# Patient Record
Sex: Female | Born: 1935 | Race: White | Hispanic: No | Marital: Married | Smoking: Former smoker
Health system: Southern US, Community
[De-identification: ages and names within clinical notes are randomized; demographics above are authoritative.]

## PROBLEM LIST (undated history)

## (undated) DIAGNOSIS — C50919 Malignant neoplasm of unspecified site of unspecified female breast: Secondary | ICD-10-CM

## (undated) DIAGNOSIS — R498 Other voice and resonance disorders: Secondary | ICD-10-CM

## (undated) DIAGNOSIS — R739 Hyperglycemia, unspecified: Secondary | ICD-10-CM

## (undated) DIAGNOSIS — R251 Tremor, unspecified: Secondary | ICD-10-CM

## (undated) DIAGNOSIS — I10 Essential (primary) hypertension: Secondary | ICD-10-CM

## (undated) DIAGNOSIS — D649 Anemia, unspecified: Secondary | ICD-10-CM

## (undated) DIAGNOSIS — R42 Dizziness and giddiness: Secondary | ICD-10-CM

## (undated) DIAGNOSIS — I951 Orthostatic hypotension: Secondary | ICD-10-CM

## (undated) DIAGNOSIS — E039 Hypothyroidism, unspecified: Secondary | ICD-10-CM

## (undated) DIAGNOSIS — E785 Hyperlipidemia, unspecified: Secondary | ICD-10-CM

## (undated) DIAGNOSIS — R7303 Prediabetes: Secondary | ICD-10-CM

## (undated) DIAGNOSIS — I519 Heart disease, unspecified: Secondary | ICD-10-CM

## (undated) DIAGNOSIS — G459 Transient cerebral ischemic attack, unspecified: Secondary | ICD-10-CM

## (undated) DIAGNOSIS — N19 Unspecified kidney failure: Secondary | ICD-10-CM

## (undated) HISTORY — PX: CATARACT EXTRACTION: SUR2

## (undated) HISTORY — PX: TONSILLECTOMY: SUR1361

## (undated) HISTORY — DX: Other voice and resonance disorders: R49.8

## (undated) HISTORY — DX: Hyperlipidemia, unspecified: E78.5

## (undated) HISTORY — DX: Malignant neoplasm of unspecified site of unspecified female breast: C50.919

## (undated) HISTORY — DX: Transient cerebral ischemic attack, unspecified: G45.9

## (undated) HISTORY — DX: Orthostatic hypotension: I95.1

## (undated) HISTORY — DX: Hyperglycemia, unspecified: R73.9

## (undated) HISTORY — DX: Dizziness and giddiness: R42

## (undated) HISTORY — DX: Anemia, unspecified: D64.9

## (undated) HISTORY — DX: Unspecified kidney failure: N19

## (undated) HISTORY — DX: Hypothyroidism, unspecified: E03.9

## (undated) HISTORY — DX: Heart disease, unspecified: I51.9

## (undated) HISTORY — DX: Tremor, unspecified: R25.1

## (undated) HISTORY — PX: NASAL SINUS SURGERY: SHX719

## (undated) HISTORY — DX: Essential (primary) hypertension: I10

---

## 1992-03-08 HISTORY — PX: CHOLECYSTECTOMY: SHX55

## 1997-09-19 ENCOUNTER — Other Ambulatory Visit: Admission: RE | Admit: 1997-09-19 | Discharge: 1997-09-19 | Payer: Self-pay | Admitting: Obstetrics and Gynecology

## 1997-11-21 ENCOUNTER — Emergency Department (HOSPITAL_COMMUNITY): Admission: EM | Admit: 1997-11-21 | Discharge: 1997-11-21 | Payer: Self-pay | Admitting: Emergency Medicine

## 1997-11-24 ENCOUNTER — Emergency Department (HOSPITAL_COMMUNITY): Admission: EM | Admit: 1997-11-24 | Discharge: 1997-11-24 | Payer: Self-pay | Admitting: Emergency Medicine

## 1997-11-27 ENCOUNTER — Encounter: Payer: Self-pay | Admitting: Cardiology

## 1997-11-27 ENCOUNTER — Inpatient Hospital Stay (HOSPITAL_COMMUNITY): Admission: AD | Admit: 1997-11-27 | Discharge: 1997-11-29 | Payer: Self-pay | Admitting: Cardiology

## 1997-12-03 ENCOUNTER — Ambulatory Visit (HOSPITAL_COMMUNITY): Admission: RE | Admit: 1997-12-03 | Discharge: 1997-12-03 | Payer: Self-pay | Admitting: Unknown Physician Specialty

## 1997-12-05 ENCOUNTER — Encounter: Payer: Self-pay | Admitting: Cardiology

## 1997-12-05 ENCOUNTER — Ambulatory Visit (HOSPITAL_COMMUNITY): Admission: RE | Admit: 1997-12-05 | Discharge: 1997-12-05 | Payer: Self-pay | Admitting: Cardiology

## 1998-11-25 ENCOUNTER — Other Ambulatory Visit: Admission: RE | Admit: 1998-11-25 | Discharge: 1998-11-25 | Payer: Self-pay | Admitting: Obstetrics and Gynecology

## 1999-01-01 ENCOUNTER — Ambulatory Visit (HOSPITAL_COMMUNITY): Admission: RE | Admit: 1999-01-01 | Discharge: 1999-01-01 | Payer: Self-pay | Admitting: *Deleted

## 1999-01-01 ENCOUNTER — Encounter (INDEPENDENT_AMBULATORY_CARE_PROVIDER_SITE_OTHER): Payer: Self-pay

## 1999-12-15 ENCOUNTER — Other Ambulatory Visit: Admission: RE | Admit: 1999-12-15 | Discharge: 1999-12-15 | Payer: Self-pay | Admitting: Obstetrics and Gynecology

## 2001-01-19 ENCOUNTER — Other Ambulatory Visit: Admission: RE | Admit: 2001-01-19 | Discharge: 2001-01-19 | Payer: Self-pay | Admitting: Obstetrics and Gynecology

## 2002-09-11 ENCOUNTER — Other Ambulatory Visit: Admission: RE | Admit: 2002-09-11 | Discharge: 2002-09-11 | Payer: Self-pay | Admitting: Obstetrics and Gynecology

## 2002-09-19 ENCOUNTER — Ambulatory Visit (HOSPITAL_COMMUNITY): Admission: RE | Admit: 2002-09-19 | Discharge: 2002-09-19 | Payer: Self-pay | Admitting: *Deleted

## 2004-07-15 ENCOUNTER — Other Ambulatory Visit: Admission: RE | Admit: 2004-07-15 | Discharge: 2004-07-15 | Payer: Self-pay | Admitting: Obstetrics and Gynecology

## 2007-06-07 HISTORY — PX: CARDIOVASCULAR STRESS TEST: SHX262

## 2008-03-08 HISTORY — PX: BREAST LUMPECTOMY: SHX2

## 2008-04-16 ENCOUNTER — Ambulatory Visit: Payer: Self-pay | Admitting: Hematology & Oncology

## 2008-04-22 ENCOUNTER — Encounter: Admission: RE | Admit: 2008-04-22 | Discharge: 2008-04-22 | Payer: Self-pay | Admitting: General Surgery

## 2008-04-23 ENCOUNTER — Ambulatory Visit: Admission: RE | Admit: 2008-04-23 | Discharge: 2008-06-10 | Payer: Self-pay | Admitting: Radiation Oncology

## 2008-05-16 ENCOUNTER — Encounter: Admission: RE | Admit: 2008-05-16 | Discharge: 2008-05-16 | Payer: Self-pay | Admitting: General Surgery

## 2008-05-20 ENCOUNTER — Ambulatory Visit (HOSPITAL_BASED_OUTPATIENT_CLINIC_OR_DEPARTMENT_OTHER): Admission: RE | Admit: 2008-05-20 | Discharge: 2008-05-20 | Payer: Self-pay | Admitting: General Surgery

## 2008-05-20 ENCOUNTER — Encounter (INDEPENDENT_AMBULATORY_CARE_PROVIDER_SITE_OTHER): Payer: Self-pay | Admitting: General Surgery

## 2008-06-26 ENCOUNTER — Ambulatory Visit: Payer: Self-pay | Admitting: Oncology

## 2008-06-26 LAB — CBC WITH DIFFERENTIAL/PLATELET
Basophils Absolute: 0 10*3/uL (ref 0.0–0.1)
Eosinophils Absolute: 0.1 10*3/uL (ref 0.0–0.5)
HGB: 12.2 g/dL (ref 11.6–15.9)
LYMPH%: 35.6 % (ref 14.0–49.7)
MCV: 98.7 fL (ref 79.5–101.0)
MONO%: 13.8 % (ref 0.0–14.0)
NEUT#: 2.8 10*3/uL (ref 1.5–6.5)
Platelets: 146 10*3/uL (ref 145–400)

## 2008-06-26 LAB — COMPREHENSIVE METABOLIC PANEL
Albumin: 4.6 g/dL (ref 3.5–5.2)
Alkaline Phosphatase: 52 U/L (ref 39–117)
BUN: 30 mg/dL — ABNORMAL HIGH (ref 6–23)
Glucose, Bld: 110 mg/dL — ABNORMAL HIGH (ref 70–99)
Total Bilirubin: 0.2 mg/dL — ABNORMAL LOW (ref 0.3–1.2)

## 2008-06-26 LAB — CANCER ANTIGEN 27.29: CA 27.29: 38 U/mL (ref 0–39)

## 2008-06-28 ENCOUNTER — Ambulatory Visit: Admission: RE | Admit: 2008-06-28 | Discharge: 2008-08-13 | Payer: Self-pay | Admitting: Radiation Oncology

## 2009-01-06 ENCOUNTER — Encounter: Admission: RE | Admit: 2009-01-06 | Discharge: 2009-03-05 | Payer: Self-pay | Admitting: Cardiology

## 2009-05-15 ENCOUNTER — Ambulatory Visit: Payer: Self-pay | Admitting: Oncology

## 2009-11-25 ENCOUNTER — Ambulatory Visit: Admission: RE | Admit: 2009-11-25 | Discharge: 2009-12-01 | Payer: Self-pay | Admitting: Radiation Oncology

## 2009-12-09 ENCOUNTER — Ambulatory Visit: Payer: Self-pay | Admitting: Cardiology

## 2010-04-23 ENCOUNTER — Ambulatory Visit (INDEPENDENT_AMBULATORY_CARE_PROVIDER_SITE_OTHER): Payer: 59 | Admitting: Cardiology

## 2010-04-23 DIAGNOSIS — I119 Hypertensive heart disease without heart failure: Secondary | ICD-10-CM

## 2010-04-23 DIAGNOSIS — G219 Secondary parkinsonism, unspecified: Secondary | ICD-10-CM

## 2010-04-23 DIAGNOSIS — E039 Hypothyroidism, unspecified: Secondary | ICD-10-CM

## 2010-04-23 DIAGNOSIS — E7800 Pure hypercholesterolemia, unspecified: Secondary | ICD-10-CM

## 2010-06-18 LAB — DIFFERENTIAL
Basophils Relative: 0 % (ref 0–1)
Lymphocytes Relative: 28 % (ref 12–46)
Lymphs Abs: 1.8 10*3/uL (ref 0.7–4.0)
Monocytes Relative: 14 % — ABNORMAL HIGH (ref 3–12)
Neutro Abs: 3.7 10*3/uL (ref 1.7–7.7)
Neutrophils Relative %: 56 % (ref 43–77)

## 2010-06-18 LAB — COMPREHENSIVE METABOLIC PANEL
BUN: 25 mg/dL — ABNORMAL HIGH (ref 6–23)
Calcium: 9.6 mg/dL (ref 8.4–10.5)
Creatinine, Ser: 1.1 mg/dL (ref 0.4–1.2)
Glucose, Bld: 81 mg/dL (ref 70–99)
Total Protein: 7.2 g/dL (ref 6.0–8.3)

## 2010-06-18 LAB — URINALYSIS, ROUTINE W REFLEX MICROSCOPIC
Bilirubin Urine: NEGATIVE
Glucose, UA: NEGATIVE mg/dL
Hgb urine dipstick: NEGATIVE
Ketones, ur: NEGATIVE mg/dL
Nitrite: NEGATIVE
Protein, ur: NEGATIVE mg/dL
Specific Gravity, Urine: 1.015 (ref 1.005–1.030)
Urobilinogen, UA: 0.2 mg/dL (ref 0.0–1.0)
pH: 7 (ref 5.0–8.0)

## 2010-06-18 LAB — CBC
HCT: 35.5 % — ABNORMAL LOW (ref 36.0–46.0)
Hemoglobin: 12.4 g/dL (ref 12.0–15.0)
MCHC: 35 g/dL (ref 30.0–36.0)
Platelets: 161 10*3/uL (ref 150–400)
RDW: 14.1 % (ref 11.5–15.5)

## 2010-06-18 LAB — URINE MICROSCOPIC-ADD ON

## 2010-07-21 NOTE — Op Note (Signed)
Victoria Lindsey, Victoria Lindsey                ACCOUNT NO.:  [id number]   MEDICAL RECORD NO.:  [id number]          PATIENT TYPE:  AMB   LOCATION:  DSC                          FACILITY:  [facility]   PHYSICIAN:  Angelia Mould. Derrell Lolling, M.D.DATE OF BIRTH:  1926-01-29   DATE OF PROCEDURE:  05/20/2008  DATE OF DISCHARGE:                               OPERATIVE REPORT   PREOPERATIVE DIAGNOSIS:  Invasive ductal carcinoma, left breast, upper  outer quadrant, clinical stage T1c N0.   POSTOPERATIVE DIAGNOSIS:  Invasive ductal carcinoma, left breast, upper  outer quadrant, clinical stage T1c N0.   OPERATION PERFORMED:  1. Injection of blue dye into left breast.  2. Left partial mastectomy with needle localization.  3. Left axillary sentinel lymph node biopsy.   SURGEON:  Angelia Mould. Derrell Lolling, MD   OPERATIVE INDICATIONS:  This is an 77 year old Caucasian woman who has  no prior history of breast problems.  She recently had routine screening  mammograms and ultrasound and found a 1-cm irregular area of distortion  in the upper outer quadrant of the left breast.  BSGI and MRI showed  this to be an isolated finding.  Image-guided biopsy showed invasive  ductal carcinoma with tubular features, receptor positive.  HER2  negative.  I have counseled her as an outpatient.  Options have been  discussed.  We have elected to go ahead with left partial mastectomy and  left sentinel lymph node biopsy and probable postop radiation therapy.   OPERATIVE TECHNIQUE:  The patient underwent wire localization by Dr.  Jeralyn Ruths today and the wire went through the marker clip in the  upper outer quadrant of the left breast.  The patient was then brought  to [facility] and underwent injection of radionuclide into  the left breast by the nuclear medicine technician.  The patient was  taken to the operating room.  General anesthesia was induced.  The  patient was identified as correct patient, correct procedure,  and  correct site.  Following an alcohol prep, I injected 5 mL of blue dye in  the left subareolar area, 2 mL of methylene blue mixed with 3 mL of  saline.  We massaged the breast for 5 minutes.  The left breast,  shoulder, axilla and chest wall were then prepped and draped in a  sterile fashion.  Intravenous antibiotics were given.  Marcaine 0.5%  with epinephrine was used as a local infiltration anesthetic.   I observed the wire localization coming into the lateral aspect of the  breast and directed superomedially.  I made a curved incision in the  upper outer quadrant of the left breast over where I thought the tumor  was.  Dissection was carried down into the breast tissue and around the  localizing wire and around the area where I felt the tumor was.  This  was felt to be somewhat posterior and so I went all the way down to the  pectoralis fascia in some areas.  The specimen was removed.  I marked  the specimen with the 6-color margin marker kit.  The breast specimen  was sent across the street to Dr. Yolanda Bonine and she said that the marker  was in the center of the specimen and that it looked very good.  This  was then sent for routine histology.  Hemostasis was excellent and  achieved with electrocautery.  Wound was irrigated with saline.   I then packed the breast wound and directed my attention to the left  axilla.  I used a NeoProbe.  I found one very hot spot in the axilla.  The incision was made in the left axilla in the skin crease.  Dissection  was carried down through subcutaneous tissue.  Using the NeoProbe, I  continued my dissection deeper through the clavipectoral fascia until I  entered the left axillary space.  I found one very hot, very blue, very  small lymph node.  This was removed and then after this was removed,  there was absolutely no radioactivity in the axilla.  This was sent for  routine analysis.  The axillary wound was irrigated with saline.  Hemostasis  was excellent and achieved with electrocautery.   Both the breast and axillary wound were closed in layers with 3-0 Vicryl  sutures and then 4-0 Monocryl running subcuticular sutures of the skin  and Steri-Strips.  Clean bandages were placed and the patient was taken  to the recovery room in stable condition.  Estimated blood loss was  about 15-20 mL.  Complications none.  Sponge, needle, and instrument  counts were correct.       Angelia Mould. Derrell Lolling, M.D.  Electronically Signed     HMI/MEDQ  D:  05/20/2008  T:  05/21/2008  Job:  [id number]   cc:   Jeralyn Ruths, MD  Synetta Fail, MD

## 2010-08-25 ENCOUNTER — Telehealth: Payer: Self-pay | Admitting: Cardiology

## 2010-08-25 NOTE — Telephone Encounter (Signed)
Returned call and patient would like to get generic plavix.  Called patient to discuss secondary to fax received from [facility]

## 2010-08-25 NOTE — Telephone Encounter (Signed)
Returned your phone call that was made last Friday. Just got into town please call back. I could not find the chart.

## 2010-08-26 ENCOUNTER — Encounter: Payer: Self-pay | Admitting: Cardiology

## 2010-08-31 ENCOUNTER — Telehealth: Payer: Self-pay | Admitting: *Deleted

## 2010-08-31 ENCOUNTER — Other Ambulatory Visit: Payer: Self-pay | Admitting: *Deleted

## 2010-08-31 DIAGNOSIS — Z79899 Other long term (current) drug therapy: Secondary | ICD-10-CM

## 2010-08-31 DIAGNOSIS — E785 Hyperlipidemia, unspecified: Secondary | ICD-10-CM

## 2010-08-31 NOTE — Telephone Encounter (Signed)
Advised patient ok for generic plavix and faxed ok to [facility]

## 2010-09-01 ENCOUNTER — Other Ambulatory Visit: Payer: Self-pay | Admitting: Cardiology

## 2010-09-01 ENCOUNTER — Other Ambulatory Visit (INDEPENDENT_AMBULATORY_CARE_PROVIDER_SITE_OTHER): Payer: Medicare Other | Admitting: *Deleted

## 2010-09-01 ENCOUNTER — Encounter: Payer: Self-pay | Admitting: Cardiology

## 2010-09-01 ENCOUNTER — Ambulatory Visit (INDEPENDENT_AMBULATORY_CARE_PROVIDER_SITE_OTHER): Payer: Medicare Other | Admitting: Cardiology

## 2010-09-01 DIAGNOSIS — G20 Parkinson's disease: Secondary | ICD-10-CM | POA: Insufficient documentation

## 2010-09-01 DIAGNOSIS — I119 Hypertensive heart disease without heart failure: Secondary | ICD-10-CM

## 2010-09-01 DIAGNOSIS — E039 Hypothyroidism, unspecified: Secondary | ICD-10-CM

## 2010-09-01 DIAGNOSIS — Z79899 Other long term (current) drug therapy: Secondary | ICD-10-CM

## 2010-09-01 DIAGNOSIS — D649 Anemia, unspecified: Secondary | ICD-10-CM

## 2010-09-01 DIAGNOSIS — E785 Hyperlipidemia, unspecified: Secondary | ICD-10-CM

## 2010-09-01 DIAGNOSIS — E7800 Pure hypercholesterolemia, unspecified: Secondary | ICD-10-CM

## 2010-09-01 LAB — CBC WITH DIFFERENTIAL/PLATELET
Basophils Relative: 0.3 % (ref 0.0–3.0)
Hemoglobin: 11.3 g/dL — ABNORMAL LOW (ref 12.0–15.0)
Lymphocytes Relative: 25.7 % (ref 12.0–46.0)
MCHC: 33.7 g/dL (ref 30.0–36.0)
Monocytes Relative: 15.2 % — ABNORMAL HIGH (ref 3.0–12.0)
Neutro Abs: 3.4 10*3/uL (ref 1.4–7.7)
RBC: 3.35 Mil/uL — ABNORMAL LOW (ref 3.87–5.11)

## 2010-09-01 LAB — BASIC METABOLIC PANEL
CO2: 27 mEq/L (ref 19–32)
Chloride: 104 mEq/L (ref 96–112)
GFR: 35.28 mL/min — ABNORMAL LOW (ref >60.00)
Glucose, Bld: 101 mg/dL — ABNORMAL HIGH (ref 70–99)
Potassium: 5 mEq/L (ref 3.5–5.1)
Sodium: 137 mEq/L (ref 135–145)

## 2010-09-01 LAB — HEPATIC FUNCTION PANEL
ALT: 35 U/L (ref 0–35)
AST: 39 U/L — ABNORMAL HIGH (ref 0–37)
Albumin: 4.5 g/dL (ref 3.5–5.2)
Alkaline Phosphatase: 40 U/L (ref 39–117)

## 2010-09-01 LAB — LIPID PANEL: VLDL: 41.2 mg/dL — ABNORMAL HIGH (ref 0.0–40.0)

## 2010-09-01 NOTE — Assessment & Plan Note (Signed)
Patient has a history of hypercholesterolemia.  She is on low dose Crestor.  She's not having any myalgias from the Crestor.

## 2010-09-01 NOTE — Assessment & Plan Note (Signed)
The patient has a history of Parkinson's disease.  She is followed by her neurologist Dr. Craige Cotta in [facility].  Presently she is on Azilect  and possibly Mirapex.  She's not sure.She has noted some improvement in her locomotion since going on the Parkinson's medication.  She still feels somewhat unsteady.  She's also been told that she had some vestibular problems.

## 2010-09-01 NOTE — Assessment & Plan Note (Signed)
The patient has a long history of hypertensive cardiovascular disease.  Since last visit she's been feeling well.  She has not been experiencing any headaches.  She notes occasional dizziness.  She has not had syncope.

## 2010-09-01 NOTE — Progress Notes (Signed)
Victoria Lindsey Date of Birth:  Apr 04, 1925 [facility] / [facility] [address]   [address] [place], [place]  [address] [phone]           Fax   [phone]  History of Present Illness: This pleasant 77 year old woman is seen for a scheduled 4 month followup office visit.  She has a long history of essential hypertension.  She does not have any history of known coronary disease.  She had a normal nuclear stress test in 2009.  She has not been expressing any exertional chest pain or angina.  She does have a history of Parkinson's disease which has slowed her up.  She did not tolerate carbidopa levodopa and is now on Azilect And/or  Mirapex, she is not sure.She has been experiencing some lightheaded spells.  He has not had syncope.  She has a past history of a TIA.  She had a echocardiogram in 2007 which showed midcavity left ventricular outflow tract obstruction and normal pulmonary artery pressure and normal LV systolic function with impaired relaxation.  She also had a negative bubble study for TIA at that time.  Current Outpatient Prescriptions  Medication Sig Dispense Refill  . aspirin 81 MG tablet Take 81 mg by mouth daily.        Marland Kitchen BIOTIN PO Take by mouth daily.        . Calcium-Vitamin D-Vitamin K (CALCIUM + D + K PO) Take by mouth daily.        . clopidogrel (PLAVIX) 75 MG tablet Take 75 mg by mouth daily.        . Cranberry 500 MG CAPS Take by mouth.        . fenofibrate 54 MG tablet Take 54 mg by mouth daily.        . fish oil-omega-3 fatty acids 1000 MG capsule Take 1 g by mouth daily.        Marland Kitchen levothyroxine (SYNTHROID, LEVOTHROID) 88 MCG tablet Take 88 mcg by mouth daily.        . Multiple Vitamin (MULTIVITAMIN) tablet Take 1 tablet by mouth daily.        . nadolol (CORGARD) 40 MG tablet Take 40 mg by mouth daily.        . nitrofurantoin (MACRODANTIN) 50 MG capsule Take 50 mg by mouth at bedtime.        . Nutritional Supplements (ENSURE PO) Take by mouth daily.         . pramipexole (MIRAPEX) 1 MG tablet Take 1 mg by mouth daily.        . rasagiline (AZILECT) 0.5 MG TABS Take 1 mg by mouth daily.        . rosuvastatin (CRESTOR) 10 MG tablet Take 5 mg by mouth daily.        Marland Kitchen spironolactone (ALDACTONE) 25 MG tablet Take 25 mg by mouth daily.        . vitamin C (ASCORBIC ACID) 500 MG tablet Take 500 mg by mouth daily.          Allergies  Allergen Reactions  . Cozaar   . Diltiazem   . Norvasc (Amlodipine Besylate)   . Penicillins   . Pepcid   . Protonix   . Sulfa Drugs Cross Reactors     Patient Active Problem List  Diagnoses  . Benign hypertensive heart disease without heart failure  . Parkinson's disease  . Hypothyroidism  . Hypercholesterolemia  . Chronic anemia    History  Smoking  status  . Former Smoker  . Quit date: 08/26/1970  Smokeless tobacco  . Not on file    History  Alcohol Use No    Family History  Problem Relation Age of Onset  . Heart disease Mother   . Heart failure Father     Review of Systems: Constitutional: no fever chills diaphoresis or fatigue or change in weight.  Head and neck: no hearing loss, no epistaxis, no photophobia or visual disturbance. Respiratory: No cough, shortness of breath or wheezing. Cardiovascular: No chest pain peripheral edema, palpitations. Gastrointestinal: No abdominal distention, no abdominal pain, no change in bowel habits hematochezia or melena. Genitourinary: No dysuria, no frequency, no urgency, no nocturia. Musculoskeletal:No arthralgias, no back pain, no gait disturbance or myalgias. Neurological: No  headaches, no numbness, no seizures, Occasional dizziness.  No significant resting tremor. Hematologic: No lymphadenopathy, no easy bruising. Psychiatric: No confusion, no hallucinations, no sleep disturbance.    Physical Exam: Filed Vitals:   09/01/10 1004  BP: 130/70  Pulse: 80   The general appearance reveals a well-developed well-nourished woman in no  distress.  We checked her blood pressure supine and standing and There was no orthostatic hypotension.The head and neck exam reveals pupils equal and reactive.  Extraocular movements are full.  There is no scleral icterus.  The mouth and pharynx are normal.  The neck is supple.  The carotids reveal no bruits.  The jugular venous pressure is normal.  The  thyroid is not enlarged.  There is no lymphadenopathy.  The chest is clear to percussion and auscultation.  There are no rales or rhonchi.  Expansion of the chest is symmetrical.  The precordium is quiet.  The first heart sound is normal.  The second heart sound is physiologically split.  There is no murmur gallop rub or click.  There is no abnormal lift or heave.  The abdomen is soft and nontender.  The bowel sounds are normal.  The liver and spleen are not enlarged.  There are no abdominal masses.  There are no abdominal bruits.  Extremities reveal good pedal pulses.  There is no phlebitis or edema.  There is no cyanosis or clubbing.  Strength is normal and symmetrical in all extremities.  There is no lateralizing weakness.  There are no sensory deficits.  The skin is warm and dry.  There is no rash.  Assessment / Plan: Continue present medication.  Continue careful diet but watch carbohydrates.  Recheck in 4 months for followup office visit fasting lipid panel hepatic function panel and basal metabolic panel

## 2010-09-03 ENCOUNTER — Telehealth: Payer: Self-pay | Admitting: *Deleted

## 2010-09-03 NOTE — Telephone Encounter (Signed)
Advised spouse of lab work and to decrease spironolactone to 1/2 daily and drink more water

## 2010-12-10 ENCOUNTER — Other Ambulatory Visit (INDEPENDENT_AMBULATORY_CARE_PROVIDER_SITE_OTHER): Payer: Medicare Other | Admitting: *Deleted

## 2010-12-10 DIAGNOSIS — E785 Hyperlipidemia, unspecified: Secondary | ICD-10-CM

## 2010-12-10 DIAGNOSIS — Z79899 Other long term (current) drug therapy: Secondary | ICD-10-CM

## 2010-12-10 LAB — BASIC METABOLIC PANEL
Calcium: 9.8 mg/dL (ref 8.4–10.5)
Creatinine, Ser: 1.4 mg/dL — ABNORMAL HIGH (ref 0.4–1.2)

## 2010-12-10 LAB — HEPATIC FUNCTION PANEL
AST: 32 U/L (ref 0–37)
Albumin: 4.4 g/dL (ref 3.5–5.2)
Alkaline Phosphatase: 40 U/L (ref 39–117)
Total Protein: 7.5 g/dL (ref 6.0–8.3)

## 2010-12-10 LAB — LIPID PANEL
Cholesterol: 128 mg/dL (ref 0–200)
Triglycerides: 195 mg/dL — ABNORMAL HIGH (ref 0.0–149.0)

## 2010-12-17 ENCOUNTER — Encounter: Payer: Self-pay | Admitting: Cardiology

## 2010-12-17 ENCOUNTER — Ambulatory Visit (INDEPENDENT_AMBULATORY_CARE_PROVIDER_SITE_OTHER): Payer: Medicare Other | Admitting: Cardiology

## 2010-12-17 VITALS — BP 122/67 | HR 78 | Ht 61.0 in | Wt 126.0 lb

## 2010-12-17 DIAGNOSIS — I119 Hypertensive heart disease without heart failure: Secondary | ICD-10-CM

## 2010-12-17 DIAGNOSIS — M3500 Sicca syndrome, unspecified: Secondary | ICD-10-CM | POA: Insufficient documentation

## 2010-12-17 DIAGNOSIS — E7800 Pure hypercholesterolemia, unspecified: Secondary | ICD-10-CM

## 2010-12-17 DIAGNOSIS — D649 Anemia, unspecified: Secondary | ICD-10-CM

## 2010-12-17 DIAGNOSIS — G459 Transient cerebral ischemic attack, unspecified: Secondary | ICD-10-CM

## 2010-12-17 NOTE — Assessment & Plan Note (Signed)
The patient is on Crestor 10 mg daily for her hypercholesterolemia.  She has not had any adverse effects from the statin therapy.  Her blood work is improved.

## 2010-12-17 NOTE — Assessment & Plan Note (Signed)
Patient has a remote history of suspected TIA.  She has been on long-term Plavix.  She's had no recent symptoms to suggest TIA

## 2010-12-17 NOTE — Progress Notes (Signed)
Victoria Lindsey Date of Birth:  Nov 06, 1925 [facility] / [facility] [address]   [address] [place], [place]  [address] [phone]           Fax   [phone]  History of Present Illness: This pleasant 77 year old, Caucasian female is seen for a scheduled four-month followup office visit.  She has a long history of essential hypertension.  She does not have any history of known coronary artery disease.  She had a normal nuclear stress test in 2009.  She has exertional dyspnea, but no chest pain.  The patient has a history of Parkinson's disease.  She sees Dr. Craige Cotta in [facility] for her Parkinson's.  The patient also has developed Sjogren's syndrome and sees Dr. Corliss Skains.  The patient has had a good response to the addition of pilocarpine 5 mg tablets, taking one half tablet daily.  The patient also has a history of stage III chronic kidney disease and has an appointment to see Dr. Kathrene Bongo at [facility] soon.  The patient does have a history of anemia, and she had recent extensive blood work at her rheumatologist office, which included an elevated sedimentation rate, and positive antinuclear antibody.  Current Outpatient Prescriptions  Medication Sig Dispense Refill  . aspirin 81 MG tablet Take 81 mg by mouth daily.        Marland Kitchen BIOTIN PO Take by mouth daily.        . Calcium-Vitamin D-Vitamin K (CALCIUM + D + K PO) Take by mouth daily.        . clopidogrel (PLAVIX) 75 MG tablet Take 75 mg by mouth daily.        . Cranberry 500 MG CAPS Take by mouth.        . fenofibrate 54 MG tablet Take 54 mg by mouth daily.        . fish oil-omega-3 fatty acids 1000 MG capsule Take 1 g by mouth daily.        Marland Kitchen levothyroxine (SYNTHROID, LEVOTHROID) 88 MCG tablet Take 88 mcg by mouth daily.        . Multiple Vitamin (MULTIVITAMIN) tablet Take 1 tablet by mouth daily.        . nadolol (CORGARD) 40 MG tablet Take 40 mg by mouth daily.        . Nutritional Supplements (ENSURE PO)  Take by mouth daily.        . pilocarpine (SALAGEN) 5 MG tablet 5 mg as directed. 1/2 tablet daily       . rasagiline (AZILECT) 0.5 MG TABS Take 1 mg by mouth daily.        . rosuvastatin (CRESTOR) 10 MG tablet Take 5 mg by mouth daily.        Marland Kitchen spironolactone (ALDACTONE) 25 MG tablet Take 25 mg by mouth daily.        . vitamin C (ASCORBIC ACID) 500 MG tablet Take 500 mg by mouth daily.          Allergies  Allergen Reactions  . Aciphex (Rabeprazole Sodium)   . Amantadines   . Avapro (Irbesartan)   . Carbidopa-Levodopa   . Cardura (Doxazosin Mesylate)   . Ciprofloxacin   . Claritin   . Clonidine Derivatives   . Cozaar   . Diltiazem   . Hctz (Hydrochlorothiazide)   . Indapamide   . Maxidex (Dexamethasone)   . Mirapex (Pramipexole Dihydrochloride)   . Norvasc (Amlodipine Besylate)   . Penicillins   . Pepcid   .  Prevacid   . Prilosec (Omeprazole)   . Procardia (Nifedipine)   . Proton Pump Inhibitors   . Protonix   . Sulfa Drugs Cross Reactors   . Trimethoprim   . Zyrtec (Cetirizine Hcl)     Patient Active Problem List  Diagnoses  . Benign hypertensive heart disease without heart failure  . Parkinson's disease  . Hypothyroidism  . Hypercholesterolemia  . Chronic anemia    History  Smoking status  . Former Smoker  . Quit date: 08/26/1970  Smokeless tobacco  . Not on file    History  Alcohol Use No    Family History  Problem Relation Age of Onset  . Heart disease Mother   . Heart failure Father     Review of Systems: Constitutional: no fever chills diaphoresis or fatigue or change in weight.  Head and neck: no hearing loss, no epistaxis, no photophobia or visual disturbance. Respiratory: No cough, shortness of breath or wheezing. Cardiovascular: No chest pain peripheral edema, palpitations. Gastrointestinal: No abdominal distention, no abdominal pain, no change in bowel habits hematochezia or melena. Genitourinary: No dysuria, no frequency, no urgency,  no nocturia. Musculoskeletal:No arthralgias, no back pain, no gait disturbance or myalgias. Neurological: No dizziness, no headaches, no numbness, no seizures, no syncope, no weakness, no tremors. Hematologic: No lymphadenopathy, no easy bruising. Psychiatric: No confusion, no hallucinations, no sleep disturbance.    Physical Exam: Filed Vitals:   12/17/10 1126  BP: 122/67  Pulse: 78   General appearance reveals an elderly, pleasant, alert woman in no acute distress.Pupils equal and reactive.   Extraocular Movements are full.  There is no scleral icterus.  The mouth and pharynx are normal.  The neck is supple.  The carotids reveal no bruits.  The jugular venous pressure is normal.  The thyroid is not enlarged.  There is no lymphadenopathy. The chest is clear to percussion and auscultation. There are no rales or rhonchi. Expansion of the chest is symmetrical.  The precordium is quiet.  The first heart sound is normal.  The second heart sound is physiologically split.  There is no murmur gallop rub or click.  There is no abnormal lift or heave.  The abdomen is soft and nontender. Bowel sounds are normal. The liver and spleen are not enlarged. There Are no abdominal masses. There are no bruits.  The pedal pulses are good.  There is no phlebitis or edema.  There is no cyanosis or clubbing. Neurologic exam reveals evidence of Parkinson's disease.The skin is warm and dry.  There is no rash.   Assessment / Plan Continue same medications.  Recheck in 4 months for followup office visit and lab work

## 2010-12-17 NOTE — Assessment & Plan Note (Signed)
Patient has a long history of essential hypertension.  She has not been experiencing any dizzy spells or syncope.  She does have exertional dyspnea, which is chronic and may be exacerbated by her chronic anemia.

## 2010-12-17 NOTE — Patient Instructions (Addendum)
continue same dose of medications  Your physician wants you to follow-up in: 4 months You will receive a reminder letter in the mail two months in advance. If you don't receive a letter, please call our office to schedule the follow-up appointment.  

## 2010-12-17 NOTE — Assessment & Plan Note (Signed)
The patient has had a very good response to low-dose pilocarpine.  Her symptoms of dry mouth, and dry.  Eyes have improved.

## 2010-12-28 ENCOUNTER — Other Ambulatory Visit: Payer: Self-pay | Admitting: Cardiology

## 2010-12-29 ENCOUNTER — Other Ambulatory Visit: Payer: Self-pay | Admitting: Cardiology

## 2010-12-29 MED ORDER — SPIRONOLACTONE 25 MG PO TABS: 25.0000 mg | ORAL_TABLET | ORAL | Status: DC

## 2011-01-14 ENCOUNTER — Other Ambulatory Visit: Payer: Self-pay | Admitting: *Deleted

## 2011-01-14 ENCOUNTER — Other Ambulatory Visit: Payer: Self-pay | Admitting: Cardiology

## 2011-01-14 MED ORDER — NADOLOL 40 MG PO TABS: 40.0000 mg | ORAL_TABLET | Freq: Two times a day (BID) | ORAL | Status: DC

## 2011-01-14 MED ORDER — CLOPIDOGREL BISULFATE 75 MG PO TABS: 75.0000 mg | ORAL_TABLET | Freq: Every day | ORAL | Status: DC

## 2011-03-09 LAB — HM MAMMOGRAPHY: HM MAMMO: NORMAL

## 2011-03-09 LAB — HM DEXA SCAN

## 2011-03-27 ENCOUNTER — Other Ambulatory Visit: Payer: Self-pay | Admitting: Cardiology

## 2011-03-29 NOTE — Telephone Encounter (Signed)
Refilled crestor 

## 2011-04-20 ENCOUNTER — Encounter: Payer: Self-pay | Admitting: Cardiology

## 2011-04-20 ENCOUNTER — Other Ambulatory Visit (INDEPENDENT_AMBULATORY_CARE_PROVIDER_SITE_OTHER): Payer: Medicare Other | Admitting: *Deleted

## 2011-04-20 ENCOUNTER — Ambulatory Visit (INDEPENDENT_AMBULATORY_CARE_PROVIDER_SITE_OTHER): Payer: Medicare Other | Admitting: Cardiology

## 2011-04-20 VITALS — BP 138/65 | HR 73 | Ht 61.0 in | Wt 126.0 lb

## 2011-04-20 DIAGNOSIS — E7800 Pure hypercholesterolemia, unspecified: Secondary | ICD-10-CM

## 2011-04-20 DIAGNOSIS — D649 Anemia, unspecified: Secondary | ICD-10-CM

## 2011-04-20 DIAGNOSIS — E785 Hyperlipidemia, unspecified: Secondary | ICD-10-CM

## 2011-04-20 DIAGNOSIS — I119 Hypertensive heart disease without heart failure: Secondary | ICD-10-CM

## 2011-04-20 DIAGNOSIS — M3500 Sicca syndrome, unspecified: Secondary | ICD-10-CM

## 2011-04-20 LAB — CBC WITH DIFFERENTIAL/PLATELET
Basophils Absolute: 0 10*3/uL (ref 0.0–0.1)
HCT: 33.8 % — ABNORMAL LOW (ref 36.0–46.0)
Hemoglobin: 11.2 g/dL — ABNORMAL LOW (ref 12.0–15.0)
Lymphs Abs: 1.8 10*3/uL (ref 0.7–4.0)
MCHC: 33.2 g/dL (ref 30.0–36.0)
MCV: 100.3 fl — ABNORMAL HIGH (ref 78.0–100.0)
Monocytes Absolute: 0.9 10*3/uL (ref 0.1–1.0)
Monocytes Relative: 14.5 % — ABNORMAL HIGH (ref 3.0–12.0)
Neutro Abs: 3.1 10*3/uL (ref 1.4–7.7)
Platelets: 138 10*3/uL — ABNORMAL LOW (ref 150.0–400.0)
RDW: 15 % — ABNORMAL HIGH (ref 11.5–14.6)

## 2011-04-20 MED ORDER — METOPROLOL TARTRATE 25 MG PO TABS: 25.0000 mg | ORAL_TABLET | Freq: Two times a day (BID) | ORAL | Status: DC

## 2011-04-20 NOTE — Assessment & Plan Note (Signed)
The patient has a history of chronic anemia.  This is most likely secondary to her chronic renal insufficiency.  Does cause her to have lack of energy.  We are checking a CBC today

## 2011-04-20 NOTE — Patient Instructions (Addendum)
Stop Nadolol and start Metoprolol 25 mg twice daily, Rx sent to [facility].  Will obtain labs today and call you with the results  Your physician recommends that you schedule a follow-up appointment in: 4 months with fasting labs (lp/bmet/hfp) and ekg

## 2011-04-20 NOTE — Assessment & Plan Note (Signed)
The patient reports that she is entering any Sjogren's disease study involved in treating the symptomatic dry eyes syndrome.

## 2011-04-20 NOTE — Assessment & Plan Note (Signed)
The blood pressure has been running borderline high since last visit she recently was placed on lisinopril 2.5 mg daily with improvement.  She is tolerating the lisinopril with no cough or adverse side effects so far.  She had recent blood work at her primary care physician's office.

## 2011-04-20 NOTE — Progress Notes (Signed)
[place] Date of Birth:  03-Jun-1925 [facility] [address] [place], [place]  [address] [phone]         Fax   [phone]  History of Present Illness: This pleasant 78 year old woman is seen for a four-month followup office visit.  She has a long history of essential hypertension.  She does not have any history of ischemic heart disease.  She had a normal nuclear stress test in 2009.  She has a history of exertional dyspnea.  She has a history of Parkinson's disease and also a history of Sjogren's syndrome.  She's had stage III chronic kidney disease.  Current Outpatient Prescriptions  Medication Sig Dispense Refill  . lisinopril (PRINIVIL,ZESTRIL) 2.5 MG tablet Take 2.5 mg by mouth daily.      Marland Kitchen aspirin 81 MG tablet Take 81 mg by mouth daily.        Marland Kitchen BIOTIN PO Take by mouth daily.        . Calcium-Vitamin D-Vitamin K (CALCIUM + D + K PO) Take by mouth daily.        . clopidogrel (PLAVIX) 75 MG tablet Take 1 tablet (75 mg total) by mouth daily.  30 tablet  6  . Cranberry 500 MG CAPS Take by mouth.        . CRESTOR 10 MG tablet TAKE 1 TABLET BY MOUTH EVERY DAY  30 tablet  0  . fenofibrate 54 MG tablet Take 54 mg by mouth 2 (two) times daily.       . fish oil-omega-3 fatty acids 1000 MG capsule Take 1 g by mouth daily.        Marland Kitchen levothyroxine (SYNTHROID, LEVOTHROID) 88 MCG tablet Take 88 mcg by mouth daily.        . metoprolol tartrate (LOPRESSOR) 25 MG tablet Take 1 tablet (25 mg total) by mouth 2 (two) times daily.  60 tablet  11  . Multiple Vitamin (MULTIVITAMIN) tablet Take 1 tablet by mouth daily.        . Nutritional Supplements (ENSURE PO) Take by mouth daily.        . pilocarpine (SALAGEN) 5 MG tablet 5 mg as directed. 1/2 tablet daily       . rasagiline (AZILECT) 0.5 MG TABS Take 1 mg by mouth daily.        Marland Kitchen spironolactone (ALDACTONE) 25 MG tablet Take 1 tablet (25 mg total) by mouth as directed.  60 tablet  4  . vitamin C (ASCORBIC ACID) 500  MG tablet Take 500 mg by mouth daily.          Allergies  Allergen Reactions  . Aciphex (Rabeprazole Sodium)   . Amantadines   . Avapro (Irbesartan)   . Carbidopa-Levodopa   . Cardura (Doxazosin Mesylate)   . Ciprofloxacin   . Claritin   . Clonidine Derivatives   . Cozaar   . Diltiazem   . Hctz (Hydrochlorothiazide)   . Indapamide   . Maxidex (Dexamethasone)   . Mirapex (Pramipexole Dihydrochloride)   . Norvasc (Amlodipine Besylate)   . Penicillins   . Pepcid   . Prevacid   . Prilosec (Omeprazole)   . Procardia (Nifedipine)   . Proton Pump Inhibitors   . Protonix   . Sulfa Drugs Cross Reactors   . Trimethoprim   . Zyrtec (Cetirizine Hcl)     Patient Active Problem List  Diagnoses  . Benign hypertensive heart disease without heart failure  . Parkinson's disease  . Hypothyroidism  .  Hypercholesterolemia  . Chronic anemia  . Sjogren's syndrome  . TIA (transient ischemic attack)    History  Smoking status  . Former Smoker  . Quit date: 08/26/1970  Smokeless tobacco  . Not on file    History  Alcohol Use No    Family History  Problem Relation Age of Onset  . Heart disease Mother   . Heart failure Father     Review of Systems: Constitutional: no fever chills diaphoresis or fatigue or change in weight.  Head and neck: no hearing loss, no epistaxis, no photophobia or visual disturbance. Respiratory: No cough, shortness of breath or wheezing. Cardiovascular: No chest pain peripheral edema, palpitations. Gastrointestinal: No abdominal distention, no abdominal pain, no change in bowel habits hematochezia or melena. Genitourinary: No dysuria, no frequency, no urgency, no nocturia. Musculoskeletal:No arthralgias, no back pain, no gait disturbance or myalgias. Neurological: No dizziness, no headaches, no numbness, no seizures, no syncope, no weakness, no tremors. Hematologic: No lymphadenopathy, no easy bruising. Psychiatric: No confusion, no hallucinations,  no sleep disturbance.    Physical Exam: Filed Vitals:   04/20/11 0923  BP: 138/65  Pulse: 73   The general appearance reveals a well-developed well-nourished elderly woman in no distress.Pupils equal and reactive.   Extraocular Movements are full.  There is no scleral icterus.  The mouth and pharynx are normal.  The neck is supple.  The carotids reveal no bruits.  The jugular venous pressure is normal.  The thyroid is not enlarged.  There is no lymphadenopathy.  The chest is clear to percussion and auscultation. There are no rales or rhonchi. Expansion of the chest is symmetrical.  Heart reveals a soft basilar systolic murmur.  No diastolic murmur.  No gallop or rub.The abdomen is soft and nontender. Bowel sounds are normal. The liver and spleen are not enlarged. There Are no abdominal masses. There are no bruits.  The pedal pulses are good.  There is no phlebitis or edema.  There is no cyanosis or clubbing. Strength is normal and symmetrical in all extremities.  There is no lateralizing weakness.  There are no sensory deficits.    Assessment / Plan: The patient is to continue present medication.  The cost of her native LAD is increasing in her insurance plan and for this reason we are going to switch her to metoprolol 25 mg twice a day and stop nadolol when she runs out.  Recheck in 4 months for followup office visit and EKG

## 2011-04-20 NOTE — Assessment & Plan Note (Signed)
The patient has a history of dyslipidemia.  Recently her fenofibrate was increased to 2 tablets a day.  She is not having any myalgias from the fenofibrate at this point.

## 2011-04-21 ENCOUNTER — Telehealth: Payer: Self-pay | Admitting: *Deleted

## 2011-04-21 NOTE — Telephone Encounter (Signed)
Message copied by Burnell Blanks on Wed Apr 21, 2011  5:19 PM ------      Message from: Cassell Clement      Created: Tue Apr 20, 2011  2:16 PM       Please report.  Anemia is stable since last summer.  Hgb 11.2      CSD

## 2011-04-21 NOTE — Telephone Encounter (Signed)
Advised of labs 

## 2011-05-24 ENCOUNTER — Other Ambulatory Visit: Payer: Self-pay | Admitting: Cardiology

## 2011-05-24 NOTE — Telephone Encounter (Signed)
Refilled crestor 

## 2011-08-17 ENCOUNTER — Other Ambulatory Visit: Payer: Self-pay | Admitting: Cardiology

## 2011-08-17 NOTE — Telephone Encounter (Signed)
Fax Received. Refill Completed. Kalieb Freeland Chowoe (R.M.A)   

## 2011-08-18 ENCOUNTER — Ambulatory Visit (INDEPENDENT_AMBULATORY_CARE_PROVIDER_SITE_OTHER): Payer: Medicare Other | Admitting: Cardiology

## 2011-08-18 ENCOUNTER — Encounter: Payer: Self-pay | Admitting: Cardiology

## 2011-08-18 VITALS — BP 118/78 | HR 80 | Ht 61.0 in | Wt 125.0 lb

## 2011-08-18 DIAGNOSIS — I119 Hypertensive heart disease without heart failure: Secondary | ICD-10-CM

## 2011-08-18 DIAGNOSIS — I519 Heart disease, unspecified: Secondary | ICD-10-CM

## 2011-08-18 DIAGNOSIS — R5381 Other malaise: Secondary | ICD-10-CM

## 2011-08-18 DIAGNOSIS — R5383 Other fatigue: Secondary | ICD-10-CM

## 2011-08-18 DIAGNOSIS — E7800 Pure hypercholesterolemia, unspecified: Secondary | ICD-10-CM

## 2011-08-18 DIAGNOSIS — E039 Hypothyroidism, unspecified: Secondary | ICD-10-CM

## 2011-08-18 MED ORDER — METOPROLOL TARTRATE 25 MG PO TABS: 25.0000 mg | ORAL_TABLET | Freq: Two times a day (BID) | ORAL | Status: DC

## 2011-08-18 NOTE — Progress Notes (Signed)
[place] Date of Birth:  08-Nov-1925 [facility] [address] [place], [place]  [address] [phone]         Fax   [phone]  History of Present Illness: This pleasant 78 year old woman is seen for a scheduled four-month followup office visit.  He has a history of essential hypertension.  She has a past history of a TIA and is on aspirin and Plavix.  She has a history of Parkinson's disease and a history of Sjogren's syndrome.  She does not have any known ischemic heart disease and she had a normal nuclear stress test in 2009.  She has stage III chronic kidney disease.  She also had problems with her urinary bladder.  Current Outpatient Prescriptions  Medication Sig Dispense Refill  . aspirin 81 MG tablet Take 81 mg by mouth daily.        Marland Kitchen BIOTIN PO Take by mouth daily.        . Cholecalciferol (VITAMIN D3) 2000 UNITS TABS Take by mouth.      . clopidogrel (PLAVIX) 75 MG tablet TAKE ONE TABLET BY MOUTH DAILY  30 tablet  5  . Cranberry 500 MG CAPS Take by mouth.        . CRESTOR 10 MG tablet TAKE 1/2 TABLET BY MOUTH DAILY  30 tablet  11  . fenofibrate 54 MG tablet Take 54 mg by mouth 2 (two) times daily. Taking 160mg  daily      . fish oil-omega-3 fatty acids 1000 MG capsule Take 1 g by mouth daily.        . Lactobacillus (ACIDOPHILUS PO) Take by mouth daily.      Marland Kitchen levothyroxine (SYNTHROID, LEVOTHROID) 88 MCG tablet Take 88 mcg by mouth daily.        . metoprolol tartrate (LOPRESSOR) 25 MG tablet Take 1 tablet (25 mg total) by mouth 2 (two) times daily.  60 tablet  11  . Multiple Vitamin (MULTIVITAMIN) tablet Take 1 tablet by mouth daily.        . Nutritional Supplements (ENSURE PO) Take by mouth daily.        . pilocarpine (SALAGEN) 5 MG tablet 5 mg as directed. 1/2 tablet daily       . rasagiline (AZILECT) 0.5 MG TABS Take 1 mg by mouth daily.        Marland Kitchen spironolactone (ALDACTONE) 25 MG tablet Take 25 mg by mouth as directed. Taking 1/2 daily      .  vitamin C (ASCORBIC ACID) 500 MG tablet Take 500 mg by mouth daily.        Marland Kitchen DISCONTD: metoprolol tartrate (LOPRESSOR) 25 MG tablet Take 1 tablet (25 mg total) by mouth 2 (two) times daily.  60 tablet  11  . DISCONTD: metoprolol tartrate (LOPRESSOR) 25 MG tablet Take 25 mg by mouth 2 (two) times daily.       Marland Kitchen DISCONTD: spironolactone (ALDACTONE) 25 MG tablet Take 1 tablet (25 mg total) by mouth as directed.  60 tablet  4    Allergies  Allergen Reactions  . Aciphex (Rabeprazole Sodium)   . Amantadines   . Avapro (Irbesartan)   . Carbidopa-Levodopa   . Cardura (Doxazosin Mesylate)   . Ciprofloxacin   . Clonidine Derivatives   . Cozaar   . Diltiazem   . Famotidine   . Hctz (Hydrochlorothiazide)   . Indapamide   . Lansoprazole   . Lisinopril     Mouth problems  . Loratadine   .  Maxidex (Dexamethasone)   . Mirapex (Pramipexole Dihydrochloride)   . Norvasc (Amlodipine Besylate)   . Pantoprazole Sodium   . Penicillins   . Prilosec (Omeprazole)   . Procardia (Nifedipine)   . Proton Pump Inhibitors   . Sulfa Drugs Cross Reactors   . Trimethoprim   . Zyrtec (Cetirizine Hcl)     Patient Active Problem List  Diagnosis  . Benign hypertensive heart disease without heart failure  . Parkinson's disease  . Hypothyroidism  . Hypercholesterolemia  . Chronic anemia  . Sjogren's syndrome  . TIA (transient ischemic attack)    History  Smoking status  . Former Smoker  . Quit date: 08/26/1970  Smokeless tobacco  . Not on file    History  Alcohol Use No    Family History  Problem Relation Age of Onset  . Heart disease Mother   . Heart failure Father     Review of Systems: Constitutional: no fever chills diaphoresis or fatigue or change in weight.  Head and neck: no hearing loss, no epistaxis, no photophobia or visual disturbance. Respiratory: No cough, shortness of breath or wheezing. Cardiovascular: No chest pain peripheral edema, palpitations. Gastrointestinal: No  abdominal distention, no abdominal pain, no change in bowel habits hematochezia or melena. Genitourinary: No dysuria, no frequency, no urgency, no nocturia. Musculoskeletal:No arthralgias, no back pain, no gait disturbance or myalgias. Neurological: No dizziness, no headaches, no numbness, no seizures, no syncope, no weakness, no tremors. Hematologic: No lymphadenopathy, no easy bruising. Psychiatric: No confusion, no hallucinations, no sleep disturbance.    Physical Exam: Filed Vitals:   08/18/11 0945  BP: 118/78  Pulse: 80   the general appearance reveals a well-developed well-nourished woman in no distress.Pupils equal and reactive.   Extraocular Movements are full.  There is no scleral icterus.  The mouth and pharynx are normal.  The neck is supple.  The carotids reveal no bruits.  The jugular venous pressure is normal.  The thyroid is not enlarged.  There is no lymphadenopathy.  The chest is clear to percussion and auscultation. There are no rales or rhonchi. Expansion of the chest is symmetrical.  The precordium is quiet.  The first heart sound is normal.  The second heart sound is physiologically split.  There is no murmur gallop rub or click.  There is no abnormal lift or heave.  The abdomen is soft and nontender. Bowel sounds are normal. The liver and spleen are not enlarged. There Are no abdominal masses. There are no bruits.  The pedal pulses are good.  There is no phlebitis or edema.  There is no cyanosis or clubbing. Strength is normal and symmetrical in all extremities.  There is no lateralizing weakness.  There are no sensory deficits.  The skin is warm and dry.  There is no rash.  EKG today shows normal sinus rhythm at 81 per minute and is within normal limits.   Assessment / Plan: Continue same medication except increase metoprolol to twice a day and observe response on heart rate.  Recheck in 4 months for followup office visit CBC TSH free T4 lipid panel hepatic  function panel and basal metabolic panel

## 2011-08-18 NOTE — Patient Instructions (Signed)
Increase your Lopressor to 25 mg twice a day  Your physician wants you to follow-up in: 4 months with fasting labs You will receive a reminder letter in the mail two months in advance. If you don't receive a letter, please call our office to schedule the follow-up appointment.

## 2011-08-18 NOTE — Assessment & Plan Note (Signed)
The patient has a history of hypercholesterolemia and is on Crestor and low-dose fenofibrate.  She has not been experiencing any myalgias from the statin therapy.

## 2011-08-18 NOTE — Assessment & Plan Note (Signed)
She is clinically euthyroid on current therapy of Synthroid.  We will plan to recheck her TSH and free T4 at her next visit in 4 months.

## 2011-08-18 NOTE — Assessment & Plan Note (Signed)
The patient checks her pulse and her blood pressure each day and keeps a written record which she brought in today.  Her blood pressure has been satisfactory but she feels that her pulse has been faster than usual.  Her record indicates that her pulse has usually been between 80 and 100 but occasionally has been above 100.  She has noted some tightness in her chest when her pulse is fast and the tightness resolves when her pulse slows down.  We reviewed her medication today and she has been taking her metoprolol tartrate only once a day rather than twice a day.  We will increase it back to twice a day and observe response on her resting pulse and her exertional symptoms.  The patient also has chronic anemia and sees hematology/oncology and her anemia may also be contributing to her exertional tachycardia

## 2011-12-27 ENCOUNTER — Ambulatory Visit (INDEPENDENT_AMBULATORY_CARE_PROVIDER_SITE_OTHER): Payer: Medicare Other | Admitting: Cardiology

## 2011-12-27 ENCOUNTER — Encounter: Payer: Self-pay | Admitting: Cardiology

## 2011-12-27 ENCOUNTER — Other Ambulatory Visit (INDEPENDENT_AMBULATORY_CARE_PROVIDER_SITE_OTHER): Payer: Medicare Other

## 2011-12-27 VITALS — BP 148/68 | HR 83 | Ht 61.5 in | Wt 121.8 lb

## 2011-12-27 DIAGNOSIS — R7309 Other abnormal glucose: Secondary | ICD-10-CM

## 2011-12-27 DIAGNOSIS — R739 Hyperglycemia, unspecified: Secondary | ICD-10-CM | POA: Insufficient documentation

## 2011-12-27 DIAGNOSIS — E039 Hypothyroidism, unspecified: Secondary | ICD-10-CM

## 2011-12-27 DIAGNOSIS — E7800 Pure hypercholesterolemia, unspecified: Secondary | ICD-10-CM

## 2011-12-27 DIAGNOSIS — I119 Hypertensive heart disease without heart failure: Secondary | ICD-10-CM

## 2011-12-27 DIAGNOSIS — R5381 Other malaise: Secondary | ICD-10-CM

## 2011-12-27 LAB — LIPID PANEL
Cholesterol: 128 mg/dL (ref 0–200)
Total CHOL/HDL Ratio: 6
Triglycerides: 211 mg/dL — ABNORMAL HIGH (ref 0.0–149.0)
VLDL: 42.2 mg/dL — ABNORMAL HIGH (ref 0.0–40.0)

## 2011-12-27 LAB — CBC WITH DIFFERENTIAL/PLATELET
Eosinophils Relative: 3.5 % (ref 0.0–5.0)
HCT: 32.9 % — ABNORMAL LOW (ref 36.0–46.0)
Lymphocytes Relative: 38.6 % (ref 12.0–46.0)
Monocytes Relative: 12.7 % — ABNORMAL HIGH (ref 3.0–12.0)
Neutrophils Relative %: 44.9 % (ref 43.0–77.0)
Platelets: 150 10*3/uL (ref 150.0–400.0)
WBC: 5.7 10*3/uL (ref 4.5–10.5)

## 2011-12-27 LAB — TSH: TSH: 5.1 u[IU]/mL (ref 0.35–5.50)

## 2011-12-27 LAB — LDL CHOLESTEROL, DIRECT: Direct LDL: 65.1 mg/dL

## 2011-12-27 LAB — HEMOGLOBIN A1C: Hgb A1c MFr Bld: 6.1 % (ref 4.6–6.5)

## 2011-12-27 LAB — BASIC METABOLIC PANEL
CO2: 25 mEq/L (ref 19–32)
Calcium: 9.2 mg/dL (ref 8.4–10.5)
GFR: 36.58 mL/min — ABNORMAL LOW (ref >60.00)
Sodium: 136 mEq/L (ref 135–145)

## 2011-12-27 LAB — HEPATIC FUNCTION PANEL
AST: 40 U/L — ABNORMAL HIGH (ref 0–37)
Albumin: 3.9 g/dL (ref 3.5–5.2)

## 2011-12-27 NOTE — Assessment & Plan Note (Signed)
The patient's blood pressure has been running slightly high although I rechecked today it is down to 136/66 when she is relaxed.  She has not been having any TIA symptoms.  No chest pain or increased exertional dyspnea.

## 2011-12-27 NOTE — Patient Instructions (Signed)
Will obtain labs today and call you with the results (lp/bmet/hfp/a1c/tsh/cbc)  Your physician recommends that you continue on your current medications as directed. Please refer to the Current Medication list given to you today.  Your physician recommends that you schedule a follow-up appointment in: 4 months with fasting labs (lp/bmet/hfp)

## 2011-12-27 NOTE — Assessment & Plan Note (Signed)
The patient has had some early numbness and tingling in her feet.  She is not known to be diabetic but her last blood sugars have been slightly high and we are checking a glycohemoglobin today

## 2011-12-27 NOTE — Progress Notes (Signed)
Lu Duffel Date of Birth:  [birth year] [facility] [address] [place], [place]  [address] [phone]         Fax   [phone]  History of Present Illness: This pleasant 78 year old woman is seen for a scheduled four-month followup office visit. He has a history of essential hypertension. She has a past history of a TIA and is on aspirin and Plavix. She has a history of Parkinson's disease and a history of Sjogren's syndrome. She does not have any known ischemic heart disease and she had a normal nuclear stress test in 2009. She has stage III chronic kidney disease. She also had problems with her urinary bladder.  Since last visit her neurologist Dr. Craige Cotta in [facility] has switched her to a new patch for Parkinson's disease.   Current Outpatient Prescriptions  Medication Sig Dispense Refill  . aspirin 81 MG tablet Take 81 mg by mouth daily.        Marland Kitchen BIOTIN PO Take by mouth daily.        . Cholecalciferol (VITAMIN D3) 2000 UNITS TABS Take by mouth.      . clopidogrel (PLAVIX) 75 MG tablet TAKE ONE TABLET BY MOUTH DAILY  30 tablet  5  . Cranberry 500 MG CAPS Take by mouth.        . CRESTOR 10 MG tablet TAKE 1/2 TABLET BY MOUTH DAILY  30 tablet  11  . fenofibrate 54 MG tablet Take 54 mg by mouth 2 (two) times daily. Taking 160mg  daily      . fish oil-omega-3 fatty acids 1000 MG capsule Take 1 g by mouth daily.        . Lactobacillus (ACIDOPHILUS PO) Take by mouth daily.      Marland Kitchen levothyroxine (SYNTHROID, LEVOTHROID) 88 MCG tablet Take 88 mcg by mouth daily.        . metoprolol tartrate (LOPRESSOR) 25 MG tablet Take 1 tablet (25 mg total) by mouth 2 (two) times daily.  60 tablet  11  . Multiple Vitamin (MULTIVITAMIN) tablet Take 1 tablet by mouth daily.        . Nutritional Supplements (ENSURE PO) Take by mouth daily.        . pilocarpine (SALAGEN) 5 MG tablet 5 mg as directed. 1/2 tablet daily       . rasagiline (AZILECT) 0.5 MG TABS Take 1 mg by mouth daily.         Marland Kitchen spironolactone (ALDACTONE) 25 MG tablet Take 25 mg by mouth as directed. Taking 1/2 daily      . vitamin C (ASCORBIC ACID) 500 MG tablet Take 500 mg by mouth daily.          Allergies  Allergen Reactions  . Aciphex (Rabeprazole Sodium)   . Amantadines   . Avapro (Irbesartan)   . Carbidopa-Levodopa   . Cardura (Doxazosin Mesylate)   . Ciprofloxacin   . Clonidine Derivatives   . Cozaar   . Diltiazem   . Famotidine   . Hctz (Hydrochlorothiazide)   . Indapamide   . Lansoprazole   . Lisinopril     Mouth problems  . Loratadine   . Maxidex (Dexamethasone)   . Mirapex (Pramipexole Dihydrochloride)   . Norvasc (Amlodipine Besylate)   . Pantoprazole Sodium   . Penicillins   . Prilosec (Omeprazole)   . Procardia (Nifedipine)   . Proton Pump Inhibitors   . Sulfa Drugs Cross Reactors   . Trimethoprim   . Zyrtec (Cetirizine Hcl)  Patient Active Problem List  Diagnosis  . Benign hypertensive heart disease without heart failure  . Parkinson's disease  . Hypothyroidism  . Hypercholesterolemia  . Chronic anemia  . Sjogren's syndrome  . TIA (transient ischemic attack)  . Hyperglycemia    History  Smoking status  . Former Smoker  . Quit date: 08/26/1970  Smokeless tobacco  . Not on file    History  Alcohol Use No    Family History  Problem Relation Age of Onset  . Heart disease Mother   . Heart failure Father     Review of Systems: Constitutional: no fever chills diaphoresis or fatigue or change in weight.  Head and neck: no hearing loss, no epistaxis, no photophobia or visual disturbance. Respiratory: No cough, shortness of breath or wheezing. Cardiovascular: No chest pain peripheral edema, palpitations. Gastrointestinal: No abdominal distention, no abdominal pain, no change in bowel habits hematochezia or melena. Genitourinary: No dysuria, no frequency, no urgency, no nocturia. Musculoskeletal:No arthralgias, no back pain, no gait disturbance or  myalgias. Neurological: No dizziness, no headaches, no numbness, no seizures, no syncope, no weakness, no tremors. Hematologic: No lymphadenopathy, no easy bruising. Psychiatric: No confusion, no hallucinations, no sleep disturbance.    Physical Exam: Filed Vitals:   12/27/11 0926  BP: 148/68  Pulse: 83   the general appearance reveals a well-developed well-nourished elderly woman in no distress.The head and neck exam reveals pupils equal and reactive.  Extraocular movements are full.  There is no scleral icterus.  The mouth and pharynx are normal.  The neck is supple.  The carotids reveal no bruits.  The jugular venous pressure is normal.  The  thyroid is not enlarged.  There is no lymphadenopathy.  The chest is clear to percussion and auscultation.  There are no rales or rhonchi.  Expansion of the chest is symmetrical.  The precordium is quiet.  The first heart sound is normal.  The second heart sound is physiologically split.  There is no murmur gallop rub or click.  There is no abnormal lift or heave.  The abdomen is soft and nontender.  The bowel sounds are normal.  The liver and spleen are not enlarged.  There are no abdominal masses.  There are no abdominal bruits.  Extremities reveal good pedal pulses.  There is no phlebitis or edema.  There is no cyanosis or clubbing.  Strength is normal and symmetrical in all extremities.  There is no lateralizing weakness.  There are no sensory deficits.  The skin is warm and dry.  There is no rash.     Assessment / Plan:  Continue same medication.  Recheck in 4 months for followup office visit lipid panel hepatic function panel and basal metabolic panel

## 2011-12-27 NOTE — Assessment & Plan Note (Signed)
The patient has hypercholesterolemia but is intolerant of statins.  She is on fenofibrate 54 mg daily.  Blood work today is pending

## 2011-12-30 LAB — T4, FREE: Free T4: 1.4 ng/dL (ref 0.60–1.60)

## 2012-01-02 NOTE — Progress Notes (Signed)
Quick Note:  Please report to patient. The recent labs are stable. Continue same medication and careful diet. A1C is normal. BS is normal. Thyroid is normal ______

## 2012-01-05 ENCOUNTER — Telehealth: Payer: Self-pay | Admitting: *Deleted

## 2012-01-05 NOTE — Telephone Encounter (Signed)
Message copied by Burnell Blanks on Wed Jan 05, 2012  9:12 AM ------      Message from: Cassell Clement      Created: Sun Jan 02, 2012  9:00 PM       Please report to patient.  The recent labs are stable. Continue same medication and careful diet.  A1C is normal. BS is normal. Thyroid is normal

## 2012-01-05 NOTE — Telephone Encounter (Signed)
Advised patient of lab results  

## 2012-01-20 ENCOUNTER — Other Ambulatory Visit: Payer: Self-pay

## 2012-01-20 MED ORDER — CLOPIDOGREL BISULFATE 75 MG PO TABS: 75.0000 mg | ORAL_TABLET | Freq: Every day | ORAL | Status: DC

## 2012-04-10 ENCOUNTER — Telehealth: Payer: Self-pay

## 2012-04-10 MED ORDER — SPIRONOLACTONE 25 MG PO TABS: 25.0000 mg | ORAL_TABLET | ORAL | Status: DC

## 2012-04-11 NOTE — Telephone Encounter (Signed)
New Problem:    Called in needing clarification on the instructions for the patient's spironolactone (ALDACTONE) 25 MG tablet.  Please call back.

## 2012-04-12 ENCOUNTER — Other Ambulatory Visit: Payer: Self-pay | Admitting: *Deleted

## 2012-04-12 MED ORDER — SPIRONOLACTONE 25 MG PO TABS: 12.5000 mg | ORAL_TABLET | Freq: Every day | ORAL | Status: DC

## 2012-04-12 NOTE — Addendum Note (Signed)
Addended by: Kem Parkinson on: 04/12/2012 10:27 AM   Modules accepted: Orders

## 2012-04-12 NOTE — Telephone Encounter (Signed)
Spoke with pharmacy...told them 1/2 tab po qd

## 2012-04-12 NOTE — Telephone Encounter (Signed)
Also changed how its written on med list

## 2012-04-13 ENCOUNTER — Ambulatory Visit (INDEPENDENT_AMBULATORY_CARE_PROVIDER_SITE_OTHER): Payer: Medicare Other

## 2012-04-13 DIAGNOSIS — I119 Hypertensive heart disease without heart failure: Secondary | ICD-10-CM

## 2012-04-13 NOTE — Progress Notes (Signed)
Placed a 48 hrst monitor on patient and went over instructions on how to use it and when to return it.

## 2012-04-13 NOTE — Addendum Note (Signed)
Addended by: Sharin Grave on: 04/13/2012 04:24 PM   Modules accepted: Orders

## 2012-05-03 ENCOUNTER — Telehealth: Payer: Self-pay | Admitting: Cardiology

## 2012-05-03 NOTE — Telephone Encounter (Signed)
Dr Craige Cotta ordered. Will discuss with Tara/Rose

## 2012-05-03 NOTE — Telephone Encounter (Signed)
Follow Up    Following up of results of monitor testing.

## 2012-05-15 ENCOUNTER — Telehealth: Payer: Self-pay | Admitting: Cardiology

## 2012-05-15 NOTE — Telephone Encounter (Signed)
Left message for daughter to call back.  

## 2012-05-15 NOTE — Telephone Encounter (Signed)
New Problem:    Patient called in returning your call. Please call back. 

## 2012-05-15 NOTE — Telephone Encounter (Signed)
Follow up call   Monitor was sent on 2/10 . Now 1 months waiting for test results.     Was told by Okey Dupre only have 2 patients in the afternoon on Thursday. Was told she will stay late to get her mother's test results.   Aware that the office was close on Friday. Was advise from [facility] that she was behind on paperwork. The daughter ask the question how long it take for the test results to be given.   Daughter stated she left a message with Print production planner. On Thursday morning.

## 2012-05-15 NOTE — Telephone Encounter (Signed)
Advised daughter of report and will fax to Dr Kirby in [place]  Impression by  Dr. Brackbill   Unremarkable holter monitor   Rare PAC's and PVC's  No brady arrhythmia or pauses  NSR 

## 2012-05-15 NOTE — Telephone Encounter (Signed)
Advised daughter of report and will fax to Dr Craige Cotta in [facility]  Impression by  Dr. Precious Reel holter monitor   Rare PAC's and PVC's  No brady arrhythmia or pauses  NSR

## 2012-05-24 ENCOUNTER — Ambulatory Visit (INDEPENDENT_AMBULATORY_CARE_PROVIDER_SITE_OTHER): Payer: Medicare Other | Admitting: Cardiology

## 2012-05-24 ENCOUNTER — Encounter: Payer: Self-pay | Admitting: Cardiology

## 2012-05-24 VITALS — BP 124/72 | HR 62 | Ht 61.0 in | Wt 122.4 lb

## 2012-05-24 DIAGNOSIS — R0609 Other forms of dyspnea: Secondary | ICD-10-CM

## 2012-05-24 NOTE — Assessment & Plan Note (Signed)
The patient's daughter discussed with me the fact that the patient may seek another opinion about her Parkinson's from one of the neurologists at [facility], Drs. Boris Sharper.  The patient complains of lack of energy.  This could be from the Parkinson's disease

## 2012-05-24 NOTE — Patient Instructions (Addendum)
Your physician has requested that you have an echocardiogram. Echocardiography is a painless test that uses sound waves to create images of your heart. It provides your doctor with information about the size and shape of your heart and how well your heart's chambers and valves are working. This procedure takes approximately one hour. There are no restrictions for this procedure.   Your physician recommends that you continue on your current medications as directed. Please refer to the Current Medication list given to you today.  Your physician wants you to follow-up in: 4 month  You will receive a reminder letter in the mail two months in advance. If you don't receive a letter, please call our office to schedule the follow-up appointment.   WILL HAVE YOU GO FOR A CHEST XRAY TO THE [facility] FROM  SOON

## 2012-05-24 NOTE — Progress Notes (Signed)
Victoria Lindsey Date of Birth:  [birth year] [facility] [address] [place], [place]  [address] [phone]         Fax   [phone]  History of Present Illness: This pleasant 79 year old woman is seen for a scheduled four-month followup office visit. He has a history of essential hypertension. She has a past history of a TIAs and is on aspirin and Plavix. She has a history of Parkinson's disease and a history of Sjogren's syndrome. She does not have any known ischemic heart disease and she had a normal nuclear stress test in 2009. She has stage III chronic kidney disease. She also had problems with her urinary bladder. Since last visit her neurologist Dr. Craige Cotta in [facility] has switched her to a new patch for Parkinson's disease.   Current Outpatient Prescriptions  Medication Sig Dispense Refill  . aspirin 81 MG tablet Take 81 mg by mouth daily.        Marland Kitchen BIOTIN PO Take by mouth daily.        . Calcium Carbonate-Vitamin D (CALCIUM 600 + D PO) Take by mouth daily.      . Cholecalciferol (VITAMIN D3) 2000 UNITS TABS Take by mouth.      . clopidogrel (PLAVIX) 75 MG tablet Take 1 tablet (75 mg total) by mouth daily.  30 tablet  5  . fenofibrate 160 MG tablet Take 160 mg by mouth daily.      . fish oil-omega-3 fatty acids 1000 MG capsule Take 1 g by mouth daily.        Marland Kitchen levothyroxine (SYNTHROID, LEVOTHROID) 100 MCG tablet Take 100 mcg by mouth daily.      . metoprolol tartrate (LOPRESSOR) 25 MG tablet Take 1 tablet (25 mg total) by mouth 2 (two) times daily.  60 tablet  11  . Multiple Vitamin (MULTIVITAMIN) tablet Take 1 tablet by mouth daily.        . Nutritional Supplements (ENSURE PO) Take by mouth daily.        . rasagiline (AZILECT) 1 MG TABS Take 1 mg by mouth daily.      . rosuvastatin (CRESTOR) 10 MG tablet       . rotigotine (NEUPRO) 6 MG/24HR Place 1 patch onto the skin daily.      Marland Kitchen spironolactone (ALDACTONE) 25 MG tablet Take 0.5 tablets (12.5 mg total)  by mouth daily.  15 tablet  5  . vitamin C (ASCORBIC ACID) 500 MG tablet Take 500 mg by mouth daily.         No current facility-administered medications for this visit.    Allergies  Allergen Reactions  . Aciphex (Rabeprazole Sodium)   . Amantadines   . Avapro (Irbesartan)   . Carbidopa-Levodopa   . Cardura (Doxazosin Mesylate)   . Ciprofloxacin   . Clonidine Derivatives   . Cozaar   . Diltiazem   . Famotidine   . Hctz (Hydrochlorothiazide)   . Indapamide   . Lansoprazole   . Lisinopril     Mouth problems  . Loratadine   . Maxidex (Dexamethasone)   . Mirapex (Pramipexole Dihydrochloride)   . Norvasc (Amlodipine Besylate)   . Pantoprazole Sodium   . Penicillins   . Prilosec (Omeprazole)   . Procardia (Nifedipine)   . Proton Pump Inhibitors   . Sulfa Drugs Cross Reactors   . Trimethoprim   . Zyrtec (Cetirizine Hcl)     Patient Active Problem List  Diagnosis  . Benign hypertensive heart disease  without heart failure  . Parkinson's disease  . Hypothyroidism  . Hypercholesterolemia  . Chronic anemia  . Sjogren's syndrome  . TIA (transient ischemic attack)  . Hyperglycemia    History  Smoking status  . Former Smoker  . Quit date: 08/26/1970  Smokeless tobacco  . Not on file    History  Alcohol Use No    Family History  Problem Relation Age of Onset  . Heart disease Mother   . Heart failure Father     Review of Systems: Constitutional: no fever chills diaphoresis or fatigue or change in weight.  Head and neck: no hearing loss, no epistaxis, no photophobia or visual disturbance. Respiratory: No cough, shortness of breath or wheezing. Cardiovascular: No chest pain peripheral edema, palpitations. Gastrointestinal: No abdominal distention, no abdominal pain, no change in bowel habits hematochezia or melena. Genitourinary: No dysuria, no frequency, no urgency, no nocturia. Musculoskeletal:No arthralgias, no back pain, no gait disturbance or  myalgias. Neurological: No dizziness, no headaches, no numbness, no seizures, no syncope, no weakness, no tremors. Hematologic: No lymphadenopathy, no easy bruising. Psychiatric: No confusion, no hallucinations, no sleep disturbance.    Physical Exam: Filed Vitals:   05/24/12 1700  BP: 124/72  Pulse: 62   the general appearance reveals an elderly alert woman in no distress.The head and neck exam reveals pupils equal and reactive.  Extraocular movements are full.  There is no scleral icterus.  The mouth and pharynx are normal.  The neck is supple.  The carotids reveal no bruits.  The jugular venous pressure is normal.  The  thyroid is not enlarged.  There is no lymphadenopathy.  The chest is clear to percussion and auscultation.  There are no rales or rhonchi.  Expansion of the chest is symmetrical.  The precordium is quiet.  The first heart sound is normal.  The second heart sound is physiologically split.  There is no murmur gallop rub or click.  There is no abnormal lift or heave.  The abdomen is soft and nontender.  The bowel sounds are normal.  The liver and spleen are not enlarged.  There are no abdominal masses.  There are no abdominal bruits.  Extremities reveal good pedal pulses.  There is no phlebitis and there is trace edema.  There is no cyanosis or clubbing.  Strength is normal and symmetrical in all extremities.  There is no lateralizing weakness.  There are no sensory deficits.  The skin is warm and dry.  There is no rash.     Assessment / Plan: Continue same medication and recheck in 4 months for followup office visit.  Get chest x-ray and 2-D echo.  She will be following up at [facility] regarding her Parkinson's.

## 2012-05-24 NOTE — Assessment & Plan Note (Addendum)
The patient had a recent episode of diplopia.  Her neurologist ordered carotid Dopplers which were stated to be normal and her brain MRI showed only changes of aging.  We did a 48 hour Holter monitor here which was unremarkable.  The patient has not had a recent echocardiogram and we will update her echo.  She has not had a chest x-ray since 2010 and we will update her chest x-ray.  The patient remains on aspirin and Plavix because of previous TIAs

## 2012-05-24 NOTE — Assessment & Plan Note (Signed)
Blood pressure has been stable on current medication.

## 2012-05-25 ENCOUNTER — Ambulatory Visit (INDEPENDENT_AMBULATORY_CARE_PROVIDER_SITE_OTHER)
Admission: RE | Admit: 2012-05-25 | Discharge: 2012-05-25 | Disposition: A | Discharge status: Home / Self Care | Payer: Medicare Other | Source: Ambulatory Visit | Attending: Cardiology | Admitting: Cardiology

## 2012-05-25 DIAGNOSIS — R0600 Dyspnea, unspecified: Secondary | ICD-10-CM

## 2012-05-31 ENCOUNTER — Ambulatory Visit: Payer: Medicare Other | Admitting: Cardiology

## 2012-05-31 ENCOUNTER — Telehealth: Payer: Self-pay | Admitting: *Deleted

## 2012-05-31 NOTE — Telephone Encounter (Signed)
Advised patient

## 2012-05-31 NOTE — Telephone Encounter (Signed)
Message copied by Burnell Blanks on Wed May 31, 2012  4:57 PM ------      Message from: Cassell Clement      Created: Mon May 29, 2012  8:20 PM       Please report.  The xray does not show any CHF.There may be some atelectasis at lingula area of left lung so she needs to work on taking good deep breaths.  CSD. ------

## 2012-06-05 ENCOUNTER — Telehealth: Payer: Self-pay | Admitting: Cardiology

## 2012-06-05 ENCOUNTER — Ambulatory Visit (HOSPITAL_COMMUNITY): Payer: Medicare Other | Attending: Cardiology | Admitting: Radiology

## 2012-06-05 DIAGNOSIS — R0600 Dyspnea, unspecified: Secondary | ICD-10-CM

## 2012-06-05 DIAGNOSIS — E7800 Pure hypercholesterolemia, unspecified: Secondary | ICD-10-CM | POA: Insufficient documentation

## 2012-06-05 DIAGNOSIS — Z8673 Personal history of transient ischemic attack (TIA), and cerebral infarction without residual deficits: Secondary | ICD-10-CM | POA: Insufficient documentation

## 2012-06-05 DIAGNOSIS — R0602 Shortness of breath: Secondary | ICD-10-CM

## 2012-06-05 DIAGNOSIS — G20A1 Parkinson's disease without dyskinesia, without mention of fluctuations: Secondary | ICD-10-CM | POA: Insufficient documentation

## 2012-06-05 DIAGNOSIS — R0989 Other specified symptoms and signs involving the circulatory and respiratory systems: Secondary | ICD-10-CM | POA: Insufficient documentation

## 2012-06-05 DIAGNOSIS — E039 Hypothyroidism, unspecified: Secondary | ICD-10-CM | POA: Insufficient documentation

## 2012-06-05 DIAGNOSIS — Z87891 Personal history of nicotine dependence: Secondary | ICD-10-CM | POA: Insufficient documentation

## 2012-06-05 DIAGNOSIS — R0609 Other forms of dyspnea: Secondary | ICD-10-CM | POA: Insufficient documentation

## 2012-06-05 DIAGNOSIS — G20 Parkinson's disease: Secondary | ICD-10-CM | POA: Insufficient documentation

## 2012-06-05 DIAGNOSIS — D649 Anemia, unspecified: Secondary | ICD-10-CM | POA: Insufficient documentation

## 2012-06-05 NOTE — Telephone Encounter (Signed)
Discussed with daughter 

## 2012-06-05 NOTE — Progress Notes (Signed)
Echocardiogram performed.  

## 2012-06-05 NOTE — Telephone Encounter (Signed)
New problem    Victoria Lindsey/pts daughter wants a call to explain her mother's chest x-ray

## 2012-06-07 ENCOUNTER — Telehealth: Payer: Self-pay | Admitting: *Deleted

## 2012-06-07 NOTE — Telephone Encounter (Signed)
Message copied by Burnell Blanks on Wed Jun 07, 2012  3:13 PM ------      Message from: Cassell Clement      Created: Mon Jun 05, 2012  7:16 PM       Echo shows good LV function. Valve function is good. CSD ------

## 2012-06-07 NOTE — Telephone Encounter (Signed)
She had a normal stress test in 2009 so I do not think she needs another one at her age. Her dyspnea may be related to her Parkinsons or to some COPD. I would like her to see Dr. Fannie Knee or associates re her DOE.

## 2012-06-07 NOTE — Telephone Encounter (Signed)
Advised daughter. Daughter wants to know what is next in terms of evaluating her breathing. Will forward to  Dr. Patty Sermons for recommendations.

## 2012-06-08 NOTE — Telephone Encounter (Signed)
Dr Shelle Iron April 10 at 9:45, advised daughter

## 2012-06-15 ENCOUNTER — Ambulatory Visit (INDEPENDENT_AMBULATORY_CARE_PROVIDER_SITE_OTHER): Payer: Medicare Other | Admitting: Pulmonary Disease

## 2012-06-15 ENCOUNTER — Encounter: Payer: Self-pay | Admitting: Pulmonary Disease

## 2012-06-15 VITALS — BP 150/68 | HR 82 | Temp 98.1°F | Ht 61.0 in | Wt 125.4 lb

## 2012-06-15 DIAGNOSIS — I5033 Acute on chronic diastolic (congestive) heart failure: Secondary | ICD-10-CM | POA: Insufficient documentation

## 2012-06-15 DIAGNOSIS — R0600 Dyspnea, unspecified: Secondary | ICD-10-CM

## 2012-06-15 DIAGNOSIS — R0609 Other forms of dyspnea: Secondary | ICD-10-CM

## 2012-06-15 LAB — PULMONARY FUNCTION TEST

## 2012-06-15 NOTE — Progress Notes (Signed)
PFT done today. 

## 2012-06-15 NOTE — Patient Instructions (Addendum)
Will schedule for breathing studies as soon as we can, and will call to discuss results once available.

## 2012-06-15 NOTE — Assessment & Plan Note (Signed)
The patient has significant dyspnea that she feels is progressive, but has been present since an early age.  It is unclear whether this could represent undiagnosed asthma, or whether her chronically elevated left hemidiaphragm has been present for quite some time and is paralyzed.  Currently she has many possibilities for her worsening dyspnea.  She is quite frail with obvious deconditioning, has chronic anemia with borderline thyroid testing, has Parkinson's disease and is describing increased symptoms that suggest bulbar dysfunction.  She tells me she runs out of breath while talking just a few sentences, and has noticed a change in her voice.  She also is starting to have more episodes of choking with solids and liquid.  All of this is concerning for bulbar dysfunction.  Could she be having silent aspiration which is resulting in progressive dyspnea?  At this point, I would want to start with full pulmonary function studies and proceed from there.

## 2012-06-15 NOTE — Progress Notes (Signed)
  Subjective:    Patient ID: Victoria Lindsey, female    DOB: 1926/03/07, 79 y.o.   MRN: [id number]  HPI The patient is an 79 year old female who I have been asked to see for dyspnea.  The patient states she essentially was her breath issue since her 30s, and has gotten worse over time.  Most recently it has gotten to the point where she will get winded just walking through her house, and she feels that she does not have enough breath to talk even in short sentences.  She denies any significant cough or mucus production, and has no pleuritic chest pain.  She has no history of thromboembolic disease.  She has had a stress test in the past without ischemia, and a recent echo only shows mild diastolic dysfunction.  She does have a history of Parkinson's disease, and gives a history that is suggestive of bulbar dysfunction.  She has been getting choked lately with solids much worse than liquids.  She has had a recent chest x-ray that shows a chronic elevation of her left hemidiaphragm dating back to at least 2010, and also what appears to be atelectasis in the lingula.  She denies any history of chest trauma or surgery.  She does have a history of chronic anemia, and her thyroid test at the end of last year showed a borderline TSH.   Review of Systems  Constitutional: Negative for fever and unexpected weight change.  HENT: Positive for rhinorrhea and postnasal drip. Negative for ear pain, nosebleeds, congestion, sore throat, sneezing, trouble swallowing, dental problem and sinus pressure.        Dry mouth, dry eyes  Eyes: Negative for redness and itching.  Respiratory: Positive for cough, shortness of breath and wheezing. Negative for chest tightness.   Cardiovascular: Positive for leg swelling ( ankle). Negative for palpitations.  Gastrointestinal: Negative for nausea and vomiting.  Genitourinary: Negative for dysuria.  Musculoskeletal: Negative for joint swelling.  Skin: Negative for rash.   Neurological: Positive for headaches.  Hematological: Does not bruise/bleed easily.  Psychiatric/Behavioral: Negative for dysphoric mood. The patient is not nervous/anxious.        Objective:   Physical Exam Constitutional:  Frail appearing female, no acute distress  HENT:  Nares patent without discharge  Oropharynx without exudate, palate and uvula are normal  Eyes:  Perrla, eomi, no scleral icterus  Neck:  No JVD, no TMG  Cardiovascular:  Normal rate, regular rhythm, no rubs or gallops.  No murmurs        Intact distal pulses  Pulmonary :  Decreased depth inspiration, no stridor or respiratory distress   No rhonchi, or wheezing.  Faint bibasilar crackles.   Abdominal:  Soft, nondistended, bowel sounds present.  No tenderness noted.   Musculoskeletal:  1+ lower extremity edema noted.  Lymph Nodes:  No cervical lymphadenopathy noted  Skin:  No cyanosis noted  Neurologic:  Alert, appropriate, moves all 4 extremities without obvious deficit.         Assessment & Plan:

## 2012-06-19 ENCOUNTER — Encounter: Payer: Self-pay | Admitting: Pulmonary Disease

## 2012-06-19 ENCOUNTER — Telehealth: Payer: Self-pay | Admitting: Pulmonary Disease

## 2012-06-19 NOTE — Telephone Encounter (Signed)
Please let pt know that her pfts show no significant obstruction (like copd or asthma), no restriction of her lungs. Would like to evaluate her left diaphragm to see if it functions normally or not. If ok with her , would order xray of her diaphragm where she sniffs and breaths, and they let us know if it is moving properly .  Let me know and I will order.

## 2012-06-20 ENCOUNTER — Other Ambulatory Visit: Payer: Self-pay | Admitting: Pulmonary Disease

## 2012-06-20 DIAGNOSIS — R0600 Dyspnea, unspecified: Secondary | ICD-10-CM

## 2012-06-20 NOTE — Telephone Encounter (Signed)
Spoke with Daughter (Donna)--aware of results and recs per [facility]. Daughter requests that she be called with the sppt since she is the main transportation for the patient. Patient also aware of results and recs per [facility]--pt is okay with going forward with the xray of the diaphragm.

## 2012-06-20 NOTE — Telephone Encounter (Signed)
Will schedule sniff test.

## 2012-06-26 ENCOUNTER — Ambulatory Visit (HOSPITAL_COMMUNITY)
Admission: RE | Admit: 2012-06-26 | Discharge: 2012-06-26 | Disposition: A | Discharge status: Home / Self Care | Payer: Medicare Other | Source: Ambulatory Visit | Attending: Pulmonary Disease | Admitting: Pulmonary Disease

## 2012-06-26 DIAGNOSIS — R059 Cough, unspecified: Secondary | ICD-10-CM | POA: Insufficient documentation

## 2012-06-26 DIAGNOSIS — J3489 Other specified disorders of nose and nasal sinuses: Secondary | ICD-10-CM | POA: Insufficient documentation

## 2012-06-26 DIAGNOSIS — R0989 Other specified symptoms and signs involving the circulatory and respiratory systems: Secondary | ICD-10-CM | POA: Insufficient documentation

## 2012-06-26 DIAGNOSIS — R0600 Dyspnea, unspecified: Secondary | ICD-10-CM

## 2012-06-26 DIAGNOSIS — R0602 Shortness of breath: Secondary | ICD-10-CM | POA: Insufficient documentation

## 2012-06-26 DIAGNOSIS — R05 Cough: Secondary | ICD-10-CM | POA: Insufficient documentation

## 2012-07-17 ENCOUNTER — Encounter: Payer: Self-pay | Admitting: Pulmonary Disease

## 2012-07-19 ENCOUNTER — Other Ambulatory Visit: Payer: Self-pay | Admitting: *Deleted

## 2012-07-19 MED ORDER — CLOPIDOGREL BISULFATE 75 MG PO TABS: 75.0000 mg | ORAL_TABLET | Freq: Every day | ORAL | Status: DC

## 2012-08-02 ENCOUNTER — Other Ambulatory Visit: Payer: Self-pay | Admitting: Emergency Medicine

## 2012-08-02 MED ORDER — METOPROLOL TARTRATE 25 MG PO TABS: 25.0000 mg | ORAL_TABLET | Freq: Two times a day (BID) | ORAL | Status: DC

## 2012-09-27 ENCOUNTER — Telehealth: Payer: Self-pay | Admitting: Cardiology

## 2012-09-27 NOTE — Telephone Encounter (Signed)
Left message to call back  

## 2012-09-27 NOTE — Telephone Encounter (Signed)
New Prob  Victoria Lindsey would like to speak with you regarding this pt diagnosis to see if she qualifies for their heart failure program as well as their diabetes program. She also wants to know the ejection fraction from her EKG that was done.

## 2012-11-03 ENCOUNTER — Telehealth: Payer: Self-pay | Admitting: Cardiology

## 2012-11-03 NOTE — Telephone Encounter (Signed)
New Problem  Pt called and made a follow up appt// no recall// does she needs a fasting lab for this appt.

## 2012-11-03 NOTE — Telephone Encounter (Signed)
Called patient back. She has not had a lipid panel at another office. Last labs here almost 1 year ago. Advised her to fast when she comes in for her September appointment with Dr.Brackbill so that her blood can be drawn after she sees the doctor. Patient agreed with this plan.

## 2012-11-24 ENCOUNTER — Ambulatory Visit (INDEPENDENT_AMBULATORY_CARE_PROVIDER_SITE_OTHER): Payer: Medicare Other | Admitting: Cardiology

## 2012-11-24 ENCOUNTER — Encounter: Payer: Self-pay | Admitting: Cardiology

## 2012-11-24 VITALS — BP 142/68 | HR 86 | Ht 61.0 in | Wt 119.2 lb

## 2012-11-24 DIAGNOSIS — E7800 Pure hypercholesterolemia, unspecified: Secondary | ICD-10-CM

## 2012-11-24 DIAGNOSIS — E785 Hyperlipidemia, unspecified: Secondary | ICD-10-CM

## 2012-11-24 DIAGNOSIS — G459 Transient cerebral ischemic attack, unspecified: Secondary | ICD-10-CM

## 2012-11-24 DIAGNOSIS — D649 Anemia, unspecified: Secondary | ICD-10-CM

## 2012-11-24 DIAGNOSIS — I119 Hypertensive heart disease without heart failure: Secondary | ICD-10-CM

## 2012-11-24 NOTE — Assessment & Plan Note (Signed)
The patient has not had any TIA symptoms. 

## 2012-11-24 NOTE — Assessment & Plan Note (Signed)
The patient has a history of hypercholesterolemia and is on Crestor and fenofibrate.  She is not having any myalgias.  Blood work is followed by her primary care physician

## 2012-11-24 NOTE — Progress Notes (Signed)
Victoria Lindsey Date of Birth:  1925-05-13 [facility] [address] [place], [place]  [address] [phone]         Fax   [phone]  History of Present Illness: This pleasant 79 year old woman is seen for a scheduled four-month followup office visit.  She has a history of essential hypertension. She has a past history of a TIAs and is on aspirin and Plavix. She has a history of Parkinson's disease and a history of Sjogren's syndrome. She does not have any known ischemic heart disease and she had a normal nuclear stress test in 2009. She has stage III chronic kidney disease. She also had problems with her urinary bladder. Since last visit her neurologist Dr. Craige Cotta in [facility] has switched her to a new patch for Parkinson's disease.  Since last visit she has had no new cardiovascular symptoms.   Current Outpatient Prescriptions  Medication Sig Dispense Refill  . aspirin 81 MG tablet Take 81 mg by mouth daily.        Marland Kitchen BIOTIN PO Take by mouth daily.        . Blood Glucose Monitoring Suppl (ONE TOUCH ULTRA SYSTEM KIT) W/DEVICE KIT 1 kit by Does not apply route once.      . Calcium Carbonate-Vitamin D (CALCIUM 600 + D PO) Take by mouth daily.      . Cholecalciferol (VITAMIN D3) 2000 UNITS TABS Take by mouth.      . clopidogrel (PLAVIX) 75 MG tablet Take 1 tablet (75 mg total) by mouth daily.  30 tablet  5  . ENSURE PLUS (ENSURE PLUS) LIQD Take 237 mLs by mouth.      . fenofibrate 160 MG tablet Take 160 mg by mouth daily.      . fish oil-omega-3 fatty acids 1000 MG capsule Take 1 g by mouth daily.        Marland Kitchen glucose blood test strip 1 each by Other route as needed for other. Use as instructed      . levothyroxine (SYNTHROID, LEVOTHROID) 100 MCG tablet Take 100 mcg by mouth daily.      . metoprolol tartrate (LOPRESSOR) 25 MG tablet Take 1 tablet (25 mg total) by mouth 2 (two) times daily.  60 tablet  3  . rasagiline (AZILECT) 1 MG TABS Take 1 mg by mouth daily.      .  rosuvastatin (CRESTOR) 10 MG tablet       . rotigotine (NEUPRO) 6 MG/24HR Place 1 patch onto the skin daily.      Marland Kitchen spironolactone (ALDACTONE) 25 MG tablet Take 0.5 tablets (12.5 mg total) by mouth daily.  15 tablet  5   No current facility-administered medications for this visit.    Allergies  Allergen Reactions  . Aciphex [Rabeprazole Sodium]   . Amantadines   . Avapro [Irbesartan]   . Carbidopa-Levodopa   . Cardura [Doxazosin Mesylate]   . Ciprofloxacin   . Clonidine Derivatives   . Cozaar   . Diltiazem   . Famotidine   . Hctz [Hydrochlorothiazide]   . Indapamide   . Lansoprazole   . Lisinopril     Mouth problems  . Loratadine   . Maxidex [Dexamethasone]   . Mirapex [Pramipexole Dihydrochloride]   . Norvasc [Amlodipine Besylate]   . Pantoprazole Sodium   . Penicillins   . Prilosec [Omeprazole]   . Procardia [Nifedipine]   . Proton Pump Inhibitors   . Sulfa Drugs Cross Reactors   . Trimethoprim   .  Zyrtec [Cetirizine Hcl]     Patient Active Problem List   Diagnosis Date Noted  . Dyspnea 06/15/2012  . Hyperglycemia 12/27/2011  . Sjogren's syndrome 12/17/2010  . TIA (transient ischemic attack) 12/17/2010  . Benign hypertensive heart disease without heart failure 09/01/2010  . Parkinson's disease 09/01/2010  . Hypothyroidism 09/01/2010  . Hypercholesterolemia 09/01/2010  . Chronic anemia 09/01/2010    History  Smoking status  . Former Smoker -- 0.50 packs/day for 12 years  . Types: Cigarettes  . Quit date: 03/08/1964  Smokeless tobacco  . Not on file    History  Alcohol Use No    Family History  Problem Relation Age of Onset  . Heart disease Mother   . Heart failure Father   . Parkinsonism Father     Review of Systems: Constitutional: no fever chills diaphoresis or fatigue or change in weight.  Head and neck: no hearing loss, no epistaxis, no photophobia or visual disturbance. Respiratory: No cough, shortness of breath or  wheezing. Cardiovascular: No chest pain peripheral edema, palpitations. Gastrointestinal: No abdominal distention, no abdominal pain, no change in bowel habits hematochezia or melena. Genitourinary: No dysuria, no frequency, no urgency, no nocturia. Musculoskeletal:No arthralgias, no back pain, no gait disturbance or myalgias. Neurological: No dizziness, no headaches, no numbness, no seizures, no syncope, no weakness, no tremors. Hematologic: No lymphadenopathy, no easy bruising. Psychiatric: No confusion, no hallucinations, no sleep disturbance.    Physical Exam: Filed Vitals:   11/24/12 0855  BP: 142/68  Pulse: 86   the general appearance reveals an elderly alert woman in no distress.The head and neck exam reveals pupils equal and reactive.  Extraocular movements are full.  There is no scleral icterus.  The mouth and pharynx are normal.  The neck is supple.  The carotids reveal no bruits.  The jugular venous pressure is normal.  The  thyroid is not enlarged.  There is no lymphadenopathy.  The chest is clear to percussion and auscultation.  There are no rales or rhonchi.  Expansion of the chest is symmetrical.  The precordium is quiet.  The first heart sound is normal.  The second heart sound is physiologically split.  There is no murmur gallop rub or click.  There is no abnormal lift or heave.  The abdomen is soft and nontender.  The bowel sounds are normal.  The liver and spleen are not enlarged.  There are no abdominal masses.  There are no abdominal bruits.  Extremities reveal good pedal pulses.  There is no phlebitis and there is trace edema.  There is no cyanosis or clubbing.  Strength is normal and symmetrical in all extremities.  There is no lateralizing weakness.  There are no sensory deficits.  The skin is warm and dry.  There is no rash.  EKG today shows normal sinus rhythm and is within normal limits.   Assessment / Plan: The patient will continue same medication.  Recheck in 6  months for followup office visit.

## 2012-11-24 NOTE — Patient Instructions (Addendum)
Your physician recommends that you continue on your current medications as directed. Please refer to the Current Medication list given to you today.  Your physician wants you to follow-up in: 6 month ov You will receive a reminder letter in the mail two months in advance. If you don't receive a letter, please call our office to schedule the follow-up appointment.  

## 2012-11-24 NOTE — Assessment & Plan Note (Signed)
Blood pressure was remaining stable on current medication.  No headaches dizziness or syncope.  She does have symptoms from her Parkinson's including difficulty getting up from a chair and difficulty starting to walk

## 2012-12-07 ENCOUNTER — Telehealth: Payer: Self-pay | Admitting: Cardiology

## 2012-12-07 NOTE — Telephone Encounter (Signed)
Follow up  ° ° °Patient daughter calling back to speak with nurse.   °

## 2012-12-07 NOTE — Telephone Encounter (Signed)
New Problem  PCP-Dr. Synetta Fail did an EKG for Pt and found that it was not normal. PCP will fax the EKG to our office. Pt's DTR just wants [place] to be aware. DTR request a call back once the EKG has been reviewed.

## 2012-12-07 NOTE — Telephone Encounter (Signed)
Received EKG and  Dr. Patty Sermons reviewed, no changes. Advised patient and daughter. Did offer appointment to daughter and patient, declined.  Advised to call back if anything changes.

## 2013-02-05 ENCOUNTER — Other Ambulatory Visit: Payer: Self-pay | Admitting: Cardiology

## 2013-05-28 ENCOUNTER — Encounter: Payer: Self-pay | Admitting: Cardiology

## 2013-05-28 ENCOUNTER — Ambulatory Visit (INDEPENDENT_AMBULATORY_CARE_PROVIDER_SITE_OTHER): Payer: Medicare Other | Admitting: Cardiology

## 2013-05-28 VITALS — BP 134/69 | HR 86 | Ht 61.0 in | Wt 122.0 lb

## 2013-05-28 DIAGNOSIS — I119 Hypertensive heart disease without heart failure: Secondary | ICD-10-CM

## 2013-05-28 DIAGNOSIS — G459 Transient cerebral ischemic attack, unspecified: Secondary | ICD-10-CM

## 2013-05-28 MED ORDER — SPIRONOLACTONE 25 MG PO TABS
12.5000 mg | ORAL_TABLET | Freq: Every day | ORAL | Status: DC
Start: 2013-05-28 — End: 2013-12-27

## 2013-05-28 NOTE — Progress Notes (Signed)
Victoria Lindsey Date of Birth:  February 23, 1926 [address] [place], [place]  [address] [phone]         Fax   [phone]  History of Present Illness: This pleasant 80 year old woman is seen for a scheduled four-month followup office visit.  She has a history of essential hypertension. She has a past history of a TIAs and is on aspirin and Plavix. She has a history of Parkinson's disease and a history of Sjogren's syndrome. She does not have any known ischemic heart disease and she had a normal nuclear stress test in 2009. She has stage III chronic kidney disease. She also had problems with her urinary bladder. Since last visit her neurologist Dr. Baltazar Najjar in [facility] has switched her to a new patch for Parkinson's disease.  Since last visit she has had no new cardiovascular symptoms.  She brought in some recent labs from her PCP in [place].  Labs were reviewed and are satisfactory except for elevated triglycerides and mild azotemia.  Serum creatinine is 1.2 which is stable and improved.  Potassium is 4.6 and she is on spironolactone.  Current Outpatient Prescriptions  Medication Sig Dispense Refill  . aspirin 81 MG tablet Take 81 mg by mouth daily.        . Blood Glucose Monitoring Suppl (ONE TOUCH ULTRA SYSTEM KIT) W/DEVICE KIT 1 kit by Does not apply route once.      . Calcium Carbonate-Vitamin D (CALCIUM 600 + D PO) Take by mouth daily.      . Cholecalciferol (VITAMIN D3) 2000 UNITS TABS Take by mouth.      . clopidogrel (PLAVIX) 75 MG tablet TAKE 1 TABLET BY MOUTH EVERY DAY  30 tablet  6  . ENSURE PLUS (ENSURE PLUS) LIQD Take 237 mLs by mouth.      . fenofibrate 160 MG tablet Take 160 mg by mouth daily.      Marland Kitchen glucose blood test strip 1 each by Other route as needed for other. Use as instructed      . levothyroxine (SYNTHROID, LEVOTHROID) 100 MCG tablet Take 100 mcg by mouth daily.      . metoprolol tartrate (LOPRESSOR) 25 MG tablet Take 1 tablet (25 mg total) by  mouth 2 (two) times daily.  60 tablet  3  . rasagiline (AZILECT) 1 MG TABS Take 1 mg by mouth daily.      . rosuvastatin (CRESTOR) 10 MG tablet       . rotigotine (NEUPRO) 6 MG/24HR Place 1 patch onto the skin daily.      Marland Kitchen spironolactone (ALDACTONE) 25 MG tablet Take 0.5 tablets (12.5 mg total) by mouth daily.  45 tablet  3   No current facility-administered medications for this visit.    Allergies  Allergen Reactions  . Aciphex [Rabeprazole Sodium]   . Amantadines   . Avapro [Irbesartan]   . Carbidopa-Levodopa   . Cardura [Doxazosin Mesylate]   . Ciprofloxacin   . Clonidine Derivatives   . Cozaar   . Diltiazem   . Famotidine   . Hctz [Hydrochlorothiazide]   . Indapamide   . Lansoprazole   . Lisinopril     Mouth problems  . Loratadine   . Maxidex [Dexamethasone]   . Mirapex [Pramipexole Dihydrochloride]   . Norvasc [Amlodipine Besylate]   . Pantoprazole Sodium   . Penicillins   . Prilosec [Omeprazole]   . Procardia [Nifedipine]   . Proton Pump Inhibitors   . Sulfa Drugs Cross Reactors   .  Trimethoprim   . Zyrtec [Cetirizine Hcl]     Patient Active Problem List   Diagnosis Date Noted  . Dyspnea 06/15/2012  . Hyperglycemia 12/27/2011  . Sjogren's syndrome 12/17/2010  . TIA (transient ischemic attack) 12/17/2010  . Benign hypertensive heart disease without heart failure 09/01/2010  . Parkinson's disease 09/01/2010  . Hypothyroidism 09/01/2010  . Hypercholesterolemia 09/01/2010  . Chronic anemia 09/01/2010    History  Smoking status  . Former Smoker -- 0.50 packs/day for 12 years  . Types: Cigarettes  . Quit date: 03/08/1964  Smokeless tobacco  . Not on file    History  Alcohol Use No    Family History  Problem Relation Age of Onset  . Heart disease Mother   . Heart failure Father   . Parkinsonism Father     Review of Systems: Constitutional: no fever chills diaphoresis or fatigue or change in weight.  Head and neck: no hearing loss, no  epistaxis, no photophobia or visual disturbance. Respiratory: No cough, shortness of breath or wheezing. Cardiovascular: No chest pain peripheral edema, palpitations. Gastrointestinal: No abdominal distention, no abdominal pain, no change in bowel habits hematochezia or melena. Genitourinary: No dysuria, no frequency, no urgency, no nocturia. Musculoskeletal:No arthralgias, no back pain, no gait disturbance or myalgias. Neurological: No dizziness, no headaches, no numbness, no seizures, no syncope, no weakness, no tremors. Hematologic: No lymphadenopathy, no easy bruising. Psychiatric: No confusion, no hallucinations, no sleep disturbance.    Physical Exam: Filed Vitals:   05/28/13 1429  BP: 134/69  Pulse: 86   the general appearance reveals an elderly alert woman in no distress.The head and neck exam reveals pupils equal and reactive.  Extraocular movements are full.  There is no scleral icterus.  The mouth and pharynx are normal.  The neck is supple.  The carotids reveal no bruits.  The jugular venous pressure is normal.  The  thyroid is not enlarged.  There is no lymphadenopathy.  The chest is clear to percussion and auscultation.  There are no rales or rhonchi.  Expansion of the chest is symmetrical.  The precordium is quiet.  The first heart sound is normal.  The second heart sound is physiologically split.  There is no murmur gallop rub or click.  There is no abnormal lift or heave.  The abdomen is soft and nontender.  The bowel sounds are normal.  The liver and spleen are not enlarged.  There are no abdominal masses.  There are no abdominal bruits.  Extremities reveal good pedal pulses.  There is no phlebitis and there is trace edema.  There is no cyanosis or clubbing.  Strength is normal and symmetrical in all extremities.  There is no lateralizing weakness.  There are no sensory deficits.  The skin is warm and dry.  There is no rash.     Assessment / Plan: The patient will continue  same medication.  Recheck in 6 months for followup office visit and EKG.  We refilled her spironolactone today

## 2013-05-28 NOTE — Assessment & Plan Note (Signed)
The patient is getting physical therapy for her Parkinson's disease.

## 2013-05-28 NOTE — Assessment & Plan Note (Signed)
Patient is not having any symptoms of congestive heart failure.  No dizziness or syncope or palpitations.

## 2013-05-28 NOTE — Assessment & Plan Note (Signed)
The patient is on aspirin and Plavix.  She's had no recurrent TIAs.  She does have some mild easy bruising of her extremities.

## 2013-05-28 NOTE — Patient Instructions (Signed)
Your physician recommends that you continue on your current medications as directed. Please refer to the Current Medication list given to you today.  Your physician wants you to follow-up in: 6 month ov/ekg You will receive a reminder letter in the mail two months in advance. If you don't receive a letter, please call our office to schedule the follow-up appointment.  

## 2013-06-30 ENCOUNTER — Other Ambulatory Visit: Payer: Self-pay | Admitting: Cardiology

## 2013-08-31 ENCOUNTER — Telehealth: Payer: Self-pay | Admitting: Cardiology

## 2013-08-31 MED ORDER — METOPROLOL TARTRATE 50 MG PO TABS: 50.0000 mg | ORAL_TABLET | Freq: Two times a day (BID) | ORAL | Status: DC

## 2013-08-31 NOTE — Telephone Encounter (Signed)
Dr Martinique DOD aware of pt's BP and recommends for pt to increased the Metoprolol to 50 mg twice a day. Pt aware a prescription sent to [facility] pharmacy. Pt aware she verbalized understanding.

## 2013-08-31 NOTE — Telephone Encounter (Signed)
New message          Pt c/o of bp being 160. Pt would like for a nurse to call her back.

## 2013-08-31 NOTE — Telephone Encounter (Signed)
Pt states that for the last few days her BP has been high. It has been running between 157/66 to 161/60. Pt's BP now is 152/69. Pt took her BP medication this AM. Lopressor 25 mg which she takes twice a day and Aldactone 12.5 mg once a day.

## 2013-09-03 ENCOUNTER — Other Ambulatory Visit: Payer: Self-pay | Admitting: Cardiology

## 2013-09-05 DIAGNOSIS — N3289 Other specified disorders of bladder: Secondary | ICD-10-CM | POA: Insufficient documentation

## 2013-09-05 DIAGNOSIS — R3915 Urgency of urination: Secondary | ICD-10-CM | POA: Insufficient documentation

## 2013-09-06 DIAGNOSIS — G20 Parkinson's disease: Secondary | ICD-10-CM | POA: Insufficient documentation

## 2013-10-12 DIAGNOSIS — N390 Urinary tract infection, site not specified: Secondary | ICD-10-CM | POA: Insufficient documentation

## 2013-10-17 ENCOUNTER — Telehealth: Payer: Self-pay | Admitting: Cardiology

## 2013-10-17 NOTE — Telephone Encounter (Signed)
New problem:     [name] called to ask for an appt because pt's BP is up sytolic has been    697/   160/      170/  [place].    Daughter would like a call back.

## 2013-10-17 NOTE — Telephone Encounter (Signed)
Left message to call back  

## 2013-10-17 NOTE — Telephone Encounter (Signed)
Spoke with daughter and patient blood pressure has been running high. Daughter wanted patient to be seen instead of adjusting over the phone. Scheduled ov for tomorrow.

## 2013-10-18 ENCOUNTER — Encounter: Payer: Self-pay | Admitting: Cardiology

## 2013-10-18 ENCOUNTER — Ambulatory Visit
Admission: RE | Admit: 2013-10-18 | Discharge: 2013-10-18 | Disposition: A | Discharge status: Home / Self Care | Payer: Medicare Other | Source: Ambulatory Visit | Attending: Cardiology | Admitting: Cardiology

## 2013-10-18 ENCOUNTER — Ambulatory Visit (INDEPENDENT_AMBULATORY_CARE_PROVIDER_SITE_OTHER): Payer: Medicare Other | Admitting: Cardiology

## 2013-10-18 VITALS — BP 175/89 | HR 83 | Ht 61.0 in | Wt 120.0 lb

## 2013-10-18 DIAGNOSIS — R0600 Dyspnea, unspecified: Secondary | ICD-10-CM

## 2013-10-18 DIAGNOSIS — I119 Hypertensive heart disease without heart failure: Secondary | ICD-10-CM

## 2013-10-18 DIAGNOSIS — R0609 Other forms of dyspnea: Principal | ICD-10-CM

## 2013-10-18 DIAGNOSIS — R0989 Other specified symptoms and signs involving the circulatory and respiratory systems: Secondary | ICD-10-CM

## 2013-10-18 LAB — CBC WITH DIFFERENTIAL/PLATELET
BASOS ABS: 0 10*3/uL (ref 0.0–0.1)
Basophils Relative: 0.3 % (ref 0.0–3.0)
EOS ABS: 0.2 10*3/uL (ref 0.0–0.7)
EOS PCT: 3.2 % (ref 0.0–5.0)
HEMATOCRIT: 33.3 % — AB (ref 36.0–46.0)
Hemoglobin: 11.2 g/dL — ABNORMAL LOW (ref 12.0–15.0)
LYMPHS ABS: 1.5 10*3/uL (ref 0.7–4.0)
Lymphocytes Relative: 27.2 % (ref 12.0–46.0)
MCHC: 33.7 g/dL (ref 30.0–36.0)
MCV: 97.5 fl (ref 78.0–100.0)
MONO ABS: 0.9 10*3/uL (ref 0.1–1.0)
Monocytes Relative: 15.2 % — ABNORMAL HIGH (ref 3.0–12.0)
Neutro Abs: 3 10*3/uL (ref 1.4–7.7)
Neutrophils Relative %: 54.1 % (ref 43.0–77.0)
Platelets: 142 10*3/uL — ABNORMAL LOW (ref 150.0–400.0)
RBC: 3.41 Mil/uL — ABNORMAL LOW (ref 3.87–5.11)
RDW: 14.9 % (ref 11.5–15.5)
WBC: 5.6 10*3/uL (ref 4.0–10.5)

## 2013-10-18 NOTE — Assessment & Plan Note (Signed)
The patient has been experiencing some dyspnea.  We will recheck her echocardiogram and her chest x-ray.  Her EKG today shows new T-wave inversions in the anterolateral leads.  She has not been experiencing any chest pain or angina.

## 2013-10-18 NOTE — Progress Notes (Signed)
Victoria Lindsey Date of Birth:  [birth year] [name] [name], [place]  [address] [phone]        Fax   [phone]   History of Present Illness: This pleasant 80 year old woman is seen for a work in four-month followup office visit.  She comes in because of recent elevation of her blood pressure. She has a history of essential hypertension. She has a past history of a TIAs and is on aspirin and Plavix. She has a history of Parkinson's disease and a history of Sjogren's syndrome. She does not have any known ischemic heart disease and she had a normal nuclear stress test in 2009.  Her last echocardiogram on 06/05/12 showed mild LVH with grade 1 diastolic dysfunction and an ejection fraction of 60-65%.  There was mild aortic insufficiency and mild mitral regurgitation.  Her last chest x-ray in March 2014 showed borderline cardiomegaly She has stage III chronic kidney disease. She also had problems with her urinary bladder. Since last visit her neurologist Dr. Baltazar Najjar in [facility] has switched her to a new patch for Parkinson's disease. Since last visit she has had no new cardiovascular symptoms.  She fell in July and suffered a compression fracture of her spine.  Over the past month she has had problems with recurrent urinary infections.  [name] has been coming out to the house and checking her blood pressures.   Current Outpatient Prescriptions  Medication Sig Dispense Refill  . aspirin 81 MG tablet Take 81 mg by mouth daily.        Marland Kitchen BIOTIN PO Take 1,000 mcg by mouth.      . Blood Glucose Monitoring Suppl (ONE TOUCH ULTRA SYSTEM KIT) W/DEVICE KIT 1 kit by Does not apply route once.      . Cholecalciferol (VITAMIN D3) 2000 UNITS TABS Take by mouth.      . clopidogrel (PLAVIX) 75 MG tablet TAKE 1 TABLET BY MOUTH DAILY  30 tablet  2  . Cranberry 500 MG CAPS Take by mouth.      . ENSURE PLUS (ENSURE PLUS) LIQD Take 237 mLs by mouth.      . FOLIC ACID PO  Take 0.6 mg by mouth.      Marland Kitchen glucose blood test strip 1 each by Other route as needed for other. Use as instructed      . levothyroxine (SYNTHROID, LEVOTHROID) 100 MCG tablet Take 100 mcg by mouth daily.      . metoprolol (LOPRESSOR) 50 MG tablet Take 1 tablet (50 mg total) by mouth 2 (two) times daily.  60 tablet  6  . rasagiline (AZILECT) 1 MG TABS Take 1 mg by mouth daily.      . rosuvastatin (CRESTOR) 10 MG tablet       . Rotigotine 8 MG/24HR PT24 Place onto the skin.      Marland Kitchen spironolactone (ALDACTONE) 25 MG tablet Take 0.5 tablets (12.5 mg total) by mouth daily.  45 tablet  3   No current facility-administered medications for this visit.    Allergies  Allergen Reactions  . Aciphex [Rabeprazole Sodium]   . Amantadines   . Avapro [Irbesartan]   . Carbidopa-Levodopa   . Cardura [Doxazosin Mesylate]   . Ciprofloxacin   . Clonidine Derivatives   . Cozaar   . Diltiazem   . Famotidine   . Hctz [Hydrochlorothiazide]   . Indapamide   . Lansoprazole   . Lisinopril     Mouth  problems  . Loratadine   . Maxidex [Dexamethasone]   . Mirapex [Pramipexole Dihydrochloride]   . Norvasc [Amlodipine Besylate]   . Pantoprazole Sodium   . Penicillins   . Prilosec [Omeprazole]   . Procardia [Nifedipine]   . Proton Pump Inhibitors   . Sulfa Drugs Cross Reactors   . Trimethoprim   . Zyrtec [Cetirizine Hcl]     Patient Active Problem List   Diagnosis Date Noted  . Dyspnea 06/15/2012  . Hyperglycemia 12/27/2011  . Sjogren's syndrome 12/17/2010  . TIA (transient ischemic attack) 12/17/2010  . Benign hypertensive heart disease without heart failure 09/01/2010  . Parkinson's disease 09/01/2010  . Hypothyroidism 09/01/2010  . Hypercholesterolemia 09/01/2010  . Chronic anemia 09/01/2010    History  Smoking status  . Former Smoker -- 0.50 packs/day for 12 years  . Types: Cigarettes  . Quit date: 03/08/1964  Smokeless tobacco  . Not on file    History  Alcohol Use No    Family  History  Problem Relation Age of Onset  . Heart disease Mother   . Heart failure Father   . Parkinsonism Father     Review of Systems: Constitutional: no fever chills diaphoresis or fatigue or change in weight.  Head and neck: no hearing loss, no epistaxis, no photophobia or visual disturbance. Respiratory: No cough, shortness of breath or wheezing. Cardiovascular: No chest pain peripheral edema, palpitations. Gastrointestinal: No abdominal distention, no abdominal pain, no change in bowel habits hematochezia or melena. Genitourinary: No dysuria, no frequency, no urgency, no nocturia. Musculoskeletal:No arthralgias, no back pain, no gait disturbance or myalgias. Neurological: No dizziness, no headaches, no numbness, no seizures, no syncope, no weakness, no tremors. Hematologic: No lymphadenopathy, no easy bruising. Psychiatric: No confusion, no hallucinations, no sleep disturbance.    Physical Exam: Filed Vitals:   10/18/13 1346  BP: 175/89  Pulse: 83   general appearance reveals a well-developed well-nourished elderly woman in no distress.  Repeat blood pressure by me was 152/72 right arm.The head and neck exam reveals pupils equal and reactive.  Extraocular movements are full.  There is no scleral icterus.  The mouth and pharynx are normal.  The neck is supple.  The carotids reveal no bruits.  The jugular venous pressure is normal.  The  thyroid is not enlarged.  There is no lymphadenopathy.  The chest reveals inspiratory rales. Expansion of the chest is symmetrical.  The precordium is quiet.  The first heart sound is normal.  The second heart sound is physiologically split.  There is no murmur gallop rub or click.  There is no abnormal lift or heave.  The abdomen is soft and nontender.  The bowel sounds are normal.  The liver and spleen are not enlarged.  There are no abdominal masses.  There are no abdominal bruits.  Extremities reveal good pedal pulses.  There is no phlebitis or edema.   There is no cyanosis or clubbing.  Strength is normal and symmetrical in all extremities.  There is no lateralizing weakness.  There are no sensory deficits.  The skin is warm and dry.  There is no rash.  EKG shows normal sinus rhythm and LVH with strain, new since prior tracing   Assessment / Plan: 1.  Essential hypertension 2. Dyspnea 3. past history of TIAs 4. Parkinsonism 5. recent compression fracture from a fall in July 20 15th 6. chronic kidney disease stage III  Plan: No change in meds today.  Arrange for chest x-ray.  Arrange   for 2-D echo.  Obtain CBC and basal metabolic panel today Keep September appointment as scheduled.

## 2013-10-18 NOTE — Assessment & Plan Note (Signed)
We checked her blood pressure today and it was mildly elevated at 152/72.  We checked her machine and it registered very similar.  This means that her machine is accurate.  We are checking lab work today.  We will consider increasing her spironolactone but need to know about her renal function and potassium first

## 2013-10-18 NOTE — Patient Instructions (Signed)
Will obtain labs today and call you with the results (bmet/cbc)  Your physician recommends that you continue on your current medications as directed. Please refer to the Current Medication list given to you today.  Your physician has requested that you have an echocardiogram. Echocardiography is a painless test that uses sound waves to create images of your heart. It provides your doctor with information about the size and shape of your heart and how well your heart's chambers and valves are working. This procedure takes approximately one hour. There are no restrictions for this procedure.  A chest x-ray takes a picture of the organs and structures inside the chest, including the heart, lungs, and blood vessels. This test can show several things, including, whether the heart is enlarges; whether fluid is building up in the lungs; and whether pacemaker / defibrillator leads are still in place. [place]  Keep your September appointment

## 2013-10-19 ENCOUNTER — Telehealth: Payer: Self-pay | Admitting: *Deleted

## 2013-10-19 DIAGNOSIS — R0600 Dyspnea, unspecified: Secondary | ICD-10-CM

## 2013-10-19 NOTE — Telephone Encounter (Signed)
Message copied by Earvin Hansen on Fri Oct 19, 2013  7:30 PM ------      Message from: [name]      Created: Fri Oct 19, 2013  7:52 AM       Please report.  The CBC is stable.  I believe she was also supposed to have a BMET? ------

## 2013-10-19 NOTE — Telephone Encounter (Signed)
Advised patient and scheduled BMET for next week, left message on daughters machine

## 2013-10-23 ENCOUNTER — Other Ambulatory Visit (INDEPENDENT_AMBULATORY_CARE_PROVIDER_SITE_OTHER): Payer: Medicare Other

## 2013-10-23 DIAGNOSIS — R0600 Dyspnea, unspecified: Secondary | ICD-10-CM

## 2013-10-23 DIAGNOSIS — R0609 Other forms of dyspnea: Secondary | ICD-10-CM

## 2013-10-23 DIAGNOSIS — R0989 Other specified symptoms and signs involving the circulatory and respiratory systems: Secondary | ICD-10-CM

## 2013-10-23 LAB — BASIC METABOLIC PANEL
BUN: 24 mg/dL — ABNORMAL HIGH (ref 6–23)
CALCIUM: 9.3 mg/dL (ref 8.4–10.5)
CO2: 27 mEq/L (ref 19–32)
Chloride: 100 mEq/L (ref 96–112)
Creatinine, Ser: 1 mg/dL (ref 0.4–1.2)
GFR: 56.56 mL/min — ABNORMAL LOW (ref >60.00)
GLUCOSE: 131 mg/dL — AB (ref 70–99)
POTASSIUM: 4.3 meq/L (ref 3.5–5.1)
Sodium: 135 mEq/L (ref 135–145)

## 2013-10-23 NOTE — Progress Notes (Signed)
Quick Note:  Please report to patient. The recent labs are stable. Continue same medication and careful diet. Kidney function is better. ______ 

## 2013-10-25 ENCOUNTER — Telehealth: Payer: Self-pay | Admitting: *Deleted

## 2013-10-25 ENCOUNTER — Ambulatory Visit (HOSPITAL_COMMUNITY): Payer: Medicare Other | Attending: Internal Medicine

## 2013-10-25 DIAGNOSIS — R0602 Shortness of breath: Secondary | ICD-10-CM | POA: Diagnosis present

## 2013-10-25 DIAGNOSIS — I519 Heart disease, unspecified: Secondary | ICD-10-CM | POA: Insufficient documentation

## 2013-10-25 DIAGNOSIS — R0609 Other forms of dyspnea: Secondary | ICD-10-CM

## 2013-10-25 NOTE — Progress Notes (Signed)
2D Echo completed. 10/25/2013 

## 2013-10-25 NOTE — Telephone Encounter (Signed)
pt's daughter [name] has been made aware of lab results from [place] 8/13 and bmet 8/18, with verbal understanding.

## 2013-10-26 ENCOUNTER — Telehealth: Payer: Self-pay | Admitting: *Deleted

## 2013-10-26 MED ORDER — METOPROLOL TARTRATE 50 MG PO TABS: 75.0000 mg | ORAL_TABLET | Freq: Two times a day (BID) | ORAL | Status: DC

## 2013-10-26 NOTE — Telephone Encounter (Signed)
pt's daughter [name] cb and has been made aware of echo results and to increase metoprolol to 75 mg bid, new Rx sent in [place]. [name] verbalized Plan of Care.

## 2013-11-27 ENCOUNTER — Encounter: Payer: Self-pay | Admitting: Cardiology

## 2013-11-27 ENCOUNTER — Ambulatory Visit (INDEPENDENT_AMBULATORY_CARE_PROVIDER_SITE_OTHER): Payer: Medicare Other | Admitting: Cardiology

## 2013-11-27 VITALS — BP 138/62 | HR 74 | Ht 59.0 in | Wt 119.0 lb

## 2013-11-27 DIAGNOSIS — R0600 Dyspnea, unspecified: Secondary | ICD-10-CM

## 2013-11-27 DIAGNOSIS — R0609 Other forms of dyspnea: Secondary | ICD-10-CM

## 2013-11-27 DIAGNOSIS — G459 Transient cerebral ischemic attack, unspecified: Secondary | ICD-10-CM

## 2013-11-27 DIAGNOSIS — I119 Hypertensive heart disease without heart failure: Secondary | ICD-10-CM

## 2013-11-27 DIAGNOSIS — R0989 Other specified symptoms and signs involving the circulatory and respiratory systems: Secondary | ICD-10-CM

## 2013-11-27 NOTE — Assessment & Plan Note (Signed)
Patient has a chronic dyspnea.  She reports that she has had some spirometry tests one of which suggested restrictive lung disease and another suggested obstructive lung disease.  She states that she will be getting referred to a pulmonary specialist by her PCP.  Since we last saw her she did have a chest x-ray which did not show any evidence of congestive heart failure.

## 2013-11-27 NOTE — Patient Instructions (Signed)
Your physician recommends that you continue on your current medications as directed. Please refer to the Current Medication list given to you today.  Your physician recommends that you schedule a follow-up appointment in: 3 month ov/ekg 

## 2013-11-27 NOTE — Assessment & Plan Note (Signed)
Her daughter who is a Software engineer brought in a list of her home blood pressure readings.  They have been satisfactory since she went out to 75 mg twice a day on her metoprolol tartrate.

## 2013-11-27 NOTE — Progress Notes (Signed)
Victoria Lindsey Date of Birth:  [birth year] Victoria Lindsey [address] [place], [place]  [address] [phone]        Fax   [phone]   History of Present Illness: This pleasant 80 year old woman is seen for a work in  followup office visit.  She was seen in mid August 2015 because of recent elevation of her blood pressure.  She has a history of essential hypertension. She has a past history of a TIAs and is on aspirin and Plavix. She has a history of Parkinson's disease and a history of Sjogren's syndrome. She does not have any known ischemic heart disease and she had a normal nuclear stress test in 2009.  Marland Kitchen  There was mild aortic insufficiency and mild mitral regurgitation.  Her last chest x-ray in March 2014 showed borderline cardiomegaly She has stage III chronic kidney disease. She also had problems with her urinary bladder. Since last visit her neurologist Dr. Baltazar Najjar in [facility] has switched her to a new patch for Parkinson's disease. Since last visit she has had no new cardiovascular symptoms.  She fell in July and suffered a compression fracture of her spine.  Over the past month she has had problems with recurrent urinary infections.  She is now taking Keflex 250 mg each night for the next 90 days.  Victoria Lindsey has been coming out to the house and checking her blood pressures.  Her last echocardiogram was 10/25/13 and showed a hyperdynamic left ventricle with ejection fraction 75-80%.  Since that at no she has been on a higher dose of beta blocker and has felt better.   Current Outpatient Prescriptions  Medication Sig Dispense Refill  . aspirin 81 MG tablet Take 81 mg by mouth daily.        Marland Kitchen BIOTIN PO Take 1,000 mcg by mouth.      . Blood Glucose Monitoring Suppl (ONE TOUCH ULTRA SYSTEM KIT) W/DEVICE KIT 1 kit by Does not apply route once.      . Calcium-Magnesium-Vitamin D 546-27-035 MG-MG-UNIT CHEW Chew by mouth.      . cephALEXin (KEFLEX) 250 MG capsule Take 250  mg by mouth. Take 1 at bedtime.      . Cholecalciferol (VITAMIN D3) 2000 UNITS TABS Take by mouth.      . clopidogrel (PLAVIX) 75 MG tablet TAKE 1 TABLET BY MOUTH DAILY  30 tablet  2  . Cranberry 500 MG CAPS Take by mouth.      . Cranberry-Vitamin C-Vitamin E (CRANBERRY PLUS VITAMIN C) 4200-20-3 MG-MG-UNIT CAPS Take by mouth.      . docusate calcium (SURFAK) 240 MG capsule Take 240 mg by mouth daily.      Marland Kitchen ENSURE PLUS (ENSURE PLUS) LIQD Take 237 mLs by mouth.      . FOLIC ACID PO Take 0.6 mg by mouth.      Marland Kitchen glucose blood test strip 1 each by Other route as needed for other. Use as instructed      . levothyroxine (SYNTHROID, LEVOTHROID) 100 MCG tablet Take 100 mcg by mouth daily.      . metoprolol (LOPRESSOR) 50 MG tablet Take 1.5 tablets (75 mg total) by mouth 2 (two) times daily.  90 tablet  11  . Probiotic Product (ALIGN PO) Take by mouth.      . rasagiline (AZILECT) 1 MG TABS Take 1 mg by mouth daily.      . rosuvastatin (CRESTOR) 10 MG tablet       .  Rotigotine 8 MG/24HR PT24 Place onto the skin.      Marland Kitchen spironolactone (ALDACTONE) 25 MG tablet Take 0.5 tablets (12.5 mg total) by mouth daily.  45 tablet  3   No current facility-administered medications for this visit.    Allergies  Allergen Reactions  . Aciphex [Rabeprazole Sodium]   . Amantadines   . Avapro [Irbesartan]   . Carbidopa-Levodopa   . Cardura [Doxazosin Mesylate]   . Ciprofloxacin   . Clonidine Derivatives   . Cozaar   . Diltiazem   . Famotidine   . Hctz [Hydrochlorothiazide]   . Indapamide   . Lansoprazole   . Lisinopril     Mouth problems  . Loratadine   . Maxidex [Dexamethasone]   . Mirapex [Pramipexole Dihydrochloride]   . Norvasc [Amlodipine Besylate]   . Pantoprazole Sodium   . Penicillins   . Prilosec [Omeprazole]   . Procardia [Nifedipine]   . Proton Pump Inhibitors   . Sulfa Drugs Cross Reactors   . Trimethoprim   . Zyrtec [Cetirizine Hcl]     Patient Active Problem List   Diagnosis Date  Noted  . Dyspnea 06/15/2012  . Hyperglycemia 12/27/2011  . Sjogren's syndrome 12/17/2010  . TIA (transient ischemic attack) 12/17/2010  . Benign hypertensive heart disease without heart failure 09/01/2010  . Parkinson's disease 09/01/2010  . Hypothyroidism 09/01/2010  . Hypercholesterolemia 09/01/2010  . Chronic anemia 09/01/2010    History  Smoking status  . Former Smoker -- 0.50 packs/day for 12 years  . Types: Cigarettes  . Quit date: 03/08/1964  Smokeless tobacco  . Not on file    History  Alcohol Use No    Family History  Problem Relation Age of Onset  . Heart disease Mother   . Heart failure Father   . Parkinsonism Father     Review of Systems: Constitutional: no fever chills diaphoresis or fatigue or change in weight.  Head and neck: no hearing loss, no epistaxis, no photophobia or visual disturbance. Respiratory: No cough, shortness of breath or wheezing. Cardiovascular: No chest pain peripheral edema, palpitations. Gastrointestinal: No abdominal distention, no abdominal pain, no change in bowel habits hematochezia or melena. Genitourinary: No dysuria, no frequency, no urgency, no nocturia. Musculoskeletal:No arthralgias, no back pain, no gait disturbance or myalgias. Neurological: No dizziness, no headaches, no numbness, no seizures, no syncope, no weakness, no tremors. Hematologic: No lymphadenopathy, no easy bruising. Psychiatric: No confusion, no hallucinations, no sleep disturbance.    Physical Exam: Filed Vitals:   11/27/13 1110  BP: 138/62  Pulse: 74   general appearance reveals a well-developed well-nourished elderly woman in no distress.  Repeat blood pressure by me was 152/72 right arm.The head and neck exam reveals pupils equal and reactive.  Extraocular movements are full.  There is no scleral icterus.  The mouth and pharynx are normal.  The neck is supple.  The carotids reveal no bruits.  The jugular venous pressure is normal.  The  thyroid is  not enlarged.  There is no lymphadenopathy.  The chest reveals mild expiratory rales anteriorly but not posteriorly. Expansion of the chest is symmetrical.  The precordium is quiet.  The first heart sound is normal.  The second heart sound is physiologically split.  There is no murmur gallop rub or click.  There is no abnormal lift or heave.  The abdomen is soft and nontender.  The bowel sounds are normal.  The liver and spleen are not enlarged.  There are no abdominal masses.  There are no abdominal bruits.  Extremities reveal good pedal pulses.  There is no phlebitis or edema.  There is no cyanosis or clubbing.  Strength is normal and symmetrical in all extremities.  There is no lateralizing weakness.  There are no sensory deficits.  The skin is warm and dry.  There is no rash.     Assessment / Plan: 1.  Essential hypertension with hyperdynamic left ventricular ejection fraction 75-80% prior to increasing beta blocker dose. 2. Dyspnea 3. past history of TIAs 4. Parkinsonism 5. recent compression fracture from a fall in July 20 15th 6. chronic kidney disease stage III  Plan: No change in meds.  Recheck in 3 months for office visit and EKG.

## 2013-11-27 NOTE — Assessment & Plan Note (Signed)
The patient is followed by neurology for her Parkinson's disease.  It is possible that some of her perceived dyspnea is related to the Parkinson's disease also

## 2013-11-27 NOTE — Assessment & Plan Note (Signed)
The patient has not had any TIA spells since last visit.  She is having some difficulty with swallowing and she is now on a nectar thick diet following a modified barium swallow

## 2013-12-03 ENCOUNTER — Other Ambulatory Visit: Payer: Self-pay | Admitting: Cardiology

## 2013-12-04 ENCOUNTER — Encounter: Payer: Self-pay | Admitting: Pulmonary Disease

## 2013-12-04 ENCOUNTER — Ambulatory Visit (INDEPENDENT_AMBULATORY_CARE_PROVIDER_SITE_OTHER): Payer: Medicare Other | Admitting: Pulmonary Disease

## 2013-12-04 ENCOUNTER — Ambulatory Visit (INDEPENDENT_AMBULATORY_CARE_PROVIDER_SITE_OTHER)
Admission: RE | Admit: 2013-12-04 | Discharge: 2013-12-04 | Disposition: A | Discharge status: Home / Self Care | Payer: Medicare Other | Source: Ambulatory Visit | Attending: Pulmonary Disease | Admitting: Pulmonary Disease

## 2013-12-04 VITALS — BP 120/80 | HR 80 | Temp 97.1°F | Ht 61.0 in | Wt 118.0 lb

## 2013-12-04 DIAGNOSIS — R0989 Other specified symptoms and signs involving the circulatory and respiratory systems: Secondary | ICD-10-CM

## 2013-12-04 DIAGNOSIS — R0609 Other forms of dyspnea: Secondary | ICD-10-CM

## 2013-12-04 DIAGNOSIS — R0600 Dyspnea, unspecified: Secondary | ICD-10-CM

## 2013-12-04 MED ORDER — IOHEXOL 350 MG/ML SOLN
80.0000 mL | Freq: Once | INTRAVENOUS | Status: AC | PRN
  Administered 2013-12-04: 80 mL via INTRAVENOUS

## 2013-12-04 NOTE — Assessment & Plan Note (Signed)
The patient's dyspnea on exertion is clearly multifactorial, and today I have not found anything specific from a pulmonary standpoint to explain her symptoms. She does not have airflow obstruction by PFTs, but she does have an elevated left hemidiaphragm that is not paralyzed by sniff testing, but does not move normally. This results in chronic left basilar atelectasis. We have not evaluated her for thromboembolic disease, and because of her worsening symptoms, I think we need to do this. This will also allow [place] to evaluate for possible ongoing aspiration with changes in the lung bases. Again, I think her symptoms are multifactorial, with contributions from her debility, age, and chronic anemia.

## 2013-12-04 NOTE — Patient Instructions (Signed)
Will schedule for scan of your chest to evaluate for possible blood clots, and also to see if you may have issues with aspiration.  Continue to stay as active as possible.

## 2013-12-04 NOTE — Progress Notes (Signed)
   Subjective:    Patient ID: Victoria Lindsey, female    DOB: 10/14/1925, 80 y.o.   MRN: [id number]  HPI Patient comes in today for reevaluation of her dyspnea on exertion.  She was seen approximately 18 months ago and had a thorough pulmonary evaluation, with the only thing being found was a dysfunctional left hemidiaphragm. There was also concerns about the possibility of bulbar dysfunction related to her Parkinson's disease with microaspiration. She comes in today where she feels that her dyspnea on exertion has gotten worse, and she is trying to follow some type of swallowing recommendations from speech. She denies any cough, congestion, or mucus. She has not had any worsening lower extremity edema. She has had a recent chest x-ray that is totally unchanged from last year, and continues to show her elevated left hemidiaphragm.   Review of Systems  Constitutional: Negative for fever and unexpected weight change.  HENT: Positive for congestion, postnasal drip and rhinorrhea. Negative for dental problem, ear pain, nosebleeds, sinus pressure, sneezing, sore throat and trouble swallowing.   Eyes: Negative for redness and itching.  Respiratory: Positive for cough, shortness of breath and wheezing. Negative for chest tightness.   Cardiovascular: Positive for leg swelling. Negative for palpitations.  Gastrointestinal: Negative for nausea and vomiting.  Genitourinary: Negative for dysuria.  Musculoskeletal: Negative for joint swelling.  Skin: Negative for rash.  Neurological: Positive for headaches.  Hematological: Does not bruise/bleed easily.  Psychiatric/Behavioral: Negative for dysphoric mood. The patient is not nervous/anxious.        Objective:   Physical Exam Thin and frail appearing female in nad Nose without purulence or d/c noted Neck without LN or TMG Chest with mild basilar crackles, decreased bs in left base Cor with rrr LE with mild edema, no cyanosis Alert and oriented, moves  all 4.        Assessment & Plan:

## 2013-12-14 ENCOUNTER — Ambulatory Visit: Payer: Medicare Other | Admitting: Pulmonary Disease

## 2013-12-15 DIAGNOSIS — R9431 Abnormal electrocardiogram [ECG] [EKG]: Secondary | ICD-10-CM | POA: Insufficient documentation

## 2013-12-15 DIAGNOSIS — R0689 Other abnormalities of breathing: Secondary | ICD-10-CM | POA: Insufficient documentation

## 2013-12-15 DIAGNOSIS — M3500 Sicca syndrome, unspecified: Secondary | ICD-10-CM | POA: Insufficient documentation

## 2013-12-15 DIAGNOSIS — G2581 Restless legs syndrome: Secondary | ICD-10-CM | POA: Insufficient documentation

## 2013-12-15 DIAGNOSIS — K219 Gastro-esophageal reflux disease without esophagitis: Secondary | ICD-10-CM | POA: Insufficient documentation

## 2013-12-15 DIAGNOSIS — I2510 Atherosclerotic heart disease of native coronary artery without angina pectoris: Secondary | ICD-10-CM | POA: Insufficient documentation

## 2013-12-15 DIAGNOSIS — L84 Corns and callosities: Secondary | ICD-10-CM | POA: Insufficient documentation

## 2013-12-15 DIAGNOSIS — N183 Chronic kidney disease, stage 3 unspecified: Secondary | ICD-10-CM | POA: Insufficient documentation

## 2013-12-15 DIAGNOSIS — I7300 Raynaud's syndrome without gangrene: Secondary | ICD-10-CM | POA: Insufficient documentation

## 2013-12-15 DIAGNOSIS — R259 Unspecified abnormal involuntary movements: Secondary | ICD-10-CM | POA: Insufficient documentation

## 2013-12-15 DIAGNOSIS — B351 Tinea unguium: Secondary | ICD-10-CM | POA: Insufficient documentation

## 2013-12-15 DIAGNOSIS — C50919 Malignant neoplasm of unspecified site of unspecified female breast: Secondary | ICD-10-CM | POA: Insufficient documentation

## 2013-12-15 DIAGNOSIS — M8580 Other specified disorders of bone density and structure, unspecified site: Secondary | ICD-10-CM | POA: Insufficient documentation

## 2013-12-15 DIAGNOSIS — E785 Hyperlipidemia, unspecified: Secondary | ICD-10-CM | POA: Insufficient documentation

## 2013-12-15 DIAGNOSIS — R350 Frequency of micturition: Secondary | ICD-10-CM | POA: Insufficient documentation

## 2013-12-15 DIAGNOSIS — IMO0002 Reserved for concepts with insufficient information to code with codable children: Secondary | ICD-10-CM | POA: Insufficient documentation

## 2013-12-15 DIAGNOSIS — K635 Polyp of colon: Secondary | ICD-10-CM | POA: Insufficient documentation

## 2013-12-15 DIAGNOSIS — E119 Type 2 diabetes mellitus without complications: Secondary | ICD-10-CM | POA: Insufficient documentation

## 2013-12-15 DIAGNOSIS — R079 Chest pain, unspecified: Secondary | ICD-10-CM | POA: Insufficient documentation

## 2013-12-15 DIAGNOSIS — I10 Essential (primary) hypertension: Secondary | ICD-10-CM | POA: Insufficient documentation

## 2013-12-15 DIAGNOSIS — N390 Urinary tract infection, site not specified: Secondary | ICD-10-CM | POA: Insufficient documentation

## 2013-12-21 ENCOUNTER — Observation Stay (HOSPITAL_COMMUNITY): Payer: Medicare Other

## 2013-12-21 ENCOUNTER — Inpatient Hospital Stay (HOSPITAL_COMMUNITY): Payer: Medicare Other

## 2013-12-21 ENCOUNTER — Encounter (HOSPITAL_COMMUNITY): Payer: Self-pay | Admitting: Emergency Medicine

## 2013-12-21 ENCOUNTER — Emergency Department (HOSPITAL_COMMUNITY): Payer: Medicare Other

## 2013-12-21 ENCOUNTER — Inpatient Hospital Stay (HOSPITAL_COMMUNITY)
Admission: EM | Admit: 2013-12-21 | Discharge: 2013-12-27 | DRG: 208 | Disposition: A | Discharge status: Skilled Nursing Facility | Payer: Medicare Other | Attending: Internal Medicine | Admitting: Internal Medicine

## 2013-12-21 ENCOUNTER — Telehealth: Payer: Self-pay | Admitting: Cardiology

## 2013-12-21 DIAGNOSIS — R4701 Aphasia: Secondary | ICD-10-CM

## 2013-12-21 DIAGNOSIS — R740 Nonspecific elevation of levels of transaminase and lactic acid dehydrogenase [LDH]: Secondary | ICD-10-CM | POA: Diagnosis present

## 2013-12-21 DIAGNOSIS — N183 Chronic kidney disease, stage 3 (moderate): Secondary | ICD-10-CM | POA: Diagnosis present

## 2013-12-21 DIAGNOSIS — J9601 Acute respiratory failure with hypoxia: Secondary | ICD-10-CM | POA: Diagnosis present

## 2013-12-21 DIAGNOSIS — R1310 Dysphagia, unspecified: Secondary | ICD-10-CM | POA: Diagnosis not present

## 2013-12-21 DIAGNOSIS — D649 Anemia, unspecified: Secondary | ICD-10-CM | POA: Diagnosis present

## 2013-12-21 DIAGNOSIS — G459 Transient cerebral ischemic attack, unspecified: Secondary | ICD-10-CM | POA: Diagnosis present

## 2013-12-21 DIAGNOSIS — E7800 Pure hypercholesterolemia, unspecified: Secondary | ICD-10-CM

## 2013-12-21 DIAGNOSIS — Z87440 Personal history of urinary (tract) infections: Secondary | ICD-10-CM

## 2013-12-21 DIAGNOSIS — J9600 Acute respiratory failure, unspecified whether with hypoxia or hypercapnia: Secondary | ICD-10-CM

## 2013-12-21 DIAGNOSIS — E039 Hypothyroidism, unspecified: Secondary | ICD-10-CM | POA: Diagnosis present

## 2013-12-21 DIAGNOSIS — R748 Abnormal levels of other serum enzymes: Secondary | ICD-10-CM

## 2013-12-21 DIAGNOSIS — J9602 Acute respiratory failure with hypercapnia: Principal | ICD-10-CM | POA: Diagnosis present

## 2013-12-21 DIAGNOSIS — J690 Pneumonitis due to inhalation of food and vomit: Secondary | ICD-10-CM | POA: Diagnosis present

## 2013-12-21 DIAGNOSIS — M3500 Sicca syndrome, unspecified: Secondary | ICD-10-CM | POA: Diagnosis present

## 2013-12-21 DIAGNOSIS — E871 Hypo-osmolality and hyponatremia: Secondary | ICD-10-CM | POA: Diagnosis present

## 2013-12-21 DIAGNOSIS — I159 Secondary hypertension, unspecified: Secondary | ICD-10-CM

## 2013-12-21 DIAGNOSIS — I119 Hypertensive heart disease without heart failure: Secondary | ICD-10-CM | POA: Diagnosis present

## 2013-12-21 DIAGNOSIS — J810 Acute pulmonary edema: Secondary | ICD-10-CM | POA: Diagnosis present

## 2013-12-21 DIAGNOSIS — I1310 Hypertensive heart and chronic kidney disease without heart failure, with stage 1 through stage 4 chronic kidney disease, or unspecified chronic kidney disease: Secondary | ICD-10-CM | POA: Diagnosis present

## 2013-12-21 DIAGNOSIS — R7401 Elevation of levels of liver transaminase levels: Secondary | ICD-10-CM | POA: Diagnosis present

## 2013-12-21 DIAGNOSIS — Q871 Congenital malformation syndromes predominantly associated with short stature: Secondary | ICD-10-CM

## 2013-12-21 DIAGNOSIS — Z7902 Long term (current) use of antithrombotics/antiplatelets: Secondary | ICD-10-CM

## 2013-12-21 DIAGNOSIS — Z79899 Other long term (current) drug therapy: Secondary | ICD-10-CM

## 2013-12-21 DIAGNOSIS — R0902 Hypoxemia: Secondary | ICD-10-CM

## 2013-12-21 DIAGNOSIS — J811 Chronic pulmonary edema: Secondary | ICD-10-CM | POA: Diagnosis present

## 2013-12-21 DIAGNOSIS — Z87891 Personal history of nicotine dependence: Secondary | ICD-10-CM

## 2013-12-21 DIAGNOSIS — Z978 Presence of other specified devices: Secondary | ICD-10-CM

## 2013-12-21 DIAGNOSIS — I10 Essential (primary) hypertension: Secondary | ICD-10-CM

## 2013-12-21 DIAGNOSIS — G20 Parkinson's disease: Secondary | ICD-10-CM | POA: Diagnosis present

## 2013-12-21 DIAGNOSIS — D696 Thrombocytopenia, unspecified: Secondary | ICD-10-CM | POA: Diagnosis present

## 2013-12-21 DIAGNOSIS — I959 Hypotension, unspecified: Secondary | ICD-10-CM | POA: Diagnosis present

## 2013-12-21 DIAGNOSIS — R0689 Other abnormalities of breathing: Secondary | ICD-10-CM

## 2013-12-21 DIAGNOSIS — K760 Fatty (change of) liver, not elsewhere classified: Secondary | ICD-10-CM | POA: Diagnosis present

## 2013-12-21 DIAGNOSIS — E872 Acidosis: Secondary | ICD-10-CM | POA: Diagnosis present

## 2013-12-21 DIAGNOSIS — Z7982 Long term (current) use of aspirin: Secondary | ICD-10-CM

## 2013-12-21 DIAGNOSIS — I5033 Acute on chronic diastolic (congestive) heart failure: Secondary | ICD-10-CM | POA: Diagnosis present

## 2013-12-21 HISTORY — DX: Prediabetes: R73.03

## 2013-12-21 LAB — COMPREHENSIVE METABOLIC PANEL
ALBUMIN: 3.6 g/dL (ref 3.5–5.2)
ALK PHOS: 146 U/L — AB (ref 39–117)
ALK PHOS: 135 U/L — AB (ref 39–117)
ALT: 61 U/L — ABNORMAL HIGH (ref 0–35)
ALT: 55 U/L — ABNORMAL HIGH (ref 0–35)
AST: 60 U/L — ABNORMAL HIGH (ref 0–37)
AST: 56 U/L — AB (ref 0–37)
Albumin: 2.9 g/dL — ABNORMAL LOW (ref 3.5–5.2)
Anion gap: 11 (ref 5–15)
Anion gap: 10 (ref 5–15)
BILIRUBIN TOTAL: 0.4 mg/dL (ref 0.3–1.2)
BILIRUBIN TOTAL: 0.3 mg/dL (ref 0.3–1.2)
BUN: 22 mg/dL (ref 6–23)
BUN: 21 mg/dL (ref 6–23)
CHLORIDE: 96 meq/L (ref 96–112)
CHLORIDE: 100 meq/L (ref 96–112)
CO2: 26 mEq/L (ref 19–32)
CO2: 25 mEq/L (ref 19–32)
CREATININE: 0.68 mg/dL (ref 0.50–1.10)
Calcium: 9.5 mg/dL (ref 8.4–10.5)
Calcium: 8.4 mg/dL (ref 8.4–10.5)
Creatinine, Ser: 0.76 mg/dL (ref 0.50–1.10)
GFR calc Af Amer: 90 mL/min — ABNORMAL LOW (ref >90)
GFR calc non Af Amer: 75 mL/min — ABNORMAL LOW (ref >90)
GFR calc non Af Amer: 78 mL/min — ABNORMAL LOW (ref >90)
GFR, EST AFRICAN AMERICAN: 87 mL/min — AB (ref >90)
GLUCOSE: 102 mg/dL — AB (ref 70–99)
Glucose, Bld: 130 mg/dL — ABNORMAL HIGH (ref 70–99)
POTASSIUM: 4.6 meq/L (ref 3.7–5.3)
POTASSIUM: 4.2 meq/L (ref 3.7–5.3)
Sodium: 133 mEq/L — ABNORMAL LOW (ref 137–147)
Sodium: 135 mEq/L — ABNORMAL LOW (ref 137–147)
Total Protein: 8 g/dL (ref 6.0–8.3)
Total Protein: 6.7 g/dL (ref 6.0–8.3)

## 2013-12-21 LAB — TSH: TSH: 7.04 u[IU]/mL — AB (ref 0.350–4.500)

## 2013-12-21 LAB — CBC WITH DIFFERENTIAL/PLATELET
Basophils Absolute: 0 10*3/uL (ref 0.0–0.1)
Basophils Relative: 0 % (ref 0–1)
Eosinophils Absolute: 0.1 10*3/uL (ref 0.0–0.7)
Eosinophils Relative: 3 % (ref 0–5)
HCT: 33.9 % — ABNORMAL LOW (ref 36.0–46.0)
Hemoglobin: 11.1 g/dL — ABNORMAL LOW (ref 12.0–15.0)
LYMPHS ABS: 1.3 10*3/uL (ref 0.7–4.0)
LYMPHS PCT: 26 % (ref 12–46)
MCH: 31.3 pg (ref 26.0–34.0)
MCHC: 32.7 g/dL (ref 30.0–36.0)
MCV: 95.5 fL (ref 78.0–100.0)
MONOS PCT: 16 % — AB (ref 3–12)
Monocytes Absolute: 0.8 10*3/uL (ref 0.1–1.0)
NEUTROS ABS: 2.6 10*3/uL (ref 1.7–7.7)
NEUTROS PCT: 55 % (ref 43–77)
PLATELETS: 85 10*3/uL — AB (ref 150–400)
RBC: 3.55 MIL/uL — AB (ref 3.87–5.11)
RDW: 14.6 % (ref 11.5–15.5)
WBC: 4.8 10*3/uL (ref 4.0–10.5)

## 2013-12-21 LAB — URINALYSIS, ROUTINE W REFLEX MICROSCOPIC
Bilirubin Urine: NEGATIVE
GLUCOSE, UA: NEGATIVE mg/dL
Ketones, ur: NEGATIVE mg/dL
Leukocytes, UA: NEGATIVE
Nitrite: NEGATIVE
PH: 6.5 (ref 5.0–8.0)
Protein, ur: 100 mg/dL — AB
Specific Gravity, Urine: 1.016 (ref 1.005–1.030)
Urobilinogen, UA: 1 mg/dL (ref 0.0–1.0)

## 2013-12-21 LAB — IRON AND TIBC
IRON: 68 ug/dL (ref 42–135)
SATURATION RATIOS: 31 % (ref 20–55)
TIBC: 219 ug/dL — AB (ref 250–470)
UIBC: 151 ug/dL (ref 125–400)

## 2013-12-21 LAB — PROTIME-INR
INR: 1.14 (ref 0.00–1.49)
INR: 1.19 (ref 0.00–1.49)
PROTHROMBIN TIME: 14.7 s (ref 11.6–15.2)
PROTHROMBIN TIME: 15.3 s — AB (ref 11.6–15.2)

## 2013-12-21 LAB — CBC
HCT: 32.4 % — ABNORMAL LOW (ref 36.0–46.0)
HEMATOCRIT: 37.5 % (ref 36.0–46.0)
Hemoglobin: 12 g/dL (ref 12.0–15.0)
Hemoglobin: 10.5 g/dL — ABNORMAL LOW (ref 12.0–15.0)
MCH: 30.7 pg (ref 26.0–34.0)
MCH: 31.7 pg (ref 26.0–34.0)
MCHC: 32 g/dL (ref 30.0–36.0)
MCHC: 32.4 g/dL (ref 30.0–36.0)
MCV: 95.9 fL (ref 78.0–100.0)
MCV: 97.9 fL (ref 78.0–100.0)
PLATELETS: 94 10*3/uL — AB (ref 150–400)
Platelets: 144 10*3/uL — ABNORMAL LOW (ref 150–400)
RBC: 3.91 MIL/uL (ref 3.87–5.11)
RBC: 3.31 MIL/uL — ABNORMAL LOW (ref 3.87–5.11)
RDW: 14.7 % (ref 11.5–15.5)
RDW: 14.7 % (ref 11.5–15.5)
WBC: 10.4 10*3/uL (ref 4.0–10.5)
WBC: 7.7 10*3/uL (ref 4.0–10.5)

## 2013-12-21 LAB — URINE MICROSCOPIC-ADD ON

## 2013-12-21 LAB — RETICULOCYTES
RBC.: 3.91 MIL/uL (ref 3.87–5.11)
Retic Count, Absolute: 82.1 10*3/uL (ref 19.0–186.0)
Retic Ct Pct: 2.1 % (ref 0.4–3.1)

## 2013-12-21 LAB — TECHNOLOGIST SMEAR REVIEW

## 2013-12-21 LAB — I-STAT TROPONIN, ED: Troponin i, poc: 0.01 ng/mL (ref 0.00–0.08)

## 2013-12-21 LAB — LACTATE DEHYDROGENASE: LDH: 310 U/L — AB (ref 94–250)

## 2013-12-21 MED ORDER — ENSURE COMPLETE PO LIQD: 237.0000 mL | Freq: Two times a day (BID) | ORAL | Status: DC

## 2013-12-21 MED ORDER — ROSUVASTATIN CALCIUM 10 MG PO TABS
10.0000 mg | ORAL_TABLET | Freq: Every day | ORAL | Status: DC
  Administered 2013-12-23 – 2013-12-26 (×4): 10 mg via ORAL
  Filled 2013-12-21 (×7): qty 1

## 2013-12-21 MED ORDER — ROCURONIUM BROMIDE 50 MG/5ML IV SOLN: 30.0000 mg | Freq: Once | INTRAVENOUS | Status: DC

## 2013-12-21 MED ORDER — MIDAZOLAM HCL 2 MG/2ML IJ SOLN
INTRAMUSCULAR | Status: AC
  Administered 2013-12-21: 4 mg via INTRAVENOUS
  Filled 2013-12-21: qty 2

## 2013-12-21 MED ORDER — PROPOFOL 10 MG/ML IV BOLUS
50.0000 mg | Freq: Once | INTRAVENOUS | Status: AC
  Administered 2013-12-21: 50 mg via INTRAVENOUS

## 2013-12-21 MED ORDER — FENTANYL CITRATE 0.05 MG/ML IJ SOLN
INTRAMUSCULAR | Status: AC
  Filled 2013-12-21: qty 2

## 2013-12-21 MED ORDER — FOLIC ACID 1 MG PO TABS
1.0000 mg | ORAL_TABLET | Freq: Every day | ORAL | Status: DC
  Administered 2013-12-22 – 2013-12-27 (×6): 1 mg via ORAL
  Filled 2013-12-21 (×6): qty 1

## 2013-12-21 MED ORDER — DOCUSATE SODIUM 50 MG/5ML PO LIQD: 100.0000 mg | Freq: Two times a day (BID) | ORAL | Status: DC | PRN

## 2013-12-21 MED ORDER — SODIUM CHLORIDE 0.9 % IV BOLUS (SEPSIS)
500.0000 mL | Freq: Once | INTRAVENOUS | Status: AC
  Administered 2013-12-21 (×2): 500 mL via INTRAVENOUS

## 2013-12-21 MED ORDER — MIDAZOLAM HCL 2 MG/2ML IJ SOLN
4.0000 mg | Freq: Once | INTRAMUSCULAR | Status: AC
Start: 2013-12-21 — End: 2013-12-21
  Administered 2013-12-21: 4 mg via INTRAVENOUS

## 2013-12-21 MED ORDER — FENTANYL CITRATE 0.05 MG/ML IJ SOLN
200.0000 ug | Freq: Once | INTRAMUSCULAR | Status: AC
Start: 2013-12-21 — End: 2013-12-21
  Administered 2013-12-21: 200 ug via INTRAVENOUS

## 2013-12-21 MED ORDER — PROPOFOL 10 MG/ML IV EMUL
INTRAVENOUS | Status: AC
  Administered 2013-12-21: 50 mg via INTRAVENOUS
  Filled 2013-12-21: qty 50

## 2013-12-21 MED ORDER — ACETAMINOPHEN 325 MG PO TABS: 650.0000 mg | ORAL_TABLET | Freq: Four times a day (QID) | ORAL | Status: DC | PRN

## 2013-12-21 MED ORDER — FENTANYL CITRATE 0.05 MG/ML IJ SOLN
50.0000 ug | INTRAMUSCULAR | Status: DC | PRN
  Administered 2013-12-21: 50 ug via INTRAVENOUS
  Filled 2013-12-21 (×3): qty 2

## 2013-12-21 MED ORDER — ENSURE PLUS PO LIQD: 237.0000 mL | Freq: Two times a day (BID) | ORAL | Status: DC

## 2013-12-21 MED ORDER — ROTIGOTINE 8 MG/24HR TD PT24
8.0000 mg/d | MEDICATED_PATCH | Freq: Every day | TRANSDERMAL | Status: DC
  Administered 2013-12-22 – 2013-12-24 (×3): 8 mg/d via TRANSDERMAL
  Filled 2013-12-21 (×6): qty 1

## 2013-12-21 MED ORDER — SODIUM CHLORIDE 0.9 % IV BOLUS (SEPSIS)
1000.0000 mL | Freq: Once | INTRAVENOUS | Status: AC
  Administered 2013-12-21: 1000 mL via INTRAVENOUS

## 2013-12-21 MED ORDER — FUROSEMIDE 10 MG/ML IJ SOLN
40.0000 mg | Freq: Once | INTRAMUSCULAR | Status: AC
  Administered 2013-12-21: 40 mg via INTRAVENOUS
  Filled 2013-12-21: qty 4

## 2013-12-21 MED ORDER — CHLORHEXIDINE GLUCONATE 0.12 % MT SOLN
OROMUCOSAL | Status: AC
  Administered 2013-12-21: 15 mL
  Filled 2013-12-21: qty 15

## 2013-12-21 MED ORDER — SODIUM CHLORIDE 0.9 % IV SOLN
1.0000 g | Freq: Two times a day (BID) | INTRAVENOUS | Status: DC
  Administered 2013-12-22 – 2013-12-23 (×4): 1 g via INTRAVENOUS
  Filled 2013-12-21 (×5): qty 1

## 2013-12-21 MED ORDER — ROTIGOTINE 8 MG/24HR TD PT24: MEDICATED_PATCH | TRANSDERMAL | Status: DC

## 2013-12-21 MED ORDER — SODIUM CHLORIDE 0.9 % IJ SOLN
3.0000 mL | Freq: Two times a day (BID) | INTRAMUSCULAR | Status: DC
  Administered 2013-12-22 – 2013-12-23 (×3): 3 mL via INTRAVENOUS
  Administered 2013-12-23: 10 mL via INTRAVENOUS
  Administered 2013-12-23 – 2013-12-26 (×6): 3 mL via INTRAVENOUS

## 2013-12-21 MED ORDER — NOREPINEPHRINE BITARTRATE 1 MG/ML IV SOLN
0.0000 ug/min | INTRAVENOUS | Status: DC
  Filled 2013-12-21 (×2): qty 4

## 2013-12-21 MED ORDER — ONDANSETRON HCL 4 MG/2ML IJ SOLN: 4.0000 mg | Freq: Three times a day (TID) | INTRAMUSCULAR | Status: DC | PRN

## 2013-12-21 MED ORDER — INSULIN ASPART 100 UNIT/ML ~~LOC~~ SOLN: 1.0000 [IU] | SUBCUTANEOUS | Status: DC

## 2013-12-21 MED ORDER — ASPIRIN 81 MG PO TABS: 81.0000 mg | ORAL_TABLET | Freq: Every day | ORAL | Status: DC

## 2013-12-21 MED ORDER — LEVOTHYROXINE SODIUM 100 MCG PO TABS
100.0000 ug | ORAL_TABLET | Freq: Every day | ORAL | Status: DC
  Administered 2013-12-22 – 2013-12-27 (×5): 100 ug via ORAL
  Filled 2013-12-21 (×8): qty 1

## 2013-12-21 MED ORDER — CLOPIDOGREL BISULFATE 75 MG PO TABS
75.0000 mg | ORAL_TABLET | Freq: Every day | ORAL | Status: DC
  Administered 2013-12-22 – 2013-12-27 (×6): 75 mg via ORAL
  Filled 2013-12-21 (×6): qty 1

## 2013-12-21 MED ORDER — DOCUSATE SODIUM 100 MG PO CAPS
100.0000 mg | ORAL_CAPSULE | Freq: Every day | ORAL | Status: DC
  Administered 2013-12-23 – 2013-12-27 (×4): 100 mg via ORAL
  Filled 2013-12-21 (×4): qty 1

## 2013-12-21 MED ORDER — FENTANYL CITRATE 0.05 MG/ML IJ SOLN
INTRAMUSCULAR | Status: AC
  Administered 2013-12-21: 200 ug via INTRAVENOUS
  Filled 2013-12-21: qty 2

## 2013-12-21 MED ORDER — ACETAMINOPHEN 650 MG RE SUPP
650.0000 mg | Freq: Four times a day (QID) | RECTAL | Status: DC | PRN
Start: 2013-12-21 — End: 2013-12-21

## 2013-12-21 MED ORDER — RASAGILINE MESYLATE 1 MG PO TABS
1.0000 mg | ORAL_TABLET | Freq: Every day | ORAL | Status: DC
  Administered 2013-12-22 – 2013-12-27 (×6): 1 mg via ORAL
  Filled 2013-12-21 (×7): qty 1

## 2013-12-21 MED ORDER — MIDAZOLAM HCL 2 MG/2ML IJ SOLN
INTRAMUSCULAR | Status: AC
  Filled 2013-12-21: qty 2

## 2013-12-21 MED ORDER — ALBUTEROL SULFATE (2.5 MG/3ML) 0.083% IN NEBU: 2.5000 mg | INHALATION_SOLUTION | RESPIRATORY_TRACT | Status: DC | PRN

## 2013-12-21 MED ORDER — ASPIRIN EC 81 MG PO TBEC
81.0000 mg | DELAYED_RELEASE_TABLET | Freq: Every day | ORAL | Status: DC
  Administered 2013-12-22 – 2013-12-27 (×6): 81 mg via ORAL
  Filled 2013-12-21 (×6): qty 1

## 2013-12-21 MED ORDER — FENTANYL CITRATE 0.05 MG/ML IJ SOLN
50.0000 ug | INTRAMUSCULAR | Status: DC | PRN
  Administered 2013-12-21 – 2013-12-22 (×5): 50 ug via INTRAVENOUS
  Filled 2013-12-21 (×3): qty 2

## 2013-12-21 NOTE — Progress Notes (Signed)
ANTIBIOTIC CONSULT NOTE - INITIAL  Pharmacy Consult for Meropenem  Indication: pneumonia  Allergies  Allergen Reactions  . Aciphex [Rabeprazole Sodium]   . Amantadines   . Avapro [Irbesartan]   . Carbidopa-Levodopa   . Cardura [Doxazosin Mesylate]   . Ciprofloxacin   . Clonidine Derivatives   . Cozaar   . Diltiazem   . Famotidine   . Hctz [Hydrochlorothiazide]   . Indapamide   . Lansoprazole   . Lisinopril     Mouth problems  . Loratadine   . Maxidex [Dexamethasone]   . Mirapex [Pramipexole Dihydrochloride]   . Norvasc [Amlodipine Besylate]   . Pantoprazole Sodium   . Penicillins   . Prilosec [Omeprazole]   . Procardia [Nifedipine]   . Proton Pump Inhibitors   . Sulfa Drugs Cross Reactors   . Trimethoprim   . Zyrtec [Cetirizine Hcl]     Patient Measurements:   Total Body Weight: 53kg   Vital Signs: Temp: 98.7 F (37.1 C) (10/16 1347) Temp Source: Oral (10/16 1347) BP: 88/37 mmHg (10/16 2115) Pulse Rate: 91 (10/16 2115) Intake/Output from previous day:   Intake/Output from this shift:    Labs:  Recent Labs  12/21/13 1351 12/21/13 1514 12/21/13 1940  WBC  --  4.8 10.4  HGB  --  11.1* 12.0  PLT  --  85* 144*  CREATININE 0.76  --   --    The CrCl is unknown because both a height and weight (above a minimum accepted value) are required for this calculation. No results found for this basename: VANCOTROUGH, VANCOPEAK, VANCORANDOM, GENTTROUGH, GENTPEAK, GENTRANDOM, TOBRATROUGH, TOBRAPEAK, TOBRARND, AMIKACINPEAK, AMIKACINTROU, AMIKACIN,  in the last 72 hours   Microbiology: No results found for this or any previous visit (from the past 720 hour(s)).  Medical History: Past Medical History  Diagnosis Date  . Hypertension   . Parkinson's disease   . Hyperlipidemia   . Dizziness   . Orthostatic hypotension   . Anemia   . Tremor   . Breast cancer     LEFT BREAST  . Kidney failure     stage 3  . Hypothyroidism      Assessment: 87yof admitted  with TIA symptoms but developed acute pulmonary edema, possible aspiration and is now intubated.  Afebrile, WBC wnl, Cr 1, CrCl 52ml.min,  start broad spectrum ABX - pt has a lot of medication intolerances/ allergies.      Plan:  Meropenem 1gm IV q12  Bonnita Nasuti Pharm.D. CPP, BCPS Clinical Pharmacist [phone] 12/21/2013 9:27 PM

## 2013-12-21 NOTE — Consult Note (Signed)
Referring Physician: Eppie Gibson    Chief Complaint: Transient slurred speech and right arm tingling  HPI:                                                                                                                                         Victoria Lindsey is an 80 y.o. female who is currently on ASA and Plavix.  Patient woke up yesterday around 0700 hours and noted she was unable to express herself and had right felt funny for about one hour.  Her family took her BP and noted it was elevated (systolically in the 622'W) all day (but also notes she has been struggling with her BP for about one year). This AM she awoke and family noted she was having SOB.  Due to multiple medical issues she was brought to ED.  Daughter notes she did give her a extra 1/2 Spironolactone due to elevated BP. Currently her BP remains elevated in 979'G systolic and patient is asymptomatic.    Date last known well: Date: 12/20/2013 Time last known well: Time: 07:00 tPA Given: No: resolved symptoms  Past Medical History  Diagnosis Date  . Hypertension   . Parkinson's disease   . Hyperlipidemia   . Dizziness   . Orthostatic hypotension   . Anemia   . Tremor   . Breast cancer     LEFT BREAST  . Kidney failure     stage 3  . Hypothyroidism     Past Surgical History  Procedure Laterality Date  . Cardiovascular stress test  06/2007    NORMAL  . Breast lumpectomy    . Cholecystectomy    . Tonsillectomy    . Nasal sinus surgery      submucous resection late 1950s    Family History  Problem Relation Age of Onset  . Heart disease Mother   . Heart failure Father   . Parkinsonism Father    Social History:  reports that she quit smoking about 49 years ago. Her smoking use included Cigarettes. She has a 6 pack-year smoking history. She does not have any smokeless tobacco history on file. She reports that she does not drink alcohol or use illicit drugs.  Allergies:  Allergies  Allergen Reactions  . Aciphex  [Rabeprazole Sodium]   . Amantadines   . Avapro [Irbesartan]   . Carbidopa-Levodopa   . Cardura [Doxazosin Mesylate]   . Ciprofloxacin   . Clonidine Derivatives   . Cozaar   . Diltiazem   . Famotidine   . Hctz [Hydrochlorothiazide]   . Indapamide   . Lansoprazole   . Lisinopril     Mouth problems  . Loratadine   . Maxidex [Dexamethasone]   . Mirapex [Pramipexole Dihydrochloride]   . Norvasc [Amlodipine Besylate]   . Pantoprazole Sodium   . Penicillins   . Prilosec [Omeprazole]   . Procardia [Nifedipine]   . Proton  Pump Inhibitors   . Sulfa Drugs Cross Reactors   . Trimethoprim   . Zyrtec [Cetirizine Hcl]     Medications:                                                                                                                           No current facility-administered medications for this encounter.   Current Outpatient Prescriptions  Medication Sig Dispense Refill  . aspirin 81 MG tablet Take 81 mg by mouth daily.        Marland Kitchen BIOTIN PO Take 1,000 mcg by mouth.      . Blood Glucose Monitoring Suppl (ONE TOUCH ULTRA SYSTEM KIT) W/DEVICE KIT 1 kit by Does not apply route once.      . Calcium-Magnesium-Vitamin D 591-63-846 MG-MG-UNIT CHEW Chew by mouth.      . cephALEXin (KEFLEX) 250 MG capsule Take 250 mg by mouth. Take 1 at bedtime.      . Cholecalciferol (VITAMIN D3) 2000 UNITS TABS Take by mouth.      . clopidogrel (PLAVIX) 75 MG tablet TAKE 1 TABLET BY MOUTH EVERY DAY  30 tablet  2  . Cranberry 500 MG CAPS Take by mouth.      . docusate sodium (COLACE) 100 MG capsule Take 100 mg by mouth daily.      Marland Kitchen ENSURE PLUS (ENSURE PLUS) LIQD Take 237 mLs by mouth.      . FOLIC ACID PO Take 0.6 mg by mouth.      Marland Kitchen glucose blood test strip 1 each by Other route as needed for other. Use as instructed      . levothyroxine (SYNTHROID, LEVOTHROID) 100 MCG tablet Take 100 mcg by mouth daily.      . metoprolol (LOPRESSOR) 50 MG tablet Take 1.5 tablets (75 mg total) by mouth 2 (two)  times daily.  90 tablet  11  . Probiotic Product (ALIGN PO) Take by mouth.      . rasagiline (AZILECT) 1 MG TABS Take 1 mg by mouth daily.      . rosuvastatin (CRESTOR) 10 MG tablet       . Rotigotine 8 MG/24HR PT24 Place onto the skin.      Marland Kitchen spironolactone (ALDACTONE) 25 MG tablet Take 0.5 tablets (12.5 mg total) by mouth daily.  45 tablet  3     ROS:  History obtained from the patient  General ROS: negative for - chills, fatigue, fever, night sweats, weight gain or weight loss Psychological ROS: negative for - behavioral disorder, hallucinations, memory difficulties, mood swings or suicidal ideation Ophthalmic ROS: negative for - blurry vision, double vision, eye pain or loss of vision ENT ROS: negative for - epistaxis, nasal discharge, oral lesions, sore throat, tinnitus or vertigo Allergy and Immunology ROS: negative for - hives or itchy/watery eyes Hematological and Lymphatic ROS: negative for - bleeding problems, bruising or swollen lymph nodes Endocrine ROS: negative for - galactorrhea, hair pattern changes, polydipsia/polyuria or temperature intolerance Respiratory ROS: negative for - cough, hemoptysis, shortness of breath or wheezing Cardiovascular ROS: negative for - chest pain, dyspnea on exertion, edema or irregular heartbeat Gastrointestinal ROS: negative for - abdominal pain, diarrhea, hematemesis, nausea/vomiting or stool incontinence Genito-Urinary ROS: negative for - dysuria, hematuria, incontinence or urinary frequency/urgency Musculoskeletal ROS: negative for - joint swelling or muscular weakness Neurological ROS: as noted in HPI Dermatological ROS: negative for rash and skin lesion changes  Neurologic Examination:                                                                                                      Blood pressure 189/91,  pulse 80, temperature 98.7 F (37.1 C), temperature source Oral, resp. rate 16, SpO2 97.00%.  General: NAD Mental Status: Alert, oriented, thought content appropriate.  Speech fluent without evidence of aphasia.  Able to follow 3 step commands without difficulty. Cranial Nerves: II: Discs flat bilaterally; Visual fields grossly normal, pupils equal, round, reactive to light and accommodation III,IV, VI: ptosis not present, extra-ocular motions intact bilaterally V,VII: smile symmetric, facial light touch sensation normal bilaterally VIII: hearing normal bilaterally IX,X: gag reflex present XI: bilateral shoulder shrug XII: midline tongue extension without atrophy or fasciculations  Motor: Right : Upper extremity   5/5    Left:     Upper extremity   5/5  Lower extremity   5/5     Lower extremity   5/5 --bradykinesia but no rigity or cogwheeling Tone and bulk:normal tone throughout; no atrophy noted Sensory: Pinprick and light touch intact throughout, bilaterally Deep Tendon Reflexes:  Right: Upper Extremity   Left: Upper extremity   biceps (C-5 to C-6) 2/4   biceps (C-5 to C-6) 2/4 tricep (C7) 2/4    triceps (C7) 2/4 Brachioradialis (C6) 2/4  Brachioradialis (C6) 2/4  Lower Extremity Lower Extremity  quadriceps (L-2 to L-4) 2/4   quadriceps (L-2 to L-4) 2/4 Achilles (S1) 2/4   Achilles (S1) 2/4  Plantars: Right: downgoing   Left: downgoing Cerebellar: normal finger-to-nose,  normal heel-to-shin test Gait: not tested due to multiple leads CV: pulses palpable throughout    Lab Results: Basic Metabolic Panel:  Recent Labs Lab 12/21/13 1351  NA 133*  K 4.6  CL 96  CO2 26  GLUCOSE 102*  BUN 22  CREATININE 0.76  CALCIUM 9.5    Liver Function Tests:  Recent Labs Lab 12/21/13 1351  AST 60*  ALT 61*  ALKPHOS 146*  BILITOT 0.4  PROT 8.0  ALBUMIN 3.6   No results found for this basename: LIPASE, AMYLASE,  in the last 168 hours No results found for this  basename: AMMONIA,  in the last 168 hours  CBC:  Recent Labs Lab 12/21/13 1514  WBC 4.8  NEUTROABS 2.6  HGB 11.1*  HCT 33.9*  MCV 95.5  PLT 85*    Cardiac Enzymes: No results found for this basename: CKTOTAL, CKMB, CKMBINDEX, TROPONINI,  in the last 168 hours  Lipid Panel: No results found for this basename: CHOL, TRIG, HDL, CHOLHDL, VLDL, LDLCALC,  in the last 168 hours  CBG: No results found for this basename: GLUCAP,  in the last 168 hours  Microbiology: No results found for this or any previous visit.  Coagulation Studies:  Recent Labs  12/21/13 1351  LABPROT 14.7  INR 1.14    Imaging: Dg Chest 2 View  12/21/2013   CLINICAL DATA:  Hypertension.  Headache and dizziness  EXAM: CHEST  2 VIEW  COMPARISON:  11/21/2013  FINDINGS: Cardiac enlargement without heart failure. Elevated left hemidiaphragm and left lower lobe atelectasis/scarring unchanged. Negative for pneumonia. Negative for pleural effusion  Moderate to severe chronic compression fracture approximately L1 unchanged from the prior study.  IMPRESSION: Chronic lung disease is stable.  No superimposed acute abnormality.   Electronically Signed   By: Franchot Gallo M.D.   On: 12/21/2013 14:26   Ct Head Wo Contrast  12/21/2013   CLINICAL DATA:  Parkinson's.  Recent fall.  Fell and hit head today  EXAM: CT HEAD WITHOUT CONTRAST  TECHNIQUE: Contiguous axial images were obtained from the base of the skull through the vertex without intravenous contrast.  COMPARISON:  MRI 04/12/2012  FINDINGS: Generalized atrophy. Mild chronic microvascular ischemic change in the white matter.  Negative for acute infarct.  Negative for hemorrhage or mass.  Negative for skull fracture. Atherosclerotic disease in the cavernous carotid bilaterally. Mucosal thickening left sphenoid sinus.  IMPRESSION: Atrophy and chronic microvascular ischemia.  No acute abnormality   Electronically Signed   By: Franchot Gallo M.D.   On: 12/21/2013 15:48     Echo 10-25-2013 Study Conclusions  - Left ventricle: The cavity size was normal. Wall thickness was increased in a pattern of mild LVH. Systolic function was hyperdynamic. The estimated ejection fraction was in the range of 75% to 80%. Wall motion was normal; there were no regional wall motion abnormalities. There was an increased relative contribution of atrial contraction to ventricular filling. Doppler parameters are consistent with abnormal left ventricular relaxation (grade 1 diastolic dysfunction). - Aortic valve: There was mild regurgitation. - Left atrium: The atrium was mildly dilated.    Assessment and plan discussed with with attending physician and they are in agreement.    Etta Quill PA-C [facility] [phone]  12/21/2013, 4:40 PM   Assessment: 80 y.o. female with TIA symptoms consisting of both expressive aphasia and right arm decreased sensation.  Currently she is asymptomatic. Patient currently is on both ASA and Plavix. Current ABCD2 score is 6 and would benefit from further TIA risk factor evaluation.   Stroke Risk Factors - hyperlipidemia and hypertension  1. HgbA1c, fasting lipid panel 2. MRI, MRA  of the brain without contrast 3. PT consult, OT consult, Speech consult 4. Carotid dopplers 5. Prophylactic therapy-Antiplatelet med: Aspirin - dose 81 mg daily and Antiplatelet med: Plavix - dose 75 mg daily 6. Risk factor modification 7. Telemetry monitoring 8. Frequent neuro checks   Roland Rack, MD [facility]  [phone]  If 7pm- 7am, please page neurology on call as listed in [place].

## 2013-12-21 NOTE — Progress Notes (Signed)
PULMONARY / CRITICAL CARE MEDICINE HISTORY AND PHYSICAL EXAMINATION   Name: Victoria Lindsey MRN: [id number] DOB: 07/27/1925    ADMISSION DATE:  12/21/2013  PRIMARY SERVICE: PCCM  CHIEF COMPLAINT:  SOB  BRIEF PATIENT DESCRIPTION: 80 F with Parkinson's Disease, CKD, HTN, pre-DM who presented to [facility] on 10/16 with likely TIA subsequently developed severe hypoxic respiratory failure almost certainly 2/2 aspiration.   SIGNIFICANT EVENTS / STUDIES:  CT Head 10/16 No acute intracranial process MRI Brain 10/16 No acute intracranial process ABG pre intubation 7.128/77.3/24.5/98.9 Intubation 12/21/2013  LINES / TUBES: PIV X 2 ETT 10/16  CULTURES: Tracheal Aspirate 10/16  ANTIBIOTICS: Meropenem 10/16-  HISTORY OF PRESENT ILLNESS:  Victoria Lindsey is an 80 yo F with Parkinson's Disease, CKD, HTN, pre-DM who presented to [facility] on 10/16 with TIA-like symptoms. She was admitted to the medicine teaching service and evaluation for TIA was initiated. During an MRI she noted a burning sensation in her throat and subsequently developed SOB. On her return to the floor she was noted to be significantly hypoxemic and PCCM was called for evaluation. On my initial arrival she was conversant but quickly became altered.   PAST MEDICAL HISTORY :  Past Medical History  Diagnosis Date  . Hypertension   . Parkinson's disease   . Hyperlipidemia   . Dizziness   . Orthostatic hypotension   . Anemia   . Tremor   . Breast cancer     LEFT BREAST  . Kidney failure     stage 3  . Hypothyroidism    Past Surgical History  Procedure Laterality Date  . Cardiovascular stress test  06/2007    NORMAL  . Breast lumpectomy    . Cholecystectomy    . Tonsillectomy    . Nasal sinus surgery      submucous resection late 1950s   Prior to Admission medications   Medication Sig Start Date End Date Taking? Authorizing Provider  acetaminophen (TYLENOL) 500 MG tablet Take 500 mg by mouth every 6 (six) hours as needed  (pain).   Yes Historical Provider, MD  antiseptic oral rinse (BIOTENE) LIQD 15 mLs by Mouth Rinse route as needed for dry mouth.   Yes Historical Provider, MD  aspirin 81 MG tablet Take 81 mg by mouth every evening.    Yes Historical Provider, MD  BIOTIN PO Take 1,000 mcg by mouth daily.    Yes Historical Provider, MD  cephALEXin (KEFLEX) 250 MG capsule Take 250 mg by mouth at bedtime. Take 1 at bedtime. 11/05/13  Yes Historical Provider, MD  Cholecalciferol (VITAMIN D3) 2000 UNITS TABS Take 1 tablet by mouth daily.    Yes Historical Provider, MD  clopidogrel (PLAVIX) 75 MG tablet Take 75 mg by mouth every evening.   Yes Historical Provider, MD  Cranberry 500 MG CAPS Take 500 mg by mouth daily.    Yes Historical Provider, MD  docusate sodium (COLACE) 100 MG capsule Take 100 mg by mouth daily.   Yes Historical Provider, MD  FOLIC ACID PO Take 0.6 mg by mouth daily.    Yes Historical Provider, MD  hydroxypropyl methylcellulose / hypromellose (ISOPTO TEARS / GONIOVISC) 2.5 % ophthalmic solution Place 1 drop into both eyes 2 (two) times daily.   Yes Historical Provider, MD  levothyroxine (SYNTHROID, LEVOTHROID) 100 MCG tablet Take 100 mcg by mouth daily.   Yes Historical Provider, MD  metoprolol (LOPRESSOR) 50 MG tablet Take 1.5 tablets (75 mg total) by mouth 2 (two) times daily. 10/26/13  Yes  [name], MD  Multiple Vitamin (MULTIVITAMIN WITH MINERALS) TABS tablet Take 1 tablet by mouth daily.   Yes Historical Provider, MD  Probiotic Product (ALIGN PO) Take 1 capsule by mouth every evening.    Yes Historical Provider, MD  rasagiline (AZILECT) 1 MG TABS Take 1 mg by mouth daily.   Yes Historical Provider, MD  rosuvastatin (CRESTOR) 10 MG tablet Take 10 mg by mouth every evening.  05/24/11  Yes [name], MD  Rotigotine 8 MG/24HR PT24 Place 1 patch onto the skin daily.    Yes Historical Provider, MD  spironolactone (ALDACTONE) 25 MG tablet Take 0.5 tablets (12.5 mg total) by mouth daily.  05/28/13  Yes [name], MD   Allergies  Allergen Reactions  . Aciphex [Rabeprazole Sodium]   . Amantadines   . Avapro [Irbesartan]   . Carbidopa-Levodopa   . Cardura [Doxazosin Mesylate]   . Ciprofloxacin   . Clonidine Derivatives   . Cozaar   . Diltiazem   . Famotidine   . Hctz [Hydrochlorothiazide]   . Indapamide   . Lansoprazole   . Lisinopril     Mouth problems  . Loratadine   . Maxidex [Dexamethasone]   . Mirapex [Pramipexole Dihydrochloride]   . Norvasc [Amlodipine Besylate]   . Pantoprazole Sodium   . Penicillins   . Prilosec [Omeprazole]   . Procardia [Nifedipine]   . Proton Pump Inhibitors   . Sulfa Drugs Cross Reactors   . Trimethoprim   . Zyrtec [Cetirizine Hcl]     FAMILY HISTORY:  Family History  Problem Relation Age of Onset  . Heart disease Mother   . Heart failure Father   . Parkinsonism Father    SOCIAL HISTORY:  reports that she quit smoking about 49 years ago. Her smoking use included Cigarettes. She has a 6 pack-year smoking history. She does not have any smokeless tobacco history on file. She reports that she does not drink alcohol or use illicit drugs.  REVIEW OF SYSTEMS:  Unable to obtain secondary to patient condition.  SUBJECTIVE:   VITAL SIGNS: Temp:  [98.7 F (37.1 C)] 98.7 F (37.1 C) (10/16 1347) Pulse Rate:  [71-110] 91 (10/16 2115) Resp:  [15-26] 20 (10/16 2115) BP: (82-243)/(36-125) 88/37 mmHg (10/16 2115) SpO2:  [92 %-100 %] 100 % (10/16 2115) HEMODYNAMICS:   VENTILATOR SETTINGS:   INTAKE / OUTPUT: Intake/Output   None     PHYSICAL EXAMINATION: General:  Elderly F in Moderate Resp Distress on Arrival, Current Intubated  Neuro:  Awake and Alert, Unable to Answer Questions, Per RT no Gag Reflex When Suctioned on return from MRI HEENT:  Sclera anicteric, conjunctiva pink, MMM, ETT present Neck: Trachea supple and midline, (-) LAN or JVD Cardiovascular:  RRR, nS1/S2, (-) MRG Lungs:  Coarse BS  bilaterally Abdomen:  S/NT/ND/(+)BS Musculoskeletal:  (-) C/C/E Skin:  Intact  LABS:  CBC  Recent Labs Lab 12/21/13 1514 12/21/13 1940  WBC 4.8 10.4  HGB 11.1* 12.0  HCT 33.9* 37.5  PLT 85* 144*   Coag's  Recent Labs Lab 12/21/13 1351  INR 1.14   BMET  Recent Labs Lab 12/21/13 1351  NA 133*  K 4.6  CL 96  CO2 26  BUN 22  CREATININE 0.76  GLUCOSE 102*   Electrolytes  Recent Labs Lab 12/21/13 1351  CALCIUM 9.5   Sepsis Markers No results found for this basename: LATICACIDVEN, PROCALCITON, O2SATVEN,  in the last 168 hours ABG  Recent Labs Lab 12/21/13 1923  PHART 7.128*  PCO2ART 77.3*   Liver Enzymes  Recent Labs Lab 12/21/13 1351  AST 60*  ALT 61*  ALKPHOS 146*  BILITOT 0.4  ALBUMIN 3.6   Cardiac Enzymes No results found for this basename: TROPONINI, PROBNP,  in the last 168 hours Glucose No results found for this basename: GLUCAP,  in the last 168 hours  Imaging Dg Chest 2 View  12/21/2013   CLINICAL DATA:  Hypertension.  Headache and dizziness  EXAM: CHEST  2 VIEW  COMPARISON:  11/21/2013  FINDINGS: Cardiac enlargement without heart failure. Elevated left hemidiaphragm and left lower lobe atelectasis/scarring unchanged. Negative for pneumonia. Negative for pleural effusion  Moderate to severe chronic compression fracture approximately L1 unchanged from the prior study.  IMPRESSION: Chronic lung disease is stable.  No superimposed acute abnormality.   Electronically Signed   By: Franchot Gallo M.D.   On: 12/21/2013 14:26   Ct Head Wo Contrast  12/21/2013   CLINICAL DATA:  Parkinson's.  Recent fall.  Fell and hit head today  EXAM: CT HEAD WITHOUT CONTRAST  TECHNIQUE: Contiguous axial images were obtained from the base of the skull through the vertex without intravenous contrast.  COMPARISON:  MRI 04/12/2012  FINDINGS: Generalized atrophy. Mild chronic microvascular ischemic change in the white matter.  Negative for acute infarct.  Negative  for hemorrhage or mass.  Negative for skull fracture. Atherosclerotic disease in the cavernous carotid bilaterally. Mucosal thickening left sphenoid sinus.  IMPRESSION: Atrophy and chronic microvascular ischemia.  No acute abnormality   Electronically Signed   By: Franchot Gallo M.D.   On: 12/21/2013 15:48   Mr Brain Wo Contrast  12/21/2013   CLINICAL DATA:  Episode of speech disturbance and right-sided weakness.  EXAM: MRI HEAD WITHOUT CONTRAST  TECHNIQUE: Multiplanar, multiecho pulse sequences of the brain and surrounding structures were obtained without intravenous contrast.  COMPARISON:  Head CT same day.  MRI 04/12/2012.  FINDINGS: Diffusion imaging does not show any acute or subacute infarction. The brainstem is normal. There are a few old small vessel cerebellar infarctions. There are a few old small vessel infarctions affecting the thalami basal ganglia. There are moderate chronic small-vessel ischemic changes throughout the deep and subcortical white matter. No cortical or large vessel territory infarction. No mass lesion, hemorrhage, hydrocephalus or extra-axial collection. There is arachnoid herniation into the sella but no pituitary mass. No inflammatory sinus disease. No skull or skullbase lesion.  IMPRESSION: No acute or subacute infarction. Moderate chronic small-vessel ischemic changes throughout the brain as outlined above.   Electronically Signed   By: Nelson Chimes M.D.   On: 12/21/2013 18:44   Dg Chest Port 1 View  12/21/2013   CLINICAL DATA:  Acute hypoxia.  EXAM: PORTABLE CHEST - 1 VIEW  COMPARISON:  PA and lateral chest earlier this same day.  FINDINGS: Since the earlier study, extensive bilateral airspace disease has developed. Heart size is enlarged. There is small left pleural effusion. No pneumothorax.  IMPRESSION: New, extensive bilateral airspace disease is likely due to pulmonary edema.   Electronically Signed   By: Inge Rise M.D.   On: 12/21/2013 19:59   CXR:  Pre-Intubation films personally reviewed. Interval development of severe bilateral opacities.   ASSESSMENT / PLAN:  Principal Problem:   TIA (transient ischemic attack) Active Problems:   Benign hypertensive heart disease without heart failure   Parkinson's disease   Hypothyroidism   Hypercholesterolemia   Sjogren's syndrome   Dyspnea   Normocytic anemia   Thrombocytopenia  PULMONARY A: Acute Hypoxic and Hypercarbic Respiratory Failure: Given history and rapidly worsened X-ray this was almost certainly 2/2 aspiration. Other differential considerations include HTNion induced acute heart failure but this is considered much less likely.  P:   Lung Protective Ventilation Serial ABGs VAP prevention SBT/Daily Awakening  CARDIOVASCULAR A: Hypotension: Transient and likely post-intubation. Responded to minimal post-intubation fluids. HTN:  P:   Hold anti-hypertensives  RENAL A: Hyponatremia: Minimal CKD: Cr better than usual baseline P:   Monitor  GASTROINTESTINAL A: Minimally elevated LFTs: Barely above threshold  P:   Monitor  HEMATOLOGIC A: Thrombocytopenia: Better than baseline P:   [place]  INFECTIOUS A: Likely Aspiration:  P:   Mero per Pharmacy (given multiple drug allergies) Obtain tracheal aspirate  ENDOCRINE A: Hypothyroidism:  Impaired Fasting Glucose P:   Continue home regimen SSI  NEUROLOGIC A: TIA:  PD P:   F/u neuro recs  BEST PRACTICE / DISPOSITION Level of Care:  ICU Primary Service:  PCCM Consultants:  Neuro Code Status:  Full, confirmed with family prior to intubation Diet:  NPO, start TFs if not extubated in AM DVT Px:  SQH GI Px:  PPI Skin Integrity:  Intact Social / Family:  Updated at bedside  TODAY'S SUMMARY:   I have personally obtained a history, examined the patient, evaluated laboratory and imaging results, formulated the assessment and plan and placed orders.  CRITICAL CARE: The patient is critically ill  with multiple organ systems failure and requires high complexity decision making for assessment and support, frequent evaluation and titration of therapies, application of advanced monitoring technologies and extensive interpretation of multiple databases. Critical Care Time devoted to patient care services described in this note is 45 minutes.   Margarette Asal, MD Pulmonary and [place] Pager: [phone]   12/21/2013, 9:26 PM

## 2013-12-21 NOTE — Procedures (Signed)
Intubation Procedure Note KINETA FUDALA [id number] 02/17/1926  Procedure: Intubation Indications: Respiratory insufficiency  Procedure Details Consent: Risks of procedure as well as the alternatives and risks of each were explained to the (patient/caregiver).  Consent for procedure obtained. Time Out: Verified patient identification, verified procedure, site/side was marked, verified correct patient position, special equipment/implants available, medications/allergies/relevent history reviewed, required imaging and test results available.  Performed  Maximum sterile technique was used including hand hygiene and mask.   Induced with 50 mg Propofol, 200 mcg fentanyl Paralyzed with 30 mg Rocuronium Glidescope Used Grade 1 view 7.5 ETT placed with 1 attempt, secured at 21 cm  Good breath sounds bilaterally, (+) color change   Evaluation Hemodynamic Status: Persistent hypotension treated with pressors; O2 sats: stable throughout Patient's Current Condition: stable Complications: No apparent complications Patient did tolerate procedure well. Chest X-ray ordered to verify placement.  CXR: pending.   Shimika Ames R. 12/21/2013

## 2013-12-21 NOTE — Telephone Encounter (Signed)
New Message  BP really high today.. Home care says that her BP is 202/90.. Its going up and down within 30 minutes.. Requests a call backl to discuss//sr

## 2013-12-21 NOTE — Progress Notes (Signed)
Report given to [facility] in [facility]. Rapid response team, MD, resident and family at bedside with patient, prepare to transfer patient.   Ave Filter, RN

## 2013-12-21 NOTE — Telephone Encounter (Signed)
Received a call from patients daughter regarding blood pressure. Stated systolic blood pressure 003'K x 2 and the up to 202. Patient having some increased swelling (slightly per daughter) and shortness of breath. Patient has been having shortness of breath and is seeing Pulmonologist. Discussed with Tera Helper NP and ok for patient to take extra 1/2 Spironolactone now. When called daughter back to advise, she stated she may take to the ED secondary to patient just not feeling good. Daughter was unable to explain how patient just did not feel well. Advised daughter if she felt she needed to go to ED to go.

## 2013-12-21 NOTE — ED Provider Notes (Signed)
CSN: [id number]     Arrival date & time 12/21/13  1329 History   First MD Initiated Contact with Patient 12/21/13 1428     Chief Complaint  Patient presents with  . Hypertension  . Headache  . Dizziness     (Consider location/radiation/quality/duration/timing/severity/associated sxs/prior Treatment) HPI Victoria Lindsey is a 80 y.o. female with history of hypertension, TIA, Parkinson's disease, who presents to emergency department complaining of elevated blood pressure, inability to speak or grip with her hand yesterday. Patient states yesterday she had an episode where she was unable to speak or grip with the right hand. She states she can move her fingers but could not pick anything up. Episode lasted approximately an hour. She states "I felt fine otherwise so did not go see a Dr." Today her speech therapist who comes to the house measured her blood pressure and it was over 200 systolic. Patient was sent here for evaluation. Patient receives speech therapy for her Parkinson's disease. At this time patient states she feels weak, denies any headache, no chest pain, no weakness or numbness in extremities, no visual changes, no difficulty speaking.  Past Medical History  Diagnosis Date  . Hypertension   . Parkinson's disease   . Hypothyroidism   . Hyperlipidemia   . Dizziness   . Orthostatic hypotension   . Anemia   . Tremor   . Breast cancer     LEFT BREAST  . Kidney failure     stage 3   Past Surgical History  Procedure Laterality Date  . Cardiovascular stress test  06/2007    NORMAL  . Breast lumpectomy    . Cholecystectomy    . Tonsillectomy    . Nasal sinus surgery      submucous resection late 1950s   Family History  Problem Relation Age of Onset  . Heart disease Mother   . Heart failure Father   . Parkinsonism Father    History  Substance Use Topics  . Smoking status: Former Smoker -- 0.50 packs/day for 12 years    Types: Cigarettes    Quit date: 03/08/1964  .  Smokeless tobacco: Not on file  . Alcohol Use: No   OB History   Grav Para Term Preterm Abortions TAB SAB Ect Mult Living                 Review of Systems  Constitutional: Positive for fatigue. Negative for fever and chills.  Respiratory: Negative for cough, chest tightness and shortness of breath.   Cardiovascular: Negative for chest pain, palpitations and leg swelling.  Gastrointestinal: Negative for nausea, vomiting, abdominal pain and diarrhea.  Genitourinary: Negative for dysuria, flank pain and pelvic pain.  Musculoskeletal: Negative for arthralgias, myalgias, neck pain and neck stiffness.  Skin: Negative for rash.  Neurological: Positive for weakness. Negative for dizziness and headaches.       Positive for difficulty speaking  All other systems reviewed and are negative.     Allergies  Aciphex; Amantadines; Avapro; Carbidopa-levodopa; Cardura; Ciprofloxacin; Clonidine derivatives; Cozaar; Diltiazem; Famotidine; Hctz; Indapamide; Lansoprazole; Lisinopril; Loratadine; Maxidex; Mirapex; Norvasc; Pantoprazole sodium; Penicillins; Prilosec; Procardia; Proton pump inhibitors; Sulfa drugs cross reactors; Trimethoprim; and Zyrtec  Home Medications   Prior to Admission medications   Medication Sig Start Date End Date Taking? Authorizing Provider  aspirin 81 MG tablet Take 81 mg by mouth daily.      Historical Provider, MD  BIOTIN PO Take 1,000 mcg by mouth.    Historical Provider, MD  Blood Glucose Monitoring Suppl (ONE TOUCH ULTRA SYSTEM KIT) W/DEVICE KIT 1 kit by Does not apply route once.    Historical Provider, MD  Calcium-Magnesium-Vitamin D [id number] MG-MG-UNIT CHEW Chew by mouth.    Historical Provider, MD  cephALEXin (KEFLEX) 250 MG capsule Take 250 mg by mouth. Take 1 at bedtime. 11/05/13   Historical Provider, MD  Cholecalciferol (VITAMIN D3) 2000 UNITS TABS Take by mouth.    Historical Provider, MD  clopidogrel (PLAVIX) 75 MG tablet TAKE 1 TABLET BY MOUTH EVERY DAY  12/04/13   [name], MD  Cranberry 500 MG CAPS Take by mouth.    Historical Provider, MD  docusate sodium (COLACE) 100 MG capsule Take 100 mg by mouth daily.    Historical Provider, MD  ENSURE PLUS (ENSURE PLUS) LIQD Take 237 mLs by mouth.    Historical Provider, MD  FOLIC ACID PO Take 0.6 mg by mouth.    Historical Provider, MD  glucose blood test strip 1 each by Other route as needed for other. Use as instructed    Historical Provider, MD  levothyroxine (SYNTHROID, LEVOTHROID) 100 MCG tablet Take 100 mcg by mouth daily.    Historical Provider, MD  metoprolol (LOPRESSOR) 50 MG tablet Take 1.5 tablets (75 mg total) by mouth 2 (two) times daily. 10/26/13   [name], MD  Probiotic Product (ALIGN PO) Take by mouth.    Historical Provider, MD  rasagiline (AZILECT) 1 MG TABS Take 1 mg by mouth daily.    Historical Provider, MD  rosuvastatin (CRESTOR) 10 MG tablet  05/24/11   [name], MD  Rotigotine 8 MG/24HR PT24 Place onto the skin.    Historical Provider, MD  spironolactone (ALDACTONE) 25 MG tablet Take 0.5 tablets (12.5 mg total) by mouth daily. 05/28/13   [name], MD   BP 189/91  Pulse 80  Temp(Src) 98.7 F (37.1 C) (Oral)  Resp 16  SpO2 97% Physical Exam  Nursing note and vitals reviewed. Constitutional: She is oriented to person, place, and time. She appears well-developed and well-nourished. No distress.  HENT:  Head: Normocephalic.  Right Ear: External ear normal.  Left Ear: External ear normal.  Nose: Nose normal.  Mouth/Throat: Oropharynx is clear and moist.  Eyes: Conjunctivae are normal.  Neck: Neck supple.  Cardiovascular: Normal rate, regular rhythm and normal heart sounds.   Pulmonary/Chest: Effort normal and breath sounds normal. No respiratory distress. She has no wheezes. She has no rales.  Abdominal: Soft. Bowel sounds are normal. She exhibits no distension. There is no tenderness. There is no rebound.  Musculoskeletal: She exhibits no  edema.  Neurological: She is alert and oriented to person, place, and time. No cranial nerve deficit. Coordination normal.  5/5 and equal upper and lower extremity strength bilaterally. Equal grip strength bilaterally. Normal finger to nose and heel to shin. Mild right arm drift  Skin: Skin is warm and dry.  Psychiatric: She has a normal mood and affect. Her behavior is normal.    ED Course  Procedures (including critical care time) Labs Review Labs Reviewed  COMPREHENSIVE METABOLIC PANEL - Abnormal; Notable for the following:    Sodium 133 (*)    Glucose, Bld 102 (*)    AST 60 (*)    ALT 61 (*)    Alkaline Phosphatase 146 (*)    GFR calc non Af Amer 75 (*)    GFR calc Af Amer 87 (*)    All other components within normal limits  CBC WITH DIFFERENTIAL -  Abnormal; Notable for the following:    RBC 3.55 (*)    Hemoglobin 11.1 (*)    HCT 33.9 (*)    Platelets 85 (*)    Monocytes Relative 16 (*)    All other components within normal limits  PROTIME-INR  URINALYSIS, ROUTINE W REFLEX MICROSCOPIC  I-STAT TROPOININ, ED    Imaging Review Dg Chest 2 View  12/21/2013   CLINICAL DATA:  Hypertension.  Headache and dizziness  EXAM: CHEST  2 VIEW  COMPARISON:  11/21/2013  FINDINGS: Cardiac enlargement without heart failure. Elevated left hemidiaphragm and left lower lobe atelectasis/scarring unchanged. Negative for pneumonia. Negative for pleural effusion  Moderate to severe chronic compression fracture approximately L1 unchanged from the prior study.  IMPRESSION: Chronic lung disease is stable.  No superimposed acute abnormality.   Electronically Signed   By: Franchot Gallo M.D.   On: 12/21/2013 14:26     EKG Interpretation None      MDM   Final diagnoses:  Transient cerebral ischemia, unspecified transient cerebral ischemia type   Pt is here with elevated BP. She states she has been compliant with her medicaitons, last took this morning. Followed by PCP Dr. Lillette Boxer, and by Dr.  Mare Ferrari with cardiology. PT had an episode yesterday where she had difficulty speaking and loss of strength in right hand which lasted for an hour. Her symptoms have resolved, however there is mild drift in right hand on examination. Concerning for TIA/CVA. Will get labs, CT head.   4:20 PM CT head negative. Labs with no acute findings. Do not want to lower BP acutely in case of CVA. Will need aadmission for TIA work up vs CVA, no recent work up. Spoke with teaching service, will admit. MR ordered.  Also discussed with Dr. Leonel Ramsay, neurology, will consult.   Filed Vitals:   12/21/13 1347 12/21/13 1437  BP: 198/60 189/91  Pulse: 71 80  Temp: 98.7 F (37.1 C)   TempSrc: Oral   Resp: 16   SpO2: 96% 97%     Renold Genta, PA-C 12/21/13 1736

## 2013-12-21 NOTE — Progress Notes (Signed)
12/21/13 1900  Respiratory  Respiratory (WDL) X  Respiratory Pattern Dyspnea at rest;Accessory muscle use  R Upper  Breath Sounds Coarse crackles  L Upper Breath Sounds Coarse crackles  R Lower Breath Sounds Coarse crackles    RT at bedside. Teaching sx,paged and on their way. ,  Ave Filter, RN

## 2013-12-21 NOTE — H&P (Signed)
Internal Medicine Attending Admission Note Date: 12/21/2013  Patient name: Victoria Lindsey Medical record number: [id number] Date of birth: [birth year] Age: 80 y.o. Gender: female  I saw and evaluated the patient. I reviewed the resident's note and I agree with the resident's findings and plan as documented in the resident's note.  Chief Complaint(s): Elevated blood pressure, transient slurred speech and weird feeling in the right hand for one hour yesterday that resolved spontaneously.  History - key components related to admission:  Victoria Lindsey is an 80 year old woman with a history of Parkinson's disease, Sjogren's syndrome, and hypertension who developed the acute onset of slurred speech and a funny feeling in the right hand yesterday. After one hour the symptoms resolved spontaneously and medical attention was not sought. She subsequently was noted to have systolic hypertension in the 200s. The family called her physician who recommended an additional dose of spironolactone and going to the emergency department if they continue to have concerns. She presented to the [facility] emergency department and gave the above story. This raised concerns for a possible TIA. Neurology was consulted and recommended further evaluation. While in the MRI she developed a sensation of acid reflux and subsequently became acutely short of breath. When she arrived back to the floor she was noted to be hypoxemic and using accessory muscles. Repeat chest x-ray revealed an interval development of bilateral pulmonary infiltrates and continued dilation of the stomach. This raised concerns for an aspiration pneumonitis. An arterial blood gas revealed a pH of 7.12 and a PaCO2 of 77. This is consistent with an acute respiratory acidemia. Given the concomitant hypoxemia she was diagnosed with an acute hypoxemic ventilatory failure felt secondary to an acute aspiration related to a lack of a gag reflex. Pulmonary-Critical Care was  consulted and the patient was transferred to the medical intensive care unit for close observation and possible intubation. Discussion with the daughter and husband revealed that a trial of mechanical ventilation to buy her time to get over this acute event was desired.  Physical Exam - key components related to admission:  Filed Vitals:   12/21/13 2115 12/21/13 2130 12/21/13 2145 12/21/13 2200  BP: 88/37 132/59 152/67 148/68  Pulse: 91 94 88 82  Temp:      TempSrc:      Resp: 20 20 20 20   SpO2: 100% 100% 100% 100%    General: Well-developed, well-nourished, elderly woman in mild respiratory distress using accessory muscles of respiration. She was able to answer questions using single words. Lungs: Bibasilar inspiratory rales and end inspiratory squeaks with expiratory wheezes. Heart: Regular rate and rhythm.  Lab results:  Basic Metabolic Panel:  Recent Labs  12/21/13 1351  NA 133*  K 4.6  CL 96  CO2 26  GLUCOSE 102*  BUN 22  CREATININE 0.76  CALCIUM 9.5   Liver Function Tests:  Recent Labs  12/21/13 1351  AST 60*  ALT 61*  ALKPHOS 146*  BILITOT 0.4  PROT 8.0  ALBUMIN 3.6   CBC:  Recent Labs  12/21/13 1514 12/21/13 1940  WBC 4.8 10.4  NEUTROABS 2.6  --   HGB 11.1* 12.0  HCT 33.9* 37.5  MCV 95.5 95.9  PLT 85* 144*   Thyroid Function Tests:  Recent Labs  12/21/13 1940  TSH 7.040*   Anemia Panel:  Recent Labs  12/21/13 1940  RETICCTPCT 2.1   Coagulation:  Recent Labs  12/21/13 1351  INR 1.14   Urinalysis:  Specific gravity 1.016, pH 6.5, protein  100, moderate hemoglobin, white blood cell 3-6 per high-power field, red blood cells 7-10 per high-power field  Imaging results:  Dg Chest 2 View  12/21/2013   CLINICAL DATA:  Hypertension.  Headache and dizziness  EXAM: CHEST  2 VIEW  COMPARISON:  11/21/2013  FINDINGS: Cardiac enlargement without heart failure. Elevated left hemidiaphragm and left lower lobe atelectasis/scarring unchanged.  Negative for pneumonia. Negative for pleural effusion  Moderate to severe chronic compression fracture approximately L1 unchanged from the prior study.  IMPRESSION: Chronic lung disease is stable.  No superimposed acute abnormality.   Electronically Signed   By: Franchot Gallo M.D.   On: 12/21/2013 14:26   Ct Head Wo Contrast  12/21/2013   CLINICAL DATA:  Parkinson's.  Recent fall.  Fell and hit head today  EXAM: CT HEAD WITHOUT CONTRAST  TECHNIQUE: Contiguous axial images were obtained from the base of the skull through the vertex without intravenous contrast.  COMPARISON:  MRI 04/12/2012  FINDINGS: Generalized atrophy. Mild chronic microvascular ischemic change in the white matter.  Negative for acute infarct.  Negative for hemorrhage or mass.  Negative for skull fracture. Atherosclerotic disease in the cavernous carotid bilaterally. Mucosal thickening left sphenoid sinus.  IMPRESSION: Atrophy and chronic microvascular ischemia.  No acute abnormality   Electronically Signed   By: Franchot Gallo M.D.   On: 12/21/2013 15:48   Mr Brain Wo Contrast  12/21/2013   CLINICAL DATA:  Episode of speech disturbance and right-sided weakness.  EXAM: MRI HEAD WITHOUT CONTRAST  TECHNIQUE: Multiplanar, multiecho pulse sequences of the brain and surrounding structures were obtained without intravenous contrast.  COMPARISON:  Head CT same day.  MRI 04/12/2012.  FINDINGS: Diffusion imaging does not show any acute or subacute infarction. The brainstem is normal. There are a few old small vessel cerebellar infarctions. There are a few old small vessel infarctions affecting the thalami basal ganglia. There are moderate chronic small-vessel ischemic changes throughout the deep and subcortical white matter. No cortical or large vessel territory infarction. No mass lesion, hemorrhage, hydrocephalus or extra-axial collection. There is arachnoid herniation into the sella but no pituitary mass. No inflammatory sinus disease. No skull  or skullbase lesion.  IMPRESSION: No acute or subacute infarction. Moderate chronic small-vessel ischemic changes throughout the brain as outlined above.   Electronically Signed   By: Nelson Chimes M.D.   On: 12/21/2013 18:44   Dg Chest Port 1 View  12/21/2013   CLINICAL DATA:  Status post intubation.  EXAM: PORTABLE CHEST - 1 VIEW  COMPARISON:  Single view of the chest 12/21/2013 at 1922 hr.  FINDINGS: The patient has a new endotracheal tube in place with the tip in good position at the level of the clavicular heads. NG tube is also in good position with the tip in the stomach. The chest is better expanded with decreased atelectasis. Patchy bilateral airspace disease likely due to edema again seen. Cardiomegaly.  IMPRESSION: ET tube and NG tube project in good position.  Decreased atelectasis with pulmonary edema again seen.   Electronically Signed   By: Inge Rise M.D.   On: 12/21/2013 21:49   Dg Chest Port 1 View  12/21/2013   CLINICAL DATA:  Acute hypoxia.  EXAM: PORTABLE CHEST - 1 VIEW  COMPARISON:  PA and lateral chest earlier this same day.  FINDINGS: Since the earlier study, extensive bilateral airspace disease has developed. Heart size is enlarged. There is small left pleural effusion. No pneumothorax.  IMPRESSION: New, extensive bilateral airspace  disease is likely due to pulmonary edema.   Electronically Signed   By: Inge Rise M.D.   On: 12/21/2013 19:59   Dg Chest Port 1v Same Day  12/21/2013   CLINICAL DATA:  Status post repositioning of endotracheal tube.  EXAM: PORTABLE CHEST - 1 VIEW SAME DAY  COMPARISON:  Single view of the chest 12/21/2013 at 2133 hr  FINDINGS: ET tube is in place with the tip in good position at the inferior aspect of the clavicular heads. NG tube tip is in the stomach. Patchy bilateral airspace disease and cardiomegaly appear unchanged.  IMPRESSION: ET tube projects in good position.  No change in pulmonary edema.   Electronically Signed   By: Inge Rise M.D.   On: 12/21/2013 22:13   Other results:  EKG: Normal sinus rhythm at 72 beats per minute, normal axis, prolonged QTC, no LVH by voltage, significant Q wave in lead III only, good R wave progression, no ST or T-wave changes except for an isolated T-wave inversion in aVL.  Assessment & Plan by Problem:  Ms. Rickles is an 80 year old woman with a history of Parkinson's disease Sjogren's syndrome, and hypertension who presents 24 hours after a one-hour transient episode of slurred speech and a funny feeling in the right hand. This was concerning for a TIA. MRI was unremarkable although during the study she developed a sensation of reflux and subsequently developed acute dyspnea likely related to an aspiration pneumonitis. This resulted in acute hypoxic ventilatory failure requiring transfer to the medical intensive care unit for close observation and possible intubation since she was a poor candidate for BiPAP therapy given her lack of gag reflex. Acute flash pulmonary edema his much less likely given the recent echocardiogram which was unremarkable for any systolic dysfunction and only notable for grade 1 diastolic dysfunction. Aggressive lowering of the blood pressure was not acutely desirable given the possible TIA with the desire of permissive hypertension and the unlikely chance this represented flash pulmonary edema. We appreciate Pulmonary-Critical Care Medicine's help with the management of this acutely ill woman.

## 2013-12-21 NOTE — Progress Notes (Signed)
Called to Rapid Response on 4N at [facility] [place] . Patient diaphoretic, BBS crackles throughout SPO2 on 100% NRB now 100%, Used oral suction to help clear airway and realized patient has no gag, Rapid response RN at bedside and aware. Patient alert increased WOB ABG obtained and MD at bedside.  ABG    Component Value Date/Time   PHART 7.128* 12/21/2013 1923   PCO2ART 77.3* 12/21/2013 1923   HCO3 24.5* 12/21/2013 1923   TCO2 26.9 12/21/2013 1923   ACIDBASEDEF 3.6* 12/21/2013 1923   O2SAT 98.9 12/21/2013 1923     Transferred pt to 2S on 100% NRB, report given.

## 2013-12-21 NOTE — ED Notes (Signed)
Today her therapist took her bp and it was 202/90 and yesterday she had 1 hour episode of not being able to talk right and her rt arm was feeling funny. Pt was not seen anddid n ot know family called dr and was told to give her extra spiraltone today

## 2013-12-21 NOTE — Progress Notes (Addendum)
Patient arrived  from ED via stretcher, patient /o unable to breath with O2 sat in the high 30s. ,Nonrebreather applied. ,Elevated B/P systolic ,in the 568S. ,RAPID paged. TELE applied and confirmed with CCMD. RAPID Team at bedside.   Ave Filter, ,RN

## 2013-12-21 NOTE — H&P (Signed)
Date: 12/21/2013               Patient Name:  Victoria Lindsey MRN: [id number]  DOB: Mar 03, 1926 Age / Sex: 80 y.o., female   PCP: Elba Barman, MD         Medical Service: Internal Medicine Teaching Service         Attending Physician: Dr. Karren Cobble, MD    First Contact: MS4 Jake Bathe Pager: [phone]  Second Contact: Dr. Joni Reining Pager: [phone]       After Hours (After 5p/  First Contact Pager: [phone]  weekends / holidays): Second Contact Pager: [phone]   Chief Complaint: elevated blood pressure  History of Present Illness: Victoria Lindsey is a 80 yo F with PMH of parkinson's disease, sjogren's syndrome, HTN, hypothyroidism, chronic anemia who presents to [facility] accompanied by her daughter and husband.  Her daughter reports that she has been receiving home health therapy after a fall a few weeks ago and the therapist noted her SBP was >200, and it is usually not that high, this made her concerned because her mother and father had said that [name] had an episode of not being able to find the right words and some slurred speech yesterday that resolved after 1 hour.  She also did note some "weird" feeling during this time of her right hand (that was not weakness or numbness).  Initially they were not concerned as her symptoms resolved quickly but they became concerned after learning about the high blood pressure.She does report one episode 8-10 years ago that was similar likely was after this she was started on plavix.  The daughter is also concerned about her breathing as she has had some increased dyspnea over the past several weeks although this has been an ongoing problem for the past 2 years.  They have seen Dr. Gwenette Greet (pulmonology) but has only heard that she has an elevated left hemidiaphragm.  Of note patient fell in July> compression fracture in L1, broken 2 ribs, fell going to bathroom they attribute this to balance issues from parkinsons Meds: No current  facility-administered medications for this encounter.   Current Outpatient Prescriptions  Medication Sig Dispense Refill  . aspirin 81 MG tablet Take 81 mg by mouth daily.        Marland Kitchen BIOTIN PO Take 1,000 mcg by mouth.      . Blood Glucose Monitoring Suppl (ONE TOUCH ULTRA SYSTEM KIT) W/DEVICE KIT 1 kit by Does not apply route once.      . Calcium-Magnesium-Vitamin D [id number] MG-MG-UNIT CHEW Chew by mouth.      . cephALEXin (KEFLEX) 250 MG capsule Take 250 mg by mouth. Take 1 at bedtime.      . Cholecalciferol (VITAMIN D3) 2000 UNITS TABS Take by mouth.      . clopidogrel (PLAVIX) 75 MG tablet TAKE 1 TABLET BY MOUTH EVERY DAY  30 tablet  2  . Cranberry 500 MG CAPS Take by mouth.      . docusate sodium (COLACE) 100 MG capsule Take 100 mg by mouth daily.      Marland Kitchen ENSURE PLUS (ENSURE PLUS) LIQD Take 237 mLs by mouth.      . FOLIC ACID PO Take 0.6 mg by mouth.      Marland Kitchen glucose blood test strip 1 each by Other route as needed for other. Use as instructed      . levothyroxine (SYNTHROID, LEVOTHROID) 100 MCG tablet Take 100 mcg by mouth  daily.      . metoprolol (LOPRESSOR) 50 MG tablet Take 1.5 tablets (75 mg total) by mouth 2 (two) times daily.  90 tablet  11  . Probiotic Product (ALIGN PO) Take by mouth.      . rasagiline (AZILECT) 1 MG TABS Take 1 mg by mouth daily.      . rosuvastatin (CRESTOR) 10 MG tablet       . Rotigotine 8 MG/24HR PT24 Place onto the skin.      Marland Kitchen spironolactone (ALDACTONE) 25 MG tablet Take 0.5 tablets (12.5 mg total) by mouth daily.  45 tablet  3    Allergies: Allergies as of 12/21/2013 - Review Complete 12/21/2013  Allergen Reaction Noted  . Aciphex [rabeprazole sodium]  12/17/2010  . Amantadines  12/17/2010  . Avapro [irbesartan]  12/17/2010  . Carbidopa-levodopa  12/17/2010  . Cardura [doxazosin mesylate]  12/17/2010  . Ciprofloxacin  12/17/2010  . Clonidine derivatives  12/17/2010  . Cozaar  08/26/2010  . Diltiazem  08/26/2010  . Famotidine  08/26/2010  . Hctz  [hydrochlorothiazide]  12/17/2010  . Indapamide  12/17/2010  . Lansoprazole  12/17/2010  . Lisinopril  08/18/2011  . Loratadine  12/17/2010  . Maxidex [dexamethasone]  12/17/2010  . Mirapex [pramipexole dihydrochloride]  12/17/2010  . Norvasc [amlodipine besylate]  08/26/2010  . Pantoprazole sodium  08/26/2010  . Penicillins  08/26/2010  . Prilosec [omeprazole]  12/17/2010  . Procardia [nifedipine]  12/17/2010  . Proton pump inhibitors  12/17/2010  . Sulfa drugs cross reactors  08/26/2010  . Trimethoprim  12/17/2010  . Zyrtec [cetirizine hcl]  12/17/2010   Past Medical History  Diagnosis Date  . Hypertension   . Parkinson's disease   . Hyperlipidemia   . Dizziness   . Orthostatic hypotension   . Anemia   . Tremor   . Breast cancer     LEFT BREAST  . Kidney failure     stage 3  . Hypothyroidism    Past Surgical History  Procedure Laterality Date  . Cardiovascular stress test  06/2007    NORMAL  . Breast lumpectomy    . Cholecystectomy    . Tonsillectomy    . Nasal sinus surgery      submucous resection late 1950s   Family History  Problem Relation Age of Onset  . Heart disease Mother   . Heart failure Father   . Parkinsonism Father    History   Social History  . Marital Status: Married    Spouse Name: N/A    Number of Children: N/A  . Years of Education: N/A   Occupational History  . retired    Social History Main Topics  . Smoking status: Former Smoker -- 0.50 packs/day for 12 years    Types: Cigarettes    Quit date: 03/08/1964  . Smokeless tobacco: Not on file  . Alcohol Use: No  . Drug Use: No  . Sexual Activity: Not on file   Other Topics Concern  . Not on file   Social History Narrative  . No narrative on file    Review of Systems: Review of Systems  Constitutional: Positive for malaise/fatigue. Negative for fever, chills and weight loss.  Eyes: Negative for blurred vision and double vision.  Respiratory: Positive for shortness of  breath. Negative for cough and sputum production.   Cardiovascular: Negative for chest pain and leg swelling.  Gastrointestinal: Negative for abdominal pain.  Genitourinary: Negative for dysuria.  Skin: Negative for rash.  Neurological: Positive  for sensory change and speech change. Negative for dizziness, focal weakness, seizures, loss of consciousness, weakness and headaches.     Physical Exam: Blood pressure 189/91, pulse 80, temperature 98.7 F (37.1 C), temperature source Oral, resp. rate 16, SpO2 97.00%. Physical Exam  Nursing note and vitals reviewed. Constitutional: She is oriented to person, place, and time and well-developed, well-nourished, and in no distress.  HENT:  Head: Normocephalic and atraumatic.  Eyes: EOM are normal. Pupils are equal, round, and reactive to light.  Cardiovascular: Normal rate, regular rhythm and normal heart sounds.   No murmur heard. Pulmonary/Chest: She has wheezes (minimal end expiratory wheeze over RML, otherwise CTA b/l).  Some mild abdominal breathing  Abdominal: Soft. Bowel sounds are normal. She exhibits no distension. There is no tenderness. There is no rebound.  Musculoskeletal: She exhibits edema (trace bilateral pedal).  Neurological: She is alert and oriented to person, place, and time. She has normal sensation and normal strength. She is not disoriented. She displays tremor. She displays facial symmetry and normal reflexes. No cranial nerve deficit.     Lab results: Basic Metabolic Panel:  Recent Labs  12/21/13 1351  NA 133*  K 4.6  CL 96  CO2 26  GLUCOSE 102*  BUN 22  CREATININE 0.76  CALCIUM 9.5   Liver Function Tests:  Recent Labs  12/21/13 1351  AST 60*  ALT 61*  ALKPHOS 146*  BILITOT 0.4  PROT 8.0  ALBUMIN 3.6   CBC:  Recent Labs  12/21/13 1514  WBC 4.8  NEUTROABS 2.6  HGB 11.1*  HCT 33.9*  MCV 95.5  PLT 85*   Coagulation:  Recent Labs  12/21/13 1351  LABPROT 14.7  INR 1.14   Imaging  results:  Dg Chest 2 View  12/21/2013   CLINICAL DATA:  Hypertension.  Headache and dizziness  EXAM: CHEST  2 VIEW  COMPARISON:  11/21/2013  FINDINGS: Cardiac enlargement without heart failure. Elevated left hemidiaphragm and left lower lobe atelectasis/scarring unchanged. Negative for pneumonia. Negative for pleural effusion  Moderate to severe chronic compression fracture approximately L1 unchanged from the prior study.  IMPRESSION: Chronic lung disease is stable.  No superimposed acute abnormality.   Electronically Signed   By: Franchot Gallo M.D.   On: 12/21/2013 14:26   Ct Head Wo Contrast  12/21/2013   CLINICAL DATA:  Parkinson's.  Recent fall.  Fell and hit head today  EXAM: CT HEAD WITHOUT CONTRAST  TECHNIQUE: Contiguous axial images were obtained from the base of the skull through the vertex without intravenous contrast.  COMPARISON:  MRI 04/12/2012  FINDINGS: Generalized atrophy. Mild chronic microvascular ischemic change in the white matter.  Negative for acute infarct.  Negative for hemorrhage or mass.  Negative for skull fracture. Atherosclerotic disease in the cavernous carotid bilaterally. Mucosal thickening left sphenoid sinus.  IMPRESSION: Atrophy and chronic microvascular ischemia.  No acute abnormality   Electronically Signed   By: Franchot Gallo M.D.   On: 12/21/2013 15:48    Other results: EKG: NSR, no acute ST or t wave changes, prolonged QT  Assessment & Plan by Problem: Acute respiratory failure - After seeing patient in the ED she went for MRI and upon arrival to the floor she was note to be acutely dyspneic with increased WOB.  O2 sat at that time was 40%, rapid response was called and we were notified.  I returned to patient's bedside where she was noted to have accessory muscle use and new bilateral rales.  Patient  was alert and conscience she could report that she felt like she may have had some reflux in the MRI.  She was placed on a NRB and ABG obtained showed an acute  respiratory acidosis.  A STAT CXR showed new bilateral pulmonary edema.  Per respiratory therapy patient had no gag reflex on suctioning. - My initial suspicion is Aspiratoration versus flash pulmonary edema from acute heart failure.   - Ordered 110m of IV lasix. - Consulted PCCM for transfer to ICU and possible intubation.      TIA (transient ischemic attack) - Patient symptoms of expressive aphasia/slurred speech lasting 1 hour with abnormal sensation of right hand is most consistent with a TIA. - Neurology consulted in ED and recommend TIA/ CVA work-up (complete if patient willing to intervene- ie surgery for carotid stenosis) -  Will obtain MRI to rule out CVA - Carotid dopplers (although family unsure if they would proceeded with surgery if abnormal, they still would like to have this done) - A1c, Lipid Panel, TSH, B12 - Neuro checks q4 -Will not repeat echo as just obtained 2 months ago. - Continue ASA and Plavix for antiplatelet therapy. -Failed RN swallow eval (on dysphasia diet at home with nectar thick liquids) - SLP eval>> Diet NPO - PT and OT eval   HTN - Hold home antihypertensive in setting of TIA/ CVA ruleout - Can given IV labetalol for BP >220/110    Parkinson's disease - Continue rasagiline and rotigotine    Hypothyroidism -Continue home synthroid    Hypercholesterolemia - Continue crestor    Sjogren's syndrome -Stable    Normocytic anemia with new Thrombocytopenia - Per EHR her Hgb is at is baseline however she had a macrocytic anemia back in 2013, she is now on folic acid as a home medication and her Hgb remains at ~11g/dL however she is now normocytic and a new (since last recorded in EHR 10/18/13) thrombocytopenia.  However her daughter reports she recently was found to have a platelet count of 86k and has been referred to a hematologist although does not know about current workup of it. - Given neurologic finds of possible TIA, and new onset of  thrombocytopenia I am concerned for possible TTP, will obtain an LDH and Haptoglobin to assess for MAHA. -Repeat CBC -Smear review - Avoid Heparin/ Lovenox (SCDs) - Check B12, folate, iron stuides, retic count.  Elevated Alk Phos/ AST - Alk phos previously not elevated and AST very mildly elevated in 2013.  Will check a hepatitis panel especially given new thrombocytopenia.  Dispo: Disposition is deferred at this time, awaiting improvement of current medical problems.   The patient does have a current PCP (Elba Barman MD) and [facility].  The patient does not have transportation limitations that hinder transportation to clinic appointments.  Signed: ELucious Groves DO 12/21/2013, 4:46 PM

## 2013-12-21 NOTE — H&P (Signed)
Date: 12/21/2013               Patient Name:  Victoria Lindsey MRN: [id number]  DOB: 1925/09/29 Age / Sex: 80 y.o., female   PCP: Elba Barman, MD              Medical Service: Internal Medicine Teaching Service              Attending Physician: Dr. Karren Cobble, MD    First Contact: Jake Bathe, MS 4 Pager: [phone]  Second Contact: Dr. Joni Reining Pager: [phone]       After Hours (After 5p/  First Contact Pager: [phone]  weekends / holidays): Second Contact Pager: [phone]   Chief Complaint: Trouble speaking and R hand feeling funny  History of Present Illness: Ms. Schmutz is an 80 yo F with history of TIA, Parkinson's, HTN, hyperlipidemia, hypothyroidism, chronic dyspnea, Sjogren's syndrome, mild aortic stenosis and mitral regurgitation, CKD III, chronic anemia who presents with trouble speaking and R hand feeling "funny." The symptoms occurred yesterday for 1 hr and resolved spontaneously. She states that her hand did not feel numb or weak, but was not able to describe the abnormal sensation. Her husband describes her speech as "giberish." She denies residual symptoms. She reports her BP has been running high for the past several weeks, despite her PCP increasing her BP medications a couple weeks ago. She also endorses a dull HA located in her occipital region She also complains of chronic SOB, which has been evaluated in the past with spirometry and lung CT, which showed only an elevated L hemidiaphragm. She has never required supplemental oxygen or intubation. She reports compliance with her medications. She quit smoking about 50 years ago and has a 6 year pack history.   Patient lives at home with her husband, who does most of the household chores. Patient does not drive anymore due to her Parkinson's disease.   Meds: No current facility-administered medications for this encounter.   Current Outpatient Prescriptions  Medication Sig Dispense Refill  . aspirin 81 MG  tablet Take 81 mg by mouth daily.        Marland Kitchen BIOTIN PO Take 1,000 mcg by mouth.      . Blood Glucose Monitoring Suppl (ONE TOUCH ULTRA SYSTEM KIT) W/DEVICE KIT 1 kit by Does not apply route once.      . Calcium-Magnesium-Vitamin D [id number] MG-MG-UNIT CHEW Chew by mouth.      . cephALEXin (KEFLEX) 250 MG capsule Take 250 mg by mouth. Take 1 at bedtime.      . Cholecalciferol (VITAMIN D3) 2000 UNITS TABS Take by mouth.      . clopidogrel (PLAVIX) 75 MG tablet TAKE 1 TABLET BY MOUTH EVERY DAY  30 tablet  2  . Cranberry 500 MG CAPS Take by mouth.      . docusate sodium (COLACE) 100 MG capsule Take 100 mg by mouth daily.      Marland Kitchen ENSURE PLUS (ENSURE PLUS) LIQD Take 237 mLs by mouth.      . FOLIC ACID PO Take 0.6 mg by mouth.      Marland Kitchen glucose blood test strip 1 each by Other route as needed for other. Use as instructed      . levothyroxine (SYNTHROID, LEVOTHROID) 100 MCG tablet Take 100 mcg by mouth daily.      . metoprolol (LOPRESSOR) 50 MG tablet Take 1.5 tablets (75 mg total) by mouth 2 (two) times daily.  90 tablet  11  . Probiotic Product (ALIGN PO) Take by mouth.      . rasagiline (AZILECT) 1 MG TABS Take 1 mg by mouth daily.      . rosuvastatin (CRESTOR) 10 MG tablet       . Rotigotine 8 MG/24HR PT24 Place onto the skin.      Marland Kitchen spironolactone (ALDACTONE) 25 MG tablet Take 0.5 tablets (12.5 mg total) by mouth daily.  45 tablet  3    Allergies: Allergies as of 12/21/2013 - Review Complete 12/21/2013  Allergen Reaction Noted  . Aciphex [rabeprazole sodium]  12/17/2010  . Amantadines  12/17/2010  . Avapro [irbesartan]  12/17/2010  . Carbidopa-levodopa  12/17/2010  . Cardura [doxazosin mesylate]  12/17/2010  . Ciprofloxacin  12/17/2010  . Clonidine derivatives  12/17/2010  . Cozaar  08/26/2010  . Diltiazem  08/26/2010  . Famotidine  08/26/2010  . Hctz [hydrochlorothiazide]  12/17/2010  . Indapamide  12/17/2010  . Lansoprazole  12/17/2010  . Lisinopril  08/18/2011  . Loratadine   12/17/2010  . Maxidex [dexamethasone]  12/17/2010  . Mirapex [pramipexole dihydrochloride]  12/17/2010  . Norvasc [amlodipine besylate]  08/26/2010  . Pantoprazole sodium  08/26/2010  . Penicillins  08/26/2010  . Prilosec [omeprazole]  12/17/2010  . Procardia [nifedipine]  12/17/2010  . Proton pump inhibitors  12/17/2010  . Sulfa drugs cross reactors  08/26/2010  . Trimethoprim  12/17/2010  . Zyrtec [cetirizine hcl]  12/17/2010   Past Medical History  Diagnosis Date  . Hypertension   . Parkinson's disease   . Hyperlipidemia   . Dizziness   . Orthostatic hypotension   . Anemia   . Tremor   . Breast cancer     LEFT BREAST  . Kidney failure     stage 3  . Hypothyroidism    Past Surgical History  Procedure Laterality Date  . Cardiovascular stress test  06/2007    NORMAL  . Breast lumpectomy    . Cholecystectomy    . Tonsillectomy    . Nasal sinus surgery      submucous resection late 1950s   Family History  Problem Relation Age of Onset  . Heart disease Mother   . Heart failure Father   . Parkinsonism Father    History   Social History  . Marital Status: Married    Spouse Name: N/A    Number of Children: N/A  . Years of Education: N/A   Occupational History  . retired    Social History Main Topics  . Smoking status: Former Smoker -- 0.50 packs/day for 12 years    Types: Cigarettes    Quit date: 03/08/1964  . Smokeless tobacco: Not on file  . Alcohol Use: No  . Drug Use: No  . Sexual Activity: Not on file   Other Topics Concern  . Not on file   Social History Narrative  . No narrative on file   Review of Systems: Respiratory: positive for SOB, negative for cough Cardiovascular: negative for chest pain Gastrointestinal: negative for abdominal pain Neurological: negative for weakness or numbness, negative for confusion  Physical Exam: Blood pressure 189/91, pulse 80, temperature 98.7 F (37.1 C), temperature source Oral, resp. rate 16, SpO2  97.00%. General: Patient is thin-appearing, in no acute distress and cooperative with exam.  Cardiovascular: RRR, S1 normal, S2 normal, faint systolic murmur Pulmonary/Chest: Minimal scattered wheezing diffusely in all lung fields, loudest in upper airways Abdominal: Soft, nontender, nondistended, abdominal musculature tense with respiration Neuro: Alert and oriented  x3, EOMI, Strength and sensation intact throughout, CN 2-12 intact Extremities: 1+ edema to mid-calf bilaterally  Skin: Warm, dry and intact. No rashes or erythema. Psychiatric: Normal mood and affect. speech and behavior is normal. Cognition and memory are normal.   Lab results: Results for orders placed during the hospital encounter of 12/21/13 (from the past 24 hour(s))  COMPREHENSIVE METABOLIC PANEL     Status: Abnormal   Collection Time    12/21/13  1:51 PM      Result Value Ref Range   Sodium 133 (*) 137 - 147 mEq/L   Potassium 4.6  3.7 - 5.3 mEq/L   Chloride 96  96 - 112 mEq/L   CO2 26  19 - 32 mEq/L   Glucose, Bld 102 (*) 70 - 99 mg/dL   BUN 22  6 - 23 mg/dL   Creatinine, Ser 0.76  0.50 - 1.10 mg/dL   Calcium 9.5  8.4 - 10.5 mg/dL   Total Protein 8.0  6.0 - 8.3 g/dL   Albumin 3.6  3.5 - 5.2 g/dL   AST 60 (*) 0 - 37 U/L   ALT 61 (*) 0 - 35 U/L   Alkaline Phosphatase 146 (*) 39 - 117 U/L   Total Bilirubin 0.4  0.3 - 1.2 mg/dL   GFR calc non Af Amer 75 (*) >90 mL/min   GFR calc Af Amer 87 (*) >90 mL/min   Anion gap 11  5 - 15  PROTIME-INR     Status: None   Collection Time    12/21/13  1:51 PM      Result Value Ref Range   Prothrombin Time 14.7  11.6 - 15.2 seconds   INR 1.14  0.00 - 1.49  I-STAT TROPOININ, ED     Status: None   Collection Time    12/21/13  2:18 PM      Result Value Ref Range   Troponin i, poc 0.01  0.00 - 0.08 ng/mL   Comment 3           CBC WITH DIFFERENTIAL     Status: Abnormal   Collection Time    12/21/13  3:14 PM      Result Value Ref Range   WBC 4.8  4.0 - 10.5 K/uL   RBC  3.55 (*) 3.87 - 5.11 MIL/uL   Hemoglobin 11.1 (*) 12.0 - 15.0 g/dL   HCT 33.9 (*) 36.0 - 46.0 %   MCV 95.5  78.0 - 100.0 fL   MCH 31.3  26.0 - 34.0 pg   MCHC 32.7  30.0 - 36.0 g/dL   RDW 14.6  11.5 - 15.5 %   Platelets 85 (*) 150 - 400 K/uL   Neutrophils Relative % 55  43 - 77 %   Neutro Abs 2.6  1.7 - 7.7 K/uL   Lymphocytes Relative 26  12 - 46 %   Lymphs Abs 1.3  0.7 - 4.0 K/uL   Monocytes Relative 16 (*) 3 - 12 %   Monocytes Absolute 0.8  0.1 - 1.0 K/uL   Eosinophils Relative 3  0 - 5 %   Eosinophils Absolute 0.1  0.0 - 0.7 K/uL   Basophils Relative 0  0 - 1 %   Basophils Absolute 0.0  0.0 - 0.1 K/uL  URINALYSIS, ROUTINE W REFLEX MICROSCOPIC     Status: Abnormal   Collection Time    12/21/13  4:50 PM      Result Value Ref Range   Color, Urine  YELLOW  YELLOW   APPearance CLEAR  CLEAR   Specific Gravity, Urine 1.016  1.005 - 1.030   pH 6.5  5.0 - 8.0   Glucose, UA NEGATIVE  NEGATIVE mg/dL   Hgb urine dipstick MODERATE (*) NEGATIVE   Bilirubin Urine NEGATIVE  NEGATIVE   Ketones, ur NEGATIVE  NEGATIVE mg/dL   Protein, ur 100 (*) NEGATIVE mg/dL   Urobilinogen, UA 1.0  0.0 - 1.0 mg/dL   Nitrite NEGATIVE  NEGATIVE   Leukocytes, UA NEGATIVE  NEGATIVE  URINE MICROSCOPIC-ADD ON     Status: Abnormal   Collection Time    12/21/13  4:50 PM      Result Value Ref Range   Squamous Epithelial / LPF FEW (*) RARE   WBC, UA 3-6  <3 WBC/hpf   RBC / HPF 7-10  <3 RBC/hpf   Bacteria, UA FEW (*) RARE   Imaging results:  Dg Chest 2 View  12/21/2013   CLINICAL DATA:  Hypertension.  Headache and dizziness  EXAM: CHEST  2 VIEW  COMPARISON:  11/21/2013  FINDINGS: Cardiac enlargement without heart failure. Elevated left hemidiaphragm and left lower lobe atelectasis/scarring unchanged. Negative for pneumonia. Negative for pleural effusion  Moderate to severe chronic compression fracture approximately L1 unchanged from the prior study.  IMPRESSION: Chronic lung disease is stable.  No superimposed  acute abnormality.   Electronically Signed   By: Franchot Gallo M.D.   On: 12/21/2013 14:26   Ct Head Wo Contrast  12/21/2013   CLINICAL DATA:  Parkinson's.  Recent fall.  Fell and hit head today  EXAM: CT HEAD WITHOUT CONTRAST  TECHNIQUE: Contiguous axial images were obtained from the base of the skull through the vertex without intravenous contrast.  COMPARISON:  MRI 04/12/2012  FINDINGS: Generalized atrophy. Mild chronic microvascular ischemic change in the white matter.  Negative for acute infarct.  Negative for hemorrhage or mass.  Negative for skull fracture. Atherosclerotic disease in the cavernous carotid bilaterally. Mucosal thickening left sphenoid sinus.  IMPRESSION: Atrophy and chronic microvascular ischemia.  No acute abnormality   Electronically Signed   By: Franchot Gallo M.D.   On: 12/21/2013 15:48   Other results: EKG: Regular rate and rhythm, QT prolongation, old anterior infarct, unchanged from previous  Assessment & Plan by Problem: Active Problems:   TIA (transient ischemic attack)  TIA: 1hr history of trouble speaking and abnormal sensation in R hand, most consistent with a TIA. Patient has a prior history of TIAs and was taking Aspirin and Plavix at home. Head CT negative for hemorrhagic stroke, however will need to obtain MRI to rule out ischemic stroke. Per patient's wishes, will pursue full stroke workup for risk factor modification. Failed swallow screen secondary to chronic dysphagia. Had recent ECHO 8/15 which showed LV EF 75-80% and no other abnormalities. ABCD2 score = 5 (moderate stroke risk) - 4.1% in 2 days and 9.8% in 90 days. -HgbA1c, fasting lipid panel  -MRI, MRA of the brain without contrast  -PT consult, OT consult, Speech consult  -Carotid dopplers  -Continue Aspirin $RemoveBeforeDEI'81mg'RCwnhgbQVSQqVkSf$  daily and Plavix $RemoveBe'75mg'TolBFlfhq$  daily -Neurology following -Monitor on telemetry -Frequent neuro checks  HTN: BP elevated on admission with systolic in 914'N. On metoprolol $RemoveBefor'50mg'YNVSMgsBKmRe$  daily and  spironolactone $RemoveBeforeD'25mg'GhqKoAfkyWOijF$  daily. -hold home meds to allow permissive HTN in the setting of suspected TIA  Dyspnea: Patient reports subjective SOB with audible wheezing (althogh only few wheezes heard on lung exam). O2 saturation 100% on 2L [place]. Patient does not appear to be  in any respiratory distress. This is a chronic issue for her and she follows with pulmonology. Chest CT showed no abnormalities other than elevated L hemidiaphragm without paralysis. Spirometry unremarkable.  -Albuterol PRN  Anemia and Thrombocytopenia: Hgb 11.1 and platelet count 85 on admission. Patient is followed by a hematologist for her thrombocytopenia and platelets were reportedly 88 last week. According to chart review, patient had normal platelet count 2 months ago. Anemia appears to be a chronic issue, MCV is 95 but has had macrocytic anemia in the past. Differential includes TTP, Multiple Myeloma, Myelodysplasia, HIV, hepatitis panel. SPEP wnl in 2011. -will follow-up and obtain records from outpatient hematologist  -HIV antibody -Hepatitis panel -Anemia panel -LDH, Haptoglobin -repeat CBC -supplemental Folate  Elevated LFTs: Alkaline phosphatse 146; AST 60; ALT 61. Tbili wnl. -check hepatitis panel  Hypothryoidism: -Continue home synthroid 151mcg -check TSH  CKD III: Creatinine 0.76 (baseline ~1) -continue to monitor  Parkinson's: well controlled on home medications. No visible resting tremor -continue home meds  This is a Careers information officer Note.  The care of the patient was discussed with Dr. Heber [place] and the assessment and plan was formulated with their assistance.  Please see their note for official documentation of the patient encounter.   Signed: Ciro Backer, Med Student 12/21/2013, 5:29 PM

## 2013-12-21 NOTE — ED Notes (Signed)
Per Daughter, patient has a hx of dysphagia. Recently had a barium swallow test and has liquid thickener with her. Daughter wanting patient to drink water for a UTI. PA informed. RN swallow screen still failed due to hx and speech language path swallow screen ordered. IV fluid ordered for patient

## 2013-12-22 ENCOUNTER — Inpatient Hospital Stay (HOSPITAL_COMMUNITY): Payer: Medicare Other

## 2013-12-22 DIAGNOSIS — G459 Transient cerebral ischemic attack, unspecified: Secondary | ICD-10-CM | POA: Diagnosis present

## 2013-12-22 DIAGNOSIS — J9601 Acute respiratory failure with hypoxia: Secondary | ICD-10-CM | POA: Diagnosis present

## 2013-12-22 DIAGNOSIS — G20 Parkinson's disease: Secondary | ICD-10-CM | POA: Diagnosis present

## 2013-12-22 DIAGNOSIS — R740 Nonspecific elevation of levels of transaminase and lactic acid dehydrogenase [LDH]: Secondary | ICD-10-CM | POA: Diagnosis present

## 2013-12-22 DIAGNOSIS — J9600 Acute respiratory failure, unspecified whether with hypoxia or hypercapnia: Secondary | ICD-10-CM

## 2013-12-22 DIAGNOSIS — Z79899 Other long term (current) drug therapy: Secondary | ICD-10-CM | POA: Diagnosis not present

## 2013-12-22 DIAGNOSIS — Z7982 Long term (current) use of aspirin: Secondary | ICD-10-CM | POA: Diagnosis not present

## 2013-12-22 DIAGNOSIS — D649 Anemia, unspecified: Secondary | ICD-10-CM | POA: Diagnosis present

## 2013-12-22 DIAGNOSIS — K760 Fatty (change of) liver, not elsewhere classified: Secondary | ICD-10-CM | POA: Diagnosis present

## 2013-12-22 DIAGNOSIS — E780 Pure hypercholesterolemia: Secondary | ICD-10-CM

## 2013-12-22 DIAGNOSIS — R0689 Other abnormalities of breathing: Secondary | ICD-10-CM | POA: Diagnosis present

## 2013-12-22 DIAGNOSIS — E871 Hypo-osmolality and hyponatremia: Secondary | ICD-10-CM | POA: Diagnosis present

## 2013-12-22 DIAGNOSIS — E872 Acidosis: Secondary | ICD-10-CM | POA: Diagnosis present

## 2013-12-22 DIAGNOSIS — J811 Chronic pulmonary edema: Secondary | ICD-10-CM | POA: Diagnosis present

## 2013-12-22 DIAGNOSIS — I959 Hypotension, unspecified: Secondary | ICD-10-CM | POA: Diagnosis present

## 2013-12-22 DIAGNOSIS — J9602 Acute respiratory failure with hypercapnia: Secondary | ICD-10-CM | POA: Diagnosis present

## 2013-12-22 DIAGNOSIS — M3500 Sicca syndrome, unspecified: Secondary | ICD-10-CM | POA: Diagnosis present

## 2013-12-22 DIAGNOSIS — D696 Thrombocytopenia, unspecified: Secondary | ICD-10-CM | POA: Diagnosis present

## 2013-12-22 DIAGNOSIS — J690 Pneumonitis due to inhalation of food and vomit: Secondary | ICD-10-CM | POA: Diagnosis present

## 2013-12-22 DIAGNOSIS — J810 Acute pulmonary edema: Secondary | ICD-10-CM | POA: Diagnosis present

## 2013-12-22 DIAGNOSIS — Z87891 Personal history of nicotine dependence: Secondary | ICD-10-CM | POA: Diagnosis not present

## 2013-12-22 DIAGNOSIS — Z7902 Long term (current) use of antithrombotics/antiplatelets: Secondary | ICD-10-CM | POA: Diagnosis not present

## 2013-12-22 DIAGNOSIS — E039 Hypothyroidism, unspecified: Secondary | ICD-10-CM | POA: Diagnosis present

## 2013-12-22 DIAGNOSIS — R1310 Dysphagia, unspecified: Secondary | ICD-10-CM | POA: Diagnosis present

## 2013-12-22 DIAGNOSIS — I1310 Hypertensive heart and chronic kidney disease without heart failure, with stage 1 through stage 4 chronic kidney disease, or unspecified chronic kidney disease: Secondary | ICD-10-CM | POA: Diagnosis present

## 2013-12-22 DIAGNOSIS — G458 Other transient cerebral ischemic attacks and related syndromes: Secondary | ICD-10-CM

## 2013-12-22 DIAGNOSIS — Z87440 Personal history of urinary (tract) infections: Secondary | ICD-10-CM | POA: Diagnosis not present

## 2013-12-22 DIAGNOSIS — I10 Essential (primary) hypertension: Secondary | ICD-10-CM

## 2013-12-22 DIAGNOSIS — N183 Chronic kidney disease, stage 3 (moderate): Secondary | ICD-10-CM | POA: Diagnosis present

## 2013-12-22 LAB — BLOOD GAS, ARTERIAL
ACID-BASE EXCESS: 0.5 mmol/L (ref 0.0–2.0)
Bicarbonate: 25.4 mEq/L — ABNORMAL HIGH (ref 20.0–24.0)
DRAWN BY: 41977
FIO2: 50 %
LHR: 20 {breaths}/min
O2 SAT: 98.8 %
PATIENT TEMPERATURE: 98.6
PEEP: 5 cmH2O
TCO2: 26.8 mmol/L (ref 0–100)
VT: 370 mL
pCO2 arterial: 46.4 mmHg — ABNORMAL HIGH (ref 35.0–45.0)
pH, Arterial: 7.357 (ref 7.350–7.450)
pO2, Arterial: 179 mmHg — ABNORMAL HIGH (ref 80.0–100.0)

## 2013-12-22 LAB — GLUCOSE, CAPILLARY
GLUCOSE-CAPILLARY: 101 mg/dL — AB (ref 70–99)
GLUCOSE-CAPILLARY: 120 mg/dL — AB (ref 70–99)
GLUCOSE-CAPILLARY: 91 mg/dL (ref 70–99)
Glucose-Capillary: 91 mg/dL (ref 70–99)
Glucose-Capillary: 85 mg/dL (ref 70–99)

## 2013-12-22 LAB — LIPID PANEL
CHOL/HDL RATIO: 2.4 ratio
Cholesterol: 99 mg/dL (ref 0–200)
HDL: 41 mg/dL (ref >39)
LDL Cholesterol: 31 mg/dL (ref 0–99)
TRIGLYCERIDES: 133 mg/dL (ref <150)
VLDL: 27 mg/dL (ref 0–40)

## 2013-12-22 LAB — POCT I-STAT 3, ART BLOOD GAS (G3+)
Acid-Base Excess: 2 mmol/L (ref 0.0–2.0)
BICARBONATE: 28.1 meq/L — AB (ref 20.0–24.0)
O2 Saturation: 100 %
PCO2 ART: 51 mmHg — AB (ref 35.0–45.0)
PO2 ART: 424 mmHg — AB (ref 80.0–100.0)
Patient temperature: 98.6
TCO2: 30 mmol/L (ref 0–100)
pH, Arterial: 7.349 — ABNORMAL LOW (ref 7.350–7.450)

## 2013-12-22 LAB — HEPATIC FUNCTION PANEL
ALBUMIN: 3 g/dL — AB (ref 3.5–5.2)
ALT: 57 U/L — AB (ref 0–35)
AST: 58 U/L — ABNORMAL HIGH (ref 0–37)
Alkaline Phosphatase: 136 U/L — ABNORMAL HIGH (ref 39–117)
Bilirubin, Direct: <0.2 mg/dL (ref 0.0–0.3)
Total Bilirubin: 0.4 mg/dL (ref 0.3–1.2)
Total Protein: 6.9 g/dL (ref 6.0–8.3)

## 2013-12-22 LAB — HEMOGLOBIN A1C
Hgb A1c MFr Bld: 6.4 % — ABNORMAL HIGH (ref <5.7)
Mean Plasma Glucose: 137 mg/dL — ABNORMAL HIGH (ref <117)

## 2013-12-22 LAB — HEPATITIS PANEL, ACUTE
HCV AB: NEGATIVE
Hep A IgM: NONREACTIVE
Hep B C IgM: NONREACTIVE
Hepatitis B Surface Ag: NEGATIVE

## 2013-12-22 LAB — BASIC METABOLIC PANEL
ANION GAP: 14 (ref 5–15)
BUN: 22 mg/dL (ref 6–23)
CO2: 23 mEq/L (ref 19–32)
Calcium: 8.6 mg/dL (ref 8.4–10.5)
Chloride: 98 mEq/L (ref 96–112)
Creatinine, Ser: 0.71 mg/dL (ref 0.50–1.10)
GFR calc non Af Amer: 76 mL/min — ABNORMAL LOW (ref >90)
GFR, EST AFRICAN AMERICAN: 89 mL/min — AB (ref >90)
Glucose, Bld: 111 mg/dL — ABNORMAL HIGH (ref 70–99)
POTASSIUM: 4.7 meq/L (ref 3.7–5.3)
SODIUM: 135 meq/L — AB (ref 137–147)

## 2013-12-22 LAB — CBC
HEMATOCRIT: 33.7 % — AB (ref 36.0–46.0)
HEMOGLOBIN: 10.9 g/dL — AB (ref 12.0–15.0)
MCH: 31.6 pg (ref 26.0–34.0)
MCHC: 32.3 g/dL (ref 30.0–36.0)
MCV: 97.7 fL (ref 78.0–100.0)
Platelets: 115 10*3/uL — ABNORMAL LOW (ref 150–400)
RBC: 3.45 MIL/uL — AB (ref 3.87–5.11)
RDW: 14.7 % (ref 11.5–15.5)
WBC: 7.9 10*3/uL (ref 4.0–10.5)

## 2013-12-22 LAB — FOLATE: Folate: >20 ng/mL

## 2013-12-22 LAB — FERRITIN: FERRITIN: 145 ng/mL (ref 10–291)

## 2013-12-22 LAB — VITAMIN B12: Vitamin B-12: 753 pg/mL (ref 211–911)

## 2013-12-22 LAB — HIV ANTIBODY (ROUTINE TESTING W REFLEX): HIV 1&2 Ab, 4th Generation: NONREACTIVE

## 2013-12-22 LAB — HAPTOGLOBIN: HAPTOGLOBIN: 148 mg/dL (ref 45–215)

## 2013-12-22 LAB — CALCIUM, IONIZED: Calcium, Ion: 1.17 mmol/L (ref 1.13–1.30)

## 2013-12-22 MED ORDER — CHLORHEXIDINE GLUCONATE 0.12 % MT SOLN
15.0000 mL | Freq: Two times a day (BID) | OROMUCOSAL | Status: DC
  Administered 2013-12-22 – 2013-12-23 (×3): 15 mL via OROMUCOSAL
  Filled 2013-12-22 (×3): qty 15

## 2013-12-22 MED ORDER — RESOURCE THICKENUP CLEAR PO POWD
ORAL | Status: DC | PRN
  Filled 2013-12-22: qty 125

## 2013-12-22 MED ORDER — CETYLPYRIDINIUM CHLORIDE 0.05 % MT LIQD
7.0000 mL | Freq: Four times a day (QID) | OROMUCOSAL | Status: DC
  Administered 2013-12-22 – 2013-12-23 (×6): 7 mL via OROMUCOSAL

## 2013-12-22 MED ORDER — SUCRALFATE 1 GM/10ML PO SUSP
1.0000 g | Freq: Four times a day (QID) | ORAL | Status: DC
  Administered 2013-12-22 (×2): 1 g via ORAL
  Filled 2013-12-22 (×7): qty 10

## 2013-12-22 MED ORDER — FENTANYL CITRATE 0.05 MG/ML IJ SOLN: 12.5000 ug | INTRAMUSCULAR | Status: DC | PRN

## 2013-12-22 NOTE — Progress Notes (Addendum)
*  PRELIMINARY RESULTS* Vascular Ultrasound Carotid Duplex (Doppler) has been completed.  Preliminary findings: Bilateral:  1-39% ICA stenosis.  Vertebral artery flow is antegrade.    Landry Mellow, RDMS, RVT   12/22/2013, 10:57 AM

## 2013-12-22 NOTE — Progress Notes (Signed)
STROKE TEAM PROGRESS NOTE   HISTORY  Victoria Lindsey is an 80 y.o. female who is currently on ASA and Plavix. Patient woke up yesterday around 0700 hours and noted she was unable to express herself and had right felt funny for about one hour. Her family took her BP and noted it was elevated (systolically in the 597'C) all day (but also notes she has been struggling with her BP for about one year). This AM she awoke and family noted she was having SOB. Due to multiple medical issues she was brought to ED. Daughter notes she did give her a extra 1/2 Spironolactone due to elevated BP. Currently her BP remains elevated in 163'A systolic and patient is asymptomatic.   Date last known well: Date: 12/20/2013  Time last known well: Time: 07:00  tPA Given: No: resolved symptoms  SUBJECTIVE (INTERVAL HISTORY) Her daughter and husband are at the bedside.  Overall she feels her condition is rapidly improving. She still intubated but no distress and following all commands well. She had one episode at 2 days ago about right hand felt funny and difficulty get words out. Had similar episode many years ago and was told to have TIA. She came in yesterday due to difficult BP control for the last 2-3 weeks, as well as CP and SOB since yesterday. She had MRI yesterday no stroke but may have aspiration during MRI and developed SOB and CXR concerning for pulmonary edema, so she was intubated. Her condition improved over night and she had no distress now.   For the last several weeks, she has difficultly to control BP. During the episode, she did not check her BP. On arrival to ER, her BP was in 200s.    OBJECTIVE Temp:  [97.8 F (36.6 C)-98.7 F (37.1 C)] 97.8 F (36.6 C) (10/17 0800) Pulse Rate:  [71-110] 85 (10/17 0800) Cardiac Rhythm:  [-] Normal sinus rhythm (10/17 0800) Resp:  [15-26] 20 (10/17 0800) BP: (82-243)/(36-125) 166/125 mmHg (10/17 0800) SpO2:  [92 %-100 %] 100 % (10/17 0800) FiO2 (%):  [40 %-100 %]  40 % (10/17 0800) Weight:  [121 lb 14.6 oz (55.3 kg)] 121 lb 14.6 oz (55.3 kg) (10/17 0500)   Recent Labs Lab 12/22/13 0011 12/22/13 0407  GLUCAP 120* 101*    Recent Labs Lab 12/21/13 1351 12/21/13 2300 12/22/13 0239  NA 133* 135* 135*  K 4.6 4.2 4.7  CL 96 100 98  CO2 26 25 23   GLUCOSE 102* 130* 111*  BUN 22 21 22   CREATININE 0.76 0.68 0.71  CALCIUM 9.5 8.4 8.6    Recent Labs Lab 12/21/13 1351 12/21/13 2300 12/22/13 0239  AST 60* 56* 58*  ALT 61* 55* 57*  ALKPHOS 146* 135* 136*  BILITOT 0.4 0.3 0.4  PROT 8.0 6.7 6.9  ALBUMIN 3.6 2.9* 3.0*    Recent Labs Lab 12/21/13 1514 12/21/13 1940 12/21/13 2300 12/22/13 0239  WBC 4.8 10.4 7.7 7.9  NEUTROABS 2.6  --   --   --   HGB 11.1* 12.0 10.5* 10.9*  HCT 33.9* 37.5 32.4* 33.7*  MCV 95.5 95.9 97.9 97.7  PLT 85* 144* 94* 115*   No results found for this basename: CKTOTAL, CKMB, CKMBINDEX, TROPONINI,  in the last 168 hours  Recent Labs  12/21/13 1351 12/21/13 2300  LABPROT 14.7 15.3*  INR 1.14 1.19    Recent Labs  12/21/13 1650  COLORURINE YELLOW  LABSPEC 1.016  PHURINE 6.5  GLUCOSEU NEGATIVE  HGBUR MODERATE*  BILIRUBINUR NEGATIVE  KETONESUR NEGATIVE  PROTEINUR 100*  UROBILINOGEN 1.0  NITRITE NEGATIVE  LEUKOCYTESUR NEGATIVE       Component Value Date/Time   CHOL 99 12/22/2013 0239   TRIG 133 12/22/2013 0239   HDL 41 12/22/2013 0239   CHOLHDL 2.4 12/22/2013 0239   VLDL 27 12/22/2013 0239   LDLCALC 31 12/22/2013 0239   Lab Results  Component Value Date   HGBA1C 6.4* 12/21/2013   No results found for this basename: labopia, cocainscrnur, labbenz, amphetmu, thcu, labbarb    No results found for this basename: ETH,  in the last 168 hours  2-D echo 10/25/2013 Left ventricle: The cavity size was normal. Wall thickness was increased in a pattern of mild LVH. Systolic function was hyperdynamic. The estimated ejection fraction was in the range of 75% to 80%. Wall motion was normal; there  were no regional wall motion abnormalities. There was an increased relative contribution of atrial contraction to ventricular filling. Doppler parameters are consistent with abnormal left ventricular relaxation (grade 1 diastolic dysfunction). Aortic valve: There was mild regurgitation. Left atrium: The atrium was mildly dilated.  Dg Chest 2 View 12/21/2013    Chronic lung disease is stable.  No superimposed acute abnormality.     Ct Head Wo Contrast 12/21/2013    Atrophy and chronic microvascular ischemia.  No acute abnormality     Mr Brain Wo Contrast 12/21/2013  No acute or subacute infarction. Moderate chronic small-vessel ischemic changes throughout the brain as outlined above.       CUS - Bilateral: 1-39% ICA stenosis. Vertebral artery flow is antegrade.   PHYSICAL EXAM Physical exam  Temp:  [97.8 F (36.6 C)-98.9 F (37.2 C)] 98.9 F (37.2 C) (10/17 1100) Pulse Rate:  [72-110] 97 (10/17 1400) Resp:  [12-34] 19 (10/17 1400) BP: (82-243)/(36-125) 159/45 mmHg (10/17 1400) SpO2:  [92 %-100 %] 100 % (10/17 1400) FiO2 (%):  [40 %-100 %] 40 % (10/17 1049) Weight:  [121 lb 14.6 oz (55.3 kg)] 121 lb 14.6 oz (55.3 kg) (10/17 0500)  General - Well nourished, well developed, in no apparent distress, still intubated, but following commands fully.  Ophthalmologic - not able to see through.  Cardiovascular - Regular rate and rhythm with no murmur.  Mental Status -  Awake alert, following all commands, intubated. Language comprehension was assessed and found intact.  Cranial Nerves II - XII - II - blink to visual threat bilaterally. III, IV, VI - Extraocular movements intact. V - Facial sensation intact bilaterally. VII - nasolabial fold difficult to assess due to the tape for ET tube. VIII - Hearing & vestibular intact bilaterally. X - not able to test due to ET tube. XI - Chin turning & shoulder shrug intact bilaterally. XII - Tongue protrusion attempt made.  Motor  Strength - The patient's strength was 5-/5 UEs and 4/5 LEs and pronator drift was absent.  Bulk was normal and fasciculations were absent. Initially right foot weak on dorsi and plantar flexion but later showed 4/5 strength also.   Motor Tone - Muscle tone was assessed at the neck and appendages and was increased muscle tone LEs>UEs.  Reflexes - The patient's reflexes were 1+ in all extremities and she had no pathological reflexes.  Sensory - Light touch, temperature/pinprick testing were assessed and were normal.    Coordination - The patient had normal movements in the hands with no ataxia or dysmetria.  Tremor was present at feet bilaterally.  Gait and Station - not tested  due to intubation.   ASSESSMENT/PLAN Ms. Victoria Lindsey is an 80 y.o. female with history of TIA, Parkinson's disease, multiple drug allergies, hypertension, hyperlipidemia, hypothyroidism, Sjogren's syndrome, valvular abnormalities, chronic kidney disease (stage III), and chronic anemia  Admitted for CP and SOB. But she stated that 2 days ago she had transient difficulty in getting words out and right arm felt funny, lasting about one hour and resolved. Neurology consulted. For the last several weeks, her BP was high. In ER, SBP is at 200s. She did not check BP during the episode  TIA vs. Hypertensive encephalopathy - episodic language difficulty and vague right hand funny feeling can not ddx TIA or hypertensive encephalopathy.    MRI  No acute or subacute infarction.  MRA  pending  Carotid Doppler unremarkable  2D Echo  (10/25/2013) see above - no cardiac source of emboli  LDL 31 at the goal < 70  HgbA1c 6.4  SCDs for VTE prophylaxis  aspirin 81 mg orally every day and clopidogrel 75 mg orally every day prior to admission, now on aspirin 81 mg orally every day and clopidogrel 75 mg orally every day  Ongoing aggressive risk factor management  Resultant resolution of deficits  Therapy recommendations:   Pending  Disposition:  Pending  Due to the concerning of difficulty getting words out being potentially TIA with cardioembolic source, we recommend 30 day outpt cardiac event monitoring to evaluate for afib.  Hypertension  Home meds:   Metoprolol 75 mg twice a day; Aldactone 12.5 mg daily;   Stable  Avoid hypotension  BP goal normotensive  Hyperlipidemia  Home meds:  Crestor - resumed in hospital  LDL 31, at goal   Continue statin at discharge   Other Stroke Risk Factors Advanced age Hx TIA  Other Active Problems  Intubated - pulmonary edema / question aspiration  Hyponatremia  Elevated liver function tests  Hospital day # 1  [name], MD PhD Stroke Neurology 12/22/2013 4:27 PM     To contact Stroke Continuity provider, please refer to [email or web address] After hours, contact General Neurology

## 2013-12-22 NOTE — Progress Notes (Signed)
eLink Physician-Brief Progress Note Patient Name: Victoria Lindsey DOB: [birth year] MRN: [id number]   Date of Service  12/22/2013  HPI/Events of Note  Reported allergies to PPI and H2 blockers - currently vented - need for stress ulcer propy  eICU Interventions  Plan: Start carafate q6 hours     Intervention Category Intermediate Interventions: Best-practice therapies (e.g. DVT, beta blocker, etc.)  DETERDING,ELIZABETH 12/22/2013, 12:27 AM

## 2013-12-22 NOTE — Progress Notes (Signed)
BRIEF PATIENT DESCRIPTION: 44 F with Parkinson's Disease, CKD, HTN, pre-DM who presented to [facility] on 10/16 with likely TIA subsequently developed severe hypoxic respiratory failure almost certainly 2/2 aspiration.   SIGNIFICANT EVENTS / STUDIES:  CT Head 10/16 No acute intracranial process  MRI Brain 10/16 No acute intracranial process  ABG pre intubation 7.128/77.3/24.5/98.9  Intubation 12/21/2013  Extubation 10/17  LINES / TUBES:  PIV X 2  ETT 10/16 >> 10/178  SUBJ: RASS 0. + F/C. Passed SBT. Extubated and looks good post extubation   Elderly in NAD No JVD No adventitious sounds RRR s M Abd soft, +BS Ext warm without edema No focal neuro deficits   I have reviewed all of today's lab results. Relevant abnormalities are discussed in the A/P section  IMPRESSION: Acute Hypoxic and Hypercarbic Respiratory Failure, resolved Concern for aspiration event Hypotension: Transient and likely post-intubation. Responded to minimal post-intubation fluids.  H/O HTN Chronic Thrombocytopenia: Better than baseline Concern for TIAs Hypothyroidism:  Impaired Fasting Glucose  PLAN: Extubated 10/17 Monitor in ICU post extubation Swallow eval and advance diet as tolerated Cont meropenem for now MRI ordered by Neuro   Merton Border, MD ; [facility] service Mobile [phone].  After 5:30 PM or weekends, call [phone]

## 2013-12-22 NOTE — Progress Notes (Signed)
SLP Cancellation Note  Patient Details Name: Victoria Lindsey MRN: [id number] DOB: [birth year]   Cancelled treatment:       Reason Eval/Treat Not Completed: Medical issues which prohibited therapy; pt recently intubated prohibiting BSE   Edithe Dobbin,PAT, M.S., CCC-SLP 12/22/2013, 10:50 AM

## 2013-12-22 NOTE — Progress Notes (Signed)
Dr. Alva Garnet wrote for patient to NPO and SLP bedside swallow was ordered. Asked Dr. Alva Garnet how we would give PO medications if SLP would not see patient right away. Suggested that patient uses nectar thickened liquids at home and he said to do a nurse bedside swallow with that and to give medications with that if she passes. If she were to fail, to continue being NPO and await SLP evaluaion. Ordered Nectar-thick powder from pharmacy and awaiting arrival. Passed on to [place]. Richardean Sale, RN

## 2013-12-22 NOTE — Progress Notes (Signed)
Patient has been on CPAP for with a peep of 5 for a little over an hour. She has tolerated it well and with Dr. Alva Garnet order, patient was extubated at 1200 noon. Currently on 3L [place] sat 100%. Patient is appropriate and following commands. Will continue to monitor. Richardean Sale, RN

## 2013-12-22 NOTE — Procedures (Signed)
Extubation Procedure Note  Patient Details:   Name: Victoria Lindsey DOB: [birth year] MRN: [id number]   Airway Documentation:     Evaluation  O2 sats: stable throughout Complications: No apparent complications Patient did tolerate procedure well. Bilateral Breath Sounds: Clear;Diminished Suctioning: Oral Yes  Pt tolerated wean, positive for cuff leak, extubated to 3L [place]. No stridor or dyspnea noted after extubation. All vital signs are currently within normal limits. RT will continue to monitor.   Mariam Dollar 12/22/2013, 12:06 PM

## 2013-12-22 NOTE — Significant Event (Signed)
Rapid Response Event Note Pt admitted today for TIA/Stroke workup. MRI done in ED just prior to transport to floor. Pt developed severe SOB, accessory muscle use, and elevated RR at shift change after arriving form ED. RRT called at 1850, day shift RRt RN at scene with Respiratory therapist, bedside RN, and Resident Dr. Heber [place]. NRB applied .  Overview: Time Called: 1910 Arrival Time: 1910 Event Type: Respiratory;Cardiac  Initial Focused Assessment: Pt found in bed, using accessory muscles  rr 30-40s, 02 sats 98-100% on NRB. BP elevated. Lungs course and congested with crackles. Pt initially able to follow commands and answer yes and no but very weak.   Interventions: Per Resident, to place BiPAPt for respiratory support initially and tranfer to SD. While awaiting BiPAP machine, pt WOB continued to worsen and it was decided to consult PCCM MD, whom was delayed getting to scene for assessment. ABG (7.128/77.3/24.5/98.9) and PCXR obtained. CXR resulting with pulmonary edema. Attending MD Dr Eppie Gibson called to bedside and plan of care discussed with pt and family at bedside. Per Dr. Eppie Gibson pt most likely aspirated along with pulmonary edema.  Oral airway suctioned with Yankauer, yielding small amount of tan thick sputum, pt with no gag reflex.  40 mg Lasix IV given. Upon arrival of Dr.Belanger PCCM at 2030, it was decided to move pt to ICU for urgent intubation. Family at bedside discussed code status with Dr. Lowry Ram and consent to intubation tonight, full code. Pt transferred to [facility] ICU and intubated.  Event Summary: Name of Physician Notified: Resident Dr.Moding at [place] (at bedside upon my arrival)  Name of Consulting Physician Notified: PCCM Dr. Bing Neighbors at 2000  Outcome: Transferred (Comment)  Event End time :2130  Ranell Patrick Biran [place]

## 2013-12-23 ENCOUNTER — Other Ambulatory Visit: Payer: Self-pay | Admitting: Neurology

## 2013-12-23 DIAGNOSIS — G459 Transient cerebral ischemic attack, unspecified: Secondary | ICD-10-CM

## 2013-12-23 DIAGNOSIS — R002 Palpitations: Secondary | ICD-10-CM

## 2013-12-23 LAB — GLUCOSE, CAPILLARY
GLUCOSE-CAPILLARY: 80 mg/dL (ref 70–99)
GLUCOSE-CAPILLARY: 94 mg/dL (ref 70–99)
Glucose-Capillary: 91 mg/dL (ref 70–99)
Glucose-Capillary: 82 mg/dL (ref 70–99)

## 2013-12-23 MED ORDER — NON FORMULARY
8.0000 mg | Freq: Every day | Status: DC
Start: 2013-12-23 — End: 2013-12-23

## 2013-12-23 MED ORDER — METOPROLOL TARTRATE 50 MG PO TABS
75.0000 mg | ORAL_TABLET | Freq: Two times a day (BID) | ORAL | Status: DC
  Administered 2013-12-23 – 2013-12-27 (×8): 75 mg via ORAL
  Filled 2013-12-23 (×16): qty 1
  Filled 2013-12-23: qty 2

## 2013-12-23 MED ORDER — METOPROLOL TARTRATE 1 MG/ML IV SOLN
2.5000 mg | INTRAVENOUS | Status: DC | PRN
  Administered 2013-12-23: 2.5 mg via INTRAVENOUS
  Filled 2013-12-23: qty 5

## 2013-12-23 MED ORDER — HYDRALAZINE HCL 20 MG/ML IJ SOLN
10.0000 mg | INTRAMUSCULAR | Status: DC | PRN
  Administered 2013-12-23 – 2013-12-24 (×3): 20 mg via INTRAVENOUS
  Filled 2013-12-23 (×3): qty 1

## 2013-12-23 MED ORDER — NON FORMULARY
4.0000 mg | Freq: Every day | Status: DC
Start: 2013-12-23 — End: 2013-12-23

## 2013-12-23 NOTE — Evaluation (Signed)
Clinical/Bedside Swallow Evaluation Patient Details  Name: Victoria Lindsey MRN: [id number] Date of Birth: [birth year]  Today's Date: 12/23/2013 Time: 5284-1324 SLP Time Calculation (min): 12 min  Past Medical History:  Past Medical History  Diagnosis Date  . Hypertension   . Parkinson's disease   . Hyperlipidemia   . Dizziness   . Orthostatic hypotension   . Anemia   . Tremor   . Breast cancer     LEFT BREAST  . Kidney failure     stage 3  . Hypothyroidism   . Prediabetes    Past Surgical History:  Past Surgical History  Procedure Laterality Date  . Cardiovascular stress test  06/2007    NORMAL  . Breast lumpectomy    . Cholecystectomy    . Tonsillectomy    . Nasal sinus surgery      submucous resection late 1950s   HPI:  89 F with Parkinson's Disease, CKD, HTN, pre-DM who presented to [facility] on 10/16 with likely TIA difficulty in getting words out and right arm felt funny, lasting about one hour and resolved. Subsequently developed severe hypoxic respiratory failure almost certainly 2/2 aspiration during MRI. Intubated from 10/16 to 10/17. MRI showed no acute finding. Pt reproted in chart to have a history of dysphagia and drinks nectar thick liquids at home but no notes from SLP are available.    Assessment / Plan / Recommendation Clinical Impression  Pt demonstrates no evidence of aspiration when provided with nectar thick liquids and cues for a chin tuck as previously instructed by [facility] SLP. Pt HR and RR intermittently high during session though swallow function still adequate. She does report occasional indigestion and stomach pressure and need to burp following trials. RN plans to discuss with MD as she is not currently on any medications for this. Will downgrade diet to Mechanical soft and nectar thick liquids to ensure pre thickened liquids arrive on her tray. Will f/u for tolerance this week.     Aspiration Risk  Mild    Diet Recommendation Dysphagia 3 (Mechanical  Soft);Nectar-thick liquid   Liquid Administration via: Straw Medication Administration: Whole meds with liquid Supervision: Full supervision/cueing for compensatory strategies Compensations: Slow rate;Small sips/bites Postural Changes and/or Swallow Maneuvers: Chin tuck;Seated upright 90 degrees;Upright 30-60 min after meal    Other  Recommendations Oral Care Recommendations: Oral care BID Other Recommendations: Order thickener from pharmacy   Follow Up Recommendations  Skilled Nursing facility    Frequency and Duration min 2x/week  2 weeks   Pertinent Vitals/Pain NA    SLP Swallow Goals     Swallow Study Prior Functional Status       General HPI: 77 F with Parkinson's Disease, CKD, HTN, pre-DM who presented to [facility] on 10/16 with likely TIA difficulty in getting words out and right arm felt funny, lasting about one hour and resolved. Subsequently developed severe hypoxic respiratory failure almost certainly 2/2 aspiration during MRI. Intubated from 10/16 to 10/17. MRI showed no acute finding. Pt reproted in chart to have a history of dysphagia and drinks nectar thick liquids at home but no notes from SLP are available.  Type of Study: Bedside swallow evaluation Previous Swallow Assessment: MBS at [facility] in September - regular textures, nectar thick liquids with a chin tuck and a straw Diet Prior to this Study: Regular;Nectar-thick liquids Temperature Spikes Noted: No Respiratory Status: Nasal cannula History of Recent Intubation: Yes Length of Intubations (days): 2 days Date extubated: 12/22/13 Behavior/Cognition: Alert;Cooperative;Pleasant mood;Distractible;Requires cueing (  anxious) Oral Cavity - Dentition: Adequate natural dentition Self-Feeding Abilities: Needs assist Patient Positioning: Upright in bed Baseline Vocal Quality: Clear Volitional Cough: Strong Volitional Swallow: Able to elicit    Oral/Motor/Sensory Function Overall Oral Motor/Sensory Function:  Appears within functional limits for tasks assessed   Ice Chips     Thin Liquid Thin Liquid: Not tested    Nectar Thick Nectar Thick Liquid: Within functional limits Presentation: Straw Other Comments: chin tuck   Honey Thick Honey Thick Liquid: Not tested   Puree Puree: Not tested   Solid   GO    Solid: Not tested Other Comments: pt refused      Herbie Baltimore, MA CCC-SLP [phone]  Dale Ribeiro, Katherene Ponto 12/23/2013,3:33 PM

## 2013-12-23 NOTE — Plan of Care (Signed)
Problem: Phase I Progression Outcomes Goal: Progressing towards optiumm acitivities Outcome: Progressing Working with PT

## 2013-12-23 NOTE — Plan of Care (Signed)
Problem: Phase II Progression Outcomes Goal: Discharge/transfer plan updated Outcome: Completed/Met Date Met:  12/23/13 Transfer to step-down orders, SNF or rehab in plans

## 2013-12-23 NOTE — Progress Notes (Signed)
BRIEF PATIENT DESCRIPTION: 11 F with Parkinson's Disease, CKD, HTN, pre-DM who presented to [facility] on 10/16 with likely TIA subsequently developed severe hypoxic respiratory failure almost certainly 2/2 aspiration.   SIGNIFICANT EVENTS / STUDIES:  CT Head 10/16 No acute intracranial process  MRI Brain 10/16 No acute intracranial process  ABG pre intubation 7.128/77.3/24.5/98.9  Intubation 12/21/2013  Extubation 10/17  LINES / TUBES:  PIV X 2  ETT 10/16 >> 10/178  SUBJ: No problems post extubation   Elderly in NAD No JVD No adventitious sounds RRR s M Abd soft, +BS Ext warm without edema No focal neuro deficits   I have reviewed all of today's lab results. Relevant abnormalities are discussed in the A/P section  IMPRESSION: Acute Hypoxic and Hypercarbic Respiratory Failure, resolved Concern for aspiration event - CXR much improved Hypotension, resolved.  H/O HTN Chronic Thrombocytopenia Concern for TIAs Hypothyroidism Impaired Fasting Glucose Dysphagia - daughter reports recent MBS @ [facility]. Only restriction was nectar thick liquids  PLAN: Extubated 10/17 DC meropenem  Begin CHO mod diet with nectar thick liquids and full supervision Swallow eval and advance diet as tolerated Transfer to SDU IMTS to resume primary duties and PCCM to sign off   Merton Border, MD ; [facility] [phone].  After 5:30 PM or weekends, call [phone]

## 2013-12-23 NOTE — Progress Notes (Signed)
Dr. Alva Garnet called regarding patients increase in heart rate and blood pressure. See mar for new orders. Will continue to monitor.  Sandre Kitty

## 2013-12-23 NOTE — H&P (Signed)
  I have seen and examined the patient myself, and I have reviewed the note by Jake Bathe, MS 4 and was present during the interview and physical exam.  Please see my separate H&P for additional findings, assessment, and plan.   Signed: Lucious Groves, DO 12/23/2013, 7:08 AM

## 2013-12-23 NOTE — Progress Notes (Signed)
E-link md paged due to only 150 cc in bladder after scan from foley being removed at 1100. No new orders.  Will continue to monitor patient and push fluids. Sandre Kitty

## 2013-12-23 NOTE — Progress Notes (Signed)
IMTS to resume care in AM, patient to be transferred out of ICU to stepdown per Dr. Alva Garnet.  -Victoria Groves, DO 12/23/13 11:18 AM

## 2013-12-23 NOTE — Progress Notes (Signed)
STROKE TEAM PROGRESS NOTE   HISTORY  Victoria Lindsey is an 80 y.o. female who is currently on ASA and Plavix. Patient woke up yesterday around 0700 hours and noted she was unable to express herself and had right felt funny for about one hour. Her family took her BP and noted it was elevated (systolically in the 202'R) all day (but also notes she has been struggling with her BP for about one year). This AM she awoke and family noted she was having SOB. Due to multiple medical issues she was brought to ED. Daughter notes she did give her a extra 1/2 Spironolactone due to elevated BP. Currently her BP remains elevated in 427'C systolic and patient is asymptomatic.   Date last known well: Date: 12/20/2013  Time last known well: Time: 07:00  tPA Given: No: resolved symptoms  SUBJECTIVE (INTERVAL HISTORY) Her daughter and husband are at the bedside.  She has been extubated, on room air without problem. Neuro improved and stable. Not tolerated MRI last time, will cancel MRA today as it will not change management. Recommend outpt 30 day event monitor to rule out afib.   OBJECTIVE Temp:  [97.8 F (36.6 C)-98.2 F (36.8 C)] 97.8 F (36.6 C) (10/18 1100) Pulse Rate:  [91-115] 109 (10/18 1300) Cardiac Rhythm:  [-] Sinus tachycardia (10/18 0800) Resp:  [13-23] 19 (10/18 1300) BP: (116-180)/(31-123) 160/59 mmHg (10/18 1300) SpO2:  [92 %-100 %] 93 % (10/18 1300) Weight:  [119 lb 14.9 oz (54.4 kg)] 119 lb 14.9 oz (54.4 kg) (10/18 0448)   Recent Labs Lab 12/22/13 2011 12/22/13 2357 12/23/13 0401 12/23/13 0812 12/23/13 1203  GLUCAP 91 91 82 80 94    Recent Labs Lab 12/21/13 1351 12/21/13 2300 12/22/13 0239  NA 133* 135* 135*  K 4.6 4.2 4.7  CL 96 100 98  CO2 26 25 23   GLUCOSE 102* 130* 111*  BUN 22 21 22   CREATININE 0.76 0.68 0.71  CALCIUM 9.5 8.4 8.6    Recent Labs Lab 12/21/13 1351 12/21/13 2300 12/22/13 0239  AST 60* 56* 58*  ALT 61* 55* 57*  ALKPHOS 146* 135* 136*   BILITOT 0.4 0.3 0.4  PROT 8.0 6.7 6.9  ALBUMIN 3.6 2.9* 3.0*    Recent Labs Lab 12/21/13 1514 12/21/13 1940 12/21/13 2300 12/22/13 0239  WBC 4.8 10.4 7.7 7.9  NEUTROABS 2.6  --   --   --   HGB 11.1* 12.0 10.5* 10.9*  HCT 33.9* 37.5 32.4* 33.7*  MCV 95.5 95.9 97.9 97.7  PLT 85* 144* 94* 115*   No results found for this basename: CKTOTAL, CKMB, CKMBINDEX, TROPONINI,  in the last 168 hours  Recent Labs  12/21/13 1351 12/21/13 2300  LABPROT 14.7 15.3*  INR 1.14 1.19    Recent Labs  12/21/13 1650  COLORURINE YELLOW  LABSPEC 1.016  PHURINE 6.5  GLUCOSEU NEGATIVE  HGBUR MODERATE*  BILIRUBINUR NEGATIVE  KETONESUR NEGATIVE  PROTEINUR 100*  UROBILINOGEN 1.0  NITRITE NEGATIVE  LEUKOCYTESUR NEGATIVE       Component Value Date/Time   CHOL 99 12/22/2013 0239   TRIG 133 12/22/2013 0239   HDL 41 12/22/2013 0239   CHOLHDL 2.4 12/22/2013 0239   VLDL 27 12/22/2013 0239   LDLCALC 31 12/22/2013 0239   Lab Results  Component Value Date   HGBA1C 6.4* 12/21/2013   No results found for this basename: labopia,  cocainscrnur,  labbenz,  amphetmu,  thcu,  labbarb    No results found for this basename:  ETH,  in the last 168 hours  2-D echo 10/25/2013 Left ventricle: The cavity size was normal. Wall thickness was increased in a pattern of mild LVH. Systolic function was hyperdynamic. The estimated ejection fraction was in the range of 75% to 80%. Wall motion was normal; there were no regional wall motion abnormalities. There was an increased relative contribution of atrial contraction to ventricular filling. Doppler parameters are consistent with abnormal left ventricular relaxation (grade 1 diastolic dysfunction). Aortic valve: There was mild regurgitation. Left atrium: The atrium was mildly dilated.  Dg Chest 2 View 12/21/2013    Chronic lung disease is stable.  No superimposed acute abnormality.     Ct Head Wo Contrast 12/21/2013    Atrophy and chronic  microvascular ischemia.  No acute abnormality     Mr Brain Wo Contrast 12/21/2013  No acute or subacute infarction. Moderate chronic small-vessel ischemic changes throughout the brain as outlined above.       CUS - Bilateral: 1-39% ICA stenosis. Vertebral artery flow is antegrade.   PHYSICAL EXAM  Temp:  [97.8 F (36.6 C)-98.2 F (36.8 C)] 97.8 F (36.6 C) (10/18 1100) Pulse Rate:  [91-115] 109 (10/18 1300) Resp:  [13-23] 19 (10/18 1300) BP: (116-180)/(31-123) 160/59 mmHg (10/18 1300) SpO2:  [92 %-100 %] 93 % (10/18 1300) Weight:  [119 lb 14.9 oz (54.4 kg)] 119 lb 14.9 oz (54.4 kg) (10/18 0448)  General - Well nourished, well developed, in no apparent distress.  Ophthalmologic - not able to see through.  Cardiovascular - Regular rate and rhythm with no murmur.  Mental Status -  Awake alert, following all commands, orientated to place and people and year but not month. Language intact at expression and comprehension and naming and repetition.  Cranial Nerves II - XII - II - visual field intact OU. III, IV, VI - Extraocular movements intact. V - Facial sensation intact bilaterally. VII - nasolabial fold symmetrical. VIII - Hearing & vestibular intact bilaterally. X - intact. XI - Chin turning & shoulder shrug intact bilaterally. XII - Tongue protrusion attempt made.  Motor Strength - The patient's strength was 5-/5 UEs and 4/5 LEs and pronator drift was absent.  Bulk was normal and fasciculations were absent. Initially right foot weak on dorsi and plantar flexion but later showed 4/5 strength also.   Motor Tone - Muscle tone was assessed at the neck and appendages and was increased muscle tone LEs>UEs.  Reflexes - The patient's reflexes were 1+ in all extremities and she had no pathological reflexes.  Sensory - Light touch, temperature/pinprick testing were assessed and were normal.    Coordination - The patient had normal movements in the hands with no ataxia or  dysmetria.  Tremor was present at feet bilaterally.  Gait and Station - not tested.   ASSESSMENT/PLAN Ms. Victoria Lindsey is an 80 y.o. female with history of TIA, Parkinson's disease, multiple drug allergies, hypertension, hyperlipidemia, hypothyroidism, Sjogren's syndrome, valvular abnormalities, chronic kidney disease (stage III), and chronic anemia  Admitted for CP and SOB. But she stated that 2 days ago she had transient difficulty in getting words out and right arm felt funny, lasting about one hour and resolved. Similar symptoms years ago. Neurology consulted. For the last several weeks, her BP was high. In ER, SBP is at 200s. She did not check BP during the episode  TIA vs. Hypertensive encephalopathy - episodic language difficulty and vague right hand funny feeling can not ddx TIA or hypertensive encephalopathy.  MRI  No acute or subacute infarction.  MRA  Cancelled due to not tolerating the process  Carotid Doppler unremarkable  2D Echo  (10/25/2013) see above - no cardiac source of emboli  LDL 31 at the goal < 70  HgbA1c 6.4  SCDs for VTE prophylaxis  aspirin 81 mg orally every day and clopidogrel 75 mg orally every day prior to admission, now on aspirin 81 mg orally every day and clopidogrel 75 mg orally every day  Ongoing aggressive risk factor management  Resultant resolution of deficits  Therapy recommendations:  Pending  Disposition:  Pending  Due to the concerning of difficulty getting words out being potentially TIA with cardioembolic source, we recommend 30 day outpt cardiac event monitoring to evaluate for afib.  Hypertension  Home meds:   Metoprolol 75 mg twice a day; Aldactone 12.5 mg daily;   Stable  Avoid hypotension  BP goal normotensive  Hyperlipidemia  Home meds:  Crestor - resumed in hospital  LDL 31, at goal   Continue statin at discharge  Other Stroke Risk Factors Advanced age Hx TIA  Other Active Problems  Intubated -  pulmonary edema / question aspiration  Hyponatremia  Elevated liver function tests  Hospital day # 2  Neurology will sign off. Please call with questions. Pt will follow up with Dr. Erlinda Hong at [facility] in about 3 months. Thanks for the consult.  [name], MD PhD Stroke Neurology 12/23/2013 2:35 PM     To contact Stroke Continuity provider, please refer to [email or web address] After hours, contact General Neurology

## 2013-12-23 NOTE — Evaluation (Signed)
Physical Therapy Evaluation Patient Details Name: Victoria Lindsey MRN: [id number] DOB: May 14, 1925 Today's Date: 12/23/2013   History of Present Illness  80 y.o. female admitted to [facility] on 12/21/13 with trouble speaking and "R hand feeling funny" and increased SOB.  Stroke was suspected, but MRI negative.  Pt was though to have had a TIA.  During her hospital course she also developed severe hypoxic respiratory failure (per MD likely due to aspiration) and pt had to be intubated (12/21/13-12/22/13).  Pt with significant PMHx of HTN, Parkinson's disease, orthostatic hypotension, dizziness, anemia, tremor, Breast CA L breast, stage 3 kidney failure, pre diabetes, and h/o dysphasia (per daughter).    Clinical Impression  Pt is requiring two person physical assist to get from bed to [facility] with significant posterior lean (likely related to her parkinson's and anxiety).  She may be one of those patients who once you get a rollator in front of her and establish forward momentum she may mobilize well, but as of right now (today's assessment) she is very appropriate for inpatient rehab and has a very supportive family.  Her husband (per her daughter) will report he can physically help her at home and he does, but she has fallen and pulled him to the ground with her before when he was trying to help.   PT to follow acutely for deficits listed below.       Follow Up Recommendations CIR    Equipment Recommendations  None recommended by PT    Recommendations for Other Services Rehab consult     Precautions / Restrictions Precautions Precautions: Fall Precaution Comments: due to parkinson's      Mobility  Bed Mobility Overal bed mobility: Needs Assistance Bed Mobility: Supine to Sit;Sit to Supine     Supine to sit: Mod assist Sit to supine: Max assist   General bed mobility comments: Mod assist to support trunk during transition to sit.  Pt able to progress bil legs over EOB on her own. Max assist  to support trunk and legs to get pt back to supine in the bed.   Transfers Overall transfer level: Needs assistance Equipment used: 2 person hand held assist Transfers: Sit to/from [facility] Sit to Stand: +2 physical assistance;Mod assist Stand pivot transfers: +2 physical assistance;Mod assist       General transfer comment: Two person mod assist to stand and pivot to the [facility] safely.  Pt has tendancy towards posterior lean with legs extended in standing and would likely do better with RW (did not have one with me) to [place] forward momentum.    Ambulation/Gait             General Gait Details: Did not assess today. I will need a second person to attempt safely (daughter helping today, but would prefer a therapy tech).       Modified Rankin (Stroke Patients Only) Modified Rankin (Stroke Patients Only) Pre-Morbid Rankin Score: Moderate disability Modified Rankin: Moderately severe disability     Balance Overall balance assessment: Needs assistance Sitting-balance support: Feet supported;No upper extremity supported;Bilateral upper extremity supported Sitting balance-Leahy Scale: Poor Sitting balance - Comments: Min assist needed seated EOB to prevent posterior LOB.  Postural control: Posterior lean Standing balance support: Bilateral upper extremity supported Standing balance-Leahy Scale: Poor Standing balance comment: two person mod assist needed to stand at EOB due to posterior lean.  Pertinent Vitals/Pain Pain Assessment: Faces Faces Pain Scale: Hurts little more Pain Location: abdomen Pain Descriptors / Indicators: Discomfort Pain Intervention(s): Limited activity within patient's tolerance;Monitored during session;Repositioned;Other (comment) (got to [facility] and pt had a small BM)    Home Living Family/patient expects to be discharged to:: Private residence Living Arrangements: Spouse/significant  other Available Help at Discharge: Family;Available 24 hours/day (hsband can provide supervision) Type of Home: House Home Access: Stairs to enter Entrance Stairs-Rails:  (handle on the right side) Entrance Stairs-Number of Steps: 2 Home Layout: One level Home Equipment: Walker - 4 wheels;Bedside commode;Shower seat (BSC over the toilet, walk in shower (small)) Additional Comments: HHPT, HH SLP, RN, and aide are still active with [place]    Prior Function Level of Independence: Needs assistance         Comments: husband drives     Hand Dominance   Dominant Hand: Right (claims to do things left handed as well)    Extremity/Trunk Assessment   Upper Extremity Assessment: Defer to OT evaluation           Lower Extremity Assessment: RLE deficits/detail;LLE deficits/detail RLE Deficits / Details: right leg is generally weak in the 3+ to 4/5 range throughout.   LLE Deficits / Details: left leg is mildly weaker than right leg, but still in the 3+ to 4/5 range with seated MMT.   Cervical / Trunk Assessment: Kyphotic  Communication   Communication: No difficulties;Other (comment) (husband is HOH)  Cognition Arousal/Alertness: Awake/alert Behavior During Therapy: WFL for tasks assessed/performed Overall Cognitive Status: History of cognitive impairments - at baseline                      General Comments General comments (skin integrity, edema, etc.): Pt's RR, HR, and BP all were elevated during mobility.  RN made aware and some may be related to pt's increased anxiety and fear of falling as we were mobilizing as well. HR 132 max, 30 RR, O2 sats remained in the mid 90s on RA, BP is 177/63          Assessment/Plan    PT Assessment Patient needs continued PT services  PT Diagnosis Difficulty walking   PT Problem List Decreased strength;Decreased activity tolerance;Decreased balance;Decreased mobility;Decreased coordination;Decreased knowledge of use of DME;Decreased  knowledge of precautions;Cardiopulmonary status limiting activity;Pain  PT Treatment Interventions DME instruction;Gait training;Stair training;Functional mobility training;Therapeutic activities;Therapeutic exercise;Balance training;Neuromuscular re-education;Patient/family education;Modalities   PT Goals (Current goals can be found in the Care Plan section) Acute Rehab PT Goals Patient Stated Goal: to go home, get stronger PT Goal Formulation: With patient/family Time For Goal Achievement: 01/06/14 Potential to Achieve Goals: Good    Frequency Min 4X/week   Barriers to discharge Decreased caregiver support Pt lives with elderly husband who cannot provide physical assist at discharge, only close supervision.        End of Session Equipment Utilized During Treatment: Gait belt Activity Tolerance: Patient limited by fatigue;Other (comment) (limited by fear of falling and weakness) Patient left: in bed;with call bell/phone within reach;with family/visitor present Nurse Communication: Mobility status (and BP)         Time: 0932-6712 PT Time Calculation (min): 50 min   Charges:   PT Evaluation $Initial PT Evaluation Tier I: 1 Procedure PT Treatments $Therapeutic Activity: 23-37 mins        Camden Mazzaferro B. Safaa Stingley, PT, DPT [phone]   12/23/2013, 5:36 PM

## 2013-12-24 ENCOUNTER — Telehealth: Payer: Self-pay | Admitting: Neurology

## 2013-12-24 ENCOUNTER — Inpatient Hospital Stay (HOSPITAL_COMMUNITY): Payer: Medicare Other

## 2013-12-24 DIAGNOSIS — R740 Nonspecific elevation of levels of transaminase and lactic acid dehydrogenase [LDH]: Secondary | ICD-10-CM

## 2013-12-24 DIAGNOSIS — R7401 Elevation of levels of liver transaminase levels: Secondary | ICD-10-CM | POA: Diagnosis present

## 2013-12-24 LAB — BASIC METABOLIC PANEL
Anion gap: 11 (ref 5–15)
BUN: 31 mg/dL — AB (ref 6–23)
CALCIUM: 9.1 mg/dL (ref 8.4–10.5)
CO2: 26 mEq/L (ref 19–32)
Chloride: 99 mEq/L (ref 96–112)
Creatinine, Ser: 0.68 mg/dL (ref 0.50–1.10)
GFR calc Af Amer: 90 mL/min — ABNORMAL LOW (ref >90)
GFR, EST NON AFRICAN AMERICAN: 78 mL/min — AB (ref >90)
Glucose, Bld: 113 mg/dL — ABNORMAL HIGH (ref 70–99)
Potassium: 4 mEq/L (ref 3.7–5.3)
Sodium: 136 mEq/L — ABNORMAL LOW (ref 137–147)

## 2013-12-24 LAB — CBC
HCT: 32.4 % — ABNORMAL LOW (ref 36.0–46.0)
Hemoglobin: 10.6 g/dL — ABNORMAL LOW (ref 12.0–15.0)
MCH: 31.3 pg (ref 26.0–34.0)
MCHC: 32.7 g/dL (ref 30.0–36.0)
MCV: 95.6 fL (ref 78.0–100.0)
PLATELETS: 107 10*3/uL — AB (ref 150–400)
RBC: 3.39 MIL/uL — ABNORMAL LOW (ref 3.87–5.11)
RDW: 14.9 % (ref 11.5–15.5)
WBC: 5 10*3/uL (ref 4.0–10.5)

## 2013-12-24 LAB — BLOOD GAS, ARTERIAL
ACID-BASE DEFICIT: 3.6 mmol/L — AB (ref 0.0–2.0)
Bicarbonate: 24.5 mEq/L — ABNORMAL HIGH (ref 20.0–24.0)
Drawn by: 369891
FIO2: 1 %
O2 Saturation: 98.9 %
PCO2 ART: 77.3 mmHg — AB (ref 35.0–45.0)
Patient temperature: 98.6
TCO2: 26.9 mmol/L (ref 0–100)
pH, Arterial: 7.128 — CL (ref 7.350–7.450)
pO2, Arterial: 238 mmHg — ABNORMAL HIGH (ref 80.0–100.0)

## 2013-12-24 LAB — GLUCOSE, CAPILLARY: Glucose-Capillary: 211 mg/dL — ABNORMAL HIGH (ref 70–99)

## 2013-12-24 MED ORDER — SPIRONOLACTONE 12.5 MG HALF TABLET
12.5000 mg | ORAL_TABLET | Freq: Every day | ORAL | Status: DC
  Administered 2013-12-24 – 2013-12-25 (×2): 12.5 mg via ORAL
  Filled 2013-12-24 (×2): qty 1

## 2013-12-24 NOTE — Progress Notes (Signed)
I have seen the patient and reviewed the daily progress note by Jake Bathe MS 4 and discussed the care of the patient with them.  See below for documentation of my findings, assessment, and plans.  Subjective: Patient seen at bedside, feeling much better, notes that her legs feel weak after working with PT.  Daughter and husband updated at bedside. Objective: Vital signs in last 24 hours: Filed Vitals:   12/24/13 0955 12/24/13 1000 12/24/13 1143 12/24/13 1237  BP:  148/47  180/57  Pulse: 86 96  80  Temp:   98.1 F (36.7 C) 97.7 F (36.5 C)  TempSrc:   Oral Oral  Resp:  19  16  Height:    5' 1" (1.549 m)  Weight:      SpO2: 96% 97%  97%   Weight change:   Intake/Output Summary (Last 24 hours) at 12/24/13 1259 Last data filed at 12/24/13 0400  Gross per 24 hour  Intake    100 ml  Output    280 ml  Net   -180 ml   General: resting in chair HEENT:EOMI, o scleral icterusn Cardiac: RRR, no murmurs  Pulm: clear to auscultation bilaterally, moving normal volumes of air Abd: soft, nontender, nondistended, BS present Ext: warm and well perfused, no pedal edema Neuro: alert and oriented X3, bilateral patellar reflex 2+, lower extremity strength 5/5. Lab Results: Reviewed and documented in Electronic Record Micro Results: Reviewed and documented in Electronic Record Studies/Results: Reviewed and documented in Electronic Record Medications: I have reviewed the patient's current medications. Scheduled Meds: . aspirin EC  81 mg Oral Daily  . clopidogrel  75 mg Oral Daily  . docusate sodium  100 mg Oral Daily  . folic acid  1 mg Oral Daily  . levothyroxine  100 mcg Oral QAC breakfast  . metoprolol  75 mg Oral BID  . rasagiline  1 mg Oral Daily  . rosuvastatin  10 mg Oral q1800  . Rotigotine  8 mg/day Transdermal Daily  . sodium chloride  3 mL Intravenous Q12H  . spironolactone  12.5 mg Oral Daily   Continuous Infusions:  PRN Meds:.hydrALAZINE, metoprolol, RESOURCE  THICKENUP CLEAR Assessment/Plan:    TIA (transient ischemic attack) - Patient has completed workup by Neurology, did not obtain MRA as unlikely to change management and risks (recurrent aspiration) greater then benefit.  Her TSH is mildly elevated but she is taking synthroid. - Continue Antiplatelet therapy with 104m ASA and 752mplavix -Lipid panel shows LDL of 31 on Crestor 1066mw ill continue crestor 58m29mr now pending workup of elevated LFTs -Will need 30 day cardiac monitor on discharge - Continue monitoring telemetry until discharge. - PT recommends CIR however insurance unlikely to pay will attempt to arrange rehab at SNF[facility] resolved -Likely secondary to aspiration event in MRI but also possible flash pulmonary edema given her hypertension. - She has been weaned off oxygen completely and doing well.  She did have a recent echo which shows vigorous systolic function and grade 1 DD.  HTN - Fairly well controlled this AM. -On home metoprolol 75mg67m - Resume sprinoloactone 12.5mg d86my - PRN hydralazine.    Parkinson's disease - Continue rasagiline and rotigotine     Hypothyroidism -TSH 7 -Synthroid 100mcg 28mypercholesterolemia - Crestor 58mg   23mmocytic anemia and Thrombocytopenia   - Hgb stable, platelets improved. No evidence of microangiopathic hemolysis. - Follow up with outpatient hematologist.  Transaminitis - Alk phos and LFTs elevated, previous wnl (AST mildly elevated) -Acute hepatitis panel negative - Patient is on crestor but this is important given her TIA, and per daughter she has been on crestor for many years. -Will check RUQ U/S to rule out gall bladder etiology   Dispo: Disposition is deferred at this time, awaiting improvement of current medical problems.  Anticipated discharge in approximately 1 day(s).   The patient does have a current PCP Elba Barman, MD) and does not need an [facility] hospital follow-up  appointment after discharge.  The patient does not have transportation limitations that hinder transportation to clinic appointments.  .Services Needed at time of discharge: Y = Yes, Blank = No PT:   OT:   RN:   Equipment:   Other:     LOS: 3 days   Lucious Groves, DO 12/24/2013, 12:59 PM

## 2013-12-24 NOTE — Progress Notes (Signed)
Pt transferred with belongings, medications, and chart to room with new nurse at bedside. Attached to tele, no new problems at this time.

## 2013-12-24 NOTE — Progress Notes (Signed)
Rehab Admissions Coordinator Note:  Patient was screened by Retta Diones for appropriateness for an Inpatient Acute Rehab Consult.  At this time, we are recommending [facility].  Given current diagnosis and negative for CVA, it is unlikely that we could get authorization from [facility] for acute inpatient rehab admission.  Call me for questions.  Jodell Cipro M 12/24/2013, 10:02 AM  I can be reached at [phone].

## 2013-12-24 NOTE — Progress Notes (Signed)
Physical Therapy Treatment Patient Details Name: Victoria Lindsey MRN: [id number] DOB: 01/22/1926 Today's Date: 12/24/2013    History of Present Illness 80 y.o. female admitted to [facility] on 12/21/13 with trouble speaking and "R hand feeling funny" and increased SOB.  Stroke was suspected, but MRI negative.  Pt was though to have had a TIA.  During her hospital course she also developed severe hypoxic respiratory failure (per MD likely due to aspiration) and pt had to be intubated (12/21/13-12/22/13).  Pt with significant PMHx of HTN, Parkinson's disease, orthostatic hypotension, dizziness, anemia, tremor, Breast CA L breast, stage 3 kidney failure, pre diabetes, and h/o dysphasia (per daughter).      PT Comments    Pt progressing with mobility and able to ambulate today with assist for anterior translation. Pt fearful of falling anteriorly and performed sit to stand better after repeated trials of seated anterior translation. Pt educated for HEP, plan and weight shifting in sitting and standing to prevent falls. Dgtr present throughout. Will continue to follow.   Follow Up Recommendations  CIR     Equipment Recommendations  None recommended by PT    Recommendations for Other Services       Precautions / Restrictions Precautions Precautions: Fall Precaution Comments: posterior lean, parkinson's Restrictions Weight Bearing Restrictions: No    Mobility  Bed Mobility               General bed mobility comments: in recliner on arrival  Transfers Overall transfer level: Needs assistance   Transfers: Sit to/from Stand Sit to Stand: Mod assist;Min assist;+2 physical assistance         General transfer comment: initial stand pt with posterior lean, extending knees with max cues for safety, transfer and mobility with 2 person assist for safety. After ambulation had pt practice bending knees to chair with anterior weight shift x 5 then pt able to stand x 2 after with min  assist  Ambulation/Gait Ambulation/Gait assistance: Max assist;+2 safety/equipment Ambulation Distance (Feet): 50 Feet Assistive device: Rolling walker (2 wheeled) Gait Pattern/deviations: Shuffle;Narrow base of support;Leaning posteriorly   Gait velocity interpretation: Below normal speed for age/gender General Gait Details: pt initially max assist with P.T. providing anterior force from pt back and sacrum to initiate gait with max cues, RW and chair following during gt pt able to progress to mod assist with cues and assist at pelvis and trunk for anterior translation. Cues also for increased BOS and transition to increased toe out on LLE   Stairs            Wheelchair Mobility    Modified Rankin (Stroke Patients Only)       Balance     Sitting balance-Leahy Scale: Poor   Postural control: Posterior lean   Standing balance-Leahy Scale: Zero                      Cognition Arousal/Alertness: Awake/alert Behavior During Therapy: WFL for tasks assessed/performed Overall Cognitive Status: History of cognitive impairments - at baseline                      Exercises General Exercises - Lower Extremity Long Arc Quad: AROM;Seated;Both;15 reps Heel Slides: AROM;Seated;Both;15 reps Other Exercises Other Exercises: anterior trunk translation seated x 5 Other Exercises: sit to stand x 2 from chair    General Comments        Pertinent Vitals/Pain Pain Assessment: No/denies pain    Home Living  Prior Function            PT Goals (current goals can now be found in the care plan section) Progress towards PT goals: Progressing toward goals    Frequency       PT Plan Current plan remains appropriate    Co-evaluation             End of Session Equipment Utilized During Treatment: Gait belt Activity Tolerance: Patient tolerated treatment well Patient left: in chair;with call bell/phone within reach;with  family/visitor present;with nursing/sitter in room     Time: 2778-2423 PT Time Calculation (min): 26 min  Charges:  $Gait Training: 8-22 mins $Therapeutic Activity: 8-22 mins                    G Codes:      Melford Aase January 08, 2014, 10:00 AM Elwyn Reach, [place]

## 2013-12-24 NOTE — Progress Notes (Signed)
  Date: 12/24/2013  Patient name: Victoria Lindsey  Medical record number: [id number]  Date of birth: 1926/02/23   This patient has been seen and the plan of care was discussed with the house staff. Please see their note for complete details. I concur with their findings with the following additions/corrections:  Patient improved this AM, transfer to floor.  Continue to work with PT.  Likely discharge to Rehab in 1-2 days.  Agree with work up continuing for LFT elevation.   Sid Falcon, MD 12/24/2013, 2:50 PM

## 2013-12-24 NOTE — ED Provider Notes (Signed)
Medical screening examination/treatment/procedure(s) were conducted as a shared visit with non-physician practitioner(s) and myself.  I personally evaluated the patient during the encounter.   EKG Interpretation   Date/Time:  Friday December 21 2013 15:13:26 EDT Ventricular Rate:  72 PR Interval:  181 QRS Duration: 76 QT Interval:  468 QTC Calculation: 512 R Axis:   20 Text Interpretation:  Age not entered, assumed to be  80 years old for  purpose of ECG interpretation Sinus rhythm Probable left atrial  enlargement Anterior infarct, old Prolonged QT interval since last tracing  no significant change Confirmed by Shayleigh Bouldin  MD, Howard Bunte ([id number]) on  12/21/2013 4:23:19 PM      Pt with hx of TIA, had episode yesterday with inability to speak and weakness of right hand.  Feels back to baseline now, although possible slight weakness to right arm as compared to left on exam.  Will plan admission with stroke workup.  Malvin Johns, MD 12/24/13 [phone]

## 2013-12-24 NOTE — Progress Notes (Signed)
Subjective: Victoria Lindsey reports no issues with her breathing, no other complaints besides her legs feeling weak. Walked with physical therapy this morning.   Objective: Vital signs in last 24 hours: Filed Vitals:   12/24/13 0719 12/24/13 0800 12/24/13 0955 12/24/13 1000  BP:  142/51  148/47  Pulse:  90 86 96  Temp: 97.8 F (36.6 C)     TempSrc: Oral     Resp:  17  19  Height:      Weight:      SpO2:  96% 96% 97%   Weight change:   Intake/Output Summary (Last 24 hours) at 12/24/13 1043 Last data filed at 12/24/13 0400  Gross per 24 hour  Intake    340 ml  Output    325 ml  Net     15 ml   Physical Exam: General: Patient is thin-appearing, in no acute distress and cooperative with exam.  Cardiovascular: RRR, S1 normal, S2 normal, faint systolic murmur  Pulmonary/Chest: CTAB, respirations unlabored Abdominal: Soft, nontender, nondistended Neuro: Alert and oriented x3, EOMI, Strength and sensation intact throughout, CN 2-12 intact  Extremities: no edema Skin: Warm, dry and intact. No rashes or erythema. Psychiatric: Normal mood and affect. speech and behavior is normal. Cognition and memory are normal.   Lab Results: Results for orders placed during the hospital encounter of 12/21/13 (from the past 24 hour(s))  GLUCOSE, CAPILLARY     Status: None   Collection Time    12/23/13 12:03 PM      Result Value Ref Range   Glucose-Capillary 94  70 - 99 mg/dL   Comment 1 Notify RN    GLUCOSE, CAPILLARY     Status: Abnormal   Collection Time    12/23/13  4:38 PM      Result Value Ref Range   Glucose-Capillary 211 (*) 70 - 99 mg/dL   Comment 1 Notify RN    BASIC METABOLIC PANEL     Status: Abnormal   Collection Time    12/24/13  3:09 AM      Result Value Ref Range   Sodium 136 (*) 137 - 147 mEq/L   Potassium 4.0  3.7 - 5.3 mEq/L   Chloride 99  96 - 112 mEq/L   CO2 26  19 - 32 mEq/L   Glucose, Bld 113 (*) 70 - 99 mg/dL   BUN 31 (*) 6 - 23 mg/dL   Creatinine, Ser 0.68  0.50  - 1.10 mg/dL   Calcium 9.1  8.4 - 10.5 mg/dL   GFR calc non Af Amer 78 (*) >90 mL/min   GFR calc Af Amer 90 (*) >90 mL/min   Anion gap 11  5 - 15  CBC     Status: Abnormal   Collection Time    12/24/13  3:09 AM      Result Value Ref Range   WBC 5.0  4.0 - 10.5 K/uL   RBC 3.39 (*) 3.87 - 5.11 MIL/uL   Hemoglobin 10.6 (*) 12.0 - 15.0 g/dL   HCT 32.4 (*) 36.0 - 46.0 %   MCV 95.6  78.0 - 100.0 fL   MCH 31.3  26.0 - 34.0 pg   MCHC 32.7  30.0 - 36.0 g/dL   RDW 14.9  11.5 - 15.5 %   Platelets 107 (*) 150 - 400 K/uL   Micro Results: No results found for this or any previous visit (from the past 240 hour(s)). Studies/Results: No results found. Medications: I have reviewed the  patient's current medications. Scheduled Meds: . aspirin EC  81 mg Oral Daily  . clopidogrel  75 mg Oral Daily  . docusate sodium  100 mg Oral Daily  . folic acid  1 mg Oral Daily  . levothyroxine  100 mcg Oral QAC breakfast  . metoprolol  75 mg Oral BID  . rasagiline  1 mg Oral Daily  . rosuvastatin  10 mg Oral q1800  . Rotigotine  8 mg/day Transdermal Daily  . sodium chloride  3 mL Intravenous Q12H   Continuous Infusions:  PRN Meds:.hydrALAZINE, metoprolol, RESOURCE THICKENUP CLEAR Assessment/Plan: Principal Problem:   TIA (transient ischemic attack) Active Problems:   Benign hypertensive heart disease without heart failure   Parkinson's disease   Hypothyroidism   Hypercholesterolemia   Sjogren's syndrome   Dyspnea   Normocytic anemia   Thrombocytopenia   Respiratory insufficiency  Acute respiratory failure - resolved. Extubated 10/17. Patient sating 100% on room air, lungs CTAB. Likely secondary to aspiration pneumonitis vs. Flash pulmonary edema.  -Dysphagia 1 diet per speech therapy to prevent aspiration  TIA (transient ischemic attack): Patient presented with 1 hr of slurred speech and R hand tingling, now resolved. CT, MRI negative. -MRA cancelled as patient did not tolerate MRI  - Carotid  dopplers 0-40% stenosis - A1c and Lipid panel normal - Continue ASA and Plavix for antiplatelet therapy.  - continue PT, SNF at discharge  HTN: Has been well controlled, 1 reading of 189/54 last night that normalized with hydralazine - Metoprolol 29m BID - resume home spirinolactone 12.567mdaily -Hydralazine PRN for SBP >170 mmHg  Parkinson's disease  - Continue rasagiline and rotigotine   Hypothyroidism: TSH 7  -Continue home synthroid  -f/u as outpatient  Hypercholesterolemia  - Continue crestor   Sjogren's syndrome  -Stable   Normocytic anemia and Thrombocytopenia: Hgb stable at 10.6, platelet count improved to 107 from 84. No abnormalities on anemia panel. -F/u with outpatient hemotologist.   Elevated Alk Phos/ALT/AST: Hepatitis panel wnl - RUQ U/S   This is a [employer] information officerote.  The care of the patient was discussed with Dr. HoHeber Carolinand the assessment and plan formulated with their assistance.  Please see their attached note for official documentation of the daily encounter.   LOS: 3 days   Victoria BackerMed Student 12/24/2013, 10:43 AM

## 2013-12-24 NOTE — Telephone Encounter (Signed)
Ivin Booty with [facility] is calling regarding a referral for a heart moniter for patient.. Patient is in Intensive Care and this procedure cannot be done at this time. The order will be kept for 2 months until patient is out of the hospital.

## 2013-12-25 DIAGNOSIS — M3500 Sicca syndrome, unspecified: Secondary | ICD-10-CM

## 2013-12-25 DIAGNOSIS — G20 Parkinson's disease: Secondary | ICD-10-CM

## 2013-12-25 LAB — COMPREHENSIVE METABOLIC PANEL
ALBUMIN: 3.2 g/dL — AB (ref 3.5–5.2)
ALT: 51 U/L — ABNORMAL HIGH (ref 0–35)
AST: 67 U/L — AB (ref 0–37)
Alkaline Phosphatase: 128 U/L — ABNORMAL HIGH (ref 39–117)
Anion gap: 12 (ref 5–15)
BUN: 31 mg/dL — ABNORMAL HIGH (ref 6–23)
CALCIUM: 9.2 mg/dL (ref 8.4–10.5)
CO2: 26 mEq/L (ref 19–32)
CREATININE: 0.63 mg/dL (ref 0.50–1.10)
Chloride: 101 mEq/L (ref 96–112)
GFR calc Af Amer: >90 mL/min (ref >90)
GFR, EST NON AFRICAN AMERICAN: 80 mL/min — AB (ref >90)
Glucose, Bld: 134 mg/dL — ABNORMAL HIGH (ref 70–99)
Potassium: 4 mEq/L (ref 3.7–5.3)
Sodium: 139 mEq/L (ref 137–147)
TOTAL PROTEIN: 6.8 g/dL (ref 6.0–8.3)
Total Bilirubin: 0.5 mg/dL (ref 0.3–1.2)

## 2013-12-25 MED ORDER — SPIRONOLACTONE 25 MG PO TABS
12.5000 mg | ORAL_TABLET | Freq: Once | ORAL | Status: AC
  Administered 2013-12-25: 12.5 mg via ORAL
  Filled 2013-12-25: qty 1

## 2013-12-25 MED ORDER — ROTIGOTINE 4 MG/24HR TD PT24
2.0000 | MEDICATED_PATCH | Freq: Every day | TRANSDERMAL | Status: DC
  Administered 2013-12-25 – 2013-12-27 (×3): 2 via TRANSDERMAL
  Filled 2013-12-25 (×3): qty 2

## 2013-12-25 MED ORDER — SPIRONOLACTONE 25 MG PO TABS
25.0000 mg | ORAL_TABLET | Freq: Every day | ORAL | Status: DC
  Administered 2013-12-26 – 2013-12-27 (×2): 25 mg via ORAL
  Filled 2013-12-25 (×2): qty 1

## 2013-12-25 NOTE — Progress Notes (Signed)
Occupational Therapy Evaluation Patient Details Name: Victoria Lindsey MRN: [id number] DOB: 1925-08-12 Today's Date: 12/25/2013    History of Present Illness 80 y.o. female admitted to [facility] on 12/21/13 with trouble speaking and "R hand feeling funny" and increased SOB.  Stroke was suspected, but MRI negative.  Pt was though to have had a TIA.  During her hospital course she also developed severe hypoxic respiratory failure (per MD likely due to aspiration) and pt had to be intubated (12/21/13-12/22/13).  Pt with significant PMHx of HTN, Parkinson's disease, orthostatic hypotension, dizziness, anemia, tremor, Breast CA L breast, stage 3 kidney failure, pre diabetes, and h/o dysphasia (per daughter).     Clinical Impression   PTA, pt lived at home with husband, who has assisted with LB ADL as needed since recent fall resulting in L1 compression fx. Until that time, pt was mod I with mobility and ADL. Pt presents with significant functional decline, requiring +2 assistance with mobility and Max a with ADL, and will benefit from CIR to maximize functional level of independence with ADL and mobility to facilitate D/C home with supportive family who can provide 24/7 care. Will follow acutely to address established goals.     Follow Up Recommendations  CIR;Supervision/Assistance - 24 hour    Equipment Recommendations  None recommended by OT    Recommendations for Other Services Rehab consult     Precautions / Restrictions Precautions Precautions: Fall Precaution Comments: posterior lean, parkinson's      Mobility Bed Mobility               General bed mobility comments: in recliner on arrival  Transfers Overall transfer level: Needs assistance Equipment used: 1 person hand held assist Transfers: Sit to/from Stand Sit to Stand: Max assist         General transfer comment: significant posterior lean Pt unaware of midline. "i feellike I'm standing straight"    Balance Overall  balance assessment: Needs assistance Sitting-balance support: Feet supported;Bilateral upper extremity supported Sitting balance-Leahy Scale: Fair Sitting balance - Comments: edge of chair with BUE supported. kyphotic posture     Standing balance-Leahy Scale: Zero Standing balance comment: significant posteriorlean                            ADL Overall ADL's : Needs assistance/impaired Eating/Feeding: Supervision/ safety;Set up   Grooming: Set up;Supervision/safety;Sitting (supported)   Upper Body Bathing: Minimal assitance;Sitting   Lower Body Bathing: Maximal assistance;Sit to/from stand   Upper Body Dressing : Moderate assistance;Sitting   Lower Body Dressing: Maximal assistance;Sit to/from stand   Toilet Transfer: Stand-pivot;Maximal assistance   Toileting- Water quality scientist and Hygiene: Moderate assistance;Sitting/lateral lean       Functional mobility during ADLs: Maximal assistance (transfers only) General ADL Comments: significant decline in function     Vision                     Perception     Praxis      Pertinent Vitals/Pain Pain Assessment: 0-10 Pain Score: 4  Pain Location: back Pain Descriptors / Indicators: Discomfort Pain Intervention(s): Monitored during session;Repositioned     Hand Dominance Right   Extremity/Trunk Assessment Upper Extremity Assessment Upper Extremity Assessment: Generalized weakness   Lower Extremity Assessment Lower Extremity Assessment: Defer to PT evaluation   Cervical / Trunk Assessment Cervical / Trunk Assessment: Kyphotic   Communication Communication Communication: No difficulties   Cognition Arousal/Alertness: Awake/alert Behavior During Therapy:  WFL for tasks assessed/performed Overall Cognitive Status: History of cognitive impairments - at baseline                     General Comments   Pt very motivated to return to [facility]    Exercises   Other Exercises Other  Exercises: facilitating anterior weight shift with sit - stand   Shoulder Instructions      Home Living Family/patient expects to be discharged to:: Inpatient rehab                                        Prior Functioning/Environment Level of Independence: Needs assistance  Gait / Transfers Assistance Needed: mod I ADL's / Homemaking Assistance Needed: husband assisted with LB ADL s/p fall due to compression fx Communication / Swallowing Assistance Needed: dys 3; nectar      OT Diagnosis: Generalized weakness;Acute pain   OT Problem List: Decreased strength;Decreased range of motion;Decreased activity tolerance;Impaired balance (sitting and/or standing);Decreased coordination;Decreased safety awareness;Decreased knowledge of use of DME or AE;Decreased knowledge of precautions;Cardiopulmonary status limiting activity;Pain   OT Treatment/Interventions: Self-care/ADL training;Therapeutic exercise;Neuromuscular education;DME and/or AE instruction;Therapeutic activities;Patient/family education;Balance training    OT Goals(Current goals can be found in the care plan section) Acute Rehab OT Goals Patient Stated Goal: to go home, get stronger OT Goal Formulation: With patient/family Time For Goal Achievement: 01/08/14 Potential to Achieve Goals: Good  OT Frequency: Min 3X/week   Barriers to D/C:            Co-evaluation              End of Session Equipment Utilized During Treatment: Gait belt;Rolling walker Nurse Communication: Mobility status  Activity Tolerance: Patient tolerated treatment well Patient left: in chair;with call bell/phone within reach;with chair alarm set;with family/visitor present   Time: 5374-8270 OT Time Calculation (min): 24 min Charges:  OT General Charges $OT Visit: 1 Procedure OT Evaluation $Initial OT Evaluation Tier I: 1 Procedure OT Treatments $Self Care/Home Management : 8-22 mins G-Codes:    Darren Caldron,HILLARY 30-Dec-2013,  3:39 PM   [facility], OTR/L  [phone] 12-30-13

## 2013-12-25 NOTE — Discharge Summary (Signed)
Name: Victoria Lindsey MRN: [id number] DOB: Jun 17, 1925 80 y.o. PCP: Elba Barman, MD  Date of Admission: 12/21/2013  2:20 PM Date of Discharge: 12/27/2013 Attending Physician: Sid Falcon, MD  Discharge Diagnosis: Principal Problem:   TIA (transient ischemic attack) Active Problems:   Benign hypertensive heart disease without heart failure   Parkinson's disease   Hypothyroidism   Hypercholesterolemia   Sjogren's syndrome   Dyspnea   Normocytic anemia   Thrombocytopenia   Respiratory insufficiency   Transaminitis  Discharge Medications:   Medication List         acetaminophen 500 MG tablet  Commonly known as:  TYLENOL  Take 500 mg by mouth every 6 (six) hours as needed (pain).     ALIGN PO  Take 1 capsule by mouth every evening.     antiseptic oral rinse Liqd  15 mLs by Mouth Rinse route as needed for dry mouth.     aspirin 81 MG tablet  Take 81 mg by mouth every evening.     AZILECT 1 MG Tabs tablet  Generic drug:  rasagiline  Take 1 mg by mouth daily.     BIOTIN PO  Take 1,000 mcg by mouth daily.     clopidogrel 75 MG tablet  Commonly known as:  PLAVIX  Take 75 mg by mouth every evening.     Cranberry 500 MG Caps  Take 500 mg by mouth daily.     CRESTOR 10 MG tablet  Generic drug:  rosuvastatin  Take 10 mg by mouth every evening.     docusate sodium 100 MG capsule  Commonly known as:  COLACE  Take 100 mg by mouth daily.     FOLIC ACID PO  Take 0.6 mg by mouth daily.     hydroxypropyl methylcellulose / hypromellose 2.5 % ophthalmic solution  Commonly known as:  ISOPTO TEARS / GONIOVISC  Place 1 drop into both eyes 2 (two) times daily.     KEFLEX 250 MG capsule  Generic drug:  cephALEXin  Take 250 mg by mouth at bedtime. Take 1 at bedtime.     levothyroxine 100 MCG tablet  Commonly known as:  SYNTHROID, LEVOTHROID  Take 100 mcg by mouth daily.     metoprolol 50 MG tablet  Commonly known as:  LOPRESSOR  Take 1.5 tablets (75 mg  total) by mouth 2 (two) times daily.     multivitamin with minerals Tabs tablet  Take 1 tablet by mouth daily.     Rotigotine 8 MG/24HR Pt24  Place 1 patch onto the skin daily.     spironolactone 25 MG tablet  Commonly known as:  ALDACTONE  Take 1 tablet (25 mg total) by mouth daily.     Vitamin D3 2000 UNITS Tabs  Take 1 tablet by mouth daily.        Disposition and follow-up:   Victoria Lindsey was discharged from [facility] in Stable condition.  At the hospital follow up visit please address:  1.  HTN (spironolactone increased to $RemoveBefo'25mg'PUtotzMebxY$  daily), Normocytic anemia with thrombocytopenia (Hgb 10.1, Platelets 126k on discharge), Hypothyroidism (TSH 7), Strength and progress with physical therapy/rehab  2.  Labs / imaging needed at time of follow-up: BMP, CBC  3.  Pending labs/ test needing follow-up: none  Follow-up Appointments: Follow-up Information   Follow up with Elba Barman, MD. Schedule an appointment as soon as possible for a visit in 1 week. (for hospital follow up)    Specialty:  Family Medicine   Contact information:   [place] [address] [phone]       Follow up with [name], MD. Schedule an appointment as soon as possible for a visit in 2 weeks.   Specialty:  Cardiology   Contact information:   [address] [place] [address] [phone]       Follow up with Xu,Jindong, MD. Schedule an appointment as soon as possible for a visit in 2 months. (Neurology follow up)    Specialty:  Neurology   Contact information:   [address] [place] [address] [phone]       Discharge Instructions: Discharge Instructions   Call MD for:  difficulty breathing, headache or visual disturbances    Complete by:  As directed      Call MD for:  persistant dizziness or light-headedness    Complete by:  As directed      Call MD for:  temperature >100.4    Complete by:  As directed        Increase activity slowly    Complete by:  As directed            Consultations: Neurology, Pulmonary/Critical Care  Procedures Performed:  Dg Chest 2 View  12/21/2013   CLINICAL DATA:  Hypertension.  Headache and dizziness  EXAM: CHEST  2 VIEW  COMPARISON:  11/21/2013  FINDINGS: Cardiac enlargement without heart failure. Elevated left hemidiaphragm and left lower lobe atelectasis/scarring unchanged. Negative for pneumonia. Negative for pleural effusion  Moderate to severe chronic compression fracture approximately L1 unchanged from the prior study.  IMPRESSION: Chronic lung disease is stable.  No superimposed acute abnormality.   Electronically Signed   By: Franchot Gallo M.D.   On: 12/21/2013 14:26   Ct Head Wo Contrast  12/21/2013   CLINICAL DATA:  Parkinson's.  Recent fall.  Fell and hit head today  EXAM: CT HEAD WITHOUT CONTRAST  TECHNIQUE: Contiguous axial images were obtained from the base of the skull through the vertex without intravenous contrast.  COMPARISON:  MRI 04/12/2012  FINDINGS: Generalized atrophy. Mild chronic microvascular ischemic change in the white matter.  Negative for acute infarct.  Negative for hemorrhage or mass.  Negative for skull fracture. Atherosclerotic disease in the cavernous carotid bilaterally. Mucosal thickening left sphenoid sinus.  IMPRESSION: Atrophy and chronic microvascular ischemia.  No acute abnormality   Electronically Signed   By: Franchot Gallo M.D.   On: 12/21/2013 15:48   Ct Angio Chest W/cm &/or Wo Cm  12/04/2013   CLINICAL DATA:  Increased shortness of breath. Fall with back fractures in July. History of breast cancer 5 years ago.  EXAM: CT ANGIOGRAPHY CHEST WITH CONTRAST  TECHNIQUE: Multidetector CT imaging of the chest was performed using the standard protocol during bolus administration of intravenous contrast. Multiplanar CT image reconstructions and MIPs were obtained to evaluate the vascular anatomy.  CONTRAST:  59mL OMNIPAQUE IOHEXOL  350 MG/ML SOLN  COMPARISON:  11/21/2013 chest radiograph  FINDINGS: Breathing motion artifact obscures the lung bases.  Subject to this mild limitation, no filling defect is identified in the pulmonary arterial tree to suggest pulmonary embolus.  Thoracic aortic atherosclerotic calcification noted with some mural thrombus along the descending thoracic aorta. There is likely some degree of coronary atherosclerosis. Mildly enlarged cardiopericardial silhouette. Interventricular septal thickness 1.5 cm, borderline for left ventricular hypertrophy.  Cystic lesion in the left breast 2.8 by 3.5 cm, image 46 series 4. Adjacent  pleural thickening and subpleural interstitial accentuation suggesting radiation therapy in the region.  Subacute left anterior third and fourth rib fractures could be posttraumatic or due to the localized radiation therapy causing weakening in the bone. Mild adjacent pleural thickening noted.  Thoracic spondylosis. Slight superior endplate concavities at T2 and T3 are age indeterminate.  Mild mosaic attenuation in the lungs with equal visibility of vessels in the darker regions. Bilateral airway thickening in the lower lobes.  Review of the MIP images confirms the above findings.  IMPRESSION: 1. No pulmonary embolus is identified. 2. Subacute left anterior third and fourth rib fractures. This is in the vicinity of localized radiation therapy, which may have predispose to fracture. Mild adjacent pleural thickening. 3. Cystic lesion in the left breast 2.8 by 3.5 cm, probably post procedural. 4. Mild superior endplate concavities at T2 and T3, age indeterminate, likely subtle superior endplate compression fractures. 5. Mosaic attenuation in the lungs may be from low-grade alveolar infiltration from edema, or air trapping. There is notable airway thickening in both lower lobes. 6. Atherosclerosis.   Electronically Signed   By: Sherryl Barters M.D.   On: 12/04/2013 15:56   Mr Brain Wo  Contrast  12/21/2013   CLINICAL DATA:  Episode of speech disturbance and right-sided weakness.  EXAM: MRI HEAD WITHOUT CONTRAST  TECHNIQUE: Multiplanar, multiecho pulse sequences of the brain and surrounding structures were obtained without intravenous contrast.  COMPARISON:  Head CT same day.  MRI 04/12/2012.  FINDINGS: Diffusion imaging does not show any acute or subacute infarction. The brainstem is normal. There are a few old small vessel cerebellar infarctions. There are a few old small vessel infarctions affecting the thalami basal ganglia. There are moderate chronic small-vessel ischemic changes throughout the deep and subcortical white matter. No cortical or large vessel territory infarction. No mass lesion, hemorrhage, hydrocephalus or extra-axial collection. There is arachnoid herniation into the sella but no pituitary mass. No inflammatory sinus disease. No skull or skullbase lesion.  IMPRESSION: No acute or subacute infarction. Moderate chronic small-vessel ischemic changes throughout the brain as outlined above.   Electronically Signed   By: Nelson Chimes M.D.   On: 12/21/2013 18:44   Dg Chest Port 1 View  12/21/2013   CLINICAL DATA:  Status post intubation.  EXAM: PORTABLE CHEST - 1 VIEW  COMPARISON:  Single view of the chest 12/21/2013 at 1922 hr.  FINDINGS: The patient has a new endotracheal tube in place with the tip in good position at the level of the clavicular heads. NG tube is also in good position with the tip in the stomach. The chest is better expanded with decreased atelectasis. Patchy bilateral airspace disease likely due to edema again seen. Cardiomegaly.  IMPRESSION: ET tube and NG tube project in good position.  Decreased atelectasis with pulmonary edema again seen.   Electronically Signed   By: Inge Rise M.D.   On: 12/21/2013 21:49   Dg Chest Port 1 View  12/21/2013   CLINICAL DATA:  Acute hypoxia.  EXAM: PORTABLE CHEST - 1 VIEW  COMPARISON:  PA and lateral chest  earlier this same day.  FINDINGS: Since the earlier study, extensive bilateral airspace disease has developed. Heart size is enlarged. There is small left pleural effusion. No pneumothorax.  IMPRESSION: New, extensive bilateral airspace disease is likely due to pulmonary edema.   Electronically Signed   By: Inge Rise M.D.   On: 12/21/2013 19:59   Dg Chest Port 1v Same Day  12/21/2013  CLINICAL DATA:  Status post repositioning of endotracheal tube.  EXAM: PORTABLE CHEST - 1 VIEW SAME DAY  COMPARISON:  Single view of the chest 12/21/2013 at 2133 hr  FINDINGS: ET tube is in place with the tip in good position at the inferior aspect of the clavicular heads. NG tube tip is in the stomach. Patchy bilateral airspace disease and cardiomegaly appear unchanged.  IMPRESSION: ET tube projects in good position.  No change in pulmonary edema.   Electronically Signed   By: Inge Rise M.D.   On: 12/21/2013 22:13   US Abdomen Limited Ruq  12/24/2013   CLINICAL DATA:  80 year old female with history of high blood pressure, breast cancer, pre diabetes, post cholecystectomy now with elevated transaminases. Initial encounter.  EXAM: US ABDOMEN LIMITED - RIGHT UPPER QUADRANT  COMPARISON:  None.  FINDINGS: Gallbladder:  Surgically removed.  Common bile duct:  Diameter: 4.6 mm.  Liver:  Heterogeneous echotexture which may represent fatty infiltration without focal mass or intrahepatic biliary duct dilation.  IMPRESSION: Post cholecystectomy.  No evidence of common bile duct or intrahepatic biliary duct dilation.  Heterogeneous echotexture liver without focal mass. This may represent result of fatty infiltration   Electronically Signed   By: Chauncey Cruel M.D.   On: 12/24/2013 15:55   Admission HPI: Victoria Lindsey is a 80 yo F with PMH of parkinson's disease, sjogren's syndrome, HTN, hypothyroidism, chronic anemia who presents to [facility] accompanied by her daughter and husband. Her daughter reports that she has been  receiving home health therapy after a fall a few weeks ago and the therapist noted her SBP was >200, and it is usually not that high, this made her concerned because her mother and father had said that Victoria Lindsey had an episode of not being able to find the right words and some slurred speech yesterday that resolved after 1 hour. She also did note some "weird" feeling during this time of her right hand (that was not weakness or numbness). Initially they were not concerned as her symptoms resolved quickly but they became concerned after learning about the high blood pressure.She does report one episode 8-10 years ago that was similar likely was after this she was started on plavix. The daughter is also concerned about her breathing as she has had some increased dyspnea over the past several weeks although this has been an ongoing problem for the past 2 years. They have seen Dr. Gwenette Greet (pulmonology) but has only heard that she has an elevated left hemidiaphragm.   Of note patient fell in July> compression fracture in L1, broken 2 ribs, fell going to bathroom they attribute this to balance issues from [place] by problem list: Principal Problem:   TIA (transient ischemic attack) Active Problems:   Benign hypertensive heart disease without heart failure   Parkinson's disease   Hypothyroidism   Hypercholesterolemia   Sjogren's syndrome   Dyspnea   Normocytic anemia   Thrombocytopenia   Respiratory insufficiency   Transaminitis   1. Acute respiratory failure: In the ED, the patient reported chronic dyspnea that had been worked up extensively by her pulmonologist. She had spirometry testing as a outpatient, which was unremarkable. She also had a chest CT which showed no abnormalities other than an elevated L hemidiaphragm without paralysis. At the time of admission, she reported subjective SOB with audible high-pitched wheezing, although only few wheezes were heard on lung exam. Her O2  saturation 100% on 2L [place]. After seeing the patient  in the ED, she went for MRI and upon arrival to the floor was noted to be acutely dyspneic with increased WOB. Her O2 sat at that time was 40%, and a rapid response was called. At that time, she was noted to have accessory muscle use and new bilateral rales. She was alert and conscience reported that she felt like she may have had some reflux in the MRI. She was placed on a NRB and an ABG obtained showed an acute respiratory acidosis. A STAT CXR showed new bilateral pulmonary edema. Per respiratory therapy, patient had no gag reflex on suctioning. Her acute respiratory failure was suspected to be due to aspiration versus flash pulmonary edema. She was given 40mg  of IV lasix. Critical Care was consulted and the patient was transferred to the ICU. She developed altered mental status shortly after, and was thus intubated. She was started on meropenem for possible aspiration pneumonitis. The following day, she was extubated without complication. Her O2 saturation remained at 97-100% on room air for the remainder of her hospital stay and she did not require any supplemental oxygen. Our leading suspicion was that this was caused by aspiration although it is also plausible her hypertension lead to acute pulmonary edema from acute CHF.  2. TIA (transient ischemic attack): Patient presented with a 1hr history of trouble speaking and abnormal sensation in her R hand the day prior to admission, most consistent with a TIA. Patient has a prior history of TIAs and was taking Aspirin and Plavix at home. Head CT and Brain MRI were both negative for acute stroke. She had a recent ECHO 10/2013 which showed LV EF 75-80% and no other abnormalities. Carotid dopplers were performed, which showed 1-39% stenosis bilaterally. HgbA1C was 6.4% and LDL was 31. She was evaluated by speech therapy after a failed bedside swallow screen, and was placed on a dysphagia diet with nectar thick  liquids (which she had also been on at home). She was continued on her home ASA and Plavix for antiplatelet therapy and her home Crestor. She was also seen by Neurology, who recommended a 30 day cardiac event monitor after discharge.  A repeat MBS was requested by SLP and was competed on day of discharge which showed mild pharyngeal phase dysphagia but recommended a regular diet with thin liquids as well as compensation measures.  She will follow up with Dr. Erlinda Hong as an outpatient.  She will also need a 30 day cardiac monitor which has been ordered to be placed at cardiologist office.  Of note an MRA was considered to revaluate her cerebral arteries but was not pursued given her likely aspiration during previous MRI.  3. HTN: Patient has a history of essential HTN and reported that her BP had been running high for a couple weeks prior to presentation. At home, she was taking metoprolol 75mg  BID and Spironolactone 12.5mg  daily. In the ED, her pressure was elevated to 212/95. Her antihypertensives were held at the time of admission for 24 hrs to allow permissive HTN in the setting of presumed TIA. On hospital day 2 she was started on metoprolol PRN and hydralazine PRN while in the ICU to control pressures. After being transferred out of the ICU, she was restarted on her home metoprolol 75mg  BID and spironolactone 12.5mg  daily. Her pressures remained elevated with systolics in the 295A-213Y and she continued to receive hydralazine PRN. Her spironolactone was increase to 25mg  daily with improvement of her BP to 141/47 on discharge.  4. Parkinson's disease:  Patient was continued on her home medications including rasagiline and rotigotine   5. Hypothyroidism: Patient has a history of hypothryoidism and was on Synthroid 131mcg daily prior to admission. Her TSH was found to be 7 and she was continued on her home synthroid.   6. Hypercholesterolemia: Patient was on Crestor prior to admission. A lipid panel was checked,  with LDL 31, HDL 41, and total cholesterol 99. She was continued on her Crestor $RemoveBef'10mg'TsEGHLkWFX$  during her hospitalization and at discharge.   7. Sjogren's syndrome: Patient has a history of Sjogren's syndrome, which has been stable for many years. She did not have any complaints regarding this while hospitalized.    8. Normocytic anemia with new Thrombocytopenia: On admission labs, patient was found to have Hgb 11.1 and platelet count 85. Per chart review, her baseline Hgb is around 11 and she previously had a macrocytic anemia in 2013. She was also taking folic acid as a home medication. According to our records, patient had normal platelet count 2 months ago, however her  her daughter reports she recently was found to have a platelet count of 86k and was referred to a hematologist. A full anemia panel was ordered including iron, %saturation, folate, B12, reticulocyte count which were all normal. TIBC was slightly decreased at 219. LDH (mildly elevated but drawn after acute respiratory distress due to pulmonary edema)  and Haptoglobin and was within normal limits, indicating low likelihood of microangiopathic autoimmune hemolytic anemia. A peripheral smear showed large platelets and polychromasia. She will follow up as outpatient with her hematologist.  8. Transaminitis: On admission, patient was found to have AST 60, ALT 61, and Alk phos 146. On chart review, on AST was mildly elevated in 2013, but her other liver enzymes were normal. Hepatitis A, B, and C antibodies/ serologies were check and were negative/ nonreactive. HIV Ab was also nonreactive. RUQ U/S was performed which showed heterogenous liver echotexture suggestive of likely fatty infiltrate in the liver.   Given that she reports compliance with crestor for many years and admitted for a TIA we felt that crestor was not the likely cause of her Transaminitis and recommend this be continued.  She will follow up with her PCP.  Discharge Vitals:   BP 141/47   Pulse 70  Temp(Src) 98.4 F (36.9 C) (Oral)  Resp 18  Ht $R'5\' 1"'qb$  (1.549 m)  Wt 120 lb 9.5 oz (54.7 kg)  BMI 22.80 kg/m2  SpO2 94%  Discharge Labs:  Results for orders placed during the hospital encounter of 12/21/13 (from the past 24 hour(s))  TROPONIN I     Status: None   Collection Time    12/26/13 10:39 PM      Result Value Ref Range   Troponin I <0.30  <0.30 ng/mL  TROPONIN I     Status: None   Collection Time    12/27/13  3:21 AM      Result Value Ref Range   Troponin I <0.30  <0.30 ng/mL  TROPONIN I     Status: None   Collection Time    12/27/13 12:10 PM      Result Value Ref Range   Troponin I <0.30  <0.30 ng/mL    Signed: Lucious Groves, DO 12/27/2013, 1:06 PM   Services Ordered on Discharge: none Equipment Ordered on Discharge: none

## 2013-12-25 NOTE — Progress Notes (Signed)
I met with pt, daughter and spouse at bedside. We discussed a possible admission to inpt rehab pending insurance approval which I will initiate today. We also discussed privately paying for an inpt rehab stay as well as SNF rehab as other options. They will discuss backup plans for inpt rehab. I will follow up as soon as insurance makes their decision. RN CM is aware. [phone]

## 2013-12-25 NOTE — Consult Note (Signed)
Physical Medicine and Rehabilitation Consult Reason for Consult: TIA/acute respiratory failure Referring Physician: [facility]   HPI: Victoria Lindsey is a 80 y.o. right-handed female with history of TIA, Parkinson's disease, hypertension. Patient lives with her husband using a walker and was receiving home health therapies prior to admission. Admitted 12/21/2013 with slurred speech and right sided numbness as well as elevated systolic pressure in the 785Y. Cranial CT scan as well as MRI of the brain negative for acute changes. Carotid Dopplers with no ICA stenosis. Recent echocardiogram in August of 2015 with ejection fraction of 85% grade 1 diastolic dysfunction. Patient did not receive TPA. Acute hypoxemia chest x-ray showed extensive bilateral airspace disease likely due to pulmonary edema and she was weaned from oxygen. She did require intubation for a short time and extubated 12/22/2013. Neurology services consulted workup suggestive of TIA versus hypertensive encephalopathy and currently maintained on aspirin as well as Plavix for CVA prophylaxis. Dysphagia 3 nectar thick liquid diet monitoring for any signs of aspiration. Physical therapy evaluation completed ongoing with recommendations of physical medicine and rehabilitation consult.  Review of Systems  Gastrointestinal: Positive for constipation.  Musculoskeletal: Positive for falls.  Neurological: Positive for dizziness, tremors, speech change and weakness.  All other systems reviewed and are negative.  Past Medical History  Diagnosis Date  . Hypertension   . Parkinson's disease   . Hyperlipidemia   . Dizziness   . Orthostatic hypotension   . Anemia   . Tremor   . Breast cancer     LEFT BREAST  . Kidney failure     stage 3  . Hypothyroidism   . Prediabetes    Past Surgical History  Procedure Laterality Date  . Cardiovascular stress test  06/2007    NORMAL  . Breast lumpectomy    . Cholecystectomy    . Tonsillectomy     . Nasal sinus surgery      submucous resection late 1950s   Family History  Problem Relation Age of Onset  . Heart disease Mother   . Heart failure Father   . Parkinsonism Father    Social History:  reports that she quit smoking about 49 years ago. Her smoking use included Cigarettes. She has a 6 pack-year smoking history. She does not have any smokeless tobacco history on file. She reports that she does not drink alcohol or use illicit drugs. Allergies:  Allergies  Allergen Reactions  . Aciphex [Rabeprazole Sodium]   . Amantadines   . Avapro [Irbesartan]   . Carbidopa-Levodopa   . Cardura [Doxazosin Mesylate]   . Ciprofloxacin   . Clonidine Derivatives   . Cozaar   . Diltiazem   . Famotidine   . Hctz [Hydrochlorothiazide]   . Indapamide   . Lansoprazole   . Lisinopril     Mouth problems  . Loratadine   . Maxidex [Dexamethasone]   . Mirapex [Pramipexole Dihydrochloride]   . Norvasc [Amlodipine Besylate]   . Pantoprazole Sodium   . Penicillins   . Prilosec [Omeprazole]   . Procardia [Nifedipine]   . Proton Pump Inhibitors   . Sulfa Drugs Cross Reactors   . Trimethoprim   . Zyrtec [Cetirizine Hcl]    Medications Prior to Admission  Medication Sig Dispense Refill  . acetaminophen (TYLENOL) 500 MG tablet Take 500 mg by mouth every 6 (six) hours as needed (pain).      Marland Kitchen antiseptic oral rinse (BIOTENE) LIQD 15 mLs by Mouth Rinse route as needed for  dry mouth.      Marland Kitchen aspirin 81 MG tablet Take 81 mg by mouth every evening.       Marland Kitchen BIOTIN PO Take 1,000 mcg by mouth daily.       . cephALEXin (KEFLEX) 250 MG capsule Take 250 mg by mouth at bedtime. Take 1 at bedtime.      . Cholecalciferol (VITAMIN D3) 2000 UNITS TABS Take 1 tablet by mouth daily.       . clopidogrel (PLAVIX) 75 MG tablet Take 75 mg by mouth every evening.      . Cranberry 500 MG CAPS Take 500 mg by mouth daily.       Marland Kitchen docusate sodium (COLACE) 100 MG capsule Take 100 mg by mouth daily.      Marland Kitchen FOLIC ACID PO  Take 0.6 mg by mouth daily.       . hydroxypropyl methylcellulose / hypromellose (ISOPTO TEARS / GONIOVISC) 2.5 % ophthalmic solution Place 1 drop into both eyes 2 (two) times daily.      Marland Kitchen levothyroxine (SYNTHROID, LEVOTHROID) 100 MCG tablet Take 100 mcg by mouth daily.      . metoprolol (LOPRESSOR) 50 MG tablet Take 1.5 tablets (75 mg total) by mouth 2 (two) times daily.  90 tablet  11  . Multiple Vitamin (MULTIVITAMIN WITH MINERALS) TABS tablet Take 1 tablet by mouth daily.      . Probiotic Product (ALIGN PO) Take 1 capsule by mouth every evening.       . rasagiline (AZILECT) 1 MG TABS Take 1 mg by mouth daily.      . rosuvastatin (CRESTOR) 10 MG tablet Take 10 mg by mouth every evening.       . Rotigotine 8 MG/24HR PT24 Place 1 patch onto the skin daily.       Marland Kitchen spironolactone (ALDACTONE) 25 MG tablet Take 0.5 tablets (12.5 mg total) by mouth daily.  45 tablet  3    Home: Home Living Family/patient expects to be discharged to:: Private residence Living Arrangements: Spouse/significant other Available Help at Discharge: Family;Available 24 hours/day (hsband can provide supervision) Type of Home: House Home Access: Stairs to enter [employer] of Steps: 2 Entrance Stairs-Rails:  (handle on the right side) Home Layout: One level Home Equipment: Walker - 4 wheels;Bedside commode;Shower seat (BSC over the toilet, walk in shower (small)) Additional Comments: HHPT, HH SLP, RN, and aide are still active with [place]  Functional History: Prior Function Level of Independence: Needs assistance Comments: husband drives Functional Status:  Mobility: Bed Mobility Overal bed mobility: Needs Assistance Bed Mobility: Supine to Sit;Sit to Supine Supine to sit: Mod assist Sit to supine: Max assist General bed mobility comments: in recliner on arrival Transfers Overall transfer level: Needs assistance Equipment used: 2 person hand held assist Transfers: Sit to/from Stand Sit to  Stand: Mod assist;Min assist;+2 physical assistance Stand pivot transfers: +2 physical assistance;Mod assist General transfer comment: initial stand pt with posterior lean, extending knees with max cues for safety, transfer and mobility with 2 person assist for safety. After ambulation had pt practice bending knees to chair with anterior weight shift x 5 then pt able to stand x 2 after with min assist Ambulation/Gait Ambulation/Gait assistance: Max assist;+2 safety/equipment Ambulation Distance (Feet): 50 Feet Assistive device: Rolling walker (2 wheeled) Gait Pattern/deviations: Shuffle;Narrow base of support;Leaning posteriorly Gait velocity interpretation: Below normal speed for age/gender General Gait Details: pt initially max assist with P.T. providing anterior force from pt back and sacrum to initiate  gait with max cues, RW and chair following during gt pt able to progress to mod assist with cues and assist at pelvis and trunk for anterior translation. Cues also for increased BOS and transition to increased toe out on LLE    ADL:    Cognition: Cognition Overall Cognitive Status: History of cognitive impairments - at baseline Orientation Level: Oriented X4 Cognition Arousal/Alertness: Awake/alert Behavior During Therapy: WFL for tasks assessed/performed Overall Cognitive Status: History of cognitive impairments - at baseline  Blood pressure 144/57, pulse 86, temperature 97.7 F (36.5 C), temperature source Oral, resp. rate 16, height 5\' 1"  (1.549 m), weight 51.529 kg (113 lb 9.6 oz), SpO2 95.00%. Physical Exam  HENT:  Head: Normocephalic.  Eyes: EOM are normal.  Neck: Normal range of motion. Neck supple. No thyromegaly present.  Cardiovascular: Normal rate and regular rhythm.   Respiratory: Effort normal and breath sounds normal. No respiratory distress. She has no wheezes.  GI: Soft. Bowel sounds are normal. She exhibits no distension. There is no tenderness.  Neurological: She  is alert.  Speech is dysarthric but intelligible. She makes good eye contact with examiner. Patient is oriented x3. Mild resting tremor. Mild cog wheeling. Bilateral hamstrings and heel cords are tight, left more than right. Strength, 4-/5 prox to distal in UE's and 3/5 HF, 4/5 KE and 4/5 ADF/APF. No sensory deficits.   Skin: Skin is warm and dry.  Psychiatric: She has a normal mood and affect. Her behavior is normal.    No results found for this or any previous visit (from the past 24 hour(s)). US Abdomen Limited Ruq  12/24/2013   CLINICAL DATA:  80 year old female with history of high blood pressure, breast cancer, pre diabetes, post cholecystectomy now with elevated transaminases. Initial encounter.  EXAM: US ABDOMEN LIMITED - RIGHT UPPER QUADRANT  COMPARISON:  None.  FINDINGS: Gallbladder:  Surgically removed.  Common bile duct:  Diameter: 4.6 mm.  Liver:  Heterogeneous echotexture which may represent fatty infiltration without focal mass or intrahepatic biliary duct dilation.  IMPRESSION: Post cholecystectomy.  No evidence of common bile duct or intrahepatic biliary duct dilation.  Heterogeneous echotexture liver without focal mass. This may represent result of fatty infiltration   Electronically Signed   By: Chauncey Cruel M.D.   On: 12/24/2013 15:55    Assessment/Plan: Diagnosis: likely TIA complicated by respiratory failure and subsequent debilitation.  1. Does the need for close, 24 hr/day medical supervision in concert with the patient's rehab needs make it unreasonable for this patient to be served in a less intensive setting? Yes 2. Co-Morbidities requiring supervision/potential complications: Parkinson's disease, htn, anemia, sjogren's syndrome 3. Due to bladder management, bowel management, safety, skin/wound care, disease management, medication administration, pain management and patient education, does the patient require 24 hr/day rehab nursing? Yes 4. Does the patient require  coordinated care of a physician, rehab nurse, PT (1-2 hrs/day, 5 days/week) and OT (1-2 hrs/day, 5 days/week) to address physical and functional deficits in the context of the above medical diagnosis(es)? Yes Addressing deficits in the following areas: balance, endurance, locomotion, strength, transferring, bowel/bladder control, bathing, dressing, feeding, grooming, toileting and psychosocial support 5. Can the patient actively participate in an intensive therapy program of at least 3 hrs of therapy per day at least 5 days per week? Yes 6. The potential for patient to make measurable gains while on inpatient rehab is excellent 7. Anticipated functional outcomes upon discharge from inpatient rehab are supervision  with PT, supervision and min assist  with OT, n/a with SLP. 8. Estimated rehab length of stay to reach the above functional goals is: 10-12 days 9. Does the patient have adequate social supports to accommodate these discharge functional goals? Yes 10. Anticipated D/C setting: Home 11. Anticipated post D/C treatments: HH therapy and Outpatient therapy 12. Overall Rehab/Functional Prognosis: excellent  RECOMMENDATIONS: This patient's condition is appropriate for continued rehabilitative care in the following setting: CIR Patient has agreed to participate in recommended program. Yes Note that insurance prior authorization may be required for reimbursement for recommended care.  Comment: The patient and her family are very motivated for her to regain functional mobility and for her to return home. Inpatient rehab would be an ideal environment to promote these functional gains while managing her ongoing medical conditions. Rehab Admissions Coordinator to follow up.  Thanks,  Meredith Staggers, MD, Mellody Drown     12/25/2013

## 2013-12-25 NOTE — Progress Notes (Signed)
Speech Language Pathology Treatment: Dysphagia  Patient Details Name: Victoria Lindsey MRN: [id number] DOB: 01-Aug-1925 Today's Date: 12/25/2013 Time: 1530-1540 SLP Time Calculation (min): 10 min  Assessment / Plan / Recommendation Clinical Impression  F/u after initial swallow eval - pt currently tolerating dysphagia 3 with nectar-thick liquids.  Requires min verbal cues to tuck chin, then able to recall precaution.   Followed for swallowing at home by [facility]; last MBS on 9/1 per daughter. Pt likely ready for repeat study to determine readiness for diet upgrade.  If pt transfers to CIR, recommend MBS after she is admitted there.  If pt remains on acute care and is D/Cd from this venue, recommend MBS prior to D/C.  D/W family, who is in agreement.    HPI HPI: 30 F with Parkinson's Disease, CKD, HTN, pre-DM who presented [facility]16 with likely TIA difficulty in getting words out and right arm felt funny, lasting about one hour and resolved. Subsequently developed severe hypoxic respiratory failure almost certainly 2/2 aspiration during MRI. Intubated from 10/16 to 10/17. MRI showed no acute finding. Pt reproted in chart to have a history of dysphagia and drinks nectar thick liquids at home but no notes from SLP are available.    Pertinent Vitals Pain Assessment: 0-10 Pain Score: 4  Pain Location: back Pain Descriptors / Indicators: Discomfort Pain Intervention(s): Monitored during session;Repositioned  SLP Plan  Continue with current plan of care    Recommendations Diet recommendations: Dysphagia 3 (mechanical soft);Nectar-thick liquid Medication Administration: Whole meds with liquid Supervision: Full supervision/cueing for compensatory strategies Compensations: Slow rate;Small sips/bites Postural Changes and/or Swallow Maneuvers: Chin tuck;Seated upright 90 degrees;Upright 30-60 min after meal              Oral Care Recommendations: Oral care BID Follow up Recommendations: Inpatient  Rehab Plan: Continue with current plan of care   Shi Grose L. Tivis Ringer, [place] CCC/SLP Pager [phone]      Juan Quam Laurice 12/25/2013, 3:47 PM

## 2013-12-25 NOTE — Progress Notes (Signed)
  Date: 12/25/2013  Patient name: Victoria Lindsey  Medical record number: [id number]  Date of birth: 1925/04/04   This patient has been seen and the plan of care was discussed with the house staff. Please see their note for complete details. I concur with their findings with the following additions/corrections:  Believe she is appropriate for CIR given comorbidities and generalized weakness after ICU stay.    Sid Falcon, MD 12/25/2013, 12:28 PM

## 2013-12-25 NOTE — Care Management Note (Signed)
    Page 1 of 1   12/26/2013     2:36:40 PM CARE MANAGEMENT NOTE 12/26/2013  Patient:  Victoria Lindsey, Victoria Lindsey   Account Number:  [id number]  Date Initiated:  12/25/2013  Documentation initiated by:  GRAVES-BIGELOW,Sania Noy  Subjective/Objective Assessment:   Pt admitted for Elevated blood pressure, transient slurred speech. Pt is from home with husband and support of daughter. Pt is currently active wit [name] for RN, PT, SLP and Aide.     Action/Plan:   CIR has been consulted for pt and CSW to speak to family in regards to SNF back up. CM will continue to  monitor for disposition needs.   Anticipated DC Date:  12/26/2013   Anticipated DC Plan:  IP REHAB FACILITY      DC Planning Services  CM consult      Choice offered to / List presented to:             Status of service:  Completed, signed off Medicare Important Message given?  YES (If response is "NO", the following Medicare IM given date fields will be blank) Date Medicare IM given:  12/25/2013 Medicare IM given by:  GRAVES-BIGELOW,Jacole Capley Date Additional Medicare IM given:   Additional Medicare IM given by:    Discharge Disposition:  [place]  Per UR Regulation:  Reviewed for med. necessity/level of care/duration of stay  If discussed at [place] of Stay Meetings, dates discussed:   12/27/2013    Comments:

## 2013-12-25 NOTE — Progress Notes (Signed)
I have seen the patient and reviewed the daily progress note by Jake Bathe MS 4 and discussed the care of the patient with them.  See below for documentation of my findings, assessment, and plans.  Subjective: Patient reports she is doing well, breathing continues to improve. Family pleased at potential of CIR. Objective: Vital signs in last 24 hours: Filed Vitals:   12/24/13 2055 12/24/13 2227 12/24/13 2306 12/25/13 0502  BP: 176/52 183/81 125/44 144/57  Pulse: 84   86  Temp: 97.5 F (36.4 C)   97.7 F (36.5 C)  TempSrc: Oral   Oral  Resp: 16   16  Height:      Weight:    113 lb 9.6 oz (51.529 kg)  SpO2: 96%   95%   Weight change:   Intake/Output Summary (Last 24 hours) at 12/25/13 1124 Last data filed at 12/25/13 0502  Gross per 24 hour  Intake    120 ml  Output    100 ml  Net     20 ml   General: resting in bed HEENT: EOMI, no scleral icterus Cardiac: RRR, no murmurs  Pulm: CTAB no wheezing or rales Abd: soft, nontender, nondistended, BS present Ext: warm and well perfused, no pedal edema Neuro: alert and oriented X3, lower extremity strength 5/5.  Lab Results: Reviewed and documented in Electronic Record Micro Results: Reviewed and documented in Electronic Record Studies/Results: Reviewed and documented in Electronic Record Medications: I have reviewed the patient's current medications. Scheduled Meds: . aspirin EC  81 mg Oral Daily  . clopidogrel  75 mg Oral Daily  . docusate sodium  100 mg Oral Daily  . folic acid  1 mg Oral Daily  . levothyroxine  100 mcg Oral QAC breakfast  . metoprolol  75 mg Oral BID  . rasagiline  1 mg Oral Daily  . rosuvastatin  10 mg Oral q1800  . rotigotine  2 patch Transdermal Daily  . sodium chloride  3 mL Intravenous Q12H  . [START ON 12/26/2013] spironolactone  25 mg Oral Daily   Continuous Infusions:  PRN Meds:.hydrALAZINE, metoprolol, RESOURCE THICKENUP CLEAR Assessment/Plan: TIA (transient ischemic attack)  -  Patient has completed workup by Neurology, did not obtain MRA as unlikely to change management and risks (recurrent aspiration) greater then benefit. Her TSH is mildly elevated but she is taking synthroid.  - Continue Antiplatelet therapy with 81mg  ASA and 75mg  plavix  -Lipid panel shows LDL of 31 on Crestor 10mg . -Will need 30 day cardiac monitor on discharge - Continue monitoring telemetry until discharge. (Remains in NSR) - PMR-MD recommends inpatient rehab, I feel this is very important especially given comorbidities and acute respiratory failure requiring intubation and ICU stay, will see if insurance approves.  Acute Respiratory Failure- resolved  -Likely secondary to aspiration event in MRI but also possible flash pulmonary edema given her hypertension.  - She is off oxygen and doing well, still weak after ICU stay.  HTN  - Still hypertensive -On home metoprolol 75mg  BID  - Increase sprinoloactone to 25mg  daily  - PRN hydralazine.   Parkinson's disease  - Continue rasagiline and rotigotine   Hypothyroidism  -TSH 7  -Synthroid 167mcg   Hypercholesterolemia  - Crestor 10mg    Normocytic anemia and Thrombocytopenia  - Hgb stable, platelets improved. No evidence of microangiopathic hemolysis.  - Follow up with outpatient hematologist.   Transaminitis  - Very mild, U/S of liver c/w fatty infiltration of liver, I feel this is likely a  better explaination of her very mild increase in LFTs. Will have patient continue Crestor 10mg  and follow up with PCP   Dispo: Disposition is deferred at this time, awaiting improvement of current medical problems.  Anticipated discharge in approximately 1 day(s).   The patient does have a current PCP Elba Barman, MD) and does not need an [facility] hospital follow-up appointment after discharge.  The patient does not have transportation limitations that hinder transportation to clinic appointments.  .Services Needed at time of discharge: Y =  Yes, Blank = No PT:   OT:   RN:   Equipment:   Other:     LOS: 4 days   Lucious Groves, DO 12/25/2013, 11:24 AM

## 2013-12-25 NOTE — Progress Notes (Signed)
Subjective: Ms. Kutter reports improvement in her breathing. She complains of some pain and stiffness in her left thigh and calf when she walks, which feels similar to her pain from Parkinson's. No leg swelling or warmth.   Objective: Vital signs in last 24 hours: Filed Vitals:   12/24/13 2055 12/24/13 2227 12/24/13 2306 12/25/13 0502  BP: 176/52 183/81 125/44 144/57  Pulse: 84   86  Temp: 97.5 F (36.4 C)   97.7 F (36.5 C)  TempSrc: Oral   Oral  Resp: 16   16  Height:      Weight:    51.529 kg (113 lb 9.6 oz)  SpO2: 96%   95%   Weight change:   Intake/Output Summary (Last 24 hours) at 12/25/13 1045 Last data filed at 12/25/13 0502  Gross per 24 hour  Intake    120 ml  Output    100 ml  Net     20 ml   Physical Exam: General: Patient is thin-appearing, in no acute distress and cooperative with exam.  Cardiovascular: RRR, S1 normal, S2 normal, faint systolic murmur  Pulmonary/Chest: CTAB, respirations unlabored Abdominal: Soft, nontender, nondistended Neuro: Alert and oriented x3, EOMI, Strength and sensation intact throughout, CN 2-12 intact  Extremities: no edema Skin: Warm, dry and intact. No rashes or erythema. Psychiatric: Normal mood and affect. speech and behavior is normal. Cognition and memory are normal.   Lab Results: Results for orders placed during the hospital encounter of 12/21/13 (from the past 24 hour(s))  COMPREHENSIVE METABOLIC PANEL     Status: Abnormal   Collection Time    12/25/13  8:44 AM      Result Value Ref Range   Sodium 139  137 - 147 mEq/L   Potassium 4.0  3.7 - 5.3 mEq/L   Chloride 101  96 - 112 mEq/L   CO2 26  19 - 32 mEq/L   Glucose, Bld 134 (*) 70 - 99 mg/dL   BUN 31 (*) 6 - 23 mg/dL   Creatinine, Ser 0.63  0.50 - 1.10 mg/dL   Calcium 9.2  8.4 - 10.5 mg/dL   Total Protein 6.8  6.0 - 8.3 g/dL   Albumin 3.2 (*) 3.5 - 5.2 g/dL   AST 67 (*) 0 - 37 U/L   ALT 51 (*) 0 - 35 U/L   Alkaline Phosphatase 128 (*) 39 - 117 U/L   Total  Bilirubin 0.5  0.3 - 1.2 mg/dL   GFR calc non Af Amer 80 (*) >90 mL/min   GFR calc Af Amer >90  >90 mL/min   Anion gap 12  5 - 15   Micro Results: No results found for this or any previous visit (from the past 240 hour(s)). Studies/Results: US Abdomen Limited Ruq  12/24/2013   CLINICAL DATA:  80 year old female with history of high blood pressure, breast cancer, pre diabetes, post cholecystectomy now with elevated transaminases. Initial encounter.  EXAM: US ABDOMEN LIMITED - RIGHT UPPER QUADRANT  COMPARISON:  None.  FINDINGS: Gallbladder:  Surgically removed.  Common bile duct:  Diameter: 4.6 mm.  Liver:  Heterogeneous echotexture which may represent fatty infiltration without focal mass or intrahepatic biliary duct dilation.  IMPRESSION: Post cholecystectomy.  No evidence of common bile duct or intrahepatic biliary duct dilation.  Heterogeneous echotexture liver without focal mass. This may represent result of fatty infiltration   Electronically Signed   By: Chauncey Cruel M.D.   On: 12/24/2013 15:55   Medications: I have reviewed the  patient's current medications. Scheduled Meds: . aspirin EC  81 mg Oral Daily  . clopidogrel  75 mg Oral Daily  . docusate sodium  100 mg Oral Daily  . folic acid  1 mg Oral Daily  . levothyroxine  100 mcg Oral QAC breakfast  . metoprolol  75 mg Oral BID  . rasagiline  1 mg Oral Daily  . rosuvastatin  10 mg Oral q1800  . rotigotine  2 patch Transdermal Daily  . sodium chloride  3 mL Intravenous Q12H  . spironolactone  12.5 mg Oral Once  . [START ON 12/26/2013] spironolactone  25 mg Oral Daily   Continuous Infusions:  PRN Meds:.hydrALAZINE, metoprolol, RESOURCE THICKENUP CLEAR Assessment/Plan: Principal Problem:   TIA (transient ischemic attack) Active Problems:   Benign hypertensive heart disease without heart failure   Parkinson's disease   Hypothyroidism   Hypercholesterolemia   Sjogren's syndrome   Dyspnea   Normocytic anemia    Thrombocytopenia   Respiratory insufficiency   Transaminitis  Acute respiratory failure - resolved. Extubated 10/17. Patient sating 100% on room air, lungs CTAB. Likely secondary to aspiration pneumonitis vs. Flash pulmonary edema.  -Dysphagia 1 diet per speech therapy to prevent aspiration  TIA (transient ischemic attack): Patient presented with 1 hr of slurred speech and R hand tingling, now resolved. CT, MRI negative. -MRA cancelled as patient did not tolerate MRI  - Carotid dopplers 0-40% stenosis - A1c and Lipid panel normal - Continue ASA and Plavix for antiplatelet therapy.  - continue PT, will d/c today or tomorrow to inpatient rehab pending bed availability   HTN: BP elevated with systolics 329V-916O. - Metoprolol 74m BID - Increase spirinolactone to 233mdaily -Hydralazine PRN for SBP >170 mmHg  Parkinson's disease  - Continue rasagiline and rotigotine   Hypothyroidism: TSH 7  -Continue home synthroid  -f/u as outpatient  Hypercholesterolemia  - Continue crestor   Sjogren's syndrome  -Stable   Normocytic anemia and Thrombocytopenia: Hgb stable at 10.6, platelet count improved to 107 from 84. No abnormalities on anemia panel. -F/u with outpatient hemotologist.   Elevated Alk Phos/ALT/AST: Hepatitis panel wnl - RUQ U/S showed likely fatty infiltration of liver  This is a [employer] information officerote.  The care of the patient was discussed with Dr. HoHeber Carolinand the assessment and plan formulated with their assistance.  Please see their attached note for official documentation of the daily encounter.   LOS: 4 days   DiCiro BackerMed Student 12/25/2013, 10:45 AM

## 2013-12-26 LAB — BASIC METABOLIC PANEL
Anion gap: 10 (ref 5–15)
BUN: 32 mg/dL — ABNORMAL HIGH (ref 6–23)
CHLORIDE: 103 meq/L (ref 96–112)
CO2: 26 meq/L (ref 19–32)
CREATININE: 0.68 mg/dL (ref 0.50–1.10)
Calcium: 8.8 mg/dL (ref 8.4–10.5)
GFR calc Af Amer: 90 mL/min — ABNORMAL LOW (ref >90)
GFR calc non Af Amer: 78 mL/min — ABNORMAL LOW (ref >90)
GLUCOSE: 97 mg/dL (ref 70–99)
Potassium: 4.4 mEq/L (ref 3.7–5.3)
Sodium: 139 mEq/L (ref 137–147)

## 2013-12-26 LAB — TROPONIN I: Troponin I: <0.3 ng/mL (ref <0.30)

## 2013-12-26 LAB — CBC
HEMATOCRIT: 31.3 % — AB (ref 36.0–46.0)
HEMOGLOBIN: 10.1 g/dL — AB (ref 12.0–15.0)
MCH: 31.7 pg (ref 26.0–34.0)
MCHC: 32.3 g/dL (ref 30.0–36.0)
MCV: 98.1 fL (ref 78.0–100.0)
Platelets: 126 10*3/uL — ABNORMAL LOW (ref 150–400)
RBC: 3.19 MIL/uL — AB (ref 3.87–5.11)
RDW: 15.2 % (ref 11.5–15.5)
WBC: 5.4 10*3/uL (ref 4.0–10.5)

## 2013-12-26 MED ORDER — RISAQUAD PO CAPS
1.0000 | ORAL_CAPSULE | Freq: Every day | ORAL | Status: DC
  Administered 2013-12-26 – 2013-12-27 (×2): 1 via ORAL
  Filled 2013-12-26 (×3): qty 1

## 2013-12-26 MED ORDER — GI COCKTAIL ~~LOC~~
30.0000 mL | Freq: Once | ORAL | Status: AC
  Administered 2013-12-26: 30 mL via ORAL
  Filled 2013-12-26: qty 30

## 2013-12-26 MED ORDER — IPRATROPIUM-ALBUTEROL 0.5-2.5 (3) MG/3ML IN SOLN
3.0000 mL | Freq: Once | RESPIRATORY_TRACT | Status: AC
  Administered 2013-12-26: 3 mL via RESPIRATORY_TRACT
  Filled 2013-12-26: qty 3

## 2013-12-26 MED ORDER — CEPHALEXIN 250 MG PO CAPS
250.0000 mg | ORAL_CAPSULE | Freq: Every day | ORAL | Status: DC
  Administered 2013-12-26: 250 mg via ORAL
  Filled 2013-12-26 (×2): qty 1

## 2013-12-26 NOTE — Progress Notes (Signed)
Clinical Social Work Department BRIEF PSYCHOSOCIAL ASSESSMENT 12/26/2013  Patient:  Victoria Lindsey, Victoria Lindsey     Account Number:  [id number]     Admit date:  12/21/2013  Clinical Social Worker:  Adair Laundry  Date/Time:  12/26/2013 02:00 PM  Referred by:  Physician  Date Referred:  12/26/2013 Referred for  SNF Placement   Other Referral:   Interview type:  Patient Other interview type:   Spoke with pt, pt daughter, and pt husband at bedside    PSYCHOSOCIAL DATA Living Status:  HUSBAND Admitted from facility:   Level of care:   Primary support name:  Annetta Maw [phone] Primary support relationship to patient:  CHILD, ADULT Degree of support available:   Pt has very strong family support    CURRENT CONCERNS Current Concerns  Post-Acute Placement   Other Concerns:    SOCIAL WORK ASSESSMENT / PLAN CSW made aware that pt insurance denied CIR. CSW visited pt room and spoke with pt and pt family about SNF. They informed CSW that after much thought this is what they are hoping for pt dc plan to be. They are open to referral being sent to all [facility] but expressed a preference for [facility] or [facility]. CSW informed them that CSW would call both facilities and notify.    CSW heard back from both facilities and they are able to offer a bed. CSW notified pt and family of bed offers from all facilities. Pt husband and daughter wanting to visit before making a decision. They are aware that plan is for dc tomorrow and will notify CSW of facility choice in the morning.   Assessment/plan status:  Psychosocial Support/Ongoing Assessment of Needs Other assessment/ plan:   Information/referral to community resources:   SNF list    PATIENT'S/FAMILY'S RESPONSE TO PLAN OF CARE: Pt and pt family agreeable to [facility]       [place], [place]

## 2013-12-26 NOTE — Progress Notes (Signed)
Subjective: No complaints this morning. Will d/c to [facility] pending insurance approval and bed availability.  Objective: Vital signs in last 24 hours: Filed Vitals:   12/26/13 0001 12/26/13 0400 12/26/13 0507 12/26/13 1005  BP: 142/50 154/66 168/62 146/52  Pulse: 76 72 79 75  Temp: 98.4 F (36.9 C) 98.3 F (36.8 C) 97.8 F (36.6 C)   TempSrc:   Oral   Resp: 16  18   Height:      Weight:   52.8 kg (116 lb 6.5 oz)   SpO2: 95% 96% 94%    Weight change: 1.271 kg (2 lb 12.9 oz)  Intake/Output Summary (Last 24 hours) at 12/26/13 1058 Last data filed at 12/25/13 2246  Gross per 24 hour  Intake      0 ml  Output    202 ml  Net   -202 ml   Physical Exam: General: Patient is thin-appearing, in no acute distress and cooperative with exam.  Cardiovascular: RRR, S1 normal, S2 normal, faint systolic murmur  Pulmonary/Chest: CTAB, respirations unlabored Abdominal: Soft, nontender, nondistended Neuro: Alert and oriented x3, EOMI, Strength and sensation intact throughout, CN 2-12 intact  Extremities: no edema Skin: Warm, dry and intact. No rashes or erythema. Psychiatric: Normal mood and affect. speech and behavior is normal. Cognition and memory are normal.   Lab Results: Results for orders placed during the hospital encounter of 12/21/13 (from the past 24 hour(s))  CBC     Status: Abnormal   Collection Time    12/26/13  5:55 AM      Result Value Ref Range   WBC 5.4  4.0 - 10.5 K/uL   RBC 3.19 (*) 3.87 - 5.11 MIL/uL   Hemoglobin 10.1 (*) 12.0 - 15.0 g/dL   HCT 31.3 (*) 36.0 - 46.0 %   MCV 98.1  78.0 - 100.0 fL   MCH 31.7  26.0 - 34.0 pg   MCHC 32.3  30.0 - 36.0 g/dL   RDW 15.2  11.5 - 15.5 %   Platelets 126 (*) 150 - 400 K/uL  BASIC METABOLIC PANEL     Status: Abnormal   Collection Time    12/26/13  5:55 AM      Result Value Ref Range   Sodium 139  137 - 147 mEq/L   Potassium 4.4  3.7 - 5.3 mEq/L   Chloride 103  96 - 112 mEq/L   CO2 26  19 - 32 mEq/L   Glucose, Bld 97  70 -  99 mg/dL   BUN 32 (*) 6 - 23 mg/dL   Creatinine, Ser 0.68  0.50 - 1.10 mg/dL   Calcium 8.8  8.4 - 10.5 mg/dL   GFR calc non Af Amer 78 (*) >90 mL/min   GFR calc Af Amer 90 (*) >90 mL/min   Anion gap 10  5 - 15   Micro Results: No results found for this or any previous visit (from the past 240 hour(s)). Studies/Results: US Abdomen Limited Ruq  12/24/2013   CLINICAL DATA:  80 year old female with history of high blood pressure, breast cancer, pre diabetes, post cholecystectomy now with elevated transaminases. Initial encounter.  EXAM: US ABDOMEN LIMITED - RIGHT UPPER QUADRANT  COMPARISON:  None.  FINDINGS: Gallbladder:  Surgically removed.  Common bile duct:  Diameter: 4.6 mm.  Liver:  Heterogeneous echotexture which may represent fatty infiltration without focal mass or intrahepatic biliary duct dilation.  IMPRESSION: Post cholecystectomy.  No evidence of common bile duct or intrahepatic biliary duct dilation.  Heterogeneous echotexture liver without focal mass. This may represent result of fatty infiltration   Electronically Signed   By: Chauncey Cruel M.D.   On: 12/24/2013 15:55   Medications: I have reviewed the patient's current medications. Scheduled Meds: . aspirin EC  81 mg Oral Daily  . clopidogrel  75 mg Oral Daily  . docusate sodium  100 mg Oral Daily  . folic acid  1 mg Oral Daily  . levothyroxine  100 mcg Oral QAC breakfast  . metoprolol  75 mg Oral BID  . rasagiline  1 mg Oral Daily  . rosuvastatin  10 mg Oral q1800  . rotigotine  2 patch Transdermal Daily  . sodium chloride  3 mL Intravenous Q12H  . spironolactone  25 mg Oral Daily   Continuous Infusions:  PRN Meds:.hydrALAZINE, metoprolol, RESOURCE THICKENUP CLEAR Assessment/Plan: Principal Problem:   TIA (transient ischemic attack) Active Problems:   Benign hypertensive heart disease without heart failure   Parkinson's disease   Hypothyroidism   Hypercholesterolemia   Sjogren's syndrome   Dyspnea   Normocytic  anemia   Thrombocytopenia   Respiratory insufficiency   Transaminitis  Acute respiratory failure - resolved. Extubated 10/17. Patient sating 100% on room air, lungs CTAB. Likely secondary to aspiration pneumonitis vs. Flash pulmonary edema.  -Dysphagia 1 diet per speech therapy to prevent aspiration  TIA (transient ischemic attack): Patient presented with 1 hr of slurred speech and R hand tingling, now resolved. CT, MRI negative. -MRA cancelled as patient did not tolerate MRI  - Carotid dopplers 0-40% stenosis - A1c and Lipid panel normal - Continue ASA and Plavix for antiplatelet therapy.  - continue PT, will d/c today or tomorrow to inpatient rehab pending bed availability   Recurrent UTIs -restart home Keflex  HTN: BP improved with systolics 175Z-025E. - Metoprolol 67m BID -Spirinolactone to 213mdaily -Hydralazine PRN for SBP >170 mmHg  Parkinson's disease  - Continue rasagiline and rotigotine   Hypothyroidism: TSH 7  -Continue home synthroid  -f/u as outpatient  Hypercholesterolemia  - Continue crestor   Sjogren's syndrome  -Stable   Normocytic anemia and Thrombocytopenia: Hgb stable at 10.6, platelet count improved to 107 from 84. No abnormalities on anemia panel. -F/u with outpatient hemotologist.   Elevated Alk Phos/ALT/AST: Hepatitis panel wnl - RUQ U/S showed likely fatty infiltration of liver  This is a [employer] information officerote.  The care of the patient was discussed with Dr. HoHeber Carolinand the assessment and plan formulated with their assistance.  Please see their attached note for official documentation of the daily encounter.   LOS: 5 days   DiCiro BackerMed Student 12/26/2013, 10:58 AM

## 2013-12-26 NOTE — Progress Notes (Signed)
Physical Therapy Treatment Patient Details Name: Victoria Lindsey MRN: [id number] DOB: 1926/01/08 Today's Date: 12/26/2013    History of Present Illness 80 y.o. female admitted to [facility] on 12/21/13 with trouble speaking and "R hand feeling funny" and increased SOB.  Stroke was suspected, but MRI negative.  Pt was though to have had a TIA.  During her hospital course she also developed severe hypoxic respiratory failure (per MD likely due to aspiration) and pt had to be intubated (12/21/13-12/22/13).  Pt with significant PMHx of HTN, Parkinson's disease, orthostatic hypotension, dizziness, anemia, tremor, Breast CA L breast, stage 3 kidney failure, pre diabetes, and h/o dysphasia (per daughter).      PT Comments    Progressing slowly.  The key is time up and grading away assist from the back.  Follow Up Recommendations  SNF (insurance refusal for CIR)     Equipment Recommendations  None recommended by PT    Recommendations for Other Services       Precautions / Restrictions Precautions Precautions: Fall Precaution Comments: posterior lean, parkinson's    Mobility  Bed Mobility               General bed mobility comments: in recliner on arrival  Transfers Overall transfer level: Needs assistance Equipment used: 1 person hand held assist Transfers: Sit to/from Stand Sit to Stand: Mod assist;Max assist (depending on surface--recliner vs toilet) Stand pivot transfers: Mod assist       General transfer comment: significant posterior lean, improved as patient stays on her feet   Ambulation/Gait Ambulation/Gait assistance: Max assist;Mod assist (improving to mod generally as distance and time progressed) Ambulation Distance (Feet): 140 Feet Assistive device: Rolling walker (2 wheeled) Gait Pattern/deviations: Step-through pattern;Narrow base of support Gait velocity: slow   General Gait Details: Initially max assist, heavy lean posteriorly with narrowed BOS.  As she  progressed, with vc and lessening of tatile cues/assist, pt able to come forward and be less retropulsive..  At her best she took 2-3 steps propelling forward with close guard.   Stairs            Wheelchair Mobility    Modified Rankin (Stroke Patients Only)       Balance Overall balance assessment: Needs assistance Sitting-balance support: Feet supported;No upper extremity supported Sitting balance-Leahy Scale: Fair Sitting balance - Comments: edge of chair with BUE supported. kyphotic posture     Standing balance-Leahy Scale: Zero                      Cognition Arousal/Alertness: Awake/alert Behavior During Therapy: WFL for tasks assessed/performed Overall Cognitive Status: History of cognitive impairments - at baseline                      Exercises      General Comments        Pertinent Vitals/Pain Pain Assessment: No/denies pain    Home Living                      Prior Function            PT Goals (current goals can now be found in the care plan section) Acute Rehab PT Goals Patient Stated Goal: to go home, get stronger PT Goal Formulation: With patient Time For Goal Achievement: 01/06/14 Potential to Achieve Goals: Good Progress towards PT goals: Progressing toward goals    Frequency  Min 3X/week    PT Plan Discharge plan  needs to be updated;Frequency needs to be updated    Co-evaluation             End of Session Equipment Utilized During Treatment: Gait belt Activity Tolerance: Patient tolerated treatment well Patient left: in chair;with call bell/phone within reach     Time: 1638-1710 PT Time Calculation (min): 32 min  Charges:  $Gait Training: 8-22 mins $Therapeutic Activity: 8-22 mins                    G Codes:      Jalaysha Skilton, Tessie Fass 12/26/2013, 5:33 PM 12/26/2013  Donnella Sham, PT [phone]  (pager)

## 2013-12-26 NOTE — Progress Notes (Signed)
Patient rates pain now at 2/10, states that she feels better, wheezing has decreased. Will continue to monitor. Oval Linsey, RN

## 2013-12-26 NOTE — Progress Notes (Signed)
  Date: 12/26/2013  Patient name: Victoria Lindsey  Medical record number: [id number]  Date of birth: Aug 03, 1925   This patient has been seen and the plan of care was discussed with the house staff. Please see their note for complete details. I concur with their findings with the following additions/corrections:  Insurance has refused CIR.  Will attempt for SNF with rehab today.   Sid Falcon, MD 12/26/2013, 1:47 PM

## 2013-12-26 NOTE — Progress Notes (Addendum)
Clinical Social Work Department CLINICAL SOCIAL WORK PLACEMENT NOTE 12/26/2013  Patient:  Victoria Lindsey, Victoria Lindsey  Account Number:  [id number] Admit date:  12/21/2013  Clinical Social Worker:  Berton Mount, Latanya Presser  Date/time:  12/26/2013 02:00 PM  Clinical Social Work is seeking post-discharge placement for this patient at the following level of care:   SKILLED NURSING   (*CSW will update this form in Epic as items are completed)   12/26/2013  Patient/family provided with [place] Department of Clinical Social Work's list of facilities offering this level of care within the geographic area requested by the patient (or if unable, by the patient's family).  12/26/2013  Patient/family informed of their freedom to choose among providers that offer the needed level of care, that participate in Medicare, Medicaid or managed care program needed by the patient, have an available bed and are willing to accept the patient.  12/26/2013  Patient/family informed of [facility]' ownership interest in [facility], as well as of the fact that they are under no obligation to receive care at this facility.  PASARR submitted to EDS on 12/26/2013 PASARR number received on 12/26/2013  FL2 transmitted to all facilities in geographic area requested by pt/family on  12/26/2013 FL2 transmitted to all facilities within larger geographic area on   Patient informed that his/her managed care company has contracts with or will negotiate with  certain facilities, including the following:     Patient/family informed of bed offers received:  12/26/2013 Patient chooses bed at [facility] Physician recommends and patient chooses bed at    Patient to be transferred to [facility] on  12/27/2013 Patient to be transferred to facility by [facility] Patient and family notified of transfer on 12/27/2013 Name of family member notified:  Annetta Maw  The following physician request were entered in  Epic:   Additional Comments:  Berton Mount, [place]

## 2013-12-26 NOTE — Progress Notes (Signed)
  I have seen and examined the patient, and reviewed the daily progress note by Jake Bathe, MS 4 and discussed the care of the patient with them. Please see my progress note from 12/26/2013 for further details regarding assessment and plan.   -Insurance denied CIR >> plan for SNF discharge   Signed:  Lucious Groves, DO 12/26/2013, 1:19 PM

## 2013-12-26 NOTE — Progress Notes (Addendum)
Patient has expiratory wheezes in upper lobes, SpO2 on Room Air is 93%, made Dr. Randell Patient aware, received order for patient to get albuterol treatment. Called respiratory and made aware of the order.  Oval Linsey, RN

## 2013-12-26 NOTE — Progress Notes (Signed)
[employer] medicare has denied approval to admit to inpt rehab. I spoke with pt, her husband, and daughter at bedside. They do not want to pay privately for admission at this time. They are requesting SNF rehab at [facility] at this time. I have discussed with RN Everson and SW. [phone]

## 2013-12-26 NOTE — Progress Notes (Signed)
Subjective: Patient still feeling weak but denies any SOB or other complaints Objective: Vital signs in last 24 hours: Filed Vitals:   12/26/13 0001 12/26/13 0400 12/26/13 0507 12/26/13 1005  BP: 142/50 154/66 168/62 146/52  Pulse: 76 72 79 75  Temp: 98.4 F (36.9 C) 98.3 F (36.8 C) 97.8 F (36.6 C)   TempSrc:   Oral   Resp: 16  18   Height:      Weight:   116 lb 6.5 oz (52.8 kg)   SpO2: 95% 96% 94%    Weight change: 2 lb 12.9 oz (1.271 kg)  Intake/Output Summary (Last 24 hours) at 12/26/13 1320 Last data filed at 12/25/13 2246  Gross per 24 hour  Intake      0 ml  Output    101 ml  Net   -101 ml   General: resting in bed  Cardiac: RRR, no murmurs  Pulm: CTAB no wheezing or rales  Abd: soft, nontender, nondistended, BS present  Ext:  no pedal edema    Lab Results: Basic Metabolic Panel:  Recent Labs Lab 12/25/13 0844 12/26/13 0555  NA 139 139  K 4.0 4.4  CL 101 103  CO2 26 26  GLUCOSE 134* 97  BUN 31* 32*  CREATININE 0.63 0.68  CALCIUM 9.2 8.8   Liver Function Tests:  Recent Labs Lab 12/22/13 0239 12/25/13 0844  AST 58* 67*  ALT 57* 51*  ALKPHOS 136* 128*  BILITOT 0.4 0.5  PROT 6.9 6.8  ALBUMIN 3.0* 3.2*   No results found for this basename: LIPASE, AMYLASE,  in the last 168 hours No results found for this basename: AMMONIA,  in the last 168 hours CBC:  Recent Labs Lab 12/21/13 1514  12/24/13 0309 12/26/13 0555  WBC 4.8  < > 5.0 5.4  NEUTROABS 2.6  --   --   --   HGB 11.1*  < > 10.6* 10.1*  HCT 33.9*  < > 32.4* 31.3*  MCV 95.5  < > 95.6 98.1  PLT 85*  < > 107* 126*  < > = values in this interval not displayed. Cardiac Enzymes: No results found for this basename: CKTOTAL, CKMB, CKMBINDEX, TROPONINI,  in the last 168 hours BNP: No results found for this basename: PROBNP,  in the last 168 hours D-Dimer: No results found for this basename: DDIMER,  in the last 168 hours CBG:  Recent Labs Lab 12/22/13 2011 12/22/13 2357  12/23/13 0401 12/23/13 0812 12/23/13 1203 12/23/13 1638  GLUCAP 91 91 82 80 94 211*   Hemoglobin A1C:  Recent Labs Lab 12/21/13 1940  HGBA1C 6.4*   Fasting Lipid Panel:  Recent Labs Lab 12/22/13 0239  CHOL 99  HDL 41  LDLCALC 31  TRIG 133  CHOLHDL 2.4   Thyroid Function Tests:  Recent Labs Lab 12/21/13 1940  TSH 7.040*   Coagulation:  Recent Labs Lab 12/21/13 1351 12/21/13 2300  LABPROT 14.7 15.3*  INR 1.14 1.19   Anemia Panel:  Recent Labs Lab 12/21/13 1940  VITAMINB12 753  FOLATE >20.0  FERRITIN 145  TIBC 219*  IRON 68  RETICCTPCT 2.1   Urine Drug Screen: Drugs of Abuse  No results found for this basename: labopia, cocainscrnur, labbenz, amphetmu, thcu, labbarb    Alcohol Level: No results found for this basename: ETH,  in the last 168 hours Urinalysis:  Recent Labs Lab 12/21/13 1650  COLORURINE YELLOW  LABSPEC 1.016  PHURINE 6.5  [place]  NEGATIVE  KETONESUR NEGATIVE  PROTEINUR 100*  UROBILINOGEN 1.0  NITRITE NEGATIVE  LEUKOCYTESUR NEGATIVE    Micro Results: No results found for this or any previous visit (from the past 240 hour(s)). Studies/Results: US Abdomen Limited Ruq  12/24/2013   CLINICAL DATA:  80 year old female with history of high blood pressure, breast cancer, pre diabetes, post cholecystectomy now with elevated transaminases. Initial encounter.  EXAM: US ABDOMEN LIMITED - RIGHT UPPER QUADRANT  COMPARISON:  None.  FINDINGS: Gallbladder:  Surgically removed.  Common bile duct:  Diameter: 4.6 mm.  Liver:  Heterogeneous echotexture which may represent fatty infiltration without focal mass or intrahepatic biliary duct dilation.  IMPRESSION: Post cholecystectomy.  No evidence of common bile duct or intrahepatic biliary duct dilation.  Heterogeneous echotexture liver without focal mass. This may represent result of fatty infiltration   Electronically Signed   By: Chauncey Cruel M.D.   On:  12/24/2013 15:55   Medications: I have reviewed the patient's current medications. Scheduled Meds: . acidophilus  1 capsule Oral Daily  . aspirin EC  81 mg Oral Daily  . cephALEXin  250 mg Oral QHS  . clopidogrel  75 mg Oral Daily  . docusate sodium  100 mg Oral Daily  . folic acid  1 mg Oral Daily  . levothyroxine  100 mcg Oral QAC breakfast  . metoprolol  75 mg Oral BID  . rasagiline  1 mg Oral Daily  . rosuvastatin  10 mg Oral q1800  . rotigotine  2 patch Transdermal Daily  . sodium chloride  3 mL Intravenous Q12H  . spironolactone  25 mg Oral Daily   Continuous Infusions:  PRN Meds:.hydrALAZINE, metoprolol, RESOURCE THICKENUP CLEAR Assessment/Plan: TIA (transient ischemic attack)  - Patient has completed workup by Neurology. - Continue Antiplatelet therapy with 81mg  ASA and 75mg  plavix  -Lipid panel shows LDL of 31 on Crestor 10mg .  -Will need 30 day cardiac monitor on discharge  - CIR denied by insurance family plans for discharge to SNF, hopefully today.  HTN  - BP 142/52 this AM on Metoprolol 75mg  BID and Spironolactone 25mg  daily, will need to follow up with PCP.  Dysphagia - SLP following - Recommend Dys 3 diet  Parkinson's disease  - Continue rasagiline and rotigotine   Hypothyroidism  -TSH 7  -Synthroid 178mcg   Hypercholesterolemia  - Crestor 10mg    Normocytic anemia and Thrombocytopenia  - Hgb stable, platelets improved. No evidence of microangiopathic hemolysis.  - Follow up with outpatient hematologist.   Transaminitis  - Very mild, U/S of liver c/w fatty infiltration of liver, I feel this is likely a better explaination of her very mild increase in LFTs. Will have patient continue Crestor 10mg  and follow up with PCP  Dispo: Discharge to SNF when bed ready  The patient does have a current PCP Elba Barman, MD) and does not need an [facility] hospital follow-up appointment after discharge.  The patient does not have transportation limitations that  hinder transportation to clinic appointments.  .Services Needed at time of discharge: Y = Yes, Blank = No PT: y  OT: y  RN:   Equipment:   Other:     LOS: 5 days   Lucious Groves, DO 12/26/2013, 1:20 PM

## 2013-12-26 NOTE — Progress Notes (Signed)
Patient has c/o of sharp pain 8/10 sternum, patient states that is stated after her meal. HR 82. Made Dr. Randell Patient aware, received order for EKG. Oval Linsey, RN

## 2013-12-27 ENCOUNTER — Inpatient Hospital Stay (HOSPITAL_COMMUNITY): Payer: Medicare Other

## 2013-12-27 LAB — TROPONIN I: Troponin I: <0.3 ng/mL (ref <0.30)

## 2013-12-27 MED ORDER — SPIRONOLACTONE 25 MG PO TABS: 25.0000 mg | ORAL_TABLET | Freq: Every day | ORAL | Status: DC

## 2013-12-27 NOTE — Procedures (Signed)
Objective Swallowing Evaluation: Modified Barium Swallowing Study  Patient Details  Name: GENEAL HUEBERT MRN: [id number] Date of Birth: May 17, 1925  Today's Date: 12/27/2013 Time: 2458-0998 SLP Time Calculation (min): 22 min  Past Medical History:  Past Medical History  Diagnosis Date  . Hypertension   . Parkinson's disease   . Hyperlipidemia   . Dizziness   . Orthostatic hypotension   . Anemia   . Tremor   . Breast cancer     LEFT BREAST  . Kidney failure     stage 3  . Hypothyroidism   . Prediabetes    Past Surgical History:  Past Surgical History  Procedure Laterality Date  . Cardiovascular stress test  06/2007    NORMAL  . Breast lumpectomy    . Cholecystectomy    . Tonsillectomy    . Nasal sinus surgery      submucous resection late 1950s   HPI:  27 F with Parkinson's Disease, CKD, HTN, pre-DM who presented to [facility] on 10/16 with likely TIA difficulty in getting words out and right arm felt funny, lasting about one hour and resolved. Subsequently developed severe hypoxic respiratory failure almost certainly 2/2 aspiration during MRI. Intubated from 10/16 to 10/17. MRI showed no acute finding. Pt reproted in chart to have a history of dysphagia and drinks nectar thick liquids at home but no notes from SLP are available.      Assessment / Plan / Recommendation Clinical Impression  Dysphagia Diagnosis: Mild pharyngeal phase dysphagia Clinical impression: Pt demonstrates a mild pharyngeal dysphagia with normal age related swallow changes. Mild penetration of thin liquids is consistent and likely due to incomplete laryngeal closure during the swallow related to reduced base of tongue retraction and epiglottic deflection. SLP provided max verbal cues for chin tuck which is effective in preventing aspiration, but requires full supervision and maximal cueing. Penetrate is also cleared with a throat clear and second swallow. Mild residue is retained in the valleculae with solids  and liquids, also cleared with a second swallow. Independent consumption of nectar thick liquids is also effective in preventing aspiration. Aspiration risk may be higher at times given report of fluctuating respiratory status from daughter. Given stable respiratory function during exam, favor consumption of thin liquids into long term. However, family may choose more conservative diet to avoid related risks. Will discuss with pt's daughter.    Treatment Recommendation  Therapy as outlined in treatment plan below    Diet Recommendation Regular;Thin liquid   Liquid Administration via: Straw Medication Administration: Whole meds with liquid Supervision: Full supervision/cueing for compensatory strategies Compensations: Slow rate;Small sips/bites;Clear throat after each swallow Postural Changes and/or Swallow Maneuvers: Seated upright 90 degrees    Other  Recommendations Oral Care Recommendations: Oral care BID   Follow Up Recommendations  Inpatient Rehab    Frequency and Duration min 2x/week  2 weeks   Pertinent Vitals/Pain none    SLP Swallow Goals     General Date of Onset: 12/21/13 HPI: 23 F with Parkinson's Disease, CKD, HTN, pre-DM who presented to [facility] on 10/16 with likely TIA difficulty in getting words out and right arm felt funny, lasting about one hour and resolved. Subsequently developed severe hypoxic respiratory failure almost certainly 2/2 aspiration during MRI. Intubated from 10/16 to 10/17. MRI showed no acute finding. Pt reproted in chart to have a history of dysphagia and drinks nectar thick liquids at home but no notes from SLP are available.  Type of Study: Modified Barium Swallowing Study  Reason for Referral: Objectively evaluate swallowing function Previous Swallow Assessment: MBS at [facility] in September - regular textures, nectar thick liquids with a chin tuck and a straw Diet Prior to this Study: Dysphagia 3 (soft);Nectar-thick liquids Temperature  Spikes Noted: No Respiratory Status: Room air History of Recent Intubation: Yes Length of Intubations (days): 2 days Date extubated: 12/22/13 Behavior/Cognition: Alert;Cooperative;Requires cueing Oral Cavity - Dentition: Adequate natural dentition Oral Motor / Sensory Function: Within functional limits Self-Feeding Abilities: Able to feed self Patient Positioning: Upright in chair Baseline Vocal Quality: Clear Volitional Cough: Strong Volitional Swallow: Able to elicit Anatomy: Within functional limits    Reason for Referral Objectively evaluate swallowing function   Oral Phase Oral Preparation/Oral Phase Oral Phase: WFL   Pharyngeal Phase Pharyngeal Phase Pharyngeal Phase: Impaired Pharyngeal - Nectar Pharyngeal - Nectar Straw: Pharyngeal residue - valleculae;Pharyngeal residue - pyriform sinuses Pharyngeal - Thin Pharyngeal - Thin Straw: Delayed swallow initiation;Premature spillage to valleculae;Premature spillage to pyriform sinuses;Penetration/Aspiration after swallow;Trace aspiration;Compensatory strategies attempted (Comment) (chin tuck) Penetration/Aspiration details (thin straw): Material enters airway, CONTACTS cords and not ejected out Pharyngeal - Solids Pharyngeal - Puree: Pharyngeal residue - valleculae Pharyngeal - Regular: Pharyngeal residue - valleculae  Cervical Esophageal Phase    GO              Eden Emms 12/27/2013, 11:33 AM

## 2013-12-27 NOTE — Progress Notes (Signed)
Supervised and reviewed by Dajiah Kooi MA CCC-SLP  

## 2013-12-27 NOTE — Progress Notes (Signed)
  I have seen and examined the patient, and reviewed the daily progress note by Jake Bathe, MS 4 and discussed the care of the patient with them. Please see my progress note from 12/27/2013 for further details regarding assessment and plan.    Signed:  Lucious Groves, DO 12/27/2013, 11:44 AM

## 2013-12-27 NOTE — Progress Notes (Signed)
Subjective: Overnight patient had episode of chest pain which improved after GI cocktail.  EKG done wnl, troponin's trended (neg x2 so far). Also with some SOB and mild wheezing overnight, treated with duoneb with improvement. This morning patient complained of some difficulty speaking she felt this was due to a dry mouth,  After drinking some water she reports that her symptoms resolved. Objective: Vital signs in last 24 hours: Filed Vitals:   12/26/13 1425 12/26/13 1941 12/26/13 2229 12/27/13 0543  BP: 146/55 176/54  135/45  Pulse: 73 79  72  Temp: 97.8 F (36.6 C) 98.1 F (36.7 C)  98.4 F (36.9 C)  TempSrc: Oral Oral  Oral  Resp: 18     Height:      Weight:    120 lb 9.5 oz (54.7 kg)  SpO2: 98% 99% 96% 94%   Weight change: 4 lb 3 oz (1.9 kg)  Intake/Output Summary (Last 24 hours) at 12/27/13 0737 Last data filed at 12/26/13 2246  Gross per 24 hour  Intake    123 ml  Output      0 ml  Net    123 ml   General: resting in bed  Cardiac: RRR, no murmurs  Pulm: CTAB no wheezing or rales  Abd: soft, nontender, nondistended, BS present  Ext:  no pedal edema  Neuro: AAO3, CN2-12 intact, 5/5 upper extremity and lower extremity strength.  Lab Results: Basic Metabolic Panel:  Recent Labs Lab 12/25/13 0844 12/26/13 0555  NA 139 139  K 4.0 4.4  CL 101 103  CO2 26 26  GLUCOSE 134* 97  BUN 31* 32*  CREATININE 0.63 0.68  CALCIUM 9.2 8.8   Liver Function Tests:  Recent Labs Lab 12/22/13 0239 12/25/13 0844  AST 58* 67*  ALT 57* 51*  ALKPHOS 136* 128*  BILITOT 0.4 0.5  PROT 6.9 6.8  ALBUMIN 3.0* 3.2*   No results found for this basename: LIPASE, AMYLASE,  in the last 168 hours No results found for this basename: AMMONIA,  in the last 168 hours CBC:  Recent Labs Lab 12/21/13 1514  12/24/13 0309 12/26/13 0555  WBC 4.8  < > 5.0 5.4  NEUTROABS 2.6  --   --   --   HGB 11.1*  < > 10.6* 10.1*  HCT 33.9*  < > 32.4* 31.3*  MCV 95.5  < > 95.6 98.1  PLT 85*  <  > 107* 126*  < > = values in this interval not displayed. Cardiac Enzymes:  Recent Labs Lab 12/26/13 2239 12/27/13 0321  TROPONINI <0.30 <0.30   BNP: No results found for this basename: PROBNP,  in the last 168 hours D-Dimer: No results found for this basename: DDIMER,  in the last 168 hours CBG:  Recent Labs Lab 12/22/13 2011 12/22/13 2357 12/23/13 0401 12/23/13 0812 12/23/13 1203 12/23/13 1638  GLUCAP 91 91 82 80 94 211*   Hemoglobin A1C:  Recent Labs Lab 12/21/13 1940  HGBA1C 6.4*   Fasting Lipid Panel:  Recent Labs Lab 12/22/13 0239  CHOL 99  HDL 41  LDLCALC 31  TRIG 133  CHOLHDL 2.4   Thyroid Function Tests:  Recent Labs Lab 12/21/13 1940  TSH 7.040*   Coagulation:  Recent Labs Lab 12/21/13 1351 12/21/13 2300  LABPROT 14.7 15.3*  INR 1.14 1.19   Anemia Panel:  Recent Labs Lab 12/21/13 1940  VITAMINB12 753  FOLATE >20.0  FERRITIN 145  TIBC 219*  IRON 68  RETICCTPCT 2.1  Urine Drug Screen: Drugs of Abuse  No results found for this basename: labopia,  cocainscrnur,  labbenz,  amphetmu,  thcu,  labbarb    Alcohol Level: No results found for this basename: ETH,  in the last 168 hours Urinalysis:  Recent Labs Lab 12/21/13 1650  COLORURINE YELLOW  LABSPEC 1.016  PHURINE 6.5  GLUCOSEU NEGATIVE  HGBUR MODERATE*  BILIRUBINUR NEGATIVE  KETONESUR NEGATIVE  PROTEINUR 100*  UROBILINOGEN 1.0  NITRITE NEGATIVE  LEUKOCYTESUR NEGATIVE    Micro Results: No results found for this or any previous visit (from the past 240 hour(s)). Studies/Results: No results found. Medications: I have reviewed the patient's current medications. Scheduled Meds: . acidophilus  1 capsule Oral Daily  . aspirin EC  81 mg Oral Daily  . cephALEXin  250 mg Oral QHS  . clopidogrel  75 mg Oral Daily  . docusate sodium  100 mg Oral Daily  . folic acid  1 mg Oral Daily  . levothyroxine  100 mcg Oral QAC breakfast  . metoprolol  75 mg Oral BID  .  rasagiline  1 mg Oral Daily  . rosuvastatin  10 mg Oral q1800  . rotigotine  2 patch Transdermal Daily  . sodium chloride  3 mL Intravenous Q12H  . spironolactone  25 mg Oral Daily   Continuous Infusions:  PRN Meds:.hydrALAZINE, metoprolol, RESOURCE THICKENUP CLEAR Assessment/Plan: TIA (transient ischemic attack)  - Patient has completed workup by Neurology. - Continue Antiplatelet therapy with 81mg  ASA and 75mg  plavix  -Lipid panel shows LDL of 31 on Crestor 10mg .  -Will need 30 day cardiac monitor on discharge  - Discharge to SNF today  Chest pain - Likely secondary to gastritis, low suspicion for ACS but will follow up third tropronin prior to discharge.  HTN  - BP 135/45 this AM on Metoprolol 75mg  BID and Spironolactone 25mg  daily, will need to follow up with PCP.  Dysphagia - SLP following, plan for repeat MBS this morning. - Recommend Dys 3 diet - Repeat swallowing study read pending  Parkinson's disease  - Continue rasagiline and rotigotine   Hypothyroidism  -TSH 7  -Synthroid 176mcg   Hypercholesterolemia  - Crestor 10mg    Normocytic anemia and Thrombocytopenia (Thrombocytopenia improved. - Hgb stable, platelets improved. No evidence of microangiopathic hemolysis.  - Follow up with outpatient hematologist.   Transaminitis  - Very mild, U/S of liver c/w fatty infiltration of liver, I feel this is likely a better explaination of her very mild increase in LFTs. Will have patient continue Crestor 10mg  and follow up with PCP  Dispo: Discharge to SNF when bed ready  The patient does have a current PCP Elba Barman, MD) and does not need an [facility] hospital follow-up appointment after discharge.  The patient does not have transportation limitations that hinder transportation to clinic appointments.  .Services Needed at time of discharge: Y = Yes, Blank = No PT: y  OT: y  RN:   Equipment:   Other:     LOS: 6 days   Lucious Groves, DO 12/27/2013, 7:37  AM

## 2013-12-27 NOTE — Procedures (Signed)
Supervised and reviewed by Kino Dunsworth MA CCC-SLP  

## 2013-12-27 NOTE — Progress Notes (Signed)
CSW (Clinical Education officer, museum) received notification from pt daughter that they would like to accept bed at [facility]. CSW confirmed with facility they can accept pt for dc today.  [place], [place]

## 2013-12-27 NOTE — Progress Notes (Signed)
Patient states that she feels better, denies pain at this time.Wheezing remains decreased. Will continue to monitor. Oval Linsey, RN

## 2013-12-27 NOTE — Progress Notes (Signed)
CSW (Clinical Social Worker) prepared pt dc packet and placed with shadow chart. CSW arranged non-emergent ambulance transport. Pt, pt family, pt nurse, and facility informed. CSW signing off.  Kenda Kloehn, LCSWA [phone]  

## 2013-12-27 NOTE — Progress Notes (Signed)
  Date: 12/27/2013  Patient name: Victoria Lindsey  Medical record number: [id number]  Date of birth: 12/24/1925   This patient has been seen and the plan of care was discussed with the house staff. Please see their note for complete details. I concur with their findings with the following additions/corrections:  To SNF today.  MBS done.  Chest pain resolved with GI cocktail and CE X 3 negative.  No further neurological symptoms.   Sid Falcon, MD 12/27/2013, 3:29 PM

## 2013-12-27 NOTE — Discharge Instructions (Addendum)
Please continue you medications as listed on this paper.  The most notable change is taking Spironolactone 25mg  a day.  Please follow up with you primary doctor as well as Dr. Erlinda Hong (Neurology), we will send the discharge summary to your PCP, Cardiologist, and Hematologist.Exercise Guidelines During Cardiac Rehabilitation During cardiac rehabilitation, it is important to do more aerobic activities. Even simple lifestyle changes can help, such as parking farther from the store or taking the stairs instead of the elevator.  Discuss an appropriate exercise program with your cardiologist and rehabilitation therapist. It is important to design a program that is safe and effective for you. The program should meet your specific abilities and needs. Walking, biking, jogging, and swimming are all good aerobic activities. These take light to moderate effort. Adding some light resistance training is also important.  STRETCHING Stretching before you exercise warms up your muscles and prevents injury. Stretching also improves your flexibility, balance, coordination, and range of motion. Stretch both before and after exercising. Do not force a muscle or joint into a painful angle. Stretching should be a relaxing part of your exercise routine. As soon as you feel resistance, hold the position and count to 10. Go slowly when doing all stretches. TYPES OF EXERCISE Aerobic Exercise Aerobic exercise keeps joints and muscles moving. It involves large muscle groups. It is also rhythmic in nature and done for a longer period. Doing these exercises improves circulation and endurance. Examples of aerobic exercise include: Swimming. Walking. Hiking. Jogging. Cross-country skiing. Biking. Static Exercise Static exercise uses muscles at high intensities without moving the joints. Pushing against a heavy couch that does not move is an example of static exercise. Static exercise improves strength but also quickly increases  blood pressure. Therefore, you need to follow these guidelines: Do not hold your breath while doing static exercises. Holding your breath during static exercises can raise your blood pressure to a dangerously high level. Do not do static exercises if you have circulation problems or high blood pressure. Weight-Resistance Exercise Weight-resistance exercises are another important part of rehabilitation. These exercises strengthen your muscles by making them work against resistance. Examples include using free weights, weight-lifting machines, and large, specially designed rubber bands. Resistance exercises may help you return to activities of daily living sooner and improve your quality of life. They also help reduce cardiac risk factors. You will usually do weight-resistance exercises twice a week with a 2-day rest period between workouts. SETTING A PACE  Choose a pace that is comfortable for you. You should be able to talk with a friend while exercising. Slow down if you are short of breath or unable to speak while you exercise. Keep track of how hard you are working as you exercise. Your rehabilitation therapist can teach you to use a scale to measure your level of exertion. Use this scale to make sure you are exercising at a level that is safe and effective for you. Cardiac rehabilitation and wellness facilities use numerical scales to rate your level of exertion. These scales usually rate your exertion level in a range from 6 to 20. A rating of 6 means that you are not exerting yourself at all. A rating of 20 indicates that you are working very hard. For a healthy exercise session, your exertion rate goal should be between 11 and 15. Document Released: 02/27/2013 Document Revised: 07/09/2013 Document Reviewed: 02/27/2013 [facility] Patient Information 2015 [place], [place]. This information is not intended to replace advice given to you by your health care  provider. Make sure you discuss any questions you  have with your health care provider. Hypertension Hypertension, commonly called high blood pressure, is when the force of blood pumping through your arteries is too strong. Your arteries are the blood vessels that carry blood from your heart throughout your body. A blood pressure reading consists of a higher number over a lower number, such as 110/72. The higher number (systolic) is the pressure inside your arteries when your heart pumps. The lower number (diastolic) is the pressure inside your arteries when your heart relaxes. Ideally you want your blood pressure below 120/80. Hypertension forces your heart to work harder to pump blood. Your arteries may become narrow or stiff. Having hypertension puts you at risk for heart disease, stroke, and other problems.  RISK FACTORS Some risk factors for high blood pressure are controllable. Others are not.  Risk factors you cannot control include:   Race. You may be at higher risk if you are African American.  Age. Risk increases with age.  Gender. Men are at higher risk than women before age 50 years. After age 55, women are at higher risk than men. Risk factors you can control include:  Not getting enough exercise or physical activity.  Being overweight.  Getting too much fat, sugar, calories, or salt in your diet.  Drinking too much alcohol. SIGNS AND SYMPTOMS Hypertension does not usually cause signs or symptoms. Extremely high blood pressure (hypertensive crisis) may cause headache, anxiety, shortness of breath, and nosebleed. DIAGNOSIS  To check if you have hypertension, your health care provider will measure your blood pressure while you are seated, with your arm held at the level of your heart. It should be measured at least twice using the same arm. Certain conditions can cause a difference in blood pressure between your right and left arms. A blood pressure reading that is higher than normal on one occasion does not mean that you need  treatment. If one blood pressure reading is high, ask your health care provider about having it checked again. TREATMENT  Treating high blood pressure includes making lifestyle changes and possibly taking medicine. Living a healthy lifestyle can help lower high blood pressure. You may need to change some of your habits. Lifestyle changes may include:  Following the DASH diet. This diet is high in fruits, vegetables, and whole grains. It is low in salt, red meat, and added sugars.  Getting at least 2 hours of brisk physical activity every week.  Losing weight if necessary.  Not smoking.  Limiting alcoholic beverages.  Learning ways to reduce stress. If lifestyle changes are not enough to get your blood pressure under control, your health care provider may prescribe medicine. You may need to take more than one. Work closely with your health care provider to understand the risks and benefits. HOME CARE INSTRUCTIONS  Have your blood pressure rechecked as directed by your health care provider.   Take medicines only as directed by your health care provider. Follow the directions carefully. Blood pressure medicines must be taken as prescribed. The medicine does not work as well when you skip doses. Skipping doses also puts you at risk for problems.   Do not smoke.   Monitor your blood pressure at home as directed by your health care provider. SEEK MEDICAL CARE IF:   You think you are having a reaction to medicines taken.  You have recurrent headaches or feel dizzy.  You have swelling in your ankles.  You have trouble with  your vision. SEEK IMMEDIATE MEDICAL CARE IF:  You develop a severe headache or confusion.  You have unusual weakness, numbness, or feel faint.  You have severe chest or abdominal pain.  You vomit repeatedly.  You have trouble breathing. MAKE SURE YOU:   Understand these instructions.  Will watch your condition.  Will get help right away if you are  not doing well or get worse. Document Released: 02/22/2005 Document Revised: 07/09/2013 Document Reviewed: 12/15/2012 [facility] Patient Information 2015 [place], [place]. This information is not intended to replace advice given to you by your health care provider. Make sure you discuss any questions you have with your health care provider. Stroke Prevention Some medical conditions and behaviors are associated with an increased chance of having a stroke. You may prevent a stroke by making healthy choices and managing medical conditions. HOW CAN I REDUCE MY RISK OF HAVING A STROKE?   Stay physically active. Get at least 30 minutes of activity on most or all days.  Do not smoke. It may also be helpful to avoid exposure to secondhand smoke.  Limit alcohol use. Moderate alcohol use is considered to be:  No more than 2 drinks per day for men.  No more than 1 drink per day for nonpregnant women.  Eat healthy foods. This involves:  Eating 5 or more servings of fruits and vegetables a day.  Making dietary changes that address high blood pressure (hypertension), high cholesterol, diabetes, or obesity.  Manage your cholesterol levels.  Making food choices that are high in fiber and low in saturated fat, trans fat, and cholesterol may control cholesterol levels.  Take any prescribed medicines to control cholesterol as directed by your health care provider.  Manage your diabetes.  Controlling your carbohydrate and sugar intake is recommended to manage diabetes.  Take any prescribed medicines to control diabetes as directed by your health care provider.  Control your hypertension.  Making food choices that are low in salt (sodium), saturated fat, trans fat, and cholesterol is recommended to manage hypertension.  Take any prescribed medicines to control hypertension as directed by your health care provider.  Maintain a healthy weight.  Reducing calorie intake and making food choices that  are low in sodium, saturated fat, trans fat, and cholesterol are recommended to manage weight.  Stop drug abuse.  Avoid taking birth control pills.  Talk to your health care provider about the risks of taking birth control pills if you are over 50 years old, smoke, get migraines, or have ever had a blood clot.  Get evaluated for sleep disorders (sleep apnea).  Talk to your health care provider about getting a sleep evaluation if you snore a lot or have excessive sleepiness.  Take medicines only as directed by your health care provider.  For some people, aspirin or blood thinners (anticoagulants) are helpful in reducing the risk of forming abnormal blood clots that can lead to stroke. If you have the irregular heart rhythm of atrial fibrillation, you should be on a blood thinner unless there is a good reason you cannot take them.  Understand all your medicine instructions.  Make sure that other conditions (such as anemia or atherosclerosis) are addressed. SEEK IMMEDIATE MEDICAL CARE IF:   You have sudden weakness or numbness of the face, arm, or leg, especially on one side of the body.  Your face or eyelid droops to one side.  You have sudden confusion.  You have trouble speaking (aphasia) or understanding.  You have sudden trouble  seeing in one or both eyes.  You have sudden trouble walking.  You have dizziness.  You have a loss of balance or coordination.  You have a sudden, severe headache with no known cause.  You have new chest pain or an irregular heartbeat. Any of these symptoms may represent a serious problem that is an emergency. Do not wait to see if the symptoms will go away. Get medical help at once. Call your local emergency services (911 in [place] Do not drive yourself to the hospital. Document Released: 04/01/2004 Document Revised: 07/09/2013 Document Reviewed: 08/25/2012 [facility] Patient Information 2015 [place], [place]. This information is not intended to  replace advice given to you by your health care provider. Make sure you discuss any questions you have with your health care provider.

## 2013-12-27 NOTE — Progress Notes (Signed)
Speech Language Pathology Treatment: Dysphagia (dysphagia education)  Patient Details Name: Victoria Lindsey MRN: [id number] DOB: 08-01-1925 Today's Date: 12/27/2013 Time: 4650-3546 SLP Time Calculation (min): 39 min  Assessment / Plan / Recommendation Clinical Impression  SLP provided education and explaination regarding the findings of MBS to the pt, her husband, and daughter. Diet recommendations and options were reviewed. The SLP educated the family about aspiration risks and precautions. Esophageal precautions including sitting upright when eating and staying upright after meals were discussed. The SLP made suggesions regarding raising the head of the pt's bed to help with reflux as well. Suggest follow up for respiratory and gastrointestional issues reported by daughter. Due to families preference for more conservative diet, recommend dysphagia 3(mechanical soft) with nectar thick liquids. SLP to follow up for diet tolerance.   HPI HPI: 89 F with Parkinson's Disease, CKD, HTN, pre-DM who presented to [facility] on 10/16 with likely TIA difficulty in getting words out and right arm felt funny, lasting about one hour and resolved. Subsequently developed severe hypoxic respiratory failure almost certainly 2/2 aspiration during MRI. Intubated from 10/16 to 10/17. MRI showed no acute finding. Pt reproted in chart to have a history of dysphagia and drinks nectar thick liquids at home but no notes from SLP are available.    Pertinent Vitals Pain Assessment: No/denies pain  SLP Plan  Continue with current plan of care    Recommendations Diet recommendations: Dysphagia 3 (mechanical soft);Nectar-thick liquid Liquids provided via: Straw;Cup Medication Administration: Whole meds with liquid Supervision: Intermittent supervision to cue for compensatory strategies Compensations: Slow rate;Small sips/bites Postural Changes and/or Swallow Maneuvers: Seated upright 90 degrees;Upright 30-60 min after meal             Oral Care Recommendations: Oral care BID Follow up Recommendations: Inpatient Rehab Plan: Continue with current plan of care    GO     Eden Emms 12/27/2013, 12:35 PM

## 2013-12-27 NOTE — Progress Notes (Signed)
Subjective: Patient had an episodes of chest pain last night after eating. States it felt like her typical indigestion pain. Pain resolved with GI cocktail. Also had some wheezing which resolved with albuterol. No complaints this morning other than dry mouth, however patient felt better after drinking some water. D/c to SNF today. Results of modified barium swallow pending.  Objective: Vital signs in last 24 hours: Filed Vitals:   12/26/13 1941 12/26/13 2229 12/27/13 0543 12/27/13 0800  BP: 176/54  135/45 141/47  Pulse: 79  72 70  Temp: 98.1 F (36.7 C)  98.4 F (36.9 C)   TempSrc: Oral  Oral   Resp:    18  Height:      Weight:   54.7 kg (120 lb 9.5 oz)   SpO2: 99% 96% 94% 94%   Weight change: 1.9 kg (4 lb 3 oz)  Intake/Output Summary (Last 24 hours) at 12/27/13 1056 Last data filed at 12/27/13 0834  Gross per 24 hour  Intake    243 ml  Output      0 ml  Net    243 ml   Physical Exam: General: Patient is thin-appearing, in no acute distress and cooperative with exam.  Cardiovascular: RRR, S1 normal, S2 normal, faint systolic murmur  Pulmonary/Chest: CTAB, respirations unlabored Abdominal: Soft, nontender, nondistended Neuro: Alert and oriented x3, EOMI, Strength and sensation intact throughout, CN 2-12 intact  Extremities: no edema Skin: Warm, dry and intact. No rashes or erythema. Psychiatric: Normal mood and affect. speech and behavior is normal. Cognition and memory are normal.   Lab Results: Results for orders placed during the hospital encounter of 12/21/13 (from the past 24 hour(s))  TROPONIN I     Status: None   Collection Time    12/26/13 10:39 PM      Result Value Ref Range   Troponin I <0.30  <0.30 ng/mL  TROPONIN I     Status: None   Collection Time    12/27/13  3:21 AM      Result Value Ref Range   Troponin I <0.30  <0.30 ng/mL   Micro Results: No results found for this or any previous visit (from the past 240 hour(s)). Studies/Results: No results  found. Medications: I have reviewed the patient's current medications. Scheduled Meds: . acidophilus  1 capsule Oral Daily  . aspirin EC  81 mg Oral Daily  . cephALEXin  250 mg Oral QHS  . clopidogrel  75 mg Oral Daily  . docusate sodium  100 mg Oral Daily  . folic acid  1 mg Oral Daily  . levothyroxine  100 mcg Oral QAC breakfast  . metoprolol  75 mg Oral BID  . rasagiline  1 mg Oral Daily  . rosuvastatin  10 mg Oral q1800  . rotigotine  2 patch Transdermal Daily  . sodium chloride  3 mL Intravenous Q12H  . spironolactone  25 mg Oral Daily   Continuous Infusions:  PRN Meds:.hydrALAZINE, metoprolol, RESOURCE THICKENUP CLEAR Assessment/Plan: Principal Problem:   TIA (transient ischemic attack) Active Problems:   Benign hypertensive heart disease without heart failure   Parkinson's disease   Hypothyroidism   Hypercholesterolemia   Sjogren's syndrome   Dyspnea   Normocytic anemia   Thrombocytopenia   Respiratory insufficiency   Transaminitis  Acute respiratory failure - resolved. Extubated 10/17. Patient sating 100% on room air, lungs CTAB. Wheezing resolved with albuterol nebulizers. Likely secondary to aspiration pneumonitis vs. Flash pulmonary edema.  -Dysphagia 1 diet per speech therapy  to prevent aspiration  TIA (transient ischemic attack): Patient presented with 1 hr of slurred speech and R hand tingling, now resolved. CT, MRI negative. -MRA cancelled as patient did not tolerate MRI  - Carotid dopplers 0-40% stenosis - A1c and Lipid panel normal - Continue ASA and Plavix for antiplatelet therapy.  - continue PT, will d/c today or tomorrow to inpatient rehab pending bed availability   Recurrent UTIs -continue home Keflex  HTN: BP improved with systolics [id number]. - Metoprolol 75mg  BID -Spirinolactone to 25mg  daily -Hydralazine PRN for SBP >170 mmHg  Parkinson's disease  - Continue rasagiline and rotigotine   Hypothyroidism: TSH 7  -Continue home synthroid    -f/u as outpatient  Hypercholesterolemia  - Continue crestor   Sjogren's syndrome  -Stable   Normocytic anemia and Thrombocytopenia: Hgb stable at 10.6, platelet count improved to 107 from 84. No abnormalities on anemia panel. -F/u with outpatient hemotologist.   Elevated Alk Phos/ALT/AST: Hepatitis panel wnl - RUQ U/S showed likely fatty infiltration of liver  This is a Careers information officer Note.  The care of the patient was discussed with Dr. Heber Harrells and the assessment and plan formulated with their assistance.  Please see their attached note for official documentation of the daily encounter.   LOS: 6 days   Ciro Backer, Med Student 12/27/2013, 10:56 AM

## 2013-12-28 ENCOUNTER — Non-Acute Institutional Stay (SKILLED_NURSING_FACILITY): Payer: Medicare Other | Admitting: Adult Health

## 2013-12-28 DIAGNOSIS — G459 Transient cerebral ischemic attack, unspecified: Secondary | ICD-10-CM

## 2013-12-28 DIAGNOSIS — E7800 Pure hypercholesterolemia, unspecified: Secondary | ICD-10-CM

## 2013-12-28 DIAGNOSIS — E039 Hypothyroidism, unspecified: Secondary | ICD-10-CM

## 2013-12-28 DIAGNOSIS — G20 Parkinson's disease: Secondary | ICD-10-CM

## 2013-12-28 DIAGNOSIS — I10 Essential (primary) hypertension: Secondary | ICD-10-CM

## 2013-12-28 DIAGNOSIS — M3500 Sicca syndrome, unspecified: Secondary | ICD-10-CM

## 2013-12-28 MED FILL — Rotigotine TD Patch 24HR 4 MG/24HR: TRANSDERMAL | Qty: 2 | Status: AC

## 2013-12-30 ENCOUNTER — Encounter: Payer: Self-pay | Admitting: Adult Health

## 2013-12-30 DIAGNOSIS — I110 Hypertensive heart disease with heart failure: Secondary | ICD-10-CM | POA: Insufficient documentation

## 2013-12-30 NOTE — Progress Notes (Signed)
Patient ID: Victoria Lindsey, female   DOB: [birth year], 80 y.o.   MRN: [id number]   12/30/2013  Facility:  [facility]ursing Home Location:  [place] Room Number: 1206-P LEVEL OF CARE:  SNF (31)   Chief Complaint  Patient presents with  . New Admit To SNF    HISTORY OF PRESENT ILLNESS:  This is an 80-=year-old female who has been admitted to [facility] on 12/27/13 from [facility] with acute respiratory failure. She has been admitted for a short-term rehabilitation.  REASSESSMENT OF ONGOING PROBLEMS:  HTN: Pt 's  Denies CP, sob, DOE, pedal edema, headaches, dizziness or visual disturbances.  No complications from the medications currently being used.  Last BP : 149/66  HYPERLIPIDEMIA: No complications from the medications presently being used. 10/15 fasting lipid panel showed : cholesterol 99  HDL 41  LDL 31  PARKINSON'S DISEASE: pt's Parkinson's disease is stable.  Denies progression of sx recently.  Pt is tolerating Parkinson's disease medications without any complications.  PAST MEDICAL HISTORY:  Past Medical History  Diagnosis Date  . Hypertension   . Parkinson's disease   . Hyperlipidemia   . Dizziness   . Orthostatic hypotension   . Anemia   . Tremor   . Breast cancer     LEFT BREAST  . Kidney failure     stage 3  . Hypothyroidism   . Prediabetes     CURRENT MEDICATIONS: Reviewed per MAR/see medication list  Allergies  Allergen Reactions  . Aciphex [Rabeprazole Sodium]   . Amantadines   . Avapro [Irbesartan]   . Carbidopa-Levodopa   . Cardura [Doxazosin Mesylate]   . Ciprofloxacin   . Clonidine Derivatives   . Cozaar   . Diltiazem   . Famotidine   . Hctz [Hydrochlorothiazide]   . Indapamide   . Lansoprazole   . Lisinopril     Mouth problems  . Loratadine   . Maxidex [Dexamethasone]   . Mirapex [Pramipexole Dihydrochloride]   . Norvasc [Amlodipine Besylate]   . Pantoprazole Sodium   . Penicillins   . Prilosec  [Omeprazole]   . Procardia [Nifedipine]   . Proton Pump Inhibitors   . Sulfa Drugs Cross Reactors   . Trimethoprim   . Zyrtec [Cetirizine Hcl]      REVIEW OF SYSTEMS:  GENERAL: no change in appetite, no fatigue, no weight changes, no fever, chills or weakness RESPIRATORY: no cough, SOB, DOE, wheezing, hemoptysis CARDIAC: no chest pain, edema or palpitations GI: no abdominal pain, diarrhea, constipation, heart burn, nausea or vomiting  PHYSICAL EXAMINATION  GENERAL: no acute distress, normal body habitus EYES: conjunctivae normal, sclerae normal, normal eye lids NECK: supple, trachea midline, no neck masses, no thyroid tenderness, no thyromegaly LYMPHATICS: no LAN in the neck, no supraclavicular LAN RESPIRATORY: breathing is even & unlabored, BS CTAB CARDIAC: RRR, no murmur,no extra heart sounds, no edema GI: abdomen soft, normal BS, no masses, no tenderness, no hepatomegaly, no splenomegaly EXTREMITIES: able to move all 4 extremities PSYCHIATRIC: the patient is alert & oriented to person, affect & behavior appropriate  LABS/RADIOLOGY: Labs reviewed: Basic Metabolic Panel:  Recent Labs  12/24/13 0309 12/25/13 0844 12/26/13 0555  NA 136* 139 139  K 4.0 4.0 4.4  CL 99 101 103  CO2 26 26 26   GLUCOSE 113* 134* 97  BUN 31* 31* 32*  CREATININE 0.68 0.63 0.68  CALCIUM 9.1 9.2 8.8   Liver Function Tests:  Recent Labs  12/21/13 2300 12/22/13  0239 12/25/13 0844  AST 56* 58* 67*  ALT 55* 57* 51*  ALKPHOS 135* 136* 128*  BILITOT 0.3 0.4 0.5  PROT 6.7 6.9 6.8  ALBUMIN 2.9* 3.0* 3.2*   CBC:  Recent Labs  10/18/13 1436 12/21/13 1514  12/22/13 0239 12/24/13 0309 12/26/13 0555  WBC 5.6 4.8  < > 7.9 5.0 5.4  NEUTROABS 3.0 2.6  --   --   --   --   HGB 11.2* 11.1*  < > 10.9* 10.6* 10.1*  HCT 33.3* 33.9*  < > 33.7* 32.4* 31.3*  MCV 97.5 95.5  < > 97.7 95.6 98.1  PLT 142.0* 85*  < > 115* 107* 126*  < > = values in this interval not displayed.  Lipid  Panel:  Recent Labs  12/22/13 0239  HDL 41   Cardiac Enzymes:  Recent Labs  12/26/13 2239 12/27/13 0321 12/27/13 1210  TROPONINI <0.30 <0.30 <0.30   CBG:  Recent Labs  12/23/13 0812 12/23/13 1203 12/23/13 1638  GLUCAP 80 94 211*    Dg Chest 2 View  12/21/2013   CLINICAL DATA:  Hypertension.  Headache and dizziness  EXAM: CHEST  2 VIEW  COMPARISON:  11/21/2013  FINDINGS: Cardiac enlargement without heart failure. Elevated left hemidiaphragm and left lower lobe atelectasis/scarring unchanged. Negative for pneumonia. Negative for pleural effusion  Moderate to severe chronic compression fracture approximately L1 unchanged from the prior study.  IMPRESSION: Chronic lung disease is stable.  No superimposed acute abnormality.   Electronically Signed   By: Franchot Gallo M.D.   On: 12/21/2013 14:26   Ct Head Wo Contrast  12/21/2013   CLINICAL DATA:  Parkinson's.  Recent fall.  Fell and hit head today  EXAM: CT HEAD WITHOUT CONTRAST  TECHNIQUE: Contiguous axial images were obtained from the base of the skull through the vertex without intravenous contrast.  COMPARISON:  MRI 04/12/2012  FINDINGS: Generalized atrophy. Mild chronic microvascular ischemic change in the white matter.  Negative for acute infarct.  Negative for hemorrhage or mass.  Negative for skull fracture. Atherosclerotic disease in the cavernous carotid bilaterally. Mucosal thickening left sphenoid sinus.  IMPRESSION: Atrophy and chronic microvascular ischemia.  No acute abnormality   Electronically Signed   By: Franchot Gallo M.D.   On: 12/21/2013 15:48   Ct Angio Chest W/cm &/or Wo Cm  12/04/2013   CLINICAL DATA:  Increased shortness of breath. Fall with back fractures in July. History of breast cancer 5 years ago.  EXAM: CT ANGIOGRAPHY CHEST WITH CONTRAST  TECHNIQUE: Multidetector CT imaging of the chest was performed using the standard protocol during bolus administration of intravenous contrast. Multiplanar CT image  reconstructions and MIPs were obtained to evaluate the vascular anatomy.  CONTRAST:  58mL OMNIPAQUE IOHEXOL 350 MG/ML SOLN  COMPARISON:  11/21/2013 chest radiograph  FINDINGS: Breathing motion artifact obscures the lung bases.  Subject to this mild limitation, no filling defect is identified in the pulmonary arterial tree to suggest pulmonary embolus.  Thoracic aortic atherosclerotic calcification noted with some mural thrombus along the descending thoracic aorta. There is likely some degree of coronary atherosclerosis. Mildly enlarged cardiopericardial silhouette. Interventricular septal thickness 1.5 cm, borderline for left ventricular hypertrophy.  Cystic lesion in the left breast 2.8 by 3.5 cm, image 46 series 4. Adjacent pleural thickening and subpleural interstitial accentuation suggesting radiation therapy in the region.  Subacute left anterior third and fourth rib fractures could be posttraumatic or due to the localized radiation therapy causing weakening in the bone. Mild  adjacent pleural thickening noted.  Thoracic spondylosis. Slight superior endplate concavities at T2 and T3 are age indeterminate.  Mild mosaic attenuation in the lungs with equal visibility of vessels in the darker regions. Bilateral airway thickening in the lower lobes.  Review of the MIP images confirms the above findings.  IMPRESSION: 1. No pulmonary embolus is identified. 2. Subacute left anterior third and fourth rib fractures. This is in the vicinity of localized radiation therapy, which may have predispose to fracture. Mild adjacent pleural thickening. 3. Cystic lesion in the left breast 2.8 by 3.5 cm, probably post procedural. 4. Mild superior endplate concavities at T2 and T3, age indeterminate, likely subtle superior endplate compression fractures. 5. Mosaic attenuation in the lungs may be from low-grade alveolar infiltration from edema, or air trapping. There is notable airway thickening in both lower lobes. 6. Atherosclerosis.    Electronically Signed   By: Sherryl Barters M.D.   On: 12/04/2013 15:56   Mr Brain Wo Contrast  12/21/2013   CLINICAL DATA:  Episode of speech disturbance and right-sided weakness.  EXAM: MRI HEAD WITHOUT CONTRAST  TECHNIQUE: Multiplanar, multiecho pulse sequences of the brain and surrounding structures were obtained without intravenous contrast.  COMPARISON:  Head CT same day.  MRI 04/12/2012.  FINDINGS: Diffusion imaging does not show any acute or subacute infarction. The brainstem is normal. There are a few old small vessel cerebellar infarctions. There are a few old small vessel infarctions affecting the thalami basal ganglia. There are moderate chronic small-vessel ischemic changes throughout the deep and subcortical white matter. No cortical or large vessel territory infarction. No mass lesion, hemorrhage, hydrocephalus or extra-axial collection. There is arachnoid herniation into the sella but no pituitary mass. No inflammatory sinus disease. No skull or skullbase lesion.  IMPRESSION: No acute or subacute infarction. Moderate chronic small-vessel ischemic changes throughout the brain as outlined above.   Electronically Signed   By: Nelson Chimes M.D.   On: 12/21/2013 18:44   Dg Chest Port 1 View  12/21/2013   CLINICAL DATA:  Status post intubation.  EXAM: PORTABLE CHEST - 1 VIEW  COMPARISON:  Single view of the chest 12/21/2013 at 1922 hr.  FINDINGS: The patient has a new endotracheal tube in place with the tip in good position at the level of the clavicular heads. NG tube is also in good position with the tip in the stomach. The chest is better expanded with decreased atelectasis. Patchy bilateral airspace disease likely due to edema again seen. Cardiomegaly.  IMPRESSION: ET tube and NG tube project in good position.  Decreased atelectasis with pulmonary edema again seen.   Electronically Signed   By: Inge Rise M.D.   On: 12/21/2013 21:49   Dg Chest Port 1 View  12/21/2013   CLINICAL  DATA:  Acute hypoxia.  EXAM: PORTABLE CHEST - 1 VIEW  COMPARISON:  PA and lateral chest earlier this same day.  FINDINGS: Since the earlier study, extensive bilateral airspace disease has developed. Heart size is enlarged. There is small left pleural effusion. No pneumothorax.  IMPRESSION: New, extensive bilateral airspace disease is likely due to pulmonary edema.   Electronically Signed   By: Inge Rise M.D.   On: 12/21/2013 19:59   Dg Chest Port 1v Same Day  12/21/2013   CLINICAL DATA:  Status post repositioning of endotracheal tube.  EXAM: PORTABLE CHEST - 1 VIEW SAME DAY  COMPARISON:  Single view of the chest 12/21/2013 at 2133 hr  FINDINGS: ET tube is in  place with the tip in good position at the inferior aspect of the clavicular heads. NG tube tip is in the stomach. Patchy bilateral airspace disease and cardiomegaly appear unchanged.  IMPRESSION: ET tube projects in good position.  No change in pulmonary edema.   Electronically Signed   By: Inge Rise M.D.   On: 12/21/2013 22:13   Dg Swallowing Func-speech Pathology  12/27/2013   Eden Emms, Student-SLP     12/27/2013 11:34 AM Objective Swallowing Evaluation: Modified Barium Swallowing Study   Patient Details  Name: ALIRA FRETWELL MRN: [id number] Date of Birth: 14-Jan-1926  Today's Date: 12/27/2013 Time: 6606-3016 SLP Time Calculation (min): 22 min  Past Medical History:  Past Medical History  Diagnosis Date  . Hypertension   . Parkinson's disease   . Hyperlipidemia   . Dizziness   . Orthostatic hypotension   . Anemia   . Tremor   . Breast cancer     LEFT BREAST  . Kidney failure     stage 3  . Hypothyroidism   . Prediabetes    Past Surgical History:  Past Surgical History  Procedure Laterality Date  . Cardiovascular stress test  06/2007    NORMAL  . Breast lumpectomy    . Cholecystectomy    . Tonsillectomy    . Nasal sinus surgery      submucous resection late 1950s   HPI:  39 F with Parkinson's Disease, CKD, HTN, pre-DM who presented  to  [facility] on 10/16 with likely TIA difficulty in getting words out and  right arm felt funny, lasting about one hour and resolved.  Subsequently developed severe hypoxic respiratory failure almost  certainly 2/2 aspiration during MRI. Intubated from 10/16 to  10/17. MRI showed no acute finding. Pt reproted in chart to have  a history of dysphagia and drinks nectar thick liquids at home  but no notes from SLP are available.      Assessment / Plan / Recommendation Clinical Impression  Dysphagia Diagnosis: Mild pharyngeal phase dysphagia Clinical impression: Pt demonstrates a mild pharyngeal dysphagia  with normal age related swallow changes. Mild penetration of thin  liquids is consistent and likely due to incomplete laryngeal  closure during the swallow related to reduced base of tongue  retraction and epiglottic deflection. SLP provided max verbal  cues for chin tuck which is effective in preventing aspiration,  but requires full supervision and maximal cueing. Penetrate is  also cleared with a throat clear and second swallow. Mild residue  is retained in the valleculae with solids and liquids, also  cleared with a second swallow. Independent consumption of nectar  thick liquids is also effective in preventing aspiration.  Aspiration risk may be higher at times given report of  fluctuating respiratory status from daughter. Given stable  respiratory function during exam, favor consumption of thin  liquids into long term. However, family may choose more  conservative diet to avoid related risks. Will discuss with pt's  daughter.    Treatment Recommendation  Therapy as outlined in treatment plan below    Diet Recommendation Regular;Thin liquid   Liquid Administration via: Straw Medication Administration: Whole meds with liquid Supervision: Full supervision/cueing for compensatory strategies Compensations: Slow rate;Small sips/bites;Clear throat after each  swallow Postural Changes and/or Swallow Maneuvers: Seated upright  90  degrees    Other  Recommendations Oral Care Recommendations: Oral care BID   Follow Up Recommendations  Inpatient Rehab    Frequency and Duration min 2x/week  2 weeks  Pertinent Vitals/Pain none    SLP Swallow Goals     General Date of Onset: 12/21/13 HPI: 62 F with Parkinson's Disease, CKD, HTN, pre-DM who  presented to [facility] on 10/16 with likely TIA difficulty in getting  words out and right arm felt funny, lasting about one hour and  resolved. Subsequently developed severe hypoxic respiratory  failure almost certainly 2/2 aspiration during MRI. Intubated  from 10/16 to 10/17. MRI showed no acute finding. Pt reproted in  chart to have a history of dysphagia and drinks nectar thick  liquids at home but no notes from SLP are available.  Type of Study: Modified Barium Swallowing Study Reason for Referral: Objectively evaluate swallowing function Previous Swallow Assessment: MBS at [facility] in  September - regular textures, nectar thick liquids with a chin  tuck and a straw Diet Prior to this Study: Dysphagia 3 (soft);Nectar-thick liquids Temperature Spikes Noted: No Respiratory Status: Room air History of Recent Intubation: Yes Length of Intubations (days): 2 days Date extubated: 12/22/13 Behavior/Cognition: Alert;Cooperative;Requires cueing Oral Cavity - Dentition: Adequate natural dentition Oral Motor / Sensory Function: Within functional limits Self-Feeding Abilities: Able to feed self Patient Positioning: Upright in chair Baseline Vocal Quality: Clear Volitional Cough: Strong Volitional Swallow: Able to elicit Anatomy: Within functional limits    Reason for Referral Objectively evaluate swallowing function   Oral Phase Oral Preparation/Oral Phase Oral Phase: WFL   Pharyngeal Phase Pharyngeal Phase Pharyngeal Phase: Impaired Pharyngeal - Nectar Pharyngeal - Nectar Straw: Pharyngeal residue -  valleculae;Pharyngeal residue - pyriform sinuses Pharyngeal - Thin Pharyngeal - Thin Straw: Delayed swallow  initiation;Premature  spillage to valleculae;Premature spillage to pyriform  sinuses;Penetration/Aspiration after swallow;Trace  aspiration;Compensatory strategies attempted (Comment) (chin  tuck) Penetration/Aspiration details (thin straw): Material enters  airway, CONTACTS cords and not ejected out Pharyngeal - Solids Pharyngeal - Puree: Pharyngeal residue - valleculae Pharyngeal - Regular: Pharyngeal residue - valleculae  Cervical Esophageal Phase    GO              Eden Emms 12/27/2013, 11:33 AM    US Abdomen Limited Ruq  12/24/2013   CLINICAL DATA:  80 year old female with history of high blood pressure, breast cancer, pre diabetes, post cholecystectomy now with elevated transaminases. Initial encounter.  EXAM: US ABDOMEN LIMITED - RIGHT UPPER QUADRANT  COMPARISON:  None.  FINDINGS: Gallbladder:  Surgically removed.  Common bile duct:  Diameter: 4.6 mm.  Liver:  Heterogeneous echotexture which may represent fatty infiltration without focal mass or intrahepatic biliary duct dilation.  IMPRESSION: Post cholecystectomy.  No evidence of common bile duct or intrahepatic biliary duct dilation.  Heterogeneous echotexture liver without focal mass. This may represent result of fatty infiltration   Electronically Signed   By: Chauncey Cruel M.D.   On: 12/24/2013 15:55    ASSESSMENT/PLAN:  TIA - continue Plavix and ASA Hypertension - continue Metoprolol and Spironolactone Hyperlipidemia - continue Crestor Parkinson's Disease - continue Rasagline and Ritigotine Hypothyroidism - continue Synthroid Sjogren's syndrome - stable   CPT CODE: 93810    MEDINA-VARGAS,Esteban Kobashigawa, NP [place]

## 2013-12-30 NOTE — Discharge Summary (Signed)
I saw Victoria Lindsey on day of discharge and assisted in the discharge planning.

## 2014-01-01 ENCOUNTER — Non-Acute Institutional Stay (SKILLED_NURSING_FACILITY): Payer: Medicare Other | Admitting: Internal Medicine

## 2014-01-01 DIAGNOSIS — R7401 Elevation of levels of liver transaminase levels: Secondary | ICD-10-CM

## 2014-01-01 DIAGNOSIS — R740 Nonspecific elevation of levels of transaminase and lactic acid dehydrogenase [LDH]: Secondary | ICD-10-CM

## 2014-01-01 DIAGNOSIS — G20 Parkinson's disease: Secondary | ICD-10-CM

## 2014-01-01 DIAGNOSIS — I10 Essential (primary) hypertension: Secondary | ICD-10-CM

## 2014-01-01 DIAGNOSIS — E039 Hypothyroidism, unspecified: Secondary | ICD-10-CM

## 2014-01-03 NOTE — Progress Notes (Signed)
HISTORY & PHYSICAL  DATE: 01/01/2014   FACILITY: [facility]  LEVEL OF CARE: SNF (31)  ALLERGIES:  Allergies  Allergen Reactions  . Aciphex [Rabeprazole Sodium]   . Amantadines   . Avapro [Irbesartan]   . Carbidopa-Levodopa   . Cardura [Doxazosin Mesylate]   . Ciprofloxacin   . Clonidine Derivatives   . Cozaar   . Diltiazem   . Famotidine   . Hctz [Hydrochlorothiazide]   . Indapamide   . Lansoprazole   . Lisinopril     Mouth problems  . Loratadine   . Maxidex [Dexamethasone]   . Mirapex [Pramipexole Dihydrochloride]   . Norvasc [Amlodipine Besylate]   . Pantoprazole Sodium   . Penicillins   . Prilosec [Omeprazole]   . Procardia [Nifedipine]   . Proton Pump Inhibitors   . Sulfa Drugs Cross Reactors   . Trimethoprim   . Zyrtec [Cetirizine Hcl]     CHIEF COMPLAINT:  Manage HTN, hypothyroidism & PD  HISTORY OF PRESENT ILLNESS: 80 y/o Caucasian female was hospitalized secondary to acute respiratory failure & TIA.  After hospitalization pt is admitted to this facility for short term rehabilitation.  HTN: Pt 's HTN remains stable.  Denies CP, sob, DOE, pedal edema, headaches, dizziness or visual disturbances.  No complications from the medications currently being used.  Last BP :150/65.  HYPOTHYROIDISM: The hypothyroidism remains stable. No complications noted from the medications presently being used.  The patient denies fatigue or constipation.  Last TSH 7.  PARKINSON'S DISEASE: pt's Parkinson's disease is stable.  Denies progression of sx recently.  Pt is tolerating Parkinson's disease medications without any complications.  PAST MEDICAL HISTORY :  Past Medical History  Diagnosis Date  . Hypertension   . Parkinson's disease   . Hyperlipidemia   . Dizziness   . Orthostatic hypotension   . Anemia   . Tremor   . Breast cancer     LEFT BREAST  . Kidney failure     stage 3  . Hypothyroidism   . Prediabetes     PAST SURGICAL  HISTORY: Past Surgical History  Procedure Laterality Date  . Cardiovascular stress test  06/2007    NORMAL  . Breast lumpectomy    . Cholecystectomy    . Tonsillectomy    . Nasal sinus surgery      submucous resection late 1950s    SOCIAL HISTORY:  reports that she quit smoking about 49 years ago. Her smoking use included Cigarettes. She has a 6 pack-year smoking history. She does not have any smokeless tobacco history on file. She reports that she does not drink alcohol or use illicit drugs.  FAMILY HISTORY:  Family History  Problem Relation Age of Onset  . Heart disease Mother   . Heart failure Father   . Parkinsonism Father     CURRENT MEDICATIONS: Reviewed per MAR/see medication list  REVIEW OF SYSTEMS:  See HPI otherwise 14 point ROS is negative.  PHYSICAL EXAMINATION  VS:  See VS section  GENERAL: no acute distress, normal body habitus EYES: conjunctivae normal, sclerae normal, normal eye lids MOUTH/THROAT: lips without lesions,no lesions in the mouth,tongue is without lesions,uvula elevates in midline NECK: supple, trachea midline, no neck masses, no thyroid tenderness, no thyromegaly LYMPHATICS: no LAN in the neck, no supraclavicular LAN RESPIRATORY: breathing is even & unlabored, BS CTAB CARDIAC: RRR, no murmur,no extra heart sounds, +1 BLE edema GI:  ABDOMEN: abdomen soft, normal BS, no  masses, no tenderness  LIVER/SPLEEN: no hepatomegaly, no splenomegaly MUSCULOSKELETAL: HEAD: normal to inspection  EXTREMITIES: LEFT UPPER EXTREMITY: full range of motion, normal strength & tone RIGHT UPPER EXTREMITY:  full range of motion, normal strength & tone LEFT LOWER EXTREMITY: moderate range of motion, decraesed strength & tone, weaker than RLE RIGHT LOWER EXTREMITY: moderate range of motion, decreased strength & tone PSYCHIATRIC: the patient is alert & oriented to person, affect & behavior appropriate  LABS/RADIOLOGY:  Labs reviewed: Basic Metabolic  Panel:  Recent Labs  12/24/13 0309 12/25/13 0844 12/26/13 0555  NA 136* 139 139  K 4.0 4.0 4.4  CL 99 101 103  CO2 26 26 26   GLUCOSE 113* 134* 97  BUN 31* 31* 32*  CREATININE 0.68 0.63 0.68  CALCIUM 9.1 9.2 8.8   Liver Function Tests:  Recent Labs  12/21/13 2300 12/22/13 0239 12/25/13 0844  AST 56* 58* 67*  ALT 55* 57* 51*  ALKPHOS 135* 136* 128*  BILITOT 0.3 0.4 0.5  PROT 6.7 6.9 6.8  ALBUMIN 2.9* 3.0* 3.2*   CBC:  Recent Labs  10/18/13 1436 12/21/13 1514  12/22/13 0239 12/24/13 0309 12/26/13 0555  WBC 5.6 4.8  < > 7.9 5.0 5.4  NEUTROABS 3.0 2.6  --   --   --   --   HGB 11.2* 11.1*  < > 10.9* 10.6* 10.1*  HCT 33.3* 33.9*  < > 33.7* 32.4* 31.3*  MCV 97.5 95.5  < > 97.7 95.6 98.1  PLT 142.0* 85*  < > 115* 107* 126*  < > = values in this interval not displayed.  Lipid Panel:  LDL 31, TC 99  Recent Labs  12/22/13 0239  HDL 41   Cardiac Enzymes:  Recent Labs  12/26/13 2239 12/27/13 0321 12/27/13 1210  TROPONINI <0.30 <0.30 <0.30   CBG:  Recent Labs  12/23/13 0812 12/23/13 1203 12/23/13 1638  GLUCAP 80 94 211*   Hepatitis A, B & C Abs/serologies negative HIV Ab NR  Transthoracic Echocardiography  Patient:    Tashyra, Adduci MR #:       [id number] Study Date: 10/25/2013 Gender:     F Age:        25 Height:     154.9 cm Weight:     54.4 kg BSA:        1.54 m^2 Pt. Status: Room:   ATTENDING    Jolyn Nap, M.D.  [facility]  SONOGRAPHER  Cindy Hazy, RDCS  SONOGRAPHER  Wyatt Mage, RDCS  PERFORMING   [facility], Outpatient  cc:  ------------------------------------------------------------------- LV EF: 75% -   80%  ------------------------------------------------------------------- Indications:      Shortness of breath 786.05.  ------------------------------------------------------------------- History:   PMH:   Dyspnea.  Transient ischemic attack.  Risk factors:   Hyperglycemia. Sjogren&'s syndrome. Parkinson&'s disease. Hypothyroidism. Anemia. Former tobacco use. Hypertension. Dyslipidemia.  ------------------------------------------------------------------- Study Conclusions  - Left ventricle: The cavity size was normal. Wall thickness was   increased in a pattern of mild LVH. Systolic function was   hyperdynamic. The estimated ejection fraction was in the range of   75% to 80%. Wall motion was normal; there were no regional wall   motion abnormalities. There was an increased relative   contribution of atrial contraction to ventricular filling.   Doppler parameters are consistent with abnormal left ventricular   relaxation (grade 1 diastolic dysfunction). - Aortic valve: There was mild regurgitation. - Left atrium:  The atrium was mildly dilated.  ------------------------------------------------------------------- Labs, prior tests, procedures, and surgery: Transthoracic echocardiography (06/05/2012).     EF was 65%.  Transthoracic echocardiography.  M-mode, complete 2D, spectral Doppler, and color Doppler.  Birthdate:  Patient birthdate: 07/03/1925.  Age:  Patient is 80 yr old.  Sex:  Gender: female. BMI: 22.7 kg/m^2.  Blood pressure:     175/89  Patient status: Outpatient.  Study date:  Study date: 10/25/2013. Study time: 10:51 AM.  Location:  [facility]  -------------------------------------------------------------------  ------------------------------------------------------------------- Left ventricle:  The cavity size was normal. Wall thickness was increased in a pattern of mild LVH. Systolic function was hyperdynamic. The estimated ejection fraction was in the range of 75% to 80%. Wall motion was normal; there were no regional wall motion abnormalities. There was an increased relative contribution of atrial contraction to ventricular filling. Doppler parameters are consistent with abnormal left ventricular relaxation (grade  1 diastolic dysfunction).  ------------------------------------------------------------------- Aortic valve:   Structurally normal valve.   Cusp separation was normal.  Doppler:  Transvalvular velocity was within the normal range. There was no stenosis. There was mild regurgitation.  ------------------------------------------------------------------- Aorta:  Aortic root: The aortic root was normal in size. Ascending aorta: The ascending aorta was normal in size.  ------------------------------------------------------------------- Mitral valve:   Mildly calcified leaflets .  Doppler:  There was no significant regurgitation.  ------------------------------------------------------------------- Left atrium:  The atrium was mildly dilated.  ------------------------------------------------------------------- Right ventricle:  The cavity size was normal. Systolic function was normal.  ------------------------------------------------------------------- Pulmonic valve:    The valve appears to be grossly normal. Doppler:  There was no significant regurgitation.  ------------------------------------------------------------------- Tricuspid valve:   Structurally normal valve.   Leaflet separation was normal.  Doppler:  Transvalvular velocity was within the normal range. There was no significant regurgitation.  ------------------------------------------------------------------- Right atrium:  The atrium was normal in size.  ------------------------------------------------------------------- Pericardium:  There was no pericardial effusion.  ------------------------------------------------------------------- Measurements   Left ventricle                   Value        06/05/2012 Reference  LV ID, ED, PLAX chordal  (L)     41    mm     36.2       43 - 52  LV ID, ES, PLAX chordal          23    mm     21.2       23 - 38  LV fx shortening, PLAX           44    %      41         >=29   chordal  LV PW thickness, ED              12    mm     16.5       ---------  IVS/LV PW ratio, ED              1            0.74       <=1.3  Stroke volume, 2D                77    ml     ---------- ---------  Stroke volume/bsa, 2D            50    ml/m^2 ---------- ---------  LV e&', lateral  3.92  cm/s   6.47       ---------  LV E/e&', lateral                 14.85        10.09      ---------  LV e&', medial                    4.13  cm/s   6.47       ---------  LV E/e&', medial                  14.09        10.09      ---------  LV e&', average                   4.03  cm/s   ---------- ---------  LV E/e&', average                 14.46        ---------- ---------    Ventricular septum               Value        06/05/2012 Reference  IVS thickness, ED                12    mm     12.2       ---------    LVOT                             Value        06/05/2012 Reference  LVOT ID, S                       19    mm     17         ---------  LVOT area                        2.84  cm^2   2.27       ---------  LVOT peak velocity, S            123   cm/s   114        ---------  LVOT mean velocity, S            80.2  cm/s   ---------- ---------  LVOT VTI, S                      27.1  cm     24.7       ---------  LVOT peak gradient, S            6     mm Hg  5          ---------    Aorta                            Value        06/05/2012 Reference  Aortic root ID, ED               31    mm     30         ---------    Left atrium  Value        06/05/2012 Reference  LA ID, A-P, ES                   24    mm     30         ---------  LA ID/bsa, A-P                   1.56  cm/m^2 1.93       <=2.2  LA volume, S                     58.5  ml     ---------- ---------  LA volume/bsa, S                 38.1  ml/m^2 ---------- ---------    Mitral valve                     Value        06/05/2012 Reference  Mitral E-wave peak               58.2  cm/s   65.3        ---------  velocity  Mitral A-wave peak               89.7  cm/s   93.8       ---------  velocity  Mitral deceleration time (H)     345   ms     236        150 - 230  Mitral E/A ratio, peak           0.6          0.7        ---------    Systemic veins                   Value        06/05/2012 Reference  Estimated CVP                    3     mm Hg  ---------- ---------    Right ventricle                  Value        06/05/2012 Reference  RV s&', lateral, S                11.6  cm/s   13.5       ---------  Carotid Duplex Study  Patient:    Shacarra, Choe MR #:       [id number] Study Date: 12/22/2013 Gender:     F Age:        69 Height: Weight: BSA: Pt. Status: Room:       2S07C   ATTENDING    Guerry Minors  REFERRING    Oval Linsey D  SONOGRAPHER  Landry Mellow, RVT, RDMS  ADMITTING    Terressa Koyanagi D  Reports also to:  ------------------------------------------------------------------- History and indications:  Indications  435.9 Unspecified transient cerebral ischemia.  History  Diagnostic evaluation. No prior study is available for comparison.   ------------------------------------------------------------------- Study information:  Study status:  Routine.  Procedure:  The right CCA, right ECA, right ICA, right vertebral, left CCA, left ECA, left ICA, and left vertebral arteries were examined. A vascular evaluation was performed with the  patient in the supine position. Image quality was adequate.    Carotid duplex study.     Carotid Duplex exam including 2D imaging, color and spectral Doppler were performed on the extracranial carotid and vertebral arteries using standard established protocols.  Birthdate:  Patient birthdate: January 23, 1926. Age:  Patient is 80 yr old.  Sex:  Gender: female.  Study date: Study date: 12/22/2013. Study time: 10:48 AM.  Location:  Bedside. Patient status:  Inpatient.  Arterial  flow:  +--------------------+---------+-------+--------------------------+ Location            V sys    V ed   Comment                    +--------------------+---------+-------+--------------------------+ Right CCA - proximal-87 cm/s -13    --------------------------                              cm/s                              +--------------------+---------+-------+--------------------------+ Right CCA - distal  -80 cm/s -10    --------------------------                              cm/s                              +--------------------+---------+-------+--------------------------+ Right ECA           119 cm/s 16 cm/smild homogenous plaque.    +--------------------+---------+-------+--------------------------+ Right ICA - proximal81 cm/s  15 cm/smild heterogenous plaque.  +--------------------+---------+-------+--------------------------+ Right ICA - distal  -83 cm/s -24    --------------------------                              cm/s                              +--------------------+---------+-------+--------------------------+ Right vertebral     39 cm/s  12 cm/s-------------------------- +--------------------+---------+-------+--------------------------+ Left CCA - proximal 88 cm/s  12 cm/smild heterogenous plaque.  +--------------------+---------+-------+--------------------------+ Left CCA - distal   -95 cm/s -11    trace heterogenous plaque.                              cm/s                              +--------------------+---------+-------+--------------------------+ Left ECA            -189 cm/s-16    mild homogenous plaque.                                 cm/s                              +--------------------+---------+-------+--------------------------+ Left ICA - proximal -100 cm/s-15    trace homogenous plaque.  cm/s                               +--------------------+---------+-------+--------------------------+ Left ICA - distal   -91 cm/s -21    --------------------------                              cm/s                              +--------------------+---------+-------+--------------------------+ Left vertebral      36 cm/s  9 cm/s -------------------------- +--------------------+---------+-------+--------------------------+  ------------------------------------------------------------------- Summary:  - The vertebral arteries appear patent with antegrade flow. - Findings consistent with 1- 39 percent stenosis involving the   right internal carotid artery and the left internal carotid   artery. - ICA/CCA ratio. right = 1.04. left = 1.05.  CT ANGIOGRAPHY CHEST WITH CONTRAST   TECHNIQUE: Multidetector CT imaging of the chest was performed using the standard protocol during bolus administration of intravenous contrast. Multiplanar CT image reconstructions and MIPs were obtained to evaluate the vascular anatomy.   CONTRAST:  45mL OMNIPAQUE IOHEXOL 350 MG/ML SOLN   COMPARISON:  11/21/2013 chest radiograph   FINDINGS: Breathing motion artifact obscures the lung bases.   Subject to this mild limitation, no filling defect is identified in the pulmonary arterial tree to suggest pulmonary embolus.   Thoracic aortic atherosclerotic calcification noted with some mural thrombus along the descending thoracic aorta. There is likely some degree of coronary atherosclerosis. Mildly enlarged cardiopericardial silhouette. Interventricular septal thickness 1.5 cm, borderline for left ventricular hypertrophy.   Cystic lesion in the left breast 2.8 by 3.5 cm, image 46 series 4. Adjacent pleural thickening and subpleural interstitial accentuation suggesting radiation therapy in the region.   Subacute left anterior third and fourth rib fractures could be posttraumatic or due to the localized radiation therapy  causing weakening in the bone. Mild adjacent pleural thickening noted.   Thoracic spondylosis. Slight superior endplate concavities at T2 and T3 are age indeterminate.   Mild mosaic attenuation in the lungs with equal visibility of vessels in the darker regions. Bilateral airway thickening in the lower lobes.   Review of the MIP images confirms the above findings.   IMPRESSION: 1. No pulmonary embolus is identified. 2. Subacute left anterior third and fourth rib fractures. This is in the vicinity of localized radiation therapy, which may have predispose to fracture. Mild adjacent pleural thickening. 3. Cystic lesion in the left breast 2.8 by 3.5 cm, probably post procedural. 4. Mild superior endplate concavities at T2 and T3, age indeterminate, likely subtle superior endplate compression fractures. 5. Mosaic attenuation in the lungs may be from low-grade alveolar infiltration from edema, or air trapping. There is notable airway thickening in both lower lobes. 6. Atherosclerosis.   CT HEAD WITHOUT CONTRAST   TECHNIQUE: Contiguous axial images were obtained from the base of the skull through the vertex without intravenous contrast.   COMPARISON:  MRI 04/12/2012   FINDINGS: Generalized atrophy. Mild chronic microvascular ischemic change in the white matter.   Negative for acute infarct.  Negative for hemorrhage or mass.   Negative for skull fracture. Atherosclerotic disease in the cavernous carotid bilaterally. Mucosal thickening left sphenoid sinus.   IMPRESSION: Atrophy and chronic microvascular ischemia.  No acute abnormality     MRI HEAD WITHOUT CONTRAST   TECHNIQUE: Multiplanar, multiecho pulse sequences  of the brain and surrounding structures were obtained without intravenous contrast.   COMPARISON:  Head CT same day.  MRI 04/12/2012.   FINDINGS: Diffusion imaging does not show any acute or subacute infarction. The brainstem is normal. There are a few old  small vessel cerebellar infarctions. There are a few old small vessel infarctions affecting the thalami basal ganglia. There are moderate chronic small-vessel ischemic changes throughout the deep and subcortical white matter. No cortical or large vessel territory infarction. No mass lesion, hemorrhage, hydrocephalus or extra-axial collection. There is arachnoid herniation into the sella but no pituitary mass. No inflammatory sinus disease. No skull or skullbase lesion.   IMPRESSION: No acute or subacute infarction. Moderate chronic small-vessel ischemic changes throughout the brain as outlined above.   PORTABLE CHEST - 1 VIEW SAME DAY   COMPARISON:  Single view of the chest 12/21/2013 at 2133 hr   FINDINGS: ET tube is in place with the tip in good position at the inferior aspect of the clavicular heads. NG tube tip is in the stomach. Patchy bilateral airspace disease and cardiomegaly appear unchanged.   IMPRESSION: ET tube projects in good position.   No change in pulmonary edema. US ABDOMEN LIMITED - RIGHT UPPER QUADRANT   COMPARISON:  None.   FINDINGS: Gallbladder:   Surgically removed.   Common bile duct:   Diameter: 4.6 mm.   Liver:   Heterogeneous echotexture which may represent fatty infiltration without focal mass or intrahepatic biliary duct dilation.   IMPRESSION: Post cholecystectomy.   No evidence of common bile duct or intrahepatic biliary duct dilation.   Heterogeneous echotexture liver without focal mass. This may represent result of fatty infiltration     ASSESSMENT/PLAN:  HTN-uncontrolled.  Spironolactone was increased.  Monitor. Hypothyroidism-cont. Levothyroxine.  Recheck TSH in 6 wks. PD-cont. Current meds. Transaminitis-recheck Hyperlipidemia-well controlled on crestor Sjogren's syndrome-stable Anemia of chronic dz-recheck Hb Thrombocytopenia-recheck Check cbc & cmp  I have reviewed patient's medical records received at  admission/from hospitalization.  CPT CODE: 66063  Gayani Y Dasanayaka, [facility] [phone]

## 2014-01-07 ENCOUNTER — Telehealth: Payer: Self-pay | Admitting: Cardiology

## 2014-01-07 ENCOUNTER — Non-Acute Institutional Stay (SKILLED_NURSING_FACILITY): Payer: Medicare Other | Admitting: Adult Health

## 2014-01-07 ENCOUNTER — Encounter: Payer: Self-pay | Admitting: Adult Health

## 2014-01-07 DIAGNOSIS — I10 Essential (primary) hypertension: Secondary | ICD-10-CM

## 2014-01-07 DIAGNOSIS — M3500 Sicca syndrome, unspecified: Secondary | ICD-10-CM

## 2014-01-07 NOTE — Telephone Encounter (Signed)
We will increase her metoprolol to 100 mg twice a day

## 2014-01-07 NOTE — Progress Notes (Signed)
Patient ID: Victoria Lindsey, female   DOB: [birth year], 80 y.o.   MRN: [id number]   01/07/2014  Facility:  Nursing Home Location:  [place] Room Number: 1206-P LEVEL OF CARE:  SNF (31)   Chief Complaint  Patient presents with  . Acute Visit    Sjorgen's Syndrome, Hypertension    HISTORY OF PRESENT ILLNESS:  This is an 80 year old female who was noted to have elevated BPS - 181/69, 182/66, 177/90. No complaints of headache nor chest pains. Patient has poor appetite. Daughter, who is a Software engineer, says that she used to take Salagen to increase salivation but patient has stopped using it because she wakes up drooling in the morning. Patient complained that she has dry mouth and causing her to have poor appetite.  PAST MEDICAL HISTORY:  Past Medical History  Diagnosis Date  . Hypertension   . Parkinson's disease   . Hyperlipidemia   . Dizziness   . Orthostatic hypotension   . Anemia   . Tremor   . Breast cancer     LEFT BREAST  . Kidney failure     stage 3  . Hypothyroidism   . Prediabetes     CURRENT MEDICATIONS: Reviewed per MAR/see medication list  Allergies  Allergen Reactions  . Aciphex [Rabeprazole Sodium]   . Amantadines   . Avapro [Irbesartan]   . Carbidopa-Levodopa   . Cardura [Doxazosin Mesylate]   . Ciprofloxacin   . Clonidine Derivatives   . Cozaar   . Diltiazem   . Famotidine   . Hctz [Hydrochlorothiazide]   . Indapamide   . Lansoprazole   . Lisinopril     Mouth problems  . Loratadine   . Maxidex [Dexamethasone]   . Mirapex [Pramipexole Dihydrochloride]   . Norvasc [Amlodipine Besylate]   . Pantoprazole Sodium   . Penicillins   . Prilosec [Omeprazole]   . Procardia [Nifedipine]   . Proton Pump Inhibitors   . Sulfa Drugs Cross Reactors   . Trimethoprim   . Zyrtec [Cetirizine Hcl]      REVIEW OF SYSTEMS:  GENERAL: poor appetite, no fatigue, no fever, chills or weakness RESPIRATORY: no cough, SOB, DOE,  wheezing, hemoptysis CARDIAC: no chest pain, palpitations, + edema GI: no abdominal pain, diarrhea, heart burn, nausea or vomiting, +constipation  PHYSICAL EXAMINATION  GENERAL: no acute distress, normal body habitus NECK: supple, trachea midline, no neck masses, no thyroid tenderness, no thyromegaly LYMPHATICS: no LAN in the neck, no supraclavicular LAN RESPIRATORY: breathing is even & unlabored, BS CTAB CARDIAC: RRR, no murmur,no extra heart sounds, BLE edema 2+ GI: abdomen soft, normal BS, no masses, no tenderness, no hepatomegaly, no splenomegaly EXTREMITIES: able to move all 4 extremities PSYCHIATRIC: the patient is alert & oriented to person, affect & behavior appropriate  LABS/RADIOLOGY: 01/02/14  Wbc 6.2  hgb 9.1  hct 28.8 MCV 98.3 NA 133 K 4.7 glucose 99  BUN 19  Creatinine 0.6  CA 8.9  Alb 2.8   Labs reviewed: Basic Metabolic Panel:  Recent Labs  12/24/13 0309 12/25/13 0844 12/26/13 0555  NA 136* 139 139  K 4.0 4.0 4.4  CL 99 101 103  CO2 26 26 26   GLUCOSE 113* 134* 97  BUN 31* 31* 32*  CREATININE 0.68 0.63 0.68  CALCIUM 9.1 9.2 8.8   Liver Function Tests:  Recent Labs  12/21/13 2300 12/22/13 0239 12/25/13 0844  AST 56* 58* 67*  ALT 55* 57* 51*  ALKPHOS 135* 136* 128*  BILITOT 0.3 0.4 0.5  PROT 6.7 6.9 6.8  ALBUMIN 2.9* 3.0* 3.2*   CBC:  Recent Labs  10/18/13 1436 12/21/13 1514  12/22/13 0239 12/24/13 0309 12/26/13 0555  WBC 5.6 4.8  < > 7.9 5.0 5.4  NEUTROABS 3.0 2.6  --   --   --   --   HGB 11.2* 11.1*  < > 10.9* 10.6* 10.1*  HCT 33.3* 33.9*  < > 33.7* 32.4* 31.3*  MCV 97.5 95.5  < > 97.7 95.6 98.1  PLT 142.0* 85*  < > 115* 107* 126*  < > = values in this interval not displayed.  Lipid Panel:  Recent Labs  12/22/13 0239  HDL 41   Cardiac Enzymes:  Recent Labs  12/26/13 2239 12/27/13 0321 12/27/13 1210  TROPONINI <0.30 <0.30 <0.30   CBG:  Recent Labs  12/23/13 0812 12/23/13 1203 12/23/13 1638  GLUCAP 80 94 211*     Dg Chest 2 View  12/21/2013   CLINICAL DATA:  Hypertension.  Headache and dizziness  EXAM: CHEST  2 VIEW  COMPARISON:  11/21/2013  FINDINGS: Cardiac enlargement without heart failure. Elevated left hemidiaphragm and left lower lobe atelectasis/scarring unchanged. Negative for pneumonia. Negative for pleural effusion  Moderate to severe chronic compression fracture approximately L1 unchanged from the prior study.  IMPRESSION: Chronic lung disease is stable.  No superimposed acute abnormality.   Electronically Signed   By: Franchot Gallo M.D.   On: 12/21/2013 14:26   Ct Head Wo Contrast  12/21/2013   CLINICAL DATA:  Parkinson's.  Recent fall.  Fell and hit head today  EXAM: CT HEAD WITHOUT CONTRAST  TECHNIQUE: Contiguous axial images were obtained from the base of the skull through the vertex without intravenous contrast.  COMPARISON:  MRI 04/12/2012  FINDINGS: Generalized atrophy. Mild chronic microvascular ischemic change in the white matter.  Negative for acute infarct.  Negative for hemorrhage or mass.  Negative for skull fracture. Atherosclerotic disease in the cavernous carotid bilaterally. Mucosal thickening left sphenoid sinus.  IMPRESSION: Atrophy and chronic microvascular ischemia.  No acute abnormality   Electronically Signed   By: Franchot Gallo M.D.   On: 12/21/2013 15:48   Ct Angio Chest W/cm &/or Wo Cm  12/04/2013   CLINICAL DATA:  Increased shortness of breath. Fall with back fractures in July. History of breast cancer 5 years ago.  EXAM: CT ANGIOGRAPHY CHEST WITH CONTRAST  TECHNIQUE: Multidetector CT imaging of the chest was performed using the standard protocol during bolus administration of intravenous contrast. Multiplanar CT image reconstructions and MIPs were obtained to evaluate the vascular anatomy.  CONTRAST:  70mL OMNIPAQUE IOHEXOL 350 MG/ML SOLN  COMPARISON:  11/21/2013 chest radiograph  FINDINGS: Breathing motion artifact obscures the lung bases.  Subject to this mild  limitation, no filling defect is identified in the pulmonary arterial tree to suggest pulmonary embolus.  Thoracic aortic atherosclerotic calcification noted with some mural thrombus along the descending thoracic aorta. There is likely some degree of coronary atherosclerosis. Mildly enlarged cardiopericardial silhouette. Interventricular septal thickness 1.5 cm, borderline for left ventricular hypertrophy.  Cystic lesion in the left breast 2.8 by 3.5 cm, image 46 series 4. Adjacent pleural thickening and subpleural interstitial accentuation suggesting radiation therapy in the region.  Subacute left anterior third and fourth rib fractures could be posttraumatic or due to the localized radiation therapy causing weakening in the bone. Mild adjacent pleural thickening noted.  Thoracic spondylosis. Slight superior endplate concavities at T2 and T3 are age indeterminate.  Mild mosaic attenuation in the lungs with equal visibility of vessels in the darker regions. Bilateral airway thickening in the lower lobes.  Review of the MIP images confirms the above findings.  IMPRESSION: 1. No pulmonary embolus is identified. 2. Subacute left anterior third and fourth rib fractures. This is in the vicinity of localized radiation therapy, which may have predispose to fracture. Mild adjacent pleural thickening. 3. Cystic lesion in the left breast 2.8 by 3.5 cm, probably post procedural. 4. Mild superior endplate concavities at T2 and T3, age indeterminate, likely subtle superior endplate compression fractures. 5. Mosaic attenuation in the lungs may be from low-grade alveolar infiltration from edema, or air trapping. There is notable airway thickening in both lower lobes. 6. Atherosclerosis.   Electronically Signed   By: Sherryl Barters M.D.   On: 12/04/2013 15:56   Mr Brain Wo Contrast  12/21/2013   CLINICAL DATA:  Episode of speech disturbance and right-sided weakness.  EXAM: MRI HEAD WITHOUT CONTRAST  TECHNIQUE: Multiplanar,  multiecho pulse sequences of the brain and surrounding structures were obtained without intravenous contrast.  COMPARISON:  Head CT same day.  MRI 04/12/2012.  FINDINGS: Diffusion imaging does not show any acute or subacute infarction. The brainstem is normal. There are a few old small vessel cerebellar infarctions. There are a few old small vessel infarctions affecting the thalami basal ganglia. There are moderate chronic small-vessel ischemic changes throughout the deep and subcortical white matter. No cortical or large vessel territory infarction. No mass lesion, hemorrhage, hydrocephalus or extra-axial collection. There is arachnoid herniation into the sella but no pituitary mass. No inflammatory sinus disease. No skull or skullbase lesion.  IMPRESSION: No acute or subacute infarction. Moderate chronic small-vessel ischemic changes throughout the brain as outlined above.   Electronically Signed   By: Nelson Chimes M.D.   On: 12/21/2013 18:44   Dg Chest Port 1 View  12/21/2013   CLINICAL DATA:  Status post intubation.  EXAM: PORTABLE CHEST - 1 VIEW  COMPARISON:  Single view of the chest 12/21/2013 at 1922 hr.  FINDINGS: The patient has a new endotracheal tube in place with the tip in good position at the level of the clavicular heads. NG tube is also in good position with the tip in the stomach. The chest is better expanded with decreased atelectasis. Patchy bilateral airspace disease likely due to edema again seen. Cardiomegaly.  IMPRESSION: ET tube and NG tube project in good position.  Decreased atelectasis with pulmonary edema again seen.   Electronically Signed   By: Inge Rise M.D.   On: 12/21/2013 21:49   Dg Chest Port 1 View  12/21/2013   CLINICAL DATA:  Acute hypoxia.  EXAM: PORTABLE CHEST - 1 VIEW  COMPARISON:  PA and lateral chest earlier this same day.  FINDINGS: Since the earlier study, extensive bilateral airspace disease has developed. Heart size is enlarged. There is small left  pleural effusion. No pneumothorax.  IMPRESSION: New, extensive bilateral airspace disease is likely due to pulmonary edema.   Electronically Signed   By: Inge Rise M.D.   On: 12/21/2013 19:59   Dg Chest Port 1v Same Day  12/21/2013   CLINICAL DATA:  Status post repositioning of endotracheal tube.  EXAM: PORTABLE CHEST - 1 VIEW SAME DAY  COMPARISON:  Single view of the chest 12/21/2013 at 2133 hr  FINDINGS: ET tube is in place with the tip in good position at the inferior aspect of the clavicular heads. NG tube tip is  in the stomach. Patchy bilateral airspace disease and cardiomegaly appear unchanged.  IMPRESSION: ET tube projects in good position.  No change in pulmonary edema.   Electronically Signed   By: Inge Rise M.D.   On: 12/21/2013 22:13   Dg Swallowing Func-speech Pathology  12/27/2013   Eden Emms, Student-SLP     12/27/2013 11:34 AM Objective Swallowing Evaluation: Modified Barium Swallowing Study   Patient Details  Name: WILHELMENIA ADDIS MRN: [id number] Date of Birth: 09-27-1925  Today's Date: 12/27/2013 Time: 3419-6222 SLP Time Calculation (min): 22 min  Past Medical History:  Past Medical History  Diagnosis Date  . Hypertension   . Parkinson's disease   . Hyperlipidemia   . Dizziness   . Orthostatic hypotension   . Anemia   . Tremor   . Breast cancer     LEFT BREAST  . Kidney failure     stage 3  . Hypothyroidism   . Prediabetes    Past Surgical History:  Past Surgical History  Procedure Laterality Date  . Cardiovascular stress test  06/2007    NORMAL  . Breast lumpectomy    . Cholecystectomy    . Tonsillectomy    . Nasal sinus surgery      submucous resection late 1950s   HPI:  19 F with Parkinson's Disease, CKD, HTN, pre-DM who presented to  [facility] on 10/16 with likely TIA difficulty in getting words out and  right arm felt funny, lasting about one hour and resolved.  Subsequently developed severe hypoxic respiratory failure almost  certainly 2/2 aspiration during MRI. Intubated  from 10/16 to  10/17. MRI showed no acute finding. Pt reproted in chart to have  a history of dysphagia and drinks nectar thick liquids at home  but no notes from SLP are available.      Assessment / Plan / Recommendation Clinical Impression  Dysphagia Diagnosis: Mild pharyngeal phase dysphagia Clinical impression: Pt demonstrates a mild pharyngeal dysphagia  with normal age related swallow changes. Mild penetration of thin  liquids is consistent and likely due to incomplete laryngeal  closure during the swallow related to reduced base of tongue  retraction and epiglottic deflection. SLP provided max verbal  cues for chin tuck which is effective in preventing aspiration,  but requires full supervision and maximal cueing. Penetrate is  also cleared with a throat clear and second swallow. Mild residue  is retained in the valleculae with solids and liquids, also  cleared with a second swallow. Independent consumption of nectar  thick liquids is also effective in preventing aspiration.  Aspiration risk may be higher at times given report of  fluctuating respiratory status from daughter. Given stable  respiratory function during exam, favor consumption of thin  liquids into long term. However, family may choose more  conservative diet to avoid related risks. Will discuss with pt's  daughter.    Treatment Recommendation  Therapy as outlined in treatment plan below    Diet Recommendation Regular;Thin liquid   Liquid Administration via: Straw Medication Administration: Whole meds with liquid Supervision: Full supervision/cueing for compensatory strategies Compensations: Slow rate;Small sips/bites;Clear throat after each  swallow Postural Changes and/or Swallow Maneuvers: Seated upright 90  degrees    Other  Recommendations Oral Care Recommendations: Oral care BID   Follow Up Recommendations  Inpatient Rehab    Frequency and Duration min 2x/week  2 weeks   Pertinent Vitals/Pain none    SLP Swallow Goals     General Date of  Onset: 12/21/13  HPI: 6 F with Parkinson's Disease, CKD, HTN, pre-DM who  presented to [facility] on 10/16 with likely TIA difficulty in getting  words out and right arm felt funny, lasting about one hour and  resolved. Subsequently developed severe hypoxic respiratory  failure almost certainly 2/2 aspiration during MRI. Intubated  from 10/16 to 10/17. MRI showed no acute finding. Pt reproted in  chart to have a history of dysphagia and drinks nectar thick  liquids at home but no notes from SLP are available.  Type of Study: Modified Barium Swallowing Study Reason for Referral: Objectively evaluate swallowing function Previous Swallow Assessment: MBS at [facility] in  September - regular textures, nectar thick liquids with a chin  tuck and a straw Diet Prior to this Study: Dysphagia 3 (soft);Nectar-thick liquids Temperature Spikes Noted: No Respiratory Status: Room air History of Recent Intubation: Yes Length of Intubations (days): 2 days Date extubated: 12/22/13 Behavior/Cognition: Alert;Cooperative;Requires cueing Oral Cavity - Dentition: Adequate natural dentition Oral Motor / Sensory Function: Within functional limits Self-Feeding Abilities: Able to feed self Patient Positioning: Upright in chair Baseline Vocal Quality: Clear Volitional Cough: Strong Volitional Swallow: Able to elicit Anatomy: Within functional limits    Reason for Referral Objectively evaluate swallowing function   Oral Phase Oral Preparation/Oral Phase Oral Phase: WFL   Pharyngeal Phase Pharyngeal Phase Pharyngeal Phase: Impaired Pharyngeal - Nectar Pharyngeal - Nectar Straw: Pharyngeal residue -  valleculae;Pharyngeal residue - pyriform sinuses Pharyngeal - Thin Pharyngeal - Thin Straw: Delayed swallow initiation;Premature  spillage to valleculae;Premature spillage to pyriform  sinuses;Penetration/Aspiration after swallow;Trace  aspiration;Compensatory strategies attempted (Comment) (chin  tuck) Penetration/Aspiration details (thin straw):  Material enters  airway, CONTACTS cords and not ejected out Pharyngeal - Solids Pharyngeal - Puree: Pharyngeal residue - valleculae Pharyngeal - Regular: Pharyngeal residue - valleculae  Cervical Esophageal Phase    GO              Eden Emms 12/27/2013, 11:33 AM    US Abdomen Limited Ruq  12/24/2013   CLINICAL DATA:  80 year old female with history of high blood pressure, breast cancer, pre diabetes, post cholecystectomy now with elevated transaminases. Initial encounter.  EXAM: US ABDOMEN LIMITED - RIGHT UPPER QUADRANT  COMPARISON:  None.  FINDINGS: Gallbladder:  Surgically removed.  Common bile duct:  Diameter: 4.6 mm.  Liver:  Heterogeneous echotexture which may represent fatty infiltration without focal mass or intrahepatic biliary duct dilation.  IMPRESSION: Post cholecystectomy.  No evidence of common bile duct or intrahepatic biliary duct dilation.  Heterogeneous echotexture liver without focal mass. This may represent result of fatty infiltration   Electronically Signed   By: Chauncey Cruel M.D.   On: 12/24/2013 15:55    ASSESSMENT/PLAN:  Hypertension - continue Spironolactone; increase Metoprolol 100 mg PO BID; BP/HR Q shift X 1 week Sjogren's syndrome - start Salagen 5 mg 1 tab PO Q D   CPT CODE: 62836    MEDINA-VARGAS,Dain Laseter, NP [place] [phone]

## 2014-01-07 NOTE — Telephone Encounter (Signed)
Patient is still having issues with elevated blood pressure, yesterday systolic 188-677 Systolic blood pressure today running from 160's-170's Patient has been scheduled to see  Dr. Mare Ferrari on 01/09/14 Will forward to  Dr. Mare Ferrari for review

## 2014-01-07 NOTE — Telephone Encounter (Signed)
New message     Pt is in [facility].  She is having high bp problems.  The family want Dr Mare Ferrari to continue to see her for her bp.  Will Dr Mare Ferrari come to [facility] or will they need to transport her here?

## 2014-01-07 NOTE — Telephone Encounter (Signed)
Advised daughter and will forward to Durenda Age NP

## 2014-01-08 NOTE — Addendum Note (Signed)
Addended by: Alvina Filbert B on: 01/08/2014 04:02 PM   Modules accepted: Orders, Medications

## 2014-01-09 ENCOUNTER — Ambulatory Visit (INDEPENDENT_AMBULATORY_CARE_PROVIDER_SITE_OTHER): Payer: Medicare Other | Admitting: Cardiology

## 2014-01-09 ENCOUNTER — Non-Acute Institutional Stay (SKILLED_NURSING_FACILITY): Payer: Medicare Other | Admitting: Adult Health

## 2014-01-09 ENCOUNTER — Encounter: Payer: Self-pay | Admitting: Adult Health

## 2014-01-09 VITALS — BP 152/66 | HR 79 | Ht 61.0 in | Wt 122.0 lb

## 2014-01-09 DIAGNOSIS — I5189 Other ill-defined heart diseases: Secondary | ICD-10-CM

## 2014-01-09 DIAGNOSIS — I519 Heart disease, unspecified: Secondary | ICD-10-CM | POA: Insufficient documentation

## 2014-01-09 DIAGNOSIS — E785 Hyperlipidemia, unspecified: Secondary | ICD-10-CM

## 2014-01-09 DIAGNOSIS — I119 Hypertensive heart disease without heart failure: Secondary | ICD-10-CM

## 2014-01-09 DIAGNOSIS — I10 Essential (primary) hypertension: Secondary | ICD-10-CM

## 2014-01-09 DIAGNOSIS — I5030 Unspecified diastolic (congestive) heart failure: Secondary | ICD-10-CM

## 2014-01-09 DIAGNOSIS — G459 Transient cerebral ischemic attack, unspecified: Secondary | ICD-10-CM

## 2014-01-09 DIAGNOSIS — E039 Hypothyroidism, unspecified: Secondary | ICD-10-CM

## 2014-01-09 MED ORDER — FUROSEMIDE 20 MG PO TABS: 20.0000 mg | ORAL_TABLET | Freq: Every day | ORAL | Status: DC

## 2014-01-09 NOTE — Patient Instructions (Signed)
START LASIX (FUROSEMIDE) 20 MG DAILY  Your physician recommends that you schedule a follow-up appointment in: 2 MONTH OV/BMET

## 2014-01-09 NOTE — Progress Notes (Signed)
Victoria Lindsey Date of Birth:  22-Oct-1925 [place] [address] [place], [place]  [address] [phone]        Fax   [phone]   History of Present Illness: This pleasant 80 year old woman is seen for a work in posthospital followup office visit. The patient was hospitalized from October 16 through 12/27/2013.  She was placed in the hospital because of initial concern about possible TIA.  She was also having markedly labile blood pressure readings.  She was seen in mid August 2015 because of recent elevation of her blood pressure.  She has a history of essential hypertension. She has a past history of a TIAs and is on aspirin and Plavix. She has a history of Parkinson's disease and a history of Sjogren's syndrome. She does not have any known ischemic heart disease and she had a normal nuclear stress test in 2009.  Marland Kitchen  There was mild aortic insufficiency and mild mitral regurgitation.  Her last chest x-ray in March 2014 showed borderline cardiomegaly She has stage III chronic kidney disease. She also had problems with her urinary bladder. Since last visit her neurologist Dr. Baltazar Lindsey in [facility] has switched her to a new patch for Parkinson's disease. Since last visit she has had no new cardiovascular symptoms.  She fell in July and suffered a compression fracture of her spine.  Over the past month she has had problems with recurrent urinary infections.  She is now taking Keflex 250 mg each night for the next 90 days.  Victoria Lindsey has been coming out to the house and checking her blood pressures.  Her last echocardiogram was 10/25/13 and showed a hyperdynamic left ventricle with ejection fraction 75-80%.  Since that at no she has been on a higher dose of beta blocker and has felt better.the echocardiogram also showed diastolic dysfunction. While hospitalized in October 2015 the patient developed acute respiratory distress while she was in the MRI machine.  There was a question as  to whether she might have aspirated.  She was intubated and was on a ventilator for 14 hours.  She improved quickly.  Chest x-ray showed marked bilateral densities raising question of heart failure versus aspiration pneumonia.   Current Outpatient Prescriptions  Medication Sig Dispense Refill  . acetaminophen (TYLENOL) 500 MG tablet Take 500 mg by mouth every 6 (six) hours as needed (pain).    Marland Kitchen antiseptic oral rinse (BIOTENE) LIQD 15 mLs by Mouth Rinse route as needed for dry mouth.    Marland Kitchen aspirin 81 MG tablet Take 81 mg by mouth every evening.     Marland Kitchen BIOTIN PO Take 1,000 mcg by mouth daily.     . clopidogrel (PLAVIX) 75 MG tablet Take 75 mg by mouth every evening.    . cephALEXin (KEFLEX) 250 MG capsule Take 250 mg by mouth at bedtime. Take 1 at bedtime.    . Cholecalciferol (VITAMIN D3) 2000 UNITS TABS Take 1 tablet by mouth daily.     . Cranberry 500 MG CAPS Take 500 mg by mouth daily.     Marland Kitchen docusate sodium (COLACE) 100 MG capsule Take 100 mg by mouth daily.    Marland Kitchen FOLIC ACID PO Take 0.6 mg by mouth daily.     . furosemide (LASIX) 20 MG tablet Take 1 tablet (20 mg total) by mouth daily. 30 tablet 11  . hydroxypropyl methylcellulose / hypromellose (ISOPTO TEARS / GONIOVISC) 2.5 % ophthalmic solution Place 1 drop into both eyes  2 (two) times daily.    Marland Kitchen levothyroxine (SYNTHROID, LEVOTHROID) 100 MCG tablet Take 112 mcg by mouth daily.     . metoprolol (LOPRESSOR) 50 MG tablet Take 100 mg by mouth 2 (two) times daily. Take 2 tablets twice a day    . Multiple Vitamin (MULTIVITAMIN WITH MINERALS) TABS tablet Take 1 tablet by mouth daily.    . Probiotic Product (ALIGN PO) Take 1 capsule by mouth every evening.     . rasagiline (AZILECT) 1 MG TABS Take 1 mg by mouth daily.    . ReliOn Ultra Thin Lancets MISC by Does not apply route.    . rosuvastatin (CRESTOR) 10 MG tablet Take 10 mg by mouth every evening.     . Rotigotine 8 MG/24HR PT24 Place 1 patch onto the skin daily.     Marland Kitchen spironolactone  (ALDACTONE) 25 MG tablet Take 1 tablet (25 mg total) by mouth daily. 45 tablet 3   No current facility-administered medications for this visit.    Allergies  Allergen Reactions  . Aciphex [Rabeprazole Sodium]   . Amantadines   . Avapro [Irbesartan]   . Carbidopa-Levodopa   . Cardura [Doxazosin Mesylate]   . Ciprofloxacin   . Clonidine Derivatives   . Cozaar   . Diltiazem   . Famotidine   . Hctz [Hydrochlorothiazide]   . Indapamide   . Lansoprazole   . Lisinopril     Mouth problems  . Loratadine   . Maxidex [Dexamethasone]   . Mirapex [Pramipexole Dihydrochloride]   . Norvasc [Amlodipine Besylate]   . Pantoprazole Sodium   . Penicillins   . Prilosec [Omeprazole]   . Procardia [Nifedipine]   . Proton Pump Inhibitors   . Sulfa Drugs Cross Reactors   . Trimethoprim   . Zyrtec [Cetirizine Hcl]     Patient Active Problem List   Diagnosis Date Noted  . Left ventricular diastolic dysfunction with preserved systolic function 59/93/5701  . Essential hypertension 12/30/2013  . Transaminitis 12/24/2013  . Palpitations 12/23/2013  . Respiratory insufficiency 12/22/2013  . Normocytic anemia 12/21/2013  . Thrombocytopenia 12/21/2013  . Dyspnea 06/15/2012  . Hyperglycemia 12/27/2011  . Sjogren's syndrome 12/17/2010  . TIA (transient ischemic attack) 12/17/2010  . Benign hypertensive heart disease without heart failure 09/01/2010  . Parkinson's disease 09/01/2010  . Hypothyroidism 09/01/2010  . Hypercholesterolemia 09/01/2010  . Chronic anemia 09/01/2010    History  Smoking status  . Former Smoker -- 0.50 packs/day for 12 years  . Types: Cigarettes  . Quit date: 03/08/1964  Smokeless tobacco  . Not on file    History  Alcohol Use No    Family History  Problem Relation Age of Onset  . Heart disease Mother   . Heart failure Father   . Parkinsonism Father     Review of Systems: Constitutional: no fever chills diaphoresis or fatigue or change in weight.  Head  and neck: no hearing loss, no epistaxis, no photophobia or visual disturbance. Respiratory: No cough, shortness of breath or wheezing. Cardiovascular: No chest pain peripheral edema, palpitations. Gastrointestinal: No abdominal distention, no abdominal pain, no change in bowel habits hematochezia or melena. Genitourinary: No dysuria, no frequency, no urgency, no nocturia. Musculoskeletal:No arthralgias, no back pain, no gait disturbance or myalgias. Neurological: No dizziness, no headaches, no numbness, no seizures, no syncope, no weakness, no tremors. Hematologic: No lymphadenopathy, no easy bruising. Psychiatric: No confusion, no hallucinations, no sleep disturbance.    Physical Exam: Filed Vitals:   01/09/14 1317  BP:  152/66  Pulse: 79   general appearance reveals a well-developed well-nourished elderly woman in no distress.  Repeat blood pressure by me was 152/72 right arm.The head and neck exam reveals pupils equal and reactive.  Extraocular movements are full.  There is no scleral icterus.  The mouth and pharynx are normal.  The neck is supple.  The carotids reveal no bruits.  The jugular venous pressure is normal.  The  thyroid is not enlarged.  There is no lymphadenopathy.  The chest reveals bilateral inspiratory rales at the bases. Expansion of the chest is symmetrical.  The precordium is quiet.  The first heart sound is normal.  The second heart sound is physiologically split.  There is no murmur gallop rub or click.  There is no abnormal lift or heave.  The abdomen is soft and nontender.  The bowel sounds are normal.  The liver and spleen are not enlarged.  There are no abdominal masses.  There are no abdominal bruits.  Extremities reveal good pedal pulses.  There is moderate pretibial edema.  There is no cyanosis or clubbing.  Strength is normal and symmetrical in all extremities.  There is no lateralizing weakness.  There are no sensory deficits.  The skin is warm and dry.  There is no  rash.   EKG shows normal sinus rhythm and nonspecific ST-T wave abnormalities increased from 12/26/48  Assessment / Plan: 1.  Essential hypertension with hyperdynamic left ventricular ejection fraction 75-80% prior to increasing beta blocker dose. 2. Dyspnea 3. past history of TIAs 4. Parkinsonism 5. recent compression fracture from a fall in July 20 15th 6. chronic kidney disease stage III 7. Left ventricular diastolic dysfunction by echo  Plan: the patient is still at [facility] place.  They are monitoring her carefully.  We will start furosemide 20 mg daily for additional diuresis.recheck in 2 months for office visit and basal metabolic panel.

## 2014-01-09 NOTE — Progress Notes (Signed)
Patient ID: Victoria Lindsey, female   DOB: 1926/01/10, 80 y.o.   MRN: [id number]   01/09/2014  Facility:  [facility] Home Location:  [place] Room Number: 1206-P LEVEL OF CARE:  SNF (31)   Chief Complaint  Patient presents with  . Acute Visit    Hypothyroidism    HISTORY OF PRESENT ILLNESS:  This is an 80 year old female who was noted to have tsh  9.76 - elevated. She is currently taking Synthroid for hypothyroidism. She had complained of constipation and feeling tired.  PAST MEDICAL HISTORY:  Past Medical History  Diagnosis Date  . Hypertension   . Parkinson's disease   . Hyperlipidemia   . Dizziness   . Orthostatic hypotension   . Anemia   . Tremor   . Breast cancer     LEFT BREAST  . Kidney failure     stage 3  . Hypothyroidism   . Prediabetes     CURRENT MEDICATIONS: Reviewed per MAR/see medication list  Allergies  Allergen Reactions  . Aciphex [Rabeprazole Sodium]   . Amantadines   . Avapro [Irbesartan]   . Carbidopa-Levodopa   . Cardura [Doxazosin Mesylate]   . Ciprofloxacin   . Clonidine Derivatives   . Cozaar   . Diltiazem   . Famotidine   . Hctz [Hydrochlorothiazide]   . Indapamide   . Lansoprazole   . Lisinopril     Mouth problems  . Loratadine   . Maxidex [Dexamethasone]   . Mirapex [Pramipexole Dihydrochloride]   . Norvasc [Amlodipine Besylate]   . Pantoprazole Sodium   . Penicillins   . Prilosec [Omeprazole]   . Procardia [Nifedipine]   . Proton Pump Inhibitors   . Sulfa Drugs Cross Reactors   . Trimethoprim   . Zyrtec [Cetirizine Hcl]      REVIEW OF SYSTEMS:  GENERAL: poor appetite, no fatigue, no fever, chills or weakness RESPIRATORY: no cough, SOB, DOE, wheezing, hemoptysis CARDIAC: no chest pain, palpitations, + edema GI: no abdominal pain, diarrhea, heart burn, nausea or vomiting, +constipation  PHYSICAL EXAMINATION  GENERAL: no acute distress, normal body habitus NECK: supple, trachea  midline, no neck masses, no thyroid tenderness, no thyromegaly RESPIRATORY: breathing is even & unlabored, BS CTAB CARDIAC: RRR, no murmur,no extra heart sounds, BLE edema 2+ GI: abdomen soft, normal BS, no masses, no tenderness, no hepatomegaly, no splenomegaly EXTREMITIES: able to move all 4 extremities PSYCHIATRIC: the patient is alert & oriented to person, affect & behavior appropriate  LABS/RADIOLOGY: 01/07/14  Sodium 132 potassium 4.4 glucose 75 BUN 21 creatinine 0.6 calcium 9.0 TSH 9.76 01/02/14  Wbc 6.2  hgb 9.1  hct 28.8 MCV 98.3 NA 133 K 4.7 glucose 99  BUN 19  Creatinine 0.6  CA 8.9  Alb 2.8   Labs reviewed: Basic Metabolic Panel:  Recent Labs  12/24/13 0309 12/25/13 0844 12/26/13 0555  NA 136* 139 139  K 4.0 4.0 4.4  CL 99 101 103  CO2 26 26 26   GLUCOSE 113* 134* 97  BUN 31* 31* 32*  CREATININE 0.68 0.63 0.68  CALCIUM 9.1 9.2 8.8   Liver Function Tests:  Recent Labs  12/21/13 2300 12/22/13 0239 12/25/13 0844  AST 56* 58* 67*  ALT 55* 57* 51*  ALKPHOS 135* 136* 128*  BILITOT 0.3 0.4 0.5  PROT 6.7 6.9 6.8  ALBUMIN 2.9* 3.0* 3.2*   CBC:  Recent Labs  10/18/13 1436 12/21/13 1514  12/22/13 0239 12/24/13 0309 12/26/13 0555  WBC 5.6  4.8  < > 7.9 5.0 5.4  NEUTROABS 3.0 2.6  --   --   --   --   HGB 11.2* 11.1*  < > 10.9* 10.6* 10.1*  HCT 33.3* 33.9*  < > 33.7* 32.4* 31.3*  MCV 97.5 95.5  < > 97.7 95.6 98.1  PLT 142.0* 85*  < > 115* 107* 126*  < > = values in this interval not displayed.  Lipid Panel:  Recent Labs  12/22/13 0239  HDL 41   Cardiac Enzymes:  Recent Labs  12/26/13 2239 12/27/13 0321 12/27/13 1210  TROPONINI <0.30 <0.30 <0.30   CBG:  Recent Labs  12/23/13 0812 12/23/13 1203 12/23/13 1638  GLUCAP 80 94 211*    Dg Chest 2 View  12/21/2013   CLINICAL DATA:  Hypertension.  Headache and dizziness  EXAM: CHEST  2 VIEW  COMPARISON:  11/21/2013  FINDINGS: Cardiac enlargement without heart failure. Elevated left  hemidiaphragm and left lower lobe atelectasis/scarring unchanged. Negative for pneumonia. Negative for pleural effusion  Moderate to severe chronic compression fracture approximately L1 unchanged from the prior study.  IMPRESSION: Chronic lung disease is stable.  No superimposed acute abnormality.   Electronically Signed   By: Franchot Gallo M.D.   On: 12/21/2013 14:26   Ct Head Wo Contrast  12/21/2013   CLINICAL DATA:  Parkinson's.  Recent fall.  Fell and hit head today  EXAM: CT HEAD WITHOUT CONTRAST  TECHNIQUE: Contiguous axial images were obtained from the base of the skull through the vertex without intravenous contrast.  COMPARISON:  MRI 04/12/2012  FINDINGS: Generalized atrophy. Mild chronic microvascular ischemic change in the white matter.  Negative for acute infarct.  Negative for hemorrhage or mass.  Negative for skull fracture. Atherosclerotic disease in the cavernous carotid bilaterally. Mucosal thickening left sphenoid sinus.  IMPRESSION: Atrophy and chronic microvascular ischemia.  No acute abnormality   Electronically Signed   By: Franchot Gallo M.D.   On: 12/21/2013 15:48   Ct Angio Chest W/cm &/or Wo Cm  12/04/2013   CLINICAL DATA:  Increased shortness of breath. Fall with back fractures in July. History of breast cancer 5 years ago.  EXAM: CT ANGIOGRAPHY CHEST WITH CONTRAST  TECHNIQUE: Multidetector CT imaging of the chest was performed using the standard protocol during bolus administration of intravenous contrast. Multiplanar CT image reconstructions and MIPs were obtained to evaluate the vascular anatomy.  CONTRAST:  17mL OMNIPAQUE IOHEXOL 350 MG/ML SOLN  COMPARISON:  11/21/2013 chest radiograph  FINDINGS: Breathing motion artifact obscures the lung bases.  Subject to this mild limitation, no filling defect is identified in the pulmonary arterial tree to suggest pulmonary embolus.  Thoracic aortic atherosclerotic calcification noted with some mural thrombus along the descending thoracic  aorta. There is likely some degree of coronary atherosclerosis. Mildly enlarged cardiopericardial silhouette. Interventricular septal thickness 1.5 cm, borderline for left ventricular hypertrophy.  Cystic lesion in the left breast 2.8 by 3.5 cm, image 46 series 4. Adjacent pleural thickening and subpleural interstitial accentuation suggesting radiation therapy in the region.  Subacute left anterior third and fourth rib fractures could be posttraumatic or due to the localized radiation therapy causing weakening in the bone. Mild adjacent pleural thickening noted.  Thoracic spondylosis. Slight superior endplate concavities at T2 and T3 are age indeterminate.  Mild mosaic attenuation in the lungs with equal visibility of vessels in the darker regions. Bilateral airway thickening in the lower lobes.  Review of the MIP images confirms the above findings.  IMPRESSION: 1.  No pulmonary embolus is identified. 2. Subacute left anterior third and fourth rib fractures. This is in the vicinity of localized radiation therapy, which may have predispose to fracture. Mild adjacent pleural thickening. 3. Cystic lesion in the left breast 2.8 by 3.5 cm, probably post procedural. 4. Mild superior endplate concavities at T2 and T3, age indeterminate, likely subtle superior endplate compression fractures. 5. Mosaic attenuation in the lungs may be from low-grade alveolar infiltration from edema, or air trapping. There is notable airway thickening in both lower lobes. 6. Atherosclerosis.   Electronically Signed   By: Sherryl Barters M.D.   On: 12/04/2013 15:56   Mr Brain Wo Contrast  12/21/2013   CLINICAL DATA:  Episode of speech disturbance and right-sided weakness.  EXAM: MRI HEAD WITHOUT CONTRAST  TECHNIQUE: Multiplanar, multiecho pulse sequences of the brain and surrounding structures were obtained without intravenous contrast.  COMPARISON:  Head CT same day.  MRI 04/12/2012.  FINDINGS: Diffusion imaging does not show any acute or  subacute infarction. The brainstem is normal. There are a few old small vessel cerebellar infarctions. There are a few old small vessel infarctions affecting the thalami basal ganglia. There are moderate chronic small-vessel ischemic changes throughout the deep and subcortical white matter. No cortical or large vessel territory infarction. No mass lesion, hemorrhage, hydrocephalus or extra-axial collection. There is arachnoid herniation into the sella but no pituitary mass. No inflammatory sinus disease. No skull or skullbase lesion.  IMPRESSION: No acute or subacute infarction. Moderate chronic small-vessel ischemic changes throughout the brain as outlined above.   Electronically Signed   By: Nelson Chimes M.D.   On: 12/21/2013 18:44   Dg Chest Port 1 View  12/21/2013   CLINICAL DATA:  Status post intubation.  EXAM: PORTABLE CHEST - 1 VIEW  COMPARISON:  Single view of the chest 12/21/2013 at 1922 hr.  FINDINGS: The patient has a new endotracheal tube in place with the tip in good position at the level of the clavicular heads. NG tube is also in good position with the tip in the stomach. The chest is better expanded with decreased atelectasis. Patchy bilateral airspace disease likely due to edema again seen. Cardiomegaly.  IMPRESSION: ET tube and NG tube project in good position.  Decreased atelectasis with pulmonary edema again seen.   Electronically Signed   By: Inge Rise M.D.   On: 12/21/2013 21:49   Dg Chest Port 1 View  12/21/2013   CLINICAL DATA:  Acute hypoxia.  EXAM: PORTABLE CHEST - 1 VIEW  COMPARISON:  PA and lateral chest earlier this same day.  FINDINGS: Since the earlier study, extensive bilateral airspace disease has developed. Heart size is enlarged. There is small left pleural effusion. No pneumothorax.  IMPRESSION: New, extensive bilateral airspace disease is likely due to pulmonary edema.   Electronically Signed   By: Inge Rise M.D.   On: 12/21/2013 19:59   Dg Chest Port 1v  Same Day  12/21/2013   CLINICAL DATA:  Status post repositioning of endotracheal tube.  EXAM: PORTABLE CHEST - 1 VIEW SAME DAY  COMPARISON:  Single view of the chest 12/21/2013 at 2133 hr  FINDINGS: ET tube is in place with the tip in good position at the inferior aspect of the clavicular heads. NG tube tip is in the stomach. Patchy bilateral airspace disease and cardiomegaly appear unchanged.  IMPRESSION: ET tube projects in good position.  No change in pulmonary edema.   Electronically Signed   By: Inge Rise  M.D.   On: 12/21/2013 22:13   Dg Swallowing Func-speech Pathology  12/27/2013   Eden Emms, [place]     12/27/2013 11:34 AM Objective Swallowing Evaluation: Modified Barium Swallowing Study   Patient Details  Name: DODI LEU MRN: [id number] Date of Birth: 06-04-1925  Today's Date: 12/27/2013 Time: 7062-3762 SLP Time Calculation (min): 22 min  Past Medical History:  Past Medical History  Diagnosis Date  . Hypertension   . Parkinson's disease   . Hyperlipidemia   . Dizziness   . Orthostatic hypotension   . Anemia   . Tremor   . Breast cancer     LEFT BREAST  . Kidney failure     stage 3  . Hypothyroidism   . Prediabetes    Past Surgical History:  Past Surgical History  Procedure Laterality Date  . Cardiovascular stress test  06/2007    NORMAL  . Breast lumpectomy    . Cholecystectomy    . Tonsillectomy    . Nasal sinus surgery      submucous resection late 1950s   HPI:  23 F with Parkinson's Disease, CKD, HTN, pre-DM who presented to  [facility] on 10/16 with likely TIA difficulty in getting words out and  right arm felt funny, lasting about one hour and resolved.  Subsequently developed severe hypoxic respiratory failure almost  certainly 2/2 aspiration during MRI. Intubated from 10/16 to  10/17. MRI showed no acute finding. Pt reproted in chart to have  a history of dysphagia and drinks nectar thick liquids at home  but no notes from SLP are available.      Assessment / Plan /  Recommendation Clinical Impression  Dysphagia Diagnosis: Mild pharyngeal phase dysphagia Clinical impression: Pt demonstrates a mild pharyngeal dysphagia  with normal age related swallow changes. Mild penetration of thin  liquids is consistent and likely due to incomplete laryngeal  closure during the swallow related to reduced base of tongue  retraction and epiglottic deflection. SLP provided max verbal  cues for chin tuck which is effective in preventing aspiration,  but requires full supervision and maximal cueing. Penetrate is  also cleared with a throat clear and second swallow. Mild residue  is retained in the valleculae with solids and liquids, also  cleared with a second swallow. Independent consumption of nectar  thick liquids is also effective in preventing aspiration.  Aspiration risk may be higher at times given report of  fluctuating respiratory status from daughter. Given stable  respiratory function during exam, favor consumption of thin  liquids into long term. However, family may choose more  conservative diet to avoid related risks. Will discuss with pt's  daughter.    Treatment Recommendation  Therapy as outlined in treatment plan below    Diet Recommendation Regular;Thin liquid   Liquid Administration via: Straw Medication Administration: Whole meds with liquid Supervision: Full supervision/cueing for compensatory strategies Compensations: Slow rate;Small sips/bites;Clear throat after each  swallow Postural Changes and/or Swallow Maneuvers: Seated upright 90  degrees    Other  Recommendations Oral Care Recommendations: Oral care BID   Follow Up Recommendations  Inpatient Rehab    Frequency and Duration min 2x/week  2 weeks   Pertinent Vitals/Pain none    SLP Swallow Goals     General Date of Onset: 12/21/13 HPI: 76 F with Parkinson's Disease, CKD, HTN, pre-DM who  presented to [facility] on 10/16 with likely TIA difficulty in getting  words out and right arm felt funny, lasting about one hour and  resolved. Subsequently developed severe hypoxic respiratory  failure almost certainly 2/2 aspiration during MRI. Intubated  from 10/16 to 10/17. MRI showed no acute finding. Pt reproted in  chart to have a history of dysphagia and drinks nectar thick  liquids at home but no notes from SLP are available.  Type of Study: Modified Barium Swallowing Study Reason for Referral: Objectively evaluate swallowing function Previous Swallow Assessment: MBS at [facility] in  September - regular textures, nectar thick liquids with a chin  tuck and a straw Diet Prior to this Study: Dysphagia 3 (soft);Nectar-thick liquids Temperature Spikes Noted: No Respiratory Status: Room air History of Recent Intubation: Yes Length of Intubations (days): 2 days Date extubated: 12/22/13 Behavior/Cognition: Alert;Cooperative;Requires cueing Oral Cavity - Dentition: Adequate natural dentition Oral Motor / Sensory Function: Within functional limits Self-Feeding Abilities: Able to feed self Patient Positioning: Upright in chair Baseline Vocal Quality: Clear Volitional Cough: Strong Volitional Swallow: Able to elicit Anatomy: Within functional limits    Reason for Referral Objectively evaluate swallowing function   Oral Phase Oral Preparation/Oral Phase Oral Phase: WFL   Pharyngeal Phase Pharyngeal Phase Pharyngeal Phase: Impaired Pharyngeal - Nectar Pharyngeal - Nectar Straw: Pharyngeal residue -  valleculae;Pharyngeal residue - pyriform sinuses Pharyngeal - Thin Pharyngeal - Thin Straw: Delayed swallow initiation;Premature  spillage to valleculae;Premature spillage to pyriform  sinuses;Penetration/Aspiration after swallow;Trace  aspiration;Compensatory strategies attempted (Comment) (chin  tuck) Penetration/Aspiration details (thin straw): Material enters  airway, CONTACTS cords and not ejected out Pharyngeal - Solids Pharyngeal - Puree: Pharyngeal residue - valleculae Pharyngeal - Regular: Pharyngeal residue - valleculae  Cervical  Esophageal Phase    GO              Eden Emms 12/27/2013, 11:33 AM    US Abdomen Limited Ruq  12/24/2013   CLINICAL DATA:  80 year old female with history of high blood pressure, breast cancer, pre diabetes, post cholecystectomy now with elevated transaminases. Initial encounter.  EXAM: US ABDOMEN LIMITED - RIGHT UPPER QUADRANT  COMPARISON:  None.  FINDINGS: Gallbladder:  Surgically removed.  Common bile duct:  Diameter: 4.6 mm.  Liver:  Heterogeneous echotexture which may represent fatty infiltration without focal mass or intrahepatic biliary duct dilation.  IMPRESSION: Post cholecystectomy.  No evidence of common bile duct or intrahepatic biliary duct dilation.  Heterogeneous echotexture liver without focal mass. This may represent result of fatty infiltration   Electronically Signed   By: Chauncey Cruel M.D.   On: 12/24/2013 15:55    ASSESSMENT/PLAN:  Hypothyroidism - increase Synthroid to 112 g 1 tab by mouth daily; TSH in 6 weeks  CPT CODE: 32023    MEDINA-VARGAS,Darriel Utter, [place]

## 2014-01-09 NOTE — Assessment & Plan Note (Signed)
Her blood pressure continues to be labile.  Normally her oxygen saturations had been in the range of 95%.  More recently they are 90%.  She is also had increasing pretibial and ankle edema.  We will add a stronger diuretic.

## 2014-01-09 NOTE — Assessment & Plan Note (Signed)
Her only diuretic has been a low dose of spironolactone.  We will add furosemide 20 mg daily.  She may need to go up on the furosemide dose if she fails to respond in terms of diuresis and improvement in peripheral edema.

## 2014-01-09 NOTE — Assessment & Plan Note (Signed)
The patient has had no further TIA symptoms since discharge from the hospital.

## 2014-01-21 ENCOUNTER — Encounter: Payer: Self-pay | Admitting: Adult Health

## 2014-01-21 ENCOUNTER — Non-Acute Institutional Stay (SKILLED_NURSING_FACILITY): Payer: Medicare Other | Admitting: Adult Health

## 2014-01-21 DIAGNOSIS — E039 Hypothyroidism, unspecified: Secondary | ICD-10-CM

## 2014-01-21 DIAGNOSIS — E7800 Pure hypercholesterolemia, unspecified: Secondary | ICD-10-CM

## 2014-01-21 DIAGNOSIS — G459 Transient cerebral ischemic attack, unspecified: Secondary | ICD-10-CM

## 2014-01-21 DIAGNOSIS — M3500 Sicca syndrome, unspecified: Secondary | ICD-10-CM

## 2014-01-21 DIAGNOSIS — G20 Parkinson's disease: Secondary | ICD-10-CM

## 2014-01-21 DIAGNOSIS — I10 Essential (primary) hypertension: Secondary | ICD-10-CM

## 2014-01-21 NOTE — Progress Notes (Signed)
Patient ID: Victoria Lindsey, female   DOB: Jul 20, 1925, 80 y.o.   MRN: [id number]   01/21/2014  Facility:  Nursing Home Location:  [place] Room Number: 1206-P LEVEL OF CARE:  SNF (31)   Chief Complaint  Patient presents with  . Discharge Note    Hypertension, Hypothyroidism, Sjogren's syndrome, Hyperlipidemia and Parkinson's disaease    HISTORY OF PRESENT ILLNESS:  This is an 80 year old female who is for discharge home with Home health PT. OT, ST, Nuursing and CNA. DME: 18" X 18" wheelchair with elevating leg rests and wheelchair cushion. She has been admitted to [facility] on 12/27/13 from [facility] with acute respiratory failure. Patient was admitted to this facility for short-term rehabilitation after the patient's recent hospitalization.  Patient has completed SNF rehabilitation and therapy has cleared the patient for discharge.  REASSESSMENT OF ONGOING PROBLEMS:  HTN: Pt 's  Denies CP, sob, DOE, headaches, dizziness or visual disturbances.  No complications from the medications currently being used.  Metoprolol dosage was recently increased. BP is uncontrolled. BPs 168/62, 159/67, 172/69, 177/71  HYPOTHYROIDISM:  No complications noted from the medications presently being used.  The patient denies fatigue or constipation. Synthroid dosage was recently increased 11/15 TSH 9.76  PARKINSON'S DISEASE: pt's Parkinson's disease is stable.  Denies progression of sx recently.  Pt is tolerating Parkinson's disease medications without any complications.  PAST MEDICAL HISTORY:  Past Medical History  Diagnosis Date  . Hypertension   . Parkinson's disease   . Hyperlipidemia   . Dizziness   . Orthostatic hypotension   . Anemia   . Tremor   . Breast cancer     LEFT BREAST  . Kidney failure     stage 3  . Hypothyroidism   . Prediabetes     CURRENT MEDICATIONS: Reviewed per MAR/see medication list  Allergies  Allergen Reactions  .  Aciphex [Rabeprazole Sodium]   . Amantadines   . Avapro [Irbesartan]   . Carbidopa-Levodopa   . Cardura [Doxazosin Mesylate]   . Ciprofloxacin   . Clonidine Derivatives   . Cozaar   . Diltiazem   . Famotidine   . Hctz [Hydrochlorothiazide]   . Indapamide   . Lansoprazole   . Lisinopril     Mouth problems  . Loratadine   . Maxidex [Dexamethasone]   . Mirapex [Pramipexole Dihydrochloride]   . Norvasc [Amlodipine Besylate]   . Pantoprazole Sodium   . Penicillins   . Prilosec [Omeprazole]   . Procardia [Nifedipine]   . Proton Pump Inhibitors   . Sulfa Drugs Cross Reactors   . Trimethoprim   . Zyrtec [Cetirizine Hcl]      REVIEW OF SYSTEMS:  GENERAL: no change in appetite, no fatigue, no weight changes, no fever, chills or weakness RESPIRATORY: no cough, SOB, DOE, wheezing, hemoptysis CARDIAC: no chest pain, or palpitations, +edema GI: no abdominal pain, diarrhea, constipation, heart burn, nausea or vomiting  PHYSICAL EXAMINATION  GENERAL: no acute distress, normal body habitus NECK: supple, trachea midline, no neck masses, no thyroid tenderness, no thyromegaly LYMPHATICS: no LAN in the neck, no supraclavicular LAN RESPIRATORY: breathing is even & unlabored, BS CTAB CARDIAC: RRR, no murmur,no extra heart sounds, BLE edema 1+ GI: abdomen soft, normal BS, no masses, no tenderness, no hepatomegaly, no splenomegaly EXTREMITIES: able to move all 4 extremities PSYCHIATRIC: the patient is alert & oriented to person, affect & behavior appropriate  LABS/RADIOLOGY: 01/07/14  BNP 1130 sodium 132 potassium 4.4 glucose  75 BUN 21 creatinine 0.6 calcium 9.0  TSH 9.76 01/01/14  WBC 6.2 hemoglobin 9.1 hematocrit 28.8 MCV 98.3 sodium 133 potassium 4.7 glucose 99 BUN 19 creatinine 0.6 calcium 8.9 total protein 5.4 albumin Green 2.8 total bilirubin 0.4 AST 54 ALT 60 Labs reviewed: Basic Metabolic Panel:  Recent Labs  12/24/13 0309 12/25/13 0844 12/26/13 0555  NA 136* 139 139  K  4.0 4.0 4.4  CL 99 101 103  CO2 26 26 26   GLUCOSE 113* 134* 97  BUN 31* 31* 32*  CREATININE 0.68 0.63 0.68  CALCIUM 9.1 9.2 8.8   Liver Function Tests:  Recent Labs  12/21/13 2300 12/22/13 0239 12/25/13 0844  AST 56* 58* 67*  ALT 55* 57* 51*  ALKPHOS 135* 136* 128*  BILITOT 0.3 0.4 0.5  PROT 6.7 6.9 6.8  ALBUMIN 2.9* 3.0* 3.2*   CBC:  Recent Labs  10/18/13 1436 12/21/13 1514  12/22/13 0239 12/24/13 0309 12/26/13 0555  WBC 5.6 4.8  < > 7.9 5.0 5.4  NEUTROABS 3.0 2.6  --   --   --   --   HGB 11.2* 11.1*  < > 10.9* 10.6* 10.1*  HCT 33.3* 33.9*  < > 33.7* 32.4* 31.3*  MCV 97.5 95.5  < > 97.7 95.6 98.1  PLT 142.0* 85*  < > 115* 107* 126*  < > = values in this interval not displayed.  Lipid Panel:  Recent Labs  12/22/13 0239  HDL 41   Cardiac Enzymes:  Recent Labs  12/26/13 2239 12/27/13 0321 12/27/13 1210  TROPONINI <0.30 <0.30 <0.30   CBG:  Recent Labs  12/23/13 0812 12/23/13 1203 12/23/13 1638  GLUCAP 80 94 211*    Dg Chest 2 View  12/21/2013   CLINICAL DATA:  Hypertension.  Headache and dizziness  EXAM: CHEST  2 VIEW  COMPARISON:  11/21/2013  FINDINGS: Cardiac enlargement without heart failure. Elevated left hemidiaphragm and left lower lobe atelectasis/scarring unchanged. Negative for pneumonia. Negative for pleural effusion  Moderate to severe chronic compression fracture approximately L1 unchanged from the prior study.  IMPRESSION: Chronic lung disease is stable.  No superimposed acute abnormality.   Electronically Signed   By: Franchot Gallo M.D.   On: 12/21/2013 14:26   Ct Head Wo Contrast  12/21/2013   CLINICAL DATA:  Parkinson's.  Recent fall.  Fell and hit head today  EXAM: CT HEAD WITHOUT CONTRAST  TECHNIQUE: Contiguous axial images were obtained from the base of the skull through the vertex without intravenous contrast.  COMPARISON:  MRI 04/12/2012  FINDINGS: Generalized atrophy. Mild chronic microvascular ischemic change in the white  matter.  Negative for acute infarct.  Negative for hemorrhage or mass.  Negative for skull fracture. Atherosclerotic disease in the cavernous carotid bilaterally. Mucosal thickening left sphenoid sinus.  IMPRESSION: Atrophy and chronic microvascular ischemia.  No acute abnormality   Electronically Signed   By: Franchot Gallo M.D.   On: 12/21/2013 15:48   Ct Angio Chest W/cm &/or Wo Cm  12/04/2013   CLINICAL DATA:  Increased shortness of breath. Fall with back fractures in July. History of breast cancer 5 years ago.  EXAM: CT ANGIOGRAPHY CHEST WITH CONTRAST  TECHNIQUE: Multidetector CT imaging of the chest was performed using the standard protocol during bolus administration of intravenous contrast. Multiplanar CT image reconstructions and MIPs were obtained to evaluate the vascular anatomy.  CONTRAST:  10mL OMNIPAQUE IOHEXOL 350 MG/ML SOLN  COMPARISON:  11/21/2013 chest radiograph  FINDINGS: Breathing motion artifact obscures the  lung bases.  Subject to this mild limitation, no filling defect is identified in the pulmonary arterial tree to suggest pulmonary embolus.  Thoracic aortic atherosclerotic calcification noted with some mural thrombus along the descending thoracic aorta. There is likely some degree of coronary atherosclerosis. Mildly enlarged cardiopericardial silhouette. Interventricular septal thickness 1.5 cm, borderline for left ventricular hypertrophy.  Cystic lesion in the left breast 2.8 by 3.5 cm, image 46 series 4. Adjacent pleural thickening and subpleural interstitial accentuation suggesting radiation therapy in the region.  Subacute left anterior third and fourth rib fractures could be posttraumatic or due to the localized radiation therapy causing weakening in the bone. Mild adjacent pleural thickening noted.  Thoracic spondylosis. Slight superior endplate concavities at T2 and T3 are age indeterminate.  Mild mosaic attenuation in the lungs with equal visibility of vessels in the darker  regions. Bilateral airway thickening in the lower lobes.  Review of the MIP images confirms the above findings.  IMPRESSION: 1. No pulmonary embolus is identified. 2. Subacute left anterior third and fourth rib fractures. This is in the vicinity of localized radiation therapy, which may have predispose to fracture. Mild adjacent pleural thickening. 3. Cystic lesion in the left breast 2.8 by 3.5 cm, probably post procedural. 4. Mild superior endplate concavities at T2 and T3, age indeterminate, likely subtle superior endplate compression fractures. 5. Mosaic attenuation in the lungs may be from low-grade alveolar infiltration from edema, or air trapping. There is notable airway thickening in both lower lobes. 6. Atherosclerosis.   Electronically Signed   By: Sherryl Barters M.D.   On: 12/04/2013 15:56   Mr Brain Wo Contrast  12/21/2013   CLINICAL DATA:  Episode of speech disturbance and right-sided weakness.  EXAM: MRI HEAD WITHOUT CONTRAST  TECHNIQUE: Multiplanar, multiecho pulse sequences of the brain and surrounding structures were obtained without intravenous contrast.  COMPARISON:  Head CT same day.  MRI 04/12/2012.  FINDINGS: Diffusion imaging does not show any acute or subacute infarction. The brainstem is normal. There are a few old small vessel cerebellar infarctions. There are a few old small vessel infarctions affecting the thalami basal ganglia. There are moderate chronic small-vessel ischemic changes throughout the deep and subcortical white matter. No cortical or large vessel territory infarction. No mass lesion, hemorrhage, hydrocephalus or extra-axial collection. There is arachnoid herniation into the sella but no pituitary mass. No inflammatory sinus disease. No skull or skullbase lesion.  IMPRESSION: No acute or subacute infarction. Moderate chronic small-vessel ischemic changes throughout the brain as outlined above.   Electronically Signed   By: Nelson Chimes M.D.   On: 12/21/2013 18:44   Dg  Chest Port 1 View  12/21/2013   CLINICAL DATA:  Status post intubation.  EXAM: PORTABLE CHEST - 1 VIEW  COMPARISON:  Single view of the chest 12/21/2013 at 1922 hr.  FINDINGS: The patient has a new endotracheal tube in place with the tip in good position at the level of the clavicular heads. NG tube is also in good position with the tip in the stomach. The chest is better expanded with decreased atelectasis. Patchy bilateral airspace disease likely due to edema again seen. Cardiomegaly.  IMPRESSION: ET tube and NG tube project in good position.  Decreased atelectasis with pulmonary edema again seen.   Electronically Signed   By: Inge Rise M.D.   On: 12/21/2013 21:49   Dg Chest Port 1 View  12/21/2013   CLINICAL DATA:  Acute hypoxia.  EXAM: PORTABLE CHEST - 1 VIEW  COMPARISON:  PA and lateral chest earlier this same day.  FINDINGS: Since the earlier study, extensive bilateral airspace disease has developed. Heart size is enlarged. There is small left pleural effusion. No pneumothorax.  IMPRESSION: New, extensive bilateral airspace disease is likely due to pulmonary edema.   Electronically Signed   By: Inge Rise M.D.   On: 12/21/2013 19:59   Dg Chest Port 1v Same Day  12/21/2013   CLINICAL DATA:  Status post repositioning of endotracheal tube.  EXAM: PORTABLE CHEST - 1 VIEW SAME DAY  COMPARISON:  Single view of the chest 12/21/2013 at 2133 hr  FINDINGS: ET tube is in place with the tip in good position at the inferior aspect of the clavicular heads. NG tube tip is in the stomach. Patchy bilateral airspace disease and cardiomegaly appear unchanged.  IMPRESSION: ET tube projects in good position.  No change in pulmonary edema.   Electronically Signed   By: Inge Rise M.D.   On: 12/21/2013 22:13   Dg Swallowing Func-speech Pathology  12/27/2013   Eden Emms, Student-SLP     12/27/2013 11:34 AM Objective Swallowing Evaluation: Modified Barium Swallowing Study   Patient Details   Name: Victoria Lindsey MRN: [id number] Date of Birth: May 18, 1925  Today's Date: 12/27/2013 Time: 3491-7915 SLP Time Calculation (min): 22 min  Past Medical History:  Past Medical History  Diagnosis Date  . Hypertension   . Parkinson's disease   . Hyperlipidemia   . Dizziness   . Orthostatic hypotension   . Anemia   . Tremor   . Breast cancer     LEFT BREAST  . Kidney failure     stage 3  . Hypothyroidism   . Prediabetes    Past Surgical History:  Past Surgical History  Procedure Laterality Date  . Cardiovascular stress test  06/2007    NORMAL  . Breast lumpectomy    . Cholecystectomy    . Tonsillectomy    . Nasal sinus surgery      submucous resection late 1950s   HPI:  12 F with Parkinson's Disease, CKD, HTN, pre-DM who presented to  [facility] on 10/16 with likely TIA difficulty in getting words out and  right arm felt funny, lasting about one hour and resolved.  Subsequently developed severe hypoxic respiratory failure almost  certainly 2/2 aspiration during MRI. Intubated from 10/16 to  10/17. MRI showed no acute finding. Pt reproted in chart to have  a history of dysphagia and drinks nectar thick liquids at home  but no notes from SLP are available.      Assessment / Plan / Recommendation Clinical Impression  Dysphagia Diagnosis: Mild pharyngeal phase dysphagia Clinical impression: Pt demonstrates a mild pharyngeal dysphagia  with normal age related swallow changes. Mild penetration of thin  liquids is consistent and likely due to incomplete laryngeal  closure during the swallow related to reduced base of tongue  retraction and epiglottic deflection. SLP provided max verbal  cues for chin tuck which is effective in preventing aspiration,  but requires full supervision and maximal cueing. Penetrate is  also cleared with a throat clear and second swallow. Mild residue  is retained in the valleculae with solids and liquids, also  cleared with a second swallow. Independent consumption of nectar  thick liquids is also  effective in preventing aspiration.  Aspiration risk may be higher at times given report of  fluctuating respiratory status from daughter. Given stable  respiratory function during exam, favor consumption of thin  liquids into long term. However, family may choose more  conservative diet to avoid related risks. Will discuss with pt's  daughter.    Treatment Recommendation  Therapy as outlined in treatment plan below    Diet Recommendation Regular;Thin liquid   Liquid Administration via: Straw Medication Administration: Whole meds with liquid Supervision: Full supervision/cueing for compensatory strategies Compensations: Slow rate;Small sips/bites;Clear throat after each  swallow Postural Changes and/or Swallow Maneuvers: Seated upright 90  degrees    Other  Recommendations Oral Care Recommendations: Oral care BID   Follow Up Recommendations  Inpatient Rehab    Frequency and Duration min 2x/week  2 weeks   Pertinent Vitals/Pain none    SLP Swallow Goals     General Date of Onset: 12/21/13 HPI: 44 F with Parkinson's Disease, CKD, HTN, pre-DM who  presented to [facility] on 10/16 with likely TIA difficulty in getting  words out and right arm felt funny, lasting about one hour and  resolved. Subsequently developed severe hypoxic respiratory  failure almost certainly 2/2 aspiration during MRI. Intubated  from 10/16 to 10/17. MRI showed no acute finding. Pt reproted in  chart to have a history of dysphagia and drinks nectar thick  liquids at home but no notes from SLP are available.  Type of Study: Modified Barium Swallowing Study Reason for Referral: Objectively evaluate swallowing function Previous Swallow Assessment: MBS at [facility] in  September - regular textures, nectar thick liquids with a chin  tuck and a straw Diet Prior to this Study: Dysphagia 3 (soft);Nectar-thick liquids Temperature Spikes Noted: No Respiratory Status: Room air History of Recent Intubation: Yes Length of Intubations (days): 2 days Date  extubated: 12/22/13 Behavior/Cognition: Alert;Cooperative;Requires cueing Oral Cavity - Dentition: Adequate natural dentition Oral Motor / Sensory Function: Within functional limits Self-Feeding Abilities: Able to feed self Patient Positioning: Upright in chair Baseline Vocal Quality: Clear Volitional Cough: Strong Volitional Swallow: Able to elicit Anatomy: Within functional limits    Reason for Referral Objectively evaluate swallowing function   Oral Phase Oral Preparation/Oral Phase Oral Phase: WFL   Pharyngeal Phase Pharyngeal Phase Pharyngeal Phase: Impaired Pharyngeal - Nectar Pharyngeal - Nectar Straw: Pharyngeal residue -  valleculae;Pharyngeal residue - pyriform sinuses Pharyngeal - Thin Pharyngeal - Thin Straw: Delayed swallow initiation;Premature  spillage to valleculae;Premature spillage to pyriform  sinuses;Penetration/Aspiration after swallow;Trace  aspiration;Compensatory strategies attempted (Comment) (chin  tuck) Penetration/Aspiration details (thin straw): Material enters  airway, CONTACTS cords and not ejected out Pharyngeal - Solids Pharyngeal - Puree: Pharyngeal residue - valleculae Pharyngeal - Regular: Pharyngeal residue - valleculae  Cervical Esophageal Phase    GO              Eden Emms 12/27/2013, 11:33 AM    US Abdomen Limited Ruq  12/24/2013   CLINICAL DATA:  80 year old female with history of high blood pressure, breast cancer, pre diabetes, post cholecystectomy now with elevated transaminases. Initial encounter.  EXAM: US ABDOMEN LIMITED - RIGHT UPPER QUADRANT  COMPARISON:  None.  FINDINGS: Gallbladder:  Surgically removed.  Common bile duct:  Diameter: 4.6 mm.  Liver:  Heterogeneous echotexture which may represent fatty infiltration without focal mass or intrahepatic biliary duct dilation.  IMPRESSION: Post cholecystectomy.  No evidence of common bile duct or intrahepatic biliary duct dilation.  Heterogeneous echotexture liver without focal mass. This may represent result  of fatty infiltration   Electronically Signed   By: Chauncey Cruel M.D.   On: 12/24/2013 15:55    ASSESSMENT/PLAN:  TIA - continue Plavix  and ASA Hypertension - uncontrolled; continue Metoprolol which was recently increased to 100 mg PO BID and Spironolactone 25 mg 1 tab PO Q D and increase Lasix to 40 mg PO QD; BP/HR Q shift X 1 week; check BMP Hyperlipidemia - continue Crestor 10 mg by mouth daily Parkinson's Disease - continue Rasagline 1 mg 1 tab by mouth daily and Rotigotine 8mg /24-hour one patch to skin daily Hypothyroidism - continue Synthroid 112 g by mouth daily Sjogren's syndrome - stable; continue sun H and 2.5 mg by mouth daily (for dry mouth)   I have filled out patient's discharge paperwork and written prescriptions.  Patient will receive home health PT, OT, ST, Nursing and CNA.  DME provided: 18" X 18" wheelchair with elevating leg rests and wheelchair cushion  Total discharge time: Greater than 30 minutes  Discharge time involved coordination of the discharge process with social worker, nursing staff and therapy department. Medical justification for home health services/DME verified.   CPT CODE: 37106    MEDINA-VARGAS,Gerda Yin, [facility] [phone]

## 2014-02-18 ENCOUNTER — Telehealth: Payer: Self-pay | Admitting: Neurology

## 2014-02-18 ENCOUNTER — Telehealth: Payer: Self-pay | Admitting: Cardiology

## 2014-02-18 NOTE — Telephone Encounter (Signed)
Patient's daughter questioning if patient should keep appointment with Dr. Erlinda Hong on 12/17.  Per daughter, Dr. Erlinda Hong wanted patient to wear heart monitor 30 days prior to coming in.  Patient has not been set up with HM as of today, will see Cardiologist on 12/17.  Please call and advise.

## 2014-02-18 NOTE — Telephone Encounter (Signed)
Spoke with patient's daughter, appointment was moved to Tuesday 02/19/14 at 3 pm, informed patient that Cardiac monitor is being worked on, patient's daughter has some concerns about heart monitor, and will discuss in visit with Dr Erlinda Hong. Waiting to hear from back from Dr Sherryl Barters office about her visit on 02/21/14

## 2014-02-18 NOTE — Telephone Encounter (Signed)
New Msg    Tashia from Dr. Erlinda Hong office calling, needs to know if pt is coming into office today to be set up for cardiac event monitor.  If not she needs to have this set up because Dr. Erlinda Hong made order for this back in September.    Please contact [phone] ext [phone].

## 2014-02-19 ENCOUNTER — Encounter: Payer: Self-pay | Admitting: *Deleted

## 2014-02-19 ENCOUNTER — Encounter: Payer: Self-pay | Admitting: Neurology

## 2014-02-19 ENCOUNTER — Other Ambulatory Visit: Payer: Self-pay | Admitting: Cardiology

## 2014-02-19 ENCOUNTER — Ambulatory Visit (INDEPENDENT_AMBULATORY_CARE_PROVIDER_SITE_OTHER): Payer: Medicare Other | Admitting: Neurology

## 2014-02-19 VITALS — BP 134/59 | HR 87 | Ht 61.0 in | Wt 116.4 lb

## 2014-02-19 DIAGNOSIS — G451 Carotid artery syndrome (hemispheric): Secondary | ICD-10-CM

## 2014-02-19 DIAGNOSIS — I10 Essential (primary) hypertension: Secondary | ICD-10-CM

## 2014-02-19 NOTE — Patient Instructions (Signed)
-   continue plavix and crestor for stroke prevention - continue BP medications for BP control - check BP at home - follow up with cardiologist for CHF and CAD and also cardiac event monitoring - Follow up with your primary care physician for stroke risk factor modification. Recommend maintain blood pressure goal <130/80, diabetes with hemoglobin A1c goal below 6.5% and lipids with LDL cholesterol goal below 70 mg/dL.  - follow in 3 months.

## 2014-02-20 ENCOUNTER — Encounter: Payer: Self-pay | Admitting: *Deleted

## 2014-02-20 NOTE — Progress Notes (Addendum)
STROKE NEUROLOGY FOLLOW UP NOTE  NAME: Victoria Lindsey DOB: 11-30-1925  REASON FOR VISIT: stroke follow up HISTORY FROM: pt and daughter and chart  Today we had the pleasure of seeing Victoria Lindsey in follow-up at our Neurology Clinic. Pt was accompanied by daughter.   History Summary Victoria Lindsey is an 80 y.o. female with history of TIA, Parkinson's disease, multiple drug allergies, HTN, HLD, hypothyroidism, Sjogren's syndrome, valvular abnormalities, chronic kidney disease (stage III), and chronic anemiawas admitted on 12/21/13 for CP and SOB. However, she stated that 2 days prior she had transient difficulty in getting words out and right arm/hand felt funny, lasting about one hour and resolved. Similar symptoms years ago. Her family took her BP and noted it was elevated (systolically in the 161'W) all day (but also notes she has been struggling with her BP for about one year and for the last several weeks, her BP was high). On 12/21/13, she also started to have SOB and CP, due to multiple medical issues she was brought to ED. In ER, SBP is at 200s, and pt is asymptomatic. MRI did not show acute infarct, considered TIA with cardioembolic source vs. Hypertensive encephalopathy. She was continued on ASA and plavix and recommended cardiac event monitoring on discharge.  Interval History During the interval time, the patient has been doing well. She was sent to SNF for 4 weeks after discharge. Followed with PCP for BP control and CHF treatment,  and currently BP in good control. Today in clinic 134/59. She feels better overall, but still not getting the cardiac event monitoring yet.     REVIEW OF SYSTEMS: Full 14 system review of systems performed and notable only for those listed below and in HPI above, all others are negative:  Constitutional: fatigue, weight loss  Cardiovascular: N/A  Ear/Nose/Throat: trouble swallowing  Skin: N/A  Eyes: N/A  Respiratory: SOB, snoring    Gastroitestinal: N/A  Genitourinary: N/A Hematology/Lymphatic: easy bruising  Endocrine: N/A  Musculoskeletal: N/A  Allergy/Immunology: running nose  Neurological: memory loss, numbness, weakness, dizziness, restless leg  Psychiatric: change in appetite, disinterest in activities  The following represents the patient's updated allergies and side effects list: Allergies  Allergen Reactions  . Aciphex [Rabeprazole Sodium]   . Amantadines   . Avapro [Irbesartan]   . Carbidopa-Levodopa   . Cardura [Doxazosin Mesylate]   . Ciprofloxacin   . Clonidine Derivatives   . Cozaar   . Diltiazem   . Famotidine   . Hctz [Hydrochlorothiazide]   . Indapamide   . Lansoprazole   . Lisinopril     Mouth problems  . Loratadine   . Maxidex [Dexamethasone]   . Mirapex [Pramipexole Dihydrochloride]   . Norvasc [Amlodipine Besylate]   . Pantoprazole Sodium   . Penicillins   . Prilosec [Omeprazole]   . Procardia [Nifedipine]   . Proton Pump Inhibitors   . Sulfa Drugs Cross Reactors   . Trimethoprim   . Zyrtec [Cetirizine Hcl]     The neurologically relevant items on the patient's problem list were reviewed on today's visit.  Neurologic Examination  A problem focused neurological exam (12 or more points of the single system neurologic examination, vital signs counts as 1 point, cranial nerves count for 8 points) was performed.  Blood pressure 134/59, pulse 87, height 5\' 1"  (1.549 m), weight 116 lb 6.4 oz (52.799 kg).  General - Well nourished, well developed, in no apparent distress.  Ophthalmologic - not able to see through due  to incooperation.  Cardiovascular - Regular rate and rhythm with no murmur.  Mental Status -  Level of arousal and orientation to time, place, and person were intact. Language including expression, naming, repetition, comprehension was assessed and found intact.  Cranial Nerves II - XII - II - Visual field intact OU. III, IV, VI - Extraocular movements  intact. V - Facial sensation intact bilaterally. VII - Facial movement intact bilaterally. VIII - Hearing & vestibular intact bilaterally. X - Palate elevates symmetrically. XI - Chin turning & shoulder shrug intact bilaterally. XII - Tongue protrusion intact.  Motor Strength - The patient's strength was normal in all extremities and pronator drift was absent.  Bulk was normal and fasciculations were absent.   Motor Tone - Muscle tone was assessed at the neck and appendages and was normal.  Reflexes - The patient's reflexes were normal in all extremities and she had no pathological reflexes.  Sensory - Light touch, temperature/pinprick were assessed and were normal.    Coordination - The patient had normal movements in the hands and feet with no ataxia or dysmetria.  Tremor was absent.  Gait and Station - slow cautious gait but steady.  Data reviewed: I personally reviewed the images and agree with the radiology interpretations.  2-D echo 10/25/2013 Left ventricle: The cavity size was normal. Wall thickness was increased in a pattern of mild LVH. Systolic function was hyperdynamic. The estimated ejection fraction was in the range of 75% to 80%. Wall motion was normal; there were no regional wall motion abnormalities. There was an increased relative contribution of atrial contraction to ventricular filling. Doppler parameters are consistent with abnormal left ventricular relaxation (grade 1 diastolic dysfunction). Aortic valve: There was mild regurgitation. Left atrium: The atrium was mildly dilated.  Dg Chest 2 View 12/21/2013  Chronic lung disease is stable. No superimposed acute abnormality.   Ct Head Wo Contrast 12/21/2013  Atrophy and chronic microvascular ischemia. No acute abnormality   Mr Brain Wo Contrast 12/21/2013  No acute or subacute infarction. Moderate chronic small-vessel ischemic changes throughout the brain as outlined above.   CUS -  Bilateral: 1-39% ICA stenosis. Vertebral artery flow is antegrade.   Component     Latest Ref Rng 12/21/2013 12/22/2013  Cholesterol     0 - 200 mg/dL  99  Triglycerides     <150 mg/dL  133  HDL     >39 mg/dL  41  Total CHOL/HDL Ratio       2.4  VLDL     0 - 40 mg/dL  27  LDL (calc)     0 - 99 mg/dL  31  Hgb A1c MFr Bld     <5.7 % 6.4 (H)   Mean Plasma Glucose     <117 mg/dL 137 (H)     Assessment: As you may recall, she is a 80 y.o. Caucasian female with PMH of TIA, Parkinson's disease, multiple drug allergies, HTN, HLD, hypothyroidism, Sjogren's syndrome, valvular abnormalities, chronic kidney disease (stage III), and chronic anemiawas admitted on 12/21/13 for CP and SOB. During the admission, she was found to have very high BPs and also was evaluated for an episode of expressive aphasia two days prior to the admission. Stroke work up negative and MRI no acute stroke. Due to her age and lack of stroke risk factors as well as presentation of aphasia, cardiac event monitoring as outpt was highly recommended to rule out afib. Hypertensive encephalopathy certainly still in DDx.   Plan:  -  continue ASA and plavix and crestor for stroke prevention - continue BP control and check BP at home - follow up with cardiology for BP control, CHF management - follow up with cardiology for the cardiac event monitoring - Follow up with your primary care physician for stroke risk factor modification. Recommend maintain blood pressure goal <130/80, diabetes with hemoglobin A1c goal below 6.5% and lipids with LDL cholesterol goal below 70 mg/dL.  - RTC in 3 months.  No orders of the defined types were placed in this encounter.    No orders of the defined types were placed in this encounter.    Patient Instructions  - continue plavix and crestor for stroke prevention - continue BP medications for BP control - check BP at home - follow up with cardiologist for CHF and CAD and also cardiac event  monitoring - Follow up with your primary care physician for stroke risk factor modification. Recommend maintain blood pressure goal <130/80, diabetes with hemoglobin A1c goal below 6.5% and lipids with LDL cholesterol goal below 70 mg/dL.  - follow in 3 months.   [name], MD PhD [facility] [address] [place],  [address] [phone]

## 2014-02-21 ENCOUNTER — Ambulatory Visit: Payer: Medicare Other | Admitting: Neurology

## 2014-02-21 ENCOUNTER — Encounter: Payer: Self-pay | Admitting: Cardiology

## 2014-02-21 ENCOUNTER — Ambulatory Visit (INDEPENDENT_AMBULATORY_CARE_PROVIDER_SITE_OTHER): Payer: Medicare Other | Admitting: Cardiology

## 2014-02-21 ENCOUNTER — Encounter (INDEPENDENT_AMBULATORY_CARE_PROVIDER_SITE_OTHER): Payer: Medicare Other

## 2014-02-21 ENCOUNTER — Encounter: Payer: Self-pay | Admitting: *Deleted

## 2014-02-21 VITALS — BP 112/56 | HR 81 | Ht 61.0 in | Wt 116.0 lb

## 2014-02-21 DIAGNOSIS — R42 Dizziness and giddiness: Secondary | ICD-10-CM | POA: Diagnosis not present

## 2014-02-21 DIAGNOSIS — I119 Hypertensive heart disease without heart failure: Secondary | ICD-10-CM | POA: Diagnosis not present

## 2014-02-21 DIAGNOSIS — I10 Essential (primary) hypertension: Secondary | ICD-10-CM | POA: Diagnosis not present

## 2014-02-21 DIAGNOSIS — R9431 Abnormal electrocardiogram [ECG] [EKG]: Secondary | ICD-10-CM

## 2014-02-21 DIAGNOSIS — R002 Palpitations: Secondary | ICD-10-CM

## 2014-02-21 DIAGNOSIS — G459 Transient cerebral ischemic attack, unspecified: Secondary | ICD-10-CM | POA: Diagnosis not present

## 2014-02-21 MED ORDER — FUROSEMIDE 20 MG PO TABS: 20.0000 mg | ORAL_TABLET | Freq: Every day | ORAL | Status: DC

## 2014-02-21 NOTE — Progress Notes (Signed)
Victoria Lindsey Date of Birth:  05-18-1925 [place] [facility] [address] [place], [place]  [address] [phone]        Fax   [phone]   History of Present Illness: This pleasant 80 year old woman is seen for a follow-up  office visit. The patient was hospitalized from October 16 through 12/27/2013.  She was placed in the hospital because of initial concern about possible TIA.  She was also having markedly labile blood pressure readings.  She was seen in mid August 2015 because of recent elevation of her blood pressure.  She has a history of essential hypertension. She has a past history of a TIAs and is on aspirin and Plavix. She has a history of Parkinson's disease and a history of Sjogren's syndrome. She does not have any known ischemic heart disease and she had a normal nuclear stress test in 2009.  Marland Kitchen  There was mild aortic insufficiency and mild mitral regurgitation.  Her last chest x-ray in March 2014 showed borderline cardiomegaly She has stage III chronic kidney disease. She also had problems with her urinary bladder. Since last visit her neurologist Dr. Baltazar Lindsey in [facility] has switched her to a new patch for Parkinson's disease. Since last visit she has had no new cardiovascular symptoms.  She fell in July and suffered a compression fracture of her spine.  Over the past month she has had problems with recurrent urinary infections.  She is now taking Keflex 250 mg each night for the next 90 days.  Victoria Lindsey has been coming out to the house and checking her blood pressures.  Her last echocardiogram was 10/25/13 and showed a hyperdynamic left ventricle with ejection fraction 75-80%.  Since that at no she has been on a higher dose of beta blocker and has felt better.the echocardiogram also showed diastolic dysfunction. While hospitalized in October 2015 the patient developed acute respiratory distress while she was in the MRI machine.  There was a question as to whether she  might have aspirated.  She was intubated and was on a ventilator for 14 hours.  She improved quickly.  Chest x-ray showed marked bilateral densities raising question of heart failure versus aspiration pneumonia. Since discharge she has been followed up by neurology.  Neurology has appropriately requested a a vent monitor to rule out arrhythmia as a cause of her dizziness and previous TIA.  Current Outpatient Prescriptions  Medication Sig Dispense Refill  . acetaminophen (TYLENOL) 500 MG tablet Take 500 mg by mouth every 6 (six) hours as needed (pain).    Marland Kitchen antiseptic oral rinse (BIOTENE) LIQD 15 mLs by Mouth Rinse route as needed for dry mouth.    Marland Kitchen aspirin 81 MG tablet Take 81 mg by mouth every evening.     Marland Kitchen BIOTIN PO Take 1,000 mcg by mouth daily.     . cephALEXin (KEFLEX) 250 MG capsule Take 250 mg by mouth at bedtime. Take 1 at bedtime.    . Cholecalciferol (VITAMIN D3) 2000 UNITS TABS Take 1 tablet by mouth daily.     . clopidogrel (PLAVIX) 75 MG tablet Take 75 mg by mouth every evening.    . Cranberry 500 MG CAPS Take 500 mg by mouth daily.     Marland Kitchen docusate sodium (COLACE) 100 MG capsule Take 100 mg by mouth daily.    Marland Kitchen FOLIC ACID PO Take 354 mcg by mouth daily.     . furosemide (LASIX) 20 MG tablet Take 1 tablet (20  mg total) by mouth daily. 30 tablet 5  . metoprolol (LOPRESSOR) 100 MG tablet Take 100 mg by mouth daily.    . Multiple Vitamin (MULTIVITAMIN WITH MINERALS) TABS tablet Take 1 tablet by mouth daily.    . ONE TOUCH ULTRA TEST test strip   1  . pilocarpine (SALAGEN) 5 MG tablet Take 5 mg by mouth daily.  0  . Probiotic Product (ALIGN PO) Take 1 capsule by mouth every evening.     . rasagiline (AZILECT) 1 MG TABS Take 1 mg by mouth daily.    . ReliOn Ultra Thin Lancets MISC by Does not apply route.    . rosuvastatin (CRESTOR) 10 MG tablet Take 10 mg by mouth every evening.     . Rotigotine 8 MG/24HR PT24 Place 1 patch onto the skin daily.     Marland Kitchen spironolactone (ALDACTONE) 25 MG  tablet Take 1 tablet (25 mg total) by mouth daily. 45 tablet 3  . SYNTHROID 112 MCG tablet Take 112 mcg by mouth daily.  0   No current facility-administered medications for this visit.    Allergies  Allergen Reactions  . Aciphex [Rabeprazole Sodium]   . Amantadines   . Avapro [Irbesartan]   . Carbidopa-Levodopa   . Cardura [Doxazosin Mesylate]   . Ciprofloxacin   . Clonidine Derivatives   . Cozaar   . Diltiazem   . Famotidine   . Hctz [Hydrochlorothiazide]   . Indapamide   . Lansoprazole   . Lisinopril     Mouth problems  . Loratadine   . Maxidex [Dexamethasone]   . Mirapex [Pramipexole Dihydrochloride]   . Norvasc [Amlodipine Besylate]   . Pantoprazole Sodium   . Penicillins   . Prilosec [Omeprazole]   . Procardia [Nifedipine]   . Proton Pump Inhibitors   . Sulfa Drugs Cross Reactors   . Trimethoprim   . Zyrtec [Cetirizine Hcl]     Patient Active Problem List   Diagnosis Date Noted  . Dizziness of unknown cause 02/21/2014  . Left ventricular diastolic dysfunction with preserved systolic function 79/89/2119  . Essential hypertension 12/30/2013  . Transaminitis 12/24/2013  . Palpitations 12/23/2013  . Respiratory insufficiency 12/22/2013  . Normocytic anemia 12/21/2013  . Thrombocytopenia 12/21/2013  . Abnormal ECG 12/15/2013  . Abnormal involuntary movement 12/15/2013  . Colorectal polyps 12/15/2013  . Chest pain 12/15/2013  . Arteriosclerosis of coronary artery 12/15/2013  . Chronic kidney disease (CKD), stage III (moderate) 12/15/2013  . Corn 12/15/2013  . Diabetes 12/15/2013  . Breathing difficult 12/15/2013  . Acid reflux 12/15/2013  . HLD (hyperlipidemia) 12/15/2013  . BP (high blood pressure) 12/15/2013  . FOM (frequency of micturition) 12/15/2013  . Breast cancer, female 12/15/2013  . Fungal infection of nail 12/15/2013  . Osteopenia 12/15/2013  . Raynaud's syndrome 12/15/2013  . Restless leg 12/15/2013  . Gougerout-Sjoegren syndrome 12/15/2013   . Change in blood platelet count 12/15/2013  . Infection of urinary tract 12/15/2013  . Lower urinary tract infection 10/12/2013  . Idiopathic Parkinson's disease 09/06/2013  . Bladder spasm 09/05/2013  . Urgency of micturation 09/05/2013  . Dyspnea 06/15/2012  . Hyperglycemia 12/27/2011  . Sjogren's syndrome 12/17/2010  . TIA (transient ischemic attack) 12/17/2010  . Benign hypertensive heart disease without heart failure 09/01/2010  . Parkinson's disease 09/01/2010  . Hypothyroidism 09/01/2010  . Hypercholesterolemia 09/01/2010  . Chronic anemia 09/01/2010    History  Smoking status  . Former Smoker -- 0.50 packs/day for 12 years  . Types: Cigarettes  .  Quit date: 03/08/1964  Smokeless tobacco  . Never Used    History  Alcohol Use No    Family History  Problem Relation Age of Onset  . Heart disease Mother   . Heart failure Father   . Parkinsonism Father     Review of Systems: Constitutional: no fever chills diaphoresis or fatigue or change in weight.  Head and neck: no hearing loss, no epistaxis, no photophobia or visual disturbance. Respiratory: No cough, shortness of breath or wheezing. Cardiovascular: No chest pain peripheral edema, palpitations. Gastrointestinal: No abdominal distention, no abdominal pain, no change in bowel habits hematochezia or melena. Genitourinary: No dysuria, no frequency, no urgency, no nocturia. Musculoskeletal:No arthralgias, no back pain, no gait disturbance or myalgias. Neurological: No dizziness, no headaches, no numbness, no seizures, no syncope, no weakness, no tremors. Hematologic: No lymphadenopathy, no easy bruising. Psychiatric: No confusion, no hallucinations, no sleep disturbance.    Physical Exam: Filed Vitals:   02/21/14 1441  BP: 112/56  Pulse: 81   general appearance reveals a well-developed well-nourished elderly woman in no distress.  The head and neck exam reveals pupils equal and reactive.  Extraocular  movements are full.  There is no scleral icterus.  The mouth and pharynx are normal.  The neck is supple.  The carotids reveal no bruits.  The jugular venous pressure is normal.  The  thyroid is not enlarged.  There is no lymphadenopathy.  The chest reveals bilateral inspiratory rales at the bases. Expansion of the chest is symmetrical.  The precordium is quiet.  The first heart sound is normal.  The second heart sound is physiologically split.  There is no murmur gallop rub or click.  There is no abnormal lift or heave.  The abdomen is soft and nontender.  The bowel sounds are normal.  The liver and spleen are not enlarged.  There are no abdominal masses.  There are no abdominal bruits.  Extremities reveal good pedal pulses.  There is moderate pretibial edema.  There is no cyanosis or clubbing.  Strength is normal and symmetrical in all extremities.  There is no lateralizing weakness.  There are no sensory deficits.  The skin is warm and dry.  There is no rash.   EKG shows normal sinus rhythm with nonspecific ST-T wave abnormalities unchanged since 01/09/14.  There is prolonged QTc interval  Assessment / Plan: 1.  Essential hypertension with hyperdynamic left ventricular ejection fraction 75-80% prior to increasing beta blocker dose. 2. Dyspnea 3. past history of TIAs 4. Parkinsonism 5. recent compression fracture from a fall in July 20 15th 6. chronic kidney disease stage III 7. Left ventricular diastolic dysfunction by echo 8.  Dizzy spells  Plan: The patient's blood pressure is running low now.  We will reduce her Lasix back down to 20 mg daily.  We will place a 4 week event monitor to look for underlying arrhythmia as a cause of her dizzy spells and her TIA.  Check in 3 months for follow-up office visit EKG and basal metabolic panel.

## 2014-02-21 NOTE — Patient Instructions (Signed)
Your physician has recommended you make the following change in your medication:  1) DECREASE Furosemide to 20 mg DAILY  Your physician has recommended that you wear an event monitor. Event monitors are medical devices that record the heart's electrical activity. Doctors most often [place] these monitors to diagnose arrhythmias. Arrhythmias are problems with the speed or rhythm of the heartbeat. The monitor is a small, portable device. You can wear one while you do your normal daily activities. This is usually used to diagnose what is causing palpitations/syncope (passing out).  Your physician recommends that you schedule a follow-up appointment in: 3 Months with Dr. Tonia Brooms

## 2014-02-21 NOTE — Progress Notes (Signed)
Patient ID: Victoria Lindsey, female   DOB: [birth year], 80 y.o.   MRN: [id number] [employer] 30 day cardiac event monitor applied to patient.

## 2014-02-21 NOTE — Telephone Encounter (Signed)
Monitor placed at office visit today

## 2014-02-28 ENCOUNTER — Emergency Department (HOSPITAL_BASED_OUTPATIENT_CLINIC_OR_DEPARTMENT_OTHER): Payer: Medicare Other

## 2014-02-28 ENCOUNTER — Encounter (HOSPITAL_BASED_OUTPATIENT_CLINIC_OR_DEPARTMENT_OTHER): Payer: Self-pay | Admitting: Emergency Medicine

## 2014-02-28 DIAGNOSIS — Z87891 Personal history of nicotine dependence: Secondary | ICD-10-CM | POA: Insufficient documentation

## 2014-02-28 DIAGNOSIS — E785 Hyperlipidemia, unspecified: Secondary | ICD-10-CM | POA: Insufficient documentation

## 2014-02-28 DIAGNOSIS — W01198A Fall on same level from slipping, tripping and stumbling with subsequent striking against other object, initial encounter: Secondary | ICD-10-CM | POA: Diagnosis not present

## 2014-02-28 DIAGNOSIS — S0003XA Contusion of scalp, initial encounter: Secondary | ICD-10-CM | POA: Diagnosis not present

## 2014-02-28 DIAGNOSIS — Z7982 Long term (current) use of aspirin: Secondary | ICD-10-CM | POA: Insufficient documentation

## 2014-02-28 DIAGNOSIS — Z79899 Other long term (current) drug therapy: Secondary | ICD-10-CM | POA: Diagnosis not present

## 2014-02-28 DIAGNOSIS — Z880 Allergy status to penicillin: Secondary | ICD-10-CM | POA: Insufficient documentation

## 2014-02-28 DIAGNOSIS — G20 Parkinson's disease: Secondary | ICD-10-CM | POA: Diagnosis not present

## 2014-02-28 DIAGNOSIS — S300XXA Contusion of lower back and pelvis, initial encounter: Secondary | ICD-10-CM | POA: Insufficient documentation

## 2014-02-28 DIAGNOSIS — N183 Chronic kidney disease, stage 3 (moderate): Secondary | ICD-10-CM | POA: Insufficient documentation

## 2014-02-28 DIAGNOSIS — I129 Hypertensive chronic kidney disease with stage 1 through stage 4 chronic kidney disease, or unspecified chronic kidney disease: Secondary | ICD-10-CM | POA: Diagnosis not present

## 2014-02-28 DIAGNOSIS — Y998 Other external cause status: Secondary | ICD-10-CM | POA: Insufficient documentation

## 2014-02-28 DIAGNOSIS — E039 Hypothyroidism, unspecified: Secondary | ICD-10-CM | POA: Diagnosis not present

## 2014-02-28 DIAGNOSIS — S0990XA Unspecified injury of head, initial encounter: Secondary | ICD-10-CM | POA: Diagnosis present

## 2014-02-28 DIAGNOSIS — Y9301 Activity, walking, marching and hiking: Secondary | ICD-10-CM | POA: Insufficient documentation

## 2014-02-28 DIAGNOSIS — Z853 Personal history of malignant neoplasm of breast: Secondary | ICD-10-CM | POA: Insufficient documentation

## 2014-02-28 DIAGNOSIS — Y92019 Unspecified place in single-family (private) house as the place of occurrence of the external cause: Secondary | ICD-10-CM | POA: Diagnosis not present

## 2014-02-28 NOTE — ED Notes (Signed)
Pt fell backward onto garage floor, hitting back of head.  Area of swelling on back of head.  Skin intact.  EMS responded and recommended they bring her to the Ed for CT due to pt taking Plavix.

## 2014-03-01 ENCOUNTER — Emergency Department (HOSPITAL_BASED_OUTPATIENT_CLINIC_OR_DEPARTMENT_OTHER)
Admission: EM | Admit: 2014-03-01 | Discharge: 2014-03-01 | Disposition: A | Discharge status: Home / Self Care | Payer: Medicare Other | Attending: Emergency Medicine | Admitting: Emergency Medicine

## 2014-03-01 ENCOUNTER — Emergency Department (HOSPITAL_BASED_OUTPATIENT_CLINIC_OR_DEPARTMENT_OTHER): Payer: Medicare Other

## 2014-03-01 DIAGNOSIS — S0003XA Contusion of scalp, initial encounter: Secondary | ICD-10-CM

## 2014-03-01 DIAGNOSIS — S300XXA Contusion of lower back and pelvis, initial encounter: Secondary | ICD-10-CM

## 2014-03-01 DIAGNOSIS — Y92009 Unspecified place in unspecified non-institutional (private) residence as the place of occurrence of the external cause: Secondary | ICD-10-CM

## 2014-03-01 DIAGNOSIS — W19XXXA Unspecified fall, initial encounter: Secondary | ICD-10-CM

## 2014-03-01 NOTE — ED Provider Notes (Signed)
CSN: [id number]     Arrival date & time 02/28/14  2257 History   First MD Initiated Contact with Patient 03/01/14 0216     Chief Complaint  Patient presents with  . Fall     (Consider location/radiation/quality/duration/timing/severity/associated sxs/prior Treatment) Patient is a 80 y.o. female presenting with fall. The history is provided by the patient.  Fall  She fell at home hitting her head on the garage floor. She also hit her buttocks on the floor. She was using her walker and lost her balance and fell backward. She is unable to put a number on pain but pain is mild to moderate area there is no loss of consciousness and no vomiting. She is taking clopidogrel.  Past Medical History  Diagnosis Date  . Hypertension   . Parkinson's disease   . Hyperlipidemia   . Dizziness   . Orthostatic hypotension   . Anemia   . Tremor   . Breast cancer     LEFT BREAST  . Kidney failure     stage 3  . Hypothyroidism   . Prediabetes    Past Surgical History  Procedure Laterality Date  . Cardiovascular stress test  06/2007    NORMAL  . Breast lumpectomy    . Cholecystectomy    . Tonsillectomy    . Nasal sinus surgery      submucous resection late 1950s   Family History  Problem Relation Age of Onset  . Heart disease Mother   . Heart failure Father   . Parkinsonism Father    History  Substance Use Topics  . Smoking status: Former Smoker -- 0.50 packs/day for 12 years    Types: Cigarettes    Quit date: 03/08/1964  . Smokeless tobacco: Never Used  . Alcohol Use: No   OB History    No data available     Review of Systems  All other systems reviewed and are negative.     Allergies  Aciphex; Amantadines; Avapro; Carbidopa-levodopa; Cardura; Ciprofloxacin; Clonidine derivatives; Cozaar; Diltiazem; Famotidine; Hctz; Indapamide; Lansoprazole; Lisinopril; Loratadine; Maxidex; Mirapex; Norvasc; Pantoprazole sodium; Penicillins; Prilosec; Procardia; Proton pump inhibitors;  Sulfa drugs cross reactors; Trimethoprim; and Zyrtec  Home Medications   Prior to Admission medications   Medication Sig Start Date End Date Taking? Authorizing Provider  acetaminophen (TYLENOL) 500 MG tablet Take 500 mg by mouth every 6 (six) hours as needed (pain).    Historical Provider, MD  antiseptic oral rinse (BIOTENE) LIQD 15 mLs by Mouth Rinse route as needed for dry mouth.    Historical Provider, MD  aspirin 81 MG tablet Take 81 mg by mouth every evening.     Historical Provider, MD  BIOTIN PO Take 1,000 mcg by mouth daily.     Historical Provider, MD  cephALEXin (KEFLEX) 250 MG capsule Take 250 mg by mouth at bedtime. Take 1 at bedtime. 11/05/13   Historical Provider, MD  Cholecalciferol (VITAMIN D3) 2000 UNITS TABS Take 1 tablet by mouth daily.     Historical Provider, MD  clopidogrel (PLAVIX) 75 MG tablet Take 75 mg by mouth every evening.    Historical Provider, MD  Cranberry 500 MG CAPS Take 500 mg by mouth daily.     Historical Provider, MD  docusate sodium (COLACE) 100 MG capsule Take 100 mg by mouth daily.    Historical Provider, MD  FOLIC ACID PO Take 295 mcg by mouth daily.     Historical Provider, MD  furosemide (LASIX) 20 MG tablet Take 1 tablet (  20 mg total) by mouth daily. 02/21/14   [name], MD  metoprolol (LOPRESSOR) 100 MG tablet Take 100 mg by mouth daily. 02/05/14   Historical Provider, MD  Multiple Vitamin (MULTIVITAMIN WITH MINERALS) TABS tablet Take 1 tablet by mouth daily.    Historical Provider, MD  ONE TOUCH ULTRA TEST test strip  02/04/14   Historical Provider, MD  pilocarpine (SALAGEN) 5 MG tablet Take 5 mg by mouth daily. 01/25/14   Historical Provider, MD  Probiotic Product (ALIGN PO) Take 1 capsule by mouth every evening.     Historical Provider, MD  rasagiline (AZILECT) 1 MG TABS Take 1 mg by mouth daily.    Historical Provider, MD  ReliOn Ultra Thin Lancets MISC by Does not apply route.    Historical Provider, MD  rosuvastatin (CRESTOR) 10 MG  tablet Take 10 mg by mouth every evening.  05/24/11   [name], MD  Rotigotine 8 MG/24HR PT24 Place 1 patch onto the skin daily.     Historical Provider, MD  spironolactone (ALDACTONE) 25 MG tablet Take 1 tablet (25 mg total) by mouth daily. 12/27/13   Lucious Groves, DO  SYNTHROID 112 MCG tablet Take 112 mcg by mouth daily. 01/25/14   Historical Provider, MD   BP 154/61 mmHg  Pulse 82  Temp(Src) 97.7 F (36.5 C) (Oral)  Resp 20  Ht 5\' 1"  (1.549 m)  Wt 117 lb (53.071 kg)  BMI 22.12 kg/m2  SpO2 93% Physical Exam  Nursing note and vitals reviewed.  80 year old female, resting comfortably and in no acute distress. Vital signs are significant for hypertension. Oxygen saturation is 93%, which is normal. Head is normocephalic. Scalp hematomas present on the occiput. PERRLA, EOMI. Oropharynx is clear. Neck is nontende without adenopathy or JVD. Back is nontender and there is no CVA tenderness. Lungs are clear without rales, wheezes, or rhonchi. Chest is nontender. Heart has regular rate and rhythm without murmur. Abdomen is soft, flat, nontender without masses or hepatosplenomegaly and peristalsis is normoactive. Extremities have no cyanosis or edema, full range of motion is present. There is tenderness to palpation in the left sacral area. Skin is warm and dry without rash. Neurologic: Mental status is normal, cranial nerves are intact, there are no motor or sensory deficits.  ED Course  Procedures (including critical care time)  Imaging Review Ct Head Wo Contrast  03/01/2014   CLINICAL DATA:  Fall, occipital head trauma, swelling  EXAM: CT HEAD WITHOUT CONTRAST  CT CERVICAL SPINE WITHOUT CONTRAST  TECHNIQUE: Multidetector CT imaging of the head and cervical spine was performed following the standard protocol without intravenous contrast. Multiplanar CT image reconstructions of the cervical spine were also generated.  COMPARISON:  12/21/2013  FINDINGS: CT HEAD FINDINGS  Stable  degree of cortical volume loss with proportional ventricular prominence. No acute hemorrhage, infarct, or mass lesion is identified. Left parieto-occipital scalp swelling. No underlying skull fracture.  CT CERVICAL SPINE FINDINGS  Left sphenoid sinus mucous retention cyst or polyp. Straightening of the normal cervical lordosis is noted which may be positional. Mild mid cervical degenerative changes noted most prominent at C5-C6. Multilevel facet osteoarthritic change is noted. No fracture or dislocation.  IMPRESSION: Left parieto-occipital scalp swelling without underlying acute intracranial finding.  No evidence for cervical spine fracture or dislocation.   Electronically Signed   By: Conchita Paris M.D.   On: 03/01/2014 01:03   Ct Cervical Spine Wo Contrast  03/01/2014   CLINICAL DATA:  Fall, occipital head  trauma, swelling  EXAM: CT HEAD WITHOUT CONTRAST  CT CERVICAL SPINE WITHOUT CONTRAST  TECHNIQUE: Multidetector CT imaging of the head and cervical spine was performed following the standard protocol without intravenous contrast. Multiplanar CT image reconstructions of the cervical spine were also generated.  COMPARISON:  12/21/2013  FINDINGS: CT HEAD FINDINGS  Stable degree of cortical volume loss with proportional ventricular prominence. No acute hemorrhage, infarct, or mass lesion is identified. Left parieto-occipital scalp swelling. No underlying skull fracture.  CT CERVICAL SPINE FINDINGS  Left sphenoid sinus mucous retention cyst or polyp. Straightening of the normal cervical lordosis is noted which may be positional. Mild mid cervical degenerative changes noted most prominent at C5-C6. Multilevel facet osteoarthritic change is noted. No fracture or dislocation.  IMPRESSION: Left parieto-occipital scalp swelling without underlying acute intracranial finding.  No evidence for cervical spine fracture or dislocation.   Electronically Signed   By: Conchita Paris M.D.   On: 03/01/2014 01:03    MDM    Final diagnoses:  Fall at home, initial encounter  Contusion of occipital region of scalp, initial encounter  Contusion of sacrum, initial encounter    Fall at home secondary to balance problems. Head CT is negative as is CT of cervical spine. She is being sent for x-rays of sacrum and coccyx and pelvis.  X-rays show no evidence of fracture. She is discharged with instructions to apply ice to swollen and tender areas and take acetaminophen for pain. Patient family advised of risk of delayed bleeding because of clopidogrel and she is to return if any new neurologic symptoms appear.  Delora Fuel, MD 77/03/40 3524

## 2014-03-01 NOTE — Discharge Instructions (Signed)
Take acetaminophen as needed for pain.   Because you are taking clopidogrel, you are at risk for delayed bleeding. Return to the emergency department if there are any new neurologic findings such as trouble talking, weakness, numbness, altered level of consciousness.  Fall Prevention and Home Safety Falls cause injuries and can affect all age groups. It is possible to use preventive measures to significantly decrease the likelihood of falls. There are many simple measures which can make your home safer and prevent falls. OUTDOORS  Repair cracks and edges of walkways and driveways.  Remove high doorway thresholds.  Trim shrubbery on the main path into your home.  Have good outside lighting.  Clear walkways of tools, rocks, debris, and clutter.  Check that handrails are not broken and are securely fastened. Both sides of steps should have handrails.  Have leaves, snow, and ice cleared regularly.  Use sand or salt on walkways during winter months.  In the garage, clean up grease or oil spills. BATHROOM  Install night lights.  Install grab bars by the toilet and in the tub and shower.  Use non-skid mats or decals in the tub or shower.  Place a plastic non-slip stool in the shower to sit on, if needed.  Keep floors dry and clean up all water on the floor immediately.  Remove soap buildup in the tub or shower on a regular basis.  Secure bath mats with non-slip, double-sided rug tape.  Remove throw rugs and tripping hazards from the floors. BEDROOMS  Install night lights.  Make sure a bedside light is easy to reach.  Do not use oversized bedding.  Keep a telephone by your bedside.  Have a firm chair with side arms to use for getting dressed.  Remove throw rugs and tripping hazards from the floor. KITCHEN  Keep handles on pots and pans turned toward the center of the stove. Use back burners when possible.  Clean up spills quickly and allow time for drying.  Avoid  walking on wet floors.  Avoid hot utensils and knives.  Position shelves so they are not too high or low.  Place commonly used objects within easy reach.  If necessary, use a sturdy step stool with a grab bar when reaching.  Keep electrical cables out of the way.  Do not use floor polish or wax that makes floors slippery. If you must use wax, use non-skid floor wax.  Remove throw rugs and tripping hazards from the floor. STAIRWAYS  Never leave objects on stairs.  Place handrails on both sides of stairways and use them. Fix any loose handrails. Make sure handrails on both sides of the stairways are as long as the stairs.  Check carpeting to make sure it is firmly attached along stairs. Make repairs to worn or loose carpet promptly.  Avoid placing throw rugs at the top or bottom of stairways, or properly secure the rug with carpet tape to prevent slippage. Get rid of throw rugs, if possible.  Have an electrician put in a light switch at the top and bottom of the stairs. OTHER FALL PREVENTION TIPS  Wear low-heel or rubber-soled shoes that are supportive and fit well. Wear closed toe shoes.  When using a stepladder, make sure it is fully opened and both spreaders are firmly locked. Do not climb a closed stepladder.  Add color or contrast paint or tape to grab bars and handrails in your home. Place contrasting color strips on first and last steps.  Learn and use  mobility aids as needed. Install an electrical emergency response system.  Turn on lights to avoid dark areas. Replace light bulbs that burn out immediately. Get light switches that glow.  Arrange furniture to create clear pathways. Keep furniture in the same place.  Firmly attach carpet with non-skid or double-sided tape.  Eliminate uneven floor surfaces.  Select a carpet pattern that does not visually hide the edge of steps.  Be aware of all pets. OTHER HOME SAFETY TIPS  Set the water temperature for 120 F  (48.8 C).  Keep emergency numbers on or near the telephone.  Keep smoke detectors on every level of the home and near sleeping areas. Document Released: 02/12/2002 Document Revised: 08/24/2011 Document Reviewed: 05/14/2011 [facility] Patient Information 2015 [place], [place]. This information is not intended to replace advice given to you by your health care provider. Make sure you discuss any questions you have with your health care provider.   Contusion A contusion is a deep bruise. Contusions are the result of an injury that caused bleeding under the skin. The contusion may turn blue, purple, or yellow. Minor injuries will give you a painless contusion, but more severe contusions may stay painful and swollen for a few weeks.  CAUSES  A contusion is usually caused by a blow, trauma, or direct force to an area of the body. SYMPTOMS   Swelling and redness of the injured area.  Bruising of the injured area.  Tenderness and soreness of the injured area.  Pain. DIAGNOSIS  The diagnosis can be made by taking a history and physical exam. An X-ray, CT scan, or MRI may be needed to determine if there were any associated injuries, such as fractures. TREATMENT  Specific treatment will depend on what area of the body was injured. In general, the best treatment for a contusion is resting, icing, elevating, and applying cold compresses to the injured area. Over-the-counter medicines may also be recommended for pain control. Ask your caregiver what the best treatment is for your contusion. HOME CARE INSTRUCTIONS   Put ice on the injured area.  Put ice in a plastic bag.  Place a towel between your skin and the bag.  Leave the ice on for 15-20 minutes, 3-4 times a day, or as directed by your health care provider.  Only take over-the-counter or prescription medicines for pain, discomfort, or fever as directed by your caregiver. Your caregiver may recommend avoiding anti-inflammatory medicines  (aspirin, ibuprofen, and naproxen) for 48 hours because these medicines may increase bruising.  Rest the injured area.  If possible, elevate the injured area to reduce swelling. SEEK IMMEDIATE MEDICAL CARE IF:   You have increased bruising or swelling.  You have pain that is getting worse.  Your swelling or pain is not relieved with medicines. MAKE SURE YOU:   Understand these instructions.  Will watch your condition.  Will get help right away if you are not doing well or get worse. Document Released: 12/02/2004 Document Revised: 02/27/2013 Document Reviewed: 12/28/2010 [facility] Patient Information 2015 [place], [place]. This information is not intended to replace advice given to you by your health care provider. Make sure you discuss any questions you have with your health care provider.

## 2014-03-05 ENCOUNTER — Other Ambulatory Visit: Payer: Self-pay | Admitting: Adult Health

## 2014-03-07 LAB — BASIC METABOLIC PANEL
BUN: 29 mg/dL — AB (ref 4–21)
Creatinine: 0.8 mg/dL (ref 0.5–1.1)
POTASSIUM: 4.5 mmol/L (ref 3.4–5.3)
SODIUM: 139 mmol/L (ref 137–147)

## 2014-03-12 ENCOUNTER — Other Ambulatory Visit: Payer: Self-pay | Admitting: Adult Health

## 2014-03-13 ENCOUNTER — Other Ambulatory Visit: Payer: Self-pay | Admitting: Internal Medicine

## 2014-03-13 ENCOUNTER — Other Ambulatory Visit: Payer: Self-pay | Admitting: Cardiology

## 2014-03-13 ENCOUNTER — Other Ambulatory Visit: Payer: Self-pay

## 2014-03-13 MED ORDER — METOPROLOL TARTRATE 100 MG PO TABS: 100.0000 mg | ORAL_TABLET | Freq: Two times a day (BID) | ORAL | Status: DC

## 2014-03-13 MED ORDER — SPIRONOLACTONE 25 MG PO TABS: 25.0000 mg | ORAL_TABLET | Freq: Every day | ORAL | Status: DC

## 2014-03-14 ENCOUNTER — Other Ambulatory Visit: Payer: Medicare Other

## 2014-03-14 ENCOUNTER — Ambulatory Visit: Payer: Medicare Other | Admitting: Cardiology

## 2014-04-02 ENCOUNTER — Other Ambulatory Visit: Payer: Self-pay | Admitting: Cardiology

## 2014-04-03 ENCOUNTER — Ambulatory Visit: Payer: Medicare Other | Admitting: Physical Therapy

## 2014-04-03 ENCOUNTER — Ambulatory Visit: Payer: Medicare Other | Attending: Family Medicine | Admitting: Occupational Therapy

## 2014-04-03 DIAGNOSIS — R279 Unspecified lack of coordination: Secondary | ICD-10-CM | POA: Diagnosis present

## 2014-04-03 DIAGNOSIS — R293 Abnormal posture: Secondary | ICD-10-CM | POA: Insufficient documentation

## 2014-04-03 DIAGNOSIS — R258 Other abnormal involuntary movements: Secondary | ICD-10-CM | POA: Insufficient documentation

## 2014-04-03 DIAGNOSIS — R269 Unspecified abnormalities of gait and mobility: Secondary | ICD-10-CM

## 2014-04-03 DIAGNOSIS — R531 Weakness: Secondary | ICD-10-CM | POA: Diagnosis not present

## 2014-04-03 DIAGNOSIS — R278 Other lack of coordination: Secondary | ICD-10-CM

## 2014-04-03 NOTE — Therapy (Signed)
[facility] [address] [place], [place], [address] Phone: [phone]   Fax:  [phone]  Occupational Therapy Evaluation  Patient Details  Name: Victoria Lindsey MRN: [id number] Date of Birth: [birth year] Referring Provider:  Elba Barman, MD  Encounter Date: 04/03/2014      OT End of Session - 04/03/14 1340    Visit Number 1   Number of Visits 17   Date for OT Re-Evaluation 06/01/14   Authorization Type Medicare   Authorization Time Period G code needed   Authorization - Visit Number 1   Authorization - Number of Visits 10   OT Start Time [id number]   OT Stop Time 1015   OT Time Calculation (min) 39 min   Activity Tolerance Patient tolerated treatment well   Behavior During Therapy [facility] for tasks assessed/performed      Past Medical History  Diagnosis Date  . Hypertension   . Parkinson's disease   . Hyperlipidemia   . Dizziness   . Orthostatic hypotension   . Anemia   . Tremor   . Breast cancer     LEFT BREAST  . Kidney failure     stage 3  . Hypothyroidism   . Prediabetes     Past Surgical History  Procedure Laterality Date  . Cardiovascular stress test  06/2007    NORMAL  . Breast lumpectomy    . Cholecystectomy    . Tonsillectomy    . Nasal sinus surgery      submucous resection late 1950s    There were no vitals taken for this visit.  Visit Diagnosis:  Decreased coordination  Weakness  Bradykinesia  Abnormality of gait      Subjective Assessment - 04/03/14 1733    Currently in Pain? No/denies          [facility] OT Assessment - 04/03/14 0001    Assessment   Diagnosis Parkinson's, TIA, history of falls  Pt was hospitalized in October 2015 with possible TIA.   Onset Date --  July 2015   Assessment Pt withhistory of falls, TIA and Parkinson's disease demonstrates decreased strength, decreased coordination, and decreased balance which impedes performance of ADLS/ IADLS.,    Prior Therapy Pt has had therapy at [facility] and home health that ended last week.   Precautions   Precautions Fall  Pt does not drive   Balance Screen   Has the patient fallen in the past 6 months Yes   How many times? 6   Has the patient had a decrease in activity level because of a fear of falling?  Yes   Is the patient reluctant to leave their home because of a fear of falling?  Yes   Home  Environment   Family/patient expects to be discharged to: Private residence   Living Arrangements Spouse/significant other   Available Help at Discharge Family   Lives With Spouse   Prior Function   Level of Independence Needs assistance with ADLs;Needs assistance with homemaking;Needs assistance with gait   ADL   Eating/Feeding Set up   Upper Body Bathing Supervision/safety   Lower Body Bathing Supervision/safety   Upper Body Dressing Minimal assistance;Increased time   Lower Body Dressing Moderate assistance   Toilet Tranfer Minimal assistance   Toilet Transer Equipment --  rolling walker   Mudlogger seat with back   ADL comments Pt's husband assists her with ADLS.   IADL  Light Housekeeping Needs help with all home maintenance tasks   Mobility   Mobility Status History of falls   Mobility Status Comments ambulates with a rolling walker   Written Expression   Dominant Hand Right   Handwriting 100% legible;Increased time;Mild micrographia   Cognition   Overall Cognitive Status Cognition to be further assessed in functional context PRN   Coordination   9 Hole Peg Test (p) RUE 34.57 secs, LUE 32.03 secs   Box and Blocks RUE 43 blocks, LUE 40 blocks   Tremors mild RUE   AROM   Overall AROM  Deficits   Overall AROM Comments mild limitation in bilateral UE supination and finger extension and thumb radial abduction      PPT# 2 (feeding): 14.66 secs PPT #4 (donning a jacket): 56.47 secs Standing functional  reach test: RUE 7.5 in, LUE: 6 in                   OT Short Term Goals - 04/03/14 1735    OT SHORT TERM GOAL #1   Title I with inital HEP.   Baseline ck 05/03/14   Time 4   Period Weeks   Status New   OT SHORT TERM GOAL #2   Title Pt / family will verbalize understanding of adapted strategies for ADLS/ IADLS.   Time 4   Period Weeks   Status New   OT SHORT TERM GOAL #3   Title Pt will demonstrate improved independence with dressing as evidenced by decreasing PPT #4 to 45 secs or less.   Time 4   Period Weeks   Status New   OT SHORT TERM GOAL #4   Title Pt will demonstrate ability to write 4 sentences with 100 % legibility and minimal decrease in letter size.   Time 4   Period Weeks   Status New           OT Long Term Goals - 04/03/14 1737    OT LONG TERM GOAL #1   Title Pt will perfom dressing with supervision/set up.   Baseline ck 06/01/14   Time 8   Period Weeks   Status New   OT LONG TERM GOAL #2   Title Pt will perform light home management/ snack prep with min A.   Time 8   Period Weeks   Status New   OT LONG TERM GOAL #3   Title Pt will demonstrate improved standing balance for ADLS as evidenced by increasing standing functional reach by 3 inches bilaterally.   Baseline RUE 7.5 inches, LUE 6 inches   Time 8   Period Weeks   Status New   OT LONG TERM GOAL #4   Title Pt will demonstrate increased ease with feeding as evidenced by decreasing PPT #2 to 10 secs or less.   Baseline 14.66 secs   Time 8   Period Weeks   Status New   OT LONG TERM GOAL #5   Title Pt/ family will verbalize understanding of compensatory strategies for cognition and ways to keep thinking skills sharp   Time 8   Period Weeks   Status New               Plan - 04/03/14 1342    Clinical Impression Statement Pt with history of falls, TIA and Parkinson's disease can benefit from skilled occupational therapy to maximize inddepnedence with ADLS/ IADLS.   Rehab  Potential Good   Clinical Impairments Affecting Rehab Potential decreased strength, decreased  coordination, decreased balance, bradykinesia, decreased A/ROM   OT Frequency 2x / week   OT Duration 8 weeks  plus eval   OT Treatment/Interventions Self-care/ADL training;Fluidtherapy;Moist Heat;DME and/or AE instruction;Patient/family education;Therapeutic exercises;Therapeutic activities;Therapeutic exercise;Ultrasound;Neuromuscular education;Passive range of motion;Cognitive remediation/compensation;Energy conservation;Parrafin;Cryotherapy   Plan issue intial HEP for coordination, adpated strategies for ADLS.   Consulted and Agree with Plan of Care Patient;Family member/caregiver   Family Member Consulted daughter          G-Codes - Apr 22, 2014 1349    Functional Assessment Tool Used standing functional reach: RUE 7.5 in, LUE 6 in,  PPT #2:  14.66 secs,       Problem List Patient Active Problem List   Diagnosis Date Noted  . Dizziness of unknown cause 02/21/2014  . Left ventricular diastolic dysfunction with preserved systolic function 59/74/1638  . Essential hypertension 12/30/2013  . Transaminitis 12/24/2013  . Palpitations 12/23/2013  . Respiratory insufficiency 12/22/2013  . Normocytic anemia 12/21/2013  . Thrombocytopenia 12/21/2013  . Abnormal ECG 12/15/2013  . Abnormal involuntary movement 12/15/2013  . Colorectal polyps 12/15/2013  . Chest pain 12/15/2013  . Arteriosclerosis of coronary artery 12/15/2013  . Chronic kidney disease (CKD), stage III (moderate) 12/15/2013  . Corn 12/15/2013  . Diabetes 12/15/2013  . Breathing difficult 12/15/2013  . Acid reflux 12/15/2013  . HLD (hyperlipidemia) 12/15/2013  . BP (high blood pressure) 12/15/2013  . FOM (frequency of micturition) 12/15/2013  . Breast cancer, female 12/15/2013  . Fungal infection of nail 12/15/2013  . Osteopenia 12/15/2013  . Raynaud's syndrome 12/15/2013  . Restless leg 12/15/2013  . Gougerout-Sjoegren  syndrome 12/15/2013  . Change in blood platelet count 12/15/2013  . Infection of urinary tract 12/15/2013  . Lower urinary tract infection 10/12/2013  . Idiopathic Parkinson's disease 09/06/2013  . Bladder spasm 09/05/2013  . Urgency of micturation 09/05/2013  . Dyspnea 06/15/2012  . Hyperglycemia 12/27/2011  . Sjogren's syndrome 12/17/2010  . TIA (transient ischemic attack) 12/17/2010  . Benign hypertensive heart disease without heart failure 09/01/2010  . Parkinson's disease 09/01/2010  . Hypothyroidism 09/01/2010  . Hypercholesterolemia 09/01/2010  . Chronic anemia 09/01/2010    Jehad Bisono 2014-04-22, 5:48 PM Theone Murdoch, OTR/L [phone] Phone: [phone] 5:48 PM 22-Apr-2014 [facility] [address] [place], [place], [address] Phone: [phone]   Fax:  [phone]

## 2014-04-03 NOTE — Therapy (Signed)
[facility] [address] [place], [place], [address] Phone: [phone]   Fax:  [phone]  Physical Therapy Evaluation  Patient Details  Name: Victoria Lindsey MRN: [id number] Date of Birth: [birth year] Referring Provider:  Elba Barman, MD  Encounter Date: 04/03/2014      PT End of Session - 04/03/14 1128    Visit Number 1   Number of Visits 17   Date for PT Re-Evaluation 06/03/14   PT Start Time 1022   PT Stop Time 1106   PT Time Calculation (min) 44 min   Equipment Utilized During Treatment Gait belt   Activity Tolerance Patient tolerated treatment well      Past Medical History  Diagnosis Date  . Hypertension   . Parkinson's disease   . Hyperlipidemia   . Dizziness   . Orthostatic hypotension   . Anemia   . Tremor   . Breast cancer     LEFT BREAST  . Kidney failure     stage 3  . Hypothyroidism   . Prediabetes     Past Surgical History  Procedure Laterality Date  . Cardiovascular stress test  06/2007    NORMAL  . Breast lumpectomy    . Cholecystectomy    . Tonsillectomy    . Nasal sinus surgery      submucous resection late 1950s    There were no vitals taken for this visit.  Visit Diagnosis:  Abnormality of gait  Posture abnormality  Bradykinesia      Subjective Assessment - 04/03/14 1028    Symptoms Pt is an 81 year old female who presents to OP PT with desires to learn to stand up straigther.  Pt had hospitalization in July 2081from a fall, where pt was on ventilator in ICU.  She had another fall on 02/28/14; she has been in [facility] then [place].  Prior to hospitalization, pt was using rollator walker, and now she uses rolling walker.     Patient Stated Goals Pt's goals are to stand straighter and to be able to do more things around house, to improve balance.  She wants to continue to live at home safely.   Currently in Pain? No/denies          [facility] PT Assessment  - 04/03/14 1032    Assessment   Medical Diagnosis Parkinson's disease   Precautions   Precautions Fall   Balance Screen   Has the patient fallen in the past 6 months Yes   How many times? 6  Falls occur with standing, can't stop when she loses balance   Has the patient had a decrease in activity level because of a fear of falling?  Yes   Is the patient reluctant to leave their home because of a fear of falling?  Yes   Home Environment   Living Enviornment Private residence   Living Arrangements Spouse/significant other  Daughter lives 35 minutes away   Available Help at Discharge Family   Type of [place] to enter   Entrance Stairs-Number of Steps 2   [place] One level   [place] Bedside commode;Walker - 2 wheels;Walker - 4 wheels;Shower seat;Grab bars - toilet;Grab bars - tub/shower  elevated toilet seat   Prior Function   Level of [place] Needs assistance with ADLs;Needs assistance with homemaking;Needs assistance with gait   Comments Has had HHPT and rehab at [facility] since  July   Observation/Other Assessments   Focus on Therapeutic Outcomes (FOTO)  Functional status Intake score 43; Neuro QOL score 27.4    Strength   Overall Strength --  ankle dorsiflexion 3/5   Overall Strength Comments hip flexion and knee flexion/extension grossly tested 4/5   Transfers   Transfers Sit to Stand;Stand to Sit   Sit to Stand With armrests;With upper extremity assist;From chair/3-in-1;5: Supervision   Stand to Sit 5: Supervision;With upper extremity assist;With armrests;To chair/3-in-1   Ambulation/Gait   Ambulation/Gait Yes   Ambulation/Gait Assistance 4: Min guard   Ambulation Distance (Feet) 120 Feet   Assistive device Rolling walker   Gait Pattern Decreased step length - right;Decreased step length - left;Narrow base of support  forward flexed posture   Ambulation Surface Indoor;Level   Gait velocity 24.63  sec=1.33   Standardized Balance Assessment   Standardized Balance Assessment Timed Up and Go Test;Berg Balance Test  Berg score 20/56 (<45 indicates incr. fall risk)   Berg Balance Test   Sit to Stand Able to stand using hands after several tries   Standing Unsupported Needs several tries to stand 30 seconds unsupported   Sitting with Back Unsupported but Feet Supported on Floor or Stool Able to sit safely and securely 2 minutes   Stand to Sit Controls descent by using hands   Transfers Able to transfer with verbal cueing and /or supervision   Standing Unsupported with Eyes Closed Able to stand 10 seconds with supervision   Standing Ubsupported with Feet Together Needs help to attain position but able to stand for 30 seconds with feet together   From Standing, Reach Forward with Outstretched Arm Can reach forward >5 cm safely (2")   From Standing Position, Pick up Object from Floor Unable to try/needs assist to keep balance   From Standing Position, Turn to Look Behind Over each Shoulder Needs supervision when turning   Turn 360 Degrees Needs assistance while turning   Standing Unsupported, Alternately Place Feet on Step/Stool Needs assistance to keep from falling or unable to try   Standing Unsupported, One Foot in Front Needs help to step but can hold 15 seconds   Standing on One Leg Unable to try or needs assist to prevent fall   Total Score 20   Timed Up and Go Test   TUG Normal TUG   Normal TUG (seconds) 39.87                            PT Short Term Goals - 04/03/14 1138    PT SHORT TERM GOAL #1   Title Pt will perform HEP with supervision for improved strength, balance, and gait. (Target date 04/02/14)   Time 4   Period Weeks   Status New   PT SHORT TERM GOAL #2   Title Pt will improve Berg Balance score to at least 25/56 for decreased fall risk.   Time 4   Period Weeks   Status New   PT SHORT TERM GOAL #3   Title Pt will improve TUG score to less than  or equal to 30 seconds for improved ADL participation in home.   Time 4   Period Weeks   Status New   PT SHORT TERM GOAL #4   Title Pt will perform at least 8 of 10 reps of sit<>stand with minimal UE support from <18 inch surfaces, for improved transfer efficiency and safety.   Time 4  Period Weeks   Status New           PT Long Term Goals - 04-23-14 1141    PT LONG TERM GOAL #1   Title Pt will verbalize understanding of fall prevention techniques within home environment. (Target date 06/03/14)   Time 8   Period Weeks   Status New   PT LONG TERM GOAL #2   Title Pt will improve Berg Balance score to at least 35/56 for decreased fall risk.   Time 8   Period Weeks   Status New   PT LONG TERM GOAL #3   Title Pt will imrpove TUG score to less than or equal to 20 seconds for decreased fall risk.   Time 8   Period Weeks   Status New   PT LONG TERM GOAL #4   Title Pt will improve gait velocity to at least 1.8 ft/sec for improved gait efficiency and safety.   Time 8   Period Weeks   Status New   PT LONG TERM GOAL #5   Title Pt will verbalize continued community fitness plans upon D/C from PT.   Time 8   Period Weeks   Status New               Plan - 23-Apr-2014 1128    Clinical Impression Statement Pt is an 81 year old female who presents to OP PT with Parkinson's disease, who has has multiple falls in the past 6 months.   Pt desires to be moving better so that she can take care of household needs and stay at home with her husband.  She presents with tremor, braydykinesia, forward flexed posture, with decreased balance and decreased timing and coordination with gait activities.    Pt will benefit from skilled therapeutic intervention in order to improve on the following deficits Abnormal gait;Decreased activity tolerance;Decreased balance;Decreased mobility;Decreased strength;Difficulty walking;Postural dysfunction   Rehab Potential Good   PT Frequency 2x / week   PT  Duration 8 weeks  plus evaluation   PT Treatment/Interventions ADLs/Self Care Home Management;Gait training;Stair training;Functional mobility training;Neuromuscular re-education;Balance training;Therapeutic exercise;Therapeutic activities;Patient/family education   PT Next Visit Plan Initiate HEP for balance, hip/ankle strengthening   Consulted and Agree with Plan of Care Patient;Family member/caregiver   Family Member Consulted daughter, Fabio Asa - 23-Apr-2014 1305    Functional Assessment Tool Used gait velocity 1.33 ft/sec, TUG 39.87 seconds, Berg 20/56   Functional Limitation Mobility: Walking and moving around   Mobility: Walking and Moving Around Current Status [phone]) At least 40 percent but less than 60 percent impaired, limited or restricted   Mobility: Walking and Moving Around Goal Status [phone]) At least 20 percent but less than 40 percent impaired, limited or restricted       Problem List Patient Active Problem List   Diagnosis Date Noted  . Dizziness of unknown cause 02/21/2014  . Left ventricular diastolic dysfunction with preserved systolic function 18/84/1660  . Essential hypertension 12/30/2013  . Transaminitis 12/24/2013  . Palpitations 12/23/2013  . Respiratory insufficiency 12/22/2013  . Normocytic anemia 12/21/2013  . Thrombocytopenia 12/21/2013  . Abnormal ECG 12/15/2013  . Abnormal involuntary movement 12/15/2013  . Colorectal polyps 12/15/2013  . Chest pain 12/15/2013  . Arteriosclerosis of coronary artery 12/15/2013  . Chronic kidney disease (CKD), stage III (moderate) 12/15/2013  . Corn 12/15/2013  . Diabetes 12/15/2013  . Breathing difficult 12/15/2013  . Acid reflux 12/15/2013  . HLD (  hyperlipidemia) 12/15/2013  . BP (high blood pressure) 12/15/2013  . FOM (frequency of micturition) 12/15/2013  . Breast cancer, female 12/15/2013  . Fungal infection of nail 12/15/2013  . Osteopenia 12/15/2013  . Raynaud's syndrome 12/15/2013  .  Restless leg 12/15/2013  . Gougerout-Sjoegren syndrome 12/15/2013  . Change in blood platelet count 12/15/2013  . Infection of urinary tract 12/15/2013  . Lower urinary tract infection 10/12/2013  . Idiopathic Parkinson's disease 09/06/2013  . Bladder spasm 09/05/2013  . Urgency of micturation 09/05/2013  . Dyspnea 06/15/2012  . Hyperglycemia 12/27/2011  . Sjogren's syndrome 12/17/2010  . TIA (transient ischemic attack) 12/17/2010  . Benign hypertensive heart disease without heart failure 09/01/2010  . Parkinson's disease 09/01/2010  . Hypothyroidism 09/01/2010  . Hypercholesterolemia 09/01/2010  . Chronic anemia 09/01/2010    Thaddus Mcdowell W. 04/03/2014, 1:11 PM  Mady Haagensen, PT 04/03/2014 1:11 PM Phone: [phone] Fax: [facility] [address] [place], [place], [address] Phone: [phone]   Fax:  [phone]

## 2014-04-05 ENCOUNTER — Telehealth: Payer: Self-pay | Admitting: *Deleted

## 2014-04-05 NOTE — Telephone Encounter (Signed)
Advised patient   Event monitor reviewed by  Dr. Mare Ferrari  Interpretation:  NSR, no AFib

## 2014-04-08 ENCOUNTER — Encounter: Payer: Self-pay | Admitting: Internal Medicine

## 2014-04-08 ENCOUNTER — Ambulatory Visit (INDEPENDENT_AMBULATORY_CARE_PROVIDER_SITE_OTHER): Payer: Medicare Other | Admitting: Internal Medicine

## 2014-04-08 VITALS — BP 128/80 | HR 74 | Temp 97.3°F | Resp 16 | Ht 61.0 in | Wt 116.0 lb

## 2014-04-08 DIAGNOSIS — G20A1 Parkinson's disease without dyskinesia, without mention of fluctuations: Secondary | ICD-10-CM

## 2014-04-08 DIAGNOSIS — I519 Heart disease, unspecified: Secondary | ICD-10-CM

## 2014-04-08 DIAGNOSIS — N183 Chronic kidney disease, stage 3 unspecified: Secondary | ICD-10-CM

## 2014-04-08 DIAGNOSIS — E119 Type 2 diabetes mellitus without complications: Secondary | ICD-10-CM | POA: Diagnosis not present

## 2014-04-08 DIAGNOSIS — D649 Anemia, unspecified: Secondary | ICD-10-CM

## 2014-04-08 DIAGNOSIS — R0600 Dyspnea, unspecified: Secondary | ICD-10-CM | POA: Diagnosis not present

## 2014-04-08 DIAGNOSIS — E7800 Pure hypercholesterolemia, unspecified: Secondary | ICD-10-CM

## 2014-04-08 DIAGNOSIS — I5189 Other ill-defined heart diseases: Secondary | ICD-10-CM

## 2014-04-08 DIAGNOSIS — M3500 Sicca syndrome, unspecified: Secondary | ICD-10-CM | POA: Diagnosis not present

## 2014-04-08 DIAGNOSIS — G20 Parkinson's disease: Secondary | ICD-10-CM

## 2014-04-08 DIAGNOSIS — I5030 Unspecified diastolic (congestive) heart failure: Secondary | ICD-10-CM | POA: Diagnosis not present

## 2014-04-08 DIAGNOSIS — G459 Transient cerebral ischemic attack, unspecified: Secondary | ICD-10-CM

## 2014-04-08 DIAGNOSIS — M8580 Other specified disorders of bone density and structure, unspecified site: Secondary | ICD-10-CM | POA: Diagnosis not present

## 2014-04-08 DIAGNOSIS — I10 Essential (primary) hypertension: Secondary | ICD-10-CM | POA: Diagnosis not present

## 2014-04-08 DIAGNOSIS — E039 Hypothyroidism, unspecified: Secondary | ICD-10-CM | POA: Diagnosis not present

## 2014-04-08 NOTE — Progress Notes (Signed)
Patient ID: Victoria Lindsey, female   DOB: Sep 22, 1925, 81 y.o.   MRN: [id number]   Location:  [facility]  Code Status: full code, has living will and hcpoa, but not on file  Allergies  Allergen Reactions  . Aciphex [Rabeprazole Sodium]   . Amantadines   . Avapro [Irbesartan]   . Carbidopa-Levodopa   . Cardura [Doxazosin Mesylate]   . Ciprofloxacin   . Clonidine Derivatives   . Cozaar   . Diltiazem   . Famotidine   . Hctz [Hydrochlorothiazide]   . Indapamide   . Lansoprazole   . Lisinopril     Mouth problems  . Loratadine   . Maxidex [Dexamethasone]   . Mirapex [Pramipexole Dihydrochloride]   . Norvasc [Amlodipine Besylate]   . Pantoprazole Sodium   . Penicillins   . Prilosec [Omeprazole]   . Procardia [Nifedipine]   . Proton Pump Inhibitors   . Sulfa Drugs Cross Reactors   . Trimethoprim   . Zyrtec [Cetirizine Hcl]     Chief Complaint  Patient presents with  . Establish Care    New patient establish care, discuss parkinsons. Fasting if any labs due. Discuss taking over ordering PT/OT     HPI: Patient is a 81 y.o. female seen in the office today to establish with the practice.  She is here with her daughter, [name],  who is a Software engineer.  Her PD bothers her the most.  Could not take sinemet or similar.  Takes azilect and rotigotine patch.  Has pilocarpine prn when mouth so dry she can't talk.    Has diabetes, but not on medications.  Is taking ensure which has a good bit of sugar in it.  Try glucerna instead--will give samples of butter pecan flavor that we have right now.  She and her husband live alone.  Her daughter has been helping some with meals.  Doing more easy to prepare foods.  Average over past month is lower 140s.  Last hba1c 6.4 in October.    Has seen Dr. Gwenette Greet for her breathing.  Has never been one to exercise.  Always felt she did not have the breath to talk even before she was diagnosed with PD.  Hemidiaphragm  elevated, but both sides move.  He felt deconditioning, frailty and anemia are causes for her dyspnea.  Had a fall in July with compression fx of L1.  Also had two rib fractures that were found several mos. later.  Had home health until bp out of control.  Was up to 160s for a while.  Finally was over 200 and went to hospital.  Had TIA before that, but no stroke identified.  Aspirated during MRI and wound up in ICU on ventilator.  Went to [facility] for rehab due to inability to walk from her PD at first.  Home health ended last week.  Had initial eval at [facility] neurorehab--will go twice weekly for PT, OT.  Did have ST before and after her hospitalization.  Handwriting and speech have improved.    Has dry mouth--Sjogren's.  Mouth peels inside.  Left side of lips also dry and peeling which started as cold sore.  Off pilocarpine.    Has been plagued with UTIs.  Had been on cephalexin maintenance for 3-4 mos in fall last year.  Then had spring infections.  Was restarted on maintenance during falls.  Not on for about a month or more.  Does not drink much per her  daughter.  Says she can't hold much.  Glass of water sits next to her all day.    Oncologist/gyn ok with putting her on premarin vaginal cream if UTIs recur. Takes the cranberry which may or may not help.       Hypothyroidism:  Dose increased to 178mcg from 175mcg when at [facility].  Was back and forth with generic and name brand.    Hyperlipidemia:  On crestor for quite a while.    Has tolerated furosemide despite sulfa allergy.  Was decreased from 40mg  to 20mg .    BPs at home in 140 range.  Had been creeping upward, but excellent here today.    Unclear if she truly had orthostatic hypotension--it is in her chart, but her daughter does not recall this.  Nebulizers were ineffective for her breathing.  She felt like it helped.  Oxygen gives her relief.    Generally, memory is good.  Her husband not so much.  Breast cancer was 2010--had  lumpectomy and XRT done.  No oral medications whatsoever.  She had ER+ but it was very very small.   She thinks she is due for a mammogram, but does not know if she wants another one--has had them at [facility].    Had a pneumonia vaccine 05/23/13, but unclear if it was prevnar or pneumovax.    Last fall was on Christmas day.  Review of Systems:  Review of Systems  Constitutional: Positive for weight loss.       Deconditioning  HENT:       Nasal drainage and loss of smell  Eyes:       Dry eyes, itching  Respiratory: Positive for shortness of breath.   Cardiovascular:       High blood pressure  Gastrointestinal: Positive for heartburn.       Dysphagia  Genitourinary: Positive for urgency.       "recurrent UTIs"  Musculoskeletal: Positive for back pain.  Neurological: Positive for tremors and weakness.       Loss of balance; h/o TIA  Endo/Heme/Allergies:       Diabetes, thyroid disorder, estrogen deficient; bruising and anemia  Psychiatric/Behavioral: Positive for memory loss.     Past Medical History  Diagnosis Date  . Hypertension   . Parkinson's disease   . Hyperlipidemia   . Dizziness   . Orthostatic hypotension   . Anemia   . Tremor   . Breast cancer     LEFT BREAST  . Kidney failure     stage 3  . Hypothyroidism   . Prediabetes   . TIA (transient ischemic attack)   . Hyperglycemia   . Left ventricular diastolic dysfunction     Past Surgical History  Procedure Laterality Date  . Cardiovascular stress test  06/2007    NORMAL  . Breast lumpectomy  2010  . Cholecystectomy  1994  . Tonsillectomy    . Nasal sinus surgery      submucous resection late 1950s  . Cataract extraction  2005,2006    Dr.Devonzo    Social History:   reports that she quit smoking about 50 years ago. Her smoking use included Cigarettes. She has a 6 pack-year smoking history. She has never used smokeless tobacco. She reports that she does not drink alcohol or use illicit drugs.  Family  History  Problem Relation Age of Onset  . Heart disease Mother   . Heart failure Father   . Parkinsonism Father   . COPD Sister   .  Cancer Brother   . Osteoporosis Daughter   . Diabetes Father   . Stroke Sister     Medications: Patient's Medications  New Prescriptions   No medications on file  Previous Medications   ASPIRIN 81 MG TABLET    Take 81 mg by mouth every evening.    BIOTIN PO    Take 1,000 mcg by mouth daily.    CEPHALEXIN (KEFLEX) 250 MG CAPSULE    Take 250 mg by mouth at bedtime. Take 1 at bedtime.   CHOLECALCIFEROL (VITAMIN D3) 2000 UNITS TABS    Take 1 tablet by mouth daily.    CLOPIDOGREL (PLAVIX) 75 MG TABLET    TAKE 1 TABLET BY MOUTH EVERY DAY   CRANBERRY 500 MG CAPS    Take 500 mg by mouth daily.    DOCUSATE SODIUM (COLACE) 100 MG CAPSULE    Take 100 mg by mouth daily.   FOLIC ACID PO    Take 195 mcg by mouth daily.    FUROSEMIDE (LASIX) 20 MG TABLET    Take 1 tablet (20 mg total) by mouth daily.   METOPROLOL (LOPRESSOR) 100 MG TABLET    Take 1 tablet (100 mg total) by mouth 2 (two) times daily.   MULTIPLE VITAMIN (MULTIVITAMIN WITH MINERALS) TABS TABLET    Take 1 tablet by mouth daily.   ONE TOUCH ULTRA TEST TEST STRIP       PILOCARPINE (SALAGEN) 5 MG TABLET    Take 5 mg by mouth daily.   RASAGILINE (AZILECT) 1 MG TABS    Take 1 mg by mouth daily.   [place]    by Does not apply route.   ROSUVASTATIN (CRESTOR) 10 MG TABLET    Take 10 mg by mouth every evening.    ROTIGOTINE 8 MG/24HR PT24    Place 1 patch onto the skin daily.    SPIRONOLACTONE (ALDACTONE) 25 MG TABLET    Take 1 tablet (25 mg total) by mouth daily.   SYNTHROID 112 MCG TABLET    Take 112 mcg by mouth daily.  Modified Medications   No medications on file  Discontinued Medications   No medications on file     Physical Exam: Filed Vitals:   04/08/14 0831  BP: 128/80  Pulse: 74  Temp: 97.3 F (36.3 C)  TempSrc: Oral  Resp: 16  Height: 5\' 1"  (1.549 m)  Weight:  116 lb (52.617 kg)  SpO2: 96%  Physical Exam  Constitutional: She is oriented to person, place, and time. She appears well-developed and well-nourished. No distress.  HENT:  Head: Normocephalic and atraumatic.  Mouth/Throat: No oropharyngeal exudate.  Cardiovascular: Normal rate, regular rhythm, normal heart sounds and intact distal pulses.   Pulmonary/Chest: Effort normal and breath sounds normal. No respiratory distress.  Abdominal: Bowel sounds are normal.  Musculoskeletal: Normal range of motion. She exhibits no edema or tenderness.  Neurological: She is alert and oriented to person, place, and time.  Resting tremor left greater than right cogwheeling rigidity  Skin: Skin is warm and dry. There is pallor.  Psychiatric: She has a normal mood and affect. Her behavior is normal. Judgment and thought content normal.    Labs reviewed: Basic Metabolic Panel:  Recent Labs  12/21/13 1940  12/24/13 0309 12/25/13 0844 12/26/13 0555 03/07/14 1155  NA  --   < > 136* 139 139 139  K  --   < > 4.0 4.0 4.4 4.5  CL  --   < >  99 101 103  --   CO2  --   < > 26 26 26   --   GLUCOSE  --   < > 113* 134* 97  --   BUN  --   < > 31* 31* 32* 29*  CREATININE  --   < > 0.68 0.63 0.68 0.8  CALCIUM  --   < > 9.1 9.2 8.8  --   TSH 7.040*  --   --   --   --   --   < > = values in this interval not displayed. Liver Function Tests:  Recent Labs  12/21/13 2300 12/22/13 0239 12/25/13 0844  AST 56* 58* 67*  ALT 55* 57* 51*  ALKPHOS 135* 136* 128*  BILITOT 0.3 0.4 0.5  PROT 6.7 6.9 6.8  ALBUMIN 2.9* 3.0* 3.2*   No results for input(s): LIPASE, AMYLASE in the last 8760 hours. No results for input(s): AMMONIA in the last 8760 hours. CBC:  Recent Labs  10/18/13 1436 12/21/13 1514  12/22/13 0239 12/24/13 0309 12/26/13 0555  WBC 5.6 4.8  < > 7.9 5.0 5.4  NEUTROABS 3.0 2.6  --   --   --   --   HGB 11.2* 11.1*  < > 10.9* 10.6* 10.1*  HCT 33.3* 33.9*  < > 33.7* 32.4* 31.3*  MCV 97.5 95.5  < >  97.7 95.6 98.1  PLT 142.0* 85*  < > 115* 107* 126*  < > = values in this interval not displayed. Lipid Panel:  Recent Labs  12/22/13 0239  CHOL 99  HDL 41  LDLCALC 31  TRIG 133  CHOLHDL 2.4   Lab Results  Component Value Date   HGBA1C 6.4* 12/21/2013   Assessment/Plan 1. Idiopathic Parkinson's disease -follows with two neurologists at present--Dr. Leonie Man is the local one at [facility] neurology who saw her after her TIA -has seen Dr. Baltazar Najjar, as well -continue azilect and rotigotine patch   2. Hypothyroidism, unspecified hypothyroidism type -cont current synthroid 153mcg--will f/u levels due to variations between brand and generics she's received during rehab - TSH  3. Type 2 diabetes mellitus without complication - has been diet controlled, cont this and change from ensure to glucerna to avoid high sugar content in supplements to help maintain weight  -not on ace/arb due to prior intolerances -will see when last urine microalbumin was done, foot and eye exams (Dr. Pierre Bali) - Basic metabolic panel - Hemoglobin A1c  4. Chronic kidney disease (CKD), stage III (moderate) -avoid nsaids and nephrotoxic agents, cont to monitor, may be secondary to her diabetes and hypertension - f/u Basic metabolic panel  5. Hypercholesterolemia -continues on crestor for some time now, f/u flp - Lipid panel  6. Normocytic anemia -cont folic acid supplement and monitor - CBC with Differential/Platelet  7. Dyspnea -felt by pulmonary, Dr. Gwenette Greet, to be due to her frailty, deconditioning  8. Osteopenia -cont vitamin D supplement 2000 units daily, encouraged weight bearing exercise -last bone density was 2013 per new pt paperwork--need report in old records  76. Gougerout-Sjoegren syndrome -cont pilocarpine when dry mouth becomes severe -follows with Dr. Estanislado Pandy for this -also uses lozenges and biotene and has dry eyes uses eye drops  10. Left ventricular diastolic dysfunction with  preserved systolic function -not felt to be a significant contributor to her dyspnea -cont baby asa, statin, beta blocker -did not tolerate ace/arbs for a variety of reasons per her allergy list, takes spironolactone and lasix 20mg  with extra 1/2 tab if  bp is over 388 systolic  11. Transient cerebral ischemia, unspecified transient cerebral ischemia type -in context of hypertensive emergency -no further events since -cont secondary prevention with bp, lipid, glucose control  12. Essential hypertension -bp at visit was at goal which is less than 150/90 considering her frailty and risk for orthostatic hypotension with her parkinson's  Labs/tests ordered: Orders Placed This Encounter  Procedures  . HM MAMMOGRAPHY    This external order was created through the Results Console.  Marland Kitchen HM DEXA SCAN    This external order was created through the Results Console.  . Basic metabolic panel    This external order was created through the Results Console.  Marland Kitchen CBC with Differential/Platelet  . Basic metabolic panel    Order Specific Question:  Has the patient fasted?    Answer:  Yes  . Hemoglobin A1c  . Lipid panel    Order Specific Question:  Has the patient fasted?    Answer:  Yes  . TSH    Next appt:  3 mos for annual exam  Dagmawi Venable L. Eunice Oldaker, D.O. [facility] [address], [place] [address] Cell Phone (Mon-Fri 8am-5pm):  [phone] On Call:  [phone] & follow prompts after 5pm & weekends Office Phone:  [phone] Office Fax:  [phone]

## 2014-04-09 ENCOUNTER — Ambulatory Visit: Payer: Medicare Other | Admitting: Physical Therapy

## 2014-04-09 ENCOUNTER — Encounter: Payer: Self-pay | Admitting: Physical Therapy

## 2014-04-09 ENCOUNTER — Ambulatory Visit: Payer: Medicare Other | Attending: Family Medicine | Admitting: Occupational Therapy

## 2014-04-09 DIAGNOSIS — R531 Weakness: Secondary | ICD-10-CM | POA: Diagnosis not present

## 2014-04-09 DIAGNOSIS — R278 Other lack of coordination: Secondary | ICD-10-CM

## 2014-04-09 DIAGNOSIS — R258 Other abnormal involuntary movements: Secondary | ICD-10-CM

## 2014-04-09 DIAGNOSIS — R279 Unspecified lack of coordination: Secondary | ICD-10-CM | POA: Insufficient documentation

## 2014-04-09 DIAGNOSIS — R269 Unspecified abnormalities of gait and mobility: Secondary | ICD-10-CM | POA: Insufficient documentation

## 2014-04-09 DIAGNOSIS — R293 Abnormal posture: Secondary | ICD-10-CM | POA: Diagnosis not present

## 2014-04-09 LAB — HEMOGLOBIN A1C
Est. average glucose Bld gHb Est-mCnc: 137 mg/dL
Hgb A1c MFr Bld: 6.4 % — ABNORMAL HIGH (ref 4.8–5.6)

## 2014-04-09 LAB — BASIC METABOLIC PANEL
BUN/Creatinine Ratio: 28 — ABNORMAL HIGH (ref 11–26)
BUN: 23 mg/dL (ref 8–27)
CO2: 24 mmol/L (ref 18–29)
Calcium: 9.6 mg/dL (ref 8.7–10.3)
Chloride: 100 mmol/L (ref 97–108)
Creatinine, Ser: 0.81 mg/dL (ref 0.57–1.00)
GFR calc Af Amer: 76 mL/min/{1.73_m2} (ref >59)
GFR calc non Af Amer: 66 mL/min/{1.73_m2} (ref >59)
Glucose: 86 mg/dL (ref 65–99)
Potassium: 5.3 mmol/L — ABNORMAL HIGH (ref 3.5–5.2)
Sodium: 137 mmol/L (ref 134–144)

## 2014-04-09 LAB — CBC WITH DIFFERENTIAL/PLATELET
Basophils Absolute: 0 10*3/uL (ref 0.0–0.2)
Basos: 0 %
Eos: 3 %
Eosinophils Absolute: 0.2 10*3/uL (ref 0.0–0.4)
HCT: 36.7 % (ref 34.0–46.6)
Hemoglobin: 11.3 g/dL (ref 11.1–15.9)
Immature Grans (Abs): 0 10*3/uL (ref 0.0–0.1)
Immature Granulocytes: 0 %
Lymphocytes Absolute: 1.6 10*3/uL (ref 0.7–3.1)
Lymphs: 23 %
MCH: 29.7 pg (ref 26.6–33.0)
MCHC: 30.8 g/dL — ABNORMAL LOW (ref 31.5–35.7)
MCV: 96 fL (ref 79–97)
Monocytes Absolute: 0.8 10*3/uL (ref 0.1–0.9)
Monocytes: 11 %
Neutrophils Absolute: 4.4 10*3/uL (ref 1.4–7.0)
Neutrophils Relative %: 63 %
Platelets: 130 10*3/uL — ABNORMAL LOW (ref 150–379)
RBC: 3.81 x10E6/uL (ref 3.77–5.28)
RDW: 17.6 % — ABNORMAL HIGH (ref 12.3–15.4)
WBC: 7 10*3/uL (ref 3.4–10.8)

## 2014-04-09 LAB — TSH: TSH: 5.75 u[IU]/mL — ABNORMAL HIGH (ref 0.450–4.500)

## 2014-04-09 LAB — LIPID PANEL
Chol/HDL Ratio: 2.4 ratio units (ref 0.0–4.4)
Cholesterol, Total: 114 mg/dL (ref 100–199)
HDL: 47 mg/dL (ref >39)
LDL Calculated: 48 mg/dL (ref 0–99)
Triglycerides: 96 mg/dL (ref 0–149)
VLDL Cholesterol Cal: 19 mg/dL (ref 5–40)

## 2014-04-09 NOTE — Therapy (Signed)
[facility] [address] [place], [place], [address] Phone: [phone]   Fax:  [phone]  Physical Therapy Treatment  Patient Details  Name: Victoria Lindsey MRN: [id number] Date of Birth: 04-04-1925 Referring Provider:  Elba Barman, MD  Encounter Date: 04/09/2014      PT End of Session - 04/09/14 1218    Visit Number 2   Number of Visits 17   Date for PT Re-Evaluation 06/03/14   PT Start Time 1103   PT Stop Time 1146   PT Time Calculation (min) 43 min   Activity Tolerance Patient tolerated treatment well   Behavior During Therapy [facility] for tasks assessed/performed      Past Medical History  Diagnosis Date  . Hypertension   . Parkinson's disease   . Hyperlipidemia   . Dizziness   . Orthostatic hypotension   . Anemia   . Tremor   . Breast cancer     LEFT BREAST  . Kidney failure     stage 3  . Hypothyroidism   . Prediabetes   . TIA (transient ischemic attack)   . Hyperglycemia   . Left ventricular diastolic dysfunction     Past Surgical History  Procedure Laterality Date  . Cardiovascular stress test  06/2007    NORMAL  . Breast lumpectomy  2010  . Cholecystectomy  1994  . Tonsillectomy    . Nasal sinus surgery      submucous resection late 1950s  . Cataract extraction  2005,2006    Dr.Devonzo    There were no vitals taken for this visit.  Visit Diagnosis:  Bradykinesia  Decreased coordination  Weakness  Abnormality of gait  Posture abnormality      Subjective Assessment - 04/09/14 1109    Symptoms Denies falls or changes since last visit.   Currently in Pain? No/denies                    [facility] Adult PT Treatment/Exercise - 04/09/14 1137    Transfers   Transfers Sit to Stand;Stand to Sit   Sit to Stand With armrests;With upper extremity assist;From chair/3-in-1;5: Supervision   Sit to Stand Details (indicate cue type and reason) repeated x 10 for half stand  and x 5 for full stand   Stand to Sit 5: Supervision;With upper extremity assist;With armrests;To chair/3-in-1   Knee/Hip Exercises: Aerobic   Stationary Bike Nustep all 4 extremities x 4 min x 2 with 1 minute rest break at level 2   Knee/Hip Exercises: Standing   Heel Raises 15 reps   Knee Flexion Both;15 reps   Other Standing Knee Exercises bil toe raise x 20, bil hip abduction x 20, bil hip extension x 20   Other Standing Knee Exercises All exercises performed standing at walker   Ankle Exercises: Stretches   Other Stretch seated bil heel cord stretch with black band x 30 sec x 3                PT Education - 04/09/14 1218    Education provided Yes   Education Details HEP   Person(s) Educated Patient;Spouse   Methods Explanation;Demonstration;Handout;Verbal cues   Comprehension Verbalized understanding;Returned demonstration          PT Short Term Goals - 04/03/14 1138    PT SHORT TERM GOAL #1   Title Pt will perform HEP with supervision for improved strength, balance, and gait. (Target date 04/02/14)   Time 4  Period Weeks   Status New   PT SHORT TERM GOAL #2   Title Pt will improve Berg Balance score to at least 25/56 for decreased fall risk.   Time 4   Period Weeks   Status New   PT SHORT TERM GOAL #3   Title Pt will improve TUG score to less than or equal to 30 seconds for improved ADL participation in home.   Time 4   Period Weeks   Status New   PT SHORT TERM GOAL #4   Title Pt will perform at least 8 of 10 reps of sit<>stand with minimal UE support from <18 inch surfaces, for improved transfer efficiency and safety.   Time 4   Period Weeks   Status New           PT Long Term Goals - 04/03/14 1141    PT LONG TERM GOAL #1   Title Pt will verbalize understanding of fall prevention techniques within home environment. (Target date 06/03/14)   Time 8   Period Weeks   Status New   PT LONG TERM GOAL #2   Title Pt will improve Berg Balance score to at  least 35/56 for decreased fall risk.   Time 8   Period Weeks   Status New   PT LONG TERM GOAL #3   Title Pt will imrpove TUG score to less than or equal to 20 seconds for decreased fall risk.   Time 8   Period Weeks   Status New   PT LONG TERM GOAL #4   Title Pt will improve gait velocity to at least 1.8 ft/sec for improved gait efficiency and safety.   Time 8   Period Weeks   Status New   PT LONG TERM GOAL #5   Title Pt will verbalize continued community fitness plans upon D/C from PT.   Time 8   Period Weeks   Status New               Plan - 04/09/14 1219    Clinical Impression Statement Pt has been performing HEP given by HHPT.  Updated HEP and provided handout.  Continue PT per POC>   Pt will benefit from skilled therapeutic intervention in order to improve on the following deficits Abnormal gait;Decreased activity tolerance;Decreased balance;Decreased mobility;Decreased strength;Difficulty walking;Postural dysfunction   Rehab Potential Good   PT Frequency 2x / week   PT Duration 8 weeks   PT Treatment/Interventions ADLs/Self Care Home Management;Gait training;Stair training;Functional mobility training;Neuromuscular re-education;Balance training;Therapeutic exercise;Therapeutic activities;Patient/family education   PT Next Visit Plan Endurance and strengthening, balance, standing PWR! at counter?   Consulted and Agree with Plan of Care Patient;Family member/caregiver   Family Member Consulted husband        Problem List Patient Active Problem List   Diagnosis Date Noted  . Dizziness of unknown cause 02/21/2014  . Left ventricular diastolic dysfunction with preserved systolic function 49/67/5916  . Essential hypertension 12/30/2013  . Transaminitis 12/24/2013  . Palpitations 12/23/2013  . Respiratory insufficiency 12/22/2013  . Normocytic anemia 12/21/2013  . Thrombocytopenia 12/21/2013  . Abnormal ECG 12/15/2013  . Abnormal involuntary movement 12/15/2013   . Colorectal polyps 12/15/2013  . Chest pain 12/15/2013  . Arteriosclerosis of coronary artery 12/15/2013  . Chronic kidney disease (CKD), stage III (moderate) 12/15/2013  . Corn 12/15/2013  . Diabetes 12/15/2013  . Breathing difficult 12/15/2013  . Acid reflux 12/15/2013  . HLD (hyperlipidemia) 12/15/2013  . BP (high blood pressure) 12/15/2013  .  FOM (frequency of micturition) 12/15/2013  . Breast cancer, female 12/15/2013  . Fungal infection of nail 12/15/2013  . Osteopenia 12/15/2013  . Raynaud's syndrome 12/15/2013  . Restless leg 12/15/2013  . Gougerout-Sjoegren syndrome 12/15/2013  . Change in blood platelet count 12/15/2013  . Infection of urinary tract 12/15/2013  . Lower urinary tract infection 10/12/2013  . Idiopathic Parkinson's disease 09/06/2013  . Bladder spasm 09/05/2013  . Urgency of micturation 09/05/2013  . Dyspnea 06/15/2012  . Hyperglycemia 12/27/2011  . Sjogren's syndrome 12/17/2010  . TIA (transient ischemic attack) 12/17/2010  . Benign hypertensive heart disease without heart failure 09/01/2010  . Parkinson's disease 09/01/2010  . Hypothyroidism 09/01/2010  . Hypercholesterolemia 09/01/2010  . Chronic anemia 09/01/2010    Narda Bonds 04/09/2014, 12:22 PM  [facility] [address] [place], [place], [address] Phone: [phone]   Fax:  [place], [place] 04/09/2014 12:22 PM Phone: [phone] Fax: [phone]

## 2014-04-09 NOTE — Therapy (Signed)
[facility] [address] [place], [place], [address] Phone: [phone]   Fax:  [phone]  Occupational Therapy Treatment  Patient Details  Name: Victoria Lindsey MRN: [id number] Date of Birth: 08/09/1925 Referring Provider:  Elba Barman, MD  Encounter Date: 04/09/2014      OT End of Session - 04/09/14 1052    Visit Number 2   Number of Visits 17   Date for OT Re-Evaluation 06/01/14   Authorization Type Medicare   Authorization Time Period G code needed   Authorization - Visit Number 2   Authorization - Number of Visits 10   OT Start Time 1017   OT Stop Time 1058   OT Time Calculation (min) 41 min   Activity Tolerance Patient tolerated treatment well   Behavior During Therapy [facility] for tasks assessed/performed      Past Medical History  Diagnosis Date  . Hypertension   . Parkinson's disease   . Hyperlipidemia   . Dizziness   . Orthostatic hypotension   . Anemia   . Tremor   . Breast cancer     LEFT BREAST  . Kidney failure     stage 3  . Hypothyroidism   . Prediabetes   . TIA (transient ischemic attack)   . Hyperglycemia   . Left ventricular diastolic dysfunction     Past Surgical History  Procedure Laterality Date  . Cardiovascular stress test  06/2007    NORMAL  . Breast lumpectomy  2010  . Cholecystectomy  1994  . Tonsillectomy    . Nasal sinus surgery      submucous resection late 1950s  . Cataract extraction  2005,2006    Dr.Devonzo    There were no vitals taken for this visit.  Visit Diagnosis:  Bradykinesia  Decreased coordination  Weakness      Subjective Assessment - 04/09/14 1023    Currently in Pain? No/denies                         OT Education - 04/09/14 1034    Education provided Yes   Education Details PWR! Basic 4 seated   Person(s) Educated Patient;Spouse   Methods Explanation;Demonstration;Verbal cues;Handout   Comprehension  Verbalized understanding;Returned demonstration;Verbal cues required          OT Short Term Goals - 04/03/14 1735    OT SHORT TERM GOAL #1   Title I with inital HEP.   Baseline ck 05/03/14   Time 4   Period Weeks   Status New   OT SHORT TERM GOAL #2   Title Pt / family will verbalize understanding of adapted strategies for ADLS/ IADLS.   Time 4   Period Weeks   Status New   OT SHORT TERM GOAL #3   Title Pt will demonstrate improved indpendence with dressing as evidenced by decreasing PPT #4 to 45 secs or less.   Time 4   Period Weeks   Status New   OT SHORT TERM GOAL #4   Title Pt will demonstrate ability to write 4 sentences with 100 % legibility and minimal decrease in letter size.   Time 4   Period Weeks   Status New           OT Long Term Goals - 04/03/14 1737    OT LONG TERM GOAL #1   Title Pt will perfom dressing with supervision/set up.   Baseline ck 06/01/14   Time 8  Period Weeks   Status New   OT LONG TERM GOAL #2   Title Pt will perform light home management/ snack prep with min A.   Time 8   Period Weeks   Status New   OT LONG TERM GOAL #3   Title Pt will demonstrate improved standing balance for ADLS as evidenced by increasing standing functional reach by 3 inches bilaterally.   Baseline RUE 7.5 inches, LUE 6 inches   Time 8   Period Weeks   Status New   OT LONG TERM GOAL #4   Title Pt will demonstrate increased ease with feeding as evidenced by decreasing PPT #2 to 10 secs or less.   Baseline 14.66 secs   Time 8   Period Weeks   Status New   OT LONG TERM GOAL #5   Title Pt/ family will verbalize understanding of compensatory strategies for cognition and ways to keep thinking skills sharp   Time 8   Period Weeks   Status New      Treatment:Pt was instructed in PWR moves basic 4 seated exercises 20 reps each, pt required demonstration and min-mod v.c. UBE x 5 mins min v.c. For speed, pt was able to maintain 25-35 RPM. Fine motor  coordination activities with emphasis on big movements and finger/ thumb extension: flipping/ dealing playing cards, picking up coins, and rotating ball in hand, min v.c. For performance.          Plan - 04/09/14 1053    Clinical Impression Statement Pt is progressing towards goals, with emphasis on big movment.   Rehab Potential Good   Clinical Impairments Affecting Rehab Potential decreased strength, decreased coordination, bradykinesia   OT Frequency 2x / week   OT Duration 8 weeks   OT Treatment/Interventions Self-care/ADL training;Fluidtherapy;DME and/or AE instruction;Splinting;Patient/family education;Balance training;Therapeutic exercises;Ultrasound;Therapeutic exercise;Therapeutic activities;Cognitive remediation/compensation;Passive range of motion;Functional Mobility Training;Neuromuscular education;Iontophoresis;Parrafin;Energy conservation;Manual Therapy;Visual/perceptual remediation/compensation   Plan Reinforce PWR moves, basic 4 seated, issue coordination HEP   OT Home Exercise Plan PWR seated   Consulted and Agree with Plan of Care Patient   Family Member Consulted husband        Problem List Patient Active Problem List   Diagnosis Date Noted  . Dizziness of unknown cause 02/21/2014  . Left ventricular diastolic dysfunction with preserved systolic function 28/41/3244  . Essential hypertension 12/30/2013  . Transaminitis 12/24/2013  . Palpitations 12/23/2013  . Respiratory insufficiency 12/22/2013  . Normocytic anemia 12/21/2013  . Thrombocytopenia 12/21/2013  . Abnormal ECG 12/15/2013  . Abnormal involuntary movement 12/15/2013  . Colorectal polyps 12/15/2013  . Chest pain 12/15/2013  . Arteriosclerosis of coronary artery 12/15/2013  . Chronic kidney disease (CKD), stage III (moderate) 12/15/2013  . Corn 12/15/2013  . Diabetes 12/15/2013  . Breathing difficult 12/15/2013  . Acid reflux 12/15/2013  . HLD (hyperlipidemia) 12/15/2013  . BP (high blood  pressure) 12/15/2013  . FOM (frequency of micturition) 12/15/2013  . Breast cancer, female 12/15/2013  . Fungal infection of nail 12/15/2013  . Osteopenia 12/15/2013  . Raynaud's syndrome 12/15/2013  . Restless leg 12/15/2013  . Gougerout-Sjoegren syndrome 12/15/2013  . Change in blood platelet count 12/15/2013  . Infection of urinary tract 12/15/2013  . Lower urinary tract infection 10/12/2013  . Idiopathic Parkinson's disease 09/06/2013  . Bladder spasm 09/05/2013  . Urgency of micturation 09/05/2013  . Dyspnea 06/15/2012  . Hyperglycemia 12/27/2011  . Sjogren's syndrome 12/17/2010  . TIA (transient ischemic attack) 12/17/2010  . Benign hypertensive heart disease without heart failure  09/01/2010  . Parkinson's disease 09/01/2010  . Hypothyroidism 09/01/2010  . Hypercholesterolemia 09/01/2010  . Chronic anemia 09/01/2010    Manoah Deckard 04/09/2014, 10:57 AM Theone Murdoch, OTR/L [phone] Phone: [phone] 10:57 AM 04/09/2014 [facility] [address] [place], [place], [address] Phone: [phone]   Fax:  [phone]

## 2014-04-09 NOTE — Patient Instructions (Addendum)
Toe / Heel Raise (Standing)   Standing with support, raise heels, then rock back on heels and raise toes. Repeat 15 times. Gastroc / Heel Cord Stretch - Seated With Towel   Sit on floor, black band around ball of foot. Gently pull foot in toward body, stretching heel cord and calf. Hold for 30 seconds. Repeat on other leg. Repeat 3 times. Do 3 times per day.  Copyright  VHI. All rights reserved.    HIP / KNEE: Extension - Standing   Squeeze glutes. Raise and lift leg backward. Keep knee straight or slightly bent. 15 reps , 2 sets per day. Hold onto a support.  Copyright  VHI. All rights reserved.    ABDUCTION: Standing (Active)   Stand, feet flat. Lift right leg out to side. Complete 15 repetitions. Perform 2 sessions per day.  http://gtsc.exer.us/111   Copyright  VHI. All rights reserved.     Copyright  VHI. All rights reserved.  "I love a Parade" Lift   Using a chair if necessary, march in place as high as you can. Repeat 15 times. Do 2 sessions per day.  http://gt2.exer.us/345   Copyright  VHI. All rights reserved.

## 2014-04-09 NOTE — Addendum Note (Signed)
Addended by: Theone Murdoch B on: 04/09/2014 05:15 PM   Modules accepted: Orders

## 2014-04-10 ENCOUNTER — Telehealth: Payer: Self-pay

## 2014-04-10 DIAGNOSIS — R7989 Other specified abnormal findings of blood chemistry: Secondary | ICD-10-CM

## 2014-04-10 NOTE — Telephone Encounter (Signed)
-----   Message from [name], DO sent at 04/09/2014 11:22 AM EST ----- Please notify her daughter about this and her labs.  I did not comment on her TSH with the other labs: TSH also is slightly elevated but improved from last evaluation.  I would prefer to recheck the TSH in 6 weeks before adjusting her medication.

## 2014-04-10 NOTE — Telephone Encounter (Signed)
Spoke with patient's daughter, discussed TSH results. Patient scheduled appointment for May 22, 2014 to recheck TSH.

## 2014-04-12 ENCOUNTER — Ambulatory Visit: Payer: Medicare Other | Admitting: Occupational Therapy

## 2014-04-12 ENCOUNTER — Ambulatory Visit: Payer: Medicare Other | Admitting: Physical Therapy

## 2014-04-12 ENCOUNTER — Telehealth: Payer: Self-pay | Admitting: Cardiology

## 2014-04-12 NOTE — Telephone Encounter (Signed)
Discussed further with daughter and  Dr. Mare Ferrari  Daughter got new batteries and checked at [facility], blood pressure about the same with both machines (systolic 294'T) Ok for patient to take an extra 1/2 lasix if systolic 654 or above Advised daughter

## 2014-04-12 NOTE — Telephone Encounter (Signed)
Pt c/o BP issue: STAT if pt c/o blurred vision, one-sided weakness or slurred speech  1. What are your last 5 BP readings? In general .. Running in the 140-150 and this morning it was 161/77.  2. Are you having any other symptoms (ex. Dizziness, headache, blurred vision, passed out)? NO   3. What is your BP issue? It is starting to creep up.

## 2014-04-12 NOTE — Telephone Encounter (Signed)
Pt c/o BP issue: STAT if pt c/o blurred vision, one-sided weakness or slurred speech  1. What are your last 5 BP readings? In the 150's this morning the BP was 163/77  2. Are you having any other symptoms (ex. Dizziness, headache, blurred vision, passed out)? No  3. What is your BP issue? He BP is creeping up again and would like to speak to the nurse about it//sr

## 2014-04-12 NOTE — Telephone Encounter (Signed)
Spoke with daughter and she is concerned about mothers blood pressure Has been going up over the last several weeks Systolic blood pressure was running in the 130's then 140's and over the last week 150's.  Today systolic blood pressure was 161 Will forward to  Dr. Mare Ferrari for review

## 2014-04-12 NOTE — Telephone Encounter (Signed)
Her blood pressure was normal earlier this week at another doctor's office.  She should continue to monitor closely and be sure that her machine at home is working properly.  If she continues to get high readings we should probably see her next week for an office visit.

## 2014-04-16 ENCOUNTER — Encounter: Payer: Self-pay | Admitting: Occupational Therapy

## 2014-04-16 ENCOUNTER — Ambulatory Visit: Payer: Medicare Other | Admitting: Physical Therapy

## 2014-04-16 ENCOUNTER — Ambulatory Visit: Payer: Medicare Other | Admitting: Occupational Therapy

## 2014-04-16 DIAGNOSIS — R531 Weakness: Secondary | ICD-10-CM

## 2014-04-16 DIAGNOSIS — R293 Abnormal posture: Secondary | ICD-10-CM

## 2014-04-16 DIAGNOSIS — R278 Other lack of coordination: Secondary | ICD-10-CM

## 2014-04-16 DIAGNOSIS — R279 Unspecified lack of coordination: Secondary | ICD-10-CM

## 2014-04-16 DIAGNOSIS — R258 Other abnormal involuntary movements: Secondary | ICD-10-CM

## 2014-04-16 DIAGNOSIS — R269 Unspecified abnormalities of gait and mobility: Secondary | ICD-10-CM

## 2014-04-16 NOTE — Therapy (Signed)
[facility] [address] [place], [place], [address] Phone: [phone]   Fax:  [phone]  Occupational Therapy Treatment  Patient Details  Name: Victoria Lindsey MRN: [id number] Date of Birth: [birth year] Referring Provider:  Elba Barman, MD  Encounter Date: 04/16/2014      OT End of Session - 04/16/14 1027    Visit Number 3   Number of Visits 17   Date for OT Re-Evaluation 06/01/14   Authorization Type Medicare   Authorization Time Period G code needed   Authorization - Visit Number 3   Authorization - Number of Visits 10   OT Start Time 1020   OT Stop Time 1102   OT Time Calculation (min) 42 min   Activity Tolerance Patient tolerated treatment well      Past Medical History  Diagnosis Date  . Hypertension   . Parkinson's disease   . Hyperlipidemia   . Dizziness   . Orthostatic hypotension   . Anemia   . Tremor   . Breast cancer     LEFT BREAST  . Kidney failure     stage 3  . Hypothyroidism   . Prediabetes   . TIA (transient ischemic attack)   . Hyperglycemia   . Left ventricular diastolic dysfunction     Past Surgical History  Procedure Laterality Date  . Cardiovascular stress test  06/2007    NORMAL  . Breast lumpectomy  2010  . Cholecystectomy  1994  . Tonsillectomy    . Nasal sinus surgery      submucous resection late 1950s  . Cataract extraction  2005,2006    Dr.Devonzo    There were no vitals taken for this visit.  Visit Diagnosis:  Bradykinesia  Decreased coordination  Weakness  Posture abnormality                     OT Education - 04/16/14 1348    Education provided Yes   Education Details Coordination HEP   Person(s) Educated Patient;Spouse   Methods Demonstration;Explanation;Verbal cues;Handout   Comprehension Verbalized understanding;Returned demonstration;Verbal cues required          OT Short Term Goals - 04/03/14 1735    OT SHORT  TERM GOAL #1   Title I with inital HEP.   Baseline ck 05/03/14   Time 4   Period Weeks   Status New   OT SHORT TERM GOAL #2   Title Pt / family will verbalize understanding of adapted strategies for ADLS/ IADLS.   Time 4   Period Weeks   Status New   OT SHORT TERM GOAL #3   Title Pt will demonstrate improved indpendence with dressing as evidenced by decreasing PPT #4 to 45 secs or less.   Time 4   Period Weeks   Status New   OT SHORT TERM GOAL #4   Title Pt will demonstrate ability to write 4 sentences with 100 % legibility and minimal decrease in letter size.   Time 4   Period Weeks   Status New           OT Long Term Goals - 04/03/14 1737    OT LONG TERM GOAL #1   Title Pt will perfom dressing with supervision/set up.   Baseline ck 06/01/14   Time 8   Period Weeks   Status New   OT LONG TERM GOAL #2   Title Pt will perform light home management/ snack prep with min A.  Time 8   Period Weeks   Status New   OT LONG TERM GOAL #3   Title Pt will demonstrate improved standing balance for ADLS as evidenced by increasing standing functional reach by 3 inches bilaterally.   Baseline RUE 7.5 inches, LUE 6 inches   Time 8   Period Weeks   Status New   OT LONG TERM GOAL #4   Title Pt will demonstrate increased ease with feeding as evidenced by decreasing PPT #2 to 10 secs or less.   Baseline 14.66 secs   Time 8   Period Weeks   Status New   OT LONG TERM GOAL #5   Title Pt/ family will verbalize understanding of compensatory strategies for cognition and ways to keep thinking skills sharp   Time 8   Period Weeks   Status New               Plan - 04/16/14 1350    Clinical Impression Statement Pt progressing towards goals and responds well to cues for big movements.   Plan review seated PWR! moves HEP   Family Member Consulted (husband present)        Problem List Patient Active Problem List   Diagnosis Date Noted  . Dizziness of unknown cause  02/21/2014  . Left ventricular diastolic dysfunction with preserved systolic function 22/29/7989  . Essential hypertension 12/30/2013  . Transaminitis 12/24/2013  . Palpitations 12/23/2013  . Respiratory insufficiency 12/22/2013  . Normocytic anemia 12/21/2013  . Thrombocytopenia 12/21/2013  . Abnormal ECG 12/15/2013  . Abnormal involuntary movement 12/15/2013  . Colorectal polyps 12/15/2013  . Chest pain 12/15/2013  . Arteriosclerosis of coronary artery 12/15/2013  . Chronic kidney disease (CKD), stage III (moderate) 12/15/2013  . Corn 12/15/2013  . Diabetes 12/15/2013  . Breathing difficult 12/15/2013  . Acid reflux 12/15/2013  . HLD (hyperlipidemia) 12/15/2013  . BP (high blood pressure) 12/15/2013  . FOM (frequency of micturition) 12/15/2013  . Breast cancer, female 12/15/2013  . Fungal infection of nail 12/15/2013  . Osteopenia 12/15/2013  . Raynaud's syndrome 12/15/2013  . Restless leg 12/15/2013  . Gougerout-Sjoegren syndrome 12/15/2013  . Change in blood platelet count 12/15/2013  . Infection of urinary tract 12/15/2013  . Lower urinary tract infection 10/12/2013  . Idiopathic Parkinson's disease 09/06/2013  . Bladder spasm 09/05/2013  . Urgency of micturation 09/05/2013  . Dyspnea 06/15/2012  . Hyperglycemia 12/27/2011  . Sjogren's syndrome 12/17/2010  . TIA (transient ischemic attack) 12/17/2010  . Benign hypertensive heart disease without heart failure 09/01/2010  . Parkinson's disease 09/01/2010  . Hypothyroidism 09/01/2010  . Hypercholesterolemia 09/01/2010  . Chronic anemia 09/01/2010    FREEMAN,ANGELA, OTR/L 04/16/2014, 1:51 PM  [facility] [address] [place], [place], [address] Phone: [phone]   Fax:  [phone]

## 2014-04-16 NOTE — Patient Instructions (Addendum)
Coordination Exercises  Perform the following exercises for 20 minutes 1 times per day. Perform with both hand(s). Perform using big movements.   Flipping Cards: Place deck of cards on the table. Flip cards over by opening your hand big to grasp and then turn your palm up big.  Deal cards: Hold 1/2 or whole deck in your hand. Use thumb to push card off top of deck with one big push.  Rotate ball with fingertips: Pick up with fingers/thumb and move as much as you can with each turn/movement (clockwise and counter-clockwise).  Pick up 5-10 coins one at a time and hold in palm. Then, move coins from palm to fingertips one at a time to stack.  Practice writing: Slow down, write big, and focus on forming each letter.  Fasten nuts/bolts or put on bottle caps: Turn as much/as big as you can with each turn.  Perform "Flicks"/hand stretches (PWR! Hands): Close hands then flick out your fingers with focus on opening hands, pulling wrists back, and extending elbows like you are pushing.

## 2014-04-16 NOTE — Therapy (Signed)
[facility] [address] [place], [place], [address] Phone: [phone]   Fax:  [phone]  Physical Therapy Treatment  Patient Details  Name: Victoria Lindsey MRN: [id number] Date of Birth: 17-Jan-1926 Referring Provider:  Elba Barman, MD  Encounter Date: 04/16/2014      PT End of Session - 04/16/14 1453    Visit Number 3   Number of Visits 17   Date for PT Re-Evaluation 06/03/14   PT Start Time 1105   PT Stop Time 1145   PT Time Calculation (min) 40 min   Equipment Utilized During Treatment Gait belt   Activity Tolerance Patient tolerated treatment well      Past Medical History  Diagnosis Date  . Hypertension   . Parkinson's disease   . Hyperlipidemia   . Dizziness   . Orthostatic hypotension   . Anemia   . Tremor   . Breast cancer     LEFT BREAST  . Kidney failure     stage 3  . Hypothyroidism   . Prediabetes   . TIA (transient ischemic attack)   . Hyperglycemia   . Left ventricular diastolic dysfunction     Past Surgical History  Procedure Laterality Date  . Cardiovascular stress test  06/2007    NORMAL  . Breast lumpectomy  2010  . Cholecystectomy  1994  . Tonsillectomy    . Nasal sinus surgery      submucous resection late 1950s  . Cataract extraction  2005,2006    Dr.Devonzo    There were no vitals taken for this visit.  Visit Diagnosis:  Bradykinesia  Decreased coordination  Weakness  Posture abnormality  Abnormality of gait      Subjective Assessment - 04/16/14 1123    Symptoms "I've stayed up on my feet."  Denies falls or changes since last visit.   Currently in Pain? No/denies                    [facility] Adult PT Treatment/Exercise - 04/16/14 0001    Transfers   Transfers Sit to Stand;Stand to Sit   Sit to Stand With armrests;With upper extremity assist;From chair/3-in-1;5: Supervision  cues for forward lean-repeated x 5 times for practice   Stand  to Sit 5: Supervision;With upper extremity assist;With armrests;To chair/3-in-1   Ambulation/Gait   Ambulation/Gait Yes   Ambulation/Gait Assistance 4: Min guard   Ambulation Distance (Feet) 120 Feet  twice and 40 x 3   Assistive device Rolling walker   Gait Pattern Decreased step length - right;Decreased step length - left;Narrow base of support   Ambulation Surface Level;Indoor   Gait Comments cues for increasing BOS and upright posture, head and neck   Knee/Hip Exercises: Standing   Heel Raises 15 reps   Knee Flexion Both;15 reps   Other Standing Knee Exercises bil toe raise, hip abduction, hip extension x 10   Other Standing Knee Exercises All exercises performed standing at walker           PWR [facility]) - 04/16/14 1317    PWR! exercises Moves in standing   PWR! Up 10   PWR! Rock 10   PWR Step 10   Comments needs UE assist of counter and cues for intensity         LS[facility]) - 04/16/14 1316    Step and Reach Forward 10 reps  UE support of counter and chair   Step and Reach Backward 10 reps  UE support of counter and chair     LSVT Step through forward to backward x 10 with UE support of counter and chair         PT Short Term Goals - 04/03/14 1138    PT SHORT TERM GOAL #1   Title Pt will perform HEP with supervision for improved strength, balance, and gait. (Target date 04/02/14)   Time 4   Period Weeks   Status New   PT SHORT TERM GOAL #2   Title Pt will improve Berg Balance score to at least 25/56 for decreased fall risk.   Time 4   Period Weeks   Status New   PT SHORT TERM GOAL #3   Title Pt will improve TUG score to less than or equal to 30 seconds for improved ADL participation in home.   Time 4   Period Weeks   Status New   PT SHORT TERM GOAL #4   Title Pt will perform at least 8 of 10 reps of sit<>stand with minimal UE support from <18 inch surfaces, for improved transfer efficiency and safety.   Time 4   Period Weeks   Status New            PT Long Term Goals - 04/03/14 1141    PT LONG TERM GOAL #1   Title Pt will verbalize understanding of fall prevention techniques within home environment. (Target date 06/03/14)   Time 8   Period Weeks   Status New   PT LONG TERM GOAL #2   Title Pt will improve Berg Balance score to at least 35/56 for decreased fall risk.   Time 8   Period Weeks   Status New   PT LONG TERM GOAL #3   Title Pt will imrpove TUG score to less than or equal to 20 seconds for decreased fall risk.   Time 8   Period Weeks   Status New   PT LONG TERM GOAL #4   Title Pt will improve gait velocity to at least 1.8 ft/sec for improved gait efficiency and safety.   Time 8   Period Weeks   Status New   PT LONG TERM GOAL #5   Title Pt will verbalize continued community fitness plans upon D/C from PT.   Time 8   Period Weeks   Status New               Plan - 04/16/14 1454    Clinical Impression Statement Pt continues to feel unsteady with gait and standing.  Continues with decrease BOS.   Pt will benefit from skilled therapeutic intervention in order to improve on the following deficits Abnormal gait;Decreased activity tolerance;Decreased balance;Decreased mobility;Decreased strength;Difficulty walking;Postural dysfunction   Rehab Potential Good   PT Frequency 2x / week   PT Duration 8 weeks   PT Treatment/Interventions ADLs/Self Care Home Management;Gait training;Stair training;Functional mobility training;Neuromuscular re-education;Balance training;Therapeutic exercise;Therapeutic activities;Patient/family education   PT Next Visit Plan Standing balance activities   Consulted and Agree with Plan of Care Patient;Family member/caregiver   Family Member Consulted husband        Problem List Patient Active Problem List   Diagnosis Date Noted  . Dizziness of unknown cause 02/21/2014  . Left ventricular diastolic dysfunction with preserved systolic function 24/40/1027  . Essential hypertension  12/30/2013  . Transaminitis 12/24/2013  . Palpitations 12/23/2013  . Respiratory insufficiency 12/22/2013  . Normocytic anemia 12/21/2013  . Thrombocytopenia 12/21/2013  . Abnormal ECG 12/15/2013  . Abnormal  involuntary movement 12/15/2013  . Colorectal polyps 12/15/2013  . Chest pain 12/15/2013  . Arteriosclerosis of coronary artery 12/15/2013  . Chronic kidney disease (CKD), stage III (moderate) 12/15/2013  . Corn 12/15/2013  . Diabetes 12/15/2013  . Breathing difficult 12/15/2013  . Acid reflux 12/15/2013  . HLD (hyperlipidemia) 12/15/2013  . BP (high blood pressure) 12/15/2013  . FOM (frequency of micturition) 12/15/2013  . Breast cancer, female 12/15/2013  . Fungal infection of nail 12/15/2013  . Osteopenia 12/15/2013  . Raynaud's syndrome 12/15/2013  . Restless leg 12/15/2013  . Gougerout-Sjoegren syndrome 12/15/2013  . Change in blood platelet count 12/15/2013  . Infection of urinary tract 12/15/2013  . Lower urinary tract infection 10/12/2013  . Idiopathic Parkinson's disease 09/06/2013  . Bladder spasm 09/05/2013  . Urgency of micturation 09/05/2013  . Dyspnea 06/15/2012  . Hyperglycemia 12/27/2011  . Sjogren's syndrome 12/17/2010  . TIA (transient ischemic attack) 12/17/2010  . Benign hypertensive heart disease without heart failure 09/01/2010  . Parkinson's disease 09/01/2010  . Hypothyroidism 09/01/2010  . Hypercholesterolemia 09/01/2010  . Chronic anemia 09/01/2010    Narda Bonds 04/16/2014, 2:57 PM  [facility] [address] [place], [place], [address] Phone: [phone]   Fax:  [place], [place] 04/16/2014 2:57 PM Phone: [phone] Fax: [phone]

## 2014-04-19 ENCOUNTER — Ambulatory Visit: Payer: Medicare Other | Admitting: Occupational Therapy

## 2014-04-19 ENCOUNTER — Ambulatory Visit: Payer: Medicare Other

## 2014-04-19 DIAGNOSIS — R278 Other lack of coordination: Secondary | ICD-10-CM

## 2014-04-19 DIAGNOSIS — R279 Unspecified lack of coordination: Secondary | ICD-10-CM

## 2014-04-19 DIAGNOSIS — R531 Weakness: Secondary | ICD-10-CM

## 2014-04-19 DIAGNOSIS — R258 Other abnormal involuntary movements: Secondary | ICD-10-CM

## 2014-04-19 DIAGNOSIS — R269 Unspecified abnormalities of gait and mobility: Secondary | ICD-10-CM

## 2014-04-19 DIAGNOSIS — R293 Abnormal posture: Secondary | ICD-10-CM

## 2014-04-19 NOTE — Therapy (Signed)
[facility] [address] [place], [place], [address] Phone: [phone]   Fax:  [phone]  Occupational Therapy Treatment  Patient Details  Name: Victoria Lindsey MRN: [id number] Date of Birth: Apr 27, 1925 Referring Provider:  Elba Barman, MD  Encounter Date: 04/19/2014      OT End of Session - 04/19/14 0945    Visit Number 4   Number of Visits 17   Authorization Type Medicare   Authorization Time Period G code needed   Authorization - Visit Number 4   Authorization - Number of Visits 10   OT Start Time 0935   OT Stop Time 4235   OT Time Calculation (min) 40 min   Activity Tolerance Patient tolerated treatment well      Past Medical History  Diagnosis Date  . Hypertension   . Parkinson's disease   . Hyperlipidemia   . Dizziness   . Orthostatic hypotension   . Anemia   . Tremor   . Breast cancer     LEFT BREAST  . Kidney failure     stage 3  . Hypothyroidism   . Prediabetes   . TIA (transient ischemic attack)   . Hyperglycemia   . Left ventricular diastolic dysfunction     Past Surgical History  Procedure Laterality Date  . Cardiovascular stress test  06/2007    NORMAL  . Breast lumpectomy  2010  . Cholecystectomy  1994  . Tonsillectomy    . Nasal sinus surgery      submucous resection late 1950s  . Cataract extraction  2005,2006    Dr.Devonzo    There were no vitals taken for this visit.  Visit Diagnosis:  Bradykinesia  Decreased coordination  Weakness      Subjective Assessment - 04/19/14 0944    Currently in Pain? Yes                 OT Treatments/Exercises (OP) - 04/19/14 0001    Fine Motor Coordination   Small Pegboard copying design with RUE for increased FM coordination  min v.c. for design/ performance   Neurological Re-education Exercises   Reciprocal Movements UBE x 5 mins level 1 for conditioning, min v.c. for speed  pt maintained 28-34 RPM            PWR ([facility]) - 04/19/14 0949    PWR! Up 20 reps, min v.c./ demo   PWR! Rock 20 reps, min v.c./ demo   PWR! Twist 20 reps, min v.c./ demo   PWR! Step 20 reps min vc./ demo, single step to each side               OT Short Term Goals - 04/03/14 1735    OT SHORT TERM GOAL #1   Title I with inital HEP.   Baseline ck 05/03/14   Time 4   Period Weeks   Status New   OT SHORT TERM GOAL #2   Title Pt / family will verbalize understanding of adapted strategies for ADLS/ IADLS.   Time 4   Period Weeks   Status New   OT SHORT TERM GOAL #3   Title Pt will demonstrate improved indpendence with dressing as evidenced by decreasing PPT #4 to 45 secs or less.   Time 4   Period Weeks   Status New   OT SHORT TERM GOAL #4   Title Pt will demonstrate ability to write 4 sentences with 100 % legibility and minimal decrease in letter  size.   Time 4   Period Weeks   Status New           OT Long Term Goals - 04/03/14 1737    OT LONG TERM GOAL #1   Title Pt will perfom dressing with supervision/set up.   Baseline ck 06/01/14   Time 8   Period Weeks   Status New   OT LONG TERM GOAL #2   Title Pt will perform light home management/ snack prep with min A.   Time 8   Period Weeks   Status New   OT LONG TERM GOAL #3   Title Pt will demonstrate improved standing balance for ADLS as evidenced by increasing standing functional reach by 3 inches bilaterally.   Baseline RUE 7.5 inches, LUE 6 inches   Time 8   Period Weeks   Status New   OT LONG TERM GOAL #4   Title Pt will demonstrate increased ease with feeding as evidenced by decreasing PPT #2 to 10 secs or less.   Baseline 14.66 secs   Time 8   Period Weeks   Status New   OT LONG TERM GOAL #5   Title Pt/ family will verbalize understanding of compensatory strategies for cognition and ways to keep thinking skills sharp   Time 8   Period Weeks   Status New               Plan - 04/19/14 1009    Clinical  Impression Statement Pt is progressing towards goals with    Rehab Potential Good   Clinical Impairments Affecting Rehab Potential decreased strength, decreased coordination, bradykinesia   OT Frequency 2x / week   OT Duration 8 weeks   OT Treatment/Interventions Self-care/ADL training;Fluidtherapy;DME and/or AE instruction;Splinting;Patient/family education;Balance training;Therapeutic exercises;Ultrasound;Therapeutic exercise;Therapeutic activities;Cognitive remediation/compensation;Passive range of motion;Functional Mobility Training;Neuromuscular education;Iontophoresis;Parrafin;Energy conservation;Manual Therapy;Visual/perceptual remediation/compensation   Plan review fine motor coordination HEP, big movements with ADLs.   OT Home Exercise Plan PWR seated, coordination   Consulted and Agree with Plan of Care Patient   Family Member Consulted (husband present)        Problem List Patient Active Problem List   Diagnosis Date Noted  . Dizziness of unknown cause 02/21/2014  . Left ventricular diastolic dysfunction with preserved systolic function 29/47/6546  . Essential hypertension 12/30/2013  . Transaminitis 12/24/2013  . Palpitations 12/23/2013  . Respiratory insufficiency 12/22/2013  . Normocytic anemia 12/21/2013  . Thrombocytopenia 12/21/2013  . Abnormal ECG 12/15/2013  . Abnormal involuntary movement 12/15/2013  . Colorectal polyps 12/15/2013  . Chest pain 12/15/2013  . Arteriosclerosis of coronary artery 12/15/2013  . Chronic kidney disease (CKD), stage III (moderate) 12/15/2013  . Corn 12/15/2013  . Diabetes 12/15/2013  . Breathing difficult 12/15/2013  . Acid reflux 12/15/2013  . HLD (hyperlipidemia) 12/15/2013  . BP (high blood pressure) 12/15/2013  . FOM (frequency of micturition) 12/15/2013  . Breast cancer, female 12/15/2013  . Fungal infection of nail 12/15/2013  . Osteopenia 12/15/2013  . Raynaud's syndrome 12/15/2013  . Restless leg 12/15/2013  .  Gougerout-Sjoegren syndrome 12/15/2013  . Change in blood platelet count 12/15/2013  . Infection of urinary tract 12/15/2013  . Lower urinary tract infection 10/12/2013  . Idiopathic Parkinson's disease 09/06/2013  . Bladder spasm 09/05/2013  . Urgency of micturation 09/05/2013  . Dyspnea 06/15/2012  . Hyperglycemia 12/27/2011  . Sjogren's syndrome 12/17/2010  . TIA (transient ischemic attack) 12/17/2010  . Benign hypertensive heart disease without heart failure 09/01/2010  . Parkinson's disease  09/01/2010  . Hypothyroidism 09/01/2010  . Hypercholesterolemia 09/01/2010  . Chronic anemia 09/01/2010    Leldon Steege 04/19/2014, 10:14 AM Theone Murdoch, OTR/L [phone] Phone: [phone] 10:14 AM 04/19/2014 [facility] [address] [place], [place], [address] Phone: [phone]   Fax:  [phone]

## 2014-04-19 NOTE — Therapy (Signed)
[facility] [address] [place], [place], [address] Phone: [phone]   Fax:  [phone]  Physical Therapy Treatment  Patient Details  Name: Victoria Lindsey MRN: [id number] Date of Birth: 09/01/1925 Referring Provider:  Elba Barman, MD  Encounter Date: 04/19/2014      PT End of Session - 04/19/14 1122    Visit Number 4   Number of Visits 17   Date for PT Re-Evaluation 06/03/14   PT Start Time 1021   PT Stop Time 1101   PT Time Calculation (min) 40 min      Past Medical History  Diagnosis Date  . Hypertension   . Parkinson's disease   . Hyperlipidemia   . Dizziness   . Orthostatic hypotension   . Anemia   . Tremor   . Breast cancer     LEFT BREAST  . Kidney failure     stage 3  . Hypothyroidism   . Prediabetes   . TIA (transient ischemic attack)   . Hyperglycemia   . Left ventricular diastolic dysfunction     Past Surgical History  Procedure Laterality Date  . Cardiovascular stress test  06/2007    NORMAL  . Breast lumpectomy  2010  . Cholecystectomy  1994  . Tonsillectomy    . Nasal sinus surgery      submucous resection late 1950s  . Cataract extraction  2005,2006    Dr.Devonzo    There were no vitals taken for this visit.  Visit Diagnosis:  Bradykinesia  Decreased coordination  Weakness  Posture abnormality  Abnormality of gait   Theract: Chair management at table. Pt practiced sustained squat with pulling chair in and out at table with verbal cues for sequencing and form. Performed multiple trials and progressed from MIN A to close supervision for approaching a chair, pulling chair out, squatting in front of chair and pulling it in, then reversing the activity to leave the table.  Sit to stand training 8x with verbal cues and demonstration for anterior weight shift and maintaining weight through the toes to prevent retropulsion. Pt progressed from MIN A to supervision  during session.  Also performed seated anterior/posterior pelvic tilts with tactile cues for improved posture  Standing balance: Weight shifting with overhead reach to target 10x each side, then with crossover midline overhead reach 10x each side with single UE support.  Anterior/posterior limits of stability with MIN A and BUE support then with single UE support, then with supervision and CGA. Also performed sustained holds on tip toes with CGA.                 PWR [facility]) - 04/19/14 2595    PWR! Up 20 reps, min v.c./ dmonstration   PWR! Rock 20 reps, min v.c./ demo   PWR! Twist 20 reps, min v.c./ demo   PWR! Step 20 reps min vc./ demo, single step to each side               PT Short Term Goals - 04/03/14 1138    PT SHORT TERM GOAL #1   Title Pt will perform HEP with supervision for improved strength, balance, and gait. (Target date 04/02/14)   Time 4   Period Weeks   Status New   PT SHORT TERM GOAL #2   Title Pt will improve Berg Balance score to at least 25/56 for decreased fall risk.   Time 4   Period Weeks   Status New  PT SHORT TERM GOAL #3   Title Pt will improve TUG score to less than or equal to 30 seconds for improved ADL participation in home.   Time 4   Period Weeks   Status New   PT SHORT TERM GOAL #4   Title Pt will perform at least 8 of 10 reps of sit<>stand with minimal UE support from <18 inch surfaces, for improved transfer efficiency and safety.   Time 4   Period Weeks   Status New           PT Long Term Goals - 04/03/14 1141    PT LONG TERM GOAL #1   Title Pt will verbalize understanding of fall prevention techniques within home environment. (Target date 06/03/14)   Time 8   Period Weeks   Status New   PT LONG TERM GOAL #2   Title Pt will improve Berg Balance score to at least 35/56 for decreased fall risk.   Time 8   Period Weeks   Status New   PT LONG TERM GOAL #3   Title Pt will imrpove TUG score to less than or equal to  20 seconds for decreased fall risk.   Time 8   Period Weeks   Status New   PT LONG TERM GOAL #4   Title Pt will improve gait velocity to at least 1.8 ft/sec for improved gait efficiency and safety.   Time 8   Period Weeks   Status New   PT LONG TERM GOAL #5   Title Pt will verbalize continued community fitness plans upon D/C from PT.   Time 8   Period Weeks   Status New               Plan - 04/19/14 1123    Clinical Impression Statement Pt demonstrates frequent retropulsion. Emphasis of session today was on decreasing this and promoting improved anterior weight shift to allow for better and safer functional mobility.   PT Next Visit Plan continue with standing balance, perform activities on ramp with downward slope to promote forward weight shift, more chair management at table        Problem List Patient Active Problem List   Diagnosis Date Noted  . Dizziness of unknown cause 02/21/2014  . Left ventricular diastolic dysfunction with preserved systolic function 46/56/8127  . Essential hypertension 12/30/2013  . Transaminitis 12/24/2013  . Palpitations 12/23/2013  . Respiratory insufficiency 12/22/2013  . Normocytic anemia 12/21/2013  . Thrombocytopenia 12/21/2013  . Abnormal ECG 12/15/2013  . Abnormal involuntary movement 12/15/2013  . Colorectal polyps 12/15/2013  . Chest pain 12/15/2013  . Arteriosclerosis of coronary artery 12/15/2013  . Chronic kidney disease (CKD), stage III (moderate) 12/15/2013  . Corn 12/15/2013  . Diabetes 12/15/2013  . Breathing difficult 12/15/2013  . Acid reflux 12/15/2013  . HLD (hyperlipidemia) 12/15/2013  . BP (high blood pressure) 12/15/2013  . FOM (frequency of micturition) 12/15/2013  . Breast cancer, female 12/15/2013  . Fungal infection of nail 12/15/2013  . Osteopenia 12/15/2013  . Raynaud's syndrome 12/15/2013  . Restless leg 12/15/2013  . Gougerout-Sjoegren syndrome 12/15/2013  . Change in blood platelet count  12/15/2013  . Infection of urinary tract 12/15/2013  . Lower urinary tract infection 10/12/2013  . Idiopathic Parkinson's disease 09/06/2013  . Bladder spasm 09/05/2013  . Urgency of micturation 09/05/2013  . Dyspnea 06/15/2012  . Hyperglycemia 12/27/2011  . Sjogren's syndrome 12/17/2010  . TIA (transient ischemic attack) 12/17/2010  . Benign hypertensive heart disease  without heart failure 09/01/2010  . Parkinson's disease 09/01/2010  . Hypothyroidism 09/01/2010  . Hypercholesterolemia 09/01/2010  . Chronic anemia 09/01/2010    Delrae Sawyers D 04/19/2014, 11:58 AM  [facility] [address] [place], [place], [address] Phone: [phone]   Fax:  [phone]

## 2014-04-23 ENCOUNTER — Ambulatory Visit: Payer: Medicare Other | Admitting: Occupational Therapy

## 2014-04-23 ENCOUNTER — Ambulatory Visit: Payer: Medicare Other | Admitting: Physical Therapy

## 2014-04-23 ENCOUNTER — Encounter: Payer: Self-pay | Admitting: Physical Therapy

## 2014-04-23 DIAGNOSIS — R279 Unspecified lack of coordination: Secondary | ICD-10-CM

## 2014-04-23 DIAGNOSIS — R531 Weakness: Secondary | ICD-10-CM

## 2014-04-23 DIAGNOSIS — R258 Other abnormal involuntary movements: Secondary | ICD-10-CM

## 2014-04-23 DIAGNOSIS — R278 Other lack of coordination: Secondary | ICD-10-CM

## 2014-04-23 DIAGNOSIS — R269 Unspecified abnormalities of gait and mobility: Secondary | ICD-10-CM

## 2014-04-23 DIAGNOSIS — R293 Abnormal posture: Secondary | ICD-10-CM

## 2014-04-23 NOTE — Therapy (Signed)
[facility] [address] [place], [place], [address] Phone: [phone]   Fax:  [phone]  Occupational Therapy Treatment  Patient Details  Name: Victoria Lindsey MRN: [id number] Date of Birth: [birth year] Referring Provider:  Elba Barman, MD  Encounter Date: 04/23/2014      OT End of Session - 04/23/14 1602    Visit Number 5   Number of Visits 17   Date for OT Re-Evaluation 06/01/14   Authorization Type Medicare   Authorization Time Period G code needed   Authorization - Visit Number 5   Authorization - Number of Visits 10   OT Start Time 1536   OT Stop Time 1615   OT Time Calculation (min) 39 min   Activity Tolerance Patient tolerated treatment well   Behavior During Therapy [facility] for tasks assessed/performed      Past Medical History  Diagnosis Date  . Hypertension   . Parkinson's disease   . Hyperlipidemia   . Dizziness   . Orthostatic hypotension   . Anemia   . Tremor   . Breast cancer     LEFT BREAST  . Kidney failure     stage 3  . Hypothyroidism   . Prediabetes   . TIA (transient ischemic attack)   . Hyperglycemia   . Left ventricular diastolic dysfunction     Past Surgical History  Procedure Laterality Date  . Cardiovascular stress test  06/2007    NORMAL  . Breast lumpectomy  2010  . Cholecystectomy  1994  . Tonsillectomy    . Nasal sinus surgery      submucous resection late 1950s  . Cataract extraction  2005,2006    Dr.Devonzo    There were no vitals taken for this visit.  Visit Diagnosis:  Bradykinesia  Decreased coordination  Weakness  Abnormality of gait      Subjective Assessment - 04/23/14 1541    Currently in Pain? Yes   Pain Score 1    Pain Orientation Right   Pain Type Acute pain                 OT Treatments/Exercises (OP) - 04/23/14 0001    Fine Motor Coordination   Small Pegboard copying design with RUE for increased FM coordination on  vertical surface at 75-90 shoulder flexion, min v.c. For design   Neurological Re-education Exercises   Other Exercises 1 Forward pass for timing and increased shoulder abduct /adduct min v.c / difficulty           PWR [facility]) - 04/23/14 1610    PWR! exercises Moves in supine   PWR! Up 10 reps min v.c.   PWR! Rock 20 reps, min v.c. and demonstration   PWR! Twist 20 reps, min v.c.   PWR! Step 10 reps min v.c., following by bridging x 10 reps               OT Short Term Goals - 04/03/14 1735    OT SHORT TERM GOAL #1   Title I with inital HEP.   Baseline ck 05/03/14   Time 4   Period Weeks   Status New   OT SHORT TERM GOAL #2   Title Pt / family will verbalize understanding of adapted strategies for ADLS/ IADLS.   Time 4   Period Weeks   Status New   OT SHORT TERM GOAL #3   Title Pt will demonstrate improved indpendence with dressing as evidenced by decreasing PPT #  4 to 45 secs or less.   Time 4   Period Weeks   Status New   OT SHORT TERM GOAL #4   Title Pt will demonstrate ability to write 4 sentences with 100 % legibility and minimal decrease in letter size.   Time 4   Period Weeks   Status New           OT Long Term Goals - 04/03/14 1737    OT LONG TERM GOAL #1   Title Pt will perfom dressing with supervision/set up.   Baseline ck 06/01/14   Time 8   Period Weeks   Status New   OT LONG TERM GOAL #2   Title Pt will perform light home management/ snack prep with min A.   Time 8   Period Weeks   Status New   OT LONG TERM GOAL #3   Title Pt will demonstrate improved standing balance for ADLS as evidenced by increasing standing functional reach by 3 inches bilaterally.   Baseline RUE 7.5 inches, LUE 6 inches   Time 8   Period Weeks   Status New   OT LONG TERM GOAL #4   Title Pt will demonstrate increased ease with feeding as evidenced by decreasing PPT #2 to 10 secs or less.   Baseline 14.66 secs   Time 8   Period Weeks   Status New   OT LONG TERM  GOAL #5   Title Pt/ family will verbalize understanding of compensatory strategies for cognition and ways to keep thinking skills sharp   Time 8   Period Weeks   Status New               Plan - 04/23/14 1603    Clinical Impression Statement Pt is progressing towards goals. Pt's husband reports she is more indpendent with daily activities now   Rehab Potential Good   Clinical Impairments Affecting Rehab Potential decreased strength, decreased coordination, bradykinesia   OT Frequency 2x / week   OT Duration 8 weeks   OT Treatment/Interventions Self-care/ADL training;Fluidtherapy;DME and/or AE instruction;Splinting;Patient/family education;Balance training;Therapeutic exercises;Ultrasound;Therapeutic exercise;Therapeutic activities;Cognitive remediation/compensation;Passive range of motion;Functional Mobility Training;Neuromuscular education;Iontophoresis;Parrafin;Energy conservation;Manual Therapy;Visual/perceptual remediation/compensation   Plan review fine motor coordnation HEP, ADLS/ simulated ADLs with bag exercises   OT Home Exercise Plan PWR seated, coordination   Family Member Consulted (husband present)        Problem List Patient Active Problem List   Diagnosis Date Noted  . Dizziness of unknown cause 02/21/2014  . Left ventricular diastolic dysfunction with preserved systolic function 82/99/3716  . Essential hypertension 12/30/2013  . Transaminitis 12/24/2013  . Palpitations 12/23/2013  . Respiratory insufficiency 12/22/2013  . Normocytic anemia 12/21/2013  . Thrombocytopenia 12/21/2013  . Abnormal ECG 12/15/2013  . Abnormal involuntary movement 12/15/2013  . Colorectal polyps 12/15/2013  . Chest pain 12/15/2013  . Arteriosclerosis of coronary artery 12/15/2013  . Chronic kidney disease (CKD), stage III (moderate) 12/15/2013  . Corn 12/15/2013  . Diabetes 12/15/2013  . Breathing difficult 12/15/2013  . Acid reflux 12/15/2013  . HLD (hyperlipidemia)  12/15/2013  . BP (high blood pressure) 12/15/2013  . FOM (frequency of micturition) 12/15/2013  . Breast cancer, female 12/15/2013  . Fungal infection of nail 12/15/2013  . Osteopenia 12/15/2013  . Raynaud's syndrome 12/15/2013  . Restless leg 12/15/2013  . Gougerout-Sjoegren syndrome 12/15/2013  . Change in blood platelet count 12/15/2013  . Infection of urinary tract 12/15/2013  . Lower urinary tract infection 10/12/2013  . Idiopathic Parkinson's disease  09/06/2013  . Bladder spasm 09/05/2013  . Urgency of micturation 09/05/2013  . Dyspnea 06/15/2012  . Hyperglycemia 12/27/2011  . Sjogren's syndrome 12/17/2010  . TIA (transient ischemic attack) 12/17/2010  . Benign hypertensive heart disease without heart failure 09/01/2010  . Parkinson's disease 09/01/2010  . Hypothyroidism 09/01/2010  . Hypercholesterolemia 09/01/2010  . Chronic anemia 09/01/2010    RINE,KATHRYN 04/23/2014, 4:36 PM Theone Murdoch, OTR/L [phone] Phone: [phone] 4:36 PM 02/16/2016[facility] [address] [place], [place], [address] Phone: [phone]   Fax:  [phone]

## 2014-04-24 ENCOUNTER — Telehealth: Payer: Self-pay

## 2014-04-24 NOTE — Therapy (Signed)
Victoria Lindsey [address] [place], [place], [address] Phone: [phone]   Fax:  [phone]  Physical Therapy Treatment  Patient Details  Name: Victoria Lindsey MRN: [id number] Date of Birth: Jul 07, 1925 Referring Provider:  Elba Barman, MD  Encounter Date: 04/23/2014      PT End of Session - 04/23/14 1506    Visit Number 5   Number of Visits 17   Date for PT Re-Evaluation 06/03/14   PT Start Time 8768   PT Stop Time 1445   PT Time Calculation (min) 40 min   Equipment Utilized During Treatment Gait belt   Activity Tolerance Patient tolerated treatment well   Behavior During Therapy [facility] for tasks assessed/performed      Past Medical History  Diagnosis Date  . Hypertension   . Parkinson's disease   . Hyperlipidemia   . Dizziness   . Orthostatic hypotension   . Anemia   . Tremor   . Breast cancer     LEFT BREAST  . Kidney failure     stage 3  . Hypothyroidism   . Prediabetes   . TIA (transient ischemic attack)   . Hyperglycemia   . Left ventricular diastolic dysfunction     Past Surgical History  Procedure Laterality Date  . Cardiovascular stress test  06/2007    NORMAL  . Breast lumpectomy  2010  . Cholecystectomy  1994  . Tonsillectomy    . Nasal sinus surgery      submucous resection late 1950s  . Cataract extraction  2005,2006    Dr.Devonzo    There were no vitals taken for this visit.  Visit Diagnosis:  Bradykinesia  Decreased coordination  Weakness  Posture abnormality  Abnormality of gait      Subjective Assessment - 04/23/14 1430    Symptoms Denies falls or changes.  Husband reports he can see a significant difference in her mobility.   Currently in Pain? No/denies                    [facility] Adult PT Treatment/Exercise - 04/23/14 1437    Transfers   Transfers Sit to Stand;Stand to Sit   Sit to Stand With upper extremity assist;5: Supervision;4: Min  assist;From chair/3-in-1;Other/comment  from various heights and stool to promote forward flexion   Sit to Stand Details (indicate cue type and reason) repeated multiple times and with hands on knees for forward flexion.  sitting on mat pushing green therapy ball forward to promote forward lean.  Chair push ups x 10.   Stand to Sit 5: Supervision;4: Min assist  needs cues frequently to maintain balance forward   Stand to Sit Details cues to control descent   Ambulation/Gait   Ramp Other (comment)  walking up/down ramp and standing on ramp for balance min A   Transfers   Sit to Stand Details Other (comment)  scooting chair away/to table   Stand to Sit Details (indicate cue type and reason) --  performs squat while scooting chair up to table with min A     Worked on scooting chair up to table and away with squat to scoot.  Pt need assist to control descent.  Worked on sit to stand from multiple surfaces with multiple repetitions to work on controlling descent and forward lean.  Balance activities on ramp.         PT Education - 04/23/14 1506    Education provided Yes   Education Details  Forward lean with transfers   Person(s) Educated Patient;Spouse   Methods Explanation;Demonstration   Comprehension Verbalized understanding          PT Short Term Goals - 04/03/14 1138    PT SHORT TERM GOAL #1   Title Pt will perform HEP with supervision for improved strength, balance, and gait. (Target date 04/02/14)   Time 4   Period Weeks   Status New   PT SHORT TERM GOAL #2   Title Pt will improve Berg Balance score to at least 25/56 for decreased fall risk.   Time 4   Period Weeks   Status New   PT SHORT TERM GOAL #3   Title Pt will improve TUG score to less than or equal to 30 seconds for improved ADL participation in home.   Time 4   Period Weeks   Status New   PT SHORT TERM GOAL #4   Title Pt will perform at least 8 of 10 reps of sit<>stand with minimal UE support from <18 inch  surfaces, for improved transfer efficiency and safety.   Time 4   Period Weeks   Status New           PT Long Term Goals - 04/03/14 1141    PT LONG TERM GOAL #1   Title Pt will verbalize understanding of fall prevention techniques within home environment. (Target date 06/03/14)   Time 8   Period Weeks   Status New   PT LONG TERM GOAL #2   Title Pt will improve Berg Balance score to at least 35/56 for decreased fall risk.   Time 8   Period Weeks   Status New   PT LONG TERM GOAL #3   Title Pt will imrpove TUG score to less than or equal to 20 seconds for decreased fall risk.   Time 8   Period Weeks   Status New   PT LONG TERM GOAL #4   Title Pt will improve gait velocity to at least 1.8 ft/sec for improved gait efficiency and safety.   Time 8   Period Weeks   Status New   PT LONG TERM GOAL #5   Title Pt will verbalize continued community fitness plans upon D/C from PT.   Time 8   Period Weeks   Status New               Plan - 04/23/14 1507    Clinical Impression Statement Pt continues with retropulsion but responds well with verbal cues and minimal tactile cues.  Husband very supportive and pleased with patients progress.   Pt will benefit from skilled therapeutic intervention in order to improve on the following deficits Abnormal gait;Decreased activity tolerance;Decreased balance;Decreased mobility;Decreased strength;Difficulty walking;Postural dysfunction   Rehab Potential Good   PT Frequency 2x / week   PT Duration 8 weeks   PT Treatment/Interventions ADLs/Self Care Home Management;Gait training;Stair training;Functional mobility training;Neuromuscular re-education;Balance training;Therapeutic exercise;Therapeutic activities;Patient/family education   PT Next Visit Plan Continue with transfers, standing balance.   Consulted and Agree with Plan of Care Patient;Family member/caregiver   Family Member Consulted husband        Problem List Patient Active  Problem List   Diagnosis Date Noted  . Dizziness of unknown cause 02/21/2014  . Left ventricular diastolic dysfunction with preserved systolic function 32/35/5732  . Essential hypertension 12/30/2013  . Transaminitis 12/24/2013  . Palpitations 12/23/2013  . Respiratory insufficiency 12/22/2013  . Normocytic anemia 12/21/2013  . Thrombocytopenia 12/21/2013  . Abnormal  ECG 12/15/2013  . Abnormal involuntary movement 12/15/2013  . Colorectal polyps 12/15/2013  . Chest pain 12/15/2013  . Arteriosclerosis of coronary artery 12/15/2013  . Chronic kidney disease (CKD), stage III (moderate) 12/15/2013  . Corn 12/15/2013  . Diabetes 12/15/2013  . Breathing difficult 12/15/2013  . Acid reflux 12/15/2013  . HLD (hyperlipidemia) 12/15/2013  . BP (high blood pressure) 12/15/2013  . FOM (frequency of micturition) 12/15/2013  . Breast cancer, female 12/15/2013  . Fungal infection of nail 12/15/2013  . Osteopenia 12/15/2013  . Raynaud's syndrome 12/15/2013  . Restless leg 12/15/2013  . Gougerout-Sjoegren syndrome 12/15/2013  . Change in blood platelet count 12/15/2013  . Infection of urinary tract 12/15/2013  . Lower urinary tract infection 10/12/2013  . Idiopathic Parkinson's disease 09/06/2013  . Bladder spasm 09/05/2013  . Urgency of micturation 09/05/2013  . Dyspnea 06/15/2012  . Hyperglycemia 12/27/2011  . Sjogren's syndrome 12/17/2010  . TIA (transient ischemic attack) 12/17/2010  . Benign hypertensive heart disease without heart failure 09/01/2010  . Parkinson's disease 09/01/2010  . Hypothyroidism 09/01/2010  . Hypercholesterolemia 09/01/2010  . Chronic anemia 09/01/2010    Narda Bonds 04/24/2014, 8:38 AM  [facility] [address] [place], [place], [address] Phone: [phone]   Fax:  [place], [place] 04/24/2014 8:38  AM Phone: [phone] Fax: [phone]

## 2014-04-24 NOTE — Telephone Encounter (Signed)
Patient's daughter called to discuss orders to be sent to [facility] for Speech Therapy. Patient is already receiving outpatient physical therapy and occupational therapy. [facility] has tried to contact our office to get orders and diagnosis.  I called [facility] and spoke with Angie, we need to write orders with diagnosis and fax to [phone]

## 2014-04-25 ENCOUNTER — Ambulatory Visit: Payer: Medicare Other | Admitting: Physical Therapy

## 2014-04-25 ENCOUNTER — Ambulatory Visit: Payer: Medicare Other | Admitting: Occupational Therapy

## 2014-04-25 DIAGNOSIS — R278 Other lack of coordination: Secondary | ICD-10-CM

## 2014-04-25 DIAGNOSIS — R531 Weakness: Secondary | ICD-10-CM

## 2014-04-25 DIAGNOSIS — R258 Other abnormal involuntary movements: Secondary | ICD-10-CM

## 2014-04-25 DIAGNOSIS — R293 Abnormal posture: Secondary | ICD-10-CM

## 2014-04-25 DIAGNOSIS — R279 Unspecified lack of coordination: Secondary | ICD-10-CM | POA: Diagnosis not present

## 2014-04-25 MED ORDER — AMBULATORY NON FORMULARY MEDICATION: Status: DC

## 2014-04-25 NOTE — Therapy (Signed)
[facility] [address] [place], [place], [address] Phone: [phone]   Fax:  [phone]  Physical Therapy Treatment  Patient Details  Name: Victoria Lindsey MRN: [id number] Date of Birth: Apr 10, 1925 Referring Provider:  [name], DO  Encounter Date: 04/25/2014      PT End of Session - 04/26/14 0934    Visit Number 6   Number of Visits 17   Date for PT Re-Evaluation 06/03/14   PT Start Time 1318   PT Stop Time 1400   PT Time Calculation (min) 42 min   Equipment Utilized During Treatment Gait belt   Activity Tolerance Patient tolerated treatment well      Past Medical History  Diagnosis Date  . Hypertension   . Parkinson's disease   . Hyperlipidemia   . Dizziness   . Orthostatic hypotension   . Anemia   . Tremor   . Breast cancer     LEFT BREAST  . Kidney failure     stage 3  . Hypothyroidism   . Prediabetes   . TIA (transient ischemic attack)   . Hyperglycemia   . Left ventricular diastolic dysfunction   . Hypophonia     Past Surgical History  Procedure Laterality Date  . Cardiovascular stress test  06/2007    NORMAL  . Breast lumpectomy  2010  . Cholecystectomy  1994  . Tonsillectomy    . Nasal sinus surgery      submucous resection late 1950s  . Cataract extraction  2005,2006    Dr.Devonzo    There were no vitals taken for this visit.  Visit Diagnosis:  Bradykinesia  Weakness  Posture abnormality      Subjective Assessment - 04/25/14 1321    Symptoms Been doing good-no falls   Currently in Pain? Yes   Pain Score 5    Pain Location --  low back/trunk area   Pain Orientation Right;Left   Pain Type Acute pain   Pain Onset 1 to 4 weeks ago   Pain Frequency Intermittent   Aggravating Factors  leaning too much on walker   Pain Relieving Factors standing up tall       Therapeutic Activity:Sit<>stand transfers from 10 inch mat surface, then from 18 inch chair x 10 reps  each, with cues and assistance for increased forward lean for upright positioning upon standing.  PT provides min guard assistance and cues for increased momentum as well as for use of visual target.    Neuro Re-ed: At counter:  Single limb standing activities with alternating side hip kicks x 15 reps, alternating back hip kicks x 15 reps; anterior/posterior weightshifting x 15 reps, with emphasis on weightshifting in posterior direction; lateral weigthshifting x 15 reps with UE support at counter.  Forward step and weightshift x 15 reps, side step and weightshift x 15 reps, back step and weightshift x 15 reps with min guard assistance and cues for full weightshift excursion.  At 6 inch step:  Alternating step taps x 10 reps each leg, then forward step ups x 10 reps each leg, with cues to decrease reliance on UE support.  Standing with feet apart with head turns x 10 reps, then head nods x 10 reps.  With short distance gait with RW, PT provides verbal cues for increased step length and visual target for upright posture.  PT Short Term Goals - 04/26/14 4562    PT SHORT TERM GOAL #1   Title Pt will perform HEP with supervision for improved strength, balance, and gait. (Target date 05/03/14)   Time 4   Period Weeks   Status New   PT SHORT TERM GOAL #2   Title Pt will improve Berg Balance score to at least 25/56 for decreased fall risk.   Time 4   Period Weeks   Status New   PT SHORT TERM GOAL #3   Title Pt will improve TUG score to less than or equal to 30 seconds for improved ADL participation in home.   Time 4   Period Weeks   Status New   PT SHORT TERM GOAL #4   Title Pt will perform at least 8 of 10 reps of sit<>stand with minimal UE support from <18 inch surfaces, for improved transfer efficiency and safety.   Time 4   Period Weeks   Status New           PT Long Term Goals - 04/03/14 1141    PT LONG TERM GOAL #1   Title Pt will verbalize  understanding of fall prevention techniques within home environment. (Target date 06/03/14)   Time 8   Period Weeks   Status New   PT LONG TERM GOAL #2   Title Pt will improve Berg Balance score to at least 35/56 for decreased fall risk.   Time 8   Period Weeks   Status New   PT LONG TERM GOAL #3   Title Pt will imrpove TUG score to less than or equal to 20 seconds for decreased fall risk.   Time 8   Period Weeks   Status New   PT LONG TERM GOAL #4   Title Pt will improve gait velocity to at least 1.8 ft/sec for improved gait efficiency and safety.   Time 8   Period Weeks   Status New   PT LONG TERM GOAL #5   Title Pt will verbalize continued community fitness plans upon D/C from PT.   Time 8   Period Weeks   Status New               Plan - 04/26/14 0934    Clinical Impression Statement Pt very fearful of retropulsion, but pt able to perform weigthshifting activities well during therapy activities.   Pt will benefit from skilled therapeutic intervention in order to improve on the following deficits Abnormal gait;Decreased activity tolerance;Decreased balance;Decreased mobility;Decreased strength;Difficulty walking;Postural dysfunction   Rehab Potential Good   PT Frequency 2x / week   PT Duration 8 weeks  wk 3 of 8   PT Treatment/Interventions ADLs/Self Care Home Management;Gait training;Stair training;Functional mobility training;Neuromuscular re-education;Balance training;Therapeutic exercise;Therapeutic activities;Patient/family education   PT Next Visit Plan Continue with transfers, standing balance, weightshifting activities at counter   Consulted and Agree with Plan of Care Patient;Family member/caregiver   Family Member Consulted husband        Problem List Patient Active Problem List   Diagnosis Date Noted  . Dizziness of unknown cause 02/21/2014  . Left ventricular diastolic dysfunction with preserved systolic function 56/38/9373  . Essential hypertension  12/30/2013  . Transaminitis 12/24/2013  . Palpitations 12/23/2013  . Respiratory insufficiency 12/22/2013  . Normocytic anemia 12/21/2013  . Thrombocytopenia 12/21/2013  . Abnormal ECG 12/15/2013  . Abnormal involuntary movement 12/15/2013  . Colorectal polyps 12/15/2013  . Chest pain 12/15/2013  . Arteriosclerosis of coronary artery 12/15/2013  .  Chronic kidney disease (CKD), stage III (moderate) 12/15/2013  . Corn 12/15/2013  . Diabetes 12/15/2013  . Breathing difficult 12/15/2013  . Acid reflux 12/15/2013  . HLD (hyperlipidemia) 12/15/2013  . BP (high blood pressure) 12/15/2013  . FOM (frequency of micturition) 12/15/2013  . Breast cancer, female 12/15/2013  . Fungal infection of nail 12/15/2013  . Osteopenia 12/15/2013  . Raynaud's syndrome 12/15/2013  . Restless leg 12/15/2013  . Gougerout-Sjoegren syndrome 12/15/2013  . Change in blood platelet count 12/15/2013  . Infection of urinary tract 12/15/2013  . Lower urinary tract infection 10/12/2013  . Idiopathic Parkinson's disease 09/06/2013  . Bladder spasm 09/05/2013  . Urgency of micturation 09/05/2013  . Dyspnea 06/15/2012  . Hyperglycemia 12/27/2011  . Sjogren's syndrome 12/17/2010  . TIA (transient ischemic attack) 12/17/2010  . Benign hypertensive heart disease without heart failure 09/01/2010  . Parkinson's disease 09/01/2010  . Hypothyroidism 09/01/2010  . Hypercholesterolemia 09/01/2010  . Chronic anemia 09/01/2010    MARRIOTT,AMY W. 04/26/2014, 9:40 AM  Mady Haagensen, PT 04/26/2014 9:43 AM Phone: [phone] Fax: [facility] [address] [place], [place], [address] Phone: [phone]   Fax:  [phone]

## 2014-04-25 NOTE — Therapy (Signed)
[facility] [address] [place], [place], [address] Phone: [phone]   Fax:  [phone]  Occupational Therapy Treatment  Patient Details  Name: Victoria Lindsey MRN: [id number] Date of Birth: 07-May-1925 Referring Provider:  [name], DO  Encounter Date: 04/25/2014      OT End of Session - 04/25/14 1438    Visit Number 6   Number of Visits 17   Date for OT Re-Evaluation 06/01/14   Authorization Type Medicare   Authorization Time Period G code needed   Authorization - Visit Number 6   Authorization - Number of Visits 10   OT Start Time 1410   OT Stop Time 1450   OT Time Calculation (min) 40 min   Activity Tolerance Patient tolerated treatment well   Behavior During Therapy [facility] for tasks assessed/performed      Past Medical History  Diagnosis Date  . Hypertension   . Parkinson's disease   . Hyperlipidemia   . Dizziness   . Orthostatic hypotension   . Anemia   . Tremor   . Breast cancer     LEFT BREAST  . Kidney failure     stage 3  . Hypothyroidism   . Prediabetes   . TIA (transient ischemic attack)   . Hyperglycemia   . Left ventricular diastolic dysfunction   . Hypophonia     Past Surgical History  Procedure Laterality Date  . Cardiovascular stress test  06/2007    NORMAL  . Breast lumpectomy  2010  . Cholecystectomy  1994  . Tonsillectomy    . Nasal sinus surgery      submucous resection late 1950s  . Cataract extraction  2005,2006    Dr.Devonzo    There were no vitals taken for this visit.  Visit Diagnosis:  Bradykinesia  Decreased coordination  Weakness      Subjective Assessment - 04/25/14 1412    Currently in Pain? Yes   Pain Score 5    Pain Location Hip   Pain Orientation Right;Left   Pain Type Acute pain   Pain Onset 1 to 4 weeks ago   Pain Frequency Intermittent   Aggravating Factors  bending    Pain Relieving Factors sitting                 OT  Treatments/Exercises (OP) - 04/25/14 0001    ADLs   ADL Comments Bag exercises for simulated ADLS(simulating bathing/ dressing tasks): pass behind back, pass in front, lift overhead and touch each ankle by lifting leg, 10 reps each, min vc. Therapist did not issue leg exercise as homework due to pt's hip pain.  crumpling bag in hand 3 reps each hand   ADL Education Given Yes   Fine Motor Coordination   Flipping cards with bilateral UE's, pt requires minv.c. for big movements   Picking up coins with bilateral UE's min v.c.   Manipulating coins bilateral UE's min v.c.   O'Connor pegs Placing with LUE, min v.c.   Other Fine Motor Exercises PWR hands x 10 reps prior to fine motor tasks, min v.c./ demonstration                OT Education - 04/25/14 1428    Education Details bag exercises for simulated ADLS(crumple in hand, behind head, behind back, pass in front)   Person(s) Educated Patient;Spouse   Methods Explanation;Demonstration;Verbal cues;Handout   Comprehension Verbalized understanding;Returned demonstration  OT Short Term Goals - 04/03/14 1735    OT SHORT TERM GOAL #1   Title I with inital HEP.   Baseline ck 05/03/14   Time 4   Period Weeks   Status New   OT SHORT TERM GOAL #2   Title Pt / family will verbalize understanding of adapted strategies for ADLS/ IADLS.   Time 4   Period Weeks   Status New   OT SHORT TERM GOAL #3   Title Pt will demonstrate improved indpendence with dressing as evidenced by decreasing PPT #4 to 45 secs or less.   Time 4   Period Weeks   Status New   OT SHORT TERM GOAL #4   Title Pt will demonstrate ability to write 4 sentences with 100 % legibility and minimal decrease in letter size.   Time 4   Period Weeks   Status New           OT Long Term Goals - 04/03/14 1737    OT LONG TERM GOAL #1   Title Pt will perfom dressing with supervision/set up.   Baseline ck 06/01/14   Time 8   Period Weeks   Status New   OT LONG  TERM GOAL #2   Title Pt will perform light home management/ snack prep with min A.   Time 8   Period Weeks   Status New   OT LONG TERM GOAL #3   Title Pt will demonstrate improved standing balance for ADLS as evidenced by increasing standing functional reach by 3 inches bilaterally.   Baseline RUE 7.5 inches, LUE 6 inches   Time 8   Period Weeks   Status New   OT LONG TERM GOAL #4   Title Pt will demonstrate increased ease with feeding as evidenced by decreasing PPT #2 to 10 secs or less.   Baseline 14.66 secs   Time 8   Period Weeks   Status New   OT LONG TERM GOAL #5   Title Pt/ family will verbalize understanding of compensatory strategies for cognition and ways to keep thinking skills sharp   Time 8   Period Weeks   Status New               Plan - 04/25/14 1442    Clinical Impression Statement Pt is progressing towards goals for ADLs and coordination.   Rehab Potential Good   Clinical Impairments Affecting Rehab Potential decreased strength, decreased coordination, bradykinesia   OT Treatment/Interventions Self-care/ADL training;Fluidtherapy;DME and/or AE instruction;Splinting;Patient/family education;Balance training;Therapeutic exercises;Ultrasound;Therapeutic exercise;Therapeutic activities;Cognitive remediation/compensation;Passive range of motion;Functional Mobility Training;Neuromuscular education;Iontophoresis;Parrafin;Energy conservation;Manual Therapy;Visual/perceptual remediation/compensation   Plan adapted strategies for ADLS, review bag exercises, check STG's   OT Home Exercise Plan PWR seated, coordination, bag exercises (crumple, behind head, behind back, in fron pass) Avoid simulated LE dressing exercise due to hip pain   Consulted and Agree with Plan of Care Patient;Family member/caregiver        Problem List Patient Active Problem List   Diagnosis Date Noted  . Dizziness of unknown cause 02/21/2014  . Left ventricular diastolic dysfunction with  preserved systolic function 50/93/2671  . Essential hypertension 12/30/2013  . Transaminitis 12/24/2013  . Palpitations 12/23/2013  . Respiratory insufficiency 12/22/2013  . Normocytic anemia 12/21/2013  . Thrombocytopenia 12/21/2013  . Abnormal ECG 12/15/2013  . Abnormal involuntary movement 12/15/2013  . Colorectal polyps 12/15/2013  . Chest pain 12/15/2013  . Arteriosclerosis of coronary artery 12/15/2013  . Chronic kidney disease (CKD), stage III (moderate) 12/15/2013  .  Corn 12/15/2013  . Diabetes 12/15/2013  . Breathing difficult 12/15/2013  . Acid reflux 12/15/2013  . HLD (hyperlipidemia) 12/15/2013  . BP (high blood pressure) 12/15/2013  . FOM (frequency of micturition) 12/15/2013  . Breast cancer, female 12/15/2013  . Fungal infection of nail 12/15/2013  . Osteopenia 12/15/2013  . Raynaud's syndrome 12/15/2013  . Restless leg 12/15/2013  . Gougerout-Sjoegren syndrome 12/15/2013  . Change in blood platelet count 12/15/2013  . Infection of urinary tract 12/15/2013  . Lower urinary tract infection 10/12/2013  . Idiopathic Parkinson's disease 09/06/2013  . Bladder spasm 09/05/2013  . Urgency of micturation 09/05/2013  . Dyspnea 06/15/2012  . Hyperglycemia 12/27/2011  . Sjogren's syndrome 12/17/2010  . TIA (transient ischemic attack) 12/17/2010  . Benign hypertensive heart disease without heart failure 09/01/2010  . Parkinson's disease 09/01/2010  . Hypothyroidism 09/01/2010  . Hypercholesterolemia 09/01/2010  . Chronic anemia 09/01/2010    Gilda Abboud 04/25/2014, 4:16 PM Theone Murdoch, OTR/L [phone] Phone: [phone] 4:16 PM 04/25/2014 [facility] [address] [place], [place], [address] Phone: [phone]   Fax:  [phone]

## 2014-04-25 NOTE — Telephone Encounter (Signed)
Order faxed.

## 2014-04-26 ENCOUNTER — Other Ambulatory Visit: Payer: Self-pay | Admitting: Internal Medicine

## 2014-04-26 DIAGNOSIS — G20 Parkinson's disease: Secondary | ICD-10-CM

## 2014-04-26 DIAGNOSIS — G20A1 Parkinson's disease without dyskinesia, without mention of fluctuations: Secondary | ICD-10-CM

## 2014-04-26 DIAGNOSIS — R498 Other voice and resonance disorders: Secondary | ICD-10-CM

## 2014-04-29 ENCOUNTER — Ambulatory Visit: Payer: Medicare Other | Admitting: Occupational Therapy

## 2014-04-29 ENCOUNTER — Encounter: Payer: Self-pay | Admitting: Physical Therapy

## 2014-04-29 ENCOUNTER — Encounter: Payer: Self-pay | Admitting: Occupational Therapy

## 2014-04-29 ENCOUNTER — Ambulatory Visit: Payer: Medicare Other | Admitting: Physical Therapy

## 2014-04-29 DIAGNOSIS — R531 Weakness: Secondary | ICD-10-CM

## 2014-04-29 DIAGNOSIS — R278 Other lack of coordination: Secondary | ICD-10-CM

## 2014-04-29 DIAGNOSIS — R258 Other abnormal involuntary movements: Secondary | ICD-10-CM

## 2014-04-29 DIAGNOSIS — R279 Unspecified lack of coordination: Secondary | ICD-10-CM

## 2014-04-29 DIAGNOSIS — R269 Unspecified abnormalities of gait and mobility: Secondary | ICD-10-CM

## 2014-04-29 DIAGNOSIS — R293 Abnormal posture: Secondary | ICD-10-CM

## 2014-04-29 NOTE — Therapy (Signed)
[facility] [address] [place], [place], [address] Phone: [phone]   Fax:  [phone]  Occupational Therapy Treatment  Patient Details  Name: Victoria Lindsey MRN: [id number] Date of Birth: May 10, 1925 Referring Provider:  Elba Barman, MD  Encounter Date: 04/29/2014      OT End of Session - 04/29/14 1223    Visit Number 7   Number of Visits 17   Date for OT Re-Evaluation 06/01/14   Authorization Type Medicare   Authorization Time Period G code needed   Authorization - Visit Number 7   Authorization - Number of Visits 10   OT Start Time 1151   OT Stop Time 1230   OT Time Calculation (min) 39 min   Activity Tolerance Patient tolerated treatment well   Behavior During Therapy [facility] for tasks assessed/performed      Past Medical History  Diagnosis Date  . Hypertension   . Parkinson's disease   . Hyperlipidemia   . Dizziness   . Orthostatic hypotension   . Anemia   . Tremor   . Breast cancer     LEFT BREAST  . Kidney failure     stage 3  . Hypothyroidism   . Prediabetes   . TIA (transient ischemic attack)   . Hyperglycemia   . Left ventricular diastolic dysfunction   . Hypophonia     Past Surgical History  Procedure Laterality Date  . Cardiovascular stress test  06/2007    NORMAL  . Breast lumpectomy  2010  . Cholecystectomy  1994  . Tonsillectomy    . Nasal sinus surgery      submucous resection late 1950s  . Cataract extraction  2005,2006    Dr.Devonzo    There were no vitals taken for this visit.  Visit Diagnosis:  Bradykinesia  Weakness  Posture abnormality  Decreased coordination      Subjective Assessment - 04/29/14 1221    Symptoms "I have a lot of exercises"  (gave pt folder to organize exercises to make it easier to perform consistently)   Currently in Pain? No/denies                 OT Treatments/Exercises (OP) - 04/29/14 0001    ADLs   Overall ADLs  began checking STGs   UB Dressing Reviewed bag exercises for simulated dressing (pulling bag into palm for clothing adjustment, behind head for donnin/doffing shirt, behind back and in front for conning donning/doffing bra or jacket) with min-mod v.c. for big movements.  Recommended pt don bra herself from the fron for increased physicalactivity and independence.  Practiced donning/doffing jacket with gown with mod cueing for big movements/strategies.     LB Dressing Recommended pt use sock aide versus having husband assist with donning socks to increase physical activity and independence.  Pt/husband verbalized understanding.   Bathing recommended pt use long handled bath sponge to increase independence and participation in bathing tasks.  Simulated drying back with bag with min-mod verbal cues  Pt verbalized understanding.   Functional Mobility min v.c. for walking with big steps and posture, using big movements to scoot chair up to the table.   Writing Practiced writing with foam grip and lined paper with min v.c. for size.  Pt with 95% legibility and min-mod decrease in size.  Pt reports that she did not feel that the foam grip helped.  Continuous "l" with min v.c. for size.  Instructed pt to practice copying and continuous "l"  at home with lined paper and focus on big size.                OT Education - 04/29/14 1242    Education Details using big movements as strategy for ADLs, importance of pt attempting more of ADLs with less help from husband to increase activity level and prevent future complications   Person(s) Educated Patient;Spouse   Methods Explanation;Demonstration;Verbal cues   Comprehension Verbalized understanding;Returned demonstration;Verbal cues required          OT Short Term Goals - 04/29/14 1246    OT SHORT TERM GOAL #1   Title I with inital HEP.   Baseline ck 05/03/14   Time 4   Period Weeks   Status New   OT SHORT TERM GOAL #2   Title Pt / family will  verbalize understanding of adapted strategies for ADLS/ IADLS.   Time 4   Period Weeks   Status New   OT SHORT TERM GOAL #3   Title Pt will demonstrate improved indpendence with dressing as evidenced by decreasing PPT #4 to 45 secs or less.   Time 4   Period Weeks   Status On-going  not met 04/29/14:  53.66sec   OT SHORT TERM GOAL #4   Title Pt will demonstrate ability to write 4 sentences with 100 % legibility and minimal decrease in letter size.   Time 4   Period Weeks   Status On-going  Not met 04/29/14--95% legibility and min-mod decrease in size           OT Long Term Goals - 04/03/14 1737    OT LONG TERM GOAL #1   Title Pt will perfom dressing with supervision/set up.   Baseline ck 06/01/14   Time 8   Period Weeks   Status New   OT LONG TERM GOAL #2   Title Pt will perform light home management/ snack prep with min A.   Time 8   Period Weeks   Status New   OT LONG TERM GOAL #3   Title Pt will demonstrate improved standing balance for ADLS as evidenced by increasing standing functional reach by 3 inches bilaterally.   Baseline RUE 7.5 inches, LUE 6 inches   Time 8   Period Weeks   Status New   OT LONG TERM GOAL #4   Title Pt will demonstrate increased ease with feeding as evidenced by decreasing PPT #2 to 10 secs or less.   Baseline 14.66 secs   Time 8   Period Weeks   Status New   OT LONG TERM GOAL #5   Title Pt/ family will verbalize understanding of compensatory strategies for cognition and ways to keep thinking skills sharp   Time 8   Period Weeks   Status New               Plan - 04/29/14 1248    Clinical Impression Statement Pt progressing towards goals but did not met STG#3-4 due to progress slower than anticipated.  Encouraged pt to attempt more with ADLs with husband supervision.   Plan Check remainint STGs, strategies for ADLs   Consulted and Agree with Plan of Care Patient;Family member/caregiver   Family Member Consulted (husband present)         Problem List Patient Active Problem List   Diagnosis Date Noted  . Dizziness of unknown cause 02/21/2014  . Left ventricular diastolic dysfunction with preserved systolic function 34/05/7094  . Essential hypertension 12/30/2013  . Transaminitis  12/24/2013  . Palpitations 12/23/2013  . Respiratory insufficiency 12/22/2013  . Normocytic anemia 12/21/2013  . Thrombocytopenia 12/21/2013  . Abnormal ECG 12/15/2013  . Abnormal involuntary movement 12/15/2013  . Colorectal polyps 12/15/2013  . Chest pain 12/15/2013  . Arteriosclerosis of coronary artery 12/15/2013  . Chronic kidney disease (CKD), stage III (moderate) 12/15/2013  . Corn 12/15/2013  . Diabetes 12/15/2013  . Breathing difficult 12/15/2013  . Acid reflux 12/15/2013  . HLD (hyperlipidemia) 12/15/2013  . BP (high blood pressure) 12/15/2013  . FOM (frequency of micturition) 12/15/2013  . Breast cancer, female 12/15/2013  . Fungal infection of nail 12/15/2013  . Osteopenia 12/15/2013  . Raynaud's syndrome 12/15/2013  . Restless leg 12/15/2013  . Gougerout-Sjoegren syndrome 12/15/2013  . Change in blood platelet count 12/15/2013  . Infection of urinary tract 12/15/2013  . Lower urinary tract infection 10/12/2013  . Idiopathic Parkinson's disease 09/06/2013  . Bladder spasm 09/05/2013  . Urgency of micturation 09/05/2013  . Dyspnea 06/15/2012  . Hyperglycemia 12/27/2011  . Sjogren's syndrome 12/17/2010  . TIA (transient ischemic attack) 12/17/2010  . Benign hypertensive heart disease without heart failure 09/01/2010  . Parkinson's disease 09/01/2010  . Hypothyroidism 09/01/2010  . Hypercholesterolemia 09/01/2010  . Chronic anemia 09/01/2010    FREEMAN,ANGELA, OTR/L 04/29/2014, 1:28 PM  [facility] [address] [place], [place], [address] Phone: [phone]   Fax:  [phone]

## 2014-04-29 NOTE — Therapy (Signed)
[facility] [address] [place], [place], [address] Phone: [phone]   Fax:  [phone]  Physical Therapy Treatment  Patient Details  Name: Victoria Lindsey MRN: [id number] Date of Birth: 01/31/1926 Referring Provider:  Elba Barman, MD  Encounter Date: 04/29/2014      PT End of Session - 04/29/14 2053    Visit Number 7   Number of Visits 17   Date for PT Re-Evaluation 06/03/14   PT Start Time 2202   PT Stop Time 1150   PT Time Calculation (min) 26 min      Past Medical History  Diagnosis Date  . Hypertension   . Parkinson's disease   . Hyperlipidemia   . Dizziness   . Orthostatic hypotension   . Anemia   . Tremor   . Breast cancer     LEFT BREAST  . Kidney failure     stage 3  . Hypothyroidism   . Prediabetes   . TIA (transient ischemic attack)   . Hyperglycemia   . Left ventricular diastolic dysfunction   . Hypophonia     Past Surgical History  Procedure Laterality Date  . Cardiovascular stress test  06/2007    NORMAL  . Breast lumpectomy  2010  . Cholecystectomy  1994  . Tonsillectomy    . Nasal sinus surgery      submucous resection late 1950s  . Cataract extraction  2005,2006    Dr.Devonzo    There were no vitals taken for this visit.  Visit Diagnosis:  Abnormality of gait      Subjective Assessment - 04/29/14 2049    Symptoms Pt. reports that they had appt. time on calendar as being 11:45 - not 11:00 "don't know how it got changed"   Currently in Pain? No/denies           Pt. 20" late for PT appt. Due to having incorrect appt. Time; thought appt. Was 11:45 - didn't realize scheduled time was 11:00         [facility] Adult PT Treatment/Exercise - 04/29/14 0001    Transfers   Transfers Sit to Stand;Stand to Sit   Sit to Stand --  5x from mat without UE assist   Stand to Sit 5: Supervision  with tactile cues to lean more anteriorly   Stand to Sit Details tactile  cues to control descent    NeuroRe-ed: Rockerboard - weight shifts front/back x 10 reps with bil. UE support with mod. Tactile cues to weight shift toward left side and Anteriorly; tap ups to 4" step with bil. UE support Heel raises x 10 reps with bil. UE support  Gait: 71' with RW with cues for incr. Step length with CGA            PT Short Term Goals - 04/26/14 5427    PT SHORT TERM GOAL #1   Title Pt will perform HEP with supervision for improved strength, balance, and gait. (Target date 05/03/14)   Time 4   Period Weeks   Status New   PT SHORT TERM GOAL #2   Title Pt will improve Berg Balance score to at least 25/56 for decreased fall risk.   Time 4   Period Weeks   Status New   PT SHORT TERM GOAL #3   Title Pt will improve TUG score to less than or equal to 30 seconds for improved ADL participation in home.   Time 4   Period Weeks  Status New   PT SHORT TERM GOAL #4   Title Pt will perform at least 8 of 10 reps of sit<>stand with minimal UE support from <18 inch surfaces, for improved transfer efficiency and safety.   Time 4   Period Weeks   Status New           PT Long Term Goals - 04/03/14 1141    PT LONG TERM GOAL #1   Title Pt will verbalize understanding of fall prevention techniques within home environment. (Target date 06/03/14)   Time 8   Period Weeks   Status New   PT LONG TERM GOAL #2   Title Pt will improve Berg Balance score to at least 35/56 for decreased fall risk.   Time 8   Period Weeks   Status New   PT LONG TERM GOAL #3   Title Pt will imrpove TUG score to less than or equal to 20 seconds for decreased fall risk.   Time 8   Period Weeks   Status New   PT LONG TERM GOAL #4   Title Pt will improve gait velocity to at least 1.8 ft/sec for improved gait efficiency and safety.   Time 8   Period Weeks   Status New   PT LONG TERM GOAL #5   Title Pt will verbalize continued community fitness plans upon D/C from PT.   Time 8   Period  Weeks   Status New               Plan - 04/29/14 2053    Clinical Impression Statement Pt. leans toward R side and posteriorly - needed mod assist at times to come to midline standing on rockerboard; pt. also appeared fearful of falling while standing on rockerboard   Pt will benefit from skilled therapeutic intervention in order to improve on the following deficits Abnormal gait;Decreased activity tolerance;Decreased balance;Decreased mobility;Decreased strength;Difficulty walking;Postural dysfunction   Rehab Potential Good   PT Frequency 2x / week   PT Duration 8 weeks   PT Treatment/Interventions ADLs/Self Care Home Management;Gait training;Stair training;Functional mobility training;Neuromuscular re-education;Balance training;Therapeutic exercise;Therapeutic activities;Patient/family education   PT Next Visit Plan cont with balance training        Problem List Patient Active Problem List   Diagnosis Date Noted  . Dizziness of unknown cause 02/21/2014  . Left ventricular diastolic dysfunction with preserved systolic function 58/52/7782  . Essential hypertension 12/30/2013  . Transaminitis 12/24/2013  . Palpitations 12/23/2013  . Respiratory insufficiency 12/22/2013  . Normocytic anemia 12/21/2013  . Thrombocytopenia 12/21/2013  . Abnormal ECG 12/15/2013  . Abnormal involuntary movement 12/15/2013  . Colorectal polyps 12/15/2013  . Chest pain 12/15/2013  . Arteriosclerosis of coronary artery 12/15/2013  . Chronic kidney disease (CKD), stage III (moderate) 12/15/2013  . Corn 12/15/2013  . Diabetes 12/15/2013  . Breathing difficult 12/15/2013  . Acid reflux 12/15/2013  . HLD (hyperlipidemia) 12/15/2013  . BP (high blood pressure) 12/15/2013  . FOM (frequency of micturition) 12/15/2013  . Breast cancer, female 12/15/2013  . Fungal infection of nail 12/15/2013  . Osteopenia 12/15/2013  . Raynaud's syndrome 12/15/2013  . Restless leg 12/15/2013  . Gougerout-Sjoegren  syndrome 12/15/2013  . Change in blood platelet count 12/15/2013  . Infection of urinary tract 12/15/2013  . Lower urinary tract infection 10/12/2013  . Idiopathic Parkinson's disease 09/06/2013  . Bladder spasm 09/05/2013  . Urgency of micturation 09/05/2013  . Dyspnea 06/15/2012  . Hyperglycemia 12/27/2011  . Sjogren's syndrome 12/17/2010  .  TIA (transient ischemic attack) 12/17/2010  . Benign hypertensive heart disease without heart failure 09/01/2010  . Parkinson's disease 09/01/2010  . Hypothyroidism 09/01/2010  . Hypercholesterolemia 09/01/2010  . Chronic anemia 09/01/2010    Alda Lea, PT 04/29/2014, 8:57 PM  [facility] [address] [place], [place], [address] Phone: [phone]   Fax:  [phone]

## 2014-05-02 ENCOUNTER — Ambulatory Visit: Payer: Medicare Other | Admitting: Physical Therapy

## 2014-05-02 ENCOUNTER — Encounter: Payer: Self-pay | Admitting: Occupational Therapy

## 2014-05-02 ENCOUNTER — Encounter: Payer: Self-pay | Admitting: Physical Therapy

## 2014-05-02 ENCOUNTER — Ambulatory Visit: Payer: Medicare Other | Admitting: Occupational Therapy

## 2014-05-02 DIAGNOSIS — R269 Unspecified abnormalities of gait and mobility: Secondary | ICD-10-CM

## 2014-05-02 DIAGNOSIS — R531 Weakness: Secondary | ICD-10-CM

## 2014-05-02 DIAGNOSIS — R278 Other lack of coordination: Secondary | ICD-10-CM

## 2014-05-02 DIAGNOSIS — R279 Unspecified lack of coordination: Secondary | ICD-10-CM | POA: Diagnosis not present

## 2014-05-02 DIAGNOSIS — R293 Abnormal posture: Secondary | ICD-10-CM

## 2014-05-02 DIAGNOSIS — R258 Other abnormal involuntary movements: Secondary | ICD-10-CM

## 2014-05-02 NOTE — Therapy (Signed)
[facility] [address] [place], [place], [address] Phone: [phone]   Fax:  [phone]  Occupational Therapy Treatment  Patient Details  Name: Victoria Lindsey MRN: [id number] Date of Birth: [birth year] Referring Provider:  Emilio Aspen, MD  Encounter Date: 05/02/2014      OT End of Session - 05/02/14 1458    Visit Number 8   Number of Visits 17   Date for OT Re-Evaluation 06/01/14   Authorization Type Medicare   Authorization Time Period G code needed   Authorization - Visit Number 8   Authorization - Number of Visits 10   OT Start Time 1447   OT Stop Time 1530   OT Time Calculation (min) 43 min   Activity Tolerance Patient tolerated treatment well   Behavior During Therapy [facility] for tasks assessed/performed      Past Medical History  Diagnosis Date  . Hypertension   . Parkinson's disease   . Hyperlipidemia   . Dizziness   . Orthostatic hypotension   . Anemia   . Tremor   . Breast cancer     LEFT BREAST  . Kidney failure     stage 3  . Hypothyroidism   . Prediabetes   . TIA (transient ischemic attack)   . Hyperglycemia   . Left ventricular diastolic dysfunction   . Hypophonia     Past Surgical History  Procedure Laterality Date  . Cardiovascular stress test  06/2007    NORMAL  . Breast lumpectomy  2010  . Cholecystectomy  1994  . Tonsillectomy    . Nasal sinus surgery      submucous resection late 1950s  . Cataract extraction  2005,2006    Dr.Devonzo    There were no vitals taken for this visit.  Visit Diagnosis:  Bradykinesia  Decreased coordination  Posture abnormality  Weakness      Subjective Assessment - 05/02/14 1452    Symptoms everything's the "same"   Currently in Pain? No/denies                 OT Treatments/Exercises (OP) - 05/02/14 0001    ADLs   Eating Practiced opening jars and fastening nuts/bots to simulate bottles with min v.c./instruction  for big movements.   UB Dressing Reviewed bag exercises going behind head to simulate brushing hair/donning shirt with min v.c for big movements.   LB Dressing Bag exercises for clothing adjustment (pulling into palm) with min difficulty with R hand and mod difficulty with L hand.   Bathing Discussed bathroom set-up and adaptions for safety.  Pt already has grab bars, seat, long-handled bath sponge, non-skid rug/mat, and uses liquid soap.Marland Kitchen    Neurological Re-education Exercises   Reciprocal Movements Arm bike x81min level 1 for reciprocal movement with min cues to maintain 30-40rpms, pt able to maintain 24-33rpms   Functional Reaching Activities   High Level reaching behind and overhead with empasis on trunk rotation, forward wt. shift with feet apart, and set-up for big movements                   OT Short Term Goals - 05/02/14 1517    OT SHORT TERM GOAL #1   Title I with inital HEP.   Baseline ck 05/03/14   Time 4   Period Weeks   Status Achieved   OT SHORT TERM GOAL #2   Title Pt / family will verbalize understanding of adapted strategies for ADLS/ IADLS.  Time 4   Period Weeks   Status Achieved   OT SHORT TERM GOAL #3   Title Pt will demonstrate improved indpendence with dressing as evidenced by decreasing PPT #4 to 45 secs or less.   Time 4   Period Weeks   Status On-going  not met 04/29/14:  53.66sec   OT SHORT TERM GOAL #4   Title Pt will demonstrate ability to write 4 sentences with 100 % legibility and minimal decrease in letter size.   Time 4   Period Weeks   Status On-going  Not met 04/29/14--95% legibility and min-mod decrease in size           OT Long Term Goals - 04/03/14 1737    OT LONG TERM GOAL #1   Title Pt will perfom dressing with supervision/set up.   Baseline ck 06/01/14   Time 8   Period Weeks   Status New   OT LONG TERM GOAL #2   Title Pt will perform light home management/ snack prep with min A.   Time 8   Period Weeks   Status New    OT LONG TERM GOAL #3   Title Pt will demonstrate improved standing balance for ADLS as evidenced by increasing standing functional reach by 3 inches bilaterally.   Baseline RUE 7.5 inches, LUE 6 inches   Time 8   Period Weeks   Status New   OT LONG TERM GOAL #4   Title Pt will demonstrate increased ease with feeding as evidenced by decreasing PPT #2 to 10 secs or less.   Baseline 14.66 secs   Time 8   Period Weeks   Status New   OT LONG TERM GOAL #5   Title Pt/ family will verbalize understanding of compensatory strategies for cognition and ways to keep thinking skills sharp   Time 8   Period Weeks   Status New               Plan - 05/02/14 1518    Clinical Impression Statement STGs #1-2 met.     Plan big amplitude movements        Problem List Patient Active Problem List   Diagnosis Date Noted  . Dizziness of unknown cause 02/21/2014  . Left ventricular diastolic dysfunction with preserved systolic function 41/32/4401  . Essential hypertension 12/30/2013  . Transaminitis 12/24/2013  . Palpitations 12/23/2013  . Respiratory insufficiency 12/22/2013  . Normocytic anemia 12/21/2013  . Thrombocytopenia 12/21/2013  . Abnormal ECG 12/15/2013  . Abnormal involuntary movement 12/15/2013  . Colorectal polyps 12/15/2013  . Chest pain 12/15/2013  . Arteriosclerosis of coronary artery 12/15/2013  . Chronic kidney disease (CKD), stage III (moderate) 12/15/2013  . Corn 12/15/2013  . Diabetes 12/15/2013  . Breathing difficult 12/15/2013  . Acid reflux 12/15/2013  . HLD (hyperlipidemia) 12/15/2013  . BP (high blood pressure) 12/15/2013  . FOM (frequency of micturition) 12/15/2013  . Breast cancer, female 12/15/2013  . Fungal infection of nail 12/15/2013  . Osteopenia 12/15/2013  . Raynaud's syndrome 12/15/2013  . Restless leg 12/15/2013  . Gougerout-Sjoegren syndrome 12/15/2013  . Change in blood platelet count 12/15/2013  . Infection of urinary tract 12/15/2013  .  Lower urinary tract infection 10/12/2013  . Idiopathic Parkinson's disease 09/06/2013  . Bladder spasm 09/05/2013  . Urgency of micturation 09/05/2013  . Dyspnea 06/15/2012  . Hyperglycemia 12/27/2011  . Sjogren's syndrome 12/17/2010  . TIA (transient ischemic attack) 12/17/2010  . Benign hypertensive heart disease without heart failure  09/01/2010  . Parkinson's disease 09/01/2010  . Hypothyroidism 09/01/2010  . Hypercholesterolemia 09/01/2010  . Chronic anemia 09/01/2010    Lorain Keast, OTR/L 05/02/2014, 5:08 PM  [facility] [address] [place], [place], [address] Phone: [phone]   Fax:  [phone]

## 2014-05-02 NOTE — Therapy (Signed)
[facility] [address] [place], [place], [address] Phone: [phone]   Fax:  [phone]  Physical Therapy Treatment  Patient Details  Name: Victoria Lindsey MRN: [id number] Date of Birth: 01-26-1926 Referring Provider:  Elba Barman, MD  Encounter Date: 05/02/2014      PT End of Session - 05/02/14 1413    Visit Number 8   Number of Visits 17   Date for PT Re-Evaluation 06/03/14   PT Start Time 3664   PT Stop Time 1403   PT Time Calculation (min) 44 min   Equipment Utilized During Treatment Gait belt   Activity Tolerance Patient tolerated treatment well   Behavior During Therapy [facility] for tasks assessed/performed      Past Medical History  Diagnosis Date  . Hypertension   . Parkinson's disease   . Hyperlipidemia   . Dizziness   . Orthostatic hypotension   . Anemia   . Tremor   . Breast cancer     LEFT BREAST  . Kidney failure     stage 3  . Hypothyroidism   . Prediabetes   . TIA (transient ischemic attack)   . Hyperglycemia   . Left ventricular diastolic dysfunction   . Hypophonia     Past Surgical History  Procedure Laterality Date  . Cardiovascular stress test  06/2007    NORMAL  . Breast lumpectomy  2010  . Cholecystectomy  1994  . Tonsillectomy    . Nasal sinus surgery      submucous resection late 1950s  . Cataract extraction  2005,2006    Dr.Devonzo    There were no vitals taken for this visit.  Visit Diagnosis:  Abnormality of gait      Subjective Assessment - 05/02/14 1324    Symptoms Denies falls or changes.  "I hope speech therapy can do something with my voice.  I run out of breath."   Currently in Pain? No/denies          [facility] PT Assessment - 05/02/14 0001    Timed Up and Go Test   TUG Normal TUG   Normal TUG (seconds) 25.19                  [facility] Adult PT Treatment/Exercise - 05/02/14 0001    Transfers   Transfers Sit to Stand;Stand to Sit   Sit to Stand With upper extremity assist;5: Supervision;With armrests;From chair/3-in-1;Other/comment   10x from mat<18"-pushes posteriorly and leans against mat.    Berg Balance Test   Sit to Stand Able to stand  independently using hands   Standing Unsupported Able to stand 2 minutes with supervision   Sitting with Back Unsupported but Feet Supported on Floor or Stool Able to sit safely and securely 2 minutes   Stand to Sit Controls descent by using hands   Transfers Able to transfer safely, definite need of hands   Standing Unsupported with Eyes Closed Able to stand 10 seconds with supervision   Standing Ubsupported with Feet Together Needs help to attain position and unable to hold for 15 seconds   From Standing, Reach Forward with Outstretched Arm Can reach forward >12 cm safely (5")   From Standing Position, Pick up Object from Floor Able to pick up shoe, needs supervision   From Standing Position, Turn to Look Behind Over each Shoulder Turn sideways only but maintains balance   Turn 360 Degrees Needs close supervision or verbal cueing   Standing Unsupported, Alternately  Place Feet on Step/Stool Able to complete >2 steps/needs minimal assist   Standing Unsupported, One Foot in Front Needs help to step but can hold 15 seconds   Standing on One Leg Unable to try or needs assist to prevent fall   Total Score 30   Ankle Exercises: Stretches   Other Stretch seated bil heel cord stretch with black band x 30 sec x 3   Other Stretch seated PROM for heel cord stretch x 2 minutes                 PT Education - 05/02/14 1413    Education provided Yes   Education Details progress toward goals   Person(s) Educated Patient;Spouse   Methods Explanation   Comprehension Verbalized understanding          PT Short Term Goals - 05/02/14 1418    PT SHORT TERM GOAL #1   Title Pt will perform HEP with supervision for improved strength, balance, and gait. (Target date 05/03/14)   Period  Weeks   PT SHORT TERM GOAL #2   Title Pt will improve Berg Balance score to at least 25/56 for decreased fall risk.   Status Achieved   PT SHORT TERM GOAL #3   Title Pt will improve TUG score to less than or equal to 30 seconds for improved ADL participation in home.   Status Achieved   PT SHORT TERM GOAL #4   Title Pt will perform at least 8 of 10 reps of sit<>stand with minimal UE support from <18 inch surfaces, for improved transfer efficiency and safety.   Status Not Met           PT Long Term Goals - 04/03/14 1141    PT LONG TERM GOAL #1   Title Pt will verbalize understanding of fall prevention techniques within home environment. (Target date 06/03/14)   Time 8   Period Weeks   Status New   PT LONG TERM GOAL #2   Title Pt will improve Berg Balance score to at least 35/56 for decreased fall risk.   Time 8   Period Weeks   Status New   PT LONG TERM GOAL #3   Title Pt will imrpove TUG score to less than or equal to 20 seconds for decreased fall risk.   Time 8   Period Weeks   Status New   PT LONG TERM GOAL #4   Title Pt will improve gait velocity to at least 1.8 ft/sec for improved gait efficiency and safety.   Time 8   Period Weeks   Status New   PT LONG TERM GOAL #5   Title Pt will verbalize continued community fitness plans upon D/C from PT.   Time 8   Period Weeks   Status New               Plan - 05/02/14 1414    Clinical Impression Statement Pt met STG 2 & 3.  Goal 4 unmet.  Pt continues to have random episodes of posterior LOB in standing and unable to recover.   Pt will benefit from skilled therapeutic intervention in order to improve on the following deficits Abnormal gait;Decreased activity tolerance;Decreased balance;Decreased mobility;Decreased strength;Difficulty walking;Postural dysfunction   Rehab Potential Good   PT Frequency 2x / week   PT Duration 8 weeks   PT Treatment/Interventions ADLs/Self Care Home Management;Gait training;Stair  training;Functional mobility training;Neuromuscular re-education;Balance training;Therapeutic exercise;Therapeutic activities;Patient/family education   PT Next Visit Plan Finish checking goals (  HEP).  Passive heel cord stretch.  Transfers.   Consulted and Agree with Plan of Care Patient;Family member/caregiver   Family Member Consulted husband        Problem List Patient Active Problem List   Diagnosis Date Noted  . Dizziness of unknown cause 02/21/2014  . Left ventricular diastolic dysfunction with preserved systolic function 83/11/4074  . Essential hypertension 12/30/2013  . Transaminitis 12/24/2013  . Palpitations 12/23/2013  . Respiratory insufficiency 12/22/2013  . Normocytic anemia 12/21/2013  . Thrombocytopenia 12/21/2013  . Abnormal ECG 12/15/2013  . Abnormal involuntary movement 12/15/2013  . Colorectal polyps 12/15/2013  . Chest pain 12/15/2013  . Arteriosclerosis of coronary artery 12/15/2013  . Chronic kidney disease (CKD), stage III (moderate) 12/15/2013  . Corn 12/15/2013  . Diabetes 12/15/2013  . Breathing difficult 12/15/2013  . Acid reflux 12/15/2013  . HLD (hyperlipidemia) 12/15/2013  . BP (high blood pressure) 12/15/2013  . FOM (frequency of micturition) 12/15/2013  . Breast cancer, female 12/15/2013  . Fungal infection of nail 12/15/2013  . Osteopenia 12/15/2013  . Raynaud's syndrome 12/15/2013  . Restless leg 12/15/2013  . Gougerout-Sjoegren syndrome 12/15/2013  . Change in blood platelet count 12/15/2013  . Infection of urinary tract 12/15/2013  . Lower urinary tract infection 10/12/2013  . Idiopathic Parkinson's disease 09/06/2013  . Bladder spasm 09/05/2013  . Urgency of micturation 09/05/2013  . Dyspnea 06/15/2012  . Hyperglycemia 12/27/2011  . Sjogren's syndrome 12/17/2010  . TIA (transient ischemic attack) 12/17/2010  . Benign hypertensive heart disease without heart failure 09/01/2010  . Parkinson's disease 09/01/2010  . Hypothyroidism  09/01/2010  . Hypercholesterolemia 09/01/2010  . Chronic anemia 09/01/2010    Narda Bonds 05/02/2014, 2:20 PM  [facility] [address] [place], [place], [address] Phone: [phone]   Fax:  [place] 05/02/2014 2:20 PM Phone: [phone] Fax: [phone]

## 2014-05-06 ENCOUNTER — Encounter: Payer: Self-pay | Admitting: Occupational Therapy

## 2014-05-06 ENCOUNTER — Ambulatory Visit: Payer: Medicare Other | Admitting: Occupational Therapy

## 2014-05-06 ENCOUNTER — Ambulatory Visit: Payer: Medicare Other | Admitting: Physical Therapy

## 2014-05-06 DIAGNOSIS — R29898 Other symptoms and signs involving the musculoskeletal system: Secondary | ICD-10-CM

## 2014-05-06 DIAGNOSIS — R278 Other lack of coordination: Secondary | ICD-10-CM

## 2014-05-06 DIAGNOSIS — R531 Weakness: Secondary | ICD-10-CM

## 2014-05-06 DIAGNOSIS — R258 Other abnormal involuntary movements: Secondary | ICD-10-CM

## 2014-05-06 DIAGNOSIS — R279 Unspecified lack of coordination: Secondary | ICD-10-CM

## 2014-05-06 DIAGNOSIS — R269 Unspecified abnormalities of gait and mobility: Secondary | ICD-10-CM

## 2014-05-06 DIAGNOSIS — R293 Abnormal posture: Secondary | ICD-10-CM

## 2014-05-06 NOTE — Therapy (Signed)
[facility] [address] [place], [place], [address] Phone: [phone]   Fax:  [phone]  Occupational Therapy Treatment  Patient Details  Name: Victoria Lindsey MRN: [id number] Date of Birth: 08/13/1925 Referring Provider:  Elba Barman, MD  Encounter Date: 05/06/2014      OT End of Session - 05/06/14 1130    Visit Number 9   Number of Visits 17   Date for OT Re-Evaluation 06/01/14   Authorization Type Medicare   Authorization Time Period G code needed   Authorization - Visit Number 9   Authorization - Number of Visits 10   OT Start Time 1108   OT Stop Time 1146   OT Time Calculation (min) 38 min   Activity Tolerance Patient tolerated treatment well      Past Medical History  Diagnosis Date  . Hypertension   . Parkinson's disease   . Hyperlipidemia   . Dizziness   . Orthostatic hypotension   . Anemia   . Tremor   . Breast cancer     LEFT BREAST  . Kidney failure     stage 3  . Hypothyroidism   . Prediabetes   . TIA (transient ischemic attack)   . Hyperglycemia   . Left ventricular diastolic dysfunction   . Hypophonia     Past Surgical History  Procedure Laterality Date  . Cardiovascular stress test  06/2007    NORMAL  . Breast lumpectomy  2010  . Cholecystectomy  1994  . Tonsillectomy    . Nasal sinus surgery      submucous resection late 1950s  . Cataract extraction  2005,2006    Dr.Devonzo    There were no vitals taken for this visit.  Visit Diagnosis:  Bradykinesia  Rigidity  Posture abnormality  Weakness  Decreased coordination      Subjective Assessment - 05/06/14 1128    Symptoms Pt reports tooth abcess and is on antibiotics    Currently in Pain? No/denies                 OT Treatments/Exercises (OP) - 05/06/14 0001    Neurological Re-education Exercises   Other Exercises 1 PWR! up, rock, twist in supine with mod verbal/tactile cues for big  amplitude movement, but improved with cues/repetition   Other Exercises 2 Functional reaching to reach across and laterally to toss scarves with focus on big movements/PWR! hands with min-mod cues initially, incorporating wt. shift in sitting   Functional Reaching Activities   Low Level Functional reaching with trunk rotation to pick up coins and manipulate in hand for coordination to place in targets with min v.c. and set-up for big movements, min difficulty   Mid Level Functional reaching laterally and across body for trunk rotation/wt.shift in sitting to perform reaching to place clothespins with 1-8lb resistance on vertical pole                  OT Short Term Goals - 05/02/14 1517    OT SHORT TERM GOAL #1   Title I with inital HEP.   Baseline ck 05/03/14   Time 4   Period Weeks   Status Achieved   OT SHORT TERM GOAL #2   Title Pt / family will verbalize understanding of adapted strategies for ADLS/ IADLS.   Time 4   Period Weeks   Status Achieved   OT SHORT TERM GOAL #3   Title Pt will demonstrate improved indpendence with dressing as evidenced  by decreasing PPT #4 to 45 secs or less.   Time 4   Period Weeks   Status On-going  not met 04/29/14:  53.66sec   OT SHORT TERM GOAL #4   Title Pt will demonstrate ability to write 4 sentences with 100 % legibility and minimal decrease in letter size.   Time 4   Period Weeks   Status On-going  Not met 04/29/14--95% legibility and min-mod decrease in size           OT Long Term Goals - 04/03/14 1737    OT LONG TERM GOAL #1   Title Pt will perfom dressing with supervision/set up.   Baseline ck 06/01/14   Time 8   Period Weeks   Status New   OT LONG TERM GOAL #2   Title Pt will perform light home management/ snack prep with min A.   Time 8   Period Weeks   Status New   OT LONG TERM GOAL #3   Title Pt will demonstrate improved standing balance for ADLS as evidenced by increasing standing functional reach by 3 inches  bilaterally.   Baseline RUE 7.5 inches, LUE 6 inches   Time 8   Period Weeks   Status New   OT LONG TERM GOAL #4   Title Pt will demonstrate increased ease with feeding as evidenced by decreasing PPT #2 to 10 secs or less.   Baseline 14.66 secs   Time 8   Period Weeks   Status New   OT LONG TERM GOAL #5   Title Pt/ family will verbalize understanding of compensatory strategies for cognition and ways to keep thinking skills sharp   Time 8   Period Weeks   Status New               Plan - 05/06/14 1130    Clinical Impression Statement Pt progressing with bigger movements with cueing/repetition   Plan big amplitude movements, g-code next session        Problem List Patient Active Problem List   Diagnosis Date Noted  . Dizziness of unknown cause 02/21/2014  . Left ventricular diastolic dysfunction with preserved systolic function 16/38/4665  . Essential hypertension 12/30/2013  . Transaminitis 12/24/2013  . Palpitations 12/23/2013  . Respiratory insufficiency 12/22/2013  . Normocytic anemia 12/21/2013  . Thrombocytopenia 12/21/2013  . Abnormal ECG 12/15/2013  . Abnormal involuntary movement 12/15/2013  . Colorectal polyps 12/15/2013  . Chest pain 12/15/2013  . Arteriosclerosis of coronary artery 12/15/2013  . Chronic kidney disease (CKD), stage III (moderate) 12/15/2013  . Corn 12/15/2013  . Diabetes 12/15/2013  . Breathing difficult 12/15/2013  . Acid reflux 12/15/2013  . HLD (hyperlipidemia) 12/15/2013  . BP (high blood pressure) 12/15/2013  . FOM (frequency of micturition) 12/15/2013  . Breast cancer, female 12/15/2013  . Fungal infection of nail 12/15/2013  . Osteopenia 12/15/2013  . Raynaud's syndrome 12/15/2013  . Restless leg 12/15/2013  . Gougerout-Sjoegren syndrome 12/15/2013  . Change in blood platelet count 12/15/2013  . Infection of urinary tract 12/15/2013  . Lower urinary tract infection 10/12/2013  . Idiopathic Parkinson's disease  09/06/2013  . Bladder spasm 09/05/2013  . Urgency of micturation 09/05/2013  . Dyspnea 06/15/2012  . Hyperglycemia 12/27/2011  . Sjogren's syndrome 12/17/2010  . TIA (transient ischemic attack) 12/17/2010  . Benign hypertensive heart disease without heart failure 09/01/2010  . Parkinson's disease 09/01/2010  . Hypothyroidism 09/01/2010  . Hypercholesterolemia 09/01/2010  . Chronic anemia 09/01/2010    FREEMAN,ANGELA, OTR/L  05/06/2014, 11:52 AM  [facility] [address] [place], [place], [address] Phone: [phone]   Fax:  [phone]

## 2014-05-06 NOTE — Patient Instructions (Addendum)
FLEXION: Sitting (Active)   Scoot out to the edge of the chair.  Sit with right leg extended. Bend knee and slide your heel backward. Keep your heel on the floor.  Hold your heel on the floor for 30 seconds, 3 reps per leg. Perform _2-3__ sessions per day.  Copyright  VHI. All rights reserved.

## 2014-05-06 NOTE — Therapy (Signed)
[facility] [address] [place], [place], [address] Phone: [phone]   Fax:  [phone]  Physical Therapy Treatment  Patient Details  Name: Victoria Lindsey MRN: [id number] Date of Birth: 01/24/1926 Referring Provider:  Elba Barman, MD  Encounter Date: 05/06/2014      PT End of Session - 05/06/14 1251    Visit Number 9   Number of Visits 17   Date for PT Re-Evaluation 06/03/14   PT Start Time 1147   PT Stop Time 1230   PT Time Calculation (min) 43 min   Equipment Utilized During Treatment Gait belt   Activity Tolerance Patient tolerated treatment well      Past Medical History  Diagnosis Date  . Hypertension   . Parkinson's disease   . Hyperlipidemia   . Dizziness   . Orthostatic hypotension   . Anemia   . Tremor   . Breast cancer     LEFT BREAST  . Kidney failure     stage 3  . Hypothyroidism   . Prediabetes   . TIA (transient ischemic attack)   . Hyperglycemia   . Left ventricular diastolic dysfunction   . Hypophonia     Past Surgical History  Procedure Laterality Date  . Cardiovascular stress test  06/2007    NORMAL  . Breast lumpectomy  2010  . Cholecystectomy  1994  . Tonsillectomy    . Nasal sinus surgery      submucous resection late 1950s  . Cataract extraction  2005,2006    Dr.Devonzo    There were no vitals taken for this visit.  Visit Diagnosis:  Bradykinesia  Rigidity  Abnormality of gait      Subjective Assessment - 05/06/14 1150    Symptoms Had an abscess in R front mouth and have to go to get root canal tomorrow.   Currently in Pain? No/denies       Therapeutic Exercise: Reviewed seated heelcord stretch using thick, gray theraband, 2 reps x 30 seconds.  Seated heelslide heelcord stretches 3 x 30 seconds.  (Added to HEP).  Seated w/c push ups to promote forward lean x 10 reps.  Minisquats at counter x 10 reps.  NeuroReeducation: Sit to stand x 10 reps  from chair with UE support with supervision and no posterior lean.  Reviewed standing exercises as part of HEP-heel/toe raises x 10 reps with min guard assistance due to posterior loss of balance on first rep; hip strategy work with heel/toe raises x 10 reps with tactile cues at hips.  Standing hip extension x 10, hip abduction x 10, marching in place x 10 reps with UE support and close supervision.  Standing lateral step and weightshift x 10 reps, backward step and weightshift x 10 reps, for improved dynamic balance.  Standing cues for widened base of support with lateral weightshifting x 5 reps.  Cues provided for posture throughout exercises.                      PT Education - 05/06/14 1251    Education provided Yes   Education Details HEP-addition of seated knee flexion stretch   Person(s) Educated Patient;Spouse   Methods Explanation;Demonstration;Handout   Comprehension Verbalized understanding;Returned demonstration          PT Short Term Goals - 05/06/14 1256    PT SHORT TERM GOAL #1   Title Pt will perform HEP with supervision for improved strength, balance, and gait. (Target date  05/03/14)   Status Not Met   PT SHORT TERM GOAL #2   Title Pt will improve Berg Balance score to at least 25/56 for decreased fall risk.   Status Achieved   PT SHORT TERM GOAL #3   Title Pt will improve TUG score to less than or equal to 30 seconds for improved ADL participation in home.   Status Achieved   PT SHORT TERM GOAL #4   Title Pt will perform at least 8 of 10 reps of sit<>stand with minimal UE support from <18 inch surfaces, for improved transfer efficiency and safety.   Status Not Met           PT Long Term Goals - 04/03/14 1141    PT LONG TERM GOAL #1   Title Pt will verbalize understanding of fall prevention techniques within home environment. (Target date 06/03/14)   Time 8   Period Weeks   Status New   PT LONG TERM GOAL #2   Title Pt will improve Berg Balance  score to at least 35/56 for decreased fall risk.   Time 8   Period Weeks   Status New   PT LONG TERM GOAL #3   Title Pt will imrpove TUG score to less than or equal to 20 seconds for decreased fall risk.   Time 8   Period Weeks   Status New   PT LONG TERM GOAL #4   Title Pt will improve gait velocity to at least 1.8 ft/sec for improved gait efficiency and safety.   Time 8   Period Weeks   Status New   PT LONG TERM GOAL #5   Title Pt will verbalize continued community fitness plans upon D/C from PT.   Time 8   Period Weeks   Status New               Plan - 05/06/14 1254    Clinical Impression Statement STG #1 not met as patient needs cueing for appropriate technique.  Pt has one episode of posterior loss of balance with heel/toe raises at counter.  Instructed patient in use of hip strategy for assistance in prevention such strong posterior lean.   Pt will benefit from skilled therapeutic intervention in order to improve on the following deficits Abnormal gait;Decreased activity tolerance;Decreased balance;Decreased mobility;Decreased strength;Difficulty walking;Postural dysfunction   Rehab Potential Good   PT Frequency 2x / week   PT Duration 8 weeks   PT Treatment/Interventions ADLs/Self Care Home Management;Gait training;Stair training;Functional mobility training;Neuromuscular re-education;Balance training;Therapeutic exercise;Therapeutic activities;Patient/family education   PT Next Visit Plan Review seated hamstring stretch; transfers, standing balance exercises        Problem List Patient Active Problem List   Diagnosis Date Noted  . Dizziness of unknown cause 02/21/2014  . Left ventricular diastolic dysfunction with preserved systolic function 78/58/8502  . Essential hypertension 12/30/2013  . Transaminitis 12/24/2013  . Palpitations 12/23/2013  . Respiratory insufficiency 12/22/2013  . Normocytic anemia 12/21/2013  . Thrombocytopenia 12/21/2013  . Abnormal  ECG 12/15/2013  . Abnormal involuntary movement 12/15/2013  . Colorectal polyps 12/15/2013  . Chest pain 12/15/2013  . Arteriosclerosis of coronary artery 12/15/2013  . Chronic kidney disease (CKD), stage III (moderate) 12/15/2013  . Corn 12/15/2013  . Diabetes 12/15/2013  . Breathing difficult 12/15/2013  . Acid reflux 12/15/2013  . HLD (hyperlipidemia) 12/15/2013  . BP (high blood pressure) 12/15/2013  . FOM (frequency of micturition) 12/15/2013  . Breast cancer, female 12/15/2013  . Fungal infection of nail  12/15/2013  . Osteopenia 12/15/2013  . Raynaud's syndrome 12/15/2013  . Restless leg 12/15/2013  . Gougerout-Sjoegren syndrome 12/15/2013  . Change in blood platelet count 12/15/2013  . Infection of urinary tract 12/15/2013  . Lower urinary tract infection 10/12/2013  . Idiopathic Parkinson's disease 09/06/2013  . Bladder spasm 09/05/2013  . Urgency of micturation 09/05/2013  . Dyspnea 06/15/2012  . Hyperglycemia 12/27/2011  . Sjogren's syndrome 12/17/2010  . TIA (transient ischemic attack) 12/17/2010  . Benign hypertensive heart disease without heart failure 09/01/2010  . Parkinson's disease 09/01/2010  . Hypothyroidism 09/01/2010  . Hypercholesterolemia 09/01/2010  . Chronic anemia 09/01/2010    MARRIOTT,AMY W. 05/06/2014, 12:59 PM  Mady Haagensen, PT 05/06/2014 1:17 PM Phone: [phone] Fax: [facility] [address] [place], [place], [address] Phone: [phone]   Fax:  [phone]

## 2014-05-08 ENCOUNTER — Ambulatory Visit: Payer: Medicare Other | Admitting: Physical Therapy

## 2014-05-08 ENCOUNTER — Ambulatory Visit: Payer: Medicare Other

## 2014-05-08 ENCOUNTER — Ambulatory Visit: Payer: Medicare Other | Attending: Internal Medicine | Admitting: Occupational Therapy

## 2014-05-08 DIAGNOSIS — R269 Unspecified abnormalities of gait and mobility: Secondary | ICD-10-CM

## 2014-05-08 DIAGNOSIS — G20 Parkinson's disease: Secondary | ICD-10-CM | POA: Diagnosis not present

## 2014-05-08 DIAGNOSIS — R258 Other abnormal involuntary movements: Secondary | ICD-10-CM | POA: Diagnosis not present

## 2014-05-08 DIAGNOSIS — R279 Unspecified lack of coordination: Secondary | ICD-10-CM | POA: Insufficient documentation

## 2014-05-08 DIAGNOSIS — R29898 Other symptoms and signs involving the musculoskeletal system: Secondary | ICD-10-CM

## 2014-05-08 DIAGNOSIS — R293 Abnormal posture: Secondary | ICD-10-CM | POA: Insufficient documentation

## 2014-05-08 DIAGNOSIS — R490 Dysphonia: Secondary | ICD-10-CM | POA: Diagnosis not present

## 2014-05-08 DIAGNOSIS — R531 Weakness: Secondary | ICD-10-CM | POA: Diagnosis not present

## 2014-05-08 DIAGNOSIS — R278 Other lack of coordination: Secondary | ICD-10-CM

## 2014-05-08 NOTE — Therapy (Addendum)
[facility] [address] [place], [place], [address] Phone: [phone]   Fax:  [phone]  Occupational Therapy Treatment  Patient Details  Name: Victoria Lindsey MRN: [id number] Date of Birth: 1926/02/03 Referring Provider:  Elba Barman, MD  Encounter Date: 05/08/2014      OT End of Session - 05/08/14 1251    OT Start Time 1108   OT Stop Time 1146   OT Time Calculation (min) 38 min   Activity Tolerance Patient tolerated treatment well   Behavior During Therapy [facility] for tasks assessed/performed      Past Medical History  Diagnosis Date  . Hypertension   . Parkinson's disease   . Hyperlipidemia   . Dizziness   . Orthostatic hypotension   . Anemia   . Tremor   . Breast cancer     LEFT BREAST  . Kidney failure     stage 3  . Hypothyroidism   . Prediabetes   . TIA (transient ischemic attack)   . Hyperglycemia   . Left ventricular diastolic dysfunction   . Hypophonia     Past Surgical History  Procedure Laterality Date  . Cardiovascular stress test  06/2007    NORMAL  . Breast lumpectomy  2010  . Cholecystectomy  1994  . Tonsillectomy    . Nasal sinus surgery      submucous resection late 1950s  . Cataract extraction  2005,2006    Dr.Devonzo    There were no vitals taken for this visit.  Visit Diagnosis:  Bradykinesia  Rigidity  Weakness  Decreased coordination      Subjective Assessment - 05/08/14 1250    Symptoms Pt feels weeaker today and attributes to her antibiotics for tooth abcess.   Currently in Pain? Yes   Pain Score 5    Pain Location Hip   Pain Orientation Right   Pain Type Chronic pain   Pain Onset More than a month ago   Pain Frequency Constant   Aggravating Factors  standing and walking   Pain Relieving Factors sitting down and resting   Multiple Pain Sites No      Treatment:Neuro re-ed: PWR! exercises in supine 1-3, 10 -20 reps each, min-mod  v.c. Theraputic activities: Seated pt performed trunk rotation with bilateral UE's to place graded clothespins on antennae with large amplitude movements, min v.c.. Fine motor coordination task, placing grooved pegs in pegboard with LUe, min v.c. UBE x 5 mins level, min v.c. For speed, pt only able to maintain 23-28 RPM due to fatigue.                     OT Short Term Goals - 05/02/14 1517    OT SHORT TERM GOAL #1   Title I with inital HEP.   Baseline ck 05/03/14   Time 4   Period Weeks   Status Achieved   OT SHORT TERM GOAL #2   Title Pt / family will verbalize understanding of adapted strategies for ADLS/ IADLS.   Time 4   Period Weeks   Status Achieved   OT SHORT TERM GOAL #3   Title Pt will demonstrate improved indpendence with dressing as evidenced by decreasing PPT #4 to 45 secs or less.   Time 4   Period Weeks   Status On-going  not met 04/29/14:  53.66sec   OT SHORT TERM GOAL #4   Title Pt will demonstrate ability to write 4 sentences with 100 % legibility  and minimal decrease in letter size.   Time 4   Period Weeks   Status On-going  Not met 04/29/14--95% legibility and min-mod decrease in size           OT Long Term Goals - 04/03/14 1737    OT LONG TERM GOAL #1   Title Pt will perfom dressing with supervision/set up.   Baseline ck 06/01/14   Time 8   Period Weeks   Status New   OT LONG TERM GOAL #2   Title Pt will perform light home management/ snack prep with min A.   Time 8   Period Weeks   Status New   OT LONG TERM GOAL #3   Title Pt will demonstrate improved standing balance for ADLS as evidenced by increasing standing functional reach by 3 inches bilaterally.   Baseline RUE 7.5 inches, LUE 6 inches   Time 8   Period Weeks   Status New   OT LONG TERM GOAL #4   Title Pt will demonstrate increased ease with feeding as evidenced by decreasing PPT #2 to 10 secs or less.   Baseline 14.66 secs   Time 8   Period Weeks   Status New   OT  LONG TERM GOAL #5   Title Pt/ family will verbalize understanding of compensatory strategies for cognition and ways to keep thinking skills sharp   Time 8   Period Weeks   Status New               Plan - 05/26/14 1252    Clinical Impression Statement Pt is progressing towards goals.   Rehab Potential Good   Clinical Impairments Affecting Rehab Potential decreased strength, decreased coordination, bradykinesia   Plan big amplitude movements, ADL   OT Home Exercise Plan PWR seated, coordination, bag exercises (crumple, behind head, behind back, in fron pass)   Consulted and Agree with Plan of Care Patient;Family member/caregiver   Family Member Consulted (husband present)          G-Codes - 2014/05/26 1305    Functional Assessment Tool Used PPT #2 15.50 PPT #4: 53.66   Functional Limitation Self care   Self Care Current Status (U7253) At least 40 percent but less than 60 percent impaired, limited or restricted   Self Care Goal Status (G6440) At least 20 percent but less than 40 percent impaired, limited or restricted      Problem List Patient Active Problem List   Diagnosis Date Noted  . Dizziness of unknown cause 02/21/2014  . Left ventricular diastolic dysfunction with preserved systolic function 34/74/2595  . Essential hypertension 12/30/2013  . Transaminitis 12/24/2013  . Palpitations 12/23/2013  . Respiratory insufficiency 12/22/2013  . Normocytic anemia 12/21/2013  . Thrombocytopenia 12/21/2013  . Abnormal ECG 12/15/2013  . Abnormal involuntary movement 12/15/2013  . Colorectal polyps 12/15/2013  . Chest pain 12/15/2013  . Arteriosclerosis of coronary artery 12/15/2013  . Chronic kidney disease (CKD), stage III (moderate) 12/15/2013  . Corn 12/15/2013  . Diabetes 12/15/2013  . Breathing difficult 12/15/2013  . Acid reflux 12/15/2013  . HLD (hyperlipidemia) 12/15/2013  . BP (high blood pressure) 12/15/2013  . FOM (frequency of micturition) 12/15/2013  .  Breast cancer, female 12/15/2013  . Fungal infection of nail 12/15/2013  . Osteopenia 12/15/2013  . Raynaud's syndrome 12/15/2013  . Restless leg 12/15/2013  . Gougerout-Sjoegren syndrome 12/15/2013  . Change in blood platelet count 12/15/2013  . Infection of urinary tract 12/15/2013  . Lower urinary tract infection  10/12/2013  . Idiopathic Parkinson's disease 09/06/2013  . Bladder spasm 09/05/2013  . Urgency of micturation 09/05/2013  . Dyspnea 06/15/2012  . Hyperglycemia 12/27/2011  . Sjogren's syndrome 12/17/2010  . TIA (transient ischemic attack) 12/17/2010  . Benign hypertensive heart disease without heart failure 09/01/2010  . Parkinson's disease 09/01/2010  . Hypothyroidism 09/01/2010  . Hypercholesterolemia 09/01/2010  . Chronic anemia 09/01/2010    Dinisha Cai 05/08/2014, 1:07 PM Theone Murdoch, OTR/L [phone] Phone: [phone] 1:07 PM 05/08/2014 [facility] [address] [place], [place], [address] Phone: [phone]   Fax:  [phone]

## 2014-05-08 NOTE — Therapy (Signed)
[facility] [address] [place], [place], [address] Phone: [phone]   Fax:  [phone]  Physical Therapy Treatment  Patient Details  Name: Victoria Lindsey MRN: [id number] Date of Birth: 02/25/1926 Referring Provider:  Jacobucci, Nicola J, MD  Encounter Date: 05/08/2014      PT End of Session - 05/08/14 1655    Visit Number 10   Number of Visits 17   Date for PT Re-Evaluation 06/03/14   PT Start Time 1148   PT Stop Time 1230   PT Time Calculation (min) 42 min   Equipment Utilized During Treatment Gait belt   Activity Tolerance Patient tolerated treatment well      Past Medical History  Diagnosis Date  . Hypertension   . Parkinson's disease   . Hyperlipidemia   . Dizziness   . Orthostatic hypotension   . Anemia   . Tremor   . Breast cancer     LEFT BREAST  . Kidney failure     stage 3  . Hypothyroidism   . Prediabetes   . TIA (transient ischemic attack)   . Hyperglycemia   . Left ventricular diastolic dysfunction   . Hypophonia     Past Surgical History  Procedure Laterality Date  . Cardiovascular stress test  06/2007    NORMAL  . Breast lumpectomy  2010  . Cholecystectomy  1994  . Tonsillectomy    . Nasal sinus surgery      submucous resection late 1950s  . Cataract extraction  2005,2006    Dr.Devonzo    There were no vitals taken for this visit.  Visit Diagnosis:  Bradykinesia  Rigidity  Abnormality of gait      Subjective Assessment - 05/08/14 1151    Symptoms Had root canal yesterday-don't feel as good-maybe it's the antibiotics   Currently in Pain? Yes   Pain Score 5    Pain Location Hip   Pain Orientation Right   Pain Type Chronic pain   Pain Onset More than a month ago   Pain Frequency Constant   Aggravating Factors  standing and walking   Pain Relieving Factors sitting down/rest        There Ex:  Reviewed Seated heelslide stretch for heelcords, 3 reps x 30  seconds each leg.  Seated push ups from chair to promote forward lean on to bilateral lower extremities x 10 reps.  Minisquats at counter x 10 reps.    Neuro:  Heel/toe raises x 20 reps, with addition of hip movement for improved use of hip strategy.  Lateral weightshifting x 10 reps, then semi-tandem stance anterior/posterior weightshifting x 10 reps each leg position with UE support at counter.  Side step and weightshift x 15 reps, then back step and weightshift x 15 reps.  Forward step and weightshift x 15 reps.  Added lateral and anterior/posterior weightshifting to HEP today, with second set of 10 reps performed with written instructions.  At 6 inch step:  Alternating step taps x 15 reps, then consecutive step taps x 15 reps each leg with UE support, for improved single limb stance.                    PT Education - 05/08/14 1655    Education provided Yes   Education Details HEP-standing lateral weightshifting and forward/back weightshifting in stagger stance at counter   Person(s) Educated Patient;Spouse   Methods Explanation;Demonstration;Handout   Comprehension Verbalized understanding;Returned demonstration            PT Short Term Goals - 05/06/14 1256    PT SHORT TERM GOAL #1   Title Pt will perform HEP with supervision for improved strength, balance, and gait. (Target date 05/03/14)   Status Not Met   PT SHORT TERM GOAL #2   Title Pt will improve Berg Balance score to at least 25/56 for decreased fall risk.   Status Achieved   PT SHORT TERM GOAL #3   Title Pt will improve TUG score to less than or equal to 30 seconds for improved ADL participation in home.   Status Achieved   PT SHORT TERM GOAL #4   Title Pt will perform at least 8 of 10 reps of sit<>stand with minimal UE support from <18 inch surfaces, for improved transfer efficiency and safety.   Status Not Met           PT Long Term Goals - 04/03/14 1141    PT LONG TERM GOAL #1   Title Pt will  verbalize understanding of fall prevention techniques within home environment. (Target date 06/03/14)   Time 8   Period Weeks   Status New   PT LONG TERM GOAL #2   Title Pt will improve Berg Balance score to at least 35/56 for decreased fall risk.   Time 8   Period Weeks   Status New   PT LONG TERM GOAL #3   Title Pt will imrpove TUG score to less than or equal to 20 seconds for decreased fall risk.   Time 8   Period Weeks   Status New   PT LONG TERM GOAL #4   Title Pt will improve gait velocity to at least 1.8 ft/sec for improved gait efficiency and safety.   Time 8   Period Weeks   Status New   PT LONG TERM GOAL #5   Title Pt will verbalize continued community fitness plans upon D/C from PT.   Time 8   Period Weeks   Status New               Plan - 05/08/14 1656    Clinical Impression Statement Pt appears to have overal improved smoothness and ease of movement, no espisodes of posterior lean today.   Pt continues to prefer bilateral UE support for standing activities.  Pt will continue to benefit from further skilled PT to address balance, functional strength and gait.   Pt will benefit from skilled therapeutic intervention in order to improve on the following deficits Abnormal gait;Decreased activity tolerance;Decreased balance;Decreased mobility;Decreased strength;Difficulty walking;Postural dysfunction   Rehab Potential Good   PT Frequency 2x / week   PT Duration 8 weeks   PT Treatment/Interventions ADLs/Self Care Home Management;Gait training;Stair training;Functional mobility training;Neuromuscular re-education;Balance training;Therapeutic exercise;Therapeutic activities;Patient/family education   PT Next Visit Plan Review weightshifting in standing; work on single limb stance activities, progression of less UE support as able.   Consulted and Agree with Plan of Care Patient;Family member/caregiver   Family Member Consulted husband          G-Codes - 05/08/14  1700    Functional Assessment Tool Used Tug 25.19 seconds; Berg 30/56   Functional Limitation Mobility: Walking and moving around   Mobility: Walking and Moving Around Current Status (G8978) At least 20 percent but less than 40 percent impaired, limited or restricted   Mobility: Walking and Moving Around Goal Status (G8979) At least 20 percent but less than 40 percent impaired, limited or restricted      Problem List Patient   Active Problem List   Diagnosis Date Noted  . Dizziness of unknown cause 02/21/2014  . Left ventricular diastolic dysfunction with preserved systolic function 67/67/2094  . Essential hypertension 12/30/2013  . Transaminitis 12/24/2013  . Palpitations 12/23/2013  . Respiratory insufficiency 12/22/2013  . Normocytic anemia 12/21/2013  . Thrombocytopenia 12/21/2013  . Abnormal ECG 12/15/2013  . Abnormal involuntary movement 12/15/2013  . Colorectal polyps 12/15/2013  . Chest pain 12/15/2013  . Arteriosclerosis of coronary artery 12/15/2013  . Chronic kidney disease (CKD), stage III (moderate) 12/15/2013  . Corn 12/15/2013  . Diabetes 12/15/2013  . Breathing difficult 12/15/2013  . Acid reflux 12/15/2013  . HLD (hyperlipidemia) 12/15/2013  . BP (high blood pressure) 12/15/2013  . FOM (frequency of micturition) 12/15/2013  . Breast cancer, female 12/15/2013  . Fungal infection of nail 12/15/2013  . Osteopenia 12/15/2013  . Raynaud's syndrome 12/15/2013  . Restless leg 12/15/2013  . Gougerout-Sjoegren syndrome 12/15/2013  . Change in blood platelet count 12/15/2013  . Infection of urinary tract 12/15/2013  . Lower urinary tract infection 10/12/2013  . Idiopathic Parkinson's disease 09/06/2013  . Bladder spasm 09/05/2013  . Urgency of micturation 09/05/2013  . Dyspnea 06/15/2012  . Hyperglycemia 12/27/2011  . Sjogren's syndrome 12/17/2010  . TIA (transient ischemic attack) 12/17/2010  . Benign hypertensive heart disease without heart failure 09/01/2010   . Parkinson's disease 09/01/2010  . Hypothyroidism 09/01/2010  . Hypercholesterolemia 09/01/2010  . Chronic anemia 09/01/2010    Rayshun Kandler W. 05/08/2014, 5:01 PM  Mady Haagensen, PT 05/08/2014 5:05 PM Phone: [phone] Fax: [facility] [address] [place], [place], [address] Phone: [phone]   Fax:  [phone]

## 2014-05-08 NOTE — Therapy (Signed)
[facility] [address] [place], [place], [address] Phone: [phone]   Fax:  [phone]  Speech Language Pathology Evaluation  Patient Details  Name: Victoria Lindsey MRN: [id number] Date of Birth: 29-Jul-1925 Referring Provider:  [name], DO  Encounter Date: 05/08/2014      End of Session - 05/08/14 1101    Visit Number 1   Number of Visits 16   Date for SLP Re-Evaluation 07/07/14   SLP Start Time 1017   SLP Stop Time  1058   SLP Time Calculation (min) 41 min   Activity Tolerance Patient tolerated treatment well      Past Medical History  Diagnosis Date  . Hypertension   . Parkinson's disease   . Hyperlipidemia   . Dizziness   . Orthostatic hypotension   . Anemia   . Tremor   . Breast cancer     LEFT BREAST  . Kidney failure     stage 3  . Hypothyroidism   . Prediabetes   . TIA (transient ischemic attack)   . Hyperglycemia   . Left ventricular diastolic dysfunction   . Hypophonia     Past Surgical History  Procedure Laterality Date  . Cardiovascular stress test  06/2007    NORMAL  . Breast lumpectomy  2010  . Cholecystectomy  1994  . Tonsillectomy    . Nasal sinus surgery      submucous resection late 1950s  . Cataract extraction  2005,2006    Dr.Devonzo    There were no vitals taken for this visit.  Visit Diagnosis: Hypokinetic Parkinsonian dysphonia      Subjective Assessment - 05/08/14 1029    Symptoms Pt acknowledges people have difficulty understanding her, immediately, when SLP asked.           SLP Evaluation [facility] - 05/08/14 1030    Pain Assessment   Currently in Pain? Yes   Pain Score 5    Pain Location Hip   Pain Orientation Right   Pain Type Chronic pain   Pain Onset More than a month ago   Pain Frequency Constant   Pain Relieving Factors Sitting down   Effect of Pain on Daily Activities walking, standing up straight aggravates   General Information   HPI  Pt noticed Park[place]on's symptoms about 2010. Diagnosed approx same time.   Mobility Status --  rolling walker   Cognition   Overall Cognitive Status Within Functional Limits for tasks assessed  Pt reports memory deficits   Expression   Primary Mode of Expression Verbal   Motor Speech   Overall Motor Speech Impaired   Respiration Impaired   Intelligibility Intelligibility reduced   Conversation 75-100% accurate   Effective Techniques Increased vocal intensity   Phonation Impaired   Volume Soft       Eight minutes of conversational speech was reduced today, at average 68dB (WNL= average 70-72dB) initially and after 5 minutes loudness decr'd to 66dB. A range of 60 to 71dB was measured, when a sound level meter was placed 30 cm away from pt's mouth. Overall intelligibility for this listener in a quiet environment was approx 95%. Pt reports her husband has much more difficulty than that with understanding her and she has to repeat herself frequently for family and friends. Production of loud /a/ averaged 80dB (range of 82 to 72) and min-mod cues occasionally needed for loudness. Pt rated effort level at 8 or 9/10 for production of loud /a/ (10=maximal effort).  Pt increased her phonation time from 8 to 11 seconds with loud /a/.  Oral motor assessment revealed WNL lingual ROM and WNL lingual strength on lt, rt lingual strength was minimally reduced. Labial ROM was WNL and strength was WNL. Velar ROM appeared WNL. Alternating motion tasks of lingual and labial structures were generally uncoordinated. Pt's repeated production of "Buttercup" in 5 seconds was degraded after 4 repetitions.  In simple conversation tasks following the evaluation, pt was asked to use the same amount of effort as with loud /a/. Loudness average with this increased effort was 70dB (range of 74 to 63) with min-mod A occasionally for increased loudness. Pt would benefit from skilled ST in order to improve speech intelligibility  and pt's QOL.           SLP Short Term Goals - June 06, 2014 1103    SLP SHORT TERM GOAL #1   Title pt will maintain average of loud /a/ at 80dB for 5 sessions   Time 4   Period Weeks   Status New   SLP SHORT TERM GOAL #2   Title pt will produce 16/20 sentences with average 70dB   Time 4   Period Weeks   Status New   SLP SHORT TERM GOAL #3   Title pt will improve loudness in 5 minutes simple conversation to 70dB with rare min A   Time 4   Period Weeks   Status New          SLP Long Term Goals - 06-Jun-2014 1105    SLP LONG TERM GOAL #1   Title pt will maintain average loudness of 80dB with loud /a/ over 7 sessions   Time 8   Period Weeks   Status New   SLP LONG TERM GOAL #2   Title pt will improve loudness to 70dB in 7 minutes simple/mod complex conversation with rare min A   Time 8   Period Weeks   Status New   SLP LONG TERM GOAL #3   Title pt will report feeling abdominal movement necessary for louder speech in simple conversation    Time 8   Period Weeks   Status New          Plan - 2014-06-06 1102    Clinical Impression Statement Pt presents with mod hypokinetic parkinsonian dysarthria c/b reduced breath support leading to low volume complicating speech intelligibility with family and friends. Skilled ST is recommended to improve pt's volume and intelligibility.   Speech Therapy Frequency 2x / week   Duration --  8 weeks   Treatment/Interventions Internal/external aids;Functional tasks;Cueing hierarchy;Patient/family education;Compensatory strategies;SLP instruction and feedback   Potential to Achieve Goals Good          G-Codes - 06/06/14 1108    Functional Assessment Tool Used noms   Functional Limitations Motor speech   Motor Speech Current Status [phone]) At least 20 percent but less than 40 percent impaired, limited or restricted   Motor Speech Goal Status ([id number]) At least 1 percent but less than 20 percent impaired, limited or restricted       Problem List Patient Active Problem List   Diagnosis Date Noted  . Dizziness of unknown cause 02/21/2014  . Left ventricular diastolic dysfunction with preserved systolic function 78/46/9629  . Essential hypertension 12/30/2013  . Transaminitis 12/24/2013  . Palpitations 12/23/2013  . Respiratory insufficiency 12/22/2013  . Normocytic anemia 12/21/2013  . Thrombocytopenia 12/21/2013  . Abnormal ECG 12/15/2013  . Abnormal involuntary movement 12/15/2013  . Colorectal  polyps 12/15/2013  . Chest pain 12/15/2013  . Arteriosclerosis of coronary artery 12/15/2013  . Chronic kidney disease (CKD), stage III (moderate) 12/15/2013  . Corn 12/15/2013  . Diabetes 12/15/2013  . Breathing difficult 12/15/2013  . Acid reflux 12/15/2013  . HLD (hyperlipidemia) 12/15/2013  . BP (high blood pressure) 12/15/2013  . FOM (frequency of micturition) 12/15/2013  . Breast cancer, female 12/15/2013  . Fungal infection of nail 12/15/2013  . Osteopenia 12/15/2013  . Raynaud's syndrome 12/15/2013  . Restless leg 12/15/2013  . Gougerout-Sjoegren syndrome 12/15/2013  . Change in blood platelet count 12/15/2013  . Infection of urinary tract 12/15/2013  . Lower urinary tract infection 10/12/2013  . Idiopathic Parkinson's disease 09/06/2013  . Bladder spasm 09/05/2013  . Urgency of micturation 09/05/2013  . Dyspnea 06/15/2012  . Hyperglycemia 12/27/2011  . Sjogren's syndrome 12/17/2010  . TIA (transient ischemic attack) 12/17/2010  . Benign hypertensive heart disease without heart failure 09/01/2010  . Parkinson's disease 09/01/2010  . Hypothyroidism 09/01/2010  . Hypercholesterolemia 09/01/2010  . Chronic anemia 09/01/2010    Garald Balding , SLP  05/08/2014, 11:21 AM  [facility] [address] [place], [place], [address] Phone: [phone]   Fax:  [phone]

## 2014-05-08 NOTE — Patient Instructions (Signed)
Instructions given for the following:  Standing lateral weightshifting at counter 10 reps, holding to counter for supervision  Standing with feet semi-tandem with forward/back weightshifting x 10 reps, holding to counter for supervision.  To be performed 1-2 times per day.  Make sure to keep feet wide

## 2014-05-10 ENCOUNTER — Ambulatory Visit: Payer: Medicare Other

## 2014-05-10 DIAGNOSIS — R279 Unspecified lack of coordination: Secondary | ICD-10-CM | POA: Diagnosis not present

## 2014-05-10 DIAGNOSIS — G20 Parkinson's disease: Secondary | ICD-10-CM

## 2014-05-10 DIAGNOSIS — R490 Dysphonia: Secondary | ICD-10-CM

## 2014-05-10 NOTE — Therapy (Signed)
[facility] [address] [place], [place], [address] Phone: [phone]   Fax:  [phone]  Speech Language Pathology Treatment  Patient Details  Name: Victoria Lindsey MRN: [id number] Date of Birth: October 24, 1925 Referring Provider:  [name], DO  Encounter Date: 05/10/2014      End of Session - 05/10/14 1532    Visit Number 2   Number of Visits 16   Date for SLP Re-Evaluation 07/07/14   SLP Start Time 1451   SLP Stop Time  1531   SLP Time Calculation (min) 40 min   Activity Tolerance Patient tolerated treatment well      Past Medical History  Diagnosis Date  . Hypertension   . Parkinson's disease   . Hyperlipidemia   . Dizziness   . Orthostatic hypotension   . Anemia   . Tremor   . Breast cancer     LEFT BREAST  . Kidney failure     stage 3  . Hypothyroidism   . Prediabetes   . TIA (transient ischemic attack)   . Hyperglycemia   . Left ventricular diastolic dysfunction   . Hypophonia     Past Surgical History  Procedure Laterality Date  . Cardiovascular stress test  06/2007    NORMAL  . Breast lumpectomy  2010  . Cholecystectomy  1994  . Tonsillectomy    . Nasal sinus surgery      submucous resection late 1950s  . Cataract extraction  2005,2006    Dr.Devonzo    There were no vitals taken for this visit.  Visit Diagnosis: Hypokinetic Parkinsonian dysphonia      Subjective Assessment - 05/10/14 1500    Symptoms Pt reported bad SE for clindamycin - no longer taking. She has to pick up the new prescription today.             ADULT SLP TREATMENT - 05/10/14 1500    General Information   Behavior/Cognition Alert;Cooperative;Pleasant mood   Treatment Provided   Treatment provided Cognitive-Linquistic   Pain Assessment   Pain Assessment No/denies pain   Cognitive-Linquistic Treatment   Treatment focused on Dysarthria   Skilled Treatment Loud /a/ today average 80dB with usual  mod A for remaining loud. Divergent naming - pt req'd occasional min A for normal loudness. Reading short paragraphs, pt maintained loudness average 71dB with usual min A to take a WNL breath. PT's conversation was measured at a range between 62-67dB.   Assessment / Recommendations / Plan   Plan Continue with current plan of care   Progression Toward Goals   Progression toward goals Progressing toward goals            SLP Short Term Goals - 05/10/14 1534    SLP SHORT TERM GOAL #1   Title pt will maintain average of loud /a/ at 80dB for 5 sessions   Time 4   Period Weeks   Status On-going   SLP SHORT TERM GOAL #2   Title pt will produce 16/20 sentences with average 70dB   Time 4   Period Weeks   Status On-going   SLP SHORT TERM GOAL #3   Title pt will improve loudness in 5 minutes simple conversation to 70dB with rare min A   Time 4   Period Weeks   Status On-going          SLP Long Term Goals - 05/10/14 1535    SLP LONG TERM GOAL #1   Title pt  will maintain average loudness of 80dB with loud /a/ over 7 sessions   Time 8   Period Weeks   Status On-going   SLP LONG TERM GOAL #2   Title pt will improve loudness to 70dB in 7 minutes simple/mod complex conversation with rare min A   Time 8   Period Weeks   Status On-going   SLP LONG TERM GOAL #3   Title pt will report feeling abdominal movement necessary for louder speech in simple conversation    Time 8   Period Weeks   Status On-going          Plan - 05/10/14 1533    Clinical Impression Statement Pt is learning to use abdominal musculature to assist loudness in sentences - reading and spontaneous. Skilled ST remains necessary to cont in order to carryover louder speech to higher linguistic levels and other situations.   Speech Therapy Frequency 2x / week   Duration --  8 weeks   Treatment/Interventions Internal/external aids;Functional tasks;Cueing hierarchy;Patient/family education;Compensatory strategies;SLP  instruction and feedback   Potential to Achieve Goals Good        Problem List Patient Active Problem List   Diagnosis Date Noted  . Dizziness of unknown cause 02/21/2014  . Left ventricular diastolic dysfunction with preserved systolic function 45/36/4680  . Essential hypertension 12/30/2013  . Transaminitis 12/24/2013  . Palpitations 12/23/2013  . Respiratory insufficiency 12/22/2013  . Normocytic anemia 12/21/2013  . Thrombocytopenia 12/21/2013  . Abnormal ECG 12/15/2013  . Abnormal involuntary movement 12/15/2013  . Colorectal polyps 12/15/2013  . Chest pain 12/15/2013  . Arteriosclerosis of coronary artery 12/15/2013  . Chronic kidney disease (CKD), stage III (moderate) 12/15/2013  . Corn 12/15/2013  . Diabetes 12/15/2013  . Breathing difficult 12/15/2013  . Acid reflux 12/15/2013  . HLD (hyperlipidemia) 12/15/2013  . BP (high blood pressure) 12/15/2013  . FOM (frequency of micturition) 12/15/2013  . Breast cancer, female 12/15/2013  . Fungal infection of nail 12/15/2013  . Osteopenia 12/15/2013  . Raynaud's syndrome 12/15/2013  . Restless leg 12/15/2013  . Gougerout-Sjoegren syndrome 12/15/2013  . Change in blood platelet count 12/15/2013  . Infection of urinary tract 12/15/2013  . Lower urinary tract infection 10/12/2013  . Idiopathic Parkinson's disease 09/06/2013  . Bladder spasm 09/05/2013  . Urgency of micturation 09/05/2013  . Dyspnea 06/15/2012  . Hyperglycemia 12/27/2011  . Sjogren's syndrome 12/17/2010  . TIA (transient ischemic attack) 12/17/2010  . Benign hypertensive heart disease without heart failure 09/01/2010  . Parkinson's disease 09/01/2010  . Hypothyroidism 09/01/2010  . Hypercholesterolemia 09/01/2010  . Chronic anemia 09/01/2010    [facility], SLP 05/10/2014, 3:35 PM  [facility] [address] [place], [place], [address] Phone: [phone]   Fax:  [phone]

## 2014-05-10 NOTE — Addendum Note (Signed)
Addended by: Garald Balding B on: 05/10/2014 02:59 PM   Modules accepted: Orders

## 2014-05-10 NOTE — Patient Instructions (Signed)
Please complete 5 loud "ah"s twice a day.

## 2014-05-13 ENCOUNTER — Ambulatory Visit: Payer: Medicare Other

## 2014-05-13 ENCOUNTER — Ambulatory Visit: Payer: Medicare Other | Admitting: Physical Therapy

## 2014-05-13 ENCOUNTER — Ambulatory Visit: Payer: Medicare Other | Admitting: Occupational Therapy

## 2014-05-13 ENCOUNTER — Encounter: Payer: Self-pay | Admitting: Occupational Therapy

## 2014-05-13 DIAGNOSIS — R279 Unspecified lack of coordination: Secondary | ICD-10-CM | POA: Diagnosis not present

## 2014-05-13 DIAGNOSIS — R490 Dysphonia: Secondary | ICD-10-CM

## 2014-05-13 DIAGNOSIS — R269 Unspecified abnormalities of gait and mobility: Secondary | ICD-10-CM

## 2014-05-13 DIAGNOSIS — R531 Weakness: Secondary | ICD-10-CM

## 2014-05-13 DIAGNOSIS — R258 Other abnormal involuntary movements: Secondary | ICD-10-CM

## 2014-05-13 DIAGNOSIS — R29898 Other symptoms and signs involving the musculoskeletal system: Secondary | ICD-10-CM

## 2014-05-13 DIAGNOSIS — G20 Parkinson's disease: Secondary | ICD-10-CM

## 2014-05-13 DIAGNOSIS — R278 Other lack of coordination: Secondary | ICD-10-CM

## 2014-05-13 DIAGNOSIS — R293 Abnormal posture: Secondary | ICD-10-CM

## 2014-05-13 NOTE — Therapy (Signed)
[facility] [address] [place], [place], [address] Phone: [phone]   Fax:  [phone]  Physical Therapy Treatment  Patient Details  Name: Victoria Lindsey MRN: [id number] Date of Birth: Jan 12, 1926 Referring Provider:  Elba Barman, MD  Encounter Date: 05/13/2014      PT End of Session - 05/13/14 1229    Visit Number 11   Number of Visits 17   Date for PT Re-Evaluation 06/03/14   PT Start Time 7253   PT Stop Time 1233   PT Time Calculation (min) 38 min   Equipment Utilized During Treatment Gait belt   Activity Tolerance Patient tolerated treatment well   Behavior During Therapy [facility] for tasks assessed/performed      Past Medical History  Diagnosis Date  . Hypertension   . Parkinson's disease   . Hyperlipidemia   . Dizziness   . Orthostatic hypotension   . Anemia   . Tremor   . Breast cancer     LEFT BREAST  . Kidney failure     stage 3  . Hypothyroidism   . Prediabetes   . TIA (transient ischemic attack)   . Hyperglycemia   . Left ventricular diastolic dysfunction   . Hypophonia     Past Surgical History  Procedure Laterality Date  . Cardiovascular stress test  06/2007    NORMAL  . Breast lumpectomy  2010  . Cholecystectomy  1994  . Tonsillectomy    . Nasal sinus surgery      submucous resection late 1950s  . Cataract extraction  2005,2006    Dr.Devonzo    There were no vitals taken for this visit.  Visit Diagnosis:  Bradykinesia  Rigidity  Abnormality of gait      Subjective Assessment - 05/13/14 1148    Symptoms No falls, some hip pain today   Currently in Pain? Yes   Pain Score 7    Pain Location Hip   Pain Orientation Right   Pain Descriptors / Indicators Aching   Pain Type Chronic pain   Pain Frequency Constant   Aggravating Factors  standing and walking   Pain Relieving Factors sitting down and resting       Neuro Re-education: Reviewed standing exercises  given last visit:  Pt performs lateral weightshifting then anterior/posterior weightshifting with stagger stance x 12 reps each with UE support; initial cues provided for foot placement.  Side step and weightshift x 15 reps each leg, consecutive, then 10 reps with alternating side step and weightshifting; then back step and weightshift x 15 reps consecutive with UE support.  Heel/toe raises x 10 reps for anterior/posterior weightshifting.  Sit to stand training with chair push-ups x 10 reps to improve forward weightshift.  Sit<>stand training x 10 reps with cues for increased forward lean and to boost momentum forward up to stand, from 18 inch chair height.  Single limb stance activities at counter using balance disks-alternating step taps, then alternating step-over obstacle with bilateral UE support x 10 reps.  PT provides min guard assistance.  At 6 inch step:  Alternating step taps to 6 inch then 12 inch step with UE support, then forward step ups x 10 reps with UE support.  At counter-four square step activity 2 sets with UE support and cues for increased step length.  Pt needs cues throughout session for improved posture.  PT Short Term Goals - 05/06/14 1256    PT SHORT TERM GOAL #1   Title Pt will perform HEP with supervision for improved strength, balance, and gait. (Target date 05/03/14)   Status Not Met   PT SHORT TERM GOAL #2   Title Pt will improve Berg Balance score to at least 25/56 for decreased fall risk.   Status Achieved   PT SHORT TERM GOAL #3   Title Pt will improve TUG score to less than or equal to 30 seconds for improved ADL participation in home.   Status Achieved   PT SHORT TERM GOAL #4   Title Pt will perform at least 8 of 10 reps of sit<>stand with minimal UE support from <18 inch surfaces, for improved transfer efficiency and safety.   Status Not Met           PT Long Term Goals - 04/03/14 1141    PT LONG TERM GOAL #1    Title Pt will verbalize understanding of fall prevention techniques within home environment. (Target date 06/03/14)   Time 8   Period Weeks   Status New   PT LONG TERM GOAL #2   Title Pt will improve Berg Balance score to at least 35/56 for decreased fall risk.   Time 8   Period Weeks   Status New   PT LONG TERM GOAL #3   Title Pt will imrpove TUG score to less than or equal to 20 seconds for decreased fall risk.   Time 8   Period Weeks   Status New   PT LONG TERM GOAL #4   Title Pt will improve gait velocity to at least 1.8 ft/sec for improved gait efficiency and safety.   Time 8   Period Weeks   Status New   PT LONG TERM GOAL #5   Title Pt will verbalize continued community fitness plans upon D/C from PT.   Time 8   Period Weeks   Status New               Plan - 05/13/14 1235    Clinical Impression Statement Pt has episodes of stronger posterior lean with transfers and with standing today.  She is able to lessen UE support briefly with single limb stance activities   Pt will benefit from skilled therapeutic intervention in order to improve on the following deficits Abnormal gait;Decreased activity tolerance;Decreased balance;Decreased mobility;Decreased strength;Difficulty walking;Postural dysfunction   Rehab Potential Good   PT Frequency 2x / week   PT Duration 8 weeks  wk 6 of 8   PT Treatment/Interventions ADLs/Self Care Home Management;Gait training;Stair training;Functional mobility training;Neuromuscular re-education;Balance training;Therapeutic exercise;Therapeutic activities;Patient/family education   PT Next Visit Plan Standing weightshifting, compliant surface activites, single limb stance activities, progression of less UE support as able.        Problem List Patient Active Problem List   Diagnosis Date Noted  . Dizziness of unknown cause 02/21/2014  . Left ventricular diastolic dysfunction with preserved systolic function 54/65/0354  . Essential  hypertension 12/30/2013  . Transaminitis 12/24/2013  . Palpitations 12/23/2013  . Respiratory insufficiency 12/22/2013  . Normocytic anemia 12/21/2013  . Thrombocytopenia 12/21/2013  . Abnormal ECG 12/15/2013  . Abnormal involuntary movement 12/15/2013  . Colorectal polyps 12/15/2013  . Chest pain 12/15/2013  . Arteriosclerosis of coronary artery 12/15/2013  . Chronic kidney disease (CKD), stage III (moderate) 12/15/2013  . Corn 12/15/2013  . Diabetes 12/15/2013  . Breathing difficult 12/15/2013  . Acid reflux 12/15/2013  .  HLD (hyperlipidemia) 12/15/2013  . BP (high blood pressure) 12/15/2013  . FOM (frequency of micturition) 12/15/2013  . Breast cancer, female 12/15/2013  . Fungal infection of nail 12/15/2013  . Osteopenia 12/15/2013  . Raynaud's syndrome 12/15/2013  . Restless leg 12/15/2013  . Gougerout-Sjoegren syndrome 12/15/2013  . Change in blood platelet count 12/15/2013  . Infection of urinary tract 12/15/2013  . Lower urinary tract infection 10/12/2013  . Idiopathic Parkinson's disease 09/06/2013  . Bladder spasm 09/05/2013  . Urgency of micturation 09/05/2013  . Dyspnea 06/15/2012  . Hyperglycemia 12/27/2011  . Sjogren's syndrome 12/17/2010  . TIA (transient ischemic attack) 12/17/2010  . Benign hypertensive heart disease without heart failure 09/01/2010  . Parkinson's disease 09/01/2010  . Hypothyroidism 09/01/2010  . Hypercholesterolemia 09/01/2010  . Chronic anemia 09/01/2010    , W. 05/13/2014, 12:55 PM  [facility] [address] [place], [place], [address] Phone: [phone]   Fax:  [phone]     

## 2014-05-13 NOTE — Therapy (Signed)
[facility] [address] [place], [place], [address] Phone: [phone]   Fax:  [phone]  Occupational Therapy Treatment  Patient Details  Name: Victoria Lindsey MRN: [id number] Date of Birth: 09/23/1925 Referring Provider:  Elba Barman, MD  Encounter Date: 05/13/2014      OT End of Session - 05/13/14 1110    Visit Number 11   Number of Visits 17   Date for OT Re-Evaluation 06/01/14   Authorization Type Medicare   Authorization Time Period G code needed   Authorization - Visit Number 11   Authorization - Number of Visits 20   OT Start Time 1103   OT Stop Time 1145   OT Time Calculation (min) 42 min   Activity Tolerance Patient tolerated treatment well      Past Medical History  Diagnosis Date  . Hypertension   . Parkinson's disease   . Hyperlipidemia   . Dizziness   . Orthostatic hypotension   . Anemia   . Tremor   . Breast cancer     LEFT BREAST  . Kidney failure     stage 3  . Hypothyroidism   . Prediabetes   . TIA (transient ischemic attack)   . Hyperglycemia   . Left ventricular diastolic dysfunction   . Hypophonia     Past Surgical History  Procedure Laterality Date  . Cardiovascular stress test  06/2007    NORMAL  . Breast lumpectomy  2010  . Cholecystectomy  1994  . Tonsillectomy    . Nasal sinus surgery      submucous resection late 1950s  . Cataract extraction  2005,2006    Dr.Devonzo    There were no vitals taken for this visit.  Visit Diagnosis:  Bradykinesia  Rigidity  Decreased coordination  Posture abnormality  Weakness      Subjective Assessment - 05/13/14 1109    Symptoms doing good, pt reports dressing with assist for socks but has sock aide (encouraged pt to use)   Currently in Pain? No/denies                 OT Treatments/Exercises (OP) - 05/13/14 0001    Fine Motor Coordination   Purdue Pegboard completed with each hand with mind  difficulty for coordination   Neurological Re-education Exercises   Reciprocal Movements Arm bike x63mn level 1 for reciprocal movements with min cues to maintain 30rpms, pt able to maintain 25-29rpms   Other Exercises 1 PWR! up, rock, twist in supine with min-mod cues (verbal/tactile)   Functional Reaching Activities   Mid Level Functional reaching laterally and across body with trunk rotation/wt. shift to flip cards with focus on supination and finger extension/big movements with min v.c.  Functional reaching laterally and across body with trunk rotation to toss scarves with each hand with min-mod v.c. for big movements (particularly with LUE).                  OT Short Term Goals - 05/02/14 1517    OT SHORT TERM GOAL #1   Title I with inital HEP.   Baseline ck 05/03/14   Time 4   Period Weeks   Status Achieved   OT SHORT TERM GOAL #2   Title Pt / family will verbalize understanding of adapted strategies for ADLS/ IADLS.   Time 4   Period Weeks   Status Achieved   OT SHORT TERM GOAL #3   Title Pt will demonstrate improved indpendence  with dressing as evidenced by decreasing PPT #4 to 45 secs or less.   Time 4   Period Weeks   Status On-going  not met 04/29/14:  53.66sec   OT SHORT TERM GOAL #4   Title Pt will demonstrate ability to write 4 sentences with 100 % legibility and minimal decrease in letter size.   Time 4   Period Weeks   Status On-going  Not met 04/29/14--95% legibility and min-mod decrease in size           OT Long Term Goals - 04/03/14 1737    OT LONG TERM GOAL #1   Title Pt will perfom dressing with supervision/set up.   Baseline ck 06/01/14   Time 8   Period Weeks   Status New   OT LONG TERM GOAL #2   Title Pt will perform light home management/ snack prep with min A.   Time 8   Period Weeks   Status New   OT LONG TERM GOAL #3   Title Pt will demonstrate improved standing balance for ADLS as evidenced by increasing standing functional reach  by 3 inches bilaterally.   Baseline RUE 7.5 inches, LUE 6 inches   Time 8   Period Weeks   Status New   OT LONG TERM GOAL #4   Title Pt will demonstrate increased ease with feeding as evidenced by decreasing PPT #2 to 10 secs or less.   Baseline 14.66 secs   Time 8   Period Weeks   Status New   OT LONG TERM GOAL #5   Title Pt/ family will verbalize understanding of compensatory strategies for cognition and ways to keep thinking skills sharp   Time 8   Period Weeks   Status New               Plan - 05/13/14 1302    Clinical Impression Statement Pt progressing with ADLs with less help per pt report.   Rehab Potential Good   Clinical Impairments Affecting Rehab Potential decreased strength, decreased coordination, bradykinesia   Plan big amplitude movements, ADLs   Consulted and Agree with Plan of Care Patient;Family member/caregiver   Family Member Consulted (husband present)        Problem List Patient Active Problem List   Diagnosis Date Noted  . Dizziness of unknown cause 02/21/2014  . Left ventricular diastolic dysfunction with preserved systolic function 41/96/2229  . Essential hypertension 12/30/2013  . Transaminitis 12/24/2013  . Palpitations 12/23/2013  . Respiratory insufficiency 12/22/2013  . Normocytic anemia 12/21/2013  . Thrombocytopenia 12/21/2013  . Abnormal ECG 12/15/2013  . Abnormal involuntary movement 12/15/2013  . Colorectal polyps 12/15/2013  . Chest pain 12/15/2013  . Arteriosclerosis of coronary artery 12/15/2013  . Chronic kidney disease (CKD), stage III (moderate) 12/15/2013  . Corn 12/15/2013  . Diabetes 12/15/2013  . Breathing difficult 12/15/2013  . Acid reflux 12/15/2013  . HLD (hyperlipidemia) 12/15/2013  . BP (high blood pressure) 12/15/2013  . FOM (frequency of micturition) 12/15/2013  . Breast cancer, female 12/15/2013  . Fungal infection of nail 12/15/2013  . Osteopenia 12/15/2013  . Raynaud's syndrome 12/15/2013  .  Restless leg 12/15/2013  . Gougerout-Sjoegren syndrome 12/15/2013  . Change in blood platelet count 12/15/2013  . Infection of urinary tract 12/15/2013  . Lower urinary tract infection 10/12/2013  . Idiopathic Parkinson's disease 09/06/2013  . Bladder spasm 09/05/2013  . Urgency of micturation 09/05/2013  . Dyspnea 06/15/2012  . Hyperglycemia 12/27/2011  . Sjogren's syndrome 12/17/2010  .  TIA (transient ischemic attack) 12/17/2010  . Benign hypertensive heart disease without heart failure 09/01/2010  . Parkinson's disease 09/01/2010  . Hypothyroidism 09/01/2010  . Hypercholesterolemia 09/01/2010  . Chronic anemia 09/01/2010    [facility] 05/13/2014, 1:04 PM  [facility] [address] [place], [place], [address] Phone: [phone]   Fax:  [place], OTR/L 05/13/2014 1:04 PM

## 2014-05-13 NOTE — Therapy (Signed)
[facility] [address] [place], [place], [address] Phone: [phone]   Fax:  [phone]  Speech Language Pathology Treatment  Patient Details  Name: Victoria Lindsey MRN: [id number] Date of Birth: 04-03-1925 Referring Provider:  [name], DO  Encounter Date: 05/13/2014      End of Session - 05/13/14 1058    Visit Number 3   Number of Visits 16   Date for SLP Re-Evaluation 07/07/14   SLP Start Time 15   SLP Stop Time  1100   SLP Time Calculation (min) 40 min   Activity Tolerance Patient tolerated treatment well      Past Medical History  Diagnosis Date  . Hypertension   . Parkinson's disease   . Hyperlipidemia   . Dizziness   . Orthostatic hypotension   . Anemia   . Tremor   . Breast cancer     LEFT BREAST  . Kidney failure     stage 3  . Hypothyroidism   . Prediabetes   . TIA (transient ischemic attack)   . Hyperglycemia   . Left ventricular diastolic dysfunction   . Hypophonia     Past Surgical History  Procedure Laterality Date  . Cardiovascular stress test  06/2007    NORMAL  . Breast lumpectomy  2010  . Cholecystectomy  1994  . Tonsillectomy    . Nasal sinus surgery      submucous resection late 1950s  . Cataract extraction  2005,2006    Dr.Devonzo    There were no vitals taken for this visit.  Visit Diagnosis: Hypokinetic Parkinsonian dysphonia      Subjective Assessment - 05/13/14 1027    Symptoms "They (loud /a/) made my throat hurt a little."             ADULT SLP TREATMENT - 05/13/14 1027    General Information   Behavior/Cognition Alert;Cooperative;Pleasant mood   Treatment Provided   Treatment provided Cognitive-Linquistic   Pain Assessment   Pain Assessment 0-10   Pain Score 7    Pain Location rt hip   Pain Descriptors / Indicators Nagging   Pain Intervention(s) Monitored during session   Cognitive-Linquistic Treatment   Treatment focused on  Dysarthria   Skilled Treatment Loud /a/ with mod A usually for appropriate pitch (too hight), abdominal push and not laryngeal push. Pt with excessive phlegm in throat - pt continually clearing throat. SLP cued pt not to do so and educated re: what happens in larynx with excessive throat clearing and encouraged hard swallow and/or 1-2 additonal effortful swallows following sips H2O. In sentence tasks today pt req'd min-mod A usually for abdominal push as well as full breath.    Assessment / Recommendations / Plan   Plan Continue with current plan of care   Progression Toward Goals   Progression toward goals Progressing toward goals          SLP Education - 05/13/14 1057    Education provided Yes   Education Details throat clearing - hard swallow or sip and 1-2 hard swallows afterwards   Person(s) Educated Patient   Methods Explanation   Comprehension Verbalized understanding;Returned demonstration;Verbal cues required;Need further instruction          SLP Short Term Goals - 05/13/14 1059    SLP SHORT TERM GOAL #1   Title pt will maintain average of loud /a/ at 80dB for 5 sessions   Time 3   Period Weeks   Status On-going  SLP SHORT TERM GOAL #2   Title pt will produce 16/20 sentences with average 70dB   Time 3   Period Weeks   Status On-going   SLP SHORT TERM GOAL #3   Title pt will improve loudness in 5 minutes simple conversation to 70dB with rare min A   Time 3   Period Weeks   Status On-going          SLP Long Term Goals - 05/13/14 1059    SLP LONG TERM GOAL #1   Title pt will maintain average loudness of 80dB with loud /a/ over 7 sessions   Time 7   Period Weeks   Status On-going   SLP LONG TERM GOAL #2   Title pt will improve loudness to 70dB in 7 minutes simple/mod complex conversation with rare min A   Time 7   Period Weeks   Status On-going   SLP LONG TERM GOAL #3   Title pt will report feeling abdominal movement necessary for louder speech in simple  conversation    Time 7   Period Weeks   Status On-going          Plan - 05/13/14 1058    Clinical Impression Statement Pt is learning to use abdominal musculature to assist loudness in sentences spontaneously. Skilled ST remains necessary to carryover louder speech to higher linguistic levels and other situations.   Speech Therapy Frequency 2x / week   Duration --  7 weeks   Treatment/Interventions Internal/external aids;Functional tasks;Cueing hierarchy;Patient/family education;Compensatory strategies;SLP instruction and feedback   Potential to Achieve Goals Good   Potential Considerations Ability to learn/carryover information        Problem List Patient Active Problem List   Diagnosis Date Noted  . Dizziness of unknown cause 02/21/2014  . Left ventricular diastolic dysfunction with preserved systolic function 11/57/2620  . Essential hypertension 12/30/2013  . Transaminitis 12/24/2013  . Palpitations 12/23/2013  . Respiratory insufficiency 12/22/2013  . Normocytic anemia 12/21/2013  . Thrombocytopenia 12/21/2013  . Abnormal ECG 12/15/2013  . Abnormal involuntary movement 12/15/2013  . Colorectal polyps 12/15/2013  . Chest pain 12/15/2013  . Arteriosclerosis of coronary artery 12/15/2013  . Chronic kidney disease (CKD), stage III (moderate) 12/15/2013  . Corn 12/15/2013  . Diabetes 12/15/2013  . Breathing difficult 12/15/2013  . Acid reflux 12/15/2013  . HLD (hyperlipidemia) 12/15/2013  . BP (high blood pressure) 12/15/2013  . FOM (frequency of micturition) 12/15/2013  . Breast cancer, female 12/15/2013  . Fungal infection of nail 12/15/2013  . Osteopenia 12/15/2013  . Raynaud's syndrome 12/15/2013  . Restless leg 12/15/2013  . Gougerout-Sjoegren syndrome 12/15/2013  . Change in blood platelet count 12/15/2013  . Infection of urinary tract 12/15/2013  . Lower urinary tract infection 10/12/2013  . Idiopathic Parkinson's disease 09/06/2013  . Bladder spasm  09/05/2013  . Urgency of micturation 09/05/2013  . Dyspnea 06/15/2012  . Hyperglycemia 12/27/2011  . Sjogren's syndrome 12/17/2010  . TIA (transient ischemic attack) 12/17/2010  . Benign hypertensive heart disease without heart failure 09/01/2010  . Parkinson's disease 09/01/2010  . Hypothyroidism 09/01/2010  . Hypercholesterolemia 09/01/2010  . Chronic anemia 09/01/2010    Garald Balding, SLP 05/13/2014, 11:00 AM  [facility] [address] [place], [place], [address] Phone: [phone]   Fax:  [phone]

## 2014-05-13 NOTE — Patient Instructions (Signed)
Continue with loud /a/ at home with "hey" beforehand.

## 2014-05-16 ENCOUNTER — Ambulatory Visit: Payer: Medicare Other | Admitting: Occupational Therapy

## 2014-05-16 ENCOUNTER — Ambulatory Visit: Payer: Medicare Other

## 2014-05-16 DIAGNOSIS — R279 Unspecified lack of coordination: Principal | ICD-10-CM

## 2014-05-16 DIAGNOSIS — R531 Weakness: Secondary | ICD-10-CM

## 2014-05-16 DIAGNOSIS — R278 Other lack of coordination: Secondary | ICD-10-CM

## 2014-05-16 DIAGNOSIS — R29898 Other symptoms and signs involving the musculoskeletal system: Secondary | ICD-10-CM

## 2014-05-16 DIAGNOSIS — R258 Other abnormal involuntary movements: Secondary | ICD-10-CM

## 2014-05-16 NOTE — Therapy (Signed)
[facility] [address] [place], [place], [address] Phone: [phone]   Fax:  [phone]  Physical Therapy Treatment  Patient Details  Name: Victoria Lindsey MRN: [id number] Date of Birth: 1925/09/05 Referring Provider:  Elba Barman, MD  Encounter Date: 05/16/2014      PT End of Session - 05/16/14 1154    Visit Number 12   Number of Visits 17   Date for PT Re-Evaluation 06/03/14   PT Start Time 1102   PT Stop Time 1145   PT Time Calculation (min) 43 min      Past Medical History  Diagnosis Date  . Hypertension   . Parkinson's disease   . Hyperlipidemia   . Dizziness   . Orthostatic hypotension   . Anemia   . Tremor   . Breast cancer     LEFT BREAST  . Kidney failure     stage 3  . Hypothyroidism   . Prediabetes   . TIA (transient ischemic attack)   . Hyperglycemia   . Left ventricular diastolic dysfunction   . Hypophonia     Past Surgical History  Procedure Laterality Date  . Cardiovascular stress test  06/2007    NORMAL  . Breast lumpectomy  2010  . Cholecystectomy  1994  . Tonsillectomy    . Nasal sinus surgery      submucous resection late 1950s  . Cataract extraction  2005,2006    Dr.Devonzo    There were no vitals taken for this visit.  Visit Diagnosis:  Decreased coordination  Rigidity      Subjective Assessment - 05/16/14 1131    Symptoms no falls      Theract: scooting chair in/out at table with significant improvement compared to previous assessment of this task. Able to perform with supervision 2x.  Therex: Seated hamstring stretching 3x30 seconds each leg  Neuro re-ed: Lateral tapping on 8" step stool with intermittent CGA without UE support with pt demonstrating frequent posterior loss of balance. Performed with each leg, 2 sets to form fatigue with verbal cues and visual cues to keep weight shifted forward.  Performed heel raises to promote increased  awareness of body in anterior shifted position  Hip bumps against corner, working on hip strategy, progressing with pt moving farther from the corner.  Rockerboard performed for ankle strategy then for hip strategy with occasional UE support and occasional CGA to steady.                       PT Short Term Goals - 05/06/14 1256    PT SHORT TERM GOAL #1   Title Pt will perform HEP with supervision for improved strength, balance, and gait. (Target date 05/03/14)   Status Not Met   PT SHORT TERM GOAL #2   Title Pt will improve Berg Balance score to at least 25/56 for decreased fall risk.   Status Achieved   PT SHORT TERM GOAL #3   Title Pt will improve TUG score to less than or equal to 30 seconds for improved ADL participation in home.   Status Achieved   PT SHORT TERM GOAL #4   Title Pt will perform at least 8 of 10 reps of sit<>stand with minimal UE support from <18 inch surfaces, for improved transfer efficiency and safety.   Status Not Met           PT Long Term Goals - 04/03/14 1141    PT  LONG TERM GOAL #1   Title Pt will verbalize understanding of fall prevention techniques within home environment. (Target date 06/03/14)   Time 8   Period Weeks   Status New   PT LONG TERM GOAL #2   Title Pt will improve Berg Balance score to at least 35/56 for decreased fall risk.   Time 8   Period Weeks   Status New   PT LONG TERM GOAL #3   Title Pt will imrpove TUG score to less than or equal to 20 seconds for decreased fall risk.   Time 8   Period Weeks   Status New   PT LONG TERM GOAL #4   Title Pt will improve gait velocity to at least 1.8 ft/sec for improved gait efficiency and safety.   Time 8   Period Weeks   Status New   PT LONG TERM GOAL #5   Title Pt will verbalize continued community fitness plans upon D/C from PT.   Time 8   Period Weeks   Status New               Plan - 05/16/14 1154    Clinical Impression Statement Pt continues to have  posterior loss of balance with single limb stance activities. She would also benefit from more training with hip and ankle strategy activities. Continue per plan of care and will likely renew for additional visits to maximize progress and functional independence.   PT Next Visit Plan Single limb stance and weight shifting activities, hip and ankle strategy activities        Problem List Patient Active Problem List   Diagnosis Date Noted  . Dizziness of unknown cause 02/21/2014  . Left ventricular diastolic dysfunction with preserved systolic function 46/80/3212  . Essential hypertension 12/30/2013  . Transaminitis 12/24/2013  . Palpitations 12/23/2013  . Respiratory insufficiency 12/22/2013  . Normocytic anemia 12/21/2013  . Thrombocytopenia 12/21/2013  . Abnormal ECG 12/15/2013  . Abnormal involuntary movement 12/15/2013  . Colorectal polyps 12/15/2013  . Chest pain 12/15/2013  . Arteriosclerosis of coronary artery 12/15/2013  . Chronic kidney disease (CKD), stage III (moderate) 12/15/2013  . Corn 12/15/2013  . Diabetes 12/15/2013  . Breathing difficult 12/15/2013  . Acid reflux 12/15/2013  . HLD (hyperlipidemia) 12/15/2013  . BP (high blood pressure) 12/15/2013  . FOM (frequency of micturition) 12/15/2013  . Breast cancer, female 12/15/2013  . Fungal infection of nail 12/15/2013  . Osteopenia 12/15/2013  . Raynaud's syndrome 12/15/2013  . Restless leg 12/15/2013  . Gougerout-Sjoegren syndrome 12/15/2013  . Change in blood platelet count 12/15/2013  . Infection of urinary tract 12/15/2013  . Lower urinary tract infection 10/12/2013  . Idiopathic Parkinson's disease 09/06/2013  . Bladder spasm 09/05/2013  . Urgency of micturation 09/05/2013  . Dyspnea 06/15/2012  . Hyperglycemia 12/27/2011  . Sjogren's syndrome 12/17/2010  . TIA (transient ischemic attack) 12/17/2010  . Benign hypertensive heart disease without heart failure 09/01/2010  . Parkinson's disease 09/01/2010   . Hypothyroidism 09/01/2010  . Hypercholesterolemia 09/01/2010  . Chronic anemia 09/01/2010   Delrae Sawyers, PT,DPT,NCS 05/16/2014 11:58 AM Phone [phone] FAX [phone]         [facility] [address] [place], [place], [address] Phone: [phone]   Fax:  [phone]

## 2014-05-16 NOTE — Therapy (Signed)
[facility] [address] [place], [place], [address] Phone: [phone]   Fax:  [phone]  Occupational Therapy Treatment  Patient Details  Name: Victoria Lindsey MRN: [id number] Date of Birth: 10-15-1925 Referring Provider:  Elba Barman, MD  Encounter Date: 05/16/2014      OT End of Session - 05/16/14 1041    Visit Number 12   Number of Visits 17   Date for OT Re-Evaluation 06/01/14   Authorization Type Medicare   Authorization Time Period G code needed   Authorization - Visit Number 12   Authorization - Number of Visits 20   OT Start Time 1020   OT Stop Time 1100   OT Time Calculation (min) 40 min   Activity Tolerance Patient tolerated treatment well   Behavior During Therapy [facility] for tasks assessed/performed      Past Medical History  Diagnosis Date  . Hypertension   . Parkinson's disease   . Hyperlipidemia   . Dizziness   . Orthostatic hypotension   . Anemia   . Tremor   . Breast cancer     LEFT BREAST  . Kidney failure     stage 3  . Hypothyroidism   . Prediabetes   . TIA (transient ischemic attack)   . Hyperglycemia   . Left ventricular diastolic dysfunction   . Hypophonia     Past Surgical History  Procedure Laterality Date  . Cardiovascular stress test  06/2007    NORMAL  . Breast lumpectomy  2010  . Cholecystectomy  1994  . Tonsillectomy    . Nasal sinus surgery      submucous resection late 1950s  . Cataract extraction  2005,2006    Dr.Devonzo    There were no vitals filed for this visit.  Visit Diagnosis:  Bradykinesia  Rigidity  Decreased coordination  Weakness      Subjective Assessment - 05/16/14 1024    Currently in Pain? Yes   Pain Score 1    Pain Location Back   Pain Descriptors / Indicators Aching   Pain Type Chronic pain   Pain Frequency Intermittent   Aggravating Factors  stooping   Pain Relieving Factors sitting   Multiple Pain Sites No         Treatment: Therapist reviewed bag exercises for simulated donning shirt, donning pants and pulling up socks, 10 reps each. Pt donned/doffed left sock modified independently. UBE x 6 mins  with 1 rest break for conditioning, pt able to maintain 23-30 RPM. Standing and functional reaching with right then left UE's with trunk rotation, min v.c. for large amplitude movements in prep for ADLs.                       OT Short Term Goals - 05/02/14 1517    OT SHORT TERM GOAL #1   Title I with inital HEP.   Baseline ck 05/03/14   Time 4   Period Weeks   Status Achieved   OT SHORT TERM GOAL #2   Title Pt / family will verbalize understanding of adapted strategies for ADLS/ IADLS.   Time 4   Period Weeks   Status Achieved   OT SHORT TERM GOAL #3   Title Pt will demonstrate improved indpendence with dressing as evidenced by decreasing PPT #4 to 45 secs or less.   Time 4   Period Weeks   Status On-going  not met 04/29/14:  53.66sec   OT  SHORT TERM GOAL #4   Title Pt will demonstrate ability to write 4 sentences with 100 % legibility and minimal decrease in letter size.   Time 4   Period Weeks   Status On-going  Not met 04/29/14--95% legibility and min-mod decrease in size           OT Long Term Goals - 04/03/14 1737    OT LONG TERM GOAL #1   Title Pt will perfom dressing with supervision/set up.   Baseline ck 06/01/14   Time 8   Period Weeks   Status New   OT LONG TERM GOAL #2   Title Pt will perform light home management/ snack prep with min A.   Time 8   Period Weeks   Status New   OT LONG TERM GOAL #3   Title Pt will demonstrate improved standing balance for ADLS as evidenced by increasing standing functional reach by 3 inches bilaterally.   Baseline RUE 7.5 inches, LUE 6 inches   Time 8   Period Weeks   Status New   OT LONG TERM GOAL #4   Title Pt will demonstrate increased ease with feeding as evidenced by decreasing PPT #2 to 10 secs or less.    Baseline 14.66 secs   Time 8   Period Weeks   Status New   OT LONG TERM GOAL #5   Title Pt/ family will verbalize understanding of compensatory strategies for cognition and ways to keep thinking skills sharp   Time 8   Period Weeks   Status New               Problem List Patient Active Problem List   Diagnosis Date Noted  . Dizziness of unknown cause 02/21/2014  . Left ventricular diastolic dysfunction with preserved systolic function 98/26/4158  . Essential hypertension 12/30/2013  . Transaminitis 12/24/2013  . Palpitations 12/23/2013  . Respiratory insufficiency 12/22/2013  . Normocytic anemia 12/21/2013  . Thrombocytopenia 12/21/2013  . Abnormal ECG 12/15/2013  . Abnormal involuntary movement 12/15/2013  . Colorectal polyps 12/15/2013  . Chest pain 12/15/2013  . Arteriosclerosis of coronary artery 12/15/2013  . Chronic kidney disease (CKD), stage III (moderate) 12/15/2013  . Corn 12/15/2013  . Diabetes 12/15/2013  . Breathing difficult 12/15/2013  . Acid reflux 12/15/2013  . HLD (hyperlipidemia) 12/15/2013  . BP (high blood pressure) 12/15/2013  . FOM (frequency of micturition) 12/15/2013  . Breast cancer, female 12/15/2013  . Fungal infection of nail 12/15/2013  . Osteopenia 12/15/2013  . Raynaud's syndrome 12/15/2013  . Restless leg 12/15/2013  . Gougerout-Sjoegren syndrome 12/15/2013  . Change in blood platelet count 12/15/2013  . Infection of urinary tract 12/15/2013  . Lower urinary tract infection 10/12/2013  . Idiopathic Parkinson's disease 09/06/2013  . Bladder spasm 09/05/2013  . Urgency of micturation 09/05/2013  . Dyspnea 06/15/2012  . Hyperglycemia 12/27/2011  . Sjogren's syndrome 12/17/2010  . TIA (transient ischemic attack) 12/17/2010  . Benign hypertensive heart disease without heart failure 09/01/2010  . Parkinson's disease 09/01/2010  . Hypothyroidism 09/01/2010  . Hypercholesterolemia 09/01/2010  . Chronic anemia 09/01/2010     RINE,KATHRYN 05/16/2014, 12:37 PM Theone Murdoch, OTR/L [phone] Phone: [phone] 12:38 PM 05/16/2014 [facility] [address] [place], [place], [address] Phone: [phone]   Fax:  [phone]

## 2014-05-17 ENCOUNTER — Ambulatory Visit: Payer: Medicare Other

## 2014-05-17 DIAGNOSIS — R279 Unspecified lack of coordination: Secondary | ICD-10-CM | POA: Diagnosis not present

## 2014-05-17 DIAGNOSIS — R490 Dysphonia: Secondary | ICD-10-CM

## 2014-05-17 DIAGNOSIS — G20 Parkinson's disease: Secondary | ICD-10-CM

## 2014-05-17 NOTE — Therapy (Signed)
[facility] [address] [place], [place], [address] Phone: [phone]   Fax:  [phone]  Speech Language Pathology Treatment  Patient Details  Name: Victoria Lindsey MRN: [id number] Date of Birth: 04-03-1925 Referring Provider:  [name], DO  Encounter Date: 05/17/2014      End of Session - 05/17/14 1538    Visit Number 4   Number of Visits 16   Date for SLP Re-Evaluation 07/07/14   SLP Start Time 1448   SLP Stop Time  1529   SLP Time Calculation (min) 41 min   Activity Tolerance Patient tolerated treatment well      Past Medical History  Diagnosis Date  . Hypertension   . Parkinson's disease   . Hyperlipidemia   . Dizziness   . Orthostatic hypotension   . Anemia   . Tremor   . Breast cancer     LEFT BREAST  . Kidney failure     stage 3  . Hypothyroidism   . Prediabetes   . TIA (transient ischemic attack)   . Hyperglycemia   . Left ventricular diastolic dysfunction   . Hypophonia     Past Surgical History  Procedure Laterality Date  . Cardiovascular stress test  06/2007    NORMAL  . Breast lumpectomy  2010  . Cholecystectomy  1994  . Tonsillectomy    . Nasal sinus surgery      submucous resection late 1950s  . Cataract extraction  2005,2006    Dr.Devonzo    There were no vitals filed for this visit.  Visit Diagnosis: Hypokinetic Parkinsonian dysphonia      Subjective Assessment - 05/17/14 1455    Symptoms Pt did loud /a/ as prescribed.               ADULT SLP TREATMENT - 05/17/14 1455    General Information   Behavior/Cognition Alert;Cooperative;Pleasant mood   Treatment Provided   Treatment provided Cognitive-Linquistic   Pain Assessment   Pain Assessment No/denies pain   Cognitive-Linquistic Treatment   Treatment focused on Dysarthria   Skilled Treatment Loud /a/ facilitated by SLP with average 80dB with  usual min cues for loudness. Pt read crossword clues  with average loudness 70dB with usual min A for loudness.  In sentence responses, pt req'd consistent min cues (nonverbal and verbal) for appoprirate breath prior to talking louder.     Assessment / Recommendations / Plan   Plan Continue with current plan of care   Progression Toward Goals   Progression toward goals Progressing toward goals            SLP Short Term Goals - 05/17/14 1540    SLP SHORT TERM GOAL #1   Title pt will maintain average of loud /a/ at 80dB for 5 sessions   Time 3   Period Weeks   Status On-going   SLP SHORT TERM GOAL #2   Title pt will produce 16/20 sentences with average 70dB   Time 3   Period Weeks   Status On-going   SLP SHORT TERM GOAL #3   Title pt will improve loudness in 5 minutes simple conversation to 70dB with rare min A   Time 3   Period Weeks   Status On-going          SLP Long Term Goals - 05/17/14 1540    SLP LONG TERM GOAL #1   Title pt will maintain average loudness of 80dB with loud /a/ over  7 sessions   Time 7   Period Weeks   Status On-going   SLP LONG TERM GOAL #2   Title pt will improve loudness to 70dB in 7 minutes simple/mod complex conversation with rare min A   Time 7   Period Weeks   Status On-going   SLP LONG TERM GOAL #3   Title pt will report feeling abdominal movement necessary for louder speech in simple conversation    Time 7   Period Weeks   Status On-going          Plan - 05/17/14 1539    Clinical Impression Statement Pt requires consistent assistance from SLP to use appropirate breath support and loudness in simple sentence tasks.   Speech Therapy Frequency 2x / week   Duration --  7 weeks   Treatment/Interventions Internal/external aids;Functional tasks;Cueing hierarchy;Patient/family education;Compensatory strategies;SLP instruction and feedback   Potential to Achieve Goals Good   Potential Considerations Ability to learn/carryover information        Problem List Patient Active Problem  List   Diagnosis Date Noted  . Dizziness of unknown cause 02/21/2014  . Left ventricular diastolic dysfunction with preserved systolic function 60/06/5995  . Essential hypertension 12/30/2013  . Transaminitis 12/24/2013  . Palpitations 12/23/2013  . Respiratory insufficiency 12/22/2013  . Normocytic anemia 12/21/2013  . Thrombocytopenia 12/21/2013  . Abnormal ECG 12/15/2013  . Abnormal involuntary movement 12/15/2013  . Colorectal polyps 12/15/2013  . Chest pain 12/15/2013  . Arteriosclerosis of coronary artery 12/15/2013  . Chronic kidney disease (CKD), stage III (moderate) 12/15/2013  . Corn 12/15/2013  . Diabetes 12/15/2013  . Breathing difficult 12/15/2013  . Acid reflux 12/15/2013  . HLD (hyperlipidemia) 12/15/2013  . BP (high blood pressure) 12/15/2013  . FOM (frequency of micturition) 12/15/2013  . Breast cancer, female 12/15/2013  . Fungal infection of nail 12/15/2013  . Osteopenia 12/15/2013  . Raynaud's syndrome 12/15/2013  . Restless leg 12/15/2013  . Gougerout-Sjoegren syndrome 12/15/2013  . Change in blood platelet count 12/15/2013  . Infection of urinary tract 12/15/2013  . Lower urinary tract infection 10/12/2013  . Idiopathic Parkinson's disease 09/06/2013  . Bladder spasm 09/05/2013  . Urgency of micturation 09/05/2013  . Dyspnea 06/15/2012  . Hyperglycemia 12/27/2011  . Sjogren's syndrome 12/17/2010  . TIA (transient ischemic attack) 12/17/2010  . Benign hypertensive heart disease without heart failure 09/01/2010  . Parkinson's disease 09/01/2010  . Hypothyroidism 09/01/2010  . Hypercholesterolemia 09/01/2010  . Chronic anemia 09/01/2010    [facility] ,SLP  05/17/2014, 3:41 PM  [facility] [address] [place], [place], [address] Phone: [phone]   Fax:  [phone]

## 2014-05-21 ENCOUNTER — Ambulatory Visit: Payer: Medicare Other | Admitting: Occupational Therapy

## 2014-05-21 ENCOUNTER — Ambulatory Visit: Payer: Medicare Other | Admitting: Physical Therapy

## 2014-05-21 ENCOUNTER — Ambulatory Visit: Payer: Medicare Other

## 2014-05-21 DIAGNOSIS — R490 Dysphonia: Secondary | ICD-10-CM

## 2014-05-21 DIAGNOSIS — R531 Weakness: Secondary | ICD-10-CM

## 2014-05-21 DIAGNOSIS — R269 Unspecified abnormalities of gait and mobility: Secondary | ICD-10-CM

## 2014-05-21 DIAGNOSIS — R29898 Other symptoms and signs involving the musculoskeletal system: Secondary | ICD-10-CM

## 2014-05-21 DIAGNOSIS — R293 Abnormal posture: Secondary | ICD-10-CM

## 2014-05-21 DIAGNOSIS — R258 Other abnormal involuntary movements: Secondary | ICD-10-CM

## 2014-05-21 DIAGNOSIS — R279 Unspecified lack of coordination: Secondary | ICD-10-CM | POA: Diagnosis not present

## 2014-05-21 NOTE — Therapy (Signed)
[facility] [address] [place], [place], [address] Phone: [phone]   Fax:  [phone]  Physical Therapy Treatment  Patient Details  Name: Victoria Lindsey MRN: [id number] Date of Birth: 02-Mar-1926 Referring Provider:  Elba Barman, MD  Encounter Date: 05/21/2014      PT End of Session - 05/21/14 1302    Visit Number 13   Number of Visits 17   Date for PT Re-Evaluation 06/03/14   PT Start Time 1148   PT Stop Time 1230   PT Time Calculation (min) 42 min   Equipment Utilized During Treatment Gait belt   Activity Tolerance Patient tolerated treatment well      Past Medical History  Diagnosis Date  . Hypertension   . Parkinson's disease   . Hyperlipidemia   . Dizziness   . Orthostatic hypotension   . Anemia   . Tremor   . Breast cancer     LEFT BREAST  . Kidney failure     stage 3  . Hypothyroidism   . Prediabetes   . TIA (transient ischemic attack)   . Hyperglycemia   . Left ventricular diastolic dysfunction   . Hypophonia     Past Surgical History  Procedure Laterality Date  . Cardiovascular stress test  06/2007    NORMAL  . Breast lumpectomy  2010  . Cholecystectomy  1994  . Tonsillectomy    . Nasal sinus surgery      submucous resection late 1950s  . Cataract extraction  2005,2006    Dr.Devonzo    There were no vitals filed for this visit.  Visit Diagnosis:  Bradykinesia  Abnormality of gait  Posture abnormality      Subjective Assessment - 05/21/14 1152    Symptoms Nothing new to report; no falls   Currently in Pain? Yes   Pain Score 4    Pain Location Back   Pain Orientation Right   Pain Descriptors / Indicators Aching   Pain Type Chronic pain   Pain Frequency Intermittent   Aggravating Factors  walking agravates   Pain Relieving Factors sitting                       [facility] Adult PT Treatment/Exercise - 05/21/14 1204    High Level Balance   High  Level Balance Activities Backward walking;Marching forwards  Forward/back walking in parallel bars 3 reps x 10 ft   High Level Balance Comments Single limb stance with forward then side step taps at 6 in. step 10 reps, 2 sets with 1 UE support   Knee/Hip Exercises: Standing   Forward Step Up 10 reps;Hand Hold: 2   Other Standing Knee Exercises heel/toe raises x 10 reps, hip strategy work x 10 reps in parallel bars   Other Standing Knee Exercises on foam-heel/toe raises x 10, marching x 10, forward kicks x 10, forward step taps x 10, backward step taps x 10     Pt requires cues throughout session to tighten buttocks for upright posture and to decrease forward lean.  In parallel bars:  Forwards stepping, then side stepping over hurdles x 10 reps with bilateral UE support.  Short distance gait (<100 ft) using 4-wheeled RW with cues for increased foot clearance, heelstrike and step length.               PT Short Term Goals - 05/06/14 1256    PT SHORT TERM GOAL #1   Title  Pt will perform HEP with supervision for improved strength, balance, and gait. (Target date 05/03/14)   Status Not Met   PT SHORT TERM GOAL #2   Title Pt will improve Berg Balance score to at least 25/56 for decreased fall risk.   Status Achieved   PT SHORT TERM GOAL #3   Title Pt will improve TUG score to less than or equal to 30 seconds for improved ADL participation in home.   Status Achieved   PT SHORT TERM GOAL #4   Title Pt will perform at least 8 of 10 reps of sit<>stand with minimal UE support from <18 inch surfaces, for improved transfer efficiency and safety.   Status Not Met           PT Long Term Goals - 04/03/14 1141    PT LONG TERM GOAL #1   Title Pt will verbalize understanding of fall prevention techniques within home environment. (Target date 06/03/14)   Time 8   Period Weeks   Status New   PT LONG TERM GOAL #2   Title Pt will improve Berg Balance score to at least 35/56 for decreased fall  risk.   Time 8   Period Weeks   Status New   PT LONG TERM GOAL #3   Title Pt will imrpove TUG score to less than or equal to 20 seconds for decreased fall risk.   Time 8   Period Weeks   Status New   PT LONG TERM GOAL #4   Title Pt will improve gait velocity to at least 1.8 ft/sec for improved gait efficiency and safety.   Time 8   Period Weeks   Status New   PT LONG TERM GOAL #5   Title Pt will verbalize continued community fitness plans upon D/C from PT.   Time 8   Period Weeks   Status New               Plan - 05/21/14 1302    Clinical Impression Statement Pt requires cues throughout session for upright posture and to tuck in buttocks for upright standing.  With verbal cues, pt is able to improve posture and decrease posterior lean; however, pt needs ongoing cues    Pt will benefit from skilled therapeutic intervention in order to improve on the following deficits Abnormal gait;Decreased activity tolerance;Decreased balance;Decreased mobility;Decreased strength;Difficulty walking;Postural dysfunction   Rehab Potential Good   PT Frequency 2x / week   PT Duration 8 weeks  wk 7 of 8   PT Treatment/Interventions ADLs/Self Care Home Management;Gait training;Stair training;Functional mobility training;Neuromuscular re-education;Balance training;Therapeutic exercise;Therapeutic activities;Patient/family education   PT Next Visit Plan Single limb stance and weight shifting activities, hip and ankle strategy activities   Consulted and Agree with Plan of Care Patient;Family member/caregiver   Family Member Consulted husband        Problem List Patient Active Problem List   Diagnosis Date Noted  . Dizziness of unknown cause 02/21/2014  . Left ventricular diastolic dysfunction with preserved systolic function 00/92/3300  . Essential hypertension 12/30/2013  . Transaminitis 12/24/2013  . Palpitations 12/23/2013  . Respiratory insufficiency 12/22/2013  . Normocytic anemia  12/21/2013  . Thrombocytopenia 12/21/2013  . Abnormal ECG 12/15/2013  . Abnormal involuntary movement 12/15/2013  . Colorectal polyps 12/15/2013  . Chest pain 12/15/2013  . Arteriosclerosis of coronary artery 12/15/2013  . Chronic kidney disease (CKD), stage III (moderate) 12/15/2013  . Corn 12/15/2013  . Diabetes 12/15/2013  . Breathing difficult 12/15/2013  .  Acid reflux 12/15/2013  . HLD (hyperlipidemia) 12/15/2013  . BP (high blood pressure) 12/15/2013  . FOM (frequency of micturition) 12/15/2013  . Breast cancer, female 12/15/2013  . Fungal infection of nail 12/15/2013  . Osteopenia 12/15/2013  . Raynaud's syndrome 12/15/2013  . Restless leg 12/15/2013  . Gougerout-Sjoegren syndrome 12/15/2013  . Change in blood platelet count 12/15/2013  . Infection of urinary tract 12/15/2013  . Lower urinary tract infection 10/12/2013  . Idiopathic Parkinson's disease 09/06/2013  . Bladder spasm 09/05/2013  . Urgency of micturation 09/05/2013  . Dyspnea 06/15/2012  . Hyperglycemia 12/27/2011  . Sjogren's syndrome 12/17/2010  . TIA (transient ischemic attack) 12/17/2010  . Benign hypertensive heart disease without heart failure 09/01/2010  . Parkinson's disease 09/01/2010  . Hypothyroidism 09/01/2010  . Hypercholesterolemia 09/01/2010  . Chronic anemia 09/01/2010    , W. 05/21/2014, 1:09 PM  Mady Haagensen, PT 05/21/2014 1:11 PM Phone: [phone] Fax: [facility] [address] [place], [place], [address] Phone: [phone]   Fax:  [phone]

## 2014-05-21 NOTE — Therapy (Signed)
[facility] [address] [place], [place], [address] Phone: [phone]   Fax:  [phone]  Speech Language Pathology Treatment  Patient Details  Name: Victoria Lindsey MRN: [id number] Date of Birth: 1925-05-16 Referring Provider:  [name], DO  Encounter Date: 05/21/2014      End of Session - 05/21/14 1012    Visit Number 5   Number of Visits 16   Date for SLP Re-Evaluation 07/07/14   SLP Start Time 0934   SLP Stop Time  7902   SLP Time Calculation (min) 41 min   Activity Tolerance Patient tolerated treatment well      Past Medical History  Diagnosis Date  . Hypertension   . Parkinson's disease   . Hyperlipidemia   . Dizziness   . Orthostatic hypotension   . Anemia   . Tremor   . Breast cancer     LEFT BREAST  . Kidney failure     stage 3  . Hypothyroidism   . Prediabetes   . TIA (transient ischemic attack)   . Hyperglycemia   . Left ventricular diastolic dysfunction   . Hypophonia     Past Surgical History  Procedure Laterality Date  . Cardiovascular stress test  06/2007    NORMAL  . Breast lumpectomy  2010  . Cholecystectomy  1994  . Tonsillectomy    . Nasal sinus surgery      submucous resection late 1950s  . Cataract extraction  2005,2006    Dr.Devonzo    There were no vitals filed for this visit.  Visit Diagnosis: Hypokinetic Parkinsonian dysphonia      Subjective Assessment - 05/21/14 0943    Symptoms "It's been hard doing all my exercises." Pt req'd A from SLP for loudness/full breath with comments upon entering [facility] room.               ADULT SLP TREATMENT - 05/21/14 0944    General Information   Behavior/Cognition Alert;Cooperative;Pleasant mood   Treatment Provided   Treatment provided Cognitive-Linquistic   Pain Assessment   Pain Assessment 0-10   Pain Score 4    Pain Location lower back   Pain Descriptors / Indicators Nagging   Pain Intervention(s)  Monitored during session   Cognitive-Linquistic Treatment   Treatment focused on Dysarthria   Skilled Treatment Loud /a/ average today with occasional min cues from SLP for loudness and full breath 82dB. Read sentences (steps in a 4-step sequence) with rare min A for full breath. Pt req'd consistent cues to read steps outside of the task with a full breath and louder speech. Pt read crossword clues with min cues occasionally for loudness. Similarities/differeneces with 60% success and min-mod cues req'd usually for loudness and full breath.    Assessment / Recommendations / Plan   Plan Continue with current plan of care   Progression Toward Goals   Progression toward goals Progressing toward goals            SLP Short Term Goals - 05/21/14 1013    SLP SHORT TERM GOAL #1   Title pt will maintain average of loud /a/ at 80dB for 5 sessions   Time 2   Period Weeks   Status On-going   SLP SHORT TERM GOAL #2   Title pt will produce 16/20 sentences with average 70dB   Time 2   Period Weeks   Status On-going   SLP SHORT TERM GOAL #3   Title pt will  improve loudness in 5 minutes simple conversation to 70dB with rare min A   Time 2   Period Weeks   Status On-going          SLP Long Term Goals - 05/21/14 1014    SLP LONG TERM GOAL #1   Title pt will maintain average loudness of 80dB with loud /a/ over 7 sessions   Time 6   Period Weeks   Status On-going   SLP LONG TERM GOAL #2   Title pt will improve loudness to 70dB in 7 minutes simple/mod complex conversation with rare min A   Time 6   Period Weeks   Status On-going   SLP LONG TERM GOAL #3   Title pt will report feeling abdominal movement necessary for louder speech in simple conversation    Time 6   Period Weeks   Status On-going          Plan - 05/21/14 1013    Clinical Impression Statement Pt requires consistent assistance from SLP to use appropirate breath support and loudness in simple sentence tasks.   Speech  Therapy Frequency 2x / week   Duration --  6 weeks   Treatment/Interventions Internal/external aids;Functional tasks;Cueing hierarchy;Patient/family education;Compensatory strategies;SLP instruction and feedback   Potential to Achieve Goals Good   Potential Considerations Ability to learn/carryover information;Severity of impairments        Problem List Patient Active Problem List   Diagnosis Date Noted  . Dizziness of unknown cause 02/21/2014  . Left ventricular diastolic dysfunction with preserved systolic function 07/16/209  . Essential hypertension 12/30/2013  . Transaminitis 12/24/2013  . Palpitations 12/23/2013  . Respiratory insufficiency 12/22/2013  . Normocytic anemia 12/21/2013  . Thrombocytopenia 12/21/2013  . Abnormal ECG 12/15/2013  . Abnormal involuntary movement 12/15/2013  . Colorectal polyps 12/15/2013  . Chest pain 12/15/2013  . Arteriosclerosis of coronary artery 12/15/2013  . Chronic kidney disease (CKD), stage III (moderate) 12/15/2013  . Corn 12/15/2013  . Diabetes 12/15/2013  . Breathing difficult 12/15/2013  . Acid reflux 12/15/2013  . HLD (hyperlipidemia) 12/15/2013  . BP (high blood pressure) 12/15/2013  . FOM (frequency of micturition) 12/15/2013  . Breast cancer, female 12/15/2013  . Fungal infection of nail 12/15/2013  . Osteopenia 12/15/2013  . Raynaud's syndrome 12/15/2013  . Restless leg 12/15/2013  . Gougerout-Sjoegren syndrome 12/15/2013  . Change in blood platelet count 12/15/2013  . Infection of urinary tract 12/15/2013  . Lower urinary tract infection 10/12/2013  . Idiopathic Parkinson's disease 09/06/2013  . Bladder spasm 09/05/2013  . Urgency of micturation 09/05/2013  . Dyspnea 06/15/2012  . Hyperglycemia 12/27/2011  . Sjogren's syndrome 12/17/2010  . TIA (transient ischemic attack) 12/17/2010  . Benign hypertensive heart disease without heart failure 09/01/2010  . Parkinson's disease 09/01/2010  . Hypothyroidism 09/01/2010   . Hypercholesterolemia 09/01/2010  . Chronic anemia 09/01/2010    Garald Balding, SLP 05/21/2014, 10:15 AM  [facility] [address] [place], [place], [address] Phone: [phone]   Fax:  [phone]

## 2014-05-21 NOTE — Therapy (Signed)
[facility] [address] [place], [place], [address] Phone: [phone]   Fax:  [phone]  Occupational Therapy Treatment  Patient Details  Name: Victoria Lindsey MRN: [id number] Date of Birth: 09/11/1925 Referring Provider:  Elba Barman, MD  Encounter Date: 05/21/2014      OT End of Session - 05/21/14 1110    Visit Number 13   Number of Visits 17   Date for OT Re-Evaluation 06/01/14   Authorization Type Medicare   Authorization Time Period G code needed   Authorization - Visit Number 13   Authorization - Number of Visits 20   OT Start Time 1107   OT Stop Time 1145   OT Time Calculation (min) 38 min   Activity Tolerance Patient tolerated treatment well   Behavior During Therapy [facility] for tasks assessed/performed      Past Medical History  Diagnosis Date  . Hypertension   . Parkinson's disease   . Hyperlipidemia   . Dizziness   . Orthostatic hypotension   . Anemia   . Tremor   . Breast cancer     LEFT BREAST  . Kidney failure     stage 3  . Hypothyroidism   . Prediabetes   . TIA (transient ischemic attack)   . Hyperglycemia   . Left ventricular diastolic dysfunction   . Hypophonia     Past Surgical History  Procedure Laterality Date  . Cardiovascular stress test  06/2007    NORMAL  . Breast lumpectomy  2010  . Cholecystectomy  1994  . Tonsillectomy    . Nasal sinus surgery      submucous resection late 1950s  . Cataract extraction  2005,2006    Dr.Devonzo    There were no vitals filed for this visit.  Visit Diagnosis:  Weakness  Bradykinesia  Rigidity      Subjective Assessment - 05/21/14 1109    Currently in Pain? Yes   Pain Score 4    Pain Location Back   Pain Descriptors / Indicators Aching   Pain Type Chronic pain   Aggravating Factors  stooping   Pain Relieving Factors sitting          Treatment: Reviewed bag exercises for simulated ADLS, (crumpling in each  hand x 6 reps, behind head  X 10 reps, behind back x 10 reps, pass in front x 10 reps min v.c.  For upright posture/large amplitude movements) Dynamic functional reaching activities with bilateral UE's in standing requiring trunk rotation, stepping, min v.c./Min guard for balance. UBE x 5 mins level 1 for conditioning, min v.c. For speed, pt maintained 20-30 RPM and did not require rest break today.                     OT Short Term Goals - 05/02/14 1517    OT SHORT TERM GOAL #1   Title I with inital HEP.   Baseline ck 05/03/14   Time 4   Period Weeks   Status Achieved   OT SHORT TERM GOAL #2   Title Pt / family will verbalize understanding of adapted strategies for ADLS/ IADLS.   Time 4   Period Weeks   Status Achieved   OT SHORT TERM GOAL #3   Title Pt will demonstrate improved indpendence with dressing as evidenced by decreasing PPT #4 to 45 secs or less.   Time 4   Period Weeks   Status On-going  not met 04/29/14:  53.66sec  OT SHORT TERM GOAL #4   Title Pt will demonstrate ability to write 4 sentences with 100 % legibility and minimal decrease in letter size.   Time 4   Period Weeks   Status On-going  Not met 04/29/14--95% legibility and min-mod decrease in size           OT Long Term Goals - 04/03/14 1737    OT LONG TERM GOAL #1   Title Pt will perfom dressing with supervision/set up.   Baseline ck 06/01/14   Time 8   Period Weeks   Status New   OT LONG TERM GOAL #2   Title Pt will perform light home management/ snack prep with min A.   Time 8   Period Weeks   Status New   OT LONG TERM GOAL #3   Title Pt will demonstrate improved standing balance for ADLS as evidenced by increasing standing functional reach by 3 inches bilaterally.   Baseline RUE 7.5 inches, LUE 6 inches   Time 8   Period Weeks   Status New   OT LONG TERM GOAL #4   Title Pt will demonstrate increased ease with feeding as evidenced by decreasing PPT #2 to 10 secs or less.    Baseline 14.66 secs   Time 8   Period Weeks   Status New   OT LONG TERM GOAL #5   Title Pt/ family will verbalize understanding of compensatory strategies for cognition and ways to keep thinking skills sharp   Time 8   Period Weeks   Status New               Plan - 05/21/14 1119    Clinical Impression Statement Pt reports she dressed today modified independently   Clinical Impairments Affecting Rehab Potential decreased strength, decreased coordination, bradykinesia   Plan big amplitude movements with ADLS   OT Home Exercise Plan PWR seated, coordination, bag exercises (crumple, behind head, behind back, in fron pass)   Consulted and Agree with Plan of Care Patient;Family member/caregiver   Family Member Consulted (husband present)        Problem List Patient Active Problem List   Diagnosis Date Noted  . Dizziness of unknown cause 02/21/2014  . Left ventricular diastolic dysfunction with preserved systolic function 81/03/7508  . Essential hypertension 12/30/2013  . Transaminitis 12/24/2013  . Palpitations 12/23/2013  . Respiratory insufficiency 12/22/2013  . Normocytic anemia 12/21/2013  . Thrombocytopenia 12/21/2013  . Abnormal ECG 12/15/2013  . Abnormal involuntary movement 12/15/2013  . Colorectal polyps 12/15/2013  . Chest pain 12/15/2013  . Arteriosclerosis of coronary artery 12/15/2013  . Chronic kidney disease (CKD), stage III (moderate) 12/15/2013  . Corn 12/15/2013  . Diabetes 12/15/2013  . Breathing difficult 12/15/2013  . Acid reflux 12/15/2013  . HLD (hyperlipidemia) 12/15/2013  . BP (high blood pressure) 12/15/2013  . FOM (frequency of micturition) 12/15/2013  . Breast cancer, female 12/15/2013  . Fungal infection of nail 12/15/2013  . Osteopenia 12/15/2013  . Raynaud's syndrome 12/15/2013  . Restless leg 12/15/2013  . Gougerout-Sjoegren syndrome 12/15/2013  . Change in blood platelet count 12/15/2013  . Infection of urinary tract 12/15/2013   . Lower urinary tract infection 10/12/2013  . Idiopathic Parkinson's disease 09/06/2013  . Bladder spasm 09/05/2013  . Urgency of micturation 09/05/2013  . Dyspnea 06/15/2012  . Hyperglycemia 12/27/2011  . Sjogren's syndrome 12/17/2010  . TIA (transient ischemic attack) 12/17/2010  . Benign hypertensive heart disease without heart failure 09/01/2010  . Parkinson's disease  09/01/2010  . Hypothyroidism 09/01/2010  . Hypercholesterolemia 09/01/2010  . Chronic anemia 09/01/2010    RINE,KATHRYN 05/21/2014, 1:23 PM Theone Murdoch, OTR/L [phone] Phone: [phone] 1:23 PM 05/21/2014 [facility] [address] [place], [place], [address] Phone: [phone]   Fax:  [phone]

## 2014-05-22 ENCOUNTER — Other Ambulatory Visit: Payer: Medicare Other

## 2014-05-22 DIAGNOSIS — R7989 Other specified abnormal findings of blood chemistry: Secondary | ICD-10-CM

## 2014-05-23 ENCOUNTER — Encounter: Payer: Self-pay | Admitting: Internal Medicine

## 2014-05-23 LAB — TSH: TSH: 4.08 u[IU]/mL (ref 0.450–4.500)

## 2014-05-24 ENCOUNTER — Ambulatory Visit: Payer: Medicare Other | Admitting: Physical Therapy

## 2014-05-24 ENCOUNTER — Ambulatory Visit: Payer: Medicare Other

## 2014-05-24 ENCOUNTER — Ambulatory Visit: Payer: Medicare Other | Admitting: Occupational Therapy

## 2014-05-24 DIAGNOSIS — R279 Unspecified lack of coordination: Secondary | ICD-10-CM | POA: Diagnosis not present

## 2014-05-24 DIAGNOSIS — R258 Other abnormal involuntary movements: Secondary | ICD-10-CM

## 2014-05-24 DIAGNOSIS — R490 Dysphonia: Secondary | ICD-10-CM

## 2014-05-24 DIAGNOSIS — R531 Weakness: Secondary | ICD-10-CM

## 2014-05-24 DIAGNOSIS — R29898 Other symptoms and signs involving the musculoskeletal system: Secondary | ICD-10-CM

## 2014-05-24 DIAGNOSIS — R269 Unspecified abnormalities of gait and mobility: Secondary | ICD-10-CM

## 2014-05-24 DIAGNOSIS — R278 Other lack of coordination: Secondary | ICD-10-CM

## 2014-05-24 NOTE — Patient Instructions (Signed)
Cognitive Tip / Memory Compensation Strategies  1. Use "WARM" strategy.  W= write it down  A= associate it  R= repeat it  M= make a mental note  2.   You can keep a Social worker.  Use a 3-ring notebook with sections for the following: calendar, important names and phone numbers,  medications, doctors' names/phone numbers, lists/reminders, and a section to journal what you did  each day.   3.    Use a calendar to write appointments down.  4.    Write yourself a schedule for the day.  This can be placed on the calendar or in a separate section of the Memory Notebook.  Keeping a  regular schedule can help memory.  5.    Use medication organizer with sections for each day or morning/evening pills.  You may need help loading it  6.    Keep a basket, or pegboard by the door.  Place items that you need to take out with you in the basket or on the pegboard.  You may also want to  include a message board for reminders.  7.    Use sticky notes.  Place sticky notes with reminders in a place where the task is performed.  For example: " turn off the  stove" placed by the stove, "lock the door" placed on the door at eye level, " take your medications" on  the bathroom mirror or by the place where you normally take your medications.  8.    Use alarms/timers.  Use while cooking to remind yourself to check on food or as a reminder to take your medicine, or as a  reminder to make a call, or as a reminder to perform another task, etc.  Ways to keep thinking skills sharp:  Read articles from the newspaper or watch the news and discuss with someone. Perform crossword puzzles or suduko Making crafts Board games/ card games  Try a new recipe with someone Drink water.

## 2014-05-24 NOTE — Therapy (Signed)
[facility] [address] [place], [place], [address] Phone: [phone]   Fax:  [phone]  Occupational Therapy Treatment  Patient Details  Name: Victoria Lindsey MRN: [id number] Date of Birth: [birth year] Referring Provider:  Elba Barman, MD  Encounter Date: 05/24/2014      OT End of Session - 05/24/14 1152    Visit Number 14   Number of Visits 17   Date for OT Re-Evaluation 06/01/14   Authorization Type Medicare   OT Start Time 1150   OT Stop Time 1230   OT Time Calculation (min) 40 min   Activity Tolerance Patient tolerated treatment well   Behavior During Therapy [facility] for tasks assessed/performed      Past Medical History  Diagnosis Date  . Hypertension   . Parkinson's disease   . Hyperlipidemia   . Dizziness   . Orthostatic hypotension   . Anemia   . Tremor   . Breast cancer     LEFT BREAST  . Kidney failure     stage 3  . Hypothyroidism   . Prediabetes   . TIA (transient ischemic attack)   . Hyperglycemia   . Left ventricular diastolic dysfunction   . Hypophonia     Past Surgical History  Procedure Laterality Date  . Cardiovascular stress test  06/2007    NORMAL  . Breast lumpectomy  2010  . Cholecystectomy  1994  . Tonsillectomy    . Nasal sinus surgery      submucous resection late 1950s  . Cataract extraction  2005,2006    Dr.Devonzo    There were no vitals filed for this visit.  Visit Diagnosis:  Bradykinesia  Weakness  Rigidity  Decreased coordination      Subjective Assessment - 05/24/14 1152    Symptoms Pt reports dressing herself   Currently in Pain? No/denies      Treatment; Therapist started checking progress towards LTG's.( See long term goals) Pt was instructed regarding memory compensation tips/ ways to keep thinking skills sharp .  Fine motor coordination/ cognitive task to copy small peg design, min v.c.for performance, 100 % accuracy for  design.  Arm bike x 5 mins , pt did not require a rest break today, min v.c. For speed, pt able to maintain 20-30 RPM.                    OT Education - 05/24/14 1214    Education provided Yes   Education Details Cognitive tips   Person(s) Educated Patient   Methods Explanation          OT Short Term Goals - 05/02/14 1517    OT SHORT TERM GOAL #1   Title I with inital HEP.   Baseline ck 05/03/14   Time 4   Period Weeks   Status Achieved   OT SHORT TERM GOAL #2   Title Pt / family will verbalize understanding of adapted strategies for ADLS/ IADLS.   Time 4   Period Weeks   Status Achieved   OT SHORT TERM GOAL #3   Title Pt will demonstrate improved indpendence with dressing as evidenced by decreasing PPT #4 to 45 secs or less.   Time 4   Period Weeks   Status On-going  not met 04/29/14:  53.66sec   OT SHORT TERM GOAL #4   Title Pt will demonstrate ability to write 4 sentences with 100 % legibility and minimal decrease in letter size.  Time 4   Period Weeks   Status On-going  Not met 04/29/14--95% legibility and min-mod decrease in size           OT Long Term Goals - 05/24/14 1153    OT LONG TERM GOAL #1   Title Pt will perfom dressing with supervision/set up.   Baseline ck 06/01/14   Time 8   Period Weeks   Status Achieved   OT LONG TERM GOAL #2   Title Pt will perform light home management/ snack prep with min A.   Baseline pt makes her own sausage biscuit and clleans out sink.   Time 8   Period Weeks   Status Achieved   OT LONG TERM GOAL #3   Title Pt will demonstrate improved standing balance for ADLS as evidenced by increasing standing functional reach by 3 inches bilaterally.   Baseline RUE 7.5 inches, LUE 6 inches   Time 8   Period Weeks   Status New   OT LONG TERM GOAL #4   Title Pt will demonstrate increased ease with feeding as evidenced by decreasing PPT #2 to 10 secs or less.   Baseline 14.66 secs   Time 8   Period Weeks    Status New   OT LONG TERM GOAL #5   Title Pt/ family will verbalize understanding of compensatory strategies for cognition and ways to keep thinking skills sharp   Baseline (p) handout issued 05/24/14   Time 8   Period Weeks   Status (p) Achieved               Plan - 05/24/14 1214    Clinical Impression Statement Pt isprogressing towards goals. Plan to renew next week.   Rehab Potential Good   Clinical Impairments Affecting Rehab Potential decreased strength, decreased coordination, bradykinesia   Plan big amplitude movements   Consulted and Agree with Plan of Care Patient;Family member/caregiver        Problem List Patient Active Problem List   Diagnosis Date Noted  . Dizziness of unknown cause 02/21/2014  . Left ventricular diastolic dysfunction with preserved systolic function 79/39/0300  . Essential hypertension 12/30/2013  . Transaminitis 12/24/2013  . Palpitations 12/23/2013  . Respiratory insufficiency 12/22/2013  . Normocytic anemia 12/21/2013  . Thrombocytopenia 12/21/2013  . Abnormal ECG 12/15/2013  . Abnormal involuntary movement 12/15/2013  . Colorectal polyps 12/15/2013  . Chest pain 12/15/2013  . Arteriosclerosis of coronary artery 12/15/2013  . Chronic kidney disease (CKD), stage III (moderate) 12/15/2013  . Corn 12/15/2013  . Diabetes 12/15/2013  . Breathing difficult 12/15/2013  . Acid reflux 12/15/2013  . HLD (hyperlipidemia) 12/15/2013  . BP (high blood pressure) 12/15/2013  . FOM (frequency of micturition) 12/15/2013  . Breast cancer, female 12/15/2013  . Fungal infection of nail 12/15/2013  . Osteopenia 12/15/2013  . Raynaud's syndrome 12/15/2013  . Restless leg 12/15/2013  . Gougerout-Sjoegren syndrome 12/15/2013  . Change in blood platelet count 12/15/2013  . Infection of urinary tract 12/15/2013  . Lower urinary tract infection 10/12/2013  . Idiopathic Parkinson's disease 09/06/2013  . Bladder spasm 09/05/2013  . Urgency of  micturation 09/05/2013  . Dyspnea 06/15/2012  . Hyperglycemia 12/27/2011  . Sjogren's syndrome 12/17/2010  . TIA (transient ischemic attack) 12/17/2010  . Benign hypertensive heart disease without heart failure 09/01/2010  . Parkinson's disease 09/01/2010  . Hypothyroidism 09/01/2010  . Hypercholesterolemia 09/01/2010  . Chronic anemia 09/01/2010    RINE,KATHRYN 05/24/2014, 12:55 PM Theone Murdoch, OTR/L [phone] Phone: [phone] 12:55 PM 05/24/2014 [facility] [address] [place], [place], [address] Phone: [phone]   Fax:  [phone]

## 2014-05-24 NOTE — Therapy (Signed)
[facility] [address] [place], [place], [address] Phone: [phone]   Fax:  [phone]  Physical Therapy Treatment  Patient Details  Name: Victoria Lindsey MRN: [id number] Date of Birth: [birth year] Referring Provider:  Elba Barman, MD  Encounter Date: 05/24/2014      PT End of Session - 05/24/14 1202    Visit Number 14   Number of Visits 17   Date for PT Re-Evaluation 06/03/14   PT Start Time 1103   PT Stop Time 1145   PT Time Calculation (min) 42 min   Equipment Utilized During Treatment Gait belt   Activity Tolerance Patient tolerated treatment well   Behavior During Therapy [facility] for tasks assessed/performed      Past Medical History  Diagnosis Date  . Hypertension   . Parkinson's disease   . Hyperlipidemia   . Dizziness   . Orthostatic hypotension   . Anemia   . Tremor   . Breast cancer     LEFT BREAST  . Kidney failure     stage 3  . Hypothyroidism   . Prediabetes   . TIA (transient ischemic attack)   . Hyperglycemia   . Left ventricular diastolic dysfunction   . Hypophonia     Past Surgical History  Procedure Laterality Date  . Cardiovascular stress test  06/2007    NORMAL  . Breast lumpectomy  2010  . Cholecystectomy  1994  . Tonsillectomy    . Nasal sinus surgery      submucous resection late 1950s  . Cataract extraction  2005,2006    Dr.Devonzo    There were no vitals filed for this visit.  Visit Diagnosis:  Abnormality of gait  Bradykinesia      Subjective Assessment - 05/24/14 1115    Symptoms Had a stumble while trying to turn the other day-thank goodness husband was there to help me not fall.   Currently in Pain? No/denies                       [facility] Adult PT Treatment/Exercise - 05/24/14 1117    Transfers   Transfers Sit to Stand;Stand to Sit   Sit to Stand With upper extremity assist;5: Supervision;From chair/3-in-1  cues for hand  placement; no evidence of post. lean; 8 reps   Ambulation/Gait   Ambulation/Gait Yes   Ambulation/Gait Assistance 4: Min guard   Ambulation Distance (Feet) 220 Feet  x 2, then 200 ft x 2 including obstacle negotiation   Assistive device 4-wheeled walker   Gait Pattern Narrow base of support;Decreased step length - right;Decreased step length - left  decreased heelstrike   Ambulation Surface Level;Indoor   Gait Comments Gait with figure-8 turns and then negotiating around obstacles with supervision, with added cognitive tasks   High Level Balance   High Level Balance Comments Alternating step taps x 15 reps, then consecutive step taps forward, then side x 15 reps with 1 HHA   Knee/Hip Exercises: Aerobic   Stationary Bike NuStep all 4 extremities x 5 minutes, level 3 (went down to legs only after 3 minutes)   Knee/Hip Exercises: Standing   Heel Raises 15 reps   Forward Step Up 15 reps;Hand Hold: 2;Step Height: 6"   Other Standing Knee Exercises toe raises x 15 reps                  PT Short Term Goals - 05/06/14 1256    PT  SHORT TERM GOAL #1   Title Pt will perform HEP with supervision for improved strength, balance, and gait. (Target date 05/03/14)   Status Not Met   PT SHORT TERM GOAL #2   Title Pt will improve Berg Balance score to at least 25/56 for decreased fall risk.   Status Achieved   PT SHORT TERM GOAL #3   Title Pt will improve TUG score to less than or equal to 30 seconds for improved ADL participation in home.   Status Achieved   PT SHORT TERM GOAL #4   Title Pt will perform at least 8 of 10 reps of sit<>stand with minimal UE support from <18 inch surfaces, for improved transfer efficiency and safety.   Status Not Met           PT Long Term Goals - 04/03/14 1141    PT LONG TERM GOAL #1   Title Pt will verbalize understanding of fall prevention techniques within home environment. (Target date 06/03/14)   Time 8   Period Weeks   Status New   PT LONG  TERM GOAL #2   Title Pt will improve Berg Balance score to at least 35/56 for decreased fall risk.   Time 8   Period Weeks   Status New   PT LONG TERM GOAL #3   Title Pt will imrpove TUG score to less than or equal to 20 seconds for decreased fall risk.   Time 8   Period Weeks   Status New   PT LONG TERM GOAL #4   Title Pt will improve gait velocity to at least 1.8 ft/sec for improved gait efficiency and safety.   Time 8   Period Weeks   Status New   PT LONG TERM GOAL #5   Title Pt will verbalize continued community fitness plans upon D/C from PT.   Time 8   Period Weeks   Status New               Plan - 05/24/14 1203    Clinical Impression Statement Treatment session focused more on gait activities today due to pt's reported loss of balance/near falling at home using walker with turning.  No loss of balance noted during session today.   Pt will benefit from skilled therapeutic intervention in order to improve on the following deficits Abnormal gait;Decreased activity tolerance;Decreased balance;Decreased mobility;Decreased strength;Difficulty walking;Postural dysfunction   Rehab Potential Good   PT Frequency 2x / week   PT Duration 8 weeks  wk 7 of 8   PT Treatment/Interventions ADLs/Self Care Home Management;Gait training;Stair training;Functional mobility training;Neuromuscular re-education;Balance training;Therapeutic exercise;Therapeutic activities;Patient/family education   PT Next Visit Plan Check LTGs next week; continue with balance activities   Consulted and Agree with Plan of Care Patient        Problem List Patient Active Problem List   Diagnosis Date Noted  . Dizziness of unknown cause 02/21/2014  . Left ventricular diastolic dysfunction with preserved systolic function 08/65/7846  . Essential hypertension 12/30/2013  . Transaminitis 12/24/2013  . Palpitations 12/23/2013  . Respiratory insufficiency 12/22/2013  . Normocytic anemia 12/21/2013  .  Thrombocytopenia 12/21/2013  . Abnormal ECG 12/15/2013  . Abnormal involuntary movement 12/15/2013  . Colorectal polyps 12/15/2013  . Chest pain 12/15/2013  . Arteriosclerosis of coronary artery 12/15/2013  . Chronic kidney disease (CKD), stage III (moderate) 12/15/2013  . Corn 12/15/2013  . Diabetes 12/15/2013  . Breathing difficult 12/15/2013  . Acid reflux 12/15/2013  . HLD (hyperlipidemia) 12/15/2013  .  BP (high blood pressure) 12/15/2013  . FOM (frequency of micturition) 12/15/2013  . Breast cancer, female 12/15/2013  . Fungal infection of nail 12/15/2013  . Osteopenia 12/15/2013  . Raynaud's syndrome 12/15/2013  . Restless leg 12/15/2013  . Gougerout-Sjoegren syndrome 12/15/2013  . Change in blood platelet count 12/15/2013  . Infection of urinary tract 12/15/2013  . Lower urinary tract infection 10/12/2013  . Idiopathic Parkinson's disease 09/06/2013  . Bladder spasm 09/05/2013  . Urgency of micturation 09/05/2013  . Dyspnea 06/15/2012  . Hyperglycemia 12/27/2011  . Sjogren's syndrome 12/17/2010  . TIA (transient ischemic attack) 12/17/2010  . Benign hypertensive heart disease without heart failure 09/01/2010  . Parkinson's disease 09/01/2010  . Hypothyroidism 09/01/2010  . Hypercholesterolemia 09/01/2010  . Chronic anemia 09/01/2010    Albaraa Swingle W. 05/24/2014, 12:06 PM  Mady Haagensen, PT 05/24/2014 12:07 PM Phone: [phone] Fax: [facility] [address] [place], [place], [address] Phone: [phone]   Fax:  [phone]

## 2014-05-24 NOTE — Patient Instructions (Signed)
You MUST do loud "ah" 5 times, twice a day.

## 2014-05-24 NOTE — Therapy (Signed)
[facility] [address] [place], [place], [address] Phone: [phone]   Fax:  [phone]  Speech Language Pathology Treatment  Patient Details  Name: Victoria Lindsey MRN: [id number] Date of Birth: 06-19-1925 Referring Provider:  [name], DO  Encounter Date: 05/24/2014      End of Session - 05/24/14 1013    Visit Number 6   Number of Visits 16   Date for SLP Re-Evaluation 07/07/14   SLP Start Time 0935   SLP Stop Time  1062   SLP Time Calculation (min) 40 min   Activity Tolerance Patient tolerated treatment well      Past Medical History  Diagnosis Date  . Hypertension   . Parkinson's disease   . Hyperlipidemia   . Dizziness   . Orthostatic hypotension   . Anemia   . Tremor   . Breast cancer     LEFT BREAST  . Kidney failure     stage 3  . Hypothyroidism   . Prediabetes   . TIA (transient ischemic attack)   . Hyperglycemia   . Left ventricular diastolic dysfunction   . Hypophonia     Past Surgical History  Procedure Laterality Date  . Cardiovascular stress test  06/2007    NORMAL  . Breast lumpectomy  2010  . Cholecystectomy  1994  . Tonsillectomy    . Nasal sinus surgery      submucous resection late 1950s  . Cataract extraction  2005,2006    Dr.Devonzo    There were no vitals filed for this visit.  Visit Diagnosis: Hypokinetic Parkinsonian dysphonia      Subjective Assessment - 05/24/14 1008    Symptoms "You want to hear some loud "ah"s, don't you?" "I get them (loud /a/) done about once a day."               ADULT SLP TREATMENT - 05/24/14 0948    General Information   Behavior/Cognition Alert;Cooperative;Pleasant mood   Treatment Provided   Treatment provided Cognitive-Linquistic   Pain Assessment   Pain Assessment No/denies pain   Cognitive-Linquistic Treatment   Treatment focused on Dysarthria   Skilled Treatment Loud /a/ average 83dB with occasional mod  cues for loudness. Simple category exclusion and why: average 67dB. Usual mod cues needed for incr'd loudness to 69dB. Stressed to pt re: sip/hard swallow instead of throat clearing and need to incr H2O from 24 oz/day to 40 oz/day.    Assessment / Recommendations / Plan   Plan Continue with current plan of care   Progression Toward Goals   Progression toward goals Progressing toward goals          SLP Education - 05/24/14 1008    Education provided Yes   Education Details throat clearing - need to sip and hard swallow as alternative, need to perform loud /a/ BID   Person(s) Educated Patient   Methods Explanation   Comprehension Verbalized understanding;Need further instruction;Verbal cues required          SLP Short Term Goals - 05/21/14 1013    SLP SHORT TERM GOAL #1   Title pt will maintain average of loud /a/ at 80dB for 5 sessions   Time 2   Period Weeks   Status On-going   SLP SHORT TERM GOAL #2   Title pt will produce 16/20 sentences with average 70dB   Time 2   Period Weeks   Status On-going   SLP SHORT TERM GOAL #3  Title pt will improve loudness in 5 minutes simple conversation to 70dB with rare min A   Time 2   Period Weeks   Status On-going          SLP Long Term Goals - 05/21/14 1014    SLP LONG TERM GOAL #1   Title pt will maintain average loudness of 80dB with loud /a/ over 7 sessions   Time 6   Period Weeks   Status On-going   SLP LONG TERM GOAL #2   Title pt will improve loudness to 70dB in 7 minutes simple/mod complex conversation with rare min A   Time 6   Period Weeks   Status On-going   SLP LONG TERM GOAL #3   Title pt will report feeling abdominal movement necessary for louder speech in simple conversation    Time 6   Period Weeks   Status On-going          Plan - 05/24/14 1013    Clinical Impression Statement Less cues needed for loud /a/today but cues for loudness continue necessary for simple structured tasks. Skilled ST to cont  to encourage carryover to unstructured tasks.   Speech Therapy Frequency 2x / week   Duration --  6 weeks   Treatment/Interventions Internal/external aids;Functional tasks;Cueing hierarchy;Patient/family education;Compensatory strategies;SLP instruction and feedback   Potential to Achieve Goals Good   Potential Considerations Ability to learn/carryover information;Severity of impairments        Problem List Patient Active Problem List   Diagnosis Date Noted  . Dizziness of unknown cause 02/21/2014  . Left ventricular diastolic dysfunction with preserved systolic function 25/36/6440  . Essential hypertension 12/30/2013  . Transaminitis 12/24/2013  . Palpitations 12/23/2013  . Respiratory insufficiency 12/22/2013  . Normocytic anemia 12/21/2013  . Thrombocytopenia 12/21/2013  . Abnormal ECG 12/15/2013  . Abnormal involuntary movement 12/15/2013  . Colorectal polyps 12/15/2013  . Chest pain 12/15/2013  . Arteriosclerosis of coronary artery 12/15/2013  . Chronic kidney disease (CKD), stage III (moderate) 12/15/2013  . Corn 12/15/2013  . Diabetes 12/15/2013  . Breathing difficult 12/15/2013  . Acid reflux 12/15/2013  . HLD (hyperlipidemia) 12/15/2013  . BP (high blood pressure) 12/15/2013  . FOM (frequency of micturition) 12/15/2013  . Breast cancer, female 12/15/2013  . Fungal infection of nail 12/15/2013  . Osteopenia 12/15/2013  . Raynaud's syndrome 12/15/2013  . Restless leg 12/15/2013  . Gougerout-Sjoegren syndrome 12/15/2013  . Change in blood platelet count 12/15/2013  . Infection of urinary tract 12/15/2013  . Lower urinary tract infection 10/12/2013  . Idiopathic Parkinson's disease 09/06/2013  . Bladder spasm 09/05/2013  . Urgency of micturation 09/05/2013  . Dyspnea 06/15/2012  . Hyperglycemia 12/27/2011  . Sjogren's syndrome 12/17/2010  . TIA (transient ischemic attack) 12/17/2010  . Benign hypertensive heart disease without heart failure 09/01/2010  .  Parkinson's disease 09/01/2010  . Hypothyroidism 09/01/2010  . Hypercholesterolemia 09/01/2010  . Chronic anemia 09/01/2010    Garald Balding, SLP 05/24/2014, 10:15 AM  [facility] [address] [place], [place], [address] Phone: [phone]   Fax:  [phone]

## 2014-05-28 ENCOUNTER — Ambulatory Visit: Payer: Medicare Other | Admitting: Occupational Therapy

## 2014-05-28 ENCOUNTER — Ambulatory Visit: Payer: Medicare Other | Admitting: Physical Therapy

## 2014-05-28 ENCOUNTER — Other Ambulatory Visit: Payer: Self-pay | Admitting: *Deleted

## 2014-05-28 ENCOUNTER — Ambulatory Visit: Payer: Medicare Other | Admitting: Speech Pathology

## 2014-05-28 DIAGNOSIS — R269 Unspecified abnormalities of gait and mobility: Secondary | ICD-10-CM

## 2014-05-28 DIAGNOSIS — R293 Abnormal posture: Secondary | ICD-10-CM

## 2014-05-28 DIAGNOSIS — R258 Other abnormal involuntary movements: Secondary | ICD-10-CM

## 2014-05-28 DIAGNOSIS — R279 Unspecified lack of coordination: Secondary | ICD-10-CM

## 2014-05-28 DIAGNOSIS — R29898 Other symptoms and signs involving the musculoskeletal system: Secondary | ICD-10-CM

## 2014-05-28 DIAGNOSIS — R278 Other lack of coordination: Secondary | ICD-10-CM

## 2014-05-28 DIAGNOSIS — R490 Dysphonia: Secondary | ICD-10-CM

## 2014-05-28 DIAGNOSIS — R531 Weakness: Secondary | ICD-10-CM

## 2014-05-28 MED ORDER — AMBULATORY NON FORMULARY MEDICATION: Status: DC

## 2014-05-28 NOTE — Telephone Encounter (Signed)
[name] # [phone] Requested to be faxed to pharmacy.

## 2014-05-28 NOTE — Patient Instructions (Signed)

## 2014-05-28 NOTE — Patient Instructions (Signed)
Pt to continue loud /a/ and reading aloud  Pt to get a rubber band to remind her to breath and get loud during bridge game

## 2014-05-28 NOTE — Therapy (Signed)
[facility] [address] [place], [place], [address] Phone: [phone]   Fax:  [phone]  Occupational Therapy Treatment  Patient Details  Name: Victoria Lindsey MRN: [id number] Date of Birth: [birth year] Referring Provider:  Elba Barman, MD  Encounter Date: 05/28/2014      OT End of Session - 05/28/14 1115    Visit Number 15   Number of Visits 17   Date for OT Re-Evaluation 06/01/14   Authorization Type Medicare   Authorization Time Period G code needed   Authorization - Visit Number 15   Authorization - Number of Visits 20   OT Start Time 1105   OT Stop Time 1145   OT Time Calculation (min) 40 min   Activity Tolerance Patient tolerated treatment well   Behavior During Therapy [facility] for tasks assessed/performed      Past Medical History  Diagnosis Date  . Hypertension   . Parkinson's disease   . Hyperlipidemia   . Dizziness   . Orthostatic hypotension   . Anemia   . Tremor   . Breast cancer     LEFT BREAST  . Kidney failure     stage 3  . Hypothyroidism   . Prediabetes   . TIA (transient ischemic attack)   . Hyperglycemia   . Left ventricular diastolic dysfunction   . Hypophonia     Past Surgical History  Procedure Laterality Date  . Cardiovascular stress test  06/2007    NORMAL  . Breast lumpectomy  2010  . Cholecystectomy  1994  . Tonsillectomy    . Nasal sinus surgery      submucous resection late 1950s  . Cataract extraction  2005,2006    Dr.Devonzo    There were no vitals filed for this visit.  Visit Diagnosis:  Weakness  Decreased coordination  Rigidity  Bradykinesia  Abnormality of gait  Posture abnormality      Subjective Assessment - 05/28/14 1109    Symptoms Pt denies pain   Currently in Pain? No/denies       Treatment: Therapist checked progress towards goals. Pt. and husband verbalize understanding of progress. Therapist educated pt in technique for  use of big movement for UB dressing, however pt requires supervision/ min A due to LOB. Arm bile for conditioning x 5 mins, min v.c. For speed , pt maintained 23-25 RPM.                       OT Short Term Goals - 05/28/14 1113    OT SHORT TERM GOAL #1   Title I with inital HEP.   Baseline ck 05/03/14   Time 4   Period Weeks   Status Achieved   OT SHORT TERM GOAL #2   Title Pt / family will verbalize understanding of adapted strategies for ADLS/ IADLS.   Time 4   Period Weeks   Status Achieved   OT SHORT TERM GOAL #3   Title Pt will demonstrate improved indpendence with dressing as evidenced by decreasing PPT #4 to 45 secs or less.   Baseline 05/28/14-36.79 secs   Time 4   Period Weeks   Status Achieved  not met 04/29/14:  53.66sec   OT SHORT TERM GOAL #4   Title Pt will demonstrate ability to write 4 sentences with 100 % legibility and minimal decrease in letter size.   Baseline 100% legibility and no significant decrease in size, 05/28/14   Time 4  Period Weeks   Status Achieved  Not met 04/29/14--95% legibility and min-mod decrease in size   OT SHORT TERM GOAL #5   Title Upgraded goal- Pt will demonstrate improved indpendence with dressing as evidenced by performing PPT #4 in 30 secs without LOB.   Time 4   Period Weeks   Status New   Additional Short Term Goals   Additional Short Term Goals Yes   OT SHORT TERM GOAL #6   Title Pt will perform simple stove top cooking task with close supervision.   Baseline due 06/27/14   Time 4   Period Weeks   Status New           OT Long Term Goals - 05/28/14 1113    OT LONG TERM GOAL #1   Title Pt will perfom dressing with supervision/set up.   Baseline ck 06/01/14   Time 8   Period Weeks   Status Achieved   OT LONG TERM GOAL #2   Title Pt will perform light home management/ snack prep with min A.   Baseline pt makes her own sausage biscuit and clleans out sink.   Time 8   Period Weeks   Status Achieved    OT LONG TERM GOAL #3   Title Pt will demonstrate improved standing balance for ADLS as evidenced by increasing standing functional reach by 3 inches bilaterally.(see below for revised goal on 05/28/14)   Baseline 05/28/14-RUE 8.5, LUE 9.25- met for LUE , RUE ongoing, revised goal : increase RUE standing functional reach to 9.5 inches for increased standing balance for ADLs.   Time 8   Period Weeks   Status On-going   OT LONG TERM GOAL #4   Title Pt will demonstrate increased ease with feeding as evidenced by decreasing PPT #2 to 10 secs or less.   Baseline 12.91 secs- 05/28/14, not fully met, continue goal   Time 8   Period Weeks   Status On-going   OT LONG TERM GOAL #5   Title Pt/ family will verbalize understanding of compensatory strategies for cognition and ways to keep thinking skills sharp   Baseline handout issued 05/24/14   Time 8   Period Weeks   Status Achieved   Long Term Additional Goals   Additional Long Term Goals Yes   OT LONG TERM GOAL #6   Title Pt will perform basic cooking/ home management in standing at a modified independent level   Time 8   Period Weeks   Status New               Plan - 05/28/14 1306    Clinical Impression Statement Pt met, 3/5 LTG's. She can benefit from continued skilled OT to maximize safety and indpendence with ADLS/ADLS.   Pt will benefit from skilled therapeutic intervention in order to improve on the following deficits (Retired) Decreased cognition;Decreased knowledge of use of DME;Abnormal gait;Impaired flexibility;Pain;Decreased mobility;Decreased coordination;Decreased activity tolerance;Decreased endurance;Decreased range of motion;Decreased strength;Decreased balance;Decreased knowledge of precautions;Decreased safety awareness;Impaired perceived functional ability;Difficulty walking;Impaired UE functional use   Rehab Potential Good   Clinical Impairments Affecting Rehab Potential decreased strength, decreased coordination,  bradykinesia   OT Frequency 2x / week   OT Duration 8 weeks   OT Treatment/Interventions Self-care/ADL training;Fluidtherapy;DME and/or AE instruction;Splinting;Patient/family education;Balance training;Therapeutic exercises;Ultrasound;Therapeutic exercise;Therapeutic activities;Cognitive remediation/compensation;Passive range of motion;Functional Mobility Training;Neuromuscular education;Iontophoresis;Parrafin;Energy conservation;Manual Therapy;Visual/perceptual remediation/compensation   Plan renew, big amplitude movements / ADLS   OT Home Exercise Plan  HEP issued-PWR seated, coordination, bag exercises (crumple, behind  head, behind back, in fron pass)   Consulted and Agree with Plan of Care Patient   Family Member Consulted (husband present)        Problem List Patient Active Problem List   Diagnosis Date Noted  . Dizziness of unknown cause 02/21/2014  . Left ventricular diastolic dysfunction with preserved systolic function 02/77/4128  . Essential hypertension 12/30/2013  . Transaminitis 12/24/2013  . Palpitations 12/23/2013  . Respiratory insufficiency 12/22/2013  . Normocytic anemia 12/21/2013  . Thrombocytopenia 12/21/2013  . Abnormal ECG 12/15/2013  . Abnormal involuntary movement 12/15/2013  . Colorectal polyps 12/15/2013  . Chest pain 12/15/2013  . Arteriosclerosis of coronary artery 12/15/2013  . Chronic kidney disease (CKD), stage III (moderate) 12/15/2013  . Corn 12/15/2013  . Diabetes 12/15/2013  . Breathing difficult 12/15/2013  . Acid reflux 12/15/2013  . HLD (hyperlipidemia) 12/15/2013  . BP (high blood pressure) 12/15/2013  . FOM (frequency of micturition) 12/15/2013  . Breast cancer, female 12/15/2013  . Fungal infection of nail 12/15/2013  . Osteopenia 12/15/2013  . Raynaud's syndrome 12/15/2013  . Restless leg 12/15/2013  . Gougerout-Sjoegren syndrome 12/15/2013  . Change in blood platelet count 12/15/2013  . Infection of urinary tract 12/15/2013  .  Lower urinary tract infection 10/12/2013  . Idiopathic Parkinson's disease 09/06/2013  . Bladder spasm 09/05/2013  . Urgency of micturation 09/05/2013  . Dyspnea 06/15/2012  . Hyperglycemia 12/27/2011  . Sjogren's syndrome 12/17/2010  . TIA (transient ischemic attack) 12/17/2010  . Benign hypertensive heart disease without heart failure 09/01/2010  . Parkinson's disease 09/01/2010  . Hypothyroidism 09/01/2010  . Hypercholesterolemia 09/01/2010  . Chronic anemia 09/01/2010    RINE,KATHRYN 05/28/2014, 5:17 PM Theone Murdoch, OTR/L [phone] Phone: [phone] 5:17 PM 05/28/2014 [facility] [address] [place], [place], [address] Phone: [phone]   Fax:  [phone]

## 2014-05-28 NOTE — Therapy (Signed)
[facility] [address] [place], [place], [address] Phone: [phone]   Fax:  [phone]  Speech Language Pathology Treatment  Patient Details  Name: Victoria Lindsey MRN: [id number] Date of Birth: Sep 10, 1925 Referring Provider:  [name], DO  Encounter Date: 05/28/2014      End of Session - 05/28/14 0930    Visit Number 7   Number of Visits 16   Date for SLP Re-Evaluation 07/07/14   SLP Start Time 0846   SLP Stop Time  0930   SLP Time Calculation (min) 44 min   Activity Tolerance Patient tolerated treatment well      Past Medical History  Diagnosis Date  . Hypertension   . Parkinson's disease   . Hyperlipidemia   . Dizziness   . Orthostatic hypotension   . Anemia   . Tremor   . Breast cancer     LEFT BREAST  . Kidney failure     stage 3  . Hypothyroidism   . Prediabetes   . TIA (transient ischemic attack)   . Hyperglycemia   . Left ventricular diastolic dysfunction   . Hypophonia     Past Surgical History  Procedure Laterality Date  . Cardiovascular stress test  06/2007    NORMAL  . Breast lumpectomy  2010  . Cholecystectomy  1994  . Tonsillectomy    . Nasal sinus surgery      submucous resection late 1950s  . Cataract extraction  2005,2006    Dr.Devonzo    There were no vitals filed for this visit.  Visit Diagnosis: Hypokinetic Parkinsonian dysphonia      Subjective Assessment - 05/28/14 0855    Symptoms "I need to breathe deep"   Currently in Pain? No/denies               ADULT SLP TREATMENT - 05/28/14 0856    General Information   Behavior/Cognition Alert;Cooperative;Pleasant mood   Treatment Provided   Treatment provided Cognitive-Linquistic   Cognitive-Linquistic Treatment   Treatment focused on Dysarthria   Skilled Treatment Loud /a/ average of 86dB with occassional min A for duration and abdominal breath. Structured speech tasks with reading problems and  generate answers  - pt required usual min reminders for big breath and loud volume. Structured speech tasks with objects description average  69dB with usual mod A.    Assessment / Recommendations / Plan   Plan Continue with current plan of care   Progression Toward Goals   Progression toward goals Progressing toward goals          SLP Education - 05/28/14 0930    Education provided Yes   Education Details compensations   Person(s) Educated Patient   Methods Explanation   Comprehension Verbalized understanding;Returned demonstration;Verbal cues required          SLP Short Term Goals - 05/28/14 0934    SLP SHORT TERM GOAL #1   Title pt will maintain average of loud /a/ at 80dB for 5 sessions   Time 2   Period Weeks   Status On-going   SLP SHORT TERM GOAL #2   Title pt will produce 16/20 sentences with average 70dB   Time 2   Period Weeks   Status On-going   SLP SHORT TERM GOAL #3   Title pt will improve loudness in 5 minutes simple conversation to 70dB with rare min A   Time 2   Period Weeks   Status On-going  SLP Long Term Goals - 05/28/14 0934    SLP LONG TERM GOAL #1   Title pt will maintain average loudness of 80dB with loud /a/ over 7 sessions   Time 6   Period Weeks   Status On-going   SLP LONG TERM GOAL #2   Title pt will improve loudness to 70dB in 7 minutes simple/mod complex conversation with rare min A   Time 6   Period Weeks   Status On-going   SLP LONG TERM GOAL #3   Title pt will report feeling abdominal movement necessary for louder speech in simple conversation    Time 6   Period Weeks   Status On-going          Plan - 05/28/14 0930    Clinical Impression Statement /a/ increased loudness - usual mod A for loudness and breathing during structured speech tasks and conversation. Encouraged pt to use external reminders for loud voice.   Speech Therapy Frequency 2x / week   Treatment/Interventions Internal/external aids;Functional  tasks;Cueing hierarchy;Patient/family education;Compensatory strategies;SLP instruction and feedback   Potential to Achieve Goals Good   Potential Considerations Ability to learn/carryover information;Severity of impairments        Problem List Patient Active Problem List   Diagnosis Date Noted  . Dizziness of unknown cause 02/21/2014  . Left ventricular diastolic dysfunction with preserved systolic function 76/54/6503  . Essential hypertension 12/30/2013  . Transaminitis 12/24/2013  . Palpitations 12/23/2013  . Respiratory insufficiency 12/22/2013  . Normocytic anemia 12/21/2013  . Thrombocytopenia 12/21/2013  . Abnormal ECG 12/15/2013  . Abnormal involuntary movement 12/15/2013  . Colorectal polyps 12/15/2013  . Chest pain 12/15/2013  . Arteriosclerosis of coronary artery 12/15/2013  . Chronic kidney disease (CKD), stage III (moderate) 12/15/2013  . Corn 12/15/2013  . Diabetes 12/15/2013  . Breathing difficult 12/15/2013  . Acid reflux 12/15/2013  . HLD (hyperlipidemia) 12/15/2013  . BP (high blood pressure) 12/15/2013  . FOM (frequency of micturition) 12/15/2013  . Breast cancer, female 12/15/2013  . Fungal infection of nail 12/15/2013  . Osteopenia 12/15/2013  . Raynaud's syndrome 12/15/2013  . Restless leg 12/15/2013  . Gougerout-Sjoegren syndrome 12/15/2013  . Change in blood platelet count 12/15/2013  . Infection of urinary tract 12/15/2013  . Lower urinary tract infection 10/12/2013  . Idiopathic Parkinson's disease 09/06/2013  . Bladder spasm 09/05/2013  . Urgency of micturation 09/05/2013  . Dyspnea 06/15/2012  . Hyperglycemia 12/27/2011  . Sjogren's syndrome 12/17/2010  . TIA (transient ischemic attack) 12/17/2010  . Benign hypertensive heart disease without heart failure 09/01/2010  . Parkinson's disease 09/01/2010  . Hypothyroidism 09/01/2010  . Hypercholesterolemia 09/01/2010  . Chronic anemia 09/01/2010    Yadiel Aubry, Annye Rusk, SLP 05/28/2014, 9:35  AM  [facility] [address] [place], [place], [address] Phone: [phone]   Fax:  [phone]

## 2014-05-29 ENCOUNTER — Ambulatory Visit (INDEPENDENT_AMBULATORY_CARE_PROVIDER_SITE_OTHER): Payer: Medicare Other | Admitting: Neurology

## 2014-05-29 ENCOUNTER — Encounter: Payer: Self-pay | Admitting: *Deleted

## 2014-05-29 ENCOUNTER — Encounter: Payer: Self-pay | Admitting: Cardiology

## 2014-05-29 ENCOUNTER — Encounter: Payer: Self-pay | Admitting: Neurology

## 2014-05-29 ENCOUNTER — Ambulatory Visit: Payer: Medicare Other

## 2014-05-29 ENCOUNTER — Ambulatory Visit (INDEPENDENT_AMBULATORY_CARE_PROVIDER_SITE_OTHER): Payer: Medicare Other | Admitting: Cardiology

## 2014-05-29 VITALS — BP 136/54 | HR 79 | Ht 61.0 in | Wt 117.1 lb

## 2014-05-29 VITALS — BP 124/65 | HR 78 | Ht 61.0 in | Wt 117.0 lb

## 2014-05-29 DIAGNOSIS — G20 Parkinson's disease: Secondary | ICD-10-CM

## 2014-05-29 DIAGNOSIS — G451 Carotid artery syndrome (hemispheric): Secondary | ICD-10-CM | POA: Diagnosis not present

## 2014-05-29 DIAGNOSIS — I10 Essential (primary) hypertension: Secondary | ICD-10-CM

## 2014-05-29 DIAGNOSIS — R490 Dysphonia: Secondary | ICD-10-CM

## 2014-05-29 DIAGNOSIS — I674 Hypertensive encephalopathy: Secondary | ICD-10-CM | POA: Insufficient documentation

## 2014-05-29 DIAGNOSIS — R279 Unspecified lack of coordination: Secondary | ICD-10-CM | POA: Diagnosis not present

## 2014-05-29 NOTE — Progress Notes (Signed)
Cardiology Office Note   Date:  05/29/2014   ID:  Victoria, Lindsey [birth year], MRN [id number]  PCP:  Hollace Kinnier, DO  Cardiologist:   [name], MD   No chief complaint on file.     History of Present Illness: Victoria Lindsey is a 81 y.o. female who presents for follow-up office visit.  This pleasant 81 year old woman is seen for a follow-up office visit. The patient was hospitalized from October 16 through 12/27/2013. She was placed in the hospital because of initial concern about possible TIA. She was also having markedly labile blood pressure readings. She was seen in mid August 2015 because of recent elevation of her blood pressure. She has a history of essential hypertension. She has a past history of a TIAs and is on aspirin and Plavix. She has a history of Parkinson's disease and a history of Sjogren's syndrome. She does not have any known ischemic heart disease and she had a normal nuclear stress test in 2009. Marland Kitchen There was mild aortic insufficiency and mild mitral regurgitation. Her last chest x-ray in March 2014 showed borderline cardiomegaly She has stage III chronic kidney disease.  Marland Kitchen Her last echocardiogram was 10/25/13 and showed a hyperdynamic left ventricle with ejection fraction 75-80%. Since that at no she has been on a higher dose of beta blocker and has felt better.the echocardiogram also showed diastolic dysfunction. While hospitalized in October 2015 the patient developed acute respiratory distress while she was in the MRI machine. There was a question as to whether she might have aspirated. She was intubated and was on a ventilator for 14 hours. She improved quickly. Chest x-ray showed marked bilateral densities raising question of heart failure versus aspiration pneumonia. Since discharge she has been followed up by neurology.  Her neurologist is Dr.Xu. Neurology  appropriately requested a a vent monitor to rule out arrhythmia as a cause of her  dizziness and previous TIA.  The 30 day monitor did not show any events.  She was monitored from December 17 until 03/22/2014. Since last visit the patient has felt better.  She is getting outpatient rehabilitation at the [facility] neurolog[facility] rehabilitation [facility].  She is receiving physical therapy occupational therapy and speech therapy.  Her daughter is very pleased with her progress. The patient has had no further TIA symptoms.  She is not having any chest pain or angina.  Past Medical History  Diagnosis Date  . Hypertension   . Parkinson's disease   . Hyperlipidemia   . Dizziness   . Orthostatic hypotension   . Anemia   . Tremor   . Breast cancer     LEFT BREAST  . Kidney failure     stage 3  . Hypothyroidism   . Prediabetes   . TIA (transient ischemic attack)   . Hyperglycemia   . Left ventricular diastolic dysfunction   . Hypophonia     Past Surgical History  Procedure Laterality Date  . Cardiovascular stress test  06/2007    NORMAL  . Breast lumpectomy  2010  . Cholecystectomy  1994  . Tonsillectomy    . Nasal sinus surgery      submucous resection late 1950s  . Cataract extraction  2005,2006    Dr.Devonzo     Current Outpatient Prescriptions  Medication Sig Dispense Refill  . AMBULATORY NON FORMULARY MEDICATION Outpatient Speech Therapy, evaluate and treat.  DX: G20, R49.8 1 each 0  . aspirin 81 MG tablet Take 81 mg by  mouth every evening.     Marland Kitchen BIOTIN PO Take 1,000 mcg by mouth daily.     . Cholecalciferol (VITAMIN D3) 2000 UNITS TABS Take 1 tablet by mouth daily.     . clopidogrel (PLAVIX) 75 MG tablet TAKE 1 TABLET BY MOUTH EVERY DAY 30 tablet 1  . Cranberry 500 MG CAPS Take 500 mg by mouth daily.     Marland Kitchen docusate sodium (COLACE) 100 MG capsule Take 100 mg by mouth daily.    . folic acid (FOLVITE) 903 MCG tablet Take 800 mcg by mouth daily.     . furosemide (LASIX) 20 MG tablet Take 1 tablet (20 mg total) by mouth daily. (Patient taking differently: Take 20 mg by  mouth daily. Ok to take extra 1/2 if systolic blood pressure 009 or above) 30 tablet 5  . metoprolol (LOPRESSOR) 100 MG tablet Take 1 tablet (100 mg total) by mouth 2 (two) times daily. 180 tablet 3  . Multiple Vitamin (MULTIVITAMIN WITH MINERALS) TABS tablet Take 1 tablet by mouth daily.    . ONE TOUCH ULTRA TEST test strip   1  . ONETOUCH DELICA LANCETS 23R MISC by Does not apply route as directed. Use lancets to check blood sugars    . pilocarpine (SALAGEN) 5 MG tablet Take 5 mg by mouth as needed.   0  . Probiotic Product (PROBIOTIC PO) Take 1 tablet by mouth daily.    . rasagiline (AZILECT) 1 MG TABS Take 1 mg by mouth daily.    . ReliOn Ultra Thin Lancets MISC by Does not apply route.    . rosuvastatin (CRESTOR) 10 MG tablet Take 10 mg by mouth every evening.     . Rotigotine 8 MG/24HR PT24 Place 1 patch onto the skin daily.     Marland Kitchen spironolactone (ALDACTONE) 25 MG tablet Take 1 tablet (25 mg total) by mouth daily. 90 tablet 3  . SYNTHROID 112 MCG tablet Take 112 mcg by mouth daily.  0   No current facility-administered medications for this visit.    Allergies:   Aciphex; Amantadines; Avapro; Carbidopa-levodopa; Cardura; Ciprofloxacin; Clonidine derivatives; Cozaar; Diltiazem; Famotidine; Hctz; Indapamide; Lansoprazole; Lisinopril; Loratadine; Maxidex; Mirapex; Norvasc; Pantoprazole sodium; Penicillins; Prilosec; Procardia; Proton pump inhibitors; Sulfa drugs cross reactors; Trimethoprim; and Zyrtec    Social History:  The patient  reports that she quit smoking about 50 years ago. Her smoking use included Cigarettes. She has a 6 pack-year smoking history. She has never used smokeless tobacco. She reports that she does not drink alcohol or use illicit drugs.   Family History:  The patient's family history includes COPD in her sister; Cancer in her brother; Diabetes in her father; Heart disease in her mother; Heart failure in her father; Osteoporosis in her daughter; Parkinsonism in her  father; Stroke in her sister.    ROS:  Please see the history of present illness.   Otherwise, review of systems are positive for none.   All other systems are reviewed and negative.    PHYSICAL EXAM: VS:  BP 136/54 mmHg  Pulse 79  Ht 5\' 1"  (1.549 m)  Wt 117 lb 1.9 oz (53.125 kg)  BMI 22.14 kg/m2 , BMI Body mass index is 22.14 kg/(m^2). GEN: Well nourished, well developed, in no acute distress HEENT: normal Neck: no JVD, carotid bruits, or masses Cardiac: RRR; no murmurs, rubs, or gallops,no edema  Respiratory:  clear to auscultation bilaterally, normal work of breathing GI: soft, nontender, nondistended, + BS MS: no deformity or atrophy  Skin: warm and dry, no rash Neuro:  Strength and sensation are intact Psych: euthymic mood, full affect   EKG:  EKG is ordered today. The ekg ordered today demonstrates normal sinus rhythm with left ventricular hypertrophy and secondary ST-T wave changes.  Since previous tracing of 02/21/14, no significant change   Recent Labs: 12/25/2013: ALT 51* 04/08/2014: BUN 23; Creatinine 0.81; Hemoglobin 11.3; Platelets 130*; Potassium 5.3*; Sodium 137 05/22/2014: TSH 4.080    Lipid Panel    Component Value Date/Time   CHOL 114 04/08/2014 1029   CHOL 99 12/22/2013 0239   TRIG 96 04/08/2014 1029   HDL 47 04/08/2014 1029   HDL 41 12/22/2013 0239   CHOLHDL 2.4 04/08/2014 1029   CHOLHDL 2.4 12/22/2013 0239   VLDL 27 12/22/2013 0239   LDLCALC 48 04/08/2014 1029   LDLCALC 31 12/22/2013 0239   LDLDIRECT 65.1 12/27/2011 1001      Wt Readings from Last 3 Encounters:  05/29/14 117 lb 1.9 oz (53.125 kg)  05/29/14 117 lb (53.071 kg)  04/08/14 116 lb (52.617 kg)         ASSESSMENT AND PLAN:  1. Essential hypertension with hyperdynamic left ventricular ejection fraction 75-80% prior to increasing beta blocker dose. 2. Dyspnea 3. past history of TIAs 4. Parkinsonism 5. recent compression fracture from a fall in July 20 15th 6. chronic kidney  disease stage III 7. Left ventricular diastolic dysfunction by echo 8. Dizzy spells 9. Hypothyroid followed by Dr. Mariea Clonts  Plan: Continue current medication   Current medicines are reviewed at length with the patient today.  The patient does not have concerns regarding medicines.  The following changes have been made:  no change  Labs/ tests ordered today include:   Orders Placed This Encounter  Procedures  . EKG 12-Lead     Disposition: Continue current medication.  Recheck in 4 months for office visit  Signed, [name], MD  05/29/2014 5:28 PM    [place], [place], [place]  [address] Phone: [phone]; Fax: [phone]

## 2014-05-29 NOTE — Patient Instructions (Signed)
  Please complete the assigned speech therapy homework prior to your next session.  

## 2014-05-29 NOTE — Patient Instructions (Signed)
Your physician recommends that you continue on your current medications as directed. Please refer to the Current Medication list given to you today.  Your physician wants you to follow-up in: 4 MONTH OV You will receive a reminder letter in the mail two months in advance. If you don't receive a letter, please call our office to schedule the follow-up appointment.  

## 2014-05-29 NOTE — Therapy (Signed)
[facility] [address] [place], [place], [address] Phone: [phone]   Fax:  [phone]  Speech Language Pathology Treatment  Patient Details  Name: Victoria Lindsey MRN: [id number] Date of Birth: 01-19-1926 Referring Provider:  [name], DO  Encounter Date: 05/29/2014      End of Session - 05/29/14 1059    Visit Number 8   Number of Visits 16   Date for SLP Re-Evaluation 07/07/14   SLP Start Time 40   SLP Stop Time  1101   SLP Time Calculation (min) 41 min   Activity Tolerance Patient tolerated treatment well      Past Medical History  Diagnosis Date  . Hypertension   . Parkinson's disease   . Hyperlipidemia   . Dizziness   . Orthostatic hypotension   . Anemia   . Tremor   . Breast cancer     LEFT BREAST  . Kidney failure     stage 3  . Hypothyroidism   . Prediabetes   . TIA (transient ischemic attack)   . Hyperglycemia   . Left ventricular diastolic dysfunction   . Hypophonia     Past Surgical History  Procedure Laterality Date  . Cardiovascular stress test  06/2007    NORMAL  . Breast lumpectomy  2010  . Cholecystectomy  1994  . Tonsillectomy    . Nasal sinus surgery      submucous resection late 1950s  . Cataract extraction  2005,2006    Dr.Devonzo    There were no vitals filed for this visit.  Visit Diagnosis: Hypokinetic Parkinsonian dysphonia      Subjective Assessment - 05/29/14 1053    Symptoms "You know I'm breathing deeper in bed." Daughter arrived with pt today.               ADULT SLP TREATMENT - 05/29/14 1027    General Information   Behavior/Cognition Alert;Cooperative;Pleasant mood   Treatment Provided   Treatment provided Cognitive-Linquistic   Pain Assessment   Pain Assessment No/denies pain   Cognitive-Linquistic Treatment   Treatment focused on Dysarthria   Skilled Treatment Loud /a/ average 85dB with occasional min A. Structured tasks with  sentence responses with usual min-mod A for breathing and loudness.Pt maintained 70dB 85% of the time with these cues from SLP.     Assessment / Recommendations / Plan   Plan Continue with current plan of care   Progression Toward Goals   Progression toward goals Progressing toward goals          SLP Education - 05/29/14 1059    Education provided Yes   Education Details need to do HEP/homework, breathing necessary prior to talking   Person(s) Educated Patient   Methods Explanation   Comprehension Verbalized understanding          SLP Short Term Goals - 05/29/14 1101    SLP SHORT TERM GOAL #1   Title pt will maintain average of loud /a/ at 80dB for 5 sessions   Time 1   Period Weeks   Status On-going   SLP SHORT TERM GOAL #2   Title pt will produce 16/20 sentences with average 70dB   Time 1   Period Weeks   Status On-going   SLP SHORT TERM GOAL #3   Title pt will improve loudness in 5 minutes simple conversation to 70dB with rare min A   Time 1   Period Weeks   Status On-going  SLP Long Term Goals - 05/29/14 1101    SLP LONG TERM GOAL #1   Title pt will maintain average loudness of 80dB with loud /a/ over 7 sessions   Time 5   Period Weeks   Status On-going   SLP LONG TERM GOAL #2   Title pt will improve loudness to 70dB in 7 minutes simple/mod complex conversation with rare min A   Time 5   Period Weeks   Status On-going   SLP LONG TERM GOAL #3   Title pt will report feeling abdominal movement necessary for louder speech in simple conversation    Time 5   Period Weeks   Status On-going          Plan - 05/29/14 1100    Clinical Impression Statement /a/ increased loudness - usual min-mod A for loudness and breathing during structured speech tasks and conversation. Encouraged pt to use external reminders for loud voice.   Speech Therapy Frequency 2x / week   Duration --  5 weeks   Treatment/Interventions Internal/external aids;Functional  tasks;Cueing hierarchy;Patient/family education;Compensatory strategies;SLP instruction and feedback   Potential to Achieve Goals Good   Potential Considerations Ability to learn/carryover information;Severity of impairments        Problem List Patient Active Problem List   Diagnosis Date Noted  . Dizziness of unknown cause 02/21/2014  . Left ventricular diastolic dysfunction with preserved systolic function 74/10/1446  . Essential hypertension 12/30/2013  . Transaminitis 12/24/2013  . Palpitations 12/23/2013  . Respiratory insufficiency 12/22/2013  . Normocytic anemia 12/21/2013  . Thrombocytopenia 12/21/2013  . Abnormal ECG 12/15/2013  . Abnormal involuntary movement 12/15/2013  . Colorectal polyps 12/15/2013  . Chest pain 12/15/2013  . Arteriosclerosis of coronary artery 12/15/2013  . Chronic kidney disease (CKD), stage III (moderate) 12/15/2013  . Corn 12/15/2013  . Diabetes 12/15/2013  . Breathing difficult 12/15/2013  . Acid reflux 12/15/2013  . HLD (hyperlipidemia) 12/15/2013  . BP (high blood pressure) 12/15/2013  . FOM (frequency of micturition) 12/15/2013  . Breast cancer, female 12/15/2013  . Fungal infection of nail 12/15/2013  . Osteopenia 12/15/2013  . Raynaud's syndrome 12/15/2013  . Restless leg 12/15/2013  . Gougerout-Sjoegren syndrome 12/15/2013  . Change in blood platelet count 12/15/2013  . Infection of urinary tract 12/15/2013  . Lower urinary tract infection 10/12/2013  . Idiopathic Parkinson's disease 09/06/2013  . Bladder spasm 09/05/2013  . Urgency of micturation 09/05/2013  . Dyspnea 06/15/2012  . Hyperglycemia 12/27/2011  . Sjogren's syndrome 12/17/2010  . TIA (transient ischemic attack) 12/17/2010  . Benign hypertensive heart disease without heart failure 09/01/2010  . Parkinson's disease 09/01/2010  . Hypothyroidism 09/01/2010  . Hypercholesterolemia 09/01/2010  . Chronic anemia 09/01/2010    Garald Balding, SLP 05/29/2014, 11:02  AM  [facility] [address] [place], [place], [address] Phone: [phone]   Fax:  [phone]

## 2014-05-29 NOTE — Patient Instructions (Addendum)
-   continue ASA and plavix and crestor for stroke and cardiac prevention - continue BP monitor and BP medications for BP control - follow up with cardiologist for CHF and CAD  - Follow up with your primary care physician for stroke risk factor modification. Recommend maintain blood pressure goal <130/80, diabetes with hemoglobin A1c goal below 6.5% and lipids with LDL cholesterol goal below 70 mg/dL.  - follow up as needed.

## 2014-05-29 NOTE — Therapy (Signed)
[facility] [address] [place], [place], [address] Phone: [phone]   Fax:  [phone]  Physical Therapy Treatment  Patient Details  Name: Victoria Lindsey MRN: [id number] Date of Birth: 09-02-1925 Referring Provider:  Elba Barman, MD  Encounter Date: 05/28/2014      PT End of Session - 05/29/14 1137    Visit Number 15   Number of Visits 17   Date for PT Re-Evaluation 06/03/14   PT Start Time 1020   PT Stop Time 1100   PT Time Calculation (min) 40 min   Activity Tolerance Patient tolerated treatment well   Behavior During Therapy [facility] for tasks assessed/performed      Past Medical History  Diagnosis Date  . Hypertension   . Parkinson's disease   . Hyperlipidemia   . Dizziness   . Orthostatic hypotension   . Anemia   . Tremor   . Breast cancer     LEFT BREAST  . Kidney failure     stage 3  . Hypothyroidism   . Prediabetes   . TIA (transient ischemic attack)   . Hyperglycemia   . Left ventricular diastolic dysfunction   . Hypophonia     Past Surgical History  Procedure Laterality Date  . Cardiovascular stress test  06/2007    NORMAL  . Breast lumpectomy  2010  . Cholecystectomy  1994  . Tonsillectomy    . Nasal sinus surgery      submucous resection late 1950s  . Cataract extraction  2005,2006    Dr.Devonzo    There were no vitals filed for this visit.  Visit Diagnosis:  Bradykinesia  Rigidity  Abnormality of gait      Subjective Assessment - 05/28/14 1025    Symptoms No more stumbles, no pain   Currently in Pain? No/denies            [facility] PT Assessment - 05/28/14 1025    Berg Balance Test   Sit to Stand Able to stand  independently using hands   Standing Unsupported Able to stand 2 minutes with supervision   Sitting with Back Unsupported but Feet Supported on Floor or Stool Able to sit safely and securely 2 minutes   Stand to Sit Controls descent by using hands    Transfers Needs one person to assist  uncontrolled descent into istting   Standing Unsupported with Eyes Closed Able to stand 10 seconds with supervision   Standing Ubsupported with Feet Together Able to place feet together independently and stand for 1 minute with supervision   From Standing, Reach Forward with Outstretched Arm Can reach forward >12 cm safely (5")   From Standing Position, Pick up Object from Floor Unable to try/needs assist to keep balance   From Standing Position, Turn to Look Behind Over each Shoulder Looks behind one side only/other side shows less weight shift   Turn 360 Degrees Able to turn 360 degrees safely but slowly   Standing Unsupported, Alternately Place Feet on Step/Stool Able to complete >2 steps/needs minimal assist   Standing Unsupported, One Foot in Front Able to take small step independently and hold 30 seconds   Standing on One Leg Tries to lift leg/unable to hold 3 seconds but remains standing independently   Total Score 32   Timed Up and Go Test   TUG Normal TUG   Normal TUG (seconds) 32.16  33.16 sec with 4-wheeled RW        Gait activities  with 4-wheeled RW, 100 ft, 200 ft, then 120 ft, with supervision, with starts, stops, obstacle negotiation due to crowded gym area, with cues for increased step length and upright posture.              [facility] Adult PT Treatment/Exercise - 05/28/14 1025    Ambulation/Gait   Gait velocity 18.38 sec=1.8 ft/sec                PT Education - 05/29/14 1137    Education provided Yes   Education Details progress towards goals, improved overall functional mobility; POC; fall prevention education   Person(s) Educated Patient;Spouse   Methods Explanation;Handout   Comprehension Verbalized understanding          PT Short Term Goals - 05/06/14 1256    PT SHORT TERM GOAL #1   Title Pt will perform HEP with supervision for improved strength, balance, and gait. (Target date 05/03/14)   Status Not  Met   PT SHORT TERM GOAL #2   Title Pt will improve Berg Balance score to at least 25/56 for decreased fall risk.   Status Achieved   PT SHORT TERM GOAL #3   Title Pt will improve TUG score to less than or equal to 30 seconds for improved ADL participation in home.   Status Achieved   PT SHORT TERM GOAL #4   Title Pt will perform at least 8 of 10 reps of sit<>stand with minimal UE support from <18 inch surfaces, for improved transfer efficiency and safety.   Status Not Met           PT Long Term Goals - 05/28/14 1049    PT LONG TERM GOAL #1   Title Pt will verbalize understanding of fall prevention techniques within home environment. (Target date 06/03/14)   Status Achieved   PT LONG TERM GOAL #2   Title Pt will improve Berg Balance score to at least 35/56 for decreased fall risk.   Baseline 32/56   Status Not Met   PT LONG TERM GOAL #3   Title Pt will imrpove TUG score to less than or equal to 20 seconds for decreased fall risk.   Baseline TUG 32.16 sec   Status Not Met   PT LONG TERM GOAL #4   Title Pt will improve gait velocity to at least 1.8 ft/sec for improved gait efficiency and safety.   Baseline 1.8 ft/sec   Status Achieved   PT LONG TERM GOAL #5   Status On-going               Plan - 05/29/14 1139    Clinical Impression Statement Long term goals checked today.  Pt has met LTG #1, 4.  LTG #2 and 3 not met; LTG #5 ongoing.  Goals to be revised by next visit.  Pt is noting improved functional mobility and feels she is doing more around her home.  Husband reports "120% improvement" since starting outpatient therapy.  Pt will continue to benefit from further skilled PT to address progression of balance, gait and strength activities for further improved functional mobility and decreased fall risk.   Pt will benefit from skilled therapeutic intervention in order to improve on the following deficits Abnormal gait;Decreased activity tolerance;Decreased balance;Decreased  mobility;Decreased strength;Difficulty walking;Postural dysfunction   Rehab Potential Good   PT Frequency 2x / week   PT Duration 8 weeks  wk 8 of 8; recert to be completed and sent to MD after next visit when goals revised     PT Treatment/Interventions ADLs/Self Care Home Management;Gait training;Stair training;Functional mobility training;Neuromuscular re-education;Balance training;Therapeutic exercise;Therapeutic activities;Patient/family education   PT Next Visit Plan Continue balance, gait, strengthening activities; PT to revise goals and send recert after next visit   Consulted and Agree with Plan of Care Patient;Family member/caregiver        Problem List Patient Active Problem List   Diagnosis Date Noted  . Dizziness of unknown cause 02/21/2014  . Left ventricular diastolic dysfunction with preserved systolic function 01/09/2014  . Essential hypertension 12/30/2013  . Transaminitis 12/24/2013  . Palpitations 12/23/2013  . Respiratory insufficiency 12/22/2013  . Normocytic anemia 12/21/2013  . Thrombocytopenia 12/21/2013  . Abnormal ECG 12/15/2013  . Abnormal involuntary movement 12/15/2013  . Colorectal polyps 12/15/2013  . Chest pain 12/15/2013  . Arteriosclerosis of coronary artery 12/15/2013  . Chronic kidney disease (CKD), stage III (moderate) 12/15/2013  . Corn 12/15/2013  . Diabetes 12/15/2013  . Breathing difficult 12/15/2013  . Acid reflux 12/15/2013  . HLD (hyperlipidemia) 12/15/2013  . BP (high blood pressure) 12/15/2013  . FOM (frequency of micturition) 12/15/2013  . Breast cancer, female 12/15/2013  . Fungal infection of nail 12/15/2013  . Osteopenia 12/15/2013  . Raynaud's syndrome 12/15/2013  . Restless leg 12/15/2013  . Gougerout-Sjoegren syndrome 12/15/2013  . Change in blood platelet count 12/15/2013  . Infection of urinary tract 12/15/2013  . Lower urinary tract infection 10/12/2013  . Idiopathic Parkinson's disease 09/06/2013  . Bladder spasm  09/05/2013  . Urgency of micturation 09/05/2013  . Dyspnea 06/15/2012  . Hyperglycemia 12/27/2011  . Sjogren's syndrome 12/17/2010  . TIA (transient ischemic attack) 12/17/2010  . Benign hypertensive heart disease without heart failure 09/01/2010  . Parkinson's disease 09/01/2010  . Hypothyroidism 09/01/2010  . Hypercholesterolemia 09/01/2010  . Chronic anemia 09/01/2010    , W. 05/29/2014, 11:45 AM   , PT 05/29/2014 11:47 AM Phone: [phone] Fax: [phone]    [facility] [address] [place], [place], [address] Phone: [phone]   Fax:  [phone]      

## 2014-05-29 NOTE — Progress Notes (Signed)
STROKE NEUROLOGY FOLLOW UP NOTE  NAME: Victoria Lindsey DOB: [birth year]  REASON FOR VISIT: stroke follow up HISTORY FROM: pt and daughter and chart  Today we had the pleasure of seeing Victoria Lindsey in follow-up at our Neurology Clinic. Pt was accompanied by daughter.   History Summary Victoria Lindsey is an 81 y.o. female with history of TIA, Parkinson's disease, multiple drug allergies, HTN, HLD, hypothyroidism, Sjogren's syndrome, valvular abnormalities, chronic kidney disease (stage III), and chronic anemiawas admitted on 12/21/13 for CP and SOB. However, she stated that 2 days prior she had transient difficulty in getting words out and right arm/hand felt funny, lasting about one hour and resolved. Similar symptoms years ago. Her family took her BP and noted it was elevated (systolically in the 671'I) all day (but also notes she has been struggling with her BP for about one year and for the last several weeks, her BP was high). On 12/21/13, she also started to have SOB and CP, due to multiple medical issues she was brought to ED. In ER, SBP is at 200s, and pt is asymptomatic. MRI did not show acute infarct, considered TIA with cardioembolic source vs. Hypertensive encephalopathy. She was continued on ASA and plavix and recommended cardiac event monitoring on discharge.  02/19/14 follow up - the patient has been doing well. She was sent to SNF for 4 weeks after discharge. Followed with PCP for BP control and CHF treatment,  and currently BP in good control. Today in clinic 134/59. She feels better overall, but still not getting the cardiac event monitoring yet.  Interval History During the interval time, pt has been doing well, no recurrent stroke-like symptoms.  She had a 30 day cardiac event monitoring which did not show atrial fibrillation. Dr. Lenon Ahmadi that her blood pressure much better controlled at home, ranging from 120-140. Today in clinic her blood pressure is 124/65.  REVIEW OF  SYSTEMS: Full 14 system review of systems performed and notable only for those listed below and in HPI above, all others are negative:  Constitutional: fatigue  Cardiovascular: N/A  Ear/Nose/Throat:  Runny nose  Skin: N/A  Eyes: N/A  Respiratory: SOB  Gastroitestinal:  constipation  Genitourinary: N/A Hematology/Lymphatic: easy bruising  Endocrine: N/A  Musculoskeletal: N/A  Allergy/Immunology: running nose  Neurological:  Speech difficulties, tremor, restless leg, snoring, sleep talking  Psychiatric: N/A  The following represents the patient's updated allergies and side effects list: Allergies  Allergen Reactions  . Aciphex [Rabeprazole Sodium]   . Amantadines   . Avapro [Irbesartan]   . Carbidopa-Levodopa   . Cardura [Doxazosin Mesylate]   . Ciprofloxacin   . Clonidine Derivatives   . Cozaar   . Diltiazem   . Famotidine   . Hctz [Hydrochlorothiazide]   . Indapamide   . Lansoprazole   . Lisinopril     Mouth problems  . Loratadine   . Maxidex [Dexamethasone]   . Mirapex [Pramipexole Dihydrochloride]   . Norvasc [Amlodipine Besylate]   . Pantoprazole Sodium   . Penicillins   . Prilosec [Omeprazole]   . Procardia [Nifedipine]   . Proton Pump Inhibitors   . Sulfa Drugs Cross Reactors   . Trimethoprim   . Zyrtec [Cetirizine Hcl]     The neurologically relevant items on the patient's problem list were reviewed on today's visit.  Neurologic Examination  A problem focused neurological exam (12 or more points of the single system neurologic examination, vital signs counts as 1 point, cranial nerves count  for 8 points) was performed.  Blood pressure 124/65, pulse 78, height 5\' 1"  (1.549 m), weight 117 lb (53.071 kg).  General - thin-built, well developed, in no apparent distress.  Ophthalmologic - not able to see through due to incooperation.  Cardiovascular - Regular rate and rhythm with no murmur.  Mental Status -  Level of arousal and orientation to time,  place, and person were intact. Language including expression, naming, repetition, comprehension was assessed and found intact.  Cranial Nerves II - XII - II - Visual field intact OU. III, IV, VI - Extraocular movements intact. V - Facial sensation intact bilaterally. VII - Facial movement intact bilaterally. VIII - Hearing & vestibular intact bilaterally. X - Palate elevates symmetrically. XI - Chin turning & shoulder shrug intact bilaterally. XII - Tongue protrusion intact.  Motor Strength - The patient's strength was 4/5 in all extremities and pronator drift was absent.  Bulk was normal and fasciculations were absent.   Motor Tone - Muscle tone was assessed at the neck and appendages and was normal.  Reflexes - The patient's reflexes were normal in all extremities and she had no pathological reflexes.  Sensory - Light touch, temperature/pinprick were assessed and were normal.    Coordination - The patient had normal movements in the hands with no ataxia or dysmetria.   Mild resting tremor were present both hands.  Gait and Station -  Difficulty getting up from chair, walking  With walkerslow with shuffling gait.  Data reviewed: I personally reviewed the images and agree with the radiology interpretations.  2-D echo 10/25/2013 Left ventricle: The cavity size was normal. Wall thickness was increased in a pattern of mild LVH. Systolic function was hyperdynamic. The estimated ejection fraction was in the range of 75% to 80%. Wall motion was normal; there were no regional wall motion abnormalities. There was an increased relative contribution of atrial contraction to ventricular filling. Doppler parameters are consistent with abnormal left ventricular relaxation (grade 1 diastolic dysfunction). Aortic valve: There was mild regurgitation. Left atrium: The atrium was mildly dilated.  Dg Chest 2 View 12/21/2013  Chronic lung disease is stable. No superimposed acute  abnormality.   Ct Head Wo Contrast 12/21/2013  Atrophy and chronic microvascular ischemia. No acute abnormality   Mr Brain Wo Contrast 12/21/2013  No acute or subacute infarction. Moderate chronic small-vessel ischemic changes throughout the brain as outlined above.   CUS - Bilateral: 1-39% ICA stenosis. Vertebral artery flow is antegrade.    30 day cardiac event monitoring -  No atrial fibrillation detected  Component     Latest Ref Rng 12/21/2013 12/22/2013  Cholesterol     0 - 200 mg/dL  99  Triglycerides     <150 mg/dL  133  HDL     >39 mg/dL  41  Total CHOL/HDL Ratio       2.4  VLDL     0 - 40 mg/dL  27  LDL (calc)     0 - 99 mg/dL  31  Hgb A1c MFr Bld     <5.7 % 6.4 (H)   Mean Plasma Glucose     <117 mg/dL 137 (H)     Assessment: As you may recall, she is a 81 y.o. Caucasian female with PMH of TIA, Parkinson's disease, multiple drug allergies, HTN, HLD, hypothyroidism, Sjogren's syndrome, valvular abnormalities, chronic kidney disease (stage III), and chronic anemiawas admitted on 12/21/13 for CP and SOB. During the admission, she was found to have very high  BPs and also was evaluated for an episode of expressive aphasia two days prior to the admission. Stroke work up negative and MRI no acute stroke. Due to her age and lack of significant stroke risk factors as well as presentation of aphasia, cardiac event monitoring was done and did not show  A. fib episode. Hypertensive encephalopathy vs. TIA to explain her symptoms.   Plan:  - continue ASA and plavix and crestor for stroke and cardiacprevention - continue BP control and check BP at home - follow up with cardiology for BP control, CAD, and CHF management - Follow up with your primary care physician for stroke risk factor modification. Recommend maintain blood pressure goal <130/80, diabetes with hemoglobin A1c goal below 6.5% and lipids with LDL cholesterol goal below 70 mg/dL.  - RTC PRN.  No orders  of the defined types were placed in this encounter.    Meds ordered this encounter  Medications  . ONETOUCH DELICA LANCETS 79T MISC    Sig: by Does not apply route as directed. Use lancets to check blood sugars  . Probiotic Product (PROBIOTIC PO)    Sig: Take 1 tablet by mouth daily.  . folic acid (FOLVITE) 903 MCG tablet    Sig: Take 400 mcg by mouth daily.    Patient Instructions  - continue ASA and plavix and crestor for stroke and cardiac prevention - continue BP monitor and BP medications for BP control - follow up with cardiologist for CHF and CAD  - Follow up with your primary care physician for stroke risk factor modification. Recommend maintain blood pressure goal <130/80, diabetes with hemoglobin A1c goal below 6.5% and lipids with LDL cholesterol goal below 70 mg/dL.  - follow up as needed.    [name], MD PhD [facility] [address] [place],  [address] [phone]

## 2014-05-30 ENCOUNTER — Ambulatory Visit: Payer: Medicare Other | Admitting: Physical Therapy

## 2014-05-30 ENCOUNTER — Encounter: Payer: Self-pay | Admitting: Occupational Therapy

## 2014-05-30 ENCOUNTER — Other Ambulatory Visit: Payer: Self-pay | Admitting: Cardiology

## 2014-05-30 ENCOUNTER — Ambulatory Visit: Payer: Medicare Other | Admitting: Occupational Therapy

## 2014-05-30 DIAGNOSIS — R278 Other lack of coordination: Secondary | ICD-10-CM

## 2014-05-30 DIAGNOSIS — R269 Unspecified abnormalities of gait and mobility: Secondary | ICD-10-CM

## 2014-05-30 DIAGNOSIS — R29898 Other symptoms and signs involving the musculoskeletal system: Secondary | ICD-10-CM

## 2014-05-30 DIAGNOSIS — R258 Other abnormal involuntary movements: Secondary | ICD-10-CM

## 2014-05-30 DIAGNOSIS — R279 Unspecified lack of coordination: Secondary | ICD-10-CM

## 2014-05-30 DIAGNOSIS — R531 Weakness: Secondary | ICD-10-CM

## 2014-05-30 DIAGNOSIS — R293 Abnormal posture: Secondary | ICD-10-CM

## 2014-05-30 NOTE — Therapy (Signed)
[facility] [address] [place], [place], [address] Phone: [phone]   Fax:  [phone]  Physical Therapy Treatment  Patient Details  Name: Victoria Lindsey MRN: [id number] Date of Birth: 06/14/1925 Referring Provider:  Jacobucci, Nicola J, MD  Encounter Date: 05/30/2014      PT End of Session - 05/30/14 1024    Visit Number 16   Number of Visits 24   Date for PT Re-Evaluation 07/04/14   PT Start Time 0934   PT Stop Time 1014   PT Time Calculation (min) 40 min   Equipment Utilized During Treatment Gait belt   Activity Tolerance Patient tolerated treatment well   Behavior During Therapy WFL for tasks assessed/performed      Past Medical History  Diagnosis Date  . Hypertension   . Parkinson's disease   . Hyperlipidemia   . Dizziness   . Orthostatic hypotension   . Anemia   . Tremor   . Breast cancer     LEFT BREAST  . Kidney failure     stage 3  . Hypothyroidism   . Prediabetes   . TIA (transient ischemic attack)   . Hyperglycemia   . Left ventricular diastolic dysfunction   . Hypophonia     Past Surgical History  Procedure Laterality Date  . Cardiovascular stress test  06/2007    NORMAL  . Breast lumpectomy  2010  . Cholecystectomy  1994  . Tonsillectomy    . Nasal sinus surgery      submucous resection late 1950s  . Cataract extraction  2005,2006    Dr.Devonzo    There were no vitals filed for this visit.  Visit Diagnosis:  Bradykinesia  Rigidity  Abnormality of gait      Subjective Assessment - 05/30/14 0941    Symptoms Everybody tells me I'm in good shape-I'm so much better than I was   Currently in Pain? No/denies                       [facility] Adult PT Treatment/Exercise - 05/30/14 0941    High Level Balance   High Level Balance Activities Side stepping;Backward walking  on foam beam; fwd/back walk in parallel bars 3 x 10 ft   High Level Balance Comments In  parallel bars:  alternating and consecutive step taps to varied height objects with 1-2 UE support  for improved single limb stance   Knee/Hip Exercises: Aerobic   Stationary Bike NuStep, Level 3, 4 extremities x 5 minutes   Knee/Hip Exercises: Standing   Functional Squat 2 sets;10 reps      Forward step over obstacle, consecutive then alternating steps, then side step over obstacle 10 reps each with bilateral UE support.  Standing on foam balance beam with PT providing min assist and cues for decreased posterior lean:  UE alternating UE lifts to decrease UE support, head turns x 5 and head nods x 5, 2 sets; on foam beam, heel/toe raises with focus on forward weightshift, then hip strategy forward and back rocking with min assist and cues.          PT Education - 05/29/14 1137    Education provided Yes   Education Details progress towards goals, improved overall functional mobility; POC; fall prevention education   Person(s) Educated Patient;Spouse   Methods Explanation;Handout   Comprehension Verbalized understanding          PT Short Term Goals - 05/06/14 1256      PT SHORT TERM GOAL #1   Title Pt will perform HEP with supervision for improved strength, balance, and gait. (Target date 05/03/14)   Status Not Met   PT SHORT TERM GOAL #2   Title Pt will improve Berg Balance score to at least 25/56 for decreased fall risk.   Status Achieved   PT SHORT TERM GOAL #3   Title Pt will improve TUG score to less than or equal to 30 seconds for improved ADL participation in home.   Status Achieved   PT SHORT TERM GOAL #4   Title Pt will perform at least 8 of 10 reps of sit<>stand with minimal UE support from <18 inch surfaces, for improved transfer efficiency and safety.   Status Not Met           PT Long Term Goals - 05/30/14 1028    PT LONG TERM GOAL #1   Title Pt will verbalize plans for continued community fitness upon D/C from PT   Time 4   Status New   PT LONG TERM GOAL #2    Title Pt will improve Berg Balance score to at least 37/56 for decreased fall risk.   Time 4   Period Weeks   Status New   PT LONG TERM GOAL #3   Title Pt will improve TUG score to less than or equal to 25 seconds for decreased fall risk.   Time 4   Period Weeks   Status New   PT LONG TERM GOAL #4   Title Pt will improve gait velocity to at least 2 ft/sec for improved gait efficiency and safety.   Time 4   Period Weeks   Status New    The above new long term goals were set on 05/30/14.  Pt had met 2 of 5 previous long term goals.  The above goals are to continue to address/progress patient's strength, balance and gait.           Plan - 05/30/14 1026    Clinical Impression Statement Pt will continue to benefit from further skilled PT to address strength, balance and gait to improve functional mobility and decrease fall risk.  Recert sent to physician today.   Pt will benefit from skilled therapeutic intervention in order to improve on the following deficits Abnormal gait;Decreased activity tolerance;Decreased balance;Decreased mobility;Decreased strength;Difficulty walking;Postural dysfunction   Rehab Potential Good   PT Frequency 2x / week   PT Duration 4 weeks  recert today for 4 additional weeks   PT Treatment/Interventions ADLs/Self Care Home Management;Gait training;Stair training;Functional mobility training;Neuromuscular re-education;Balance training;Therapeutic exercise;Therapeutic activities;Patient/family education   PT Next Visit Plan Continue balance, gait, strengthening activities   Consulted and Agree with Plan of Care Patient;Family member/caregiver        Problem List Patient Active Problem List   Diagnosis Date Noted  . Encephalopathy, hypertensive 05/29/2014  . Dizziness of unknown cause 02/21/2014  . Left ventricular diastolic dysfunction with preserved systolic function 01/09/2014  . Essential hypertension 12/30/2013  . Transaminitis 12/24/2013  .  Palpitations 12/23/2013  . Respiratory insufficiency 12/22/2013  . Normocytic anemia 12/21/2013  . Thrombocytopenia 12/21/2013  . Abnormal ECG 12/15/2013  . Abnormal involuntary movement 12/15/2013  . Colorectal polyps 12/15/2013  . Chest pain 12/15/2013  . Arteriosclerosis of coronary artery 12/15/2013  . Chronic kidney disease (CKD), stage III (moderate) 12/15/2013  . Corn 12/15/2013  . Diabetes 12/15/2013  . Breathing difficult 12/15/2013  . Acid reflux 12/15/2013  . HLD (hyperlipidemia) 12/15/2013  .   BP (high blood pressure) 12/15/2013  . FOM (frequency of micturition) 12/15/2013  . Breast cancer, female 12/15/2013  . Fungal infection of nail 12/15/2013  . Osteopenia 12/15/2013  . Raynaud's syndrome 12/15/2013  . Restless leg 12/15/2013  . Gougerout-Sjoegren syndrome 12/15/2013  . Change in blood platelet count 12/15/2013  . Infection of urinary tract 12/15/2013  . Lower urinary tract infection 10/12/2013  . Idiopathic Parkinson's disease 09/06/2013  . Bladder spasm 09/05/2013  . Urgency of micturation 09/05/2013  . Dyspnea 06/15/2012  . Hyperglycemia 12/27/2011  . Sjogren's syndrome 12/17/2010  . TIA (transient ischemic attack) 12/17/2010  . Benign hypertensive heart disease without heart failure 09/01/2010  . Parkinson's disease 09/01/2010  . Hypothyroidism 09/01/2010  . Hypercholesterolemia 09/01/2010  . Chronic anemia 09/01/2010    , W. 05/30/2014, 10:32 AM   , PT 05/30/2014 10:38 AM Phone: [phone] Fax: [phone]   [facility] [address] [place], [place], [address] Phone: [phone]   Fax:  [phone]      

## 2014-05-30 NOTE — Therapy (Signed)
[facility] [address] [place], [place], [address] Phone: [phone]   Fax:  [phone]  Occupational Therapy Evaluation  Patient Details  Name: Victoria Lindsey MRN: [id number] Date of Birth: Mar 24, 1925 Referring Provider:  Elba Barman, MD  Encounter Date: 05/30/2014      OT End of Session - 05/30/14 1027    Visit Number 16   Number of Visits 31  15+16=31   Date for OT Re-Evaluation 07/27/14   Authorization Type Medicare   Authorization Time Period G code needed, renewed 05/28/14   Authorization - Visit Number 16   Authorization - Number of Visits 20   OT Start Time 1021   OT Stop Time 1100   OT Time Calculation (min) 39 min   Activity Tolerance Patient tolerated treatment well   Behavior During Therapy [facility] for tasks assessed/performed      Past Medical History  Diagnosis Date  . Hypertension   . Parkinson's disease   . Hyperlipidemia   . Dizziness   . Orthostatic hypotension   . Anemia   . Tremor   . Breast cancer     LEFT BREAST  . Kidney failure     stage 3  . Hypothyroidism   . Prediabetes   . TIA (transient ischemic attack)   . Hyperglycemia   . Left ventricular diastolic dysfunction   . Hypophonia     Past Surgical History  Procedure Laterality Date  . Cardiovascular stress test  06/2007    NORMAL  . Breast lumpectomy  2010  . Cholecystectomy  1994  . Tonsillectomy    . Nasal sinus surgery      submucous resection late 1950s  . Cataract extraction  2005,2006    Dr.Devonzo    There were no vitals filed for this visit.  Visit Diagnosis:  Bradykinesia  Rigidity  Decreased coordination  Weakness  Posture abnormality      Subjective Assessment - 05/30/14 1027    Symptoms I've been doing a little more   Currently in Pain? No/denies                     OT Treatments/Exercises (OP) - 05/30/14 1030    Fine Motor Coordination   Purdue Pegboard  completed with each hand with min difficulty for coordination.  Removing with each finger and thumb with PWR! hands with mod cues.   Functional Reaching Activities   High Level reaching behind and overhead with trunk rotation, wt. shifts to pick up 2 large pegs and manipulate in hand to place in vertical pegboard for increased coordination and big movements.          PWR [facility]) - 05/30/14 1054    PWR! Rock x10   PWR! Twist x10   Comments in modified quadraped with min-mod cues for big movements   PWR! Up x10   PWR! Rock x10   PWR! Twist x10    Comments in supine with min-mod cues for big movements               OT Short Term Goals - 05/28/14 1113    OT SHORT TERM GOAL #1   Title I with inital HEP.   Baseline ck 05/03/14   Time 4   Period Weeks   Status Achieved   OT SHORT TERM GOAL #2   Title Pt / family will verbalize understanding of adapted strategies for ADLS/ IADLS.   Time 4   Period Weeks  Status Achieved   OT SHORT TERM GOAL #3   Title Pt will demonstrate improved indpendence with dressing as evidenced by decreasing PPT #4 to 45 secs or less.   Baseline 05/28/14-36.79 secs   Time 4   Period Weeks   Status Achieved  not met 04/29/14:  53.66sec   OT SHORT TERM GOAL #4   Title Pt will demonstrate ability to write 4 sentences with 100 % legibility and minimal decrease in letter size.   Baseline 100% legibility and no significant decrease in size, 05/28/14   Time 4   Period Weeks   Status Achieved  Not met 04/29/14--95% legibility and min-mod decrease in size   OT SHORT TERM GOAL #5   Title Upgraded goal- Pt will demonstrate improved indpendence with dressing as evidenced by performing PPT #4 in 30 secs without LOB.   Time 4   Period Weeks   Status New   Additional Short Term Goals   Additional Short Term Goals Yes   OT SHORT TERM GOAL #6   Title Pt will perform simple stove top cooking task with close supervision.   Baseline due 06/27/14   Time 4   Period  Weeks   Status New           OT Long Term Goals - 05/28/14 1113    OT LONG TERM GOAL #1   Title Pt will perfom dressing with supervision/set up.   Baseline ck 06/01/14   Time 8   Period Weeks   Status Achieved   OT LONG TERM GOAL #2   Title Pt will perform light home management/ snack prep with min A.   Baseline pt makes her own sausage biscuit and clleans out sink.   Time 8   Period Weeks   Status Achieved   OT LONG TERM GOAL #3   Title Pt will demonstrate improved standing balance for ADLS as evidenced by increasing standing functional reach by 3 inches bilaterally.(see below for revised goal on 05/28/14)   Baseline 05/28/14-RUE 8.5, LUE 9.25- met for LUE , RUE ongoing, revised goal : increase RUE standing functional reach to 9.5 inches for increased standing balance for ADLs.   Time 8   Period Weeks   Status On-going   OT LONG TERM GOAL #4   Title Pt will demonstrate increased ease with feeding as evidenced by decreasing PPT #2 to 10 secs or less.   Baseline 12.91 secs- 05/28/14, not fully met, continue goal   Time 8   Period Weeks   Status On-going   OT LONG TERM GOAL #5   Title Pt/ family will verbalize understanding of compensatory strategies for cognition and ways to keep thinking skills sharp   Baseline handout issued 05/24/14   Time 8   Period Weeks   Status Achieved   Long Term Additional Goals   Additional Long Term Goals Yes   OT LONG TERM GOAL #6   Title Pt will perform basic cooking/ home management in standing at a modified independent level   Time 8   Period Weeks   Status New               Plan - 05/30/14 1055    Clinical Impression Statement Pt demo good response to cueing for big movments.   Plan big movements, ADLs, coordination   Consulted and Agree with Plan of Care Patient;Family member/caregiver   Family Member Consulted (husband present)        Problem List Patient Active Problem List  Diagnosis Date Noted  . Encephalopathy,  hypertensive 05/29/2014  . Dizziness of unknown cause 02/21/2014  . Left ventricular diastolic dysfunction with preserved systolic function 90/37/9558  . Essential hypertension 12/30/2013  . Transaminitis 12/24/2013  . Palpitations 12/23/2013  . Respiratory insufficiency 12/22/2013  . Normocytic anemia 12/21/2013  . Thrombocytopenia 12/21/2013  . Abnormal ECG 12/15/2013  . Abnormal involuntary movement 12/15/2013  . Colorectal polyps 12/15/2013  . Chest pain 12/15/2013  . Arteriosclerosis of coronary artery 12/15/2013  . Chronic kidney disease (CKD), stage III (moderate) 12/15/2013  . Corn 12/15/2013  . Diabetes 12/15/2013  . Breathing difficult 12/15/2013  . Acid reflux 12/15/2013  . HLD (hyperlipidemia) 12/15/2013  . BP (high blood pressure) 12/15/2013  . FOM (frequency of micturition) 12/15/2013  . Breast cancer, female 12/15/2013  . Fungal infection of nail 12/15/2013  . Osteopenia 12/15/2013  . Raynaud's syndrome 12/15/2013  . Restless leg 12/15/2013  . Gougerout-Sjoegren syndrome 12/15/2013  . Change in blood platelet count 12/15/2013  . Infection of urinary tract 12/15/2013  . Lower urinary tract infection 10/12/2013  . Idiopathic Parkinson's disease 09/06/2013  . Bladder spasm 09/05/2013  . Urgency of micturation 09/05/2013  . Dyspnea 06/15/2012  . Hyperglycemia 12/27/2011  . Sjogren's syndrome 12/17/2010  . TIA (transient ischemic attack) 12/17/2010  . Benign hypertensive heart disease without heart failure 09/01/2010  . Parkinson's disease 09/01/2010  . Hypothyroidism 09/01/2010  . Hypercholesterolemia 09/01/2010  . Chronic anemia 09/01/2010    [facility] 05/30/2014, 11:01 AM  [facility] [address] [place], [place], [address] Phone: [phone]   Fax:  [place], OTR/L 05/30/2014 11:01 AM   Vianne Bulls, OTR/L 05/30/2014 1:00 PM

## 2014-05-31 ENCOUNTER — Ambulatory Visit: Payer: Medicare Other | Admitting: Physical Therapy

## 2014-05-31 ENCOUNTER — Encounter: Payer: Medicare Other | Admitting: Occupational Therapy

## 2014-06-04 ENCOUNTER — Ambulatory Visit: Payer: Medicare Other

## 2014-06-04 ENCOUNTER — Ambulatory Visit: Payer: Medicare Other | Admitting: Occupational Therapy

## 2014-06-04 ENCOUNTER — Ambulatory Visit: Payer: Medicare Other | Admitting: Physical Therapy

## 2014-06-04 DIAGNOSIS — R490 Dysphonia: Secondary | ICD-10-CM

## 2014-06-04 DIAGNOSIS — G20 Parkinson's disease: Secondary | ICD-10-CM

## 2014-06-04 DIAGNOSIS — R29898 Other symptoms and signs involving the musculoskeletal system: Secondary | ICD-10-CM

## 2014-06-04 DIAGNOSIS — R278 Other lack of coordination: Secondary | ICD-10-CM

## 2014-06-04 DIAGNOSIS — R279 Unspecified lack of coordination: Secondary | ICD-10-CM

## 2014-06-04 DIAGNOSIS — R293 Abnormal posture: Secondary | ICD-10-CM

## 2014-06-04 DIAGNOSIS — R531 Weakness: Secondary | ICD-10-CM

## 2014-06-04 DIAGNOSIS — R258 Other abnormal involuntary movements: Secondary | ICD-10-CM

## 2014-06-04 NOTE — Patient Instructions (Signed)
You must complete loud "ah"s 5 times, twice a day. This should help you with your consistency with louder talking.

## 2014-06-04 NOTE — Therapy (Signed)
[facility] [address] [place], [place], [address] Phone: [phone]   Fax:  [phone]  Occupational Therapy Treatment  Patient Details  Name: Victoria Lindsey MRN: [id number] Date of Birth: 11/20/1925 Referring Provider:  Elba Barman, MD  Encounter Date: 06/04/2014      OT End of Session - 06/04/14 1108    Visit Number 17   Number of Visits 31   Date for OT Re-Evaluation 07/27/14   Authorization Type Medicare   Authorization Time Period G code needed, renewed 05/28/14   OT Start Time 1104   OT Stop Time 1145   OT Time Calculation (min) 41 min   Activity Tolerance Patient tolerated treatment well   Behavior During Therapy [facility] for tasks assessed/performed      Past Medical History  Diagnosis Date  . Hypertension   . Parkinson's disease   . Hyperlipidemia   . Dizziness   . Orthostatic hypotension   . Anemia   . Tremor   . Breast cancer     LEFT BREAST  . Kidney failure     stage 3  . Hypothyroidism   . Prediabetes   . TIA (transient ischemic attack)   . Hyperglycemia   . Left ventricular diastolic dysfunction   . Hypophonia     Past Surgical History  Procedure Laterality Date  . Cardiovascular stress test  06/2007    NORMAL  . Breast lumpectomy  2010  . Cholecystectomy  1994  . Tonsillectomy    . Nasal sinus surgery      submucous resection late 1950s  . Cataract extraction  2005,2006    Dr.Devonzo    There were no vitals filed for this visit.  Visit Diagnosis:  Weakness  Decreased coordination  Rigidity  Bradykinesia  Posture abnormality      Subjective Assessment - 06/04/14 1108    Currently in Pain? Yes   Pain Score 4    Pain Location Hip   Pain Orientation Right   Pain Descriptors / Indicators Aching   Aggravating Factors  walking   Pain Relieving Factors sitting   Multiple Pain Sites No                    OT Treatments/Exercises (OP) - 06/04/14  0001    Functional Reaching Activities   High Level reaching in standing to place large pegs in vertical pegboard with bilateral UE's, min A for blalance, 2 LOB  min v.c. for large amplitude movements and in hand manipulation           PWR [facility]) - 06/04/14 1342    PWR! exercises Moves in supine   PWR! Up 10   PWR! Rock 10   PWR! Twist 10   Comments min v.c.  Attempted modified quadraped yet pt reported hip pain therefore discontinued               OT Short Term Goals - 05/28/14 1113    OT SHORT TERM GOAL #1   Title I with inital HEP.   Baseline ck 05/03/14   Time 4   Period Weeks   Status Achieved   OT SHORT TERM GOAL #2   Title Pt / family will verbalize understanding of adapted strategies for ADLS/ IADLS.   Time 4   Period Weeks   Status Achieved   OT SHORT TERM GOAL #3   Title Pt will demonstrate improved indpendence with dressing as evidenced by decreasing PPT #4 to  45 secs or less.   Baseline 05/28/14-36.79 secs   Time 4   Period Weeks   Status Achieved  not met 04/29/14:  53.66sec   OT SHORT TERM GOAL #4   Title Pt will demonstrate ability to write 4 sentences with 100 % legibility and minimal decrease in letter size.   Baseline 100% legibility and no significant decrease in size, 05/28/14   Time 4   Period Weeks   Status Achieved  Not met 04/29/14--95% legibility and min-mod decrease in size   OT SHORT TERM GOAL #5   Title Upgraded goal- Pt will demonstrate improved indpendence with dressing as evidenced by performing PPT #4 in 30 secs without LOB.   Time 4   Period Weeks   Status New   Additional Short Term Goals   Additional Short Term Goals Yes   OT SHORT TERM GOAL #6   Title Pt will perform simple stove top cooking task with close supervision.   Baseline due 06/27/14   Time 4   Period Weeks   Status New           OT Long Term Goals - 05/28/14 1113    OT LONG TERM GOAL #1   Title Pt will perfom dressing with supervision/set up.    Baseline ck 06/01/14   Time 8   Period Weeks   Status Achieved   OT LONG TERM GOAL #2   Title Pt will perform light home management/ snack prep with min A.   Baseline pt makes her own sausage biscuit and clleans out sink.   Time 8   Period Weeks   Status Achieved   OT LONG TERM GOAL #3   Title Pt will demonstrate improved standing balance for ADLS as evidenced by increasing standing functional reach by 3 inches bilaterally.(see below for revised goal on 05/28/14)   Baseline 05/28/14-RUE 8.5, LUE 9.25- met for LUE , RUE ongoing, revised goal : increase RUE standing functional reach to 9.5 inches for increased standing balance for ADLs.   Time 8   Period Weeks   Status On-going   OT LONG TERM GOAL #4   Title Pt will demonstrate increased ease with feeding as evidenced by decreasing PPT #2 to 10 secs or less.   Baseline 12.91 secs- 05/28/14, not fully met, continue goal   Time 8   Period Weeks   Status On-going   OT LONG TERM GOAL #5   Title Pt/ family will verbalize understanding of compensatory strategies for cognition and ways to keep thinking skills sharp   Baseline handout issued 05/24/14   Time 8   Period Weeks   Status Achieved   Long Term Additional Goals   Additional Long Term Goals Yes   OT LONG TERM GOAL #6   Title Pt will perform basic cooking/ home management in standing at a modified independent level   Time 8   Period Weeks   Status New               Plan - 06/04/14 1339    Clinical Impression Statement Pt is progressing towards goals yet she was limited initally today by hip pain.   Rehab Potential Good   Clinical Impairments Affecting Rehab Potential decreased strength, decreased coordination, bradykinesia   OT Frequency 2x / week   OT Duration 8 weeks   OT Treatment/Interventions Self-care/ADL training;Fluidtherapy;DME and/or AE instruction;Splinting;Patient/family education;Balance training;Therapeutic exercises;Ultrasound;Therapeutic exercise;Therapeutic  activities;Cognitive remediation/compensation;Passive range of motion;Functional Mobility Training;Neuromuscular education;Iontophoresis;Parrafin;Energy conservation;Manual Therapy;Visual/perceptual remediation/compensation   Plan large  amplitude movements, ADLS/ light cooking?   OT Home Exercise Plan  HEP issued-PWR seated, coordination, bag exercises (crumple, behind head, behind back, in fron pass)   Consulted and Agree with Plan of Care Patient;Family member/caregiver   Family Member Consulted (husband present)        Problem List Patient Active Problem List   Diagnosis Date Noted  . Encephalopathy, hypertensive 05/29/2014  . Dizziness of unknown cause 02/21/2014  . Left ventricular diastolic dysfunction with preserved systolic function 67/28/9791  . Essential hypertension 12/30/2013  . Transaminitis 12/24/2013  . Palpitations 12/23/2013  . Respiratory insufficiency 12/22/2013  . Normocytic anemia 12/21/2013  . Thrombocytopenia 12/21/2013  . Abnormal ECG 12/15/2013  . Abnormal involuntary movement 12/15/2013  . Colorectal polyps 12/15/2013  . Chest pain 12/15/2013  . Arteriosclerosis of coronary artery 12/15/2013  . Chronic kidney disease (CKD), stage III (moderate) 12/15/2013  . Corn 12/15/2013  . Diabetes 12/15/2013  . Breathing difficult 12/15/2013  . Acid reflux 12/15/2013  . HLD (hyperlipidemia) 12/15/2013  . BP (high blood pressure) 12/15/2013  . FOM (frequency of micturition) 12/15/2013  . Breast cancer, female 12/15/2013  . Fungal infection of nail 12/15/2013  . Osteopenia 12/15/2013  . Raynaud's syndrome 12/15/2013  . Restless leg 12/15/2013  . Gougerout-Sjoegren syndrome 12/15/2013  . Change in blood platelet count 12/15/2013  . Infection of urinary tract 12/15/2013  . Lower urinary tract infection 10/12/2013  . Idiopathic Parkinson's disease 09/06/2013  . Bladder spasm 09/05/2013  . Urgency of micturation 09/05/2013  . Dyspnea 06/15/2012  . Hyperglycemia  12/27/2011  . Sjogren's syndrome 12/17/2010  . TIA (transient ischemic attack) 12/17/2010  . Benign hypertensive heart disease without heart failure 09/01/2010  . Parkinson's disease 09/01/2010  . Hypothyroidism 09/01/2010  . Hypercholesterolemia 09/01/2010  . Chronic anemia 09/01/2010    RINE,KATHRYN 06/04/2014, 1:45 PM Theone Murdoch, OTR/L [phone] Phone: [phone] 1:45 PM 06/04/2014 [facility] [address] [place], [place], [address] Phone: [phone]   Fax:  [phone]

## 2014-06-04 NOTE — Therapy (Signed)
[facility] [address] [place], [place], [address] Phone: [phone]   Fax:  [phone]  Speech Language Pathology Treatment  Patient Details  Name: Victoria Lindsey MRN: [id number] Date of Birth: 02-Sep-1925 Referring Provider:  Kermit Balo, DO  Encounter Date: 06/04/2014      End of Session - 06/04/14 1057    Visit Number 9   Number of Visits 16   Date for SLP Re-Evaluation 07/07/14   SLP Start Time 1019   SLP Stop Time  1100   SLP Time Calculation (min) 41 min   Activity Tolerance Patient tolerated treatment well      Past Medical History  Diagnosis Date  . Hypertension   . Parkinson's disease   . Hyperlipidemia   . Dizziness   . Orthostatic hypotension   . Anemia   . Tremor   . Breast cancer     LEFT BREAST  . Kidney failure     stage 3  . Hypothyroidism   . Prediabetes   . TIA (transient ischemic attack)   . Hyperglycemia   . Left ventricular diastolic dysfunction   . Hypophonia     Past Surgical History  Procedure Laterality Date  . Cardiovascular stress test  06/2007    NORMAL  . Breast lumpectomy  2010  . Cholecystectomy  1994  . Tonsillectomy    . Nasal sinus surgery      submucous resection late 1950s  . Cataract extraction  2005,2006    Dr.Devonzo    There were no vitals filed for this visit.  Visit Diagnosis: Hypokinetic Parkinsonian dysphonia      Subjective Assessment - 06/04/14 1023    Symptoms "It went how it usually does (over the weekend. ...usual is good."               ADULT SLP TREATMENT - 06/04/14 1024    General Information   Behavior/Cognition Alert;Cooperative;Pleasant mood   Treatment Provided   Treatment provided Cognitive-Linquistic   Pain Assessment   Pain Assessment No/denies pain   Cognitive-Linquistic Treatment   Treatment focused on Dysarthria   Skilled Treatment Loud /a/ with average 83dB and occasional min A for loudness. Once per  day practicing, SLP told pt goal is x2/day. Pt reports her husband reminds pt to use louder speech with everything pt says. SLP told pt she could ensure BID HEP completion and that may well help pt's consistency with loudness. Sentence tasks today with consistent min-mod A for loudness and breathing. Sentence completion tasks today, due to pt having consistent difficulty with full-sentence tasks, completed with 50% success for volume >/= 70dB and proper breath support. SLP provided a statement/question and pt responded with info provided in the statement with volume >/= 70dB after being reminded she needed to do so, 70% of the time. Pt told SLP three items in category with consistent cues for loudness/breathing. "My mind's not good today. I can't do two things at one time!"    Assessment / Recommendations / Plan   Plan Continue with current plan of care   Progression Toward Goals   Progression toward goals --  Pt with difficulty with progress in most/all speech tasks.           SLP Education - 06/04/14 1056    Education provided Yes   Education Details HEP needed twice a day, need for incr'd breath support prior to speaking   Person(s) Educated Patient   Methods Explanation;Demonstration;Verbal cues  Comprehension Verbalized understanding;Verbal cues required;Returned demonstration          SLP Short Term Goals - 06/04/14 1058    SLP SHORT TERM GOAL #1   Title pt will maintain average of loud /a/ at 80dB for 5 sessions   Time --   Period --   Status Not Met  cues needed   SLP SHORT TERM GOAL #2   Title pt will produce 16/20 sentences with average 70dB   Time --   Period --   Status Not Met   SLP SHORT TERM GOAL #3   Title pt will improve loudness in 5 minutes simple conversation to 70dB with rare min A   Time --   Period --   Status Not Met          SLP Long Term Goals - 06/04/14 1100    SLP LONG TERM GOAL #1   Title pt will maintain average loudness of 80dB with loud  /a/ over 7 sessions with rare min A   Time 4   Period Weeks   Status Revised   SLP LONG TERM GOAL #2   Title pt will improve loudness to 70dB in 5 minutes simple/mod complex conversation with rare min A   Time 4   Period Weeks   Status Revised   SLP LONG TERM GOAL #3   Title pt will report feeling abdominal movement necessary for louder speech in simple conversation with occasional min A   Time 4   Period Weeks   Status Revised          Plan - 06/04/14 1057    Clinical Impression Statement Pt with progress with loud /a/ at this time. Minimal progress with most speech tasks after 4 weeks.   Speech Therapy Frequency 2x / week   Duration 4 weeks   Treatment/Interventions Internal/external aids;Functional tasks;Cueing hierarchy;Patient/family education;Compensatory strategies;SLP instruction and feedback   Potential to Achieve Goals Good   Potential Considerations Ability to learn/carryover information;Severity of impairments        Problem List Patient Active Problem List   Diagnosis Date Noted  . Encephalopathy, hypertensive 05/29/2014  . Dizziness of unknown cause 02/21/2014  . Left ventricular diastolic dysfunction with preserved systolic function 74/10/1446  . Essential hypertension 12/30/2013  . Transaminitis 12/24/2013  . Palpitations 12/23/2013  . Respiratory insufficiency 12/22/2013  . Normocytic anemia 12/21/2013  . Thrombocytopenia 12/21/2013  . Abnormal ECG 12/15/2013  . Abnormal involuntary movement 12/15/2013  . Colorectal polyps 12/15/2013  . Chest pain 12/15/2013  . Arteriosclerosis of coronary artery 12/15/2013  . Chronic kidney disease (CKD), stage III (moderate) 12/15/2013  . Corn 12/15/2013  . Diabetes 12/15/2013  . Breathing difficult 12/15/2013  . Acid reflux 12/15/2013  . HLD (hyperlipidemia) 12/15/2013  . BP (high blood pressure) 12/15/2013  . FOM (frequency of micturition) 12/15/2013  . Breast cancer, female 12/15/2013  . Fungal infection  of nail 12/15/2013  . Osteopenia 12/15/2013  . Raynaud's syndrome 12/15/2013  . Restless leg 12/15/2013  . Gougerout-Sjoegren syndrome 12/15/2013  . Change in blood platelet count 12/15/2013  . Infection of urinary tract 12/15/2013  . Lower urinary tract infection 10/12/2013  . Idiopathic Parkinson's disease 09/06/2013  . Bladder spasm 09/05/2013  . Urgency of micturation 09/05/2013  . Dyspnea 06/15/2012  . Hyperglycemia 12/27/2011  . Sjogren's syndrome 12/17/2010  . TIA (transient ischemic attack) 12/17/2010  . Benign hypertensive heart disease without heart failure 09/01/2010  . Parkinson's disease 09/01/2010  . Hypothyroidism 09/01/2010  . Hypercholesterolemia  09/01/2010  . Chronic anemia 09/01/2010    Garald Balding, SLP 06/04/2014, 11:02 AM  [facility] [address] [place], [place], [address] Phone: [phone]   Fax:  [phone]

## 2014-06-04 NOTE — Patient Instructions (Signed)
Supine Knee to Chest   Lie on back. Gently pull one knee toward chest. Hold 15-30___ seconds.  Repeat _3__ times per session. Do _1-2_ sessions per day.  Copyright  VHI. All rights reserved.  Lower Trunk Rotation Stretch   Keeping back flat and feet together, rotate knees to left side. Hold _15-30___ seconds. Repeat __3__ times per set.  Do _1-2___ sessions per day.  http://orth.exer.us/123   Copyright  VHI. All rights reserved.  Piriformis Stretch (Supine)   Grasp right ankle and knee and place over your left bent knee (like a gentleman crossing his legs).   Push your knee out and down until you feel a gentle stretch. Hold _15-30___ seconds. Repeat __3__ times. Do _1-2___ sessions per day.  Copyright  VHI. All rights reserved.   ICE MASSAGE -Fill a styrofoam cup 1/2 full with water and freeze completely. -Once the water is frozen, gently pull the top part of the styrofoam off and away until you get to the ice.  With your clothing of your lower body removed, have a family member gently massage the ice on the area of pain on your buttocks for up to 5 minutes.  This may be nice to do after you have completed the stretch above.

## 2014-06-05 NOTE — Therapy (Signed)
[facility] [address] [place], [place], [address] Phone: [phone]   Fax:  [phone]  Physical Therapy Treatment  Patient Details  Name: Victoria Lindsey MRN: [id number] Date of Birth: 1925/10/29 Referring Provider:  Emilio Aspen, MD  Encounter Date: 06/04/2014      PT End of Session - 06/05/14 1726    Visit Number 17   Number of Visits 24   Date for PT Re-Evaluation 07/04/14   PT Start Time 1152   PT Stop Time 1231   PT Time Calculation (min) 39 min   Activity Tolerance Patient tolerated treatment well   Behavior During Therapy [facility] for tasks assessed/performed      Past Medical History  Diagnosis Date  . Hypertension   . Parkinson's disease   . Hyperlipidemia   . Dizziness   . Orthostatic hypotension   . Anemia   . Tremor   . Breast cancer     LEFT BREAST  . Kidney failure     stage 3  . Hypothyroidism   . Prediabetes   . TIA (transient ischemic attack)   . Hyperglycemia   . Left ventricular diastolic dysfunction   . Hypophonia     Past Surgical History  Procedure Laterality Date  . Cardiovascular stress test  06/2007    NORMAL  . Breast lumpectomy  2010  . Cholecystectomy  1994  . Tonsillectomy    . Nasal sinus surgery      submucous resection late 1950s  . Cataract extraction  2005,2006    Dr.Devonzo    There were no vitals filed for this visit.  Visit Diagnosis:  Rigidity  Posture abnormality      Subjective Assessment - 06/04/14 1152    Symptoms Back and hip are bothering me more than usual   Currently in Pain? Yes   Pain Score 4    Pain Location Hip   Pain Orientation Right   Pain Descriptors / Indicators Aching   Pain Type Chronic pain   Aggravating Factors  starting to walk   Pain Relieving Factors sitting and lying down      Therapeutic Exercise:  Exercises and stretches attempted today to alleviate low back/hip pain:  Single knee to chest 3 reps x 30  seconds each leg, trunk rotation 3 reps x 30 seconds each side, pelvic tilt in supine with tactile and verbal cues for increased pelvic mobility.  Pt noted to have decreased pelvic mobility in attempts at pelvic tilts.    Pt complains of increased pain in area of piriformis on R side, with "shooting" pain into back of leg at attempts with bed mobility supine <>sit, despite using proper bed mobility (log roll) technique, x 2 reps.  Seated lateral weightshifting with cues for improved scooting forward and back onto mat for positioning.  Seated pelvic tilt activity x 10 reps with tactile cues.  Supine piriformis stretch (crossing R leg over left) 3 x 30 seconds.   Discussed possibility of piriformis involvement, and PT recommended use of ice massage at area to address/calm pain.  See education section below.                       PT Education - 06/05/14 1725    Education provided Yes   Education Details HEP; use of ice massage to R piriformis area for pain control   Person(s) Educated Patient;Spouse   Methods Explanation;Handout;Demonstration   Comprehension Verbalized understanding;Returned demonstration  PT Short Term Goals - 05/06/14 1256    PT SHORT TERM GOAL #1   Title Pt will perform HEP with supervision for improved strength, balance, and gait. (Target date 05/03/14)   Status Not Met   PT SHORT TERM GOAL #2   Title Pt will improve Berg Balance score to at least 25/56 for decreased fall risk.   Status Achieved   PT SHORT TERM GOAL #3   Title Pt will improve TUG score to less than or equal to 30 seconds for improved ADL participation in home.   Status Achieved   PT SHORT TERM GOAL #4   Title Pt will perform at least 8 of 10 reps of sit<>stand with minimal UE support from <18 inch surfaces, for improved transfer efficiency and safety.   Status Not Met           PT Long Term Goals - 05/30/14 1028    PT LONG TERM GOAL #1   Title Pt will verbalize plans  for continued community fitness upon D/C from PT   Time 4   Status New   PT LONG TERM GOAL #2   Title Pt will improve Berg Balance score to at least 37/56 for decreased fall risk.   Time 4   Period Weeks   Status New   PT LONG TERM GOAL #3   Title Pt will improve TUG score to less than or equal to 25 seconds for decreased fall risk.   Time 4   Period Weeks   Status New   PT LONG TERM GOAL #4   Title Pt will improve gait velocity to at least 2 ft/sec for improved gait efficiency and safety.   Time 4   Period Weeks   Status New               Plan - 06/05/14 1726    Clinical Impression Statement Pt's R hip pain may be from piriformis involvement.  Spent time today during session addressing through stretching, pelvic mobility exercises and discussion on use of ice massage.  Pt will continue to benefit from further skilled PT to address strength, balance, and gait.   Pt will benefit from skilled therapeutic intervention in order to improve on the following deficits Abnormal gait;Decreased activity tolerance;Decreased balance;Decreased mobility;Decreased strength;Difficulty walking;Postural dysfunction   Rehab Potential Good   PT Frequency 2x / week   PT Duration 4 weeks  4 additional weeks; this is wk 1 of 4   PT Treatment/Interventions ADLs/Self Care Home Management;Gait training;Stair training;Functional mobility training;Neuromuscular re-education;Balance training;Therapeutic exercise;Therapeutic activities;Patient/family education   PT Next Visit Plan Continue balance, gait, strengthening activities; check on hip pain/effectiveness of ice massage and stretches   Consulted and Agree with Plan of Care Patient;Family member/caregiver   Family Member Consulted husband        Problem List Patient Active Problem List   Diagnosis Date Noted  . Encephalopathy, hypertensive 05/29/2014  . Dizziness of unknown cause 02/21/2014  . Left ventricular diastolic dysfunction with  preserved systolic function 21/97/5883  . Essential hypertension 12/30/2013  . Transaminitis 12/24/2013  . Palpitations 12/23/2013  . Respiratory insufficiency 12/22/2013  . Normocytic anemia 12/21/2013  . Thrombocytopenia 12/21/2013  . Abnormal ECG 12/15/2013  . Abnormal involuntary movement 12/15/2013  . Colorectal polyps 12/15/2013  . Chest pain 12/15/2013  . Arteriosclerosis of coronary artery 12/15/2013  . Chronic kidney disease (CKD), stage III (moderate) 12/15/2013  . Corn 12/15/2013  . Diabetes 12/15/2013  . Breathing difficult 12/15/2013  . Acid  reflux 12/15/2013  . HLD (hyperlipidemia) 12/15/2013  . BP (high blood pressure) 12/15/2013  . FOM (frequency of micturition) 12/15/2013  . Breast cancer, female 12/15/2013  . Fungal infection of nail 12/15/2013  . Osteopenia 12/15/2013  . Raynaud's syndrome 12/15/2013  . Restless leg 12/15/2013  . Gougerout-Sjoegren syndrome 12/15/2013  . Change in blood platelet count 12/15/2013  . Infection of urinary tract 12/15/2013  . Lower urinary tract infection 10/12/2013  . Idiopathic Parkinson's disease 09/06/2013  . Bladder spasm 09/05/2013  . Urgency of micturation 09/05/2013  . Dyspnea 06/15/2012  . Hyperglycemia 12/27/2011  . Sjogren's syndrome 12/17/2010  . TIA (transient ischemic attack) 12/17/2010  . Benign hypertensive heart disease without heart failure 09/01/2010  . Parkinson's disease 09/01/2010  . Hypothyroidism 09/01/2010  . Hypercholesterolemia 09/01/2010  . Chronic anemia 09/01/2010    Richele Strand W. 06/05/2014, 5:29 PM  Mady Haagensen, PT 06/05/2014 5:30 PM Phone: [phone] Fax: [facility] [address] [place], [place], [address] Phone: [phone]   Fax:  [phone]

## 2014-06-07 ENCOUNTER — Ambulatory Visit: Payer: Medicare Other

## 2014-06-07 ENCOUNTER — Telehealth: Payer: Self-pay | Admitting: *Deleted

## 2014-06-07 ENCOUNTER — Ambulatory Visit: Payer: Medicare Other | Admitting: Physical Therapy

## 2014-06-07 ENCOUNTER — Ambulatory Visit: Payer: Medicare Other | Attending: Internal Medicine | Admitting: Occupational Therapy

## 2014-06-07 DIAGNOSIS — R531 Weakness: Secondary | ICD-10-CM | POA: Diagnosis not present

## 2014-06-07 DIAGNOSIS — R258 Other abnormal involuntary movements: Secondary | ICD-10-CM | POA: Diagnosis not present

## 2014-06-07 DIAGNOSIS — R269 Unspecified abnormalities of gait and mobility: Secondary | ICD-10-CM | POA: Diagnosis not present

## 2014-06-07 DIAGNOSIS — R293 Abnormal posture: Secondary | ICD-10-CM

## 2014-06-07 DIAGNOSIS — R490 Dysphonia: Secondary | ICD-10-CM

## 2014-06-07 DIAGNOSIS — R29898 Other symptoms and signs involving the musculoskeletal system: Secondary | ICD-10-CM

## 2014-06-07 DIAGNOSIS — G20 Parkinson's disease: Secondary | ICD-10-CM

## 2014-06-07 DIAGNOSIS — R279 Unspecified lack of coordination: Secondary | ICD-10-CM | POA: Diagnosis present

## 2014-06-07 DIAGNOSIS — R278 Other lack of coordination: Secondary | ICD-10-CM

## 2014-06-07 NOTE — Therapy (Signed)
[facility] [address] [place], [place], [address] Phone: [phone]   Fax:  [phone]  Physical Therapy Treatment  Patient Details  Name: Victoria Lindsey MRN: [id number] Date of Birth: 1925/10/17 Referring Provider:  Elba Barman, MD  Encounter Date: 06/07/2014      PT End of Session - 06/07/14 1202    Visit Number 18   Number of Visits 24   Date for PT Re-Evaluation 07/04/14   PT Start Time 1103   PT Stop Time 1145   PT Time Calculation (min) 42 min   Equipment Utilized During Treatment Gait belt   Activity Tolerance Patient tolerated treatment well   Behavior During Therapy [facility] for tasks assessed/performed      Past Medical History  Diagnosis Date  . Hypertension   . Parkinson's disease   . Hyperlipidemia   . Dizziness   . Orthostatic hypotension   . Anemia   . Tremor   . Breast cancer     LEFT BREAST  . Kidney failure     stage 3  . Hypothyroidism   . Prediabetes   . TIA (transient ischemic attack)   . Hyperglycemia   . Left ventricular diastolic dysfunction   . Hypophonia     Past Surgical History  Procedure Laterality Date  . Cardiovascular stress test  06/2007    NORMAL  . Breast lumpectomy  2010  . Cholecystectomy  1994  . Tonsillectomy    . Nasal sinus surgery      submucous resection late 1950s  . Cataract extraction  2005,2006    Dr.Devonzo    There were no vitals filed for this visit.  Visit Diagnosis:  Posture abnormality  Abnormality of gait  Rigidity      Subjective Assessment - 06/07/14 1105    Symptoms Didn't have a good day yesterday due to taking antihistamine.  Pain in R hip is better.   Currently in Pain? Yes   Pain Score 2    Pain Location Hip   Pain Orientation Right   Pain Descriptors / Indicators Aching   Pain Type Chronic pain   Pain Onset More than a month ago   Pain Frequency Intermittent   Aggravating Factors  standing up or sitting down  a certain way   Pain Relieving Factors sitting down; ice massage has helped      Therapeutic Exercise:  Reviewed/pt performs lumbar hip hip flexibility exercises given last visit:  single knee to chest, trunk rotation, and piriformis stretch.  Supine pelvic tilts x 8 reps                [facility] Adult PT Treatment/Exercise - 06/07/14 1158    Transfers   Transfers Sit to Stand;Stand to Sit   Sit to Stand 5: Supervision;With upper extremity assist;With armrests;From chair/3-in-1   Sit to Stand Details (indicate cue type and reason) multiple reps through session   Stand to Sit 5: Supervision;With upper extremity assist;With armrests;To chair/3-in-1   High Level Balance   High Level Balance Activities Backward walking  in parallel bars; cues for incr. step height/length   High Level Balance Comments At 6" step:  forward and side step taps 2 sets x 10 reps with bilat UE support; in parallel bars:  single limb stance activities and compliant surface activities with progressively less UE support with min assist/min guard.  Hip/ankle strategy work on rockerboard, with pt requiring cues for independently decreasing posterior lean.  PT Short Term Goals - 05/06/14 1256    PT SHORT TERM GOAL #1   Title Pt will perform HEP with supervision for improved strength, balance, and gait. (Target date 05/03/14)   Status Not Met   PT SHORT TERM GOAL #2   Title Pt will improve Berg Balance score to at least 25/56 for decreased fall risk.   Status Achieved   PT SHORT TERM GOAL #3   Title Pt will improve TUG score to less than or equal to 30 seconds for improved ADL participation in home.   Status Achieved   PT SHORT TERM GOAL #4   Title Pt will perform at least 8 of 10 reps of sit<>stand with minimal UE support from <18 inch surfaces, for improved transfer efficiency and safety.   Status Not Met           PT Long Term Goals - 05/30/14 1028    PT LONG TERM GOAL #1    Title Pt will verbalize plans for continued community fitness upon D/C from PT   Time 4   Status New   PT LONG TERM GOAL #2   Title Pt will improve Berg Balance score to at least 37/56 for decreased fall risk.   Time 4   Period Weeks   Status New   PT LONG TERM GOAL #3   Title Pt will improve TUG score to less than or equal to 25 seconds for decreased fall risk.   Time 4   Period Weeks   Status New   PT LONG TERM GOAL #4   Title Pt will improve gait velocity to at least 2 ft/sec for improved gait efficiency and safety.   Time 4   Period Weeks   Status New               Plan - 06/07/14 1203    Clinical Impression Statement Pt reports using ice massage and doing stretching, not having as much hip pain; no pain reported during PT session.  In balance activities at parallel bars, pt appears to have decreased posterior lean and is doing some independent recovery from posterior weightshift standing position once cues are given.   Pt will benefit from skilled therapeutic intervention in order to improve on the following deficits Abnormal gait;Decreased activity tolerance;Decreased balance;Decreased mobility;Decreased strength;Difficulty walking;Postural dysfunction   Rehab Potential Good   PT Frequency 2x / week   PT Duration 4 weeks  4 additional weeks, this is wk 1 of 4   PT Treatment/Interventions ADLs/Self Care Home Management;Gait training;Stair training;Functional mobility training;Neuromuscular re-education;Balance training;Therapeutic exercise;Therapeutic activities;Patient/family education   PT Next Visit Plan Continue balance, gait, strengthening activities; single stance, compliant surface activities; standing hip strengthening   Consulted and Agree with Plan of Care Patient        Problem List Patient Active Problem List   Diagnosis Date Noted  . Encephalopathy, hypertensive 05/29/2014  . Dizziness of unknown cause 02/21/2014  . Left ventricular diastolic  dysfunction with preserved systolic function 84/05/7541  . Essential hypertension 12/30/2013  . Transaminitis 12/24/2013  . Palpitations 12/23/2013  . Respiratory insufficiency 12/22/2013  . Normocytic anemia 12/21/2013  . Thrombocytopenia 12/21/2013  . Abnormal ECG 12/15/2013  . Abnormal involuntary movement 12/15/2013  . Colorectal polyps 12/15/2013  . Chest pain 12/15/2013  . Arteriosclerosis of coronary artery 12/15/2013  . Chronic kidney disease (CKD), stage III (moderate) 12/15/2013  . Corn 12/15/2013  . Diabetes 12/15/2013  . Breathing difficult 12/15/2013  . Acid reflux 12/15/2013  .  HLD (hyperlipidemia) 12/15/2013  . BP (high blood pressure) 12/15/2013  . FOM (frequency of micturition) 12/15/2013  . Breast cancer, female 12/15/2013  . Fungal infection of nail 12/15/2013  . Osteopenia 12/15/2013  . Raynaud's syndrome 12/15/2013  . Restless leg 12/15/2013  . Gougerout-Sjoegren syndrome 12/15/2013  . Change in blood platelet count 12/15/2013  . Infection of urinary tract 12/15/2013  . Lower urinary tract infection 10/12/2013  . Idiopathic Parkinson's disease 09/06/2013  . Bladder spasm 09/05/2013  . Urgency of micturation 09/05/2013  . Dyspnea 06/15/2012  . Hyperglycemia 12/27/2011  . Sjogren's syndrome 12/17/2010  . TIA (transient ischemic attack) 12/17/2010  . Benign hypertensive heart disease without heart failure 09/01/2010  . Parkinson's disease 09/01/2010  . Hypothyroidism 09/01/2010  . Hypercholesterolemia 09/01/2010  . Chronic anemia 09/01/2010    Mycah Mcdougall W. 06/07/2014, 12:06 PM Mady Haagensen, PT 06/07/2014 12:07 PM Phone: [phone] Fax: [facility] [address] [place], [place], [address] Phone: [phone]   Fax:  [phone]

## 2014-06-07 NOTE — Therapy (Signed)
[facility] [address] [place], [place], [address] Phone: [phone]   Fax:  [phone]  Occupational Therapy Treatment  Patient Details  Name: Victoria Lindsey MRN: [id number] Date of Birth: 12-16-1925 Referring Provider:  Elba Barman, MD  Encounter Date: 06/07/2014      OT End of Session - 06/07/14 1309    Visit Number 18   Date for OT Re-Evaluation 07/27/14   Authorization Type Medicare   Authorization Time Period G code needed, renewed 05/28/14       G code next week!!!   Authorization - Visit Number 18   Authorization - Number of Visits 20   OT Start Time 1151   OT Stop Time 1229   OT Time Calculation (min) 38 min   Activity Tolerance Patient tolerated treatment well   Behavior During Therapy WFL for tasks assessed/performed      Past Medical History  Diagnosis Date  . Hypertension   . Parkinson's disease   . Hyperlipidemia   . Dizziness   . Orthostatic hypotension   . Anemia   . Tremor   . Breast cancer     LEFT BREAST  . Kidney failure     stage 3  . Hypothyroidism   . Prediabetes   . TIA (transient ischemic attack)   . Hyperglycemia   . Left ventricular diastolic dysfunction   . Hypophonia     Past Surgical History  Procedure Laterality Date  . Cardiovascular stress test  06/2007    NORMAL  . Breast lumpectomy  2010  . Cholecystectomy  1994  . Tonsillectomy    . Nasal sinus surgery      submucous resection late 1950s  . Cataract extraction  2005,2006    Dr.Devonzo    There were no vitals filed for this visit.  Visit Diagnosis:  Weakness  Decreased coordination  Posture abnormality  Rigidity  Bradykinesia      Subjective Assessment - 06/07/14 1151    Symptoms Pt reports she had a reaction to antihistamine yesterday   Currently in Pain? No/denies   Multiple Pain Sites No      Treatment: Simple cooking task to scramble an egg.  Therapist provided close  supervision and min v.c. For safety and incorporating large amplitude movements into retrieving  Items. Pt performed without LOB. Therapist requested pt attempt at home with her husband's assist. PWR! Exercises in seated 10 reps each. Armbike x 5 mins level 1 for conditioning. Standing for dynamic reaching with trunk rotation, min v.c. For large amplitude moements.                         OT Short Term Goals - 05/28/14 1113    OT SHORT TERM GOAL #1   Title I with inital HEP.   Baseline ck 05/03/14   Time 4   Period Weeks   Status Achieved   OT SHORT TERM GOAL #2   Title Pt / family will verbalize understanding of adapted strategies for ADLS/ IADLS.   Time 4   Period Weeks   Status Achieved   OT SHORT TERM GOAL #3   Title Pt will demonstrate improved indpendence with dressing as evidenced by decreasing PPT #4 to 45 secs or less.   Baseline 05/28/14-36.79 secs   Time 4   Period Weeks   Status Achieved  not met 04/29/14:  53.66sec   OT SHORT TERM GOAL #4   Title Pt will  demonstrate ability to write 4 sentences with 100 % legibility and minimal decrease in letter size.   Baseline 100% legibility and no significant decrease in size, 05/28/14   Time 4   Period Weeks   Status Achieved  Not met 04/29/14--95% legibility and min-mod decrease in size   OT SHORT TERM GOAL #5   Title Upgraded goal- Pt will demonstrate improved indpendence with dressing as evidenced by performing PPT #4 in 30 secs without LOB.   Time 4   Period Weeks   Status New   Additional Short Term Goals   Additional Short Term Goals Yes   OT SHORT TERM GOAL #6   Title Pt will perform simple stove top cooking task with close supervision.   Baseline due 06/27/14   Time 4   Period Weeks   Status New           OT Long Term Goals - 05/28/14 1113    OT LONG TERM GOAL #1   Title Pt will perfom dressing with supervision/set up.   Baseline ck 06/01/14   Time 8   Period Weeks   Status Achieved   OT  LONG TERM GOAL #2   Title Pt will perform light home management/ snack prep with min A.   Baseline pt makes her own sausage biscuit and clleans out sink.   Time 8   Period Weeks   Status Achieved   OT LONG TERM GOAL #3   Title Pt will demonstrate improved standing balance for ADLS as evidenced by increasing standing functional reach by 3 inches bilaterally.(see below for revised goal on 05/28/14)   Baseline 05/28/14-RUE 8.5, LUE 9.25- met for LUE , RUE ongoing, revised goal : increase RUE standing functional reach to 9.5 inches for increased standing balance for ADLs.   Time 8   Period Weeks   Status On-going   OT LONG TERM GOAL #4   Title Pt will demonstrate increased ease with feeding as evidenced by decreasing PPT #2 to 10 secs or less.   Baseline 12.91 secs- 05/28/14, not fully met, continue goal   Time 8   Period Weeks   Status On-going   OT LONG TERM GOAL #5   Title Pt/ family will verbalize understanding of compensatory strategies for cognition and ways to keep thinking skills sharp   Baseline handout issued 05/24/14   Time 8   Period Weeks   Status Achieved   Long Term Additional Goals   Additional Long Term Goals Yes   OT LONG TERM GOAL #6   Title Pt will perform basic cooking/ home management in standing at a modified independent level   Time 8   Period Weeks   Status New               Plan - 06/07/14 1310    Clinical Impression Statement Pt is progressing towards goals for cooking/ home management.   Pt will benefit from skilled therapeutic intervention in order to improve on the following deficits (Retired) Decreased cognition;Decreased knowledge of use of DME;Abnormal gait;Impaired flexibility;Pain;Decreased mobility;Decreased coordination;Decreased activity tolerance;Decreased endurance;Decreased range of motion;Decreased strength;Decreased balance;Decreased knowledge of precautions;Decreased safety awareness;Impaired perceived functional ability;Difficulty  walking;Impaired UE functional use   Plan large amplitude movments with ADLS/ functional activity   OT Home Exercise Plan  HEP issued-PWR seated, coordination, bag exercises (crumple, behind head, behind back, in fron pass)   Consulted and Agree with Plan of Care Patient;Family member/caregiver        Problem List Patient Active  Problem List   Diagnosis Date Noted  . Encephalopathy, hypertensive 05/29/2014  . Dizziness of unknown cause 02/21/2014  . Left ventricular diastolic dysfunction with preserved systolic function 07/37/1062  . Essential hypertension 12/30/2013  . Transaminitis 12/24/2013  . Palpitations 12/23/2013  . Respiratory insufficiency 12/22/2013  . Normocytic anemia 12/21/2013  . Thrombocytopenia 12/21/2013  . Abnormal ECG 12/15/2013  . Abnormal involuntary movement 12/15/2013  . Colorectal polyps 12/15/2013  . Chest pain 12/15/2013  . Arteriosclerosis of coronary artery 12/15/2013  . Chronic kidney disease (CKD), stage III (moderate) 12/15/2013  . Corn 12/15/2013  . Diabetes 12/15/2013  . Breathing difficult 12/15/2013  . Acid reflux 12/15/2013  . HLD (hyperlipidemia) 12/15/2013  . BP (high blood pressure) 12/15/2013  . FOM (frequency of micturition) 12/15/2013  . Breast cancer, female 12/15/2013  . Fungal infection of nail 12/15/2013  . Osteopenia 12/15/2013  . Raynaud's syndrome 12/15/2013  . Restless leg 12/15/2013  . Gougerout-Sjoegren syndrome 12/15/2013  . Change in blood platelet count 12/15/2013  . Infection of urinary tract 12/15/2013  . Lower urinary tract infection 10/12/2013  . Idiopathic Parkinson's disease 09/06/2013  . Bladder spasm 09/05/2013  . Urgency of micturation 09/05/2013  . Dyspnea 06/15/2012  . Hyperglycemia 12/27/2011  . Sjogren's syndrome 12/17/2010  . TIA (transient ischemic attack) 12/17/2010  . Benign hypertensive heart disease without heart failure 09/01/2010  . Parkinson's disease 09/01/2010  . Hypothyroidism  09/01/2010  . Hypercholesterolemia 09/01/2010  . Chronic anemia 09/01/2010    RINE,KATHRYN 06/07/2014, 1:13 PM Theone Murdoch, OTR/L [phone] Phone: [phone] 1:13 PM 06/07/2014 [facility] [address] [place], [place], [address] Phone: [phone]   Fax:  [phone]

## 2014-06-07 NOTE — Telephone Encounter (Signed)
Patient daughter called and stated that patient has been having increasing chest pains for over the last 2 days under Left Breast. Hurting now to take a deep breath. Instructed daughter to take patient to the urgent care to be evaluated. She agreed.

## 2014-06-07 NOTE — Therapy (Signed)
[facility] [address] [place], [place], [address] Phone: [phone]   Fax:  [phone]  Speech Language Pathology Treatment  Patient Details  Name: Victoria Lindsey MRN: [id number] Date of Birth: [birth year] Referring Provider:  [name], DO  Encounter Date: 06/07/2014      End of Session - 06/07/14 1102    Visit Number 10   Number of Visits 16   Date for SLP Re-Evaluation 07/07/14   SLP Start Time 64   SLP Stop Time  1059   SLP Time Calculation (min) 40 min   Activity Tolerance Other (comment)  limited by respiratory issue d/t SE of meds (antihistamine)      Past Medical History  Diagnosis Date  . Hypertension   . Parkinson's disease   . Hyperlipidemia   . Dizziness   . Orthostatic hypotension   . Anemia   . Tremor   . Breast cancer     LEFT BREAST  . Kidney failure     stage 3  . Hypothyroidism   . Prediabetes   . TIA (transient ischemic attack)   . Hyperglycemia   . Left ventricular diastolic dysfunction   . Hypophonia     Past Surgical History  Procedure Laterality Date  . Cardiovascular stress test  06/2007    NORMAL  . Breast lumpectomy  2010  . Cholecystectomy  1994  . Tonsillectomy    . Nasal sinus surgery      submucous resection late 1950s  . Cataract extraction  2005,2006    Dr.Devonzo    There were no vitals filed for this visit.  Visit Diagnosis: Hypokinetic Parkinsonian dysphonia      Subjective Assessment - 06/07/14 1026    Symptoms Pt had allergic reaction to antihistamine yesterday adn felt dyspnic all day (24 hour dose). Pt appears dyspnic today as well but pt reports not as bad as yesterday.               ADULT SLP TREATMENT - 06/07/14 1028    General Information   Behavior/Cognition Alert;Cooperative;Pleasant mood   Treatment Provided   Treatment provided Cognitive-Linquistic   Pain Assessment   Pain Assessment 0-10   Pain Score 2    Pain  Location mid chest, below heart   Pain Descriptors / Indicators Pressure   Pain Intervention(s) Limited activity within patient's tolerance   Cognitive-Linquistic Treatment   Treatment focused on Dysarthria   Skilled Treatment loud /a/ with average 78dB, hoarseness, and 3-4 seconds in duration, which is approximately half of pt's regular length, and approx 3-4 dB lower than last measured volume. Structured tasks today focusing on louder speech resulted in pt requiring mod A consistently for volume. Pt was consistently demonstrating reduced breath support in short sentence/phrase tasks and was unable to obtain improved support in answers >3 words, despite SLP consistent cues   Assessment / Recommendations / Plan   Plan Continue with current plan of care   Progression Toward Goals   Progression toward goals Not progressing toward goals (comment)  likely due to respiratory issues yesterday/today d/t med SE            SLP Short Term Goals - 06/07/14 1103    SLP SHORT TERM GOAL #1   Title pt will maintain average of loud /a/ at 80dB for 5 sessions   Status Not Met  cues needed   SLP SHORT TERM GOAL #2   Title pt will produce 16/20 sentences with average  70dB   Status Not Met   SLP SHORT TERM GOAL #3   Title pt will improve loudness in 5 minutes simple conversation to 70dB with rare min A   Status Not Met          SLP Long Term Goals - 04-Jul-2014 1103    SLP LONG TERM GOAL #1   Title pt will maintain average loudness of 80dB with loud /a/ over 7 sessions with rare min A   Time 4   Period Weeks   Status Revised   SLP LONG TERM GOAL #2   Title pt will improve loudness to 70dB in 5 minutes simple/mod complex conversation with rare min A   Time 4   Period Weeks   Status Revised   SLP LONG TERM GOAL #3   Title pt will report feeling abdominal movement necessary for louder speech in simple conversation with occasional min A   Time 4   Period Weeks   Status Revised           Plan - 07-04-14 1103    Clinical Impression Statement Limited progress today due to SE from antihistamines yesterday.   Speech Therapy Frequency 2x / week   Duration 4 weeks   Treatment/Interventions Internal/external aids;Functional tasks;Cueing hierarchy;Patient/family education;Compensatory strategies;SLP instruction and feedback   Potential to Achieve Goals Good   Potential Considerations Ability to learn/carryover information;Severity of impairments          G-Codes - 07-04-2014 1104    Functional Assessment Tool Used noms   Functional Limitations Motor speech   Motor Speech Current Status [phone]) At least 20 percent but less than 40 percent impaired, limited or restricted   Motor Speech Goal Status ([id number]) At least 1 percent but less than 20 percent impaired, limited or restricted      Problem List Patient Active Problem List   Diagnosis Date Noted  . Encephalopathy, hypertensive 05/29/2014  . Dizziness of unknown cause 02/21/2014  . Left ventricular diastolic dysfunction with preserved systolic function 41/93/7902  . Essential hypertension 12/30/2013  . Transaminitis 12/24/2013  . Palpitations 12/23/2013  . Respiratory insufficiency 12/22/2013  . Normocytic anemia 12/21/2013  . Thrombocytopenia 12/21/2013  . Abnormal ECG 12/15/2013  . Abnormal involuntary movement 12/15/2013  . Colorectal polyps 12/15/2013  . Chest pain 12/15/2013  . Arteriosclerosis of coronary artery 12/15/2013  . Chronic kidney disease (CKD), stage III (moderate) 12/15/2013  . Corn 12/15/2013  . Diabetes 12/15/2013  . Breathing difficult 12/15/2013  . Acid reflux 12/15/2013  . HLD (hyperlipidemia) 12/15/2013  . BP (high blood pressure) 12/15/2013  . FOM (frequency of micturition) 12/15/2013  . Breast cancer, female 12/15/2013  . Fungal infection of nail 12/15/2013  . Osteopenia 12/15/2013  . Raynaud's syndrome 12/15/2013  . Restless leg 12/15/2013  . Gougerout-Sjoegren syndrome 12/15/2013   . Change in blood platelet count 12/15/2013  . Infection of urinary tract 12/15/2013  . Lower urinary tract infection 10/12/2013  . Idiopathic Parkinson's disease 09/06/2013  . Bladder spasm 09/05/2013  . Urgency of micturation 09/05/2013  . Dyspnea 06/15/2012  . Hyperglycemia 12/27/2011  . Sjogren's syndrome 12/17/2010  . TIA (transient ischemic attack) 12/17/2010  . Benign hypertensive heart disease without heart failure 09/01/2010  . Parkinson's disease 09/01/2010  . Hypothyroidism 09/01/2010  . Hypercholesterolemia 09/01/2010  . Chronic anemia 09/01/2010    SCHINKE,CARL,SLP 04-Jul-2014, 11:04 AM  [facility] [address] [place], [place], [address] Phone: [phone]   Fax:  [phone]

## 2014-06-07 NOTE — Patient Instructions (Signed)
  Please complete the assigned speech therapy homework and return it to your next session.  

## 2014-06-11 ENCOUNTER — Ambulatory Visit: Payer: Medicare Other | Admitting: Physical Therapy

## 2014-06-11 ENCOUNTER — Ambulatory Visit: Payer: Medicare Other | Admitting: Occupational Therapy

## 2014-06-11 ENCOUNTER — Other Ambulatory Visit: Payer: Self-pay | Admitting: Internal Medicine

## 2014-06-11 ENCOUNTER — Ambulatory Visit: Payer: Medicare Other

## 2014-06-11 DIAGNOSIS — R258 Other abnormal involuntary movements: Secondary | ICD-10-CM

## 2014-06-11 DIAGNOSIS — R278 Other lack of coordination: Secondary | ICD-10-CM

## 2014-06-11 DIAGNOSIS — R531 Weakness: Secondary | ICD-10-CM

## 2014-06-11 DIAGNOSIS — R279 Unspecified lack of coordination: Secondary | ICD-10-CM | POA: Diagnosis not present

## 2014-06-11 DIAGNOSIS — R293 Abnormal posture: Secondary | ICD-10-CM

## 2014-06-11 DIAGNOSIS — R490 Dysphonia: Secondary | ICD-10-CM

## 2014-06-11 DIAGNOSIS — G20 Parkinson's disease: Secondary | ICD-10-CM

## 2014-06-11 DIAGNOSIS — R29898 Other symptoms and signs involving the musculoskeletal system: Secondary | ICD-10-CM

## 2014-06-11 NOTE — Therapy (Signed)
[facility] [address] [place], [place], [address] Phone: [phone]   Fax:  [phone]  Speech Language Pathology Treatment  Patient Details  Name: Victoria Lindsey MRN: [id number] Date of Birth: 1925/12/23 Referring Provider:  [name], DO  Encounter Date: 06/11/2014      End of Session - 06/11/14 1141    Visit Number 11   Number of Visits 16   Date for SLP Re-Evaluation 07/07/14   SLP Start Time 1105   SLP Stop Time  1145   SLP Time Calculation (min) 40 min   Activity Tolerance Patient tolerated treatment well;Patient limited by fatigue      Past Medical History  Diagnosis Date  . Hypertension   . Parkinson's disease   . Hyperlipidemia   . Dizziness   . Orthostatic hypotension   . Anemia   . Tremor   . Breast cancer     LEFT BREAST  . Kidney failure     stage 3  . Hypothyroidism   . Prediabetes   . TIA (transient ischemic attack)   . Hyperglycemia   . Left ventricular diastolic dysfunction   . Hypophonia     Past Surgical History  Procedure Laterality Date  . Cardiovascular stress test  06/2007    NORMAL  . Breast lumpectomy  2010  . Cholecystectomy  1994  . Tonsillectomy    . Nasal sinus surgery      submucous resection late 1950s  . Cataract extraction  2005,2006    Dr.Devonzo    There were no vitals filed for this visit.  Visit Diagnosis: Hypokinetic Parkinsonian dysphonia      Subjective Assessment - 06/11/14 1120    Subjective "I'm doing better every day." Pt appears to still be mildly dyspnic. Husband reports pt with some incr'd loudness at home, with cues still req'd.               ADULT SLP TREATMENT - 06/11/14 1117    General Information   Behavior/Cognition Alert;Cooperative;Pleasant mood   Treatment Provided   Treatment provided Cognitive-Linquistic   Pain Assessment   Pain Assessment 0-10   Pain Score 4    Pain Location back   Pain Descriptors /  Indicators Nagging   Pain Intervention(s) Monitored during session   Cognitive-Linquistic Treatment   Treatment focused on Dysarthria   Skilled Treatment loud /a/ with average 83dB independently. Sentence (pic description) with usual min cues for breathing, loudness above 70dB. Questions about cards responded to by pt with average 68dB and consistent cues for breathing. Simple conversation between tasks with average 67dB without cues, and noted dyspnea with speech. Pt with notable fatigue after approx 30 minutes of session.   Assessment / Recommendations / Plan   Plan Continue with current plan of care   Progression Toward Goals   Progression toward goals Progressing toward goals            SLP Short Term Goals - 06/07/14 1103    SLP SHORT TERM GOAL #1   Title pt will maintain average of loud /a/ at 80dB for 5 sessions   Status Not Met  cues needed   SLP SHORT TERM GOAL #2   Title pt will produce 16/20 sentences with average 70dB   Status Not Met   SLP SHORT TERM GOAL #3   Title pt will improve loudness in 5 minutes simple conversation to 70dB with rare min A   Status Not Met  SLP Long Term Goals - 06/11/14 1143    SLP LONG TERM GOAL #1   Title pt will maintain average loudness of 80dB with loud /a/ over 7 sessions with rare min A   Time 3   Period Weeks   Status Revised   SLP LONG TERM GOAL #2   Title pt will improve loudness to 70dB in 5 minutes simple/mod complex conversation with rare min A   Time 3   Period Weeks   Status Revised   SLP LONG TERM GOAL #3   Title pt will report feeling abdominal movement necessary for louder speech in simple conversation with occasional min A   Time 3   Period Weeks   Status Revised          Plan - 06/11/14 1142    Clinical Impression Statement Pt cont to require min-mod A for breath support for speech, min A rarely for loudness. Notable fatigue today after approx 30 minutes.   Speech Therapy Frequency 2x / week    Duration --  3 weeks   Treatment/Interventions Internal/external aids;Functional tasks;Cueing hierarchy;Patient/family education;Compensatory strategies;SLP instruction and feedback   Potential to Achieve Goals Good   Potential Considerations Ability to learn/carryover information;Severity of impairments        Problem List Patient Active Problem List   Diagnosis Date Noted  . Encephalopathy, hypertensive 05/29/2014  . Dizziness of unknown cause 02/21/2014  . Left ventricular diastolic dysfunction with preserved systolic function 20/25/4270  . Essential hypertension 12/30/2013  . Transaminitis 12/24/2013  . Palpitations 12/23/2013  . Respiratory insufficiency 12/22/2013  . Normocytic anemia 12/21/2013  . Thrombocytopenia 12/21/2013  . Abnormal ECG 12/15/2013  . Abnormal involuntary movement 12/15/2013  . Colorectal polyps 12/15/2013  . Chest pain 12/15/2013  . Arteriosclerosis of coronary artery 12/15/2013  . Chronic kidney disease (CKD), stage III (moderate) 12/15/2013  . Corn 12/15/2013  . Diabetes 12/15/2013  . Breathing difficult 12/15/2013  . Acid reflux 12/15/2013  . HLD (hyperlipidemia) 12/15/2013  . BP (high blood pressure) 12/15/2013  . FOM (frequency of micturition) 12/15/2013  . Breast cancer, female 12/15/2013  . Fungal infection of nail 12/15/2013  . Osteopenia 12/15/2013  . Raynaud's syndrome 12/15/2013  . Restless leg 12/15/2013  . Gougerout-Sjoegren syndrome 12/15/2013  . Change in blood platelet count 12/15/2013  . Infection of urinary tract 12/15/2013  . Lower urinary tract infection 10/12/2013  . Idiopathic Parkinson's disease 09/06/2013  . Bladder spasm 09/05/2013  . Urgency of micturation 09/05/2013  . Dyspnea 06/15/2012  . Hyperglycemia 12/27/2011  . Sjogren's syndrome 12/17/2010  . TIA (transient ischemic attack) 12/17/2010  . Benign hypertensive heart disease without heart failure 09/01/2010  . Parkinson's disease 09/01/2010  .  Hypothyroidism 09/01/2010  . Hypercholesterolemia 09/01/2010  . Chronic anemia 09/01/2010    Garald Balding, SLP 06/11/2014, 11:44 AM  [facility] [address] [place], [place], [address] Phone: [phone]   Fax:  [phone]

## 2014-06-11 NOTE — Patient Instructions (Signed)
Provided patient with LSVT based picture of rock and reach forward/back position, with UEs supported at counter, pt facing counter, 10 reps each position.  Provided patient with PWR!-based picture of widened base of support in standing with lateral rocking and shift/lift of lower extremity, 10 reps, 2 sets.  Recommended to perform both with UE support at counter, 1-2 times per day.

## 2014-06-11 NOTE — Therapy (Signed)
[facility] [address] [place], [place], [address] Phone: [phone]   Fax:  [phone]  Physical Therapy Treatment  Patient Details  Name: Victoria Lindsey MRN: [id number] Date of Birth: Mar 21, 1925 Referring Provider:  [name], DO  Encounter Date: 06/11/2014      PT End of Session - 06/11/14 1339    Visit Number 19   Number of Visits 24   Date for PT Re-Evaluation 07/04/14   PT Start Time 1236   PT Stop Time 1316   PT Time Calculation (min) 40 min   Equipment Utilized During Treatment Gait belt   Activity Tolerance Patient tolerated treatment well   Behavior During Therapy [facility] for tasks assessed/performed      Past Medical History  Diagnosis Date  . Hypertension   . Parkinson's disease   . Hyperlipidemia   . Dizziness   . Orthostatic hypotension   . Anemia   . Tremor   . Breast cancer     LEFT BREAST  . Kidney failure     stage 3  . Hypothyroidism   . Prediabetes   . TIA (transient ischemic attack)   . Hyperglycemia   . Left ventricular diastolic dysfunction   . Hypophonia     Past Surgical History  Procedure Laterality Date  . Cardiovascular stress test  06/2007    NORMAL  . Breast lumpectomy  2010  . Cholecystectomy  1994  . Tonsillectomy    . Nasal sinus surgery      submucous resection late 1950s  . Cataract extraction  2005,2006    Dr.Devonzo    There were no vitals filed for this visit.  Visit Diagnosis:  Bradykinesia  Rigidity  Posture abnormality      Subjective Assessment - 06/11/14 1236    Subjective Had a few stumbles over the weekend-in the closet where I can't get my walker in there.   Currently in Pain? No/denies         Neuro Reeducation:  At counter, worked most of session on varied positions of weightshifting to improve ease of standing and reaching into areas at home, such as Catering manager.  Pt holds to counter with bilateral UE support, with  therapist min guard assistance- lateral weightshifting x 10 reps 3 sets, progressing to weightshift with lower extremity lifts, and then weightshift with reaching.  Pt standing with semi-tandem (staggered) stance with UE suppoer and min guard assistance with forward/back weightshifting 10 reps x 3 sets, with progression to weightshift with lower extremity lifts.  (Added the above exercises to HEP).  Practicing quarter turns to R and to L, from counter to rollator, 5 reps, with cues for large steps for improved turns.  Single limb standing activities at counter, with hip abduction x 10 reps, hip extension 10 reps, then hamstring curls x 10 reps, then marching x 10 reps, with cues to increase hold of lower extremity in the air, for improved single limb stance.  There Ex:  Sit<>stand x 5 reps during session with UE support.  Nu Step, Level 3, 4 extremities x 6 minutes with cues for full "step" length and increased intensity >60 steps per minute.  Standing heel/toe raises x 10 reps at rollator for ankle strengthening.                      PT Education - 06/11/14 1338    Education provided Yes   Education Details HEP-lateral and stagger stance  forward/back weightshifting at [employer]) Educated Patient;Spouse   Methods Demonstration;Explanation;Handout   Comprehension Verbalized understanding;Returned demonstration          PT Short Term Goals - 05/06/14 1256    PT SHORT TERM GOAL #1   Title Pt will perform HEP with supervision for improved strength, balance, and gait. (Target date 05/03/14)   Status Not Met   PT SHORT TERM GOAL #2   Title Pt will improve Berg Balance score to at least 25/56 for decreased fall risk.   Status Achieved   PT SHORT TERM GOAL #3   Title Pt will improve TUG score to less than or equal to 30 seconds for improved ADL participation in home.   Status Achieved   PT SHORT TERM GOAL #4   Title Pt will perform at least 8 of 10 reps of sit<>stand  with minimal UE support from <18 inch surfaces, for improved transfer efficiency and safety.   Status Not Met           PT Long Term Goals - 05/30/14 1028    PT LONG TERM GOAL #1   Title Pt will verbalize plans for continued community fitness upon D/C from PT   Time 4   Status New   PT LONG TERM GOAL #2   Title Pt will improve Berg Balance score to at least 37/56 for decreased fall risk.   Time 4   Period Weeks   Status New   PT LONG TERM GOAL #3   Title Pt will improve TUG score to less than or equal to 25 seconds for decreased fall risk.   Time 4   Period Weeks   Status New   PT LONG TERM GOAL #4   Title Pt will improve gait velocity to at least 2 ft/sec for improved gait efficiency and safety.   Time 4   Period Weeks   Status New               Plan - 06/11/14 1339    Clinical Impression Statement Pt performs standing weightshifting activities at counter to replicate standing and reaching activities within home, with patient improving weightshifting activities as patient has widened BOS.  Pt will continue to benefit from further skilled PT to address balance, functional stretnghtening and gait.   Pt will benefit from skilled therapeutic intervention in order to improve on the following deficits Abnormal gait;Decreased activity tolerance;Decreased balance;Decreased mobility;Decreased strength;Difficulty walking;Postural dysfunction   Rehab Potential Good   PT Frequency 2x / week   PT Duration 4 weeks  this is wk 2 of 4   PT Treatment/Interventions ADLs/Self Care Home Management;Gait training;Stair training;Functional mobility training;Neuromuscular re-education;Balance training;Therapeutic exercise;Therapeutic activities;Patient/family education   PT Next Visit Plan Continue balance, gait, strengthening activities; single stance, compliant surface activities; standing hip strengthening; Review HEP additions, continue turning practice   Consulted and Agree with Plan of  Care Patient;Family member/caregiver   Family Member Consulted husband     PT WILL NEED G-CODE NEXT VISIT   Problem List Patient Active Problem List   Diagnosis Date Noted  . Encephalopathy, hypertensive 05/29/2014  . Dizziness of unknown cause 02/21/2014  . Left ventricular diastolic dysfunction with preserved systolic function 46/96/2952  . Essential hypertension 12/30/2013  . Transaminitis 12/24/2013  . Palpitations 12/23/2013  . Respiratory insufficiency 12/22/2013  . Normocytic anemia 12/21/2013  . Thrombocytopenia 12/21/2013  . Abnormal ECG 12/15/2013  . Abnormal involuntary movement 12/15/2013  . Colorectal polyps 12/15/2013  . Chest pain 12/15/2013  .  Arteriosclerosis of coronary artery 12/15/2013  . Chronic kidney disease (CKD), stage III (moderate) 12/15/2013  . Corn 12/15/2013  . Diabetes 12/15/2013  . Breathing difficult 12/15/2013  . Acid reflux 12/15/2013  . HLD (hyperlipidemia) 12/15/2013  . BP (high blood pressure) 12/15/2013  . FOM (frequency of micturition) 12/15/2013  . Breast cancer, female 12/15/2013  . Fungal infection of nail 12/15/2013  . Osteopenia 12/15/2013  . Raynaud's syndrome 12/15/2013  . Restless leg 12/15/2013  . Gougerout-Sjoegren syndrome 12/15/2013  . Change in blood platelet count 12/15/2013  . Infection of urinary tract 12/15/2013  . Lower urinary tract infection 10/12/2013  . Idiopathic Parkinson's disease 09/06/2013  . Bladder spasm 09/05/2013  . Urgency of micturation 09/05/2013  . Dyspnea 06/15/2012  . Hyperglycemia 12/27/2011  . Sjogren's syndrome 12/17/2010  . TIA (transient ischemic attack) 12/17/2010  . Benign hypertensive heart disease without heart failure 09/01/2010  . Parkinson's disease 09/01/2010  . Hypothyroidism 09/01/2010  . Hypercholesterolemia 09/01/2010  . Chronic anemia 09/01/2010    MARRIOTT,AMY W. 06/11/2014, 1:42 PM  Mady Haagensen, PT 06/11/2014 1:43 PM Phone: [phone] Fax:  [facility] [address] [place], [place], [address] Phone: [phone]   Fax:  [phone]

## 2014-06-11 NOTE — Therapy (Signed)
[facility] [address] [place], [place], [address] Phone: [phone]   Fax:  [phone]  Occupational Therapy Treatment  Patient Details  Name: Victoria Lindsey MRN: [id number] Date of Birth: Mar 23, 1925 Referring Provider:  [name], DO  Encounter Date: 06/11/2014      OT End of Session - 06/11/14 1152    Visit Number 19   Number of Visits 31   Date for OT Re-Evaluation 07/27/14   Authorization Type Medicare   Authorization Time Period G code needed, renewed 05/28/14       G code next week!!!   Authorization - Visit Number 19   Authorization - Number of Visits 20   OT Start Time 1154   OT Stop Time 1232   OT Time Calculation (min) 38 min   Activity Tolerance Patient tolerated treatment well   Behavior During Therapy WFL for tasks assessed/performed      Past Medical History  Diagnosis Date  . Hypertension   . Parkinson's disease   . Hyperlipidemia   . Dizziness   . Orthostatic hypotension   . Anemia   . Tremor   . Breast cancer     LEFT BREAST  . Kidney failure     stage 3  . Hypothyroidism   . Prediabetes   . TIA (transient ischemic attack)   . Hyperglycemia   . Left ventricular diastolic dysfunction   . Hypophonia     Past Surgical History  Procedure Laterality Date  . Cardiovascular stress test  06/2007    NORMAL  . Breast lumpectomy  2010  . Cholecystectomy  1994  . Tonsillectomy    . Nasal sinus surgery      submucous resection late 1950s  . Cataract extraction  2005,2006    Dr.Devonzo    There were no vitals filed for this visit.  Visit Diagnosis:  Bradykinesia  Rigidity  Weakness  Decreased coordination  Posture abnormality                  OT Treatments/Exercises (OP) - 06/11/14 0001    ADLs   Home Maintenance Cleaning table with each UE and folding towels in standing with min-mod cueing initially for big movements.  Recommended ways to increase  involvement in IADLs at home and use of big movements (reaching in closet/cabinet with walker/counter to hold onto, stirring, folding laundry, wiping down counters, etc.).  Pt/husband verbalized understanding.   Neurological Re-education Exercises   Reciprocal Movements UBE x 5 mins level 1 for conditioning, min v.c. for speed (pt maintained 25-30rpms)   Functional Reaching Activities   High Level In standing, functional reaching to remove/place clothespins with 1-8lb resistance on vertical pole with ea. UE for increased activity tolerance.  Then in sitting, functional reaching to place small pegs in vertical pegboard to copy design with increased time for increased coordination, set-up for big movements, and cognitive component.                  OT Short Term Goals - 05/28/14 1113    OT SHORT TERM GOAL #1   Title I with inital HEP.   Baseline ck 05/03/14   Time 4   Period Weeks   Status Achieved   OT SHORT TERM GOAL #2   Title Pt / family will verbalize understanding of adapted strategies for ADLS/ IADLS.   Time 4   Period Weeks   Status Achieved   OT SHORT TERM GOAL #3  Title Pt will demonstrate improved indpendence with dressing as evidenced by decreasing PPT #4 to 45 secs or less.   Baseline 05/28/14-36.79 secs   Time 4   Period Weeks   Status Achieved  not met 04/29/14:  53.66sec   OT SHORT TERM GOAL #4   Title Pt will demonstrate ability to write 4 sentences with 100 % legibility and minimal decrease in letter size.   Baseline 100% legibility and no significant decrease in size, 05/28/14   Time 4   Period Weeks   Status Achieved  Not met 04/29/14--95% legibility and min-mod decrease in size   OT SHORT TERM GOAL #5   Title Upgraded goal- Pt will demonstrate improved indpendence with dressing as evidenced by performing PPT #4 in 30 secs without LOB.   Time 4   Period Weeks   Status New   Additional Short Term Goals   Additional Short Term Goals Yes   OT SHORT TERM  GOAL #6   Title Pt will perform simple stove top cooking task with close supervision.   Baseline due 06/27/14   Time 4   Period Weeks   Status New           OT Long Term Goals - 05/28/14 1113    OT LONG TERM GOAL #1   Title Pt will perfom dressing with supervision/set up.   Baseline ck 06/01/14   Time 8   Period Weeks   Status Achieved   OT LONG TERM GOAL #2   Title Pt will perform light home management/ snack prep with min A.   Baseline pt makes her own sausage biscuit and clleans out sink.   Time 8   Period Weeks   Status Achieved   OT LONG TERM GOAL #3   Title Pt will demonstrate improved standing balance for ADLS as evidenced by increasing standing functional reach by 3 inches bilaterally.(see below for revised goal on 05/28/14)   Baseline 05/28/14-RUE 8.5, LUE 9.25- met for LUE , RUE ongoing, revised goal : increase RUE standing functional reach to 9.5 inches for increased standing balance for ADLs.   Time 8   Period Weeks   Status On-going   OT LONG TERM GOAL #4   Title Pt will demonstrate increased ease with feeding as evidenced by decreasing PPT #2 to 10 secs or less.   Baseline 12.91 secs- 05/28/14, not fully met, continue goal   Time 8   Period Weeks   Status On-going   OT LONG TERM GOAL #5   Title Pt/ family will verbalize understanding of compensatory strategies for cognition and ways to keep thinking skills sharp   Baseline handout issued 05/24/14   Time 8   Period Weeks   Status Achieved   Long Term Additional Goals   Additional Long Term Goals Yes   OT LONG TERM GOAL #6   Title Pt will perform basic cooking/ home management in standing at a modified independent level   Time 8   Period Weeks   Status New               Plan - 06/11/14 1153    Clinical Impression Statement Pt is able to perform simple home management tasks in clinic, and encouraged pt to perform at home with supervision.   Pt will benefit from skilled therapeutic intervention in  order to improve on the following deficits (Retired) Decreased cognition;Decreased knowledge of use of DME;Abnormal gait;Impaired flexibility;Pain;Decreased mobility;Decreased coordination;Decreased activity tolerance;Decreased endurance;Decreased range of motion;Decreased strength;Decreased balance;Decreased  knowledge of precautions;Decreased safety awareness;Impaired perceived functional ability;Difficulty walking;Impaired UE functional use   Plan G-code next session, large amplitude movements with functional activities/ADLs   OT Home Exercise Plan  HEP issued-PWR seated, coordination, bag exercises (crumple, behind head, behind back, in fron pass)   Consulted and Agree with Plan of Care Patient;Family member/caregiver   Family Member Consulted (husband present)        Problem List Patient Active Problem List   Diagnosis Date Noted  . Encephalopathy, hypertensive 05/29/2014  . Dizziness of unknown cause 02/21/2014  . Left ventricular diastolic dysfunction with preserved systolic function 48/27/0786  . Essential hypertension 12/30/2013  . Transaminitis 12/24/2013  . Palpitations 12/23/2013  . Respiratory insufficiency 12/22/2013  . Normocytic anemia 12/21/2013  . Thrombocytopenia 12/21/2013  . Abnormal ECG 12/15/2013  . Abnormal involuntary movement 12/15/2013  . Colorectal polyps 12/15/2013  . Chest pain 12/15/2013  . Arteriosclerosis of coronary artery 12/15/2013  . Chronic kidney disease (CKD), stage III (moderate) 12/15/2013  . Corn 12/15/2013  . Diabetes 12/15/2013  . Breathing difficult 12/15/2013  . Acid reflux 12/15/2013  . HLD (hyperlipidemia) 12/15/2013  . BP (high blood pressure) 12/15/2013  . FOM (frequency of micturition) 12/15/2013  . Breast cancer, female 12/15/2013  . Fungal infection of nail 12/15/2013  . Osteopenia 12/15/2013  . Raynaud's syndrome 12/15/2013  . Restless leg 12/15/2013  . Gougerout-Sjoegren syndrome 12/15/2013  . Change in blood platelet  count 12/15/2013  . Infection of urinary tract 12/15/2013  . Lower urinary tract infection 10/12/2013  . Idiopathic Parkinson's disease 09/06/2013  . Bladder spasm 09/05/2013  . Urgency of micturation 09/05/2013  . Dyspnea 06/15/2012  . Hyperglycemia 12/27/2011  . Sjogren's syndrome 12/17/2010  . TIA (transient ischemic attack) 12/17/2010  . Benign hypertensive heart disease without heart failure 09/01/2010  . Parkinson's disease 09/01/2010  . Hypothyroidism 09/01/2010  . Hypercholesterolemia 09/01/2010  . Chronic anemia 09/01/2010    [facility] 06/11/2014, 12:55 PM  [facility] [address] [place], [place], [address] Phone: [phone]   Fax:  [place], OTR/L 06/11/2014 12:55 PM

## 2014-06-14 ENCOUNTER — Ambulatory Visit: Payer: Medicare Other | Admitting: Occupational Therapy

## 2014-06-14 ENCOUNTER — Ambulatory Visit: Payer: Medicare Other | Admitting: Physical Therapy

## 2014-06-14 ENCOUNTER — Ambulatory Visit: Payer: Medicare Other

## 2014-06-14 DIAGNOSIS — R258 Other abnormal involuntary movements: Secondary | ICD-10-CM

## 2014-06-14 DIAGNOSIS — R293 Abnormal posture: Secondary | ICD-10-CM

## 2014-06-14 DIAGNOSIS — G20 Parkinson's disease: Secondary | ICD-10-CM

## 2014-06-14 DIAGNOSIS — R29898 Other symptoms and signs involving the musculoskeletal system: Secondary | ICD-10-CM

## 2014-06-14 DIAGNOSIS — R278 Other lack of coordination: Secondary | ICD-10-CM

## 2014-06-14 DIAGNOSIS — R279 Unspecified lack of coordination: Secondary | ICD-10-CM | POA: Diagnosis not present

## 2014-06-14 DIAGNOSIS — R490 Dysphonia: Secondary | ICD-10-CM

## 2014-06-14 DIAGNOSIS — R531 Weakness: Secondary | ICD-10-CM

## 2014-06-14 NOTE — Patient Instructions (Signed)
Sleeping on Back   Place pillow under knees. A pillow with cervical support and a roll around waist are also helpful.   Copyright  VHI. All rights reserved.  Work Positioning   Position self close to work, whether standing or sitting. Avoid straining forward at neck or waist.   Copyright  VHI. All rights reserved.  Posture - Sitting   Sit upright, head facing forward. Try using a roll to support lower back. Keep shoulders relaxed, and avoid rounded back. Keep hips level with knees. Avoid crossing legs for long periods.   Copyright  VHI. All rights reserved.  Brushing Teeth    Place one foot on ledge and one hand on counter. Bend other knee slightly to keep back straight.  Copyright  VHI. All rights reserved.  Housework - Sink   Place one foot on ledge of cabinet under sink when standing at sink for prolonged periods.   Copyright  VHI. All rights reserved.  Lifting Principles .Maintain proper posture and head alignment. .Slide object as close as possible before lifting. .Move obstacles out of the way. .Test before lifting; ask for help if too heavy. .Tighten stomach muscles without holding breath. .Use smooth movements; do not jerk. .Use legs to do the work, and pivot with feet. .Distribute the work load symmetrically and close to the center of trunk. .Push instead of pull whenever possible.  Copyright  VHI. All rights reserved.

## 2014-06-14 NOTE — Therapy (Signed)
[facility] [address] [place], [place], [address] Phone: [phone]   Fax:  [phone]  Physical Therapy Treatment  Patient Details  Name: Victoria Lindsey MRN: [id number] Date of Birth: [birth year] Referring Provider:  [name], DO  Encounter Date: 06/14/2014      PT End of Session - 06/14/14 1221    Visit Number 20   Number of Visits 24   Date for PT Re-Evaluation 07/04/14   PT Start Time 1018   PT Stop Time 1103   PT Time Calculation (min) 45 min   Activity Tolerance Patient tolerated treatment well   Behavior During Therapy [facility] for tasks assessed/performed      Past Medical History  Diagnosis Date  . Hypertension   . Parkinson's disease   . Hyperlipidemia   . Dizziness   . Orthostatic hypotension   . Anemia   . Tremor   . Breast cancer     LEFT BREAST  . Kidney failure     stage 3  . Hypothyroidism   . Prediabetes   . TIA (transient ischemic attack)   . Hyperglycemia   . Left ventricular diastolic dysfunction   . Hypophonia     Past Surgical History  Procedure Laterality Date  . Cardiovascular stress test  06/2007    NORMAL  . Breast lumpectomy  2010  . Cholecystectomy  1994  . Tonsillectomy    . Nasal sinus surgery      submucous resection late 1950s  . Cataract extraction  2005,2006    Dr.Devonzo    There were no vitals filed for this visit.  Visit Diagnosis:  Posture abnormality  Bradykinesia      Subjective Assessment - 06/14/14 1022    Subjective Daughter present today and pt/daughter have a few questions   Currently in Pain? No/denies        Self Care:  Addressed daughter's questions today regarding patient's back pain, progress with therapy, therapy expectations: 1.  Pt's reported back and R hip pain-Discussed how PT has address pt's complaints of pain so far-general low back exercises for lumbar flexibility, recommendations for ice massage and stretches to  address piriformis tenderness to palpation.  PT discussed today posture/positioning in sitting, supine, standing positions as well as with household activities. (See patient instructions)  PT discussed plan of addressing core stability and lumbar strengthening to attempt to further address pain and improve functional standing activities within the home.  PT ultimately recommended to patient and daughter to discuss low back and right hip pain with physician if pain continues to persist, doesn't improve, or gets worse.  2.  Progress with therapy-Discussed overall progress with therapy since beginning therapy, including functional mobility activities that pt is able to perform at home.  3.  Therapy Expectations-Discussed pt's most recent Berg score with recommendation to continue to use 4-wheeled RW as best safety for walking.  Discussed ultimately that pt may not safely be able to transition away from walker for safety reasons and pt is in agreement.  Discussed reiterating need for widened BOS and improved positioning for activities in therapy and at home.  Discussed plans to check goals by 06/30/14 and decide whether pt is continuing to make progress to justify continued therapy or option to discharge at that point and let patient continue exercise at home.  Recommended patient bring complete HEP into therapy next visit to assess HEP exercises.  Discussed plan to work on core stability and hip/low back strengthening  to assist with improved standing stability.                         PT Education - 2014-07-14 1220    Education provided Yes   Education Details posture/positioning with ADLs, progress towards goals and expectations from therapy   Person(s) Educated Patient;Child(ren)   Methods Explanation;Demonstration;Handout   Comprehension Verbalized understanding          PT Short Term Goals - 05/06/14 1256    PT SHORT TERM GOAL #1   Title Pt will perform HEP with supervision for  improved strength, balance, and gait. (Target date 05/03/14)   Status Not Met   PT SHORT TERM GOAL #2   Title Pt will improve Berg Balance score to at least 25/56 for decreased fall risk.   Status Achieved   PT SHORT TERM GOAL #3   Title Pt will improve TUG score to less than or equal to 30 seconds for improved ADL participation in home.   Status Achieved   PT SHORT TERM GOAL #4   Title Pt will perform at least 8 of 10 reps of sit<>stand with minimal UE support from <18 inch surfaces, for improved transfer efficiency and safety.   Status Not Met           PT Long Term Goals - 05/30/14 1028    PT LONG TERM GOAL #1   Title Pt will verbalize plans for continued community fitness upon D/C from PT   Time 4   Status New   PT LONG TERM GOAL #2   Title Pt will improve Berg Balance score to at least 37/56 for decreased fall risk.   Time 4   Period Weeks   Status New   PT LONG TERM GOAL #3   Title Pt will improve TUG score to less than or equal to 25 seconds for decreased fall risk.   Time 4   Period Weeks   Status New   PT LONG TERM GOAL #4   Title Pt will improve gait velocity to at least 2 ft/sec for improved gait efficiency and safety.   Time 4   Period Weeks   Status New               Plan - 14-Jul-2014 1221    Clinical Impression Statement Due to ongoing concerns from patient and daughter regarding back pain with prolonged standing activities, plan to work on core stability and lumbar strengthening.  Ultimately recommended pt follow up with physician if pain is continuing.   Pt will benefit from skilled therapeutic intervention in order to improve on the following deficits Abnormal gait;Decreased activity tolerance;Decreased balance;Decreased mobility;Decreased strength;Difficulty walking;Postural dysfunction   Rehab Potential Good   PT Frequency 2x / week   PT Duration 4 weeks  this is wk 2 of 4   PT Treatment/Interventions ADLs/Self Care Home Management;Gait  training;Stair training;Functional mobility training;Neuromuscular re-education;Balance training;Therapeutic exercise;Therapeutic activities;Patient/family education   PT Next Visit Plan Review standing HEP additions for weigthshifting, work on core stability and strengthening of lumbar/hip area; try bridging   Consulted and Agree with Plan of Care Patient;Family member/caregiver   Family Member Consulted Daughter          G-Codes - 07-14-2014 1225    Functional Assessment Tool Used Merrilee Jansky 32/56, gait velocity 1.8 ft/sec   Functional Limitation Mobility: Walking and moving around   Mobility: Walking and Moving Around Current Status [phone]) At least 20 percent but less than  40 percent impaired, limited or restricted   Mobility: Walking and Moving Around Goal Status [phone]) At least 20 percent but less than 40 percent impaired, limited or restricted      Problem List Patient Active Problem List   Diagnosis Date Noted  . Encephalopathy, hypertensive 05/29/2014  . Dizziness of unknown cause 02/21/2014  . Left ventricular diastolic dysfunction with preserved systolic function 20/26/6916  . Essential hypertension 12/30/2013  . Transaminitis 12/24/2013  . Palpitations 12/23/2013  . Respiratory insufficiency 12/22/2013  . Normocytic anemia 12/21/2013  . Thrombocytopenia 12/21/2013  . Abnormal ECG 12/15/2013  . Abnormal involuntary movement 12/15/2013  . Colorectal polyps 12/15/2013  . Chest pain 12/15/2013  . Arteriosclerosis of coronary artery 12/15/2013  . Chronic kidney disease (CKD), stage III (moderate) 12/15/2013  . Corn 12/15/2013  . Diabetes 12/15/2013  . Breathing difficult 12/15/2013  . Acid reflux 12/15/2013  . HLD (hyperlipidemia) 12/15/2013  . BP (high blood pressure) 12/15/2013  . FOM (frequency of micturition) 12/15/2013  . Breast cancer, female 12/15/2013  . Fungal infection of nail 12/15/2013  . Osteopenia 12/15/2013  . Raynaud's syndrome 12/15/2013  . Restless leg  12/15/2013  . Gougerout-Sjoegren syndrome 12/15/2013  . Change in blood platelet count 12/15/2013  . Infection of urinary tract 12/15/2013  . Lower urinary tract infection 10/12/2013  . Idiopathic Parkinson's disease 09/06/2013  . Bladder spasm 09/05/2013  . Urgency of micturation 09/05/2013  . Dyspnea 06/15/2012  . Hyperglycemia 12/27/2011  . Sjogren's syndrome 12/17/2010  . TIA (transient ischemic attack) 12/17/2010  . Benign hypertensive heart disease without heart failure 09/01/2010  . Parkinson's disease 09/01/2010  . Hypothyroidism 09/01/2010  . Hypercholesterolemia 09/01/2010  . Chronic anemia 09/01/2010    Michaela Shankel W. 06/14/2014, 12:26 PM  Mady Haagensen, PT 06/14/2014 12:27 PM Phone: [phone] Fax: [facility] [address] [place], [place], [address] Phone: [phone]   Fax:  [phone]

## 2014-06-14 NOTE — Therapy (Addendum)
[facility] [address] [place], [place], [address] Phone: [phone]   Fax:  [phone]  Occupational Therapy Treatment  Patient Details  Name: Victoria Lindsey MRN: [id number] Date of Birth: [birth year] Referring Provider:  [name], DO  Encounter Date: 06/14/2014      OT End of Session - 06/18/14 1151    Visit Number 21   Number of Visits 31   Date for OT Re-Evaluation 07/27/14   Authorization Time Period G code needed, renewed 05/28/14          OT Start Time 1148   OT Stop Time 1230   OT Time Calculation (min) 42 min      Past Medical History  Diagnosis Date  . Hypertension   . Parkinson's disease   . Hyperlipidemia   . Dizziness   . Orthostatic hypotension   . Anemia   . Tremor   . Breast cancer     LEFT BREAST  . Kidney failure     stage 3  . Hypothyroidism   . Prediabetes   . TIA (transient ischemic attack)   . Hyperglycemia   . Left ventricular diastolic dysfunction   . Hypophonia     Past Surgical History  Procedure Laterality Date  . Cardiovascular stress test  06/2007    NORMAL  . Breast lumpectomy  2010  . Cholecystectomy  1994  . Tonsillectomy    . Nasal sinus surgery      submucous resection late 1950s  . Cataract extraction  2005,2006    Dr.Devonzo    There were no vitals filed for this visit.  Visit Diagnosis:  Bradykinesia  Rigidity  Posture abnormality  Weakness  Decreased coordination      Subjective Assessment - 06/18/14 1151    Currently in Pain? No/denies      Treatment: Seated functional reaching with trunk rotation and emphasis on large amplitude movements to place large pegs in pegboard with each UE, min v.c Eucation provided with pt/ daughter regarding ways to incorporate big movements with ADLS. Pt simulated ironing in a seated position, and she plans to perform at home. Pt practiced donning/ doffing jacket in seated following v.c. And  demonstration for technique.                        OT Short Term Goals - 05/28/14 1113    OT SHORT TERM GOAL #1   Title I with inital HEP.   Baseline ck 05/03/14   Time 4   Period Weeks   Status Achieved   OT SHORT TERM GOAL #2   Title Pt / family will verbalize understanding of adapted strategies for ADLS/ IADLS.   Time 4   Period Weeks   Status Achieved   OT SHORT TERM GOAL #3   Title Pt will demonstrate improved indpendence with dressing as evidenced by decreasing PPT #4 to 45 secs or less.   Baseline 05/28/14-36.79 secs   Time 4   Period Weeks   Status Achieved  not met 04/29/14:  53.66sec   OT SHORT TERM GOAL #4   Title Pt will demonstrate ability to write 4 sentences with 100 % legibility and minimal decrease in letter size.   Baseline 100% legibility and no significant decrease in size, 05/28/14   Time 4   Period Weeks   Status Achieved  Not met 04/29/14--95% legibility and min-mod decrease in size   OT SHORT TERM GOAL #5  Title Upgraded goal- Pt will demonstrate improved indpendence with dressing as evidenced by performing PPT #4 in 30 secs without LOB.   Time 4   Period Weeks   Status New   Additional Short Term Goals   Additional Short Term Goals Yes   OT SHORT TERM GOAL #6   Title Pt will perform simple stove top cooking task with close supervision.   Baseline due 06/27/14   Time 4   Period Weeks   Status New           OT Long Term Goals - 05/28/14 1113    OT LONG TERM GOAL #1   Title Pt will perfom dressing with supervision/set up.   Baseline ck 06/01/14   Time 8   Period Weeks   Status Achieved   OT LONG TERM GOAL #2   Title Pt will perform light home management/ snack prep with min A.   Baseline pt makes her own sausage biscuit and clleans out sink.   Time 8   Period Weeks   Status Achieved   OT LONG TERM GOAL #3   Title Pt will demonstrate improved standing balance for ADLS as evidenced by increasing standing functional reach  by 3 inches bilaterally.(see below for revised goal on 05/28/14)   Baseline 05/28/14-RUE 8.5, LUE 9.25- met for LUE , RUE ongoing, revised goal : increase RUE standing functional reach to 9.5 inches for increased standing balance for ADLs.   Time 8   Period Weeks   Status On-going   OT LONG TERM GOAL #4   Title Pt will demonstrate increased ease with feeding as evidenced by decreasing PPT #2 to 10 secs or less.   Baseline 12.91 secs- 05/28/14, not fully met, continue goal   Time 8   Period Weeks   Status On-going   OT LONG TERM GOAL #5   Title Pt/ family will verbalize understanding of compensatory strategies for cognition and ways to keep thinking skills sharp   Baseline handout issued 05/24/14   Time 8   Period Weeks   Status Achieved   Long Term Additional Goals   Additional Long Term Goals Yes   OT LONG TERM GOAL #6   Title Pt will perform basic cooking/ home management in standing at a modified independent level   Time 8   Period Weeks   Status New               Plan - 06/18/14 1154    Clinical Impression Statement Pt is progressing towards goals. She has not attempted cooking at home yet.   Plan continue to reinforce large amplitude movements with ADLS.   OT Home Exercise Plan  HEP issued-PWR seated, coordination, bag exercises (crumple, behind head, behind back, in fron pass), ways to incorporate big movements into ADLs   Consulted and Agree with Plan of Care Patient;Family member/caregiver   Family Member Consulted husband        Problem List Patient Active Problem List   Diagnosis Date Noted  . Encephalopathy, hypertensive 05/29/2014  . Dizziness of unknown cause 02/21/2014  . Left ventricular diastolic dysfunction with preserved systolic function 35/36/1443  . Essential hypertension 12/30/2013  . Transaminitis 12/24/2013  . Palpitations 12/23/2013  . Respiratory insufficiency 12/22/2013  . Normocytic anemia 12/21/2013  . Thrombocytopenia 12/21/2013  .  Abnormal ECG 12/15/2013  . Abnormal involuntary movement 12/15/2013  . Colorectal polyps 12/15/2013  . Chest pain 12/15/2013  . Arteriosclerosis of coronary artery 12/15/2013  . Chronic kidney disease (  CKD), stage III (moderate) 12/15/2013  . Corn 12/15/2013  . Diabetes 12/15/2013  . Breathing difficult 12/15/2013  . Acid reflux 12/15/2013  . HLD (hyperlipidemia) 12/15/2013  . BP (high blood pressure) 12/15/2013  . FOM (frequency of micturition) 12/15/2013  . Breast cancer, female 12/15/2013  . Fungal infection of nail 12/15/2013  . Osteopenia 12/15/2013  . Raynaud's syndrome 12/15/2013  . Restless leg 12/15/2013  . Gougerout-Sjoegren syndrome 12/15/2013  . Change in blood platelet count 12/15/2013  . Infection of urinary tract 12/15/2013  . Lower urinary tract infection 10/12/2013  . Idiopathic Parkinson's disease 09/06/2013  . Bladder spasm 09/05/2013  . Urgency of micturation 09/05/2013  . Dyspnea 06/15/2012  . Hyperglycemia 12/27/2011  . Sjogren's syndrome 12/17/2010  . TIA (transient ischemic attack) 12/17/2010  . Benign hypertensive heart disease without heart failure 09/01/2010  . Parkinson's disease 09/01/2010  . Hypothyroidism 09/01/2010  . Hypercholesterolemia 09/01/2010  . Chronic anemia 09/01/2010    Vilma Will 06/18/2014, 12:10 PM Theone Murdoch, OTR/L [phone] Phone: [phone] 12:10 PM 06/18/2014 [facility] [address] [place], [place], [address] Phone: [phone]   Fax:  [phone]

## 2014-06-14 NOTE — Therapy (Signed)
[facility] [address] [place], [place], [address] Phone: [phone]   Fax:  [phone]  Speech Language Pathology Treatment  Patient Details  Name: Victoria Lindsey MRN: [id number] Date of Birth: 1925-05-01 Referring Provider:  [name], DO  Encounter Date: 06/14/2014      End of Session - 06/14/14 1223    Visit Number 12   Number of Visits 16   Date for SLP Re-Evaluation 07/07/14   SLP Start Time 1150   SLP Stop Time  1230   SLP Time Calculation (min) 40 min   Activity Tolerance Patient tolerated treatment well      Past Medical History  Diagnosis Date  . Hypertension   . Parkinson's disease   . Hyperlipidemia   . Dizziness   . Orthostatic hypotension   . Anemia   . Tremor   . Breast cancer     LEFT BREAST  . Kidney failure     stage 3  . Hypothyroidism   . Prediabetes   . TIA (transient ischemic attack)   . Hyperglycemia   . Left ventricular diastolic dysfunction   . Hypophonia     Past Surgical History  Procedure Laterality Date  . Cardiovascular stress test  06/2007    NORMAL  . Breast lumpectomy  2010  . Cholecystectomy  1994  . Tonsillectomy    . Nasal sinus surgery      submucous resection late 1950s  . Cataract extraction  2005,2006    Dr.Devonzo    There were no vitals filed for this visit.  Visit Diagnosis: Hypokinetic Parkinsonian dysphonia      Subjective Assessment - 06/14/14 1215    Subjective "(Breathing and talking loud) is hard to do because I forget."               ADULT SLP TREATMENT - 06/14/14 1150    General Information   Behavior/Cognition Alert;Cooperative;Pleasant mood   Treatment Provided   Treatment provided Cognitive-Linquistic   Pain Assessment   Pain Assessment No/denies pain   Cognitive-Linquistic Treatment   Treatment focused on Dysarthria   Skilled Treatment loud /a/ with average 86dB independently. Sentence (pic description) with  occasional min cues for breathing and loudness above 70dB, as task progressed, usual cues needed. Questions about cards responded to by pt with usual min cues for breathing. Simple conversation between tasks without consistent breathing, or volume >70dB.    Assessment / Recommendations / Plan   Plan Continue with current plan of care   Progression Toward Goals   Progression toward goals Progressing toward goals            SLP Short Term Goals - 06/07/14 1103    SLP SHORT TERM GOAL #1   Title pt will maintain average of loud /a/ at 80dB for 5 sessions   Status Not Met  cues needed   SLP SHORT TERM GOAL #2   Title pt will produce 16/20 sentences with average 70dB   Status Not Met   SLP SHORT TERM GOAL #3   Title pt will improve loudness in 5 minutes simple conversation to 70dB with rare min A   Status Not Met          SLP Long Term Goals - 06/14/14 1224    SLP LONG TERM GOAL #1   Title pt will maintain average loudness of 80dB with loud /a/ over 7 sessions with rare min A   Time 3   Period Weeks  Status Revised   SLP LONG TERM GOAL #2   Title pt will improve loudness to 70dB in 5 minutes simple/mod complex conversation with occasional min A   Time 3   Period Weeks   Status Revised   SLP LONG TERM GOAL #3   Title pt will report feeling abdominal movement necessary for louder speech in simple conversation with occasional min A   Time 3   Period Weeks   Status Revised          Plan - 06/14/14 1223    Clinical Impression Statement Better consistency with breathing and louder speech in structured tasks than in past sessions. Cont ST needed to cont to attempt to carry over louder speech to situations outside ST room.   Speech Therapy Frequency 2x / week   Duration --  3 weeks   Treatment/Interventions Internal/external aids;Functional tasks;Cueing hierarchy;Patient/family education;Compensatory strategies;SLP instruction and feedback   Potential to Achieve Goals Good    Potential Considerations Ability to learn/carryover information;Severity of impairments        Problem List Patient Active Problem List   Diagnosis Date Noted  . Encephalopathy, hypertensive 05/29/2014  . Dizziness of unknown cause 02/21/2014  . Left ventricular diastolic dysfunction with preserved systolic function 82/50/5397  . Essential hypertension 12/30/2013  . Transaminitis 12/24/2013  . Palpitations 12/23/2013  . Respiratory insufficiency 12/22/2013  . Normocytic anemia 12/21/2013  . Thrombocytopenia 12/21/2013  . Abnormal ECG 12/15/2013  . Abnormal involuntary movement 12/15/2013  . Colorectal polyps 12/15/2013  . Chest pain 12/15/2013  . Arteriosclerosis of coronary artery 12/15/2013  . Chronic kidney disease (CKD), stage III (moderate) 12/15/2013  . Corn 12/15/2013  . Diabetes 12/15/2013  . Breathing difficult 12/15/2013  . Acid reflux 12/15/2013  . HLD (hyperlipidemia) 12/15/2013  . BP (high blood pressure) 12/15/2013  . FOM (frequency of micturition) 12/15/2013  . Breast cancer, female 12/15/2013  . Fungal infection of nail 12/15/2013  . Osteopenia 12/15/2013  . Raynaud's syndrome 12/15/2013  . Restless leg 12/15/2013  . Gougerout-Sjoegren syndrome 12/15/2013  . Change in blood platelet count 12/15/2013  . Infection of urinary tract 12/15/2013  . Lower urinary tract infection 10/12/2013  . Idiopathic Parkinson's disease 09/06/2013  . Bladder spasm 09/05/2013  . Urgency of micturation 09/05/2013  . Dyspnea 06/15/2012  . Hyperglycemia 12/27/2011  . Sjogren's syndrome 12/17/2010  . TIA (transient ischemic attack) 12/17/2010  . Benign hypertensive heart disease without heart failure 09/01/2010  . Parkinson's disease 09/01/2010  . Hypothyroidism 09/01/2010  . Hypercholesterolemia 09/01/2010  . Chronic anemia 09/01/2010    Garald Balding , SLP  06/14/2014, 12:26 PM  [facility] [address] [place], [place], [address] Phone: [phone]   Fax:  [phone]

## 2014-06-14 NOTE — Patient Instructions (Signed)

## 2014-06-18 ENCOUNTER — Ambulatory Visit: Payer: Medicare Other | Admitting: Occupational Therapy

## 2014-06-18 ENCOUNTER — Ambulatory Visit: Payer: Medicare Other

## 2014-06-18 ENCOUNTER — Ambulatory Visit: Payer: Medicare Other | Admitting: Physical Therapy

## 2014-06-18 DIAGNOSIS — R279 Unspecified lack of coordination: Secondary | ICD-10-CM

## 2014-06-18 DIAGNOSIS — R258 Other abnormal involuntary movements: Secondary | ICD-10-CM

## 2014-06-18 DIAGNOSIS — R278 Other lack of coordination: Secondary | ICD-10-CM

## 2014-06-18 DIAGNOSIS — R531 Weakness: Secondary | ICD-10-CM

## 2014-06-18 DIAGNOSIS — R29898 Other symptoms and signs involving the musculoskeletal system: Secondary | ICD-10-CM

## 2014-06-18 DIAGNOSIS — R490 Dysphonia: Secondary | ICD-10-CM

## 2014-06-18 DIAGNOSIS — R293 Abnormal posture: Secondary | ICD-10-CM

## 2014-06-18 NOTE — Therapy (Signed)
[facility] [address] [place], [place], [address] Phone: [phone]   Fax:  [phone]  Speech Language Pathology Treatment  Patient Details  Name: Victoria Lindsey MRN: [id number] Date of Birth: Jan 16, 1926 Referring Provider:  [name], DO  Encounter Date: 06/18/2014      End of Session - 06/18/14 1107    SLP Start Time 1021   SLP Stop Time  1103   SLP Time Calculation (min) 42 min   Activity Tolerance Patient tolerated treatment well      Past Medical History  Diagnosis Date  . Hypertension   . Parkinson's disease   . Hyperlipidemia   . Dizziness   . Orthostatic hypotension   . Anemia   . Tremor   . Breast cancer     LEFT BREAST  . Kidney failure     stage 3  . Hypothyroidism   . Prediabetes   . TIA (transient ischemic attack)   . Hyperglycemia   . Left ventricular diastolic dysfunction   . Hypophonia     Past Surgical History  Procedure Laterality Date  . Cardiovascular stress test  06/2007    NORMAL  . Breast lumpectomy  2010  . Cholecystectomy  1994  . Tonsillectomy    . Nasal sinus surgery      submucous resection late 1950s  . Cataract extraction  2005,2006    Dr.Devonzo    There were no vitals filed for this visit.  Visit Diagnosis: Hypokinetic Parkinsonian dysphonia      Subjective Assessment - 06/18/14 1047    Subjective Pt arrived with husband, but husband did not come back to tx room. She reports practicing /a/ at home "whenever I feel like it."               ADULT SLP TREATMENT - 06/18/14 1028    General Information   Behavior/Cognition Alert;Cooperative;Pleasant mood   Treatment Provided   Treatment provided Cognitive-Linquistic   Pain Assessment   Pain Assessment No/denies pain   Cognitive-Linquistic Treatment   Treatment focused on Dysarthria   Skilled Treatment Loud /a/ completed with average 86dB and 10 seconds.  Picture description task  completed with usual min A for loudenss and full breath.  In 5 minutes simple conversation pt req'd mod cues occasionally from SLP for loudness and breathing. In sentence tasks (problem solving), pt req'd usual min-mod A for full breath and loud speech.   Assessment / Recommendations / Plan   Plan Continue with current plan of care            SLP Short Term Goals - 06/07/14 1103    SLP SHORT TERM GOAL #1   Title pt will maintain average of loud /a/ at 80dB for 5 sessions   Status Not Met  cues needed   SLP SHORT TERM GOAL #2   Title pt will produce 16/20 sentences with average 70dB   Status Not Met   SLP SHORT TERM GOAL #3   Title pt will improve loudness in 5 minutes simple conversation to 70dB with rare min A   Status Not Met          SLP Long Term Goals - 06/18/14 1111    SLP LONG TERM GOAL #1   Title pt will maintain average loudness of 80dB with loud /a/ over 7 sessions with rare min A   Time 2   Period Weeks   Status Revised   SLP LONG TERM GOAL #2  Title pt will improve loudness to 70dB in 5 minutes simple/mod complex conversation with occasional min A   Time 2   Period Weeks   Status Revised   SLP LONG TERM GOAL #3   Title pt will report feeling abdominal movement necessary for louder speech in simple conversation with occasional min A   Time 2   Period Weeks   Status Revised          Plan - 06/18/14 1107    Clinical Impression Statement Pt with mod cues consistently necessary for breath and loud speech, in sentence responses. Cont ST to maximize speech loudness across situations.        Problem List Patient Active Problem List   Diagnosis Date Noted  . Encephalopathy, hypertensive 05/29/2014  . Dizziness of unknown cause 02/21/2014  . Left ventricular diastolic dysfunction with preserved systolic function 19/62/2297  . Essential hypertension 12/30/2013  . Transaminitis 12/24/2013  . Palpitations 12/23/2013  . Respiratory insufficiency  12/22/2013  . Normocytic anemia 12/21/2013  . Thrombocytopenia 12/21/2013  . Abnormal ECG 12/15/2013  . Abnormal involuntary movement 12/15/2013  . Colorectal polyps 12/15/2013  . Chest pain 12/15/2013  . Arteriosclerosis of coronary artery 12/15/2013  . Chronic kidney disease (CKD), stage III (moderate) 12/15/2013  . Corn 12/15/2013  . Diabetes 12/15/2013  . Breathing difficult 12/15/2013  . Acid reflux 12/15/2013  . HLD (hyperlipidemia) 12/15/2013  . BP (high blood pressure) 12/15/2013  . FOM (frequency of micturition) 12/15/2013  . Breast cancer, female 12/15/2013  . Fungal infection of nail 12/15/2013  . Osteopenia 12/15/2013  . Raynaud's syndrome 12/15/2013  . Restless leg 12/15/2013  . Gougerout-Sjoegren syndrome 12/15/2013  . Change in blood platelet count 12/15/2013  . Infection of urinary tract 12/15/2013  . Lower urinary tract infection 10/12/2013  . Idiopathic Parkinson's disease 09/06/2013  . Bladder spasm 09/05/2013  . Urgency of micturation 09/05/2013  . Dyspnea 06/15/2012  . Hyperglycemia 12/27/2011  . Sjogren's syndrome 12/17/2010  . TIA (transient ischemic attack) 12/17/2010  . Benign hypertensive heart disease without heart failure 09/01/2010  . Parkinson's disease 09/01/2010  . Hypothyroidism 09/01/2010  . Hypercholesterolemia 09/01/2010  . Chronic anemia 09/01/2010    Garald Balding, SLP 06/18/2014, 11:11 AM  [facility] [address] [place], [place], [address] Phone: [phone]   Fax:  [phone]

## 2014-06-18 NOTE — Therapy (Signed)
[facility] [address] [place], [place], [address] Phone: [phone]   Fax:  [phone]  Physical Therapy Treatment  Patient Details  Name: Victoria Lindsey MRN: [id number] Date of Birth: [birth year] Referring Provider:  [name], DO  Encounter Date: 06/18/2014      PT End of Session - 06/18/14 1418    Visit Number 21   Number of Visits 24   Date for PT Re-Evaluation 07/04/14   PT Start Time 1112  pt started late-went to restroom after speech therapy   PT Stop Time 1145   PT Time Calculation (min) 33 min   Activity Tolerance Patient tolerated treatment well   Behavior During Therapy [facility] for tasks assessed/performed      Past Medical History  Diagnosis Date  . Hypertension   . Parkinson's disease   . Hyperlipidemia   . Dizziness   . Orthostatic hypotension   . Anemia   . Tremor   . Breast cancer     LEFT BREAST  . Kidney failure     stage 3  . Hypothyroidism   . Prediabetes   . TIA (transient ischemic attack)   . Hyperglycemia   . Left ventricular diastolic dysfunction   . Hypophonia     Past Surgical History  Procedure Laterality Date  . Cardiovascular stress test  06/2007    NORMAL  . Breast lumpectomy  2010  . Cholecystectomy  1994  . Tonsillectomy    . Nasal sinus surgery      submucous resection late 1950s  . Cataract extraction  2005,2006    Dr.Devonzo    There were no vitals filed for this visit.  Visit Diagnosis:  Rigidity  Weakness  Posture abnormality      Subjective Assessment - 06/18/14 1115    Subjective No pain today; went to Sunday School this weekend.  Almost fell over the weekend and husband grabber her; started backwards and he grabbed hand to prevent fall.   Currently in Pain? No/denies       Therapeutic Exercise:  Supine pelvic tilts 2 sets x 10 reps for improved pelvic mobility; Bridging 2 sets x 5 reps for hip strengthening; Abdominal activation in  hooklying position with hip flexion 5 sets x 2 reps, then hooklying abduction/adduction with abdominal activation 5 sets x 3 reps each.  Seated activities for core stabilization on green balance disk:  Anterior/posterior pelvic tilts with facilitation cues, then lateral weightshifting and pelvic tilts x 10 reps; abdominal activation with alternating UE lifts, rhythmic stabilization seated on green balance disk with resistance given at shoulders, then at outstretched arms x 10 reps each.  Seated core stabilization activities on green therapy ball:  Bouncing 2 sets x 10 reps, then forward/back rocking for anterior/posterior weightshifting 2 sets x 10, then lateral weightshifting 2 sets x 10 with min guard assistance.  Seated on therapy ball:  Abdominal activation with UE lifts x 10, then marching x 10 reps.                          PT Short Term Goals - 05/06/14 1256    PT SHORT TERM GOAL #1   Title Pt will perform HEP with supervision for improved strength, balance, and gait. (Target date 05/03/14)   Status Not Met   PT SHORT TERM GOAL #2   Title Pt will improve Berg Balance score to at least 25/56 for decreased fall risk.  Status Achieved   PT SHORT TERM GOAL #3   Title Pt will improve TUG score to less than or equal to 30 seconds for improved ADL participation in home.   Status Achieved   PT SHORT TERM GOAL #4   Title Pt will perform at least 8 of 10 reps of sit<>stand with minimal UE support from <18 inch surfaces, for improved transfer efficiency and safety.   Status Not Met           PT Long Term Goals - 05/30/14 1028    PT LONG TERM GOAL #1   Title Pt will verbalize plans for continued community fitness upon D/C from PT   Time 4   Status New   PT LONG TERM GOAL #2   Title Pt will improve Berg Balance score to at least 37/56 for decreased fall risk.   Time 4   Period Weeks   Status New   PT LONG TERM GOAL #3   Title Pt will improve TUG score to less than or  equal to 25 seconds for decreased fall risk.   Time 4   Period Weeks   Status New   PT LONG TERM GOAL #4   Title Pt will improve gait velocity to at least 2 ft/sec for improved gait efficiency and safety.   Time 4   Period Weeks   Status New               Plan - 06/18/14 1419    Clinical Impression Statement No complaints of hip or back pain during core stabilization and pelvic mobility activities today.   Pt will benefit from skilled therapeutic intervention in order to improve on the following deficits Abnormal gait;Decreased activity tolerance;Decreased balance;Decreased mobility;Decreased strength;Difficulty walking;Postural dysfunction   Rehab Potential Good   PT Frequency 2x / week   PT Duration 4 weeks  wk 3 of 4   PT Treatment/Interventions ADLs/Self Care Home Management;Gait training;Stair training;Functional mobility training;Neuromuscular re-education;Balance training;Therapeutic exercise;Therapeutic activities;Patient/family education   PT Next Visit Plan Review standing HEP additions for weigthshifting, work on core stability and strengthening of lumbar/hip area   Consulted and Agree with Plan of Care Patient;Family member/caregiver   Family Member Consulted husband        Problem List Patient Active Problem List   Diagnosis Date Noted  . Encephalopathy, hypertensive 05/29/2014  . Dizziness of unknown cause 02/21/2014  . Left ventricular diastolic dysfunction with preserved systolic function 16/38/4536  . Essential hypertension 12/30/2013  . Transaminitis 12/24/2013  . Palpitations 12/23/2013  . Respiratory insufficiency 12/22/2013  . Normocytic anemia 12/21/2013  . Thrombocytopenia 12/21/2013  . Abnormal ECG 12/15/2013  . Abnormal involuntary movement 12/15/2013  . Colorectal polyps 12/15/2013  . Chest pain 12/15/2013  . Arteriosclerosis of coronary artery 12/15/2013  . Chronic kidney disease (CKD), stage III (moderate) 12/15/2013  . Corn 12/15/2013   . Diabetes 12/15/2013  . Breathing difficult 12/15/2013  . Acid reflux 12/15/2013  . HLD (hyperlipidemia) 12/15/2013  . BP (high blood pressure) 12/15/2013  . FOM (frequency of micturition) 12/15/2013  . Breast cancer, female 12/15/2013  . Fungal infection of nail 12/15/2013  . Osteopenia 12/15/2013  . Raynaud's syndrome 12/15/2013  . Restless leg 12/15/2013  . Gougerout-Sjoegren syndrome 12/15/2013  . Change in blood platelet count 12/15/2013  . Infection of urinary tract 12/15/2013  . Lower urinary tract infection 10/12/2013  . Idiopathic Parkinson's disease 09/06/2013  . Bladder spasm 09/05/2013  . Urgency of micturation 09/05/2013  . Dyspnea 06/15/2012  .  Hyperglycemia 12/27/2011  . Sjogren's syndrome 12/17/2010  . TIA (transient ischemic attack) 12/17/2010  . Benign hypertensive heart disease without heart failure 09/01/2010  . Parkinson's disease 09/01/2010  . Hypothyroidism 09/01/2010  . Hypercholesterolemia 09/01/2010  . Chronic anemia 09/01/2010    Genna Casimir W. 06/18/2014, 2:21 PM  Gale Hulse Gerrit Friends, PT 06/18/2014 2:22 PM Phone: [phone] Fax: [facility] [address] [place], [place], [address] Phone: [phone]   Fax:  [phone]

## 2014-06-18 NOTE — Therapy (Signed)
[facility] [address] [place], [place], [address] Phone: [phone]   Fax:  [phone]  Occupational Therapy Treatment  Patient Details  Name: Victoria Lindsey MRN: [id number] Date of Birth: March 25, 1925 Referring Provider:  [name], DO  Encounter Date: 06/18/2014      OT End of Session - 06/18/14 1151    Visit Number 21   Number of Visits 31   Date for OT Re-Evaluation 07/27/14   Authorization Time Period G code needed, renewed 05/28/14          OT Start Time 1148   OT Stop Time 1230   OT Time Calculation (min) 42 min      Past Medical History  Diagnosis Date  . Hypertension   . Parkinson's disease   . Hyperlipidemia   . Dizziness   . Orthostatic hypotension   . Anemia   . Tremor   . Breast cancer     LEFT BREAST  . Kidney failure     stage 3  . Hypothyroidism   . Prediabetes   . TIA (transient ischemic attack)   . Hyperglycemia   . Left ventricular diastolic dysfunction   . Hypophonia     Past Surgical History  Procedure Laterality Date  . Cardiovascular stress test  06/2007    NORMAL  . Breast lumpectomy  2010  . Cholecystectomy  1994  . Tonsillectomy    . Nasal sinus surgery      submucous resection late 1950s  . Cataract extraction  2005,2006    Dr.Devonzo    There were no vitals filed for this visit.  Visit Diagnosis:  Bradykinesia  Rigidity  Weakness  Decreased coordination      Subjective Assessment - 06/18/14 1151    Currently in Pain? No/denies       Treatment: Reviewed bag exercises for simulated ADLS, min v.c. then pt returned demonstration (crumpling bag, behind head to simulate donning shirt, in front and behind back,) Arm bike x 5 mins level 1, pt was able to maintain 25-30 RPM. Dynamic standing activity while reaching for graded clothespins with right then left UE with trunk rotation, min v.c.for PWR! Hands.  Close supervision-minguard for  balance.                       OT Short Term Goals - 05/28/14 1113    OT SHORT TERM GOAL #1   Title I with inital HEP.   Baseline ck 05/03/14   Time 4   Period Weeks   Status Achieved   OT SHORT TERM GOAL #2   Title Pt / family will verbalize understanding of adapted strategies for ADLS/ IADLS.   Time 4   Period Weeks   Status Achieved   OT SHORT TERM GOAL #3   Title Pt will demonstrate improved indpendence with dressing as evidenced by decreasing PPT #4 to 45 secs or less.   Baseline 05/28/14-36.79 secs   Time 4   Period Weeks   Status Achieved  not met 04/29/14:  53.66sec   OT SHORT TERM GOAL #4   Title Pt will demonstrate ability to write 4 sentences with 100 % legibility and minimal decrease in letter size.   Baseline 100% legibility and no significant decrease in size, 05/28/14   Time 4   Period Weeks   Status Achieved  Not met 04/29/14--95% legibility and min-mod decrease in size   OT SHORT TERM GOAL #5   Title  Upgraded goal- Pt will demonstrate improved indpendence with dressing as evidenced by performing PPT #4 in 30 secs without LOB.   Time 4   Period Weeks   Status New   Additional Short Term Goals   Additional Short Term Goals Yes   OT SHORT TERM GOAL #6   Title Pt will perform simple stove top cooking task with close supervision.   Baseline due 06/27/14   Time 4   Period Weeks   Status New           OT Long Term Goals - 05/28/14 1113    OT LONG TERM GOAL #1   Title Pt will perfom dressing with supervision/set up.   Baseline ck 06/01/14   Time 8   Period Weeks   Status Achieved   OT LONG TERM GOAL #2   Title Pt will perform light home management/ snack prep with min A.   Baseline pt makes her own sausage biscuit and clleans out sink.   Time 8   Period Weeks   Status Achieved   OT LONG TERM GOAL #3   Title Pt will demonstrate improved standing balance for ADLS as evidenced by increasing standing functional reach by 3 inches  bilaterally.(see below for revised goal on 05/28/14)   Baseline 05/28/14-RUE 8.5, LUE 9.25- met for LUE , RUE ongoing, revised goal : increase RUE standing functional reach to 9.5 inches for increased standing balance for ADLs.   Time 8   Period Weeks   Status On-going   OT LONG TERM GOAL #4   Title Pt will demonstrate increased ease with feeding as evidenced by decreasing PPT #2 to 10 secs or less.   Baseline 12.91 secs- 05/28/14, not fully met, continue goal   Time 8   Period Weeks   Status On-going   OT LONG TERM GOAL #5   Title Pt/ family will verbalize understanding of compensatory strategies for cognition and ways to keep thinking skills sharp   Baseline handout issued 05/24/14   Time 8   Period Weeks   Status Achieved   Long Term Additional Goals   Additional Long Term Goals Yes   OT LONG TERM GOAL #6   Title Pt will perform basic cooking/ home management in standing at a modified independent level   Time 8   Period Weeks   Status New               Plan - 06/18/14 1154    Clinical Impression Statement Pt is progressing towards goals. She has not attempted cooking at home yet.   Plan continue to reinforce large amplitude movements with ADLS.   OT Home Exercise Plan  HEP issued-PWR seated, coordination, bag exercises (crumple, behind head, behind back, in fron pass), ways to incorporate big movements into ADLs   Consulted and Agree with Plan of Care Patient;Family member/caregiver   Family Member Consulted husband        Problem List Patient Active Problem List   Diagnosis Date Noted  . Encephalopathy, hypertensive 05/29/2014  . Dizziness of unknown cause 02/21/2014  . Left ventricular diastolic dysfunction with preserved systolic function 80/05/4915  . Essential hypertension 12/30/2013  . Transaminitis 12/24/2013  . Palpitations 12/23/2013  . Respiratory insufficiency 12/22/2013  . Normocytic anemia 12/21/2013  . Thrombocytopenia 12/21/2013  . Abnormal ECG  12/15/2013  . Abnormal involuntary movement 12/15/2013  . Colorectal polyps 12/15/2013  . Chest pain 12/15/2013  . Arteriosclerosis of coronary artery 12/15/2013  . Chronic kidney disease (CKD),  stage III (moderate) 12/15/2013  . Corn 12/15/2013  . Diabetes 12/15/2013  . Breathing difficult 12/15/2013  . Acid reflux 12/15/2013  . HLD (hyperlipidemia) 12/15/2013  . BP (high blood pressure) 12/15/2013  . FOM (frequency of micturition) 12/15/2013  . Breast cancer, female 12/15/2013  . Fungal infection of nail 12/15/2013  . Osteopenia 12/15/2013  . Raynaud's syndrome 12/15/2013  . Restless leg 12/15/2013  . Gougerout-Sjoegren syndrome 12/15/2013  . Change in blood platelet count 12/15/2013  . Infection of urinary tract 12/15/2013  . Lower urinary tract infection 10/12/2013  . Idiopathic Parkinson's disease 09/06/2013  . Bladder spasm 09/05/2013  . Urgency of micturation 09/05/2013  . Dyspnea 06/15/2012  . Hyperglycemia 12/27/2011  . Sjogren's syndrome 12/17/2010  . TIA (transient ischemic attack) 12/17/2010  . Benign hypertensive heart disease without heart failure 09/01/2010  . Parkinson's disease 09/01/2010  . Hypothyroidism 09/01/2010  . Hypercholesterolemia 09/01/2010  . Chronic anemia 09/01/2010    RINE,KATHRYN 06/18/2014, 1:32 PM Theone Murdoch, OTR/L [phone] Phone: [phone] 1:32 PM 06/18/2014 [facility] [address] [place], [place], [address] Phone: [phone]   Fax:  [phone]

## 2014-06-20 ENCOUNTER — Ambulatory Visit: Payer: Medicare Other | Admitting: Physical Therapy

## 2014-06-20 ENCOUNTER — Ambulatory Visit: Payer: Medicare Other | Admitting: Occupational Therapy

## 2014-06-20 ENCOUNTER — Ambulatory Visit: Payer: Medicare Other | Admitting: Speech Pathology

## 2014-06-20 DIAGNOSIS — R29898 Other symptoms and signs involving the musculoskeletal system: Secondary | ICD-10-CM

## 2014-06-20 DIAGNOSIS — R293 Abnormal posture: Secondary | ICD-10-CM

## 2014-06-20 DIAGNOSIS — R279 Unspecified lack of coordination: Secondary | ICD-10-CM

## 2014-06-20 DIAGNOSIS — R490 Dysphonia: Secondary | ICD-10-CM

## 2014-06-20 DIAGNOSIS — R531 Weakness: Secondary | ICD-10-CM

## 2014-06-20 DIAGNOSIS — R278 Other lack of coordination: Secondary | ICD-10-CM

## 2014-06-20 DIAGNOSIS — G20 Parkinson's disease: Secondary | ICD-10-CM

## 2014-06-20 DIAGNOSIS — R258 Other abnormal involuntary movements: Secondary | ICD-10-CM

## 2014-06-20 DIAGNOSIS — R269 Unspecified abnormalities of gait and mobility: Secondary | ICD-10-CM

## 2014-06-20 NOTE — Therapy (Signed)
[facility] [address] [place], [place], [address] Phone: [phone]   Fax:  [phone]  Occupational Therapy Treatment  Patient Details  Name: Victoria Lindsey MRN: [id number] Date of Birth: [birth year] Referring Provider:  [name], DO  Encounter Date: 06/20/2014      OT End of Session - 06/20/14 1459    Visit Number 22   Number of Visits 31   Date for OT Re-Evaluation 07/27/14   Authorization Type Medicare   Authorization Time Period G code needed, renewed 05/28/14          Authorization - Visit Number 74   Authorization - Number of Visits 30   OT Start Time 6962   OT Stop Time 1532   OT Time Calculation (min) 39 min      Past Medical History  Diagnosis Date  . Hypertension   . Parkinson's disease   . Hyperlipidemia   . Dizziness   . Orthostatic hypotension   . Anemia   . Tremor   . Breast cancer     LEFT BREAST  . Kidney failure     stage 3  . Hypothyroidism   . Prediabetes   . TIA (transient ischemic attack)   . Hyperglycemia   . Left ventricular diastolic dysfunction   . Hypophonia     Past Surgical History  Procedure Laterality Date  . Cardiovascular stress test  06/2007    NORMAL  . Breast lumpectomy  2010  . Cholecystectomy  1994  . Tonsillectomy    . Nasal sinus surgery      submucous resection late 1950s  . Cataract extraction  2005,2006    Dr.Devonzo    There were no vitals filed for this visit.  Visit Diagnosis:  Bradykinesia  Rigidity  Decreased coordination  Posture abnormality  Weakness      Subjective Assessment - 06/20/14 1454    Subjective  Pt/husband reports that pt did a little more cooking since last visit--fried some chicken   Currently in Pain? No/denies                    OT Treatments/Exercises (OP) - 06/20/14 0001    ADLs   Functional Mobility min v.c. given for use of hands for incr safety with transfers (reach for/push off  chair) and walker placement when doing cooking/home maintenance tasks.   Neurological Re-education Exercises   Other Exercises 1 Functional step and reach forward/back  and out with trunk rotation and wt. shift with min-mod cues for big movements/posture   Reciprocal Movements UBE x 6 mins level 1 for conditioning, min v.c. for speed (pt maintained 23-30rpms)   Functional Reaching Activities   Mid Level in sitting, functional reaching forward and in ER with trunk rotation and lateral wt. shift to flip cards with finger ext, and supination with min cues for big movements   High Level in sitting:  with trunk rotation/lateral wt. shift and PWR! reach across body and laterally in diagonal patterns (mat>overhead, floor>overhead) with min cues for big movements and PWR! hands to grasp cylinder objects           [facility]) - 06/20/14 1543    PWR! exercises Moves in quadraped  modified [place]   PWR! Rock x10   PWR! Twist x10               OT Short Term Goals - 06/20/14 1549    OT SHORT TERM GOAL #1  Title I with inital HEP.   Baseline ck 05/03/14   Time 4   Period Weeks   Status Achieved   OT SHORT TERM GOAL #2   Title Pt / family will verbalize understanding of adapted strategies for ADLS/ IADLS.   Time 4   Period Weeks   Status Achieved   OT SHORT TERM GOAL #3   Title Pt will demonstrate improved indpendence with dressing as evidenced by decreasing PPT #4 to 45 secs or less.   Baseline 05/28/14-36.79 secs   Time 4   Period Weeks   Status Achieved  not met 04/29/14:  53.66sec   OT SHORT TERM GOAL #4   Title Pt will demonstrate ability to write 4 sentences with 100 % legibility and minimal decrease in letter size.   Baseline 100% legibility and no significant decrease in size, 05/28/14   Time 4   Period Weeks   Status Achieved  Not met 04/29/14--95% legibility and min-mod decrease in size   OT SHORT TERM GOAL #5   Title Upgraded goal- Pt will demonstrate improved  indpendence with dressing as evidenced by performing PPT #4 in 30 secs without LOB.   Time 4   Period Weeks   Status New   OT SHORT TERM GOAL #6   Title Pt will perform simple stove top cooking task with close supervision.   Baseline due 06/27/14   Time 4   Period Weeks   Status New           OT Long Term Goals - 05/28/14 1113    OT LONG TERM GOAL #1   Title Pt will perfom dressing with supervision/set up.   Baseline ck 06/01/14   Time 8   Period Weeks   Status Achieved   OT LONG TERM GOAL #2   Title Pt will perform light home management/ snack prep with min A.   Baseline pt makes her own sausage biscuit and clleans out sink.   Time 8   Period Weeks   Status Achieved   OT LONG TERM GOAL #3   Title Pt will demonstrate improved standing balance for ADLS as evidenced by increasing standing functional reach by 3 inches bilaterally.(see below for revised goal on 05/28/14)   Baseline 05/28/14-RUE 8.5, LUE 9.25- met for LUE , RUE ongoing, revised goal : increase RUE standing functional reach to 9.5 inches for increased standing balance for ADLs.   Time 8   Period Weeks   Status On-going   OT LONG TERM GOAL #4   Title Pt will demonstrate increased ease with feeding as evidenced by decreasing PPT #2 to 10 secs or less.   Baseline 12.91 secs- 05/28/14, not fully met, continue goal   Time 8   Period Weeks   Status On-going   OT LONG TERM GOAL #5   Title Pt/ family will verbalize understanding of compensatory strategies for cognition and ways to keep thinking skills sharp   Baseline handout issued 05/24/14   Time 8   Period Weeks   Status Achieved   Long Term Additional Goals   Additional Long Term Goals Yes   OT LONG TERM GOAL #6   Title Pt will perform basic cooking/ home management in standing at a modified independent level   Time 8   Period Weeks   Status New               Plan - 06/20/14 1544    Clinical Impression Statement Pt is progressing towards goals and  has  helped with a cooking task at home (fried chicken on stove, but husband washed and cut chicken)   Plan reinforce large amplitude movements   Consulted and Agree with Plan of Care Patient;Family member/caregiver   Family Member Consulted husband        Problem List Patient Active Problem List   Diagnosis Date Noted  . Encephalopathy, hypertensive 05/29/2014  . Dizziness of unknown cause 02/21/2014  . Left ventricular diastolic dysfunction with preserved systolic function 92/01/9416  . Essential hypertension 12/30/2013  . Transaminitis 12/24/2013  . Palpitations 12/23/2013  . Respiratory insufficiency 12/22/2013  . Normocytic anemia 12/21/2013  . Thrombocytopenia 12/21/2013  . Abnormal ECG 12/15/2013  . Abnormal involuntary movement 12/15/2013  . Colorectal polyps 12/15/2013  . Chest pain 12/15/2013  . Arteriosclerosis of coronary artery 12/15/2013  . Chronic kidney disease (CKD), stage III (moderate) 12/15/2013  . Corn 12/15/2013  . Diabetes 12/15/2013  . Breathing difficult 12/15/2013  . Acid reflux 12/15/2013  . HLD (hyperlipidemia) 12/15/2013  . BP (high blood pressure) 12/15/2013  . FOM (frequency of micturition) 12/15/2013  . Breast cancer, female 12/15/2013  . Fungal infection of nail 12/15/2013  . Osteopenia 12/15/2013  . Raynaud's syndrome 12/15/2013  . Restless leg 12/15/2013  . Gougerout-Sjoegren syndrome 12/15/2013  . Change in blood platelet count 12/15/2013  . Infection of urinary tract 12/15/2013  . Lower urinary tract infection 10/12/2013  . Idiopathic Parkinson's disease 09/06/2013  . Bladder spasm 09/05/2013  . Urgency of micturation 09/05/2013  . Dyspnea 06/15/2012  . Hyperglycemia 12/27/2011  . Sjogren's syndrome 12/17/2010  . TIA (transient ischemic attack) 12/17/2010  . Benign hypertensive heart disease without heart failure 09/01/2010  . Parkinson's disease 09/01/2010  . Hypothyroidism 09/01/2010  . Hypercholesterolemia 09/01/2010  . Chronic  anemia 09/01/2010    [facility] 06/20/2014, 4:40 PM  [facility] [address] [place], [place], [address] Phone: [phone]   Fax:  [place], OTR/L 06/20/2014 4:40 PM

## 2014-06-20 NOTE — Patient Instructions (Signed)
Continue loud /a/ and sentences

## 2014-06-20 NOTE — Therapy (Signed)
[facility] [address] [place], [place], [address] Phone: [phone]   Fax:  [phone]  Physical Therapy Treatment  Patient Details  Name: Victoria Lindsey MRN: [id number] Date of Birth: November 16, 1925 Referring Provider:  [name], DO  Encounter Date: 06/20/2014      PT End of Session - 06/20/14 1513    Visit Number 22   Number of Visits 24   Date for PT Re-Evaluation 07/04/14   PT Start Time 4967   PT Stop Time 1445   PT Time Calculation (min) 42 min   Equipment Utilized During Treatment Gait belt   Activity Tolerance Patient tolerated treatment well   Behavior During Therapy [facility] for tasks assessed/performed      Past Medical History  Diagnosis Date  . Hypertension   . Parkinson's disease   . Hyperlipidemia   . Dizziness   . Orthostatic hypotension   . Anemia   . Tremor   . Breast cancer     LEFT BREAST  . Kidney failure     stage 3  . Hypothyroidism   . Prediabetes   . TIA (transient ischemic attack)   . Hyperglycemia   . Left ventricular diastolic dysfunction   . Hypophonia     Past Surgical History  Procedure Laterality Date  . Cardiovascular stress test  06/2007    NORMAL  . Breast lumpectomy  2010  . Cholecystectomy  1994  . Tonsillectomy    . Nasal sinus surgery      submucous resection late 1950s  . Cataract extraction  2005,2006    Dr.Devonzo    There were no vitals filed for this visit.  Visit Diagnosis:  Abnormality of gait  Posture abnormality  Decreased coordination      Subjective Assessment - 06/20/14 1408    Subjective Cooked a meal last night.  Denies pain or falls.   Patient Stated Goals Pt's goals are to stand straighter and to be able to do more things around house, to improve balance.  She wants to continue to live at home safely.   Currently in Pain? No/denies                       [facility] Adult PT Treatment/Exercise - 06/20/14 1509    Transfers   Transfers Sit to Stand;Stand to Sit   Sit to Stand 5: Supervision;With upper extremity assist;With armrests;From chair/3-in-1   Sit to Stand Details (indicate cue type and reason) repeated x 10 to full stand then partial sit to stand x 10   Stand to Sit 5: Supervision;With upper extremity assist;With armrests;To chair/3-in-1   Stand to Sit Details cues to control descent   Ambulation/Gait   Ambulation/Gait Yes   Ambulation/Gait Assistance 4: Min guard   Ambulation Distance (Feet) 200 Feet  100 twice   Assistive device 4-wheeled walker   Gait Pattern Narrow base of support;Decreased step length - right;Decreased step length - left   Ambulation Surface Level;Indoor   Posture/Postural Control   Posture/Postural Control Postural limitations   Postural Limitations Posterior pelvic tilt   Posture Comments seated on green therapy ball for static sitting, bouncing, anterior/posterior and lateral movement, alternating UE raises, marching-pt needing min assist to maintain balance at times            [facility]) - 06/20/14 1512    Step and Reach Forward Other reps (comment)  with UE assist of counter x 20   Step and  Reach Backward Other reps (comment)  with UE assist of counter x 20   Step and Reach Sideways Other reps (comment)  20 reps with UE assist of counter   Rock and Reach Forward/Backward Other reps (comment)  20 reps with counter and chair for UE support              PT Short Term Goals - 05/06/14 1256    PT SHORT TERM GOAL #1   Title Pt will perform HEP with supervision for improved strength, balance, and gait. (Target date 05/03/14)   Status Not Met   PT SHORT TERM GOAL #2   Title Pt will improve Berg Balance score to at least 25/56 for decreased fall risk.   Status Achieved   PT SHORT TERM GOAL #3   Title Pt will improve TUG score to less than or equal to 30 seconds for improved ADL participation in home.   Status Achieved   PT SHORT TERM GOAL #4    Title Pt will perform at least 8 of 10 reps of sit<>stand with minimal UE support from <18 inch surfaces, for improved transfer efficiency and safety.   Status Not Met           PT Long Term Goals - 05/30/14 1028    PT LONG TERM GOAL #1   Title Pt will verbalize plans for continued community fitness upon D/C from PT   Time 4   Status New   PT LONG TERM GOAL #2   Title Pt will improve Berg Balance score to at least 37/56 for decreased fall risk.   Time 4   Period Weeks   Status New   PT LONG TERM GOAL #3   Title Pt will improve TUG score to less than or equal to 25 seconds for decreased fall risk.   Time 4   Period Weeks   Status New   PT LONG TERM GOAL #4   Title Pt will improve gait velocity to at least 2 ft/sec for improved gait efficiency and safety.   Time 4   Period Weeks   Status New               Plan - 06/20/14 1514    Clinical Impression Statement Pt with one episode of LOB at counter with stepping.  Continues with posterior pushing at  times with transfers.  Continue PT per POC.   Pt will benefit from skilled therapeutic intervention in order to improve on the following deficits Abnormal gait;Decreased activity tolerance;Decreased balance;Decreased mobility;Decreased strength;Difficulty walking;Postural dysfunction   Rehab Potential Good   PT Frequency 2x / week   PT Duration 4 weeks  wk 3 of 4   PT Treatment/Interventions ADLs/Self Care Home Management;Gait training;Stair training;Functional mobility training;Neuromuscular re-education;Balance training;Therapeutic exercise;Therapeutic activities;Patient/family education   PT Next Visit Plan work on core stability and strengthening of lumbar/hip area   Consulted and Agree with Plan of Care Patient;Family member/caregiver   Family Member Consulted husband        Problem List Patient Active Problem List   Diagnosis Date Noted  . Encephalopathy, hypertensive 05/29/2014  . Dizziness of unknown cause  02/21/2014  . Left ventricular diastolic dysfunction with preserved systolic function 56/38/7564  . Essential hypertension 12/30/2013  . Transaminitis 12/24/2013  . Palpitations 12/23/2013  . Respiratory insufficiency 12/22/2013  . Normocytic anemia 12/21/2013  . Thrombocytopenia 12/21/2013  . Abnormal ECG 12/15/2013  . Abnormal involuntary movement 12/15/2013  . Colorectal polyps 12/15/2013  . Chest pain 12/15/2013  .  Arteriosclerosis of coronary artery 12/15/2013  . Chronic kidney disease (CKD), stage III (moderate) 12/15/2013  . Corn 12/15/2013  . Diabetes 12/15/2013  . Breathing difficult 12/15/2013  . Acid reflux 12/15/2013  . HLD (hyperlipidemia) 12/15/2013  . BP (high blood pressure) 12/15/2013  . FOM (frequency of micturition) 12/15/2013  . Breast cancer, female 12/15/2013  . Fungal infection of nail 12/15/2013  . Osteopenia 12/15/2013  . Raynaud's syndrome 12/15/2013  . Restless leg 12/15/2013  . Gougerout-Sjoegren syndrome 12/15/2013  . Change in blood platelet count 12/15/2013  . Infection of urinary tract 12/15/2013  . Lower urinary tract infection 10/12/2013  . Idiopathic Parkinson's disease 09/06/2013  . Bladder spasm 09/05/2013  . Urgency of micturation 09/05/2013  . Dyspnea 06/15/2012  . Hyperglycemia 12/27/2011  . Sjogren's syndrome 12/17/2010  . TIA (transient ischemic attack) 12/17/2010  . Benign hypertensive heart disease without heart failure 09/01/2010  . Parkinson's disease 09/01/2010  . Hypothyroidism 09/01/2010  . Hypercholesterolemia 09/01/2010  . Chronic anemia 09/01/2010    Narda Bonds 06/20/2014, 3:17 PM  [facility] [address] [place], [place], [address] Phone: [phone]   Fax:  [place], [place] 06/20/2014 3:17 PM Phone: [phone] Fax: [phone]

## 2014-06-20 NOTE — Therapy (Signed)
[facility] [address] [place], [place], [address] Phone: [phone]   Fax:  [phone]  Speech Language Pathology Treatment  Patient Details  Name: Victoria Lindsey MRN: [id number] Date of Birth: [birth year] Referring Provider:  [name], DO  Encounter Date: 06/20/2014      End of Session - 06/20/14 1358    Visit Number 14   Number of Visits 16   Date for SLP Re-Evaluation 07/07/14   SLP Start Time 9   SLP Stop Time  1358   SLP Time Calculation (min) 40 min   Activity Tolerance Patient tolerated treatment well      Past Medical History  Diagnosis Date  . Hypertension   . Parkinson's disease   . Hyperlipidemia   . Dizziness   . Orthostatic hypotension   . Anemia   . Tremor   . Breast cancer     LEFT BREAST  . Kidney failure     stage 3  . Hypothyroidism   . Prediabetes   . TIA (transient ischemic attack)   . Hyperglycemia   . Left ventricular diastolic dysfunction   . Hypophonia     Past Surgical History  Procedure Laterality Date  . Cardiovascular stress test  06/2007    NORMAL  . Breast lumpectomy  2010  . Cholecystectomy  1994  . Tonsillectomy    . Nasal sinus surgery      submucous resection late 1950s  . Cataract extraction  2005,2006    Dr.Devonzo    There were no vitals filed for this visit.  Visit Diagnosis: Hypokinetic Parkinsonian dysphonia      Subjective Assessment - 06/20/14 1324    Subjective Spouse attended tx today               ADULT SLP TREATMENT - 06/20/14 1324    General Information   Behavior/Cognition Alert;Cooperative;Pleasant mood   Pain Assessment   Pain Assessment 0-10   Pain Score 1    Pain Location lower back   Pain Descriptors / Indicators Aching   Pain Intervention(s) Monitored during session   Cognitive-Linquistic Treatment   Treatment focused on Dysarthria   Skilled Treatment Loud /a/ with average of 85dB over 8 seconds and  occassional visual cues for loudness.  Orally read 8-9 word sentences with average  73dB with rare min A. Pt with  pitch breaks and intermittent aphonia. Generate sentence with given word  with   average  of 70dB with occassional min verbal and visual cues to think shout, breathe 1st and feel efffortful.  Structured speech tasks  - describing pictures  average of  70db with usual min visual and verbal cues. Simple conversation average of 68dB with min to mod A.     Assessment / Recommendations / Plan   Plan Continue with current plan of care   Progression Toward Goals   Progression toward goals Progressing toward goals          SLP Education - 06/20/14 1357    Education provided Yes   Education Details Big breath, think shout   Person(s) Educated Patient   Methods Explanation;Demonstration;Handout   Comprehension Verbalized understanding          SLP Short Term Goals - 06/20/14 1359    SLP SHORT TERM GOAL #1   Title pt will maintain average of loud /a/ at 80dB for 5 sessions   Status Not Met  cues needed   SLP SHORT TERM GOAL #2  Title pt will produce 16/20 sentences with average 70dB   Status Not Met   SLP SHORT TERM GOAL #3   Title pt will improve loudness in 5 minutes simple conversation to 70dB with rare min A   Status Not Met          SLP Long Term Goals - 06/20/14 1400    SLP LONG TERM GOAL #1   Title pt will maintain average loudness of 80dB with loud /a/ over 7 sessions with rare min A   Time 3   Period Weeks   Status Revised   SLP LONG TERM GOAL #2   Title pt will improve loudness to 70dB in 5 minutes simple/mod complex conversation with occasional min A   Time 3   Period Weeks   Status Revised   SLP LONG TERM GOAL #3   Title pt will report feeling abdominal movement necessary for louder speech in simple conversation with occasional min A   Time 3   Period Weeks   Status Revised          Plan - 06/20/14 1358    Clinical Impression Statement Pt  requried usual min to mod A for breath and loud spech in sentences and structures speech. Continue ST to maximize intelligiblity.   Speech Therapy Frequency 2x / week   Treatment/Interventions Internal/external aids;Functional tasks;Cueing hierarchy;Patient/family education;Compensatory strategies;SLP instruction and feedback   Potential to Achieve Goals Good   Potential Considerations Ability to learn/carryover information;Severity of impairments        Problem List Patient Active Problem List   Diagnosis Date Noted  . Encephalopathy, hypertensive 05/29/2014  . Dizziness of unknown cause 02/21/2014  . Left ventricular diastolic dysfunction with preserved systolic function 60/06/5995  . Essential hypertension 12/30/2013  . Transaminitis 12/24/2013  . Palpitations 12/23/2013  . Respiratory insufficiency 12/22/2013  . Normocytic anemia 12/21/2013  . Thrombocytopenia 12/21/2013  . Abnormal ECG 12/15/2013  . Abnormal involuntary movement 12/15/2013  . Colorectal polyps 12/15/2013  . Chest pain 12/15/2013  . Arteriosclerosis of coronary artery 12/15/2013  . Chronic kidney disease (CKD), stage III (moderate) 12/15/2013  . Corn 12/15/2013  . Diabetes 12/15/2013  . Breathing difficult 12/15/2013  . Acid reflux 12/15/2013  . HLD (hyperlipidemia) 12/15/2013  . BP (high blood pressure) 12/15/2013  . FOM (frequency of micturition) 12/15/2013  . Breast cancer, female 12/15/2013  . Fungal infection of nail 12/15/2013  . Osteopenia 12/15/2013  . Raynaud's syndrome 12/15/2013  . Restless leg 12/15/2013  . Gougerout-Sjoegren syndrome 12/15/2013  . Change in blood platelet count 12/15/2013  . Infection of urinary tract 12/15/2013  . Lower urinary tract infection 10/12/2013  . Idiopathic Parkinson's disease 09/06/2013  . Bladder spasm 09/05/2013  . Urgency of micturation 09/05/2013  . Dyspnea 06/15/2012  . Hyperglycemia 12/27/2011  . Sjogren's syndrome 12/17/2010  . TIA (transient  ischemic attack) 12/17/2010  . Benign hypertensive heart disease without heart failure 09/01/2010  . Parkinson's disease 09/01/2010  . Hypothyroidism 09/01/2010  . Hypercholesterolemia 09/01/2010  . Chronic anemia 09/01/2010    Chassie Pennix, Annye Rusk, SLP 06/20/2014, 2:00 PM  [facility] [address] [place], [place], [address] Phone: [phone]   Fax:  [phone]

## 2014-06-25 ENCOUNTER — Ambulatory Visit: Payer: Medicare Other | Admitting: Occupational Therapy

## 2014-06-25 ENCOUNTER — Ambulatory Visit: Payer: Medicare Other | Admitting: Physical Therapy

## 2014-06-25 ENCOUNTER — Ambulatory Visit: Payer: Medicare Other

## 2014-06-25 DIAGNOSIS — R269 Unspecified abnormalities of gait and mobility: Secondary | ICD-10-CM

## 2014-06-25 DIAGNOSIS — R258 Other abnormal involuntary movements: Secondary | ICD-10-CM

## 2014-06-25 DIAGNOSIS — R279 Unspecified lack of coordination: Principal | ICD-10-CM

## 2014-06-25 DIAGNOSIS — R278 Other lack of coordination: Secondary | ICD-10-CM

## 2014-06-25 DIAGNOSIS — R490 Dysphonia: Secondary | ICD-10-CM

## 2014-06-25 DIAGNOSIS — G20 Parkinson's disease: Secondary | ICD-10-CM

## 2014-06-25 DIAGNOSIS — R29898 Other symptoms and signs involving the musculoskeletal system: Secondary | ICD-10-CM

## 2014-06-25 DIAGNOSIS — R531 Weakness: Secondary | ICD-10-CM

## 2014-06-25 NOTE — Patient Instructions (Signed)
  Please complete the assigned speech therapy homework prior to to your next session.

## 2014-06-25 NOTE — Therapy (Signed)
[facility] [address] [place], [place], [address] Phone: [phone]   Fax:  [phone]  Speech Language Pathology Treatment  Patient Details  Name: Victoria Lindsey MRN: [id number] Date of Birth: Sep 17, 1925 Referring Provider:  [name], DO  Encounter Date: 06/25/2014      End of Session - 06/25/14 1147    Visit Number 15   Number of Visits 16   Date for SLP Re-Evaluation 07/07/14   SLP Start Time 1105   SLP Stop Time  1146   SLP Time Calculation (min) 41 min   Activity Tolerance Patient tolerated treatment well      Past Medical History  Diagnosis Date  . Hypertension   . Parkinson's disease   . Hyperlipidemia   . Dizziness   . Orthostatic hypotension   . Anemia   . Tremor   . Breast cancer     LEFT BREAST  . Kidney failure     stage 3  . Hypothyroidism   . Prediabetes   . TIA (transient ischemic attack)   . Hyperglycemia   . Left ventricular diastolic dysfunction   . Hypophonia     Past Surgical History  Procedure Laterality Date  . Cardiovascular stress test  06/2007    NORMAL  . Breast lumpectomy  2010  . Cholecystectomy  1994  . Tonsillectomy    . Nasal sinus surgery      submucous resection late 1950s  . Cataract extraction  2005,2006    Dr.Devonzo    There were no vitals filed for this visit.  Visit Diagnosis: Hypokinetic Parkinsonian dysphonia      Subjective Assessment - 06/25/14 1124    Subjective Pt tells SLP relatives have commented somewhat they can hear her a bit easier on the phone. "You have to consider my age - I forget." (pt, re: beginning each utterance with adequate breath)               ADULT SLP TREATMENT - 06/25/14 1108    General Information   Behavior/Cognition Alert;Cooperative;Pleasant mood   Treatment Provided   Treatment provided Cognitive-Linquistic   Pain Assessment   Pain Assessment No/denies pain   Cognitive-Linquistic Treatment    Treatment focused on Dysarthria   Skilled Treatment Loud /a/ average 83dB with usual min-mod A for loudness. Simple conversation req'd usual min-mod A for loudness. Sentence level responses completed with usual min-mod A for loudness >/= 69dB. SLP facilitated work with pt taking adequate breath with abdominal breathing. After 7 minutes pt's success had not changed. SLP req'd to use consistent min A for pt to use adequate breath, faded to usual min A for breathing by end of task.   Assessment / Recommendations / Plan   Plan Continue with current plan of care   Progression Toward Goals   Progression toward goals --  discharge next session          SLP Education - 06/25/14 1146    Education provided Yes   Education Details adequate breathing   Person(s) Educated Patient   Methods Explanation;Demonstration;Verbal cues   Comprehension Verbalized understanding;Verbal cues required          SLP Short Term Goals - 06/20/14 1359    SLP SHORT TERM GOAL #1   Title pt will maintain average of loud /a/ at 80dB for 5 sessions   Status Not Met  cues needed   SLP SHORT TERM GOAL #2   Title pt will produce 16/20 sentences  with average 70dB   Status Not Met   SLP SHORT TERM GOAL #3   Title pt will improve loudness in 5 minutes simple conversation to 70dB with rare min A   Status Not Met          SLP Long Term Goals - 06/25/14 1148    SLP LONG TERM GOAL #1   Title pt will maintain average loudness of 80dB with loud /a/ over 7 sessions with rare min A   Time 1   Period Weeks   Status Achieved   SLP LONG TERM GOAL #2   Title pt will improve loudness to 70dB in 5 minutes simple/mod complex conversation with occasional min A   Time 1   Period Weeks   Status Revised   SLP LONG TERM GOAL #3   Title pt will report feeling abdominal movement necessary for louder speech in simple conversation with occasional min A   Time 1   Period Weeks   Status Revised          Plan - 06/25/14  1147    Clinical Impression Statement Pt cont to require cues for breathing and for louder speech in sentences and especially in conversational speech. Discharge next session.   Speech Therapy Frequency 2x / week   Duration 1 week   Treatment/Interventions Internal/external aids;Functional tasks;Cueing hierarchy;Patient/family education;Compensatory strategies;SLP instruction and feedback   Potential to Achieve Goals Good   Potential Considerations Ability to learn/carryover information;Severity of impairments        Problem List Patient Active Problem List   Diagnosis Date Noted  . Encephalopathy, hypertensive 05/29/2014  . Dizziness of unknown cause 02/21/2014  . Left ventricular diastolic dysfunction with preserved systolic function 55/03/5866  . Essential hypertension 12/30/2013  . Transaminitis 12/24/2013  . Palpitations 12/23/2013  . Respiratory insufficiency 12/22/2013  . Normocytic anemia 12/21/2013  . Thrombocytopenia 12/21/2013  . Abnormal ECG 12/15/2013  . Abnormal involuntary movement 12/15/2013  . Colorectal polyps 12/15/2013  . Chest pain 12/15/2013  . Arteriosclerosis of coronary artery 12/15/2013  . Chronic kidney disease (CKD), stage III (moderate) 12/15/2013  . Corn 12/15/2013  . Diabetes 12/15/2013  . Breathing difficult 12/15/2013  . Acid reflux 12/15/2013  . HLD (hyperlipidemia) 12/15/2013  . BP (high blood pressure) 12/15/2013  . FOM (frequency of micturition) 12/15/2013  . Breast cancer, female 12/15/2013  . Fungal infection of nail 12/15/2013  . Osteopenia 12/15/2013  . Raynaud's syndrome 12/15/2013  . Restless leg 12/15/2013  . Gougerout-Sjoegren syndrome 12/15/2013  . Change in blood platelet count 12/15/2013  . Infection of urinary tract 12/15/2013  . Lower urinary tract infection 10/12/2013  . Idiopathic Parkinson's disease 09/06/2013  . Bladder spasm 09/05/2013  . Urgency of micturation 09/05/2013  . Dyspnea 06/15/2012  . Hyperglycemia  12/27/2011  . Sjogren's syndrome 12/17/2010  . TIA (transient ischemic attack) 12/17/2010  . Benign hypertensive heart disease without heart failure 09/01/2010  . Parkinson's disease 09/01/2010  . Hypothyroidism 09/01/2010  . Hypercholesterolemia 09/01/2010  . Chronic anemia 09/01/2010    Garald Balding, SLP 06/25/2014, 11:50 AM  [facility] [address] [place], [place], [address] Phone: [phone]   Fax:  [phone]

## 2014-06-25 NOTE — Therapy (Signed)
[facility] [address] [place], [place], [address] Phone: [phone]   Fax:  [phone]  Occupational Therapy Treatment  Patient Details  Name: Victoria Lindsey MRN: [id number] Date of Birth: 1925/08/16 Referring Provider:  [name], DO  Encounter Date: 06/25/2014      OT End of Session - 06/25/14 1545    Visit Number 23   Number of Visits 24   Date for OT Re-Evaluation 06/27/14   Authorization Type Medicare U[employer]   Authorization Time Period G code needed, renewed 05/28/14          Authorization - Visit Number 23   Authorization - Number of Visits 30   OT Start Time 1155   OT Stop Time 1230   OT Time Calculation (min) 35 min   Activity Tolerance Patient tolerated treatment well   Behavior During Therapy [facility] for tasks assessed/performed      Past Medical History  Diagnosis Date  . Hypertension   . Parkinson's disease   . Hyperlipidemia   . Dizziness   . Orthostatic hypotension   . Anemia   . Tremor   . Breast cancer     LEFT BREAST  . Kidney failure     stage 3  . Hypothyroidism   . Prediabetes   . TIA (transient ischemic attack)   . Hyperglycemia   . Left ventricular diastolic dysfunction   . Hypophonia     Past Surgical History  Procedure Laterality Date  . Cardiovascular stress test  06/2007    NORMAL  . Breast lumpectomy  2010  . Cholecystectomy  1994  . Tonsillectomy    . Nasal sinus surgery      submucous resection late 1950s  . Cataract extraction  2005,2006    Dr.Devonzo    There were no vitals filed for this visit.  Visit Diagnosis:  Decreased coordination  Bradykinesia  Rigidity  Weakness      Subjective Assessment - 06/25/14 1553    Currently in Pain? Yes   Pain Location Hip   Pain Orientation Right   Pain Descriptors / Indicators Burning   Pain Onset In the past 7 days   Pain Frequency Intermittent   Aggravating Factors  unsure   Pain Relieving Factors  gets better on its own   Multiple Pain Sites No          Treatment: therapist started checking progress towards goals. (see long and short term goals for progress)  Dynamic step and reach with bilateral UE's to place large pegs in pegboard, min v.c. for large amplitude movements, close supervision for balance.                      OT Short Term Goals - 06/25/14 1156    OT SHORT TERM GOAL #1   Title I with inital HEP.   Baseline ck 05/03/14   Time 4   Period Weeks   Status Achieved   OT SHORT TERM GOAL #2   Title Pt / family will verbalize understanding of adapted strategies for ADLS/ IADLS.   Time 4   Period Weeks   Status Achieved   OT SHORT TERM GOAL #3   Title Pt will demonstrate improved indpendence with dressing as evidenced by decreasing PPT #4 to 45 secs or less.   Baseline 05/28/14-36.79 secs   Time 4   Period Weeks   Status Achieved  not met 04/29/14:  53.66sec   OT SHORT TERM  GOAL #4   Title Pt will demonstrate ability to write 4 sentences with 100 % legibility and minimal decrease in letter size.   Baseline 100% legibility and no significant decrease in size, 05/28/14   Time 4   Period Weeks   Status Achieved  Not met 04/29/14--95% legibility and min-mod decrease in size   OT SHORT TERM GOAL #5   Title Upgraded goal- Pt will demonstrate improved indpendence with dressing as evidenced by performing PPT #4 in 30 secs without LOB.   Baseline 30.5 secs   Time 4   Period Weeks   Status Partially Met   OT SHORT TERM GOAL #6   Title Pt will perform simple stove top cooking task with close supervision.   Baseline 06/25/14- pt performs with min A currently, has not performed with supervision at home.   Time 4   Period Weeks   Status Partially Met           OT Long Term Goals - 06/25/14 1206    OT LONG TERM GOAL #1   Title Pt will perfom dressing with supervision/set up.   Baseline ck 06/01/14   Time 8   Period Weeks   Status Achieved   OT  LONG TERM GOAL #2   Title Pt will perform light home management/ snack prep with min A.   Baseline pt makes her own sausage biscuit and clleans out sink.   Time 8   Period Weeks   Status Achieved   OT LONG TERM GOAL #3   Title Pt will demonstrate improved standing balance for ADLS as evidenced by increasing standing functional reach by 3 inches bilaterally.(see below for revised goal on 05/28/14)   Baseline not met, RUE 9 inches, LUE 8.5 inches, improved but not fully met   Time 8   Period Weeks   Status Not Met   OT LONG TERM GOAL #4   Title Pt will demonstrate increased ease with feeding as evidenced by decreasing PPT #2 to 10 secs or less.   Baseline 11.53 secs 06/25/14   Time 8   Period Weeks   Status Not Met   OT LONG TERM GOAL #5   Title Pt/ family will verbalize understanding of compensatory strategies for cognition and ways to keep thinking skills sharp   Baseline handout issued 05/24/14   Time 8   Period Weeks   Status Achieved   OT LONG TERM GOAL #6   Title Pt will perform basic cooking/ home management in standing at a modified independent level   Time 8   Period Weeks   Status On-going               Plan - 06/25/14 1548    Clinical Impression Statement Discussed plans with pt/ husband to d/c next visit, pt/ husband are in agreement. Pt has made good overall progress.   Pt will benefit from skilled therapeutic intervention in order to improve on the following deficits (Retired) Decreased cognition;Decreased knowledge of use of DME;Abnormal gait;Impaired flexibility;Pain;Decreased mobility;Decreased coordination;Decreased activity tolerance;Decreased endurance;Decreased range of motion;Decreased strength;Decreased balance;Decreased knowledge of precautions;Decreased safety awareness;Impaired perceived functional ability;Difficulty walking;Impaired UE functional use   Rehab Potential Good   Clinical Impairments Affecting Rehab Potential decreased strength, decreased  coordination, bradykinesia   OT Frequency 2x / week   OT Duration 8 weeks   OT Treatment/Interventions Self-care/ADL training;Fluidtherapy;DME and/or AE instruction;Splinting;Patient/family education;Balance training;Therapeutic exercises;Ultrasound;Therapeutic exercise;Therapeutic activities;Cognitive remediation/compensation;Passive range of motion;Functional Mobility Training;Neuromuscular education;Iontophoresis;Parrafin;Energy conservation;Manual Therapy;Visual/perceptual remediation/compensation   Plan finish checking  goals, d/c and g-code   OT Home Exercise Plan  HEP issued-PWR seated, coordination, bag exercises (crumple, behind head, behind back, in fron pass), ways to incorporate big movements into ADLs   Recommended Other Services Threapist recommends pt gets checked out due to possible shingles.   Consulted and Agree with Plan of Care Patient;Family member/caregiver   Family Member Consulted husband        Problem List Patient Active Problem List   Diagnosis Date Noted  . Encephalopathy, hypertensive 05/29/2014  . Dizziness of unknown cause 02/21/2014  . Left ventricular diastolic dysfunction with preserved systolic function 83/37/4451  . Essential hypertension 12/30/2013  . Transaminitis 12/24/2013  . Palpitations 12/23/2013  . Respiratory insufficiency 12/22/2013  . Normocytic anemia 12/21/2013  . Thrombocytopenia 12/21/2013  . Abnormal ECG 12/15/2013  . Abnormal involuntary movement 12/15/2013  . Colorectal polyps 12/15/2013  . Chest pain 12/15/2013  . Arteriosclerosis of coronary artery 12/15/2013  . Chronic kidney disease (CKD), stage III (moderate) 12/15/2013  . Corn 12/15/2013  . Diabetes 12/15/2013  . Breathing difficult 12/15/2013  . Acid reflux 12/15/2013  . HLD (hyperlipidemia) 12/15/2013  . BP (high blood pressure) 12/15/2013  . FOM (frequency of micturition) 12/15/2013  . Breast cancer, female 12/15/2013  . Fungal infection of nail 12/15/2013  .  Osteopenia 12/15/2013  . Raynaud's syndrome 12/15/2013  . Restless leg 12/15/2013  . Gougerout-Sjoegren syndrome 12/15/2013  . Change in blood platelet count 12/15/2013  . Infection of urinary tract 12/15/2013  . Lower urinary tract infection 10/12/2013  . Idiopathic Parkinson's disease 09/06/2013  . Bladder spasm 09/05/2013  . Urgency of micturation 09/05/2013  . Dyspnea 06/15/2012  . Hyperglycemia 12/27/2011  . Sjogren's syndrome 12/17/2010  . TIA (transient ischemic attack) 12/17/2010  . Benign hypertensive heart disease without heart failure 09/01/2010  . Parkinson's disease 09/01/2010  . Hypothyroidism 09/01/2010  . Hypercholesterolemia 09/01/2010  . Chronic anemia 09/01/2010    Sherilyn Windhorst 06/25/2014, 3:54 PM Theone Murdoch, OTR/L [phone] Phone: [phone] 3:54 PM 06/25/2014 [facility] [address] [place], [place], [address] Phone: [phone]   Fax:  [phone]

## 2014-06-25 NOTE — Therapy (Signed)
[facility] [address] [place], [place], [address] Phone: [phone]   Fax:  [phone]  Physical Therapy Treatment  Patient Details  Name: Victoria Lindsey MRN: [id number] Date of Birth: [birth year] Referring Provider:  [name], DO  Encounter Date: 06/25/2014      PT End of Session - 06/26/14 2221    Visit Number 23   Number of Visits 24   Date for PT Re-Evaluation 07/04/14   PT Start Time 1018   PT Stop Time 1100   PT Time Calculation (min) 42 min   Equipment Utilized During Treatment Gait belt   Activity Tolerance Patient tolerated treatment well   Behavior During Therapy [facility] for tasks assessed/performed      Past Medical History  Diagnosis Date  . Hypertension   . Parkinson's disease   . Hyperlipidemia   . Dizziness   . Orthostatic hypotension   . Anemia   . Tremor   . Breast cancer     LEFT BREAST  . Kidney failure     stage 3  . Hypothyroidism   . Prediabetes   . TIA (transient ischemic attack)   . Hyperglycemia   . Left ventricular diastolic dysfunction   . Hypophonia     Past Surgical History  Procedure Laterality Date  . Cardiovascular stress test  06/2007    NORMAL  . Breast lumpectomy  2010  . Cholecystectomy  1994  . Tonsillectomy    . Nasal sinus surgery      submucous resection late 1950s  . Cataract extraction  2005,2006    Dr.Devonzo    There were no vitals filed for this visit.  Visit Diagnosis:  Bradykinesia  Abnormality of gait      Subjective Assessment - 06/26/14 2211    Subjective No falls over the weekend   Currently in Pain? Yes   Pain Score 1    Pain Location Hip   Pain Orientation Right   Pain Descriptors / Indicators Burning  itching, tingling   Pain Type Acute pain   Pain Onset In the past 7 days   Pain Frequency Intermittent   Aggravating Factors  unsure   Pain Relieving Factors gets better on its own       Discussed with patient  newer onset c/o different pain -itching, tingling, burning.  Inspected skin on R posterior hip/buttock area where pt reports she notices the pain.  One small area of reddened skin is present.  Discussed with patient symptoms sound similar to symptoms of shingles and advised patient to contact physician to let her know of new onset pain, burning, itching, tingling sensations around R posterior hip.  Also discussed plan of care, including plans for discharge next visit.  Neuro Re-education:  Reviewed patients recent HEP-forward and back weightshifting with stagger stance x 10 reps 2 sets, with UE support, then lateral weightshifting 2 sets x 10 reps with cues for reach and hold for improved single limb stance.     [facility] PT Assessment - 06/26/14 2216    Ambulation/Gait   Gait velocity 2.05 ft/sec   Timed Up and Go Test   Normal TUG (seconds) 24.84                     [facility] Adult PT Treatment/Exercise - 06/26/14 2216    Ambulation/Gait   Ambulation Distance (Feet) 500 Feet  5 minutes  PT Education - 06/26/14 2220    Education provided Yes   Education Details need to contact physician regarding new onset/difference R hip pain; POC with plans for discharge next visit; plans to get back to [facility] for continued community fitness/walking program   Person(s) Educated Patient   Methods Explanation   Comprehension Verbalized understanding          PT Short Term Goals - 05/06/14 1256    PT SHORT TERM GOAL #1   Title Pt will perform HEP with supervision for improved strength, balance, and gait. (Target date 05/03/14)   Status Not Met   PT SHORT TERM GOAL #2   Title Pt will improve Berg Balance score to at least 25/56 for decreased fall risk.   Status Achieved   PT SHORT TERM GOAL #3   Title Pt will improve TUG score to less than or equal to 30 seconds for improved ADL participation in home.   Status Achieved   PT SHORT TERM GOAL #4   Title Pt will perform at  least 8 of 10 reps of sit<>stand with minimal UE support from <18 inch surfaces, for improved transfer efficiency and safety.   Status Not Met           PT Long Term Goals - 06/26/14 2217    PT LONG TERM GOAL #1   Title Pt will verbalize plans for continued community fitness upon D/C from PT   Status Achieved   PT LONG TERM GOAL #2   Title Pt will improve Berg Balance score to at least 37/56 for decreased fall risk.   Status New   PT LONG TERM GOAL #3   Title Pt will improve TUG score to less than or equal to 25 seconds for decreased fall risk.   Baseline 06/25/14-24.84 sec   Status New   PT LONG TERM GOAL #4   Title Pt will improve gait velocity to at least 2 ft/sec for improved gait efficiency and safety.   Baseline 2.05 ft/sec 06/25/14   Status New   PT LONG TERM GOAL #5   Title --   Status --               Plan - 06/26/14 2221    Clinical Impression Statement Pt has met LTG #1, 3, 4.  Remaining LTGS to be assessed next visit, with plans for discharge.     Pt will benefit from skilled therapeutic intervention in order to improve on the following deficits Abnormal gait;Decreased activity tolerance;Decreased balance;Decreased mobility;Decreased strength;Difficulty walking;Postural dysfunction   Rehab Potential Good   PT Frequency 2x / week   PT Duration 4 weeks  wk 4 of 4   PT Treatment/Interventions ADLs/Self Care Home Management;Gait training;Stair training;Functional mobility training;Neuromuscular re-education;Balance training;Therapeutic exercise;Therapeutic activities;Patient/family education   PT Next Visit Plan Check Dannette Barbara, plan to d/c next visit   Consulted and Agree with Plan of Care Patient;Family member/caregiver   Family Member Consulted husband        Problem List Patient Active Problem List   Diagnosis Date Noted  . Folliculitis 76/54/6503  . Encephalopathy, hypertensive 05/29/2014  . Dizziness of unknown cause 02/21/2014  . Left  ventricular diastolic dysfunction with preserved systolic function 54/65/6812  . Essential hypertension 12/30/2013  . Transaminitis 12/24/2013  . Palpitations 12/23/2013  . Respiratory insufficiency 12/22/2013  . Normocytic anemia 12/21/2013  . Thrombocytopenia 12/21/2013  . Abnormal ECG 12/15/2013  . Abnormal involuntary movement 12/15/2013  . Colorectal polyps 12/15/2013  . Chest pain  12/15/2013  . Arteriosclerosis of coronary artery 12/15/2013  . Chronic kidney disease (CKD), stage III (moderate) 12/15/2013  . Corn 12/15/2013  . Diabetes 12/15/2013  . Breathing difficult 12/15/2013  . Acid reflux 12/15/2013  . HLD (hyperlipidemia) 12/15/2013  . BP (high blood pressure) 12/15/2013  . FOM (frequency of micturition) 12/15/2013  . Breast cancer, female 12/15/2013  . Fungal infection of nail 12/15/2013  . Osteopenia 12/15/2013  . Raynaud's syndrome 12/15/2013  . Restless leg 12/15/2013  . Gougerout-Sjoegren syndrome 12/15/2013  . Change in blood platelet count 12/15/2013  . Infection of urinary tract 12/15/2013  . Lower urinary tract infection 10/12/2013  . Idiopathic Parkinson's disease 09/06/2013  . Bladder spasm 09/05/2013  . Urgency of micturation 09/05/2013  . Dyspnea 06/15/2012  . Hyperglycemia 12/27/2011  . Sjogren's syndrome 12/17/2010  . TIA (transient ischemic attack) 12/17/2010  . Benign hypertensive heart disease without heart failure 09/01/2010  . Parkinson's disease 09/01/2010  . Hypothyroidism 09/01/2010  . Hypercholesterolemia 09/01/2010  . Chronic anemia 09/01/2010    MARRIOTT,AMY W. 06/26/2014, 10:24 PM  Mady Haagensen, PT 06/26/2014 10:25 PM Phone: [phone] Fax: [facility] [address] [place], [place], [address] Phone: [phone]   Fax:  [phone]

## 2014-06-26 ENCOUNTER — Ambulatory Visit (INDEPENDENT_AMBULATORY_CARE_PROVIDER_SITE_OTHER): Payer: Medicare Other | Admitting: Internal Medicine

## 2014-06-26 ENCOUNTER — Encounter: Payer: Self-pay | Admitting: Internal Medicine

## 2014-06-26 VITALS — BP 152/64 | HR 78 | Temp 97.7°F | Ht 61.0 in | Wt 116.4 lb

## 2014-06-26 DIAGNOSIS — L739 Follicular disorder, unspecified: Secondary | ICD-10-CM | POA: Diagnosis not present

## 2014-06-26 NOTE — Progress Notes (Signed)
Patient ID: Victoria Lindsey, female   DOB: [birth year], 81 y.o.   MRN: [id number]    Facility  [facility]    Place of Service:   OFFICE    Allergies  Allergen Reactions  . Aciphex [Rabeprazole Sodium]   . Amantadines   . Avapro [Irbesartan]   . Carbidopa-Levodopa   . Cardura [Doxazosin Mesylate]   . Ciprofloxacin   . Clonidine Derivatives   . Cozaar   . Diltiazem   . Famotidine   . Hctz [Hydrochlorothiazide]   . Indapamide   . Lansoprazole   . Lisinopril     Mouth problems  . Loratadine   . Maxidex [Dexamethasone]   . Mirapex [Pramipexole Dihydrochloride]   . Norvasc [Amlodipine Besylate]   . Pantoprazole Sodium   . Penicillins   . Prilosec [Omeprazole]   . Procardia [Nifedipine]   . Proton Pump Inhibitors   . Sulfa Drugs Cross Reactors   . Trimethoprim   . Zyrtec [Cetirizine Hcl]     Chief Complaint  Patient presents with  . Acute Visit    Complains of Possible Shingles on Hip/Bottom area.    HPI:  Patient's had multiple sessions with physical therapy due to her falls and parkinsonism. She felt like she was doing well. She complained of a discomfort in the right buttock area. There was a small lesion that the therapist thought might be herpes zoster, so she was referred to her primary care physician for evaluation.  Medications: Patient's Medications  New Prescriptions   No medications on file  Previous Medications   AMBULATORY NON FORMULARY MEDICATION    Outpatient Speech Therapy, evaluate and treat.  DX: G20, R49.8   ASPIRIN 81 MG TABLET    Take 81 mg by mouth every evening.    BIOTIN PO    Take 1,000 mcg by mouth daily.    CHOLECALCIFEROL (VITAMIN D3) 2000 UNITS TABS    Take 1 tablet by mouth daily.    CLOPIDOGREL (PLAVIX) 75 MG TABLET    TAKE 1 TABLET BY MOUTH EVERY DAY   CRANBERRY 500 MG CAPS    Take 500 mg by mouth daily.    DOCUSATE SODIUM (COLACE) 100 MG CAPSULE    Take 100 mg by mouth daily.   FOLIC ACID (FOLVITE) 384 MCG TABLET    Take 800 mcg by mouth  daily.    FUROSEMIDE (LASIX) 20 MG TABLET    Take 1 tablet (20 mg total) by mouth daily.   LEVOTHYROXINE (SYNTHROID, LEVOTHROID) 112 MCG TABLET    TAKE 1 TABLET BY MOUTH EVERY DAY   METOPROLOL (LOPRESSOR) 100 MG TABLET    Take 1 tablet (100 mg total) by mouth 2 (two) times daily.   MULTIPLE VITAMIN (MULTIVITAMIN WITH MINERALS) TABS TABLET    Take 1 tablet by mouth daily.   ONE TOUCH ULTRA TEST TEST STRIP       ONETOUCH DELICA LANCETS 53M MISC    by Does not apply route as directed. Use lancets to check blood sugars   PILOCARPINE (SALAGEN) 5 MG TABLET    Take 5 mg by mouth as needed.    PROBIOTIC PRODUCT (PROBIOTIC PO)    Take 1 tablet by mouth daily.   RASAGILINE (AZILECT) 1 MG TABS    Take 1 mg by mouth daily.   [place]    by Does not apply route.   ROSUVASTATIN (CRESTOR) 10 MG TABLET    Take 10 mg by mouth every evening.  ROTIGOTINE 8 MG/24HR PT24    Place 1 patch onto the skin daily.    SPIRONOLACTONE (ALDACTONE) 25 MG TABLET    Take 1 tablet (25 mg total) by mouth daily.  Modified Medications   No medications on file  Discontinued Medications   No medications on file     Review of Systems  Constitutional:       Deconditioning  HENT:       Nasal drainage and loss of smell  Eyes:       Dry eyes, itching  Respiratory: Positive for shortness of breath.   Cardiovascular:       High blood pressure  Gastrointestinal:       Dysphagia  Genitourinary: Positive for urgency.       "recurrent UTIs"  Musculoskeletal: Positive for back pain.  Skin:       One single lesion of folliculitis at the top of the right buttock. There are no blistered lesions. There is no evidence for herpes zoster or herpes simplex.  Neurological: Positive for tremors and weakness.       Loss of balance; h/o TIA  Hematological:       Diabetes, thyroid disorder, estrogen deficient; bruising and anemia    Filed Vitals:   06/26/14 1353  BP: 152/64  Pulse: 78  Temp: 97.7 F (36.5 C)   TempSrc: Oral  Height: 5\' 1"  (1.549 m)  Weight: 116 lb 6.4 oz (52.799 kg)   Body mass index is 22.01 kg/(m^2).  Physical Exam  Constitutional: She is oriented to person, place, and time. She appears well-developed and well-nourished. No distress.  HENT:  Head: Normocephalic and atraumatic.  Mouth/Throat: No oropharyngeal exudate.  Cardiovascular: Normal rate, regular rhythm, normal heart sounds and intact distal pulses.   Pulmonary/Chest: Effort normal and breath sounds normal. No respiratory distress.  Abdominal: Bowel sounds are normal.  Musculoskeletal: Normal range of motion. She exhibits no edema or tenderness.  Neurological: She is alert and oriented to person, place, and time.  Resting tremor left greater than right cogwheeling rigidity  Skin: Skin is warm and dry. There is pallor.  1 small area of folliculitis at the top of the right buttock. No evidence for herpes zoster or simplex.  Psychiatric: She has a normal mood and affect. Her behavior is normal. Judgment and thought content normal.     Labs reviewed: Appointment on 05/22/2014  Component Date Value Ref Range Status  . TSH 05/22/2014 4.080  0.450 - 4.500 uIU/mL Final  Office Visit on 04/08/2014  Component Date Value Ref Range Status  . HM Mammogram 03/09/2011 Normal, [facility]    Final  . HM Dexa Scan 03/09/2011 Osteopenia, [facility]    Final  . BUN 03/07/2014 29* 4 - 21 mg/dL Final  . Creatinine 03/07/2014 0.8  0.5 - 1.1 mg/dL Final  . Potassium 03/07/2014 4.5  3.4 - 5.3 mmol/L Final  . Sodium 03/07/2014 139  137 - 147 mmol/L Final  . WBC 04/08/2014 7.0  3.4 - 10.8 x10E3/uL Final  . RBC 04/08/2014 3.81  3.77 - 5.28 x10E6/uL Final  . Hemoglobin 04/08/2014 11.3  11.1 - 15.9 g/dL Final  . HCT 04/08/2014 36.7  34.0 - 46.6 % Final  . MCV 04/08/2014 96  79 - 97 fL Final  . MCH 04/08/2014 29.7  26.6 - 33.0 pg Final  . MCHC 04/08/2014 30.8* 31.5 - 35.7 g/dL Final  . RDW 04/08/2014 17.6* 12.3 - 15.4 % Final  . Platelets  04/08/2014 130* 150 - 379  x10E3/uL Final  . Neutrophils Relative % 04/08/2014 63   Final  . Lymphs 04/08/2014 23   Final  . Monocytes 04/08/2014 11   Final  . Eos 04/08/2014 3   Final  . Basos 04/08/2014 0   Final  . Neutrophils Absolute 04/08/2014 4.4  1.4 - 7.0 x10E3/uL Final  . Lymphocytes Absolute 04/08/2014 1.6  0.7 - 3.1 x10E3/uL Final  . Monocytes Absolute 04/08/2014 0.8  0.1 - 0.9 x10E3/uL Final  . Eosinophils Absolute 04/08/2014 0.2  0.0 - 0.4 x10E3/uL Final  . Basophils Absolute 04/08/2014 0.0  0.0 - 0.2 x10E3/uL Final  . Immature Granulocytes 04/08/2014 0   Final  . Immature Grans (Abs) 04/08/2014 0.0  0.0 - 0.1 x10E3/uL Final  . Glucose 04/08/2014 86  65 - 99 mg/dL Final  . BUN 04/08/2014 23  8 - 27 mg/dL Final  . Creatinine, Ser 04/08/2014 0.81  0.57 - 1.00 mg/dL Final  . GFR calc non Af Amer 04/08/2014 66  >59 mL/min/1.73 Final  . GFR calc Af Amer 04/08/2014 76  >59 mL/min/1.73 Final  . BUN/Creatinine Ratio 04/08/2014 28* 11 - 26 Final  . Sodium 04/08/2014 137  134 - 144 mmol/L Final  . Potassium 04/08/2014 5.3* 3.5 - 5.2 mmol/L Final  . Chloride 04/08/2014 100  97 - 108 mmol/L Final  . CO2 04/08/2014 24  18 - 29 mmol/L Final  . Calcium 04/08/2014 9.6  8.7 - 10.3 mg/dL Final  . Hgb A1c MFr Bld 04/08/2014 6.4* 4.8 - 5.6 % Final   Comment:          Pre-diabetes: 5.7 - 6.4          Diabetes: >6.4          Glycemic control for adults with diabetes: <7.0   . Est. average glucose Bld gHb Est-m* 04/08/2014 137   Final  . Cholesterol, Total 04/08/2014 114  100 - 199 mg/dL Final  . Triglycerides 04/08/2014 96  0 - 149 mg/dL Final  . HDL 04/08/2014 47  >39 mg/dL Final   Comment: According to ATP-III Guidelines, HDL-C >59 mg/dL is considered a negative risk factor for CHD.   Marland Kitchen VLDL Cholesterol Cal 04/08/2014 19  5 - 40 mg/dL Final  . LDL Calculated 04/08/2014 48  0 - 99 mg/dL Final  . Chol/HDL Ratio 04/08/2014 2.4  0.0 - 4.4 ratio units Final   Comment:                                    T. Chol/HDL Ratio                                             Men  Women                               1/2 Avg.Risk  3.4    3.3                                   Avg.Risk  5.0    4.4  2X Avg.Risk  9.6    7.1                                3X Avg.Risk 23.4   11.0   . TSH 04/08/2014 5.750* 0.450 - 4.500 uIU/mL Final     Assessment/Plan  1. Folliculitis Areas healing. No medication is needed. I advised patient that the area should be fully healed within a week. Should be allowed to return to physical therapy.

## 2014-06-27 ENCOUNTER — Ambulatory Visit: Payer: Medicare Other

## 2014-06-27 ENCOUNTER — Ambulatory Visit: Payer: Medicare Other | Admitting: Occupational Therapy

## 2014-06-27 ENCOUNTER — Ambulatory Visit: Payer: Medicare Other | Admitting: Physical Therapy

## 2014-06-27 DIAGNOSIS — R531 Weakness: Secondary | ICD-10-CM

## 2014-06-27 DIAGNOSIS — R278 Other lack of coordination: Secondary | ICD-10-CM

## 2014-06-27 DIAGNOSIS — R490 Dysphonia: Secondary | ICD-10-CM

## 2014-06-27 DIAGNOSIS — R29898 Other symptoms and signs involving the musculoskeletal system: Secondary | ICD-10-CM

## 2014-06-27 DIAGNOSIS — R279 Unspecified lack of coordination: Principal | ICD-10-CM

## 2014-06-27 DIAGNOSIS — R258 Other abnormal involuntary movements: Secondary | ICD-10-CM

## 2014-06-27 DIAGNOSIS — R269 Unspecified abnormalities of gait and mobility: Secondary | ICD-10-CM

## 2014-06-27 NOTE — Patient Instructions (Signed)
Continue loud "ah" at home five times twice a day  Complete speech activities for 15-20 minutes a day, 3 times a week  Consider coming to the [place], "[facility]"

## 2014-06-27 NOTE — Therapy (Signed)
[facility] [address] [place], [place], [address] Phone: [phone]   Fax:  [phone]  Occupational Therapy Treatment  Patient Details  Name: Victoria Lindsey MRN: [id number] Date of Birth: [birth year] Referring Provider:  [name], DO  Encounter Date: 06/27/2014      OT End of Session - 06/27/14 1611    Visit Number 24   Number of Visits 24   Date for OT Re-Evaluation 06/27/14   Authorization Type Medicare U[employer]   Authorization Time Period G code needed, renewed 05/28/14          OT Start Time 1535   OT Stop Time 1610   OT Time Calculation (min) 35 min   Activity Tolerance Patient tolerated treatment well   Behavior During Therapy [facility] for tasks assessed/performed      Past Medical History  Diagnosis Date  . Hypertension   . Parkinson's disease   . Hyperlipidemia   . Dizziness   . Orthostatic hypotension   . Anemia   . Tremor   . Breast cancer     LEFT BREAST  . Kidney failure     stage 3  . Hypothyroidism   . Prediabetes   . TIA (transient ischemic attack)   . Hyperglycemia   . Left ventricular diastolic dysfunction   . Hypophonia     Past Surgical History  Procedure Laterality Date  . Cardiovascular stress test  06/2007    NORMAL  . Breast lumpectomy  2010  . Cholecystectomy  1994  . Tonsillectomy    . Nasal sinus surgery      submucous resection late 1950s  . Cataract extraction  2005,2006    Dr.Devonzo    There were no vitals filed for this visit.  Visit Diagnosis:  Decreased coordination  Rigidity  Weakness  Bradykinesia      Subjective Assessment - 06/27/14 1538    Subjective  Pt reports she fried an egg for her husband   Currently in Pain? Yes   Pain Score 1    Pain Location Hip   Pain Orientation Right   Pain Descriptors / Indicators Burning   Pain Onset In the past 7 days   Pain Frequency Intermittent   Aggravating Factors  unsure   Pain Relieving Factors  gets better on its own   Multiple Pain Sites No        Treatment: bag exercises for simulated ADLS from HEP, 10 reps each. Arm bike x 6 mins  Level 1 for conditioning, pt maintained 20-30RPMs.                 [facility]) - 06/27/14 1635    PWR! exercises Moves in sitting   PWR! Up 10   PWR! Rock 10   PWR! Twist 10   PWR! Step 10               OT Short Term Goals - 06/27/14 1559    OT SHORT TERM GOAL #1   Title I with inital HEP.   Baseline ck 05/03/14   Time 4   Period Weeks   Status Achieved   OT SHORT TERM GOAL #2   Title Pt / family will verbalize understanding of adapted strategies for ADLS/ IADLS.   Time 4   Period Weeks   Status Achieved   OT SHORT TERM GOAL #3   Title Pt will demonstrate improved indpendence with dressing as evidenced by decreasing PPT #4 to 45 secs or  less.   Baseline 05/28/14-36.79 secs   Time 4   Period Weeks   Status Achieved  not met 04/29/14:  53.66sec   OT SHORT TERM GOAL #4   Title Pt will demonstrate ability to write 4 sentences with 100 % legibility and minimal decrease in letter size.   Baseline 100% legibility and no significant decrease in size, 05/28/14   Time 4   Period Weeks   Status Achieved  Not met 04/29/14--95% legibility and min-mod decrease in size   OT SHORT TERM GOAL #5   Title Upgraded goal- Pt will demonstrate improved indpendence with dressing as evidenced by performing PPT #4 in 30 secs without LOB.   Baseline 30.5 secs   Time 4   Period Weeks   Status Partially Met   OT SHORT TERM GOAL #6   Title Pt will perform simple stove top cooking task with close supervision.   Baseline performed with supervision/ set up yesterday   Time 4   Period Weeks   Status Achieved           OT Long Term Goals - 06/27/14 1600    OT LONG TERM GOAL #1   Title Pt will perfom dressing with supervision/set up.   Baseline ck 06/01/14   Time 8   Period Weeks   Status Achieved   OT LONG TERM GOAL #2   Title Pt  will perform light home management/ snack prep with min A.   Baseline pt makes her own sausage biscuit and clleans out sink.   Time 8   Period Weeks   Status Achieved   OT LONG TERM GOAL #3   Title Pt will demonstrate improved standing balance for ADLS as evidenced by increasing standing functional reach by 3 inches bilaterally.(see below for revised goal on 05/28/14)   Baseline not met, RUE 9 inches, LUE 8.5 inches, improved but not fully met   Time 8   Period Weeks   Status Not Met   OT LONG TERM GOAL #4   Title Pt will demonstrate increased ease with feeding as evidenced by decreasing PPT #2 to 10 secs or less.   Baseline 11.53 secs 06/25/14   Time 8   Period Weeks   Status Not Met   OT LONG TERM GOAL #5   Title Pt/ family will verbalize understanding of compensatory strategies for cognition and ways to keep thinking skills sharp   Baseline handout issued 05/24/14   Time 8   Period Weeks   Status Achieved   OT LONG TERM GOAL #6   Title Pt will perform basic cooking/ home management in standing at a modified independent level   Baseline supervision   Time 8   Period Weeks   Status Not Met               Plan - 06/27/14 1548    Clinical Impression Statement Pt agrees with plans for discharge. She has made good progress.   Pt will benefit from skilled therapeutic intervention in order to improve on the following deficits (Retired) Decreased cognition;Decreased knowledge of use of DME;Abnormal gait;Impaired flexibility;Pain;Decreased mobility;Decreased coordination;Decreased activity tolerance;Decreased endurance;Decreased range of motion;Decreased strength;Decreased balance;Decreased knowledge of precautions;Decreased safety awareness;Impaired perceived functional ability;Difficulty walking;Impaired UE functional use   Rehab Potential Good   Clinical Impairments Affecting Rehab Potential decreased strength, decreased coordination, bradykinesia   OT Frequency 2x / week   OT  Duration 8 weeks   OT Treatment/Interventions Self-care/ADL training;Fluidtherapy;DME and/or AE instruction;Splinting;Patient/family education;Balance training;Therapeutic exercises;Ultrasound;Therapeutic exercise;Therapeutic  activities;Cognitive remediation/compensation;Passive range of motion;Functional Mobility Training;Neuromuscular education;Iontophoresis;Parrafin;Energy conservation;Manual Therapy;Visual/perceptual remediation/compensation   Plan d/c OT   OT Home Exercise Plan  HEP issued-[facility] seated, coordination, bag exercises (crumple, behind head, behind back, in fron pass), ways to incorporate big movements into ADLs   Recommended Other Services Pt does not have shingles   Consulted and Agree with Plan of Care Patient          G-Codes - 06/28/2014 1633    Functional Assessment Tool Used Pt is dressing herself and performs simple cooking at a supervision/ set up level   Functional Limitation Self care   Self Care Goal Status (Y3016) At least 20 percent but less than 40 percent impaired, limited or restricted   Self Care Discharge Status [phone]) At least 20 percent but less than 40 percent impaired, limited or restricted      Problem List Patient Active Problem List   Diagnosis Date Noted  . Folliculitis 23/55/7322  . Encephalopathy, hypertensive 05/29/2014  . Dizziness of unknown cause 02/21/2014  . Left ventricular diastolic dysfunction with preserved systolic function 02/54/2706  . Essential hypertension 12/30/2013  . Transaminitis 12/24/2013  . Palpitations 12/23/2013  . Respiratory insufficiency 12/22/2013  . Normocytic anemia 12/21/2013  . Thrombocytopenia 12/21/2013  . Abnormal ECG 12/15/2013  . Abnormal involuntary movement 12/15/2013  . Colorectal polyps 12/15/2013  . Chest pain 12/15/2013  . Arteriosclerosis of coronary artery 12/15/2013  . Chronic kidney disease (CKD), stage III (moderate) 12/15/2013  . Corn 12/15/2013  . Diabetes 12/15/2013  . Breathing  difficult 12/15/2013  . Acid reflux 12/15/2013  . HLD (hyperlipidemia) 12/15/2013  . BP (high blood pressure) 12/15/2013  . FOM (frequency of micturition) 12/15/2013  . Breast cancer, female 12/15/2013  . Fungal infection of nail 12/15/2013  . Osteopenia 12/15/2013  . Raynaud's syndrome 12/15/2013  . Restless leg 12/15/2013  . Gougerout-Sjoegren syndrome 12/15/2013  . Change in blood platelet count 12/15/2013  . Infection of urinary tract 12/15/2013  . Lower urinary tract infection 10/12/2013  . Idiopathic Parkinson's disease 09/06/2013  . Bladder spasm 09/05/2013  . Urgency of micturation 09/05/2013  . Dyspnea 06/15/2012  . Hyperglycemia 12/27/2011  . Sjogren's syndrome 12/17/2010  . TIA (transient ischemic attack) 12/17/2010  . Benign hypertensive heart disease without heart failure 09/01/2010  . Parkinson's disease 09/01/2010  . Hypothyroidism 09/01/2010  . Hypercholesterolemia 09/01/2010  . Chronic anemia 09/01/2010   OCCUPATIONAL THERAPY DISCHARGE SUMMARY    Current functional level related to goals / functional outcomes: Pt met 3/6 long term goals. She demonstrates excellent overall progress.   Remaining deficits: Decreased strength, decreased coordination, bradykinesia, decreased balance, decreased functional mobility   Education / Equipment: Pt was instructed in :HEP, adapted strategies for ADLS/IADLs, and regarding community resources.Pt verbalized understanding of all education.  Pt can benefit from a screen in 6 months due to the progressive nature of her disease. Plan: Patient agrees to discharge.  Patient goals were partially met. Patient is being discharged due to meeting the stated rehab goals.  ?????     RINE,KATHRYN June 28, 2014, 4:36 PM Theone Murdoch, OTR/L [phone] Phone: [phone] 4:36 PM 06/28/14 [facility] [address] [place], [place], [address] Phone: [phone]    Fax:  [phone]

## 2014-06-27 NOTE — Therapy (Signed)
[facility] [address] [place], [place], [address] Phone: [phone]   Fax:  [phone]  Speech Language Pathology Treatment  Patient Details  Name: Victoria Lindsey MRN: [id number] Date of Birth: 06-04-1925 Referring Provider:  [name], DO  Encounter Date: 06/27/2014      End of Session - 06/27/14 1526    Visit Number 16   Number of Visits 16   Date for SLP Re-Evaluation 07/07/14   SLP Start Time 1450   SLP Stop Time  1530   SLP Time Calculation (min) 40 min   Activity Tolerance Patient tolerated treatment well      Past Medical History  Diagnosis Date  . Hypertension   . Parkinson's disease   . Hyperlipidemia   . Dizziness   . Orthostatic hypotension   . Anemia   . Tremor   . Breast cancer     LEFT BREAST  . Kidney failure     stage 3  . Hypothyroidism   . Prediabetes   . TIA (transient ischemic attack)   . Hyperglycemia   . Left ventricular diastolic dysfunction   . Hypophonia     Past Surgical History  Procedure Laterality Date  . Cardiovascular stress test  06/2007    NORMAL  . Breast lumpectomy  2010  . Cholecystectomy  1994  . Tonsillectomy    . Nasal sinus surgery      submucous resection late 1950s  . Cataract extraction  2005,2006    Dr.Devonzo    There were no vitals filed for this visit.  Visit Diagnosis: Hypokinetic Parkinsonian dysphonia      Subjective Assessment - 06/27/14 1458    Subjective Pt is ready for d/c today.   Currently in Pain? No/denies               ADULT SLP TREATMENT - 06/27/14 1500    General Information   Behavior/Cognition Alert;Cooperative;Pleasant mood   Treatment Provided   Treatment provided Cognitive-Linquistic   Pain Assessment   Pain Assessment No/denies pain   Cognitive-Linquistic Treatment   Treatment focused on Dysarthria   Skilled Treatment Loud /a/ average 84dB with occasional min A for loudness. Simple  conversation req'd usual min-mod A for loudness, and min-mod A  usually for breathing appropriately. Usual mod A for loudness >/= 69dB necessary in sentence tasks.   Assessment / Recommendations / Plan   Plan Discharge SLP treatment due to (comment)   Progression Toward Goals   Progression toward goals Goals met, education completed, patient discharged from [facility] - 06/20/14 [place] #1   Title pt will maintain average of loud /a/ at 80dB for 5 sessions   Status Not Met  cues needed   SLP SHORT TERM GOAL #2   Title pt will produce 16/20 sentences with average 70dB   Status Not Met   SLP SHORT TERM GOAL #3   Title pt will improve loudness in 5 minutes simple conversation to 70dB with rare min A   Status Not Met          SLP Long Term Goals - 06/27/14 1527    SLP LONG TERM GOAL #1   Title pt will maintain average loudness of 80dB with loud /a/ over 7 sessions with rare min A   Status Achieved   SLP LONG TERM GOAL #2   Title  pt will improve loudness to 70dB in 5 minutes simple/mod complex conversation with occasional min A   Time 1   Period Weeks   Status Not Met   SLP LONG TERM GOAL #3   Title pt will report feeling abdominal movement necessary for louder speech in simple conversation with occasional min A   Status Achieved          Plan - 2014-07-05 1527    Clinical Impression Statement Pt cont to require cues for breathing and for louder speech in sentences and especially in conversational speech. Discharge today due to max progress reached.   Treatment/Interventions Internal/external aids;Functional tasks;Cueing hierarchy;Patient/family education;Compensatory strategies;SLP instruction and feedback   Potential to Achieve Goals Good   Potential Considerations Ability to learn/carryover information;Severity of impairments          G-Codes - 2014-07-05 1528    Functional Assessment Tool Used nms   Functional  Limitations Motor speech   Motor Speech Goal Status [phone]) At least 1 percent but less than 20 percent impaired, limited or restricted   Motor Speech Goal Status ([id number]) At least 20 percent but less than 40 percent impaired, limited or restricted     [place]  Visits from Start of Care: 16  Current functional level related to goals / functional outcomes: Pt met LTGs except for loudness. She has improved somewhat with loudness overall but requires usual cues in conversation for louder speech as well as for adequate breathing. For details, see goals above.    Remaining deficits: Mild-mod dysarthria c/b inadequate speech loudness (although improved from eval), and insufficient breath support.   Education / Equipment: Loud "ah", home tasks, need for louder speech and taking adequate breath prior to talking.  Plan: Patient agrees to discharge.  Patient goals were partially met. Patient is being discharged due to                                                     ?????meeting current rehab potential with loudness and breathing.       Problem List Patient Active Problem List   Diagnosis Date Noted  . Folliculitis 73/41/9379  . Encephalopathy, hypertensive 05/29/2014  . Dizziness of unknown cause 02/21/2014  . Left ventricular diastolic dysfunction with preserved systolic function 02/40/9735  . Essential hypertension 12/30/2013  . Transaminitis 12/24/2013  . Palpitations 12/23/2013  . Respiratory insufficiency 12/22/2013  . Normocytic anemia 12/21/2013  . Thrombocytopenia 12/21/2013  . Abnormal ECG 12/15/2013  . Abnormal involuntary movement 12/15/2013  . Colorectal polyps 12/15/2013  . Chest pain 12/15/2013  . Arteriosclerosis of coronary artery 12/15/2013  . Chronic kidney disease (CKD), stage III (moderate) 12/15/2013  . Corn 12/15/2013  . Diabetes 12/15/2013  . Breathing difficult 12/15/2013  . Acid reflux 12/15/2013  . HLD (hyperlipidemia)  12/15/2013  . BP (high blood pressure) 12/15/2013  . FOM (frequency of micturition) 12/15/2013  . Breast cancer, female 12/15/2013  . Fungal infection of nail 12/15/2013  . Osteopenia 12/15/2013  . Raynaud's syndrome 12/15/2013  . Restless leg 12/15/2013  . Gougerout-Sjoegren syndrome 12/15/2013  . Change in blood platelet count 12/15/2013  . Infection of urinary tract 12/15/2013  . Lower urinary tract infection 10/12/2013  . Idiopathic Parkinson's disease 09/06/2013  . Bladder spasm 09/05/2013  . Urgency of micturation 09/05/2013  . Dyspnea 06/15/2012  .  Hyperglycemia 12/27/2011  . Sjogren's syndrome 12/17/2010  . TIA (transient ischemic attack) 12/17/2010  . Benign hypertensive heart disease without heart failure 09/01/2010  . Parkinson's disease 09/01/2010  . Hypothyroidism 09/01/2010  . Hypercholesterolemia 09/01/2010  . Chronic anemia 09/01/2010    Garald Balding, SLP 06/27/2014, 3:30 PM  [facility] [address] [place], [place], [address] Phone: [phone]   Fax:  [phone]

## 2014-06-27 NOTE — Patient Instructions (Signed)
Optimal Community Fitness Options:  *Exercises from therapy-do these daily  *Cardio (recumbent bike)-3 times a week working up to 30 minutes at a time.   Start with 5 minutes on bike and rest 5 minutes then gradually add a minute to time and take a minute away from rest (i.e.bike for 6 minutes and rest for 4, bike for 7 minutes and rest 3 minutes)  *Walking at [facility] or in house-5 times a week working up to 30 minutes at a time.  Start with 5 minutes walk and 5 minute rest then gradually add a minute to walk and take a minute away from rest (i.e. Walk for 6 minutes and rest for 4, walk for 7 minutes and rest for 3 minutes)

## 2014-07-01 DIAGNOSIS — R279 Unspecified lack of coordination: Secondary | ICD-10-CM | POA: Diagnosis not present

## 2014-07-01 NOTE — Therapy (Signed)
[facility] [address] [place], [place], [address] Phone: [phone]   Fax:  [phone]  Physical Therapy Treatment  Patient Details  Name: Victoria Lindsey MRN: [id number] Date of Birth: [birth year] Referring Provider:  [name], DO  Encounter Date: 06/27/2014      PT End of Session - 07/01/14 1427    Visit Number 24   Number of Visits 24   Date for PT Re-Evaluation 07/04/14   PT Start Time 1404   PT Stop Time 1445   PT Time Calculation (min) 41 min   Activity Tolerance Patient tolerated treatment well   Behavior During Therapy [facility] for tasks assessed/performed      Past Medical History  Diagnosis Date  . Hypertension   . Parkinson's disease   . Hyperlipidemia   . Dizziness   . Orthostatic hypotension   . Anemia   . Tremor   . Breast cancer     LEFT BREAST  . Kidney failure     stage 3  . Hypothyroidism   . Prediabetes   . TIA (transient ischemic attack)   . Hyperglycemia   . Left ventricular diastolic dysfunction   . Hypophonia     Past Surgical History  Procedure Laterality Date  . Cardiovascular stress test  06/2007    NORMAL  . Breast lumpectomy  2010  . Cholecystectomy  1994  . Tonsillectomy    . Nasal sinus surgery      submucous resection late 1950s  . Cataract extraction  2005,2006    Dr.Devonzo    There were no vitals filed for this visit.  Visit Diagnosis:  Abnormality of gait                       [facility] Adult PT Treatment/Exercise - 07/01/14 1435    Berg Balance Test   Sit to Stand Able to stand without using hands and stabilize independently   Standing Unsupported Able to stand safely 2 minutes   Sitting with Back Unsupported but Feet Supported on Floor or Stool Able to sit safely and securely 2 minutes   Stand to Sit Sits safely with minimal use of hands   Transfers Able to transfer safely, minor use of hands   Standing Unsupported with Eyes  Closed Able to stand 10 seconds safely   Standing Ubsupported with Feet Together Able to place feet together independently but unable to hold for 30 seconds   From Standing, Reach Forward with Outstretched Arm Can reach confidently >25 cm (10")   From Standing Position, Pick up Object from Floor Able to pick up shoe, needs supervision   From Standing Position, Turn to Look Behind Over each Shoulder Turn sideways only but maintains balance   Turn 360 Degrees Needs assistance while turning   Standing Unsupported, Alternately Place Feet on Step/Stool Able to complete >2 steps/needs minimal assist   Standing Unsupported, One Foot in Front Loses balance while stepping or standing   Standing on One Leg Unable to try or needs assist to prevent fall   Total Score 36                PT Education - 07/01/14 1425    Education provided Yes   Education Details Optimal community fitness upon discharge, walking program, cardio program, progress toward goals, PT screen in 4-6 months   Person(s) Educated Patient;Child(ren)   Methods Explanation;Handout   Comprehension Verbalized understanding  PT Short Term Goals - 05/06/14 1256    PT SHORT TERM GOAL #1   Title Pt will perform HEP with supervision for improved strength, balance, and gait. (Target date 05/03/14)   Status Not Met   PT SHORT TERM GOAL #2   Title Pt will improve Berg Balance score to at least 25/56 for decreased fall risk.   Status Achieved   PT SHORT TERM GOAL #3   Title Pt will improve TUG score to less than or equal to 30 seconds for improved ADL participation in home.   Status Achieved   PT SHORT TERM GOAL #4   Title Pt will perform at least 8 of 10 reps of sit<>stand with minimal UE support from <18 inch surfaces, for improved transfer efficiency and safety.   Status Not Met           PT Long Term Goals - Jul 29, 2014 1426    PT LONG TERM GOAL #1   Title Pt will verbalize plans for continued community fitness  upon D/C from PT   Status Achieved   PT LONG TERM GOAL #2   Title Pt will improve Berg Balance score to at least 37/56 for decreased fall risk.   Status Not Met  36/57   PT LONG TERM GOAL #3   Title Pt will improve TUG score to less than or equal to 25 seconds for decreased fall risk.   Baseline 06/25/14-24.84 sec   Status Achieved   PT LONG TERM GOAL #4   Title Pt will improve gait velocity to at least 2 ft/sec for improved gait efficiency and safety.   Baseline 2.05 ft/sec 06/25/14   Status Achieved   PT LONG TERM GOAL #5   Title Pt will verbalize continued community fitness plans upon D/C from PT.   Status Achieved               Plan - Jul 29, 2014 1428    Clinical Impression Statement Pt met LTG # 5.  LTG # 2 unmet as pt scored 36/57-continues with LOB posteriorly at times, even with static standing, with poor balance reactions.  Discussed optimal community fitness options, walking program and cardio program with patient and daughter who agrre.  Pt to be screened in 4-6 months.  Discharge per Mady Haagensen, PT.   PT Next Visit Plan Discharge   Consulted and Agree with Plan of Care Patient;Family member/caregiver   Family Member Consulted daughter          G-Codes - Jul 29, 2014 1432    Functional Assessment Tool Used BERG 36/57, gait velocity 2.04 ft/sec, TUG 24.84 seconds with rollator      Problem List Patient Active Problem List   Diagnosis Date Noted  . Folliculitis 56/25/6389  . Encephalopathy, hypertensive 05/29/2014  . Dizziness of unknown cause 02/21/2014  . Left ventricular diastolic dysfunction with preserved systolic function 37/34/2876  . Essential hypertension 12/30/2013  . Transaminitis 12/24/2013  . Palpitations 12/23/2013  . Respiratory insufficiency 12/22/2013  . Normocytic anemia 12/21/2013  . Thrombocytopenia 12/21/2013  . Abnormal ECG 12/15/2013  . Abnormal involuntary movement 12/15/2013  . Colorectal polyps 12/15/2013  . Chest pain 12/15/2013   . Arteriosclerosis of coronary artery 12/15/2013  . Chronic kidney disease (CKD), stage III (moderate) 12/15/2013  . Corn 12/15/2013  . Diabetes 12/15/2013  . Breathing difficult 12/15/2013  . Acid reflux 12/15/2013  . HLD (hyperlipidemia) 12/15/2013  . BP (high blood pressure) 12/15/2013  . FOM (frequency of micturition) 12/15/2013  . Breast cancer, female  12/15/2013  . Fungal infection of nail 12/15/2013  . Osteopenia 12/15/2013  . Raynaud's syndrome 12/15/2013  . Restless leg 12/15/2013  . Gougerout-Sjoegren syndrome 12/15/2013  . Change in blood platelet count 12/15/2013  . Infection of urinary tract 12/15/2013  . Lower urinary tract infection 10/12/2013  . Idiopathic Parkinson's disease 09/06/2013  . Bladder spasm 09/05/2013  . Urgency of micturation 09/05/2013  . Dyspnea 06/15/2012  . Hyperglycemia 12/27/2011  . Sjogren's syndrome 12/17/2010  . TIA (transient ischemic attack) 12/17/2010  . Benign hypertensive heart disease without heart failure 09/01/2010  . Parkinson's disease 09/01/2010  . Hypothyroidism 09/01/2010  . Hypercholesterolemia 09/01/2010  . Chronic anemia 09/01/2010    [facility] [address] [place], [place], [address] Phone: [phone]   Fax:  [place], [place] 07/01/2014 2:35 PM Phone: [phone] Fax: [phone]

## 2014-07-02 ENCOUNTER — Ambulatory Visit: Payer: Medicare Other | Admitting: Occupational Therapy

## 2014-07-02 ENCOUNTER — Ambulatory Visit: Payer: Medicare Other

## 2014-07-02 ENCOUNTER — Ambulatory Visit: Payer: Medicare Other | Admitting: Physical Therapy

## 2014-07-04 ENCOUNTER — Other Ambulatory Visit: Payer: Self-pay | Admitting: Internal Medicine

## 2014-07-04 ENCOUNTER — Encounter: Payer: Medicare Other | Admitting: Occupational Therapy

## 2014-07-04 ENCOUNTER — Ambulatory Visit: Payer: Medicare Other | Admitting: Physical Therapy

## 2014-07-08 ENCOUNTER — Encounter: Payer: Self-pay | Admitting: Internal Medicine

## 2014-07-08 ENCOUNTER — Ambulatory Visit (INDEPENDENT_AMBULATORY_CARE_PROVIDER_SITE_OTHER): Payer: Medicare Other | Admitting: Internal Medicine

## 2014-07-08 VITALS — BP 120/82 | HR 84 | Temp 97.6°F | Resp 20 | Ht 61.0 in | Wt 116.2 lb

## 2014-07-08 DIAGNOSIS — N183 Chronic kidney disease, stage 3 unspecified: Secondary | ICD-10-CM

## 2014-07-08 DIAGNOSIS — R634 Abnormal weight loss: Secondary | ICD-10-CM

## 2014-07-08 DIAGNOSIS — R1031 Right lower quadrant pain: Secondary | ICD-10-CM | POA: Diagnosis not present

## 2014-07-08 DIAGNOSIS — E039 Hypothyroidism, unspecified: Secondary | ICD-10-CM

## 2014-07-08 DIAGNOSIS — G20A1 Parkinson's disease without dyskinesia, without mention of fluctuations: Secondary | ICD-10-CM

## 2014-07-08 DIAGNOSIS — M3500 Sicca syndrome, unspecified: Secondary | ICD-10-CM

## 2014-07-08 DIAGNOSIS — E119 Type 2 diabetes mellitus without complications: Secondary | ICD-10-CM | POA: Diagnosis not present

## 2014-07-08 DIAGNOSIS — G20 Parkinson's disease: Secondary | ICD-10-CM

## 2014-07-08 NOTE — Progress Notes (Signed)
Patient ID: Victoria Lindsey, female   DOB: 1925-06-11, 81 y.o.   MRN: [id number]   Location:  [facility] / [facility]  Code Status: DNR Goals of Care: Advanced Directive information Does patient have an advance directive?: Yes, Type of Advance Directive: [facility];Living will, Does patient want to make changes to advanced directive?: No - Patient declined   Allergies  Allergen Reactions  . Aciphex [Rabeprazole Sodium]   . Amantadines   . Avapro [Irbesartan]   . Carbidopa-Levodopa   . Cardura [Doxazosin Mesylate]   . Ciprofloxacin   . Clonidine Derivatives   . Cozaar   . Diltiazem   . Famotidine   . Hctz [Hydrochlorothiazide]   . Indapamide   . Lansoprazole   . Lisinopril     Mouth problems  . Loratadine   . Maxidex [Dexamethasone]   . Mirapex [Pramipexole Dihydrochloride]   . Norvasc [Amlodipine Besylate]   . Pantoprazole Sodium   . Penicillins   . Prilosec [Omeprazole]   . Procardia [Nifedipine]   . Proton Pump Inhibitors   . Sulfa Drugs Cross Reactors   . Trimethoprim   . Zyrtec [Cetirizine Hcl]     Chief Complaint  Patient presents with  . Medical Management of Chronic Issues    3 month follow-up for medication management, discuss labs (copy printed), patient c/o has bruising that has gotten worse,pain in lower abdomen right before urinating ,dry mouth still bothers her and inside of mouth peels    HPI: Patient is a 81 y.o. white female seen in the office today for f/u of chronic conditions and complaints as above.  Her daughter, [name], is sick today.  Dry mouth that peels and drools.  Says it's too much.    Having some abdominal pain in her lower right side before she urinates.  No blood noted.  No other time.  No back pain related to it.  No dysuria.  Has another area on her back that has burning, tingling and soreness that comes and goes--previously told she had an ingrown hair there.   No fever, chills.   Liked  the butter pecan glucerna--drinking one per day.    Thyroid lab was improved.  I will recheck next visit.  Is bruising more.  Most on left arm, some on right arm, some on right leg. Left leg looks good.  Had been on keflex to prevent UTIs, but no longer on this.     Review of Systems:  Review of Systems  Constitutional: Negative for fever and chills.  HENT: Negative for congestion.   Eyes:       Glasses  Respiratory: Negative for shortness of breath.   Cardiovascular: Negative for chest pain.  Gastrointestinal: Negative for abdominal pain.  Genitourinary: Positive for dysuria. Negative for urgency, frequency and hematuria.  Musculoskeletal: Negative for falls.  Skin: Positive for itching and rash.  Neurological: Positive for tremors.  Endo/Heme/Allergies:       Diet controlled diabetes     Past Medical History  Diagnosis Date  . Hypertension   . Parkinson's disease   . Hyperlipidemia   . Dizziness   . Orthostatic hypotension   . Anemia   . Tremor   . Breast cancer     LEFT BREAST  . Kidney failure     stage 3  . Hypothyroidism   . Prediabetes   . TIA (transient ischemic attack)   . Hyperglycemia   . Left ventricular diastolic dysfunction   .  Hypophonia     Past Surgical History  Procedure Laterality Date  . Cardiovascular stress test  06/2007    NORMAL  . Breast lumpectomy  2010  . Cholecystectomy  1994  . Tonsillectomy    . Nasal sinus surgery      submucous resection late 1950s  . Cataract extraction  2005,2006    Dr.Devonzo    Social History:   reports that she quit smoking about 50 years ago. Her smoking use included Cigarettes. She has a 6 pack-year smoking history. She has never used smokeless tobacco. She reports that she does not drink alcohol or use illicit drugs.  Family History  Problem Relation Age of Onset  . Heart disease Mother   . Heart failure Father   . Parkinsonism Father   . COPD Sister   . Cancer Brother   . Osteoporosis  Daughter   . Diabetes Father   . Stroke Sister     Medications: Patient's Medications  New Prescriptions   No medications on file  Previous Medications   AMBULATORY NON FORMULARY MEDICATION    Outpatient Speech Therapy, evaluate and treat.  DX: G20, R49.8   ASPIRIN 81 MG TABLET    Take 81 mg by mouth every evening.    BIOTIN PO    Take 1,000 mcg by mouth daily.    CHOLECALCIFEROL (VITAMIN D3) 2000 UNITS TABS    Take 1 tablet by mouth daily.    CLOPIDOGREL (PLAVIX) 75 MG TABLET    TAKE 1 TABLET BY MOUTH EVERY DAY   CRANBERRY 500 MG CAPS    Take 500 mg by mouth daily.    CRESTOR 10 MG TABLET    TAKE 1 TABLET BY MOUTH DAILY   DOCUSATE SODIUM (COLACE) 100 MG CAPSULE    Take 100 mg by mouth daily.   FOLIC ACID (FOLVITE) 323 MCG TABLET    Take 800 mcg by mouth daily.    FUROSEMIDE (LASIX) 20 MG TABLET    Take 1 tablet (20 mg total) by mouth daily.   LEVOTHYROXINE (SYNTHROID, LEVOTHROID) 112 MCG TABLET    TAKE 1 TABLET BY MOUTH EVERY DAY   METOPROLOL (LOPRESSOR) 100 MG TABLET    Take 1 tablet (100 mg total) by mouth 2 (two) times daily.   MULTIPLE VITAMIN (MULTIVITAMIN WITH MINERALS) TABS TABLET    Take 1 tablet by mouth daily.   ONE TOUCH ULTRA TEST TEST STRIP       ONETOUCH DELICA LANCETS 55D MISC    by Does not apply route as directed. Use lancets to check blood sugars   PILOCARPINE (SALAGEN) 5 MG TABLET    Take 5 mg by mouth as needed.    PROBIOTIC PRODUCT (PROBIOTIC PO)    Take 1 tablet by mouth daily.   RASAGILINE (AZILECT) 1 MG TABS    Take 1 mg by mouth daily.   [place]    by Does not apply route.   ROTIGOTINE 8 MG/24HR PT24    Place 1 patch onto the skin daily.    SPIRONOLACTONE (ALDACTONE) 25 MG TABLET    Take 1 tablet (25 mg total) by mouth daily.  Modified Medications   No medications on file  Discontinued Medications   No medications on file     Physical Exam: Filed Vitals:   07/08/14 1041  BP: 120/82  Pulse: 84  Temp: 97.6 F (36.4 C)    TempSrc: Oral  Resp: 20  Height: 5\' 1"  (1.549 m)  Weight:  116 lb 3.2 oz (52.708 kg)  SpO2: 93%  Physical Exam  Constitutional: She is oriented to person, place, and time. She appears well-developed and well-nourished. No distress.  Thin white female  Cardiovascular: Normal rate, regular rhythm,   MRN: [id number]   Location:  [facility]  Code Status: DNR Goals of Care: Advanced Directive information Does patient have an advance directive?: Yes, Type of Advance Directive: [facility];Living will, Does patient want to make changes to advanced directive?: No - Patient declined   Allergies  Allergen Reactions  . Aciphex [Rabeprazole Sodium]   . Amantadines   . Avapro [Irbesartan]   . Carbidopa-Levodopa   . Cardura [Doxazosin Mesylate]   . Ciprofloxacin   . Clonidine Derivatives   . Cozaar   . Diltiazem   . Famotidine   . Hctz [Hydrochlorothiazide]   . Indapamide   . Lansoprazole   . Lisinopril     Mouth problems  . Loratadine   . Maxidex [Dexamethasone]   . Mirapex [Pramipexole Dihydrochloride]   . Norvasc [Amlodipine Besylate]   . Pantoprazole Sodium   . Penicillins   . Prilosec [Omeprazole]   . Procardia [Nifedipine]   . Proton Pump Inhibitors   . Sulfa Drugs Cross Reactors   . Trimethoprim   . Zyrtec [Cetirizine Hcl]     Chief Complaint  Patient presents with  . Medical Management of Chronic Issues    3 month follow-up for medication management, discuss labs (copy printed), patient c/o has bruising that has gotten worse,pain in lower abdomen right before urinating ,dry mouth still bothers her and inside of mouth peels    HPI: Patient is a 81 y.o. white female seen in the office today for f/u of chronic conditions and complaints as above.  Her daughter, [name], is sick today.  Dry mouth that peels and drools.  Says it's too much.    Having some abdominal pain in her lower right side before she urinates.  No blood noted.  No other time.  No back pain related to it.  No dysuria.  Has another area on her back that has burning, tingling and soreness that comes and goes--previously told she had an ingrown hair there.   No fever, chills.   Liked  the butter pecan glucerna--drinking one per day.    Thyroid lab was improved.  I will recheck next visit.  Is bruising more.  Most on left arm, some on right arm, some on right leg. Left leg looks good.  Had been on keflex to prevent UTIs, but no longer on this.     Review of Systems:  Review of Systems  Constitutional: Negative for fever and chills.  HENT: Negative for congestion.   Eyes:       Glasses  Respiratory: Negative for shortness of breath.   Cardiovascular: Negative for chest pain.  Gastrointestinal: Negative for abdominal pain.  Genitourinary: Positive for dysuria. Negative for urgency, frequency and hematuria.  Musculoskeletal: Negative for falls.  Skin: Positive for itching and rash.  Neurological: Positive for tremors.  Endo/Heme/Allergies:       Diet controlled diabetes     Past Medical History  Diagnosis Date  . Hypertension   . Parkinson's disease   . Hyperlipidemia   . Dizziness   . Orthostatic hypotension   . Anemia   . Tremor   . Breast cancer     LEFT BREAST  . Kidney failure     stage 3  . Hypothyroidism   . Prediabetes   . TIA (transient ischemic attack)   . Hyperglycemia   . Left ventricular diastolic dysfunction   .  Hypophonia     Past Surgical History  Procedure Laterality Date  . Cardiovascular stress test  06/2007    NORMAL  . Breast lumpectomy  2010  . Cholecystectomy  1994  . Tonsillectomy    . Nasal sinus surgery      submucous resection late 1950s  . Cataract extraction  2005,2006    Dr.Devonzo    Social History:   reports that she quit smoking about 50 years ago. Her smoking use included Cigarettes. She has a 6 pack-year smoking history. She has never used smokeless tobacco. She reports that she does not drink alcohol or use illicit drugs.  Family History  Problem Relation Age of Onset  . Heart disease Mother   . Heart failure Father   . Parkinsonism Father   . COPD Sister   . Cancer Brother   . Osteoporosis  Daughter   . Diabetes Father   . Stroke Sister     Medications: Patient's Medications  New Prescriptions   No medications on file  Previous Medications   AMBULATORY NON FORMULARY MEDICATION    Outpatient Speech Therapy, evaluate and treat.  DX: G20, R49.8   ASPIRIN 81 MG TABLET    Take 81 mg by mouth every evening.    BIOTIN PO    Take 1,000 mcg by mouth daily.    CHOLECALCIFEROL (VITAMIN D3) 2000 UNITS TABS    Take 1 tablet by mouth daily.    CLOPIDOGREL (PLAVIX) 75 MG TABLET    TAKE 1 TABLET BY MOUTH EVERY DAY   CRANBERRY 500 MG CAPS    Take 500 mg by mouth daily.    CRESTOR 10 MG TABLET    TAKE 1 TABLET BY MOUTH DAILY   DOCUSATE SODIUM (COLACE) 100 MG CAPSULE    Take 100 mg by mouth daily.   FOLIC ACID (FOLVITE) 323 MCG TABLET    Take 800 mcg by mouth daily.    FUROSEMIDE (LASIX) 20 MG TABLET    Take 1 tablet (20 mg total) by mouth daily.   LEVOTHYROXINE (SYNTHROID, LEVOTHROID) 112 MCG TABLET    TAKE 1 TABLET BY MOUTH EVERY DAY   METOPROLOL (LOPRESSOR) 100 MG TABLET    Take 1 tablet (100 mg total) by mouth 2 (two) times daily.   MULTIPLE VITAMIN (MULTIVITAMIN WITH MINERALS) TABS TABLET    Take 1 tablet by mouth daily.   ONE TOUCH ULTRA TEST TEST STRIP       ONETOUCH DELICA LANCETS 55D MISC    by Does not apply route as directed. Use lancets to check blood sugars   PILOCARPINE (SALAGEN) 5 MG TABLET    Take 5 mg by mouth as needed.    PROBIOTIC PRODUCT (PROBIOTIC PO)    Take 1 tablet by mouth daily.   RASAGILINE (AZILECT) 1 MG TABS    Take 1 mg by mouth daily.   [place]    by Does not apply route.   ROTIGOTINE 8 MG/24HR PT24    Place 1 patch onto the skin daily.    SPIRONOLACTONE (ALDACTONE) 25 MG TABLET    Take 1 tablet (25 mg total) by mouth daily.  Modified Medications   No medications on file  Discontinued Medications   No medications on file     Physical Exam: Filed Vitals:   07/08/14 1041  BP: 120/82  Pulse: 84  Temp: 97.6 F (36.4 C)    TempSrc: Oral  Resp: 20  Height: 5\' 1"  (1.549 m)  Weight:  116 lb 3.2 oz (52.708 kg)  SpO2: 93%  Physical Exam  Constitutional: She is oriented to person, place, and time. She appears well-developed and well-nourished. No distress.  Thin white female  Cardiovascular: Normal rate, regular rhythm, normal heart sounds and intact distal pulses.   Pulmonary/Chest: Effort normal and breath sounds normal. No respiratory distress.  Abdominal: Soft. Bowel sounds are normal. She exhibits no distension and no mass. There is no tenderness.  Musculoskeletal: Normal range of motion.  Neurological: She is alert and oriented to person, place, and time. She exhibits abnormal muscle tone.  Tremor of bilateral legs; pt did not remember me from her first visit  Skin: Skin is warm and dry.  Several ecchymotic areas of arms and legs (see hpi)  Psychiatric:  Some masked facies and hypophonia    Labs reviewed: Basic Metabolic Panel:  Recent Labs  12/21/13 1940  12/25/13 0844 12/26/13 0555 03/07/14 1155 04/08/14 1029 05/22/14 0942  NA  --   < > 139 139 139 137  --   K  --   < > 4.0 4.4 4.5 5.3*  --   CL  --   < > 101 103  --  100  --   CO2  --   < > 26 26  --  24  --   GLUCOSE  --   < > 134* 97  --  86  --   BUN  --   < > 31* 32* 29* 23  --   CREATININE  --   < > 0.63 0.68 0.8 0.81  --   CALCIUM  --   < > 9.2 8.8  --  9.6  --   TSH 7.040*  --   --   --   --  5.750* 4.080  < > = values in this interval not displayed. Liver Function Tests:  Recent Labs  12/21/13 2300 12/22/13 0239 12/25/13 0844  AST 56* 58* 67*  ALT 55* 57* 51*  ALKPHOS 135* 136* 128*  BILITOT 0.3 0.4 0.5  PROT 6.7 6.9 6.8  ALBUMIN 2.9* 3.0* 3.2*   No results for input(s): LIPASE, AMYLASE in the last 8760 hours. No results for input(s): AMMONIA in the last 8760 hours. CBC:  Recent Labs  10/18/13 1436 12/21/13 1514  12/24/13 0309 12/26/13 0555 04/08/14 1029  WBC 5.6 4.8  < > 5.0 5.4 7.0  NEUTROABS 3.0 2.6  --    --   --  4.4  HGB 11.2* 11.1*  < > 10.6* 10.1* 11.3  HCT 33.3* 33.9*  < > 32.4* 31.3* 36.7  MCV 97.5 95.5  < > 95.6 98.1 96  PLT 142.0* 85*  < > 107* 126* 130*  < > = values in this interval not displayed. Lipid Panel:  Recent Labs  12/22/13 0239 04/08/14 1029  CHOL 99 114  HDL 41 47  LDLCALC 31 48  TRIG 133 96  CHOLHDL 2.4 2.4   Lab Results  Component Value Date   HGBA1C 6.4* 04/08/2014   Assessment/Plan 1. Type 2 diabetes mellitus without complication - cont diet and exercise--using glucerna now - CBC with Differential/Platelet; Future - Comprehensive metabolic panel; Future - Hemoglobin A1c; Future - Lipid panel; Future  2. Gougerout-Sjoegren syndrome -cont to follow with Dr. [name] for this  3. Idiopathic Parkinson's disease -cont azilect, rotigotine, pilocarpine  4. Hypothyroidism, unspecified hypothyroidism type -cont synthroid current dose and recheck before next appt - TSH; Future  5. Chronic kidney disease (CKD), stage III (  moderate) - stable, avoid nsaids, monitor hydration considering her inadequate po much of the time--at risk for acute renal failure - Comprehensive metabolic panel; Future  6. Loss of weight -has lost less than a lb since last time -cont daily glucerna shakes -says her husband helps take care of her--he's a few years younger  61. Right lower quadrant pain - ? Stone or infection or bladder spasms causing this discomfort - Urinalysis with Reflex Microscopic - Urine culture - Comprehensive metabolic panel; Future   Labs/tests ordered:   Orders Placed This Encounter  Procedures  . Urine culture  . Urinalysis with Reflex Microscopic  . CBC with Differential/Platelet    Standing Status: Future     Number of Occurrences:      Standing Expiration Date: 01/08/2015  . Comprehensive metabolic panel    Standing Status: Future     Number of Occurrences:      Standing Expiration Date: 01/08/2015    Order Specific Question:  Has the  patient fasted?    Answer:  Yes  . Hemoglobin A1c    Standing Status: Future     Number of Occurrences:      Standing Expiration Date: 01/08/2015  . Lipid panel    Standing Status: Future     Number of Occurrences:      Standing Expiration Date: 01/08/2015    Order Specific Question:  Has the patient fasted?    Answer:  Yes  . TSH    Standing Status: Future     Number of Occurrences:      Standing Expiration Date: 01/08/2015    Next appt:  3 mos with labs before   Myley Bahner L. Casaundra Takacs, D.O. [facility] [address], [place] [address] Cell Phone (Mon-Fri 8am-5pm):  [phone] On Call:  [phone] & follow prompts after 5pm & weekends Office Phone:  [phone] Office Fax:  [phone]

## 2014-07-09 LAB — MICROSCOPIC EXAMINATION: WBC, UA: >30 /hpf — AB (ref 0–?)

## 2014-07-09 LAB — URINALYSIS, ROUTINE W REFLEX MICROSCOPIC
Bilirubin, UA: NEGATIVE
Glucose, UA: NEGATIVE
Ketones, UA: NEGATIVE
Nitrite, UA: NEGATIVE
Specific Gravity, UA: 1.014 (ref 1.005–1.030)
Urobilinogen, Ur: 1 mg/dL (ref 0.2–1.0)
pH, UA: 7 (ref 5.0–7.5)

## 2014-07-11 ENCOUNTER — Telehealth: Payer: Self-pay | Admitting: *Deleted

## 2014-07-11 LAB — URINE CULTURE

## 2014-07-11 NOTE — Telephone Encounter (Signed)
I recommend drinking plenty of water, getting enough rest.  She also may take mucinex 600mg  po q12 hrs for congestion and cough.  Warm humidity in the shower or with a humidifier should help, too.

## 2014-07-11 NOTE — Telephone Encounter (Signed)
Patient daughter called and stated that patient has a Sore throat and a lot of congestion with alittle cough. Wants to know what she can start taking so it doesn't get bad. The symptoms have gone through son in law and daughter and now her mother. Would like to know what she can take OTC so it doesn't get bad. Please Advise.

## 2014-07-11 NOTE — Telephone Encounter (Signed)
Victoria Lindsey Notified. Left the message on voicemail as requested.

## 2014-07-12 ENCOUNTER — Telehealth: Payer: Self-pay

## 2014-07-12 MED ORDER — CEPHALEXIN 250 MG PO CAPS: 250.0000 mg | ORAL_CAPSULE | Freq: Four times a day (QID) | ORAL | Status: DC

## 2014-07-12 NOTE — Telephone Encounter (Signed)
-----   Message from [name], DO sent at 07/11/2014  5:05 PM EDT ----- She has allergies to cipro, pcns, sulfa, trimethoprim.  Let's do keflex 250mg  po qid for 7 days for uti.  I am aware that there is a 10% cross reactivity with pcns.

## 2014-07-12 NOTE — Telephone Encounter (Signed)
Patient was on a daily dose of ABX per Urologist- Dr.Eskew. Patient not taking daily dose since November. FYI, daughter would like for me to share this with Dr.Reed.   Patients daughter verbalized understanding of results. RX sent to pharmacy

## 2014-07-12 NOTE — Telephone Encounter (Signed)
Noted.  I wonder if she doesn't have bladder spasms rather than true infection causing her symptoms (they were not typical dysuria, urgency, frequency, but more of a lower abdominal and flank discomfort).  She is likely colonized.  I do not recommend she take prophylactic antibiotics ongoing.  Cranberry capsules can be helpful to prevent uti without any known harmful effects (studies are somewhat equivocal on this topic) and drinking plenty of water.

## 2014-07-25 ENCOUNTER — Telehealth: Payer: Self-pay | Admitting: *Deleted

## 2014-07-25 MED ORDER — CEPHALEXIN 250 MG PO CAPS: ORAL_CAPSULE | ORAL | Status: DC

## 2014-07-25 NOTE — Telephone Encounter (Signed)
Patient caregiver called and stated that since coming off of the antibiotic patients is complaining of burning with urination again and thinks she has another UTI. Please Advise.

## 2014-07-25 NOTE — Telephone Encounter (Signed)
She had been on keflex when she established.  Let's put her back on it nightly as previously ordered.  Inform her daughter.

## 2014-07-25 NOTE — Telephone Encounter (Signed)
Patient daughter notified and Rx faxed to pharmacy.

## 2014-08-22 ENCOUNTER — Other Ambulatory Visit: Payer: Self-pay | Admitting: Cardiology

## 2014-09-02 ENCOUNTER — Other Ambulatory Visit: Payer: Self-pay

## 2014-09-10 ENCOUNTER — Other Ambulatory Visit: Payer: Self-pay | Admitting: Internal Medicine

## 2014-10-07 ENCOUNTER — Other Ambulatory Visit: Payer: Self-pay | Admitting: Cardiology

## 2014-10-08 ENCOUNTER — Other Ambulatory Visit: Payer: Medicare Other

## 2014-10-08 DIAGNOSIS — E039 Hypothyroidism, unspecified: Secondary | ICD-10-CM

## 2014-10-08 DIAGNOSIS — N183 Chronic kidney disease, stage 3 unspecified: Secondary | ICD-10-CM

## 2014-10-08 DIAGNOSIS — R1031 Right lower quadrant pain: Secondary | ICD-10-CM

## 2014-10-08 DIAGNOSIS — E119 Type 2 diabetes mellitus without complications: Secondary | ICD-10-CM

## 2014-10-09 ENCOUNTER — Other Ambulatory Visit: Payer: Medicare Other

## 2014-10-09 LAB — CBC WITH DIFFERENTIAL/PLATELET
Basophils Absolute: 0 10*3/uL (ref 0.0–0.2)
Basos: 0 %
EOS (ABSOLUTE): 0.1 10*3/uL (ref 0.0–0.4)
Eos: 2 %
Hematocrit: 35.9 % (ref 34.0–46.6)
Hemoglobin: 11.1 g/dL (ref 11.1–15.9)
Immature Grans (Abs): 0 10*3/uL (ref 0.0–0.1)
Immature Granulocytes: 0 %
Lymphocytes Absolute: 1.5 10*3/uL (ref 0.7–3.1)
Lymphs: 25 %
MCH: 30.2 pg (ref 26.6–33.0)
MCHC: 30.9 g/dL — ABNORMAL LOW (ref 31.5–35.7)
MCV: 98 fL — ABNORMAL HIGH (ref 79–97)
Monocytes Absolute: 0.9 10*3/uL (ref 0.1–0.9)
Monocytes: 14 %
Neutrophils Absolute: 3.6 10*3/uL (ref 1.4–7.0)
Neutrophils: 59 %
Platelets: 117 10*3/uL — ABNORMAL LOW (ref 150–379)
RBC: 3.67 x10E6/uL — ABNORMAL LOW (ref 3.77–5.28)
RDW: 16.7 % — ABNORMAL HIGH (ref 12.3–15.4)
WBC: 6.2 10*3/uL (ref 3.4–10.8)

## 2014-10-09 LAB — COMPREHENSIVE METABOLIC PANEL
ALT: 42 IU/L — ABNORMAL HIGH (ref 0–32)
AST: 44 IU/L — ABNORMAL HIGH (ref 0–40)
Albumin/Globulin Ratio: 1.4 (ref 1.1–2.5)
Albumin: 3.9 g/dL (ref 3.5–4.7)
Alkaline Phosphatase: 127 IU/L — ABNORMAL HIGH (ref 39–117)
BUN/Creatinine Ratio: 30 — ABNORMAL HIGH (ref 11–26)
BUN: 34 mg/dL — ABNORMAL HIGH (ref 8–27)
Bilirubin Total: 0.3 mg/dL (ref 0.0–1.2)
CO2: 23 mmol/L (ref 18–29)
Calcium: 8.9 mg/dL (ref 8.7–10.3)
Chloride: 100 mmol/L (ref 97–108)
Creatinine, Ser: 1.12 mg/dL — ABNORMAL HIGH (ref 0.57–1.00)
GFR calc Af Amer: 51 mL/min/{1.73_m2} — ABNORMAL LOW (ref >59)
GFR calc non Af Amer: 45 mL/min/{1.73_m2} — ABNORMAL LOW (ref >59)
Globulin, Total: 2.8 g/dL (ref 1.5–4.5)
Glucose: 92 mg/dL (ref 65–99)
Potassium: 4.7 mmol/L (ref 3.5–5.2)
Sodium: 142 mmol/L (ref 134–144)
Total Protein: 6.7 g/dL (ref 6.0–8.5)

## 2014-10-09 LAB — LIPID PANEL
Chol/HDL Ratio: 2.6 ratio units (ref 0.0–4.4)
Cholesterol, Total: 106 mg/dL (ref 100–199)
HDL: 41 mg/dL (ref >39)
LDL Calculated: 49 mg/dL (ref 0–99)
Triglycerides: 81 mg/dL (ref 0–149)
VLDL Cholesterol Cal: 16 mg/dL (ref 5–40)

## 2014-10-09 LAB — TSH: TSH: 5.22 u[IU]/mL — ABNORMAL HIGH (ref 0.450–4.500)

## 2014-10-09 LAB — HEMOGLOBIN A1C
Est. average glucose Bld gHb Est-mCnc: 140 mg/dL
Hgb A1c MFr Bld: 6.5 % — ABNORMAL HIGH (ref 4.8–5.6)

## 2014-10-11 ENCOUNTER — Ambulatory Visit (INDEPENDENT_AMBULATORY_CARE_PROVIDER_SITE_OTHER): Payer: Medicare Other | Admitting: Internal Medicine

## 2014-10-11 ENCOUNTER — Encounter: Payer: Self-pay | Admitting: Internal Medicine

## 2014-10-11 VITALS — BP 136/78 | HR 80 | Temp 97.5°F | Resp 20 | Ht 61.0 in | Wt 117.2 lb

## 2014-10-11 DIAGNOSIS — G20A1 Parkinson's disease without dyskinesia, without mention of fluctuations: Secondary | ICD-10-CM

## 2014-10-11 DIAGNOSIS — M3500 Sicca syndrome, unspecified: Secondary | ICD-10-CM

## 2014-10-11 DIAGNOSIS — N390 Urinary tract infection, site not specified: Secondary | ICD-10-CM

## 2014-10-11 DIAGNOSIS — E039 Hypothyroidism, unspecified: Secondary | ICD-10-CM

## 2014-10-11 DIAGNOSIS — K1379 Other lesions of oral mucosa: Secondary | ICD-10-CM | POA: Diagnosis not present

## 2014-10-11 DIAGNOSIS — M85631 Other cyst of bone, right forearm: Secondary | ICD-10-CM | POA: Diagnosis not present

## 2014-10-11 DIAGNOSIS — N183 Chronic kidney disease, stage 3 unspecified: Secondary | ICD-10-CM

## 2014-10-11 DIAGNOSIS — G20 Parkinson's disease: Secondary | ICD-10-CM | POA: Diagnosis not present

## 2014-10-11 DIAGNOSIS — D696 Thrombocytopenia, unspecified: Secondary | ICD-10-CM

## 2014-10-11 DIAGNOSIS — D692 Other nonthrombocytopenic purpura: Secondary | ICD-10-CM | POA: Diagnosis not present

## 2014-10-11 DIAGNOSIS — E119 Type 2 diabetes mellitus without complications: Secondary | ICD-10-CM

## 2014-10-11 LAB — POCT URINALYSIS DIPSTICK
Bilirubin, UA: NEGATIVE
Glucose, UA: NEGATIVE
Ketones, UA: NEGATIVE
Nitrite, UA: POSITIVE
Protein, UA: NEGATIVE
Spec Grav, UA: 1.01
Urobilinogen, UA: 0.2
pH, UA: 6

## 2014-10-11 NOTE — Patient Instructions (Addendum)
Increase your water intake to 6 8oz glasses of water per day.   We will recheck your kidney function test in 6 weeks. Continue your gabapentin 100mg  twice daily.  Your urine dipstick was positive so we will send it off for culture.  Try to cut back on sweets a bit to lower your sugar average.

## 2014-10-11 NOTE — Progress Notes (Signed)
Patient ID: Victoria Lindsey, female   DOB: Jul 16, 1925, 81 y.o.   MRN: [id number]   Location:  [facility] / [facility]  Code Status:DNR Goals of Care: Advanced Directive information Does patient have an advance directive?: Yes, Type of Advance Directive: [place];Living will;Out of facility DNR (pink MOST or yellow form), Pre-existing out of facility DNR order (yellow form or pink MOST form): Yellow form placed in chart (order not valid for inpatient use), Does patient want to make changes to advanced directive?: No - Patient declined   Chief Complaint  Patient presents with  . Medical Management of Chronic Issues    3 month follow-up    HPI: Patient is a 81 y.o. white female seen in the office today for medical management of chronic diseases.  She is accompanied by her daughter.  She says she is doing ok.   Stinging and burning in lower back.  Was put on gabapentin 100mg  daily to three times daily.   Has senile purpura.  C/o small mysterious bruises on arms and legs that are purple.  She does not recall bumping herself.  Cyst on back of right forearm--it is mobile.  Nontender.  5-6x over mos has woken up with blood in her mouth.  Does not remember biting gums or tongue.  Uses xylemelt lozenges for dry mouth and has salagen pills but hasn't taken them for several months.  Drools when goes to sleep but mouth is dry inside.  Says it's like dough with blood in it.  It's thick and says it will be between her teeth.  Rinses it out and she's fine.  Sees hematologist re: low platelets and 117 this time.     Reviewed labs with her and her daughter.  Gabapentin is the only new med since February.  GFR has declined significantly from 75 to 45.    Also we restarted the keflex in May.  Does not drink much water due to bloating.  Likes chocolate milk.  Says she drinks 2 glasses of water per day.    Sugar average is trending up.  Says she's been eating  more desserts, sausage biscuits, candy.  Sometimes eats pancakes.  Says she'll do well for a while and then cheat.  Had her diabetic foot exam Tuesday by podiatry--added to health maintenance.    Has a follow up with therapy next week to see where she is with her speech, pt, ot.  Moving well--walking well with her rollator walker.   Review of Systems:  Review of Systems  Constitutional: Negative for fever, chills and malaise/fatigue.  HENT: Positive for hearing loss.   Eyes: Negative for blurred vision.       Glasses  Respiratory: Negative for shortness of breath.   Cardiovascular: Negative for chest pain, palpitations and leg swelling.  Gastrointestinal: Negative for abdominal pain, blood in stool and melena.  Genitourinary: Negative for dysuria.  Musculoskeletal: Positive for back pain. Negative for falls.       Gait unsteady but improved with therapy, uses rollator walker  Skin:       purple spots  Neurological: Positive for tingling, tremors and sensory change. Negative for dizziness, loss of consciousness, weakness and headaches.  Psychiatric/Behavioral: Positive for memory loss. Negative for depression. The patient is not nervous/anxious and does not have insomnia.     Past Medical History  Diagnosis Date  . Hypertension   . Parkinson's disease   . Hyperlipidemia   . Dizziness   .  Orthostatic hypotension   . Anemia   . Tremor   . Breast cancer     LEFT BREAST  . Kidney failure     stage 3  . Hypothyroidism   . Prediabetes   . TIA (transient ischemic attack)   . Hyperglycemia   . Left ventricular diastolic dysfunction   . Hypophonia     Past Surgical History  Procedure Laterality Date  . Cardiovascular stress test  06/2007    NORMAL  . Breast lumpectomy  2010  . Cholecystectomy  1994  . Tonsillectomy    . Nasal sinus surgery      submucous resection late 1950s  . Cataract extraction  2005,2006    Dr.Devonzo    Allergies  Allergen Reactions  . Aciphex  [Rabeprazole Sodium]   . Amantadines   . Avapro [Irbesartan]   . Carbidopa-Levodopa   . Cardura [Doxazosin Mesylate]   . Ciprofloxacin   . Clonidine Derivatives   . Cozaar   . Diltiazem   . Famotidine   . Hctz [Hydrochlorothiazide]   . Indapamide   . Lansoprazole   . Lisinopril     Mouth problems  . Loratadine   . Maxidex [Dexamethasone]   . Mirapex [Pramipexole Dihydrochloride]   . Norvasc [Amlodipine Besylate]   . Pantoprazole Sodium   . Penicillins   . Prilosec [Omeprazole]   . Procardia [Nifedipine]   . Proton Pump Inhibitors   . Sulfa Drugs Cross Reactors   . Trimethoprim   . Zyrtec [Cetirizine Hcl]    Medications: Patient's Medications  New Prescriptions   DOXYCYCLINE (VIBRA-TABS) 100 MG TABLET    Take one tablet twice daily for urinary infection  Previous Medications   AMBULATORY NON FORMULARY MEDICATION    Outpatient Speech Therapy, evaluate and treat.  DX: G20, R49.8   ASPIRIN 81 MG TABLET    Take 81 mg by mouth every evening.    BIOTIN PO    Take 1,000 mcg by mouth daily.    CEPHALEXIN (KEFLEX) 250 MG CAPSULE    Take one capsule once daily at bedtime to prevent UTI   CHOLECALCIFEROL (VITAMIN D3) 2000 UNITS TABS    Take 1 tablet by mouth daily.    CLOPIDOGREL (PLAVIX) 75 MG TABLET    TAKE 1 TABLET BY MOUTH EVERY DAY   CRANBERRY 500 MG CAPS    Take 500 mg by mouth daily.    CRESTOR 10 MG TABLET    TAKE 1 TABLET BY MOUTH DAILY   DOCUSATE SODIUM (COLACE) 100 MG CAPSULE    Take 100 mg by mouth daily.   FOLIC ACID (FOLVITE) 751 MCG TABLET    Take 800 mcg by mouth daily.    FUROSEMIDE (LASIX) 20 MG TABLET    TAKE 1 TABLET BY MOUTH DAILY   GABAPENTIN (NEURONTIN) 100 MG CAPSULE    Take 100 mg by mouth 3 (three) times daily.   METOPROLOL (LOPRESSOR) 100 MG TABLET    Take 1 tablet (100 mg total) by mouth 2 (two) times daily.   MULTIPLE VITAMIN (MULTIVITAMIN WITH MINERALS) TABS TABLET    Take 1 tablet by mouth daily.   ONE TOUCH ULTRA TEST TEST STRIP       ONETOUCH  DELICA LANCETS 02H MISC    by Does not apply route as directed. Use lancets to check blood sugars   PILOCARPINE (SALAGEN) 5 MG TABLET    Take 5 mg by mouth as needed.    PROBIOTIC PRODUCT (PROBIOTIC PO)    Take  1 tablet by mouth daily.   RASAGILINE (AZILECT) 1 MG TABS    Take 1 mg by mouth daily.   [place]    by Does not apply route.   ROTIGOTINE 8 MG/24HR PT24    Place 1 patch onto the skin daily.    SPIRONOLACTONE (ALDACTONE) 25 MG TABLET    Take 1 tablet (25 mg total) by mouth daily.   SYNTHROID 112 MCG TABLET    TAKE 1 TABLET BY MOUTH EVERY DAY  Modified Medications   No medications on file  Discontinued Medications   No medications on file    Physical Exam: Filed Vitals:   10/11/14 1058  BP: 136/78  Pulse: 80  Temp: 97.5 F (36.4 C)  TempSrc: Oral  Resp: 20  Height: 5\' 1"  (1.549 m)  Weight: 117 lb 3.2 oz (53.162 kg)  SpO2: 96%   Physical Exam  Constitutional: She is oriented to person, place, and time. She appears well-developed and well-nourished. No distress.  White female seated in chair, uses rollator to ambulate  HENT:  Head: Normocephalic and atraumatic.  Cardiovascular: Normal rate, regular rhythm, normal heart sounds and intact distal pulses.   Pulmonary/Chest: Effort normal and breath sounds normal.  Abdominal: Soft. Bowel sounds are normal.  Musculoskeletal: Normal range of motion.  Stooped posture, shuffling gait  Neurological: She is alert and oriented to person, place, and time.  Skin: Skin is warm and dry. There is pallor.  Purple spots on arms and legs     Labs reviewed: Basic Metabolic Panel:  Recent Labs  12/26/13 0555 03/07/14 1155 04/08/14 1029 05/22/14 0942 10/08/14 0832  NA 139 139 137  --  142  K 4.4 4.5 5.3*  --  4.7  CL 103  --  100  --  100  CO2 26  --  24  --  23  GLUCOSE 97  --  86  --  92  BUN 32* 29* 23  --  34*  CREATININE 0.68 0.8 0.81  --  1.12*  CALCIUM 8.8  --  9.6  --  8.9  TSH  --   --  5.750*  4.080 5.220*   Liver Function Tests:  Recent Labs  12/21/13 2300 12/22/13 0239 12/25/13 0844 10/08/14 0832  AST 56* 58* 67* 44*  ALT 55* 57* 51* 42*  ALKPHOS 135* 136* 128* 127*  BILITOT 0.3 0.4 0.5 0.3  PROT 6.7 6.9 6.8 6.7  ALBUMIN 2.9* 3.0* 3.2*  --    No results for input(s): LIPASE, AMYLASE in the last 8760 hours. No results for input(s): AMMONIA in the last 8760 hours. CBC:  Recent Labs  12/21/13 1514  12/24/13 0309 12/26/13 0555 04/08/14 1029 10/08/14 0832  WBC 4.8  < > 5.0 5.4 7.0 6.2  NEUTROABS 2.6  --   --   --  4.4 3.6  HGB 11.1*  < > 10.6* 10.1* 11.3  --   HCT 33.9*  < > 32.4* 31.3* 36.7 35.9  MCV 95.5  < > 95.6 98.1 96  --   PLT 85*  < > 107* 126* 130*  --   < > = values in this interval not displayed. Lipid Panel:  Recent Labs  12/22/13 0239 04/08/14 1029 10/08/14 0832  CHOL 99 114 106  HDL 41 47 41  LDLCALC 31 48 49  TRIG 133 96 81  CHOLHDL 2.4 2.4 2.6   Lab Results  Component Value Date   HGBA1C 6.5* 10/08/2014  Assessment/Plan 1. Type 2 diabetes mellitus without complication - glucose was trending up -had foot exam at podiatrist -discussed dietary items that might be cause and she will watch them more closely -f/u labs again next time - Microalbumin/Creatinine Ratio, Urine - CBC with Differential/Platelet; Future - Comprehensive metabolic panel; Future - Lipid panel; Future - Hemoglobin A1c; Future  2. Gougerout-Sjoegren syndrome -cont lozenges and sprays for dry mouth -f/u with rheum -suspect mouth dryness and peeling may be cause of "blood in her mouth"   3. Idiopathic Parkinson's disease -doing better since working with PT, OT, ST, walking better with her rollator walker  4. Hypothyroidism, unspecified hypothyroidism type - cont current synthroid and reassess next time - TSH; Future  5. Chronic kidney disease (CKD), stage III (moderate) -worsened on labs -suspect due to poor hydration, but may also be related to  resuming keflex prophylactic abx (b/c she had recurrences of UTIs) -increase hydration to 6 8oz glasses of water per day, cont to also drink the milk which is good protein -reassess before next visit - Basic metabolic panel; Future - Microalbumin/Creatinine Ratio, Urine - CBC with Differential/Platelet; Future - Comprehensive metabolic panel; Future  6. Frequent UTI - reassess due to ongoing use of keflex so that we can determine if it's truly beneficial - POCT urinalysis dipstick - Urine culture  7. Other cyst of bone, right forearm -I suspect this is on a tendon or nerve sheath not a bony cyst, but only code was for cyst of bone when on right forearm -advised we'd monitor this as it is not painful, is mobile and soft  8. Blood in mouth of unknown source -suspect due to her Sjogren's and peeling of pieces of her inner gums, but could also be coming from nose bleeds (denies these; however) -is on asa and plavix and has some degree of thrombocytopenia, follows with hematology  9. Thrombocytopenia -levels should not cause bleeding in current range  10.  Senile purpura: Noted on her arms and legs due to anticoagulation and fragile thin, geriatric skin  Labs/tests ordered: Orders Placed This Encounter  Procedures  . Urine culture  . Basic metabolic panel    Standing Status: Future     Number of Occurrences:      Standing Expiration Date: 01/03/2015    Order Specific Question:  Has the patient fasted?    Answer:  No  . Microalbumin/Creatinine Ratio, Urine  . CBC with Differential/Platelet    Standing Status: Future     Number of Occurrences:      Standing Expiration Date: 04/13/2015  . Comprehensive metabolic panel    Standing Status: Future     Number of Occurrences:      Standing Expiration Date: 04/13/2015    Order Specific Question:  Has the patient fasted?    Answer:  Yes  . Lipid panel    Standing Status: Future     Number of Occurrences:      Standing Expiration Date:  04/13/2015    Order Specific Question:  Has the patient fasted?    Answer:  Yes  . Hemoglobin A1c    Standing Status: Future     Number of Occurrences:      Standing Expiration Date: 04/13/2015  . TSH    Standing Status: Future     Number of Occurrences:      Standing Expiration Date: 04/13/2015  . POCT urinalysis dipstick    Next appt:  3 mos for med mgt, labs before  Ting Cage L.  Latice Waitman, D.O. [facility] [address], [place] [address] Cell Phone (Mon-Fri 8am-5pm):  [phone] On Call:  [phone] & follow prompts after 5pm & weekends Office Phone:  [phone] Office Fax:  [phone]

## 2014-10-12 LAB — MICROALBUMIN / CREATININE URINE RATIO
Creatinine, Urine: 64.1 mg/dL
MICROALB/CREAT RATIO: 133.7 mg/g creat — ABNORMAL HIGH (ref 0.0–30.0)
Microalbumin, Urine: 85.7 ug/mL

## 2014-10-13 LAB — URINE CULTURE

## 2014-10-14 ENCOUNTER — Telehealth: Payer: Self-pay | Admitting: Cardiology

## 2014-10-14 ENCOUNTER — Other Ambulatory Visit: Payer: Self-pay

## 2014-10-14 MED ORDER — DOXYCYCLINE HYCLATE 100 MG PO TABS: ORAL_TABLET | ORAL | Status: DC

## 2014-10-14 NOTE — Telephone Encounter (Signed)
The chart notes "mouth problems" with lisinopril.  It does not state what happened when she took ARB.

## 2014-10-14 NOTE — Telephone Encounter (Signed)
Patient was seen by Dr Morton Peters last week and she is wanting her to be on an ACE inhibitor  Will forward to  Dr. Mare Ferrari for review

## 2014-10-14 NOTE — Telephone Encounter (Signed)
Fax Doxycycline to [facility] per Dr. Magdalene Molly orders on urine culture

## 2014-10-14 NOTE — Telephone Encounter (Signed)
It depends what "mouth problems" means.  If it means tongue swelling or symptoms of angioneurotic edema would not want to use it. I would be in favor of not using the lisinopril since she has had problems with it in the past.

## 2014-10-14 NOTE — Telephone Encounter (Signed)
New message      PCP want to change blood pressure medications.  Why can't pt take cozaar and lisinopril?  They want [place] to look at our records and tell them why she could not take these medications---pt said it is in her chart what she cannot take these medications.

## 2014-10-22 ENCOUNTER — Telehealth: Payer: Self-pay | Admitting: *Deleted

## 2014-10-22 NOTE — Telephone Encounter (Signed)
Advised daughter 

## 2014-10-22 NOTE — Telephone Encounter (Signed)
Ok, I see the notes.  Sounds like no one knows what mouth problems really meant so we will have to avoid all ace/arbs.

## 2014-10-22 NOTE — Telephone Encounter (Signed)
Left message to call back  

## 2014-10-22 NOTE — Telephone Encounter (Signed)
Daughter, [name] came by the office and left message for Dr. Mariea Clonts stating that she just got off the phone with Dr. Sherryl Barters office and they researched the reason Cozaar and ACE inhibitors were on patient's chart as things she could not take. Stated that Dr. Mare Ferrari has written information in patient's chart that you can access the reason. She just wanted to let you know.

## 2014-10-31 ENCOUNTER — Other Ambulatory Visit: Payer: Self-pay | Admitting: Cardiology

## 2014-11-12 ENCOUNTER — Encounter: Payer: Self-pay | Admitting: Nurse Practitioner

## 2014-11-12 ENCOUNTER — Ambulatory Visit (INDEPENDENT_AMBULATORY_CARE_PROVIDER_SITE_OTHER): Payer: Medicare Other | Admitting: Nurse Practitioner

## 2014-11-12 VITALS — BP 158/72 | HR 83 | Temp 97.5°F | Resp 20 | Ht 61.0 in | Wt 118.0 lb

## 2014-11-12 DIAGNOSIS — R300 Dysuria: Secondary | ICD-10-CM

## 2014-11-12 NOTE — Progress Notes (Signed)
Patient ID: Victoria Lindsey, female   DOB: [birth year], 81 y.o.   MRN: [id number]    PCP: Hollace Kinnier, DO  Allergies  Allergen Reactions  . Aciphex [Rabeprazole Sodium]   . Amantadines   . Avapro [Irbesartan]   . Carbidopa-Levodopa   . Cardura [Doxazosin Mesylate]   . Ciprofloxacin   . Clonidine Derivatives   . Cozaar     nightmares  . Diltiazem   . Famotidine   . Hctz [Hydrochlorothiazide]   . Indapamide   . Lansoprazole   . Lisinopril     Mouth problems  . Loratadine   . Maxidex [Dexamethasone]   . Mirapex [Pramipexole Dihydrochloride]   . Norvasc [Amlodipine Besylate]   . Pantoprazole Sodium   . Penicillins   . Prilosec [Omeprazole]   . Procardia [Nifedipine]   . Proton Pump Inhibitors   . Sulfa Drugs Cross Reactors   . Trimethoprim   . Zyrtec [Cetirizine Hcl]     Chief Complaint  Patient presents with  . Acute Visit    burning, pain before urination     HPI: Patient is a 81 y.o. female seen in the office today for possible UTI. Pt with hx of UTI, pt was daily keflex and cranberry. Pt was recently seen and treated for UTI last month and treated with doxycyline. Not currently taking keflex. Dysuria has been off and on since before last month when she was treated with doxycyline. No fevers or chills. Skin to vaginal area very fragile, causing irritation.  No abdominal pain Probably not drinking enough water.  Review of Systems:  Review of Systems  Constitutional: Negative for fever, chills, activity change, appetite change and fatigue.  Respiratory: Negative for shortness of breath.   Cardiovascular: Negative for chest pain.  Gastrointestinal: Negative for abdominal pain.  Genitourinary: Positive for dysuria and vaginal pain (discomfort). Negative for urgency, decreased urine volume, vaginal discharge (discomfort) and difficulty urinating.    Past Medical History  Diagnosis Date  . Hypertension   . Parkinson's disease   . Hyperlipidemia   . Dizziness   .  Orthostatic hypotension   . Anemia   . Tremor   . Breast cancer     LEFT BREAST  . Kidney failure     stage 3  . Hypothyroidism   . Prediabetes   . TIA (transient ischemic attack)   . Hyperglycemia   . Left ventricular diastolic dysfunction   . Hypophonia    Past Surgical History  Procedure Laterality Date  . Cardiovascular stress test  06/2007    NORMAL  . Breast lumpectomy  2010  . Cholecystectomy  1994  . Tonsillectomy    . Nasal sinus surgery      submucous resection late 1950s  . Cataract extraction  2005,2006    Dr.Devonzo   Social History:   reports that she quit smoking about 50 years ago. Her smoking use included Cigarettes. She has a 6 pack-year smoking history. She has never used smokeless tobacco. She reports that she does not drink alcohol or use illicit drugs.  Family History  Problem Relation Age of Onset  . Heart disease Mother   . Heart failure Father   . Parkinsonism Father   . COPD Sister   . Cancer Brother   . Osteoporosis Daughter   . Diabetes Father   . Stroke Sister     Medications: Patient's Medications  New Prescriptions   No medications on file  Previous Medications   AMBULATORY NON FORMULARY MEDICATION  Outpatient Speech Therapy, evaluate and treat.  DX: G20, R49.8   ASPIRIN 81 MG TABLET    Take 81 mg by mouth every evening.    BIOTIN PO    Take 1,000 mcg by mouth daily.    CEPHALEXIN (KEFLEX) 250 MG CAPSULE    Take one capsule once daily at bedtime to prevent UTI   CHOLECALCIFEROL (VITAMIN D3) 2000 UNITS TABS    Take 1 tablet by mouth daily.    CLOPIDOGREL (PLAVIX) 75 MG TABLET    TAKE 1 TABLET BY MOUTH EVERY DAY   CRANBERRY 500 MG CAPS    Take 500 mg by mouth daily.    CRESTOR 10 MG TABLET    TAKE 1 TABLET BY MOUTH DAILY   DOCUSATE SODIUM (COLACE) 100 MG CAPSULE    Take 100 mg by mouth daily.   DOXYCYCLINE (VIBRA-TABS) 100 MG TABLET    Take one tablet twice daily for urinary infection   FOLIC ACID (FOLVITE) 119 MCG TABLET    Take  800 mcg by mouth daily.    FUROSEMIDE (LASIX) 20 MG TABLET    TAKE 1 TABLET BY MOUTH DAILY   GABAPENTIN (NEURONTIN) 100 MG CAPSULE    Take 100 mg by mouth 3 (three) times daily.   METOPROLOL (LOPRESSOR) 100 MG TABLET    Take 1 tablet (100 mg total) by mouth 2 (two) times daily.   MULTIPLE VITAMIN (MULTIVITAMIN WITH MINERALS) TABS TABLET    Take 1 tablet by mouth daily.   ONE TOUCH ULTRA TEST TEST STRIP       ONETOUCH DELICA LANCETS 41D MISC    by Does not apply route as directed. Use lancets to check blood sugars   PILOCARPINE (SALAGEN) 5 MG TABLET    Take 5 mg by mouth as needed.    PROBIOTIC PRODUCT (PROBIOTIC PO)    Take 1 tablet by mouth daily.   RASAGILINE (AZILECT) 1 MG TABS    Take 1 mg by mouth daily.   [place]    by Does not apply route.   ROTIGOTINE 8 MG/24HR PT24    Place 1 patch onto the skin daily.    SPIRONOLACTONE (ALDACTONE) 25 MG TABLET    Take 1 tablet (25 mg total) by mouth daily.   SYNTHROID 112 MCG TABLET    TAKE 1 TABLET BY MOUTH EVERY DAY  Modified Medications   No medications on file  Discontinued Medications   No medications on file     Physical Exam:  Filed Vitals:   11/12/14 1630  Pulse: 83  Temp: 97.5 F (36.4 C)  TempSrc: Oral  Resp: 20  Height: 5\' 1"  (1.549 m)  Weight: 118 lb (53.524 kg)  SpO2: 95%    Physical Exam  Constitutional: She is oriented to person, place, and time. No distress.  Frail elderly female  Cardiovascular: Normal rate, regular rhythm and normal heart sounds.   Pulmonary/Chest: Effort normal and breath sounds normal.  Abdominal: Soft. Bowel sounds are normal. She exhibits no distension. There is no tenderness.  Genitourinary: Vagina normal.  Neurological: She is alert and oriented to person, place, and time.  Skin: Skin is warm and dry.    Labs reviewed: Basic Metabolic Panel:  Recent Labs  12/26/13 0555 03/07/14 1155 04/08/14 1029 05/22/14 0942 10/08/14 0832  NA 139 139 137  --  142  K  4.4 4.5 5.3*  --  4.7  CL 103  --  100  --  100  CO2  26  --  24  --  23  GLUCOSE 97  --  86  --  92  BUN 32* 29* 23  --  34*  CREATININE 0.68 0.8 0.81  --  1.12*  CALCIUM 8.8  --  9.6  --  8.9  TSH  --   --  5.750* 4.080 5.220*   Liver Function Tests:  Recent Labs  12/21/13 2300 12/22/13 0239 12/25/13 0844 10/08/14 0832  AST 56* 58* 67* 44*  ALT 55* 57* 51* 42*  ALKPHOS 135* 136* 128* 127*  BILITOT 0.3 0.4 0.5 0.3  PROT 6.7 6.9 6.8 6.7  ALBUMIN 2.9* 3.0* 3.2*  --    No results for input(s): LIPASE, AMYLASE in the last 8760 hours. No results for input(s): AMMONIA in the last 8760 hours. CBC:  Recent Labs  12/21/13 1514  12/24/13 0309 12/26/13 0555 04/08/14 1029 10/08/14 0832  WBC 4.8  < > 5.0 5.4 7.0 6.2  NEUTROABS 2.6  --   --   --  4.4 3.6  HGB 11.1*  < > 10.6* 10.1* 11.3  --   HCT 33.9*  < > 32.4* 31.3* 36.7 35.9  MCV 95.5  < > 95.6 98.1 96  --   PLT 85*  < > 107* 126* 130*  --   < > = values in this interval not displayed. Lipid Panel:  Recent Labs  12/22/13 0239 04/08/14 1029 10/08/14 0832  CHOL 99 114 106  HDL 41 47 41  LDLCALC 31 48 49  TRIG 133 96 81  CHOLHDL 2.4 2.4 2.6   TSH:  Recent Labs  04/08/14 1029 05/22/14 0942 10/08/14 0832  TSH 5.750* 4.080 5.220*   A1C: Lab Results  Component Value Date   HGBA1C 6.5* 10/08/2014     Assessment/Plan 1. Dysuria - Urinalysis and Culture, Urine obtained via I&O cath, will send off for evaluation -encouraged to drink more fluids -may use lubricate such as ky jelly to vaginal area to help with irritation if she is having discomfort. -to keep follow up appt with Dr Mariea Clonts, sooner if needed   [place] K. Harle Battiest  [facility] [phone] 8 am - 5 pm) [phone] (after hours)

## 2014-11-12 NOTE — Patient Instructions (Signed)
We will call you with the results May use Ky jelly to help lubricate vaginal area Keep follow up appt

## 2014-11-13 ENCOUNTER — Encounter: Payer: Self-pay | Admitting: Internal Medicine

## 2014-11-13 LAB — URINALYSIS
Bilirubin, UA: NEGATIVE
Glucose, UA: NEGATIVE
Ketones, UA: NEGATIVE
NITRITE UA: NEGATIVE
PH UA: 7 (ref 5.0–7.5)
Specific Gravity, UA: 1.013 (ref 1.005–1.030)
Urobilinogen, Ur: 0.2 mg/dL (ref 0.2–1.0)

## 2014-11-14 LAB — URINE CULTURE: Organism ID, Bacteria: NO GROWTH

## 2014-11-20 ENCOUNTER — Other Ambulatory Visit: Payer: Self-pay | Admitting: Internal Medicine

## 2014-11-22 ENCOUNTER — Other Ambulatory Visit: Payer: Medicare Other

## 2014-11-22 DIAGNOSIS — L820 Inflamed seborrheic keratosis: Secondary | ICD-10-CM | POA: Diagnosis not present

## 2014-11-22 DIAGNOSIS — D225 Melanocytic nevi of trunk: Secondary | ICD-10-CM | POA: Diagnosis not present

## 2014-11-22 DIAGNOSIS — L720 Epidermal cyst: Secondary | ICD-10-CM | POA: Diagnosis not present

## 2014-11-22 DIAGNOSIS — L821 Other seborrheic keratosis: Secondary | ICD-10-CM | POA: Diagnosis not present

## 2014-11-25 ENCOUNTER — Ambulatory Visit (INDEPENDENT_AMBULATORY_CARE_PROVIDER_SITE_OTHER): Payer: Medicare Other | Admitting: Internal Medicine

## 2014-11-25 ENCOUNTER — Encounter: Payer: Self-pay | Admitting: Internal Medicine

## 2014-11-25 VITALS — BP 140/72 | HR 79 | Temp 97.3°F | Resp 20 | Ht 61.0 in | Wt 116.4 lb

## 2014-11-25 DIAGNOSIS — Z23 Encounter for immunization: Secondary | ICD-10-CM | POA: Diagnosis not present

## 2014-11-25 DIAGNOSIS — D696 Thrombocytopenia, unspecified: Secondary | ICD-10-CM

## 2014-11-25 DIAGNOSIS — K055 Other periodontal diseases: Secondary | ICD-10-CM | POA: Diagnosis not present

## 2014-11-25 DIAGNOSIS — K068 Other specified disorders of gingiva and edentulous alveolar ridge: Secondary | ICD-10-CM

## 2014-11-25 NOTE — Progress Notes (Signed)
Patient ID: Victoria Lindsey, female   DOB: [birth year], 81 y.o.   MRN: [id number]   Location:  [facility]  Code Status: DNR Goals of Care: Advanced Directive information Does patient have an advance directive?: Yes   Chief Complaint  Patient presents with  . Acute Visit    Is bleeding from the mouth when she sleeps at night says it started about 6 months ago not every night  but only 1-2 times a month  . Immunizations    Discuss getting flu shot today    HPI: Patient is a 81 y.o. white female seen in the office today for an acute visit and her flu shot.  Brought her pillowcase with blood on it and a glass bottle with some blood in it.  Roof of mouth peels.  Does have some low platelets but not <50K.  Is on plavix.  Happens only 2x per month.  Does bite her tongue at times.  Says she doesn't know it if she does it at night.    Flu shot due today--agrees to it.    Review of Systems:  Review of Systems  Constitutional: Negative for fever, chills and malaise/fatigue.  HENT: Negative for congestion.        Chronically sore mouth  Eyes:       Glasses  Respiratory: Negative for shortness of breath.   Cardiovascular: Negative for chest pain and leg swelling.  Gastrointestinal: Negative for abdominal pain, blood in stool and melena.  Genitourinary: Negative for dysuria.  Musculoskeletal: Negative for falls.       Balance is worsening  Neurological: Negative for dizziness, loss of consciousness and weakness.  Endo/Heme/Allergies: Bruises/bleeds easily.  Psychiatric/Behavioral: Positive for memory loss.    Past Medical History  Diagnosis Date  . Hypertension   . Parkinson's disease   . Hyperlipidemia   . Dizziness   . Orthostatic hypotension   . Anemia   . Tremor   . Breast cancer     LEFT BREAST  . Kidney failure     stage 3  . Hypothyroidism   . Prediabetes   . TIA (transient ischemic attack)   . Hyperglycemia   . Left  ventricular diastolic dysfunction   . Hypophonia     Past Surgical History  Procedure Laterality Date  . Cardiovascular stress test  06/2007    NORMAL  . Breast lumpectomy  2010  . Cholecystectomy  1994  . Tonsillectomy    . Nasal sinus surgery      submucous resection late 1950s  . Cataract extraction  2005,2006    Dr.Devonzo    Allergies  Allergen Reactions  . Aciphex [Rabeprazole Sodium]   . Amantadines   . Avapro [Irbesartan]   . Carbidopa-Levodopa   . Cardura [Doxazosin Mesylate]   . Ciprofloxacin   . Clonidine Derivatives   . Cozaar     nightmares  . Diltiazem   . Famotidine   . Hctz [Hydrochlorothiazide]   . Indapamide   . Lansoprazole   . Lisinopril     Mouth problems  . Loratadine   . Maxidex [Dexamethasone]   . Mirapex [Pramipexole Dihydrochloride]   . Norvasc [Amlodipine Besylate]   . Pantoprazole Sodium   . Penicillins   . Prilosec [Omeprazole]   . Procardia [Nifedipine]   . Proton Pump Inhibitors   . Sulfa Drugs Cross Reactors   . Trimethoprim   . Zyrtec [Cetirizine Hcl]    Medications: Patient's Medications  New  Prescriptions   No medications on file  Previous Medications   AMBULATORY NON FORMULARY MEDICATION    Outpatient Speech Therapy, evaluate and treat.  DX: G20, R49.8   ASPIRIN 81 MG TABLET    Take 81 mg by mouth every evening.    BIOTIN PO    Take 1,000 mcg by mouth daily.    CEPHALEXIN (KEFLEX) 250 MG CAPSULE    TAKE ONE CAPSULE BY MOUTH EVERY NIGHT AT BEDTIME TO PREVENT UTI   CHOLECALCIFEROL (VITAMIN D3) 2000 UNITS TABS    Take 1 tablet by mouth daily.    CLOPIDOGREL (PLAVIX) 75 MG TABLET    TAKE 1 TABLET BY MOUTH EVERY DAY   CRANBERRY 500 MG CAPS    Take 500 mg by mouth daily.    CRESTOR 10 MG TABLET    TAKE 1 TABLET BY MOUTH DAILY   DOCUSATE SODIUM (COLACE) 100 MG CAPSULE    Take 100 mg by mouth daily.   DOXYCYCLINE (VIBRA-TABS) 100 MG TABLET    Take one tablet twice daily for urinary infection   FOLIC ACID (FOLVITE) 283 MCG  TABLET    Take 800 mcg by mouth daily.    FUROSEMIDE (LASIX) 20 MG TABLET    TAKE 1 TABLET BY MOUTH DAILY   GABAPENTIN (NEURONTIN) 100 MG CAPSULE    Take 100 mg by mouth 3 (three) times daily.   METOPROLOL (LOPRESSOR) 100 MG TABLET    Take 1 tablet (100 mg total) by mouth 2 (two) times daily.   MULTIPLE VITAMIN (MULTIVITAMIN WITH MINERALS) TABS TABLET    Take 1 tablet by mouth daily.   ONE TOUCH ULTRA TEST TEST STRIP       ONETOUCH DELICA LANCETS 15V MISC    by Does not apply route as directed. Use lancets to check blood sugars   PILOCARPINE (SALAGEN) 5 MG TABLET    Take 5 mg by mouth as needed.    PROBIOTIC PRODUCT (PROBIOTIC PO)    Take 1 tablet by mouth daily.   RASAGILINE (AZILECT) 1 MG TABS    Take 1 mg by mouth daily.   [place]    by Does not apply route.   ROTIGOTINE 8 MG/24HR PT24    Place 1 patch onto the skin daily.    SPIRONOLACTONE (ALDACTONE) 25 MG TABLET    Take 1 tablet (25 mg total) by mouth daily.   SYNTHROID 112 MCG TABLET    TAKE 1 TABLET BY MOUTH EVERY DAY  Modified Medications   No medications on file  Discontinued Medications   No medications on file    Physical Exam: Filed Vitals:   11/25/14 1136  BP: 140/72  Pulse: 79  Temp: 97.3 F (36.3 C)  TempSrc: Oral  Resp: 20  Height: 5\' 1"  (1.549 m)  Weight: 116 lb 6.4 oz (52.799 kg)  SpO2: 93%   Physical Exam  Constitutional: She appears well-developed and well-nourished. No distress.  HENT:  Head: Normocephalic and atraumatic.  Mouth/Throat: No oropharyngeal exudate.  Mouth is dry appearing; tongue has excoriation on right side tip  Eyes:  glasses  Cardiovascular: Normal rate, regular rhythm, normal heart sounds and intact distal pulses.   Pulmonary/Chest: Effort normal and breath sounds normal.  Musculoskeletal: Normal range of motion.  Uses rollator walker  Neurological: She is alert.  Skin: Skin is warm and dry.    Labs reviewed: Basic Metabolic Panel:  Recent Labs   12/26/13 0555 03/07/14 1155 04/08/14 1029 05/22/14 7616 10/08/14 0737  NA 139 139 137  --  142  K 4.4 4.5 5.3*  --  4.7  CL 103  --  100  --  100  CO2 26  --  24  --  23  GLUCOSE 97  --  86  --  92  BUN 32* 29* 23  --  34*  CREATININE 0.68 0.8 0.81  --  1.12*  CALCIUM 8.8  --  9.6  --  8.9  TSH  --   --  5.750* 4.080 5.220*   Liver Function Tests:  Recent Labs  12/21/13 2300 12/22/13 0239 12/25/13 0844 10/08/14 0832  AST 56* 58* 67* 44*  ALT 55* 57* 51* 42*  ALKPHOS 135* 136* 128* 127*  BILITOT 0.3 0.4 0.5 0.3  PROT 6.7 6.9 6.8 6.7  ALBUMIN 2.9* 3.0* 3.2*  --    No results for input(s): LIPASE, AMYLASE in the last 8760 hours. No results for input(s): AMMONIA in the last 8760 hours. CBC:  Recent Labs  12/21/13 1514  12/24/13 0309 12/26/13 0555 04/08/14 1029 10/08/14 0832  WBC 4.8  < > 5.0 5.4 7.0 6.2  NEUTROABS 2.6  --   --   --  4.4 3.6  HGB 11.1*  < > 10.6* 10.1* 11.3  --   HCT 33.9*  < > 32.4* 31.3* 36.7 35.9  MCV 95.5  < > 95.6 98.1 96  --   PLT 85*  < > 107* 126* 130*  --   < > = values in this interval not displayed. Lipid Panel:  Recent Labs  12/22/13 0239 04/08/14 1029 10/08/14 0832  CHOL 99 114 106  HDL 41 47 41  LDLCALC 31 48 49  TRIG 133 96 81  CHOLHDL 2.4 2.4 2.6   Lab Results  Component Value Date   HGBA1C 6.5* 10/08/2014    Assessment/Plan 1. Bleeding gums - will f/u labs due to thrombocytopenia, bleeding from mouth about 2x per mouth, on plavix - PT and PTT - CBC with Differential/Platelet  2. Thrombocytopenia - check labs: - PT and PTT - CBC with Differential/Platelet  3. Need for influenza vaccination - Flu Vaccine QUAD 36+ mos PF IM (Fluarix & Fluzone Quad PF) given   Labs/tests ordered:   Orders Placed This Encounter  Procedures  . Flu Vaccine QUAD 36+ mos PF IM (Fluarix & Fluzone Quad PF)  . PT and PTT  . CBC with Differential/Platelet    Next appt:  Keep as scheduled with fasting labs before  Koray Soter L.  Valaria Kohut, D.O. [facility] [address], [place] [address] Cell Phone (Mon-Fri 8am-5pm):  [phone] On Call:  [phone] & follow prompts after 5pm & weekends Office Phone:  [phone] Office Fax:  [phone]

## 2014-11-26 LAB — CBC WITH DIFFERENTIAL/PLATELET
Basophils Absolute: 0 10*3/uL (ref 0.0–0.2)
Basos: 0 %
EOS (ABSOLUTE): 0.1 10*3/uL (ref 0.0–0.4)
Eos: 2 %
Hematocrit: 33.3 % — ABNORMAL LOW (ref 34.0–46.6)
Hemoglobin: 10.6 g/dL — ABNORMAL LOW (ref 11.1–15.9)
Immature Grans (Abs): 0 10*3/uL (ref 0.0–0.1)
Immature Granulocytes: 0 %
Lymphocytes Absolute: 1.7 10*3/uL (ref 0.7–3.1)
Lymphs: 23 %
MCH: 29.8 pg (ref 26.6–33.0)
MCHC: 31.8 g/dL (ref 31.5–35.7)
MCV: 94 fL (ref 79–97)
Monocytes Absolute: 1 10*3/uL — ABNORMAL HIGH (ref 0.1–0.9)
Monocytes: 13 %
Neutrophils Absolute: 4.4 10*3/uL (ref 1.4–7.0)
Neutrophils: 62 %
Platelets: 133 10*3/uL — ABNORMAL LOW (ref 150–379)
RBC: 3.56 x10E6/uL — ABNORMAL LOW (ref 3.77–5.28)
RDW: 16.7 % — ABNORMAL HIGH (ref 12.3–15.4)
WBC: 7.2 10*3/uL (ref 3.4–10.8)

## 2014-11-26 LAB — PT AND PTT
INR: 1.1 (ref 0.8–1.2)
Prothrombin Time: 11.2 s (ref 9.1–12.0)
aPTT: 28 s (ref 24–33)

## 2014-11-27 ENCOUNTER — Other Ambulatory Visit: Payer: Self-pay

## 2014-11-27 DIAGNOSIS — D649 Anemia, unspecified: Secondary | ICD-10-CM

## 2014-11-28 ENCOUNTER — Other Ambulatory Visit: Payer: Medicare Other

## 2014-11-28 DIAGNOSIS — M19041 Primary osteoarthritis, right hand: Secondary | ICD-10-CM | POA: Diagnosis not present

## 2014-11-28 DIAGNOSIS — N183 Chronic kidney disease, stage 3 unspecified: Secondary | ICD-10-CM

## 2014-11-28 DIAGNOSIS — I7300 Raynaud's syndrome without gangrene: Secondary | ICD-10-CM | POA: Diagnosis not present

## 2014-11-28 DIAGNOSIS — D649 Anemia, unspecified: Secondary | ICD-10-CM

## 2014-11-28 DIAGNOSIS — M359 Systemic involvement of connective tissue, unspecified: Secondary | ICD-10-CM | POA: Diagnosis not present

## 2014-11-28 DIAGNOSIS — R5381 Other malaise: Secondary | ICD-10-CM | POA: Diagnosis not present

## 2014-11-29 LAB — BASIC METABOLIC PANEL
BUN/Creatinine Ratio: 36 — ABNORMAL HIGH (ref 11–26)
BUN: 37 mg/dL — ABNORMAL HIGH (ref 8–27)
CO2: 25 mmol/L (ref 18–29)
Calcium: 9.2 mg/dL (ref 8.7–10.3)
Chloride: 100 mmol/L (ref 97–108)
Creatinine, Ser: 1.02 mg/dL — ABNORMAL HIGH (ref 0.57–1.00)
GFR calc Af Amer: 58 mL/min/{1.73_m2} — ABNORMAL LOW (ref >59)
GFR calc non Af Amer: 50 mL/min/{1.73_m2} — ABNORMAL LOW (ref >59)
Glucose: 89 mg/dL (ref 65–99)
Potassium: 4.7 mmol/L (ref 3.5–5.2)
Sodium: 139 mmol/L (ref 134–144)

## 2014-11-30 LAB — FECAL OCCULT BLOOD, IMMUNOCHEMICAL: Fecal Occult Bld: POSITIVE — AB

## 2014-12-02 ENCOUNTER — Other Ambulatory Visit: Payer: Self-pay | Admitting: *Deleted

## 2014-12-02 DIAGNOSIS — M8580 Other specified disorders of bone density and structure, unspecified site: Secondary | ICD-10-CM

## 2014-12-03 ENCOUNTER — Ambulatory Visit: Payer: Medicare Other | Admitting: Physical Therapy

## 2014-12-03 ENCOUNTER — Ambulatory Visit: Payer: Medicare Other | Admitting: Occupational Therapy

## 2014-12-03 ENCOUNTER — Ambulatory Visit: Payer: Medicare Other | Attending: Physician Assistant

## 2014-12-03 DIAGNOSIS — R258 Other abnormal involuntary movements: Secondary | ICD-10-CM | POA: Insufficient documentation

## 2014-12-03 DIAGNOSIS — R471 Dysarthria and anarthria: Secondary | ICD-10-CM | POA: Insufficient documentation

## 2014-12-03 NOTE — Therapy (Signed)
[place] [address] [place], [place], [address] Phone: [phone]   Fax:  [phone]  Patient Details  Name: Victoria Lindsey MRN: [id number] Date of Birth: 1925-07-26 Referring Provider:  [name], DO  Encounter Date: 12/03/2014  Speech Therapy Parkinson's Disease Screen    Decibel Level today averaged 68dB  (WNL=70-72 dB) with sound level meter 30cm away from pt's mouth Pt's conversational volume is outside of normal range. Pt's volume in simple conversation ranged from 62-70dB  Pt has not experienced difficulty in swallowing warranting objective evaluation  Pt might benefit from a speech-language eval for dysarthria, but stated today she does not desire therapy. "I think I'm doing just fine," and husbands voiced his agreement.  If pt should desire therapy in the future please send referral via EPIC. Thank you.    [facility] , [place], [place]   12/03/2014, 1:27 PM  [place] [address] [place], [place], [address] Phone: [phone]   Fax:  [phone]

## 2014-12-03 NOTE — Therapy (Signed)
[place] [address] [place], [place], [address] Phone: [phone]   Fax:  [phone]  Patient Details  Name: Victoria Lindsey MRN: [id number] Date of Birth: [birth year] Referring Provider:  [name], DO  Encounter Date: 12/03/2014   Occupational Therapy Parkinson's Disease Screen  Physical Performance Test item #4 (donning/doffing jacket):  41.03sec  9-hole peg test:    RUE  30.0sec        LUE  28.93sec  Change in ability to perform ADLs/IADLs:  No per pt/husband  Other Comments:  Mild incr time for PPT#4 as compared to therapy d/c; however, pt/husband report that they feel that pt is doing well with simple IADLs and BADLs and do not have occupational therapy concerns at this time.  Pt does not require occupation therapy services at this time.  Recommended occupational therapy screen in   Approx. 6-49months   [facility] 12/03/2014, 1:39 PM  [place] [address] [place], [place], [address] Phone: [phone]   Fax:  [place], OTR/L 12/03/2014 2:19 PM

## 2014-12-04 ENCOUNTER — Encounter: Payer: Self-pay | Admitting: Physical Therapy

## 2014-12-04 ENCOUNTER — Other Ambulatory Visit: Payer: Self-pay | Admitting: Cardiology

## 2014-12-04 NOTE — Therapy (Signed)
[place] [address] [place], [place], [address] Phone: [phone]   Fax:  [phone]  Patient Details  Name: Victoria Lindsey MRN: [id number] Date of Birth: [birth year] Referring Provider:  No ref. provider found  Encounter Date: 12/04/2014  PHYSICAL THERAPY DISCHARGE SUMMARY  Visits from Start of Care: 24  Current functional level related to goals / functional outcomes:     PT Long Term Goals - 07/01/14 1426    PT LONG TERM GOAL #1   Title Pt will verbalize plans for continued community fitness upon D/C from PT   Status Achieved   PT LONG TERM GOAL #2   Title Pt will improve Berg Balance score to at least 37/56 for decreased fall risk.   Status Not Met  36/57   PT LONG TERM GOAL #3   Title Pt will improve TUG score to less than or equal to 25 seconds for decreased fall risk.   Baseline 06/25/14-24.84 sec   Status Achieved   PT LONG TERM GOAL #4   Title Pt will improve gait velocity to at least 2 ft/sec for improved gait efficiency and safety.   Baseline 2.05 ft/sec 06/25/14   Status Achieved   PT LONG TERM GOAL #5   Title Pt will verbalize continued community fitness plans upon D/C from PT.   Status Achieved    Pt has demonstrated improved overall functional mobility during course of PT.   Remaining deficits: Bradykinesia, decreased balance, gait instability   Education / Equipment: Pt/family educated in community fitness, fall prevention, HEP.  Plan: Patient agrees to discharge.  Patient goals were partially met. Patient is being discharged due to meeting the stated rehab goals.  ?????      MARRIOTT,AMY W. 12/04/2014, 8:33 AM [facility] [address] [place], [place], [address] Phone: [phone]   Fax:  [phone]

## 2014-12-16 ENCOUNTER — Telehealth: Payer: Self-pay | Admitting: Physical Therapy

## 2014-12-16 NOTE — Telephone Encounter (Signed)
Spoke with Mrs. Windhorst regarding follow up to PT screen on 12/03/14.  At screen, PT/patient discussed getting an order from physician to address continued balance concerns.  PT phoned patient today to check with doctor to request referral from.  Patient states today that it would be a lot of trouble to come out to physical therapy and she does not feel that she needs physical therapy right now.  PT did recommend follow-up with PT, OT, speech therapy screens in 6 months.  Will request our front office staff to contact patient to schedule those screens.  Mady Haagensen, PT

## 2014-12-16 NOTE — Therapy (Signed)
[facility] [address] [place], [place], [address] Phone: [phone]   Fax:  [phone]  Patient Details  Name: Victoria Lindsey MRN: [id number] Date of Birth: 06-25-1925 Referring Provider:  [name], DO  Encounter Date: 12/03/2014  Physical Therapy Parkinson's Disease Screen   Pt has had no falls since her PT discharge.  She still feels that balance is a problem for her. Patient would benefit from Physical Therapy evaluation due to continued balance issues, slowed mobility and gait.      [name] W. 12/16/2014, 9:37 AM  Frazier Butt., PT  [place] [address] [place], [place], [address] Phone: [phone]   Fax:  [phone]

## 2014-12-30 ENCOUNTER — Other Ambulatory Visit: Payer: Self-pay | Admitting: Cardiology

## 2014-12-30 ENCOUNTER — Other Ambulatory Visit: Payer: Self-pay | Admitting: Internal Medicine

## 2015-01-02 NOTE — Telephone Encounter (Signed)
Error

## 2015-01-08 ENCOUNTER — Other Ambulatory Visit: Payer: Medicare Other

## 2015-01-08 DIAGNOSIS — N183 Chronic kidney disease, stage 3 unspecified: Secondary | ICD-10-CM

## 2015-01-08 DIAGNOSIS — E039 Hypothyroidism, unspecified: Secondary | ICD-10-CM | POA: Diagnosis not present

## 2015-01-08 DIAGNOSIS — E119 Type 2 diabetes mellitus without complications: Secondary | ICD-10-CM | POA: Diagnosis not present

## 2015-01-08 DIAGNOSIS — E559 Vitamin D deficiency, unspecified: Secondary | ICD-10-CM | POA: Diagnosis not present

## 2015-01-09 LAB — COMPREHENSIVE METABOLIC PANEL
ALT: 46 IU/L — ABNORMAL HIGH (ref 0–32)
AST: 46 IU/L — ABNORMAL HIGH (ref 0–40)
Albumin/Globulin Ratio: 1.3 (ref 1.1–2.5)
Albumin: 3.8 g/dL (ref 3.5–4.7)
Alkaline Phosphatase: 154 IU/L — ABNORMAL HIGH (ref 39–117)
BUN/Creatinine Ratio: 32 — ABNORMAL HIGH (ref 11–26)
BUN: 29 mg/dL — ABNORMAL HIGH (ref 8–27)
Bilirubin Total: 0.3 mg/dL (ref 0.0–1.2)
CO2: 25 mmol/L (ref 18–29)
Calcium: 9.1 mg/dL (ref 8.7–10.3)
Chloride: 99 mmol/L (ref 97–106)
Creatinine, Ser: 0.92 mg/dL (ref 0.57–1.00)
GFR calc Af Amer: 65 mL/min/{1.73_m2} (ref >59)
GFR calc non Af Amer: 57 mL/min/{1.73_m2} — ABNORMAL LOW (ref >59)
Globulin, Total: 3 g/dL (ref 1.5–4.5)
Glucose: 102 mg/dL — ABNORMAL HIGH (ref 65–99)
Potassium: 4.7 mmol/L (ref 3.5–5.2)
Sodium: 139 mmol/L (ref 136–144)
Total Protein: 6.8 g/dL (ref 6.0–8.5)

## 2015-01-09 LAB — CBC WITH DIFFERENTIAL/PLATELET
Basophils Absolute: 0 10*3/uL (ref 0.0–0.2)
Basos: 0 %
EOS (ABSOLUTE): 0.2 10*3/uL (ref 0.0–0.4)
Eos: 3 %
Hematocrit: 32.8 % — ABNORMAL LOW (ref 34.0–46.6)
Hemoglobin: 10.3 g/dL — ABNORMAL LOW (ref 11.1–15.9)
Immature Grans (Abs): 0 10*3/uL (ref 0.0–0.1)
Immature Granulocytes: 0 %
Lymphocytes Absolute: 1.6 10*3/uL (ref 0.7–3.1)
Lymphs: 26 %
MCH: 29.3 pg (ref 26.6–33.0)
MCHC: 31.4 g/dL — ABNORMAL LOW (ref 31.5–35.7)
MCV: 93 fL (ref 79–97)
Monocytes Absolute: 0.8 10*3/uL (ref 0.1–0.9)
Monocytes: 13 %
Neutrophils Absolute: 3.7 10*3/uL (ref 1.4–7.0)
Neutrophils: 58 %
Platelets: 123 10*3/uL — ABNORMAL LOW (ref 150–379)
RBC: 3.51 x10E6/uL — ABNORMAL LOW (ref 3.77–5.28)
RDW: 16.9 % — ABNORMAL HIGH (ref 12.3–15.4)
WBC: 6.3 10*3/uL (ref 3.4–10.8)

## 2015-01-09 LAB — LIPID PANEL
Chol/HDL Ratio: 2.9 ratio units (ref 0.0–4.4)
Cholesterol, Total: 112 mg/dL (ref 100–199)
HDL: 39 mg/dL — ABNORMAL LOW (ref >39)
LDL Calculated: 55 mg/dL (ref 0–99)
Triglycerides: 89 mg/dL (ref 0–149)
VLDL Cholesterol Cal: 18 mg/dL (ref 5–40)

## 2015-01-09 LAB — HEMOGLOBIN A1C
Est. average glucose Bld gHb Est-mCnc: 154 mg/dL
Hgb A1c MFr Bld: 7 % — ABNORMAL HIGH (ref 4.8–5.6)

## 2015-01-09 LAB — TSH: TSH: 9.96 u[IU]/mL — ABNORMAL HIGH (ref 0.450–4.500)

## 2015-01-10 ENCOUNTER — Encounter: Payer: Self-pay | Admitting: Internal Medicine

## 2015-01-10 ENCOUNTER — Ambulatory Visit (INDEPENDENT_AMBULATORY_CARE_PROVIDER_SITE_OTHER): Payer: Medicare Other | Admitting: Internal Medicine

## 2015-01-10 VITALS — BP 140/72 | HR 77 | Temp 97.3°F | Resp 20 | Ht 61.0 in | Wt 118.8 lb

## 2015-01-10 DIAGNOSIS — R1030 Lower abdominal pain, unspecified: Secondary | ICD-10-CM | POA: Diagnosis not present

## 2015-01-10 DIAGNOSIS — E559 Vitamin D deficiency, unspecified: Secondary | ICD-10-CM | POA: Diagnosis not present

## 2015-01-10 DIAGNOSIS — E119 Type 2 diabetes mellitus without complications: Secondary | ICD-10-CM | POA: Diagnosis not present

## 2015-01-10 DIAGNOSIS — G20 Parkinson's disease: Secondary | ICD-10-CM

## 2015-01-10 DIAGNOSIS — E038 Other specified hypothyroidism: Secondary | ICD-10-CM

## 2015-01-10 DIAGNOSIS — R740 Nonspecific elevation of levels of transaminase and lactic acid dehydrogenase [LDH]: Secondary | ICD-10-CM

## 2015-01-10 DIAGNOSIS — D62 Acute posthemorrhagic anemia: Secondary | ICD-10-CM

## 2015-01-10 DIAGNOSIS — G20A1 Parkinson's disease without dyskinesia, without mention of fluctuations: Secondary | ICD-10-CM

## 2015-01-10 DIAGNOSIS — R7401 Elevation of levels of liver transaminase levels: Secondary | ICD-10-CM

## 2015-01-10 MED ORDER — POLYSACCHARIDE IRON COMPLEX 150 MG PO CAPS: 150.0000 mg | ORAL_CAPSULE | Freq: Every day | ORAL | Status: DC

## 2015-01-10 MED ORDER — LEVOTHYROXINE SODIUM 125 MCG PO TABS: 125.0000 ug | ORAL_TABLET | Freq: Every day | ORAL | Status: DC

## 2015-01-10 NOTE — Progress Notes (Signed)
Patient ID: Victoria Lindsey, female   DOB: [birth year], 81 y.o.   MRN: [id number]   Location: [facility] Provider: Rexene Edison. Mariea Clonts, D.O., C.M.D.  Code Status: DNR Goals of Care: Advanced Directive information Does patient have an advance directive?: Yes  Chief Complaint  Patient presents with  . Medical Management of Chronic Issues    3 month follow-up for DM, Hypertension, Hypothyroidism    HPI: Patient is a 81 y.o. female seen in the office today for med mgt of chronic diseases.  Had one more bleeding episode 10/1 and 10/10 from her nose/mouth.  Not nearly as bad.  Has not seen blood in the stool.  Remains on plavix.  Had hemoccult positive 9/22.  Admits to straining and hemorrhoids.  Weight up 2 lbs with leather jacket on.  Her husband is encouraging her to eat.  As we discussed this further, we opted to hold off on GI referral given her advanced age and high risk with cscope.  Also says last cscope was painful.  She had one polyp in the past.  Denies upper abdominal pain or indigestion.    Diabetes:  Eats sausage biscuit or eggs or belgian waffles or pancakes for breakfast and a lot of dessert.    Her daughter has concerns that she gets stomach irritation from new medications.  Iron type that will cause less stomach discomfort.    When she has to go pee, she has a sensation of pain.  She says it's never been figured out.  ?interstitial cystitis.  Hasn't seen urology in a while.  Discussed [facility].    For Parkinson's disease, she opted not to return for neurorehab. Tries to exercise at home.    Need to adjust thyroid medication based on labs.  Review of Systems:  Review of Systems  Constitutional: Positive for malaise/fatigue. Negative for fever.  HENT: Negative for congestion.        Bleeding in mouth 2x in october  Respiratory: Negative for shortness of breath.   Cardiovascular: Negative for chest pain and palpitations.  Gastrointestinal: Positive for heartburn  and abdominal pain. Negative for blood in stool and melena.  Genitourinary: Positive for dysuria, urgency and frequency.  Musculoskeletal: Negative for falls.  Neurological: Positive for tremors. Negative for dizziness and loss of consciousness.  Endo/Heme/Allergies:       Eats a lot of sweets  Psychiatric/Behavioral: Positive for memory loss. Negative for depression.    Past Medical History  Diagnosis Date  . Hypertension   . Parkinson's disease   . Hyperlipidemia   . Dizziness   . Orthostatic hypotension   . Anemia   . Tremor   . Breast cancer ([place])     LEFT BREAST  . Kidney failure     stage 3  . Hypothyroidism   . Prediabetes   . TIA (transient ischemic attack)   . Hyperglycemia   . Left ventricular diastolic dysfunction   . Hypophonia     Past Surgical History  Procedure Laterality Date  . Cardiovascular stress test  06/2007    NORMAL  . Breast lumpectomy  2010  . Cholecystectomy  1994  . Tonsillectomy    . Nasal sinus surgery      submucous resection late 1950s  . Cataract extraction  2005,2006    Dr.Devonzo    Allergies  Allergen Reactions  . Aciphex [Rabeprazole Sodium]   . Amantadines   . Avapro [Irbesartan]   . Carbidopa-Levodopa   . Cardura [Doxazosin Mesylate]   .  Ciprofloxacin   . Clonidine Derivatives   . Cozaar     nightmares  . Diltiazem   . Famotidine   . Hctz [Hydrochlorothiazide]   . Indapamide   . Lansoprazole   . Lisinopril     Mouth problems  . Loratadine   . Maxidex [Dexamethasone]   . Mirapex [Pramipexole Dihydrochloride]   . Norvasc [Amlodipine Besylate]   . Pantoprazole Sodium   . Penicillins   . Prilosec [Omeprazole]   . Procardia [Nifedipine]   . Proton Pump Inhibitors   . Sulfa Drugs Cross Reactors   . Trimethoprim   . Zyrtec [Cetirizine Hcl]       Medication List       This list is accurate as of: 01/10/15 11:14 AM.  Always use your most recent med list.               AMBULATORY NON FORMULARY MEDICATION    Outpatient Speech Therapy, evaluate and treat.  DX: G20, R49.8     aspirin 81 MG tablet  Take 81 mg by mouth every evening.     AZILECT 1 MG Tabs tablet  Generic drug:  rasagiline  Take 1 mg by mouth daily.     BIOTIN PO  Take 1,000 mcg by mouth daily.     cephALEXin 250 MG capsule  Commonly known as:  KEFLEX  TAKE ONE CAPSULE BY MOUTH EVERY NIGHT AT BEDTIME TO PREVENT UTI     clopidogrel 75 MG tablet  Commonly known as:  PLAVIX  TAKE ONE TABLET BY MOUTH EVERY DAY     Cranberry 500 MG Caps  Take 500 mg by mouth daily.     docusate sodium 100 MG capsule  Commonly known as:  COLACE  Take 100 mg by mouth daily.     doxycycline 100 MG tablet  Commonly known as:  VIBRA-TABS  Take one tablet twice daily for urinary infection     folic acid 342 MCG tablet  Commonly known as:  FOLVITE  Take 800 mcg by mouth daily.     furosemide 20 MG tablet  Commonly known as:  LASIX  TAKE 1 TABLET BY MOUTH DAILY     gabapentin 100 MG capsule  Commonly known as:  NEURONTIN  Take 100 mg by mouth 3 (three) times daily.     metoprolol 100 MG tablet  Commonly known as:  LOPRESSOR  Take 1 tablet (100 mg total) by mouth 2 (two) times daily.     multivitamin with minerals Tabs tablet  Take 1 tablet by mouth daily.     ONE TOUCH ULTRA TEST test strip  Generic drug:  glucose blood     pilocarpine 5 MG tablet  Commonly known as:  SALAGEN  Take 5 mg by mouth as needed.     PROBIOTIC PO  Take 1 tablet by mouth daily.     ReliOn Ultra Thin Lancets Misc  by Does not apply route.     ONETOUCH DELICA LANCETS 87G Misc  by Does not apply route as directed. Use lancets to check blood sugars     rosuvastatin 10 MG tablet  Commonly known as:  CRESTOR  TAKE 1 TABLET BY MOUTH DAILY     Rotigotine 8 MG/24HR Pt24  Place 1 patch onto the skin daily.     spironolactone 25 MG tablet  Commonly known as:  ALDACTONE  Take 1 tablet (25 mg total) by mouth daily.     SYNTHROID 112 MCG tablet   Generic drug:  levothyroxine  TAKE 1 TABLET BY MOUTH EVERY DAY     Vitamin D3 2000 UNITS Tabs  Take 1 tablet by mouth daily.        Health Maintenance  Topic Date Due  . OPHTHALMOLOGY EXAM  02/04/1936  . PNA vac Low Risk Adult (2 of 2 - PCV13) 05/24/2014  . MAMMOGRAM  03/08/2048 (Originally 03/08/2012)  . HEMOGLOBIN A1C  07/08/2015  . INFLUENZA VACCINE  10/07/2015  . FOOT EXAM  10/08/2015  . URINE MICROALBUMIN  10/11/2015  . TETANUS/TDAP  02/08/2022  . DEXA SCAN  Completed  . ZOSTAVAX  Completed    Physical Exam: Filed Vitals:   01/10/15 1056  BP: 140/72  Pulse: 77  Temp: 97.3 F (36.3 C)  TempSrc: Oral  Resp: 20  Height: 5\' 1"  (1.549 m)  Weight: 118 lb 12.8 oz (53.887 kg)  SpO2: 91%   Body mass index is 22.46 kg/(m^2). Physical Exam  Constitutional: No distress.  Cardiovascular: Normal rate, regular rhythm, normal heart sounds and intact distal pulses.   Pulmonary/Chest: Effort normal and breath sounds normal.  Abdominal: Soft. Bowel sounds are normal. She exhibits no distension and no mass. There is no tenderness.  Genitourinary: Guaiac negative stool.  Hemorrhoids present--no significant stool in rectal vault for good sample  Musculoskeletal:  Stooped posture, flat affect, walks with rollator walker  Neurological: She is alert.  Skin: Skin is warm and dry. There is pallor.  Psychiatric: She has a normal mood and affect.    Labs reviewed: Basic Metabolic Panel:  Recent Labs  05/22/14 0942 10/08/14 0832 11/28/14 1037 01/08/15 0806  NA  --  142 139 139  K  --  4.7 4.7 4.7  CL  --  100 100 99  CO2  --  23 25 25   GLUCOSE  --  92 89 102*  BUN  --  34* 37* 29*  CREATININE  --  1.12* 1.02* 0.92  CALCIUM  --  8.9 9.2 9.1  TSH 4.080 5.220*  --  9.960*   Liver Function Tests:  Recent Labs  10/08/14 0832 01/08/15 0806  AST 44* 46*  ALT 42* 46*  ALKPHOS 127* 154*  BILITOT 0.3 0.3  PROT 6.7 6.8  ALBUMIN 3.9 3.8   No results for input(s): LIPASE,  AMYLASE in the last 8760 hours. No results for input(s): AMMONIA in the last 8760 hours. CBC:  Recent Labs  04/08/14 1029 10/08/14 0832 11/25/14 1218 01/08/15 0806  WBC 7.0 6.2 7.2 6.3  NEUTROABS 4.4 3.6 4.4 3.7  HGB 11.3  --   --   --   HCT 36.7 35.9 33.3* 32.8*  MCV 96  --   --   --   PLT 130*  --   --   --    Lipid Panel:  Recent Labs  04/08/14 1029 10/08/14 0832 01/08/15 0806  CHOL 114 106 112  HDL 47 41 39*  LDLCALC 48 49 55  TRIG 96 81 89  CHOLHDL 2.4 2.6 2.9   Lab Results  Component Value Date   HGBA1C 7.0* 01/08/2015    Procedures since last visit:  Hemoccult positive  Assessment/Plan 1. Vitamin D deficiency - cont vitamin D 2000units daily and needs recheck of bone density - Vitamin D, 25-hydroxy - DG Bone Density; Future  2. Acute blood loss anemia -begin niferex iron supplement -discussed risk of constipation and gi upset so we chose this variety in hopes it will not be so upsetting to her system -monitor cbc and  for blood in stools -they do not want to stop asa or plavix at present  3. Type 2 diabetes mellitus without complication, without long-term current use of insulin (HCC) -cont to work on cutting back some on sweets b/c she really does eat a lot of them based on review of intake today and hba1c is trending up  4. Idiopathic Parkinson's disease ([place]) -cont using rollator at all times, azilect, rotigotine patch  5. Suprapubic abdominal pain, unspecified laterality -has before urinating so suspect this is bladder spasm -says it's not bothersome enough to take anything for it.  6. Transaminitis -etiology unclear -reassess with full liver panel - Hepatic Function Panel; Future  7. Other specified hypothyroidism - TSH; Future - change dose to levothyroxine (SYNTHROID, LEVOTHROID) 125 MCG tablet; Take 1 tablet (125 mcg total) by mouth daily before breakfast.  Dispense: 30 tablet; Refill: 3    Labs/tests ordered:   Orders Placed This  Encounter  Procedures  . DG Bone Density    Standing Status: Future     Number of Occurrences:      Standing Expiration Date: 03/11/2016    Order Specific Question:  Reason for Exam (SYMPTOM  OR DIAGNOSIS REQUIRED)    Answer:  senile osteoporosis, vitamin D deficiency    Order Specific Question:  Preferred imaging location?    Answer:  [facility]  . Vitamin D, 25-hydroxy  . TSH    Standing Status: Future     Number of Occurrences:      Standing Expiration Date: 04/04/2015  . Hepatic Function Panel    Standing Status: Future     Number of Occurrences:      Standing Expiration Date: 04/04/2015   Next appt:  6 wks labs, 3 mos visit   Miel Wisener L. Adric Wrede, D.O. [facility] [address],  [address] Cell Phone (Mon-Fri 8am-5pm):  [phone] On Call:  [phone] & follow prompts after 5pm & weekends Office Phone:  [phone] Office Fax:  [phone]

## 2015-01-16 ENCOUNTER — Ambulatory Visit (INDEPENDENT_AMBULATORY_CARE_PROVIDER_SITE_OTHER): Payer: Medicare Other | Admitting: Podiatry

## 2015-01-16 VITALS — BP 170/64 | HR 84

## 2015-01-16 DIAGNOSIS — B351 Tinea unguium: Secondary | ICD-10-CM | POA: Diagnosis not present

## 2015-01-16 DIAGNOSIS — G20 Parkinson's disease: Secondary | ICD-10-CM

## 2015-01-16 NOTE — Patient Instructions (Signed)
Seen for hypertrophic nails. All nails debrided. Return in 3 months or as needed.  

## 2015-01-16 NOTE — Progress Notes (Signed)
SUBJECTIVE: 81 y.o. year old female presents accompanied by her husband requesting toe nails trimmed.  She has Parkinson's and  having problem getting her nails trimmed.   REVIEW OF SYSTEMS: Positive of Parkinson's disease, COPD.   OBJECTIVE: DERMATOLOGIC EXAMINATION: Hypertrophic nails x 10. Thin shiny skin both feet. VASCULAR EXAMINATION OF LOWER LIMBS: Pedal pulses: Left foot pedal pulses are palpable with normal pulsation. Right foot is not palpable.  NEUROLOGIC EXAMINATION OF THE LOWER LIMBS: All epicritic and tactile sensations grossly intact.  MUSCULOSKELETAL EXAMINATION: No gross deformities noted.  ASSESSMENT: Onychomycosis x 10. Deformed right great toe nail.  PLAN: All nails debrided. Return in 3 months or as needed.

## 2015-01-17 ENCOUNTER — Ambulatory Visit: Payer: Medicare Other | Admitting: Podiatry

## 2015-01-17 ENCOUNTER — Encounter: Payer: Self-pay | Admitting: Podiatry

## 2015-01-17 DIAGNOSIS — B351 Tinea unguium: Secondary | ICD-10-CM | POA: Insufficient documentation

## 2015-01-17 LAB — SPECIMEN STATUS REPORT

## 2015-01-17 LAB — VITAMIN D 25 HYDROXY (VIT D DEFICIENCY, FRACTURES): Vit D, 25-Hydroxy: 52.8 ng/mL (ref 30.0–100.0)

## 2015-01-20 DIAGNOSIS — G20 Parkinson's disease: Secondary | ICD-10-CM | POA: Diagnosis not present

## 2015-01-20 DIAGNOSIS — G2581 Restless legs syndrome: Secondary | ICD-10-CM | POA: Diagnosis not present

## 2015-01-25 ENCOUNTER — Emergency Department (HOSPITAL_COMMUNITY): Payer: Medicare Other

## 2015-01-25 ENCOUNTER — Emergency Department (HOSPITAL_COMMUNITY)
Admission: EM | Admit: 2015-01-25 | Discharge: 2015-01-25 | Disposition: A | Discharge status: Home / Self Care | Payer: Medicare Other | Attending: Emergency Medicine | Admitting: Emergency Medicine

## 2015-01-25 ENCOUNTER — Encounter (HOSPITAL_COMMUNITY): Payer: Self-pay

## 2015-01-25 DIAGNOSIS — E039 Hypothyroidism, unspecified: Secondary | ICD-10-CM | POA: Diagnosis not present

## 2015-01-25 DIAGNOSIS — Z7982 Long term (current) use of aspirin: Secondary | ICD-10-CM | POA: Diagnosis not present

## 2015-01-25 DIAGNOSIS — Z9889 Other specified postprocedural states: Secondary | ICD-10-CM | POA: Insufficient documentation

## 2015-01-25 DIAGNOSIS — K529 Noninfective gastroenteritis and colitis, unspecified: Secondary | ICD-10-CM | POA: Diagnosis not present

## 2015-01-25 DIAGNOSIS — I10 Essential (primary) hypertension: Secondary | ICD-10-CM | POA: Insufficient documentation

## 2015-01-25 DIAGNOSIS — Z792 Long term (current) use of antibiotics: Secondary | ICD-10-CM | POA: Insufficient documentation

## 2015-01-25 DIAGNOSIS — Z853 Personal history of malignant neoplasm of breast: Secondary | ICD-10-CM | POA: Insufficient documentation

## 2015-01-25 DIAGNOSIS — Z880 Allergy status to penicillin: Secondary | ICD-10-CM | POA: Diagnosis not present

## 2015-01-25 DIAGNOSIS — Z87448 Personal history of other diseases of urinary system: Secondary | ICD-10-CM | POA: Insufficient documentation

## 2015-01-25 DIAGNOSIS — Z8673 Personal history of transient ischemic attack (TIA), and cerebral infarction without residual deficits: Secondary | ICD-10-CM | POA: Insufficient documentation

## 2015-01-25 DIAGNOSIS — D649 Anemia, unspecified: Secondary | ICD-10-CM | POA: Diagnosis not present

## 2015-01-25 DIAGNOSIS — Z87891 Personal history of nicotine dependence: Secondary | ICD-10-CM | POA: Diagnosis not present

## 2015-01-25 DIAGNOSIS — G20 Parkinson's disease: Secondary | ICD-10-CM | POA: Insufficient documentation

## 2015-01-25 DIAGNOSIS — R109 Unspecified abdominal pain: Secondary | ICD-10-CM | POA: Diagnosis not present

## 2015-01-25 DIAGNOSIS — Z79899 Other long term (current) drug therapy: Secondary | ICD-10-CM | POA: Diagnosis not present

## 2015-01-25 LAB — CBC
HEMATOCRIT: 39.8 % (ref 36.0–46.0)
Hemoglobin: 12.4 g/dL (ref 12.0–15.0)
MCH: 30.1 pg (ref 26.0–34.0)
MCHC: 31.2 g/dL (ref 30.0–36.0)
MCV: 96.6 fL (ref 78.0–100.0)
PLATELETS: 99 10*3/uL — AB (ref 150–400)
RBC: 4.12 MIL/uL (ref 3.87–5.11)
RDW: 18.5 % — AB (ref 11.5–15.5)
WBC: 12.1 10*3/uL — ABNORMAL HIGH (ref 4.0–10.5)

## 2015-01-25 LAB — C DIFFICILE QUICK SCREEN W PCR REFLEX
C DIFFICILE (CDIFF) TOXIN: NEGATIVE
C Diff antigen: POSITIVE — AB

## 2015-01-25 LAB — COMPREHENSIVE METABOLIC PANEL
ALBUMIN: 4.2 g/dL (ref 3.5–5.0)
ALK PHOS: 176 U/L — AB (ref 38–126)
ALT: 78 U/L — AB (ref 14–54)
AST: 75 U/L — AB (ref 15–41)
Anion gap: 11 (ref 5–15)
BILIRUBIN TOTAL: 0.4 mg/dL (ref 0.3–1.2)
BUN: 39 mg/dL — ABNORMAL HIGH (ref 6–20)
CALCIUM: 9.6 mg/dL (ref 8.9–10.3)
CO2: 21 mmol/L — ABNORMAL LOW (ref 22–32)
CREATININE: 1.05 mg/dL — AB (ref 0.44–1.00)
Chloride: 104 mmol/L (ref 101–111)
GFR calc Af Amer: 54 mL/min — ABNORMAL LOW (ref >60)
GFR calc non Af Amer: 47 mL/min — ABNORMAL LOW (ref >60)
GLUCOSE: 132 mg/dL — AB (ref 65–99)
POTASSIUM: 5 mmol/L (ref 3.5–5.1)
Sodium: 136 mmol/L (ref 135–145)
TOTAL PROTEIN: 8.4 g/dL — AB (ref 6.5–8.1)

## 2015-01-25 LAB — LIPASE, BLOOD: Lipase: 33 U/L (ref 11–51)

## 2015-01-25 MED ORDER — SODIUM CHLORIDE 0.9 % IV BOLUS (SEPSIS)
500.0000 mL | Freq: Once | INTRAVENOUS | Status: AC
  Administered 2015-01-25: 500 mL via INTRAVENOUS

## 2015-01-25 MED ORDER — ONDANSETRON HCL 4 MG/2ML IJ SOLN
4.0000 mg | Freq: Once | INTRAMUSCULAR | Status: AC
  Administered 2015-01-25: 4 mg via INTRAVENOUS
  Filled 2015-01-25: qty 2

## 2015-01-25 MED ORDER — ONDANSETRON HCL 4 MG PO TABS: 4.0000 mg | ORAL_TABLET | Freq: Four times a day (QID) | ORAL | Status: DC

## 2015-01-25 MED ORDER — IOHEXOL 300 MG/ML  SOLN
100.0000 mL | Freq: Once | INTRAMUSCULAR | Status: AC | PRN
  Administered 2015-01-25: 80 mL via INTRAVENOUS

## 2015-01-25 MED ORDER — LOPERAMIDE HCL 2 MG PO CAPS
2.0000 mg | ORAL_CAPSULE | Freq: Four times a day (QID) | ORAL | Status: DC | PRN
Start: 2015-01-25 — End: 2015-02-06

## 2015-01-25 NOTE — ED Notes (Signed)
Patient c/o mid abdominal pain, N/V/D since 0800 today.

## 2015-01-25 NOTE — ED Notes (Signed)
Awake. Verbally responsive. A/O x4. Resp even and unlabored. No audible adventitious breath sounds noted. ABC's intact.  

## 2015-01-25 NOTE — ED Provider Notes (Signed)
CSN: [id number]     Arrival date & time 01/25/15  1118 History   First MD Initiated Contact with Patient 01/25/15 1151     Chief Complaint  Patient presents with  . Abdominal Pain  . Emesis  . Diarrhea     (Consider location/radiation/quality/duration/timing/severity/associated sxs/prior Treatment) HPI...Marland KitchenMarland KitchenMarland Kitchen multiple episodes of nausea vomiting and diarrhea since early this morning. Patient feels weak and dehydrated. No chest pain or dyspnea. No blood or mucus and diarrhea. Severity of symptoms is moderate.  Past Medical History  Diagnosis Date  . Hypertension   . Parkinson's disease   . Hyperlipidemia   . Dizziness   . Orthostatic hypotension   . Anemia   . Tremor   . Breast cancer ([place])     LEFT BREAST  . Kidney failure     stage 3  . Hypothyroidism   . Prediabetes   . TIA (transient ischemic attack)   . Hyperglycemia   . Left ventricular diastolic dysfunction   . Hypophonia    Past Surgical History  Procedure Laterality Date  . Cardiovascular stress test  06/2007    NORMAL  . Breast lumpectomy  2010  . Cholecystectomy  1994  . Tonsillectomy    . Nasal sinus surgery      submucous resection late 1950s  . Cataract extraction  2005,2006    Dr.Devonzo   Family History  Problem Relation Age of Onset  . Heart disease Mother   . Heart failure Father   . Parkinsonism Father   . COPD Sister   . Cancer Brother   . Osteoporosis Daughter   . Diabetes Father   . Stroke Sister    Social History  Substance Use Topics  . Smoking status: Former Smoker -- 0.50 packs/day for 12 years    Types: Cigarettes    Quit date: 03/08/1964  . Smokeless tobacco: Never Used  . Alcohol Use: No   OB History    No data available     Review of Systems  All other systems reviewed and are negative.     Allergies  Aciphex; Amantadines; Avapro; Carbidopa-levodopa; Cardura; Ciprofloxacin; Clonidine derivatives; Cozaar; Diltiazem; Famotidine; Hctz; Indapamide; Lansoprazole;  Lisinopril; Loratadine; Maxidex; Mirapex; Norvasc; Pantoprazole sodium; Penicillins; Prilosec; Procardia; Proton pump inhibitors; Sulfa drugs cross reactors; Trimethoprim; and Zyrtec  Home Medications   Prior to Admission medications   Medication Sig Start Date End Date Taking? Authorizing Provider  AMBULATORY NON FORMULARY MEDICATION Outpatient Speech Therapy, evaluate and treat.  DX: G20, R49.8 04/25/14  Yes Tiffany L Reed, DO  ONE TOUCH ULTRA TEST test strip  02/04/14  Yes Historical Provider, MD  [facility] DELICA LANCETS [id number] MISC by Does not apply route as directed. Use lancets to check blood sugars   Yes Historical Provider, MD  ReliOn Ultra Thin Lancets MISC by Does not apply route.   Yes Historical Provider, MD  aspirin 81 MG tablet Take 81 mg by mouth every evening.     Historical Provider, MD  BIOTIN PO Take 1,000 mcg by mouth daily.     Historical Provider, MD  cephALEXin (KEFLEX) 250 MG capsule TAKE ONE CAPSULE BY MOUTH EVERY NIGHT AT BEDTIME TO PREVENT UTI 11/20/14   Tiffany L Reed, DO  Cholecalciferol (VITAMIN D3) 2000 UNITS TABS Take 1 tablet by mouth daily.     Historical Provider, MD  clopidogrel (PLAVIX) 75 MG tablet TAKE ONE TABLET BY MOUTH EVERY DAY 12/30/14   [name], MD  Cranberry 500 MG CAPS Take 500 mg by mouth  daily.     Historical Provider, MD  docusate sodium (COLACE) 100 MG capsule Take 100 mg by mouth 2 (two) times daily.    Historical Provider, MD  doxycycline (VIBRA-TABS) 100 MG tablet Take one tablet twice daily for urinary infection 10/14/14   Tiffany L Reed, DO  folic acid (FOLVITE) Q000111Q MCG tablet Take 800 mcg by mouth daily.     Historical Provider, MD  furosemide (LASIX) 20 MG tablet TAKE 1 TABLET BY MOUTH DAILY 08/23/14   [name], MD  gabapentin (NEURONTIN) 100 MG capsule Take 100 mg by mouth 3 (three) times daily.    Historical Provider, MD  iron polysaccharides (NIFEREX) 150 MG capsule Take 1 capsule (150 mg total) by mouth daily. 01/10/15    Tiffany L Reed, DO  levothyroxine (SYNTHROID, LEVOTHROID) 125 MCG tablet Take 1 tablet (125 mcg total) by mouth daily before breakfast. 01/10/15   Tiffany L Reed, DO  loperamide (IMODIUM) 2 MG capsule Take 1 capsule (2 mg total) by mouth 4 (four) times daily as needed for diarrhea or loose stools. 01/25/15   Nat Christen, MD  metoprolol (LOPRESSOR) 100 MG tablet Take 1 tablet (100 mg total) by mouth 2 (two) times daily. 03/13/14   [name], MD  Multiple Vitamin (MULTIVITAMIN WITH MINERALS) TABS tablet Take 1 tablet by mouth daily.    Historical Provider, MD  ondansetron (ZOFRAN) 4 MG tablet Take 1 tablet (4 mg total) by mouth every 6 (six) hours. 01/25/15   Nat Christen, MD  pilocarpine (SALAGEN) 5 MG tablet Take 5 mg by mouth as needed.  01/25/14   Historical Provider, MD  Probiotic Product (PROBIOTIC PO) Take 1 tablet by mouth daily.    Historical Provider, MD  rasagiline (AZILECT) 1 MG TABS Take 1 mg by mouth daily.    Historical Provider, MD  Rotigotine 8 MG/24HR PT24 Place 1 patch onto the skin daily.     Historical Provider, MD  spironolactone (ALDACTONE) 25 MG tablet Take 1 tablet (25 mg total) by mouth daily. 03/13/14   [name], MD   BP 116/52 mmHg  Pulse 80  Temp(Src) 97.9 F (36.6 C) (Oral)  Resp 16  SpO2 96% Physical Exam  Constitutional: She is oriented to person, place, and time.  Frail, appears dehydrated  HENT:  Head: Normocephalic and atraumatic.  Eyes: Conjunctivae and EOM are normal. Pupils are equal, round, and reactive to light.  Neck: Normal range of motion. Neck supple.  Cardiovascular: Normal rate and regular rhythm.   Pulmonary/Chest: Effort normal and breath sounds normal.  Abdominal: Soft. Bowel sounds are normal.  Minimal generalized tenderness.  Musculoskeletal: Normal range of motion.  Neurological: She is alert and oriented to person, place, and time.  Skin: Skin is warm and dry.  Psychiatric: She has a normal mood and affect. Her behavior is  normal.  Nursing note and vitals reviewed.   ED Course  Procedures (including critical care time) Labs Review Labs Reviewed  C DIFFICILE QUICK SCREEN W PCR REFLEX - Abnormal; Notable for the following:    C Diff antigen POSITIVE (*)    All other components within normal limits  COMPREHENSIVE METABOLIC PANEL - Abnormal; Notable for the following:    CO2 21 (*)    Glucose, Bld 132 (*)    BUN 39 (*)    Creatinine, Ser 1.05 (*)    Total Protein 8.4 (*)    AST 75 (*)    ALT 78 (*)    Alkaline Phosphatase 176 (*)  GFR calc non Af Amer 47 (*)    GFR calc Af Amer 54 (*)    All other components within normal limits  CBC - Abnormal; Notable for the following:    WBC 12.1 (*)    RDW 18.5 (*)    Platelets 99 (*)    All other components within normal limits  LIPASE, BLOOD    Imaging Review Ct Abdomen Pelvis W Contrast  01/25/2015  CLINICAL DATA:  Patient c/o mid abdominal pain, N/V/D since 0800 today. EXAM: CT ABDOMEN AND PELVIS WITH CONTRAST TECHNIQUE: Multidetector CT imaging of the abdomen and pelvis was performed using the standard protocol following bolus administration of intravenous contrast. CONTRAST:  109mL OMNIPAQUE IOHEXOL 300 MG/ML  SOLN COMPARISON:  None. FINDINGS: Lung bases: Mild interstitial thickening. Mild subsegmental atelectasis. No evidence of pneumonia or edema. No pleural effusion. Heart mildly enlarged. Liver and spleen:  Normal. Gallbladder and biliary tree: Gallbladder surgically absent. No bile duct dilation. Pancreas:  Unremarkable. Adrenal glands:  No masses. Kidneys, ureters, bladder: Bilateral renal cortical thinning. No renal masses or stones. No hydronephrosis. Normal ureters. Bladder is unremarkable. Uterus and adnexa:  Normal. Lymph nodes:  No adenopathy. Ascites:  None. Vascular: Atherosclerotic calcifications noted throughout the abdominal aorta and iliac arteries. No aneurysm. Gastrointestinal: Stomach is moderately distended but otherwise unremarkable.  Normal small bowel. Colon is mostly decompressed. No convincing wall thickening. No inflammatory changes. Normal appendix visualized. Musculoskeletal: Severe compression fracture of L1, which appears chronic. No other fractures. Degenerative changes noted throughout the visualized spine. Bones are diffusely demineralized. IMPRESSION: 1. No acute findings within the abdomen or pelvis. 2. Colon is mostly decompressed. There is no convincing colonic wall thickening or mesenteric inflammation. 3. Status post cholecystectomy. 4. Mild bilateral renal cortical thinning. 5. Degenerative changes of spine and chronic compression fracture of L1. Electronically Signed   By: Lajean Manes M.D.   On: 01/25/2015 15:29   I have personally reviewed and evaluated these images and lab results as part of my medical decision-making.   EKG Interpretation None      MDM   Final diagnoses:  Gastroenteritis    No acute abdomen. CT scan shows no acute findings in the abdomen or pelvis. She feels better after IV fluids.  Discharge medications Lomotil and Zofran 4 mg. Discussed findings with the patient and her daughter.    Nat Christen, MD 01/26/15 [phone]

## 2015-01-25 NOTE — Discharge Instructions (Signed)
Clear liquids for the next 12 hours. Medication for diarrhea and nausea. Return if worse

## 2015-01-27 ENCOUNTER — Emergency Department (HOSPITAL_COMMUNITY)
Admission: EM | Admit: 2015-01-27 | Discharge: 2015-01-27 | Disposition: A | Discharge status: Home / Self Care | Payer: Medicare Other | Attending: Emergency Medicine | Admitting: Emergency Medicine

## 2015-01-27 ENCOUNTER — Encounter (HOSPITAL_COMMUNITY): Payer: Self-pay

## 2015-01-27 ENCOUNTER — Emergency Department (HOSPITAL_COMMUNITY): Payer: Medicare Other

## 2015-01-27 ENCOUNTER — Telehealth: Payer: Self-pay

## 2015-01-27 DIAGNOSIS — Z79899 Other long term (current) drug therapy: Secondary | ICD-10-CM | POA: Diagnosis not present

## 2015-01-27 DIAGNOSIS — Z7982 Long term (current) use of aspirin: Secondary | ICD-10-CM | POA: Insufficient documentation

## 2015-01-27 DIAGNOSIS — I129 Hypertensive chronic kidney disease with stage 1 through stage 4 chronic kidney disease, or unspecified chronic kidney disease: Secondary | ICD-10-CM | POA: Diagnosis not present

## 2015-01-27 DIAGNOSIS — Z880 Allergy status to penicillin: Secondary | ICD-10-CM | POA: Diagnosis not present

## 2015-01-27 DIAGNOSIS — E039 Hypothyroidism, unspecified: Secondary | ICD-10-CM | POA: Insufficient documentation

## 2015-01-27 DIAGNOSIS — D649 Anemia, unspecified: Secondary | ICD-10-CM | POA: Diagnosis not present

## 2015-01-27 DIAGNOSIS — Y92000 Kitchen of unspecified non-institutional (private) residence as  the place of occurrence of the external cause: Secondary | ICD-10-CM | POA: Diagnosis not present

## 2015-01-27 DIAGNOSIS — Z7902 Long term (current) use of antithrombotics/antiplatelets: Secondary | ICD-10-CM | POA: Insufficient documentation

## 2015-01-27 DIAGNOSIS — Z8673 Personal history of transient ischemic attack (TIA), and cerebral infarction without residual deficits: Secondary | ICD-10-CM | POA: Diagnosis not present

## 2015-01-27 DIAGNOSIS — Z87891 Personal history of nicotine dependence: Secondary | ICD-10-CM | POA: Insufficient documentation

## 2015-01-27 DIAGNOSIS — Y9389 Activity, other specified: Secondary | ICD-10-CM | POA: Diagnosis not present

## 2015-01-27 DIAGNOSIS — G20 Parkinson's disease: Secondary | ICD-10-CM | POA: Insufficient documentation

## 2015-01-27 DIAGNOSIS — S79911A Unspecified injury of right hip, initial encounter: Secondary | ICD-10-CM | POA: Diagnosis not present

## 2015-01-27 DIAGNOSIS — W19XXXA Unspecified fall, initial encounter: Secondary | ICD-10-CM

## 2015-01-27 DIAGNOSIS — N183 Chronic kidney disease, stage 3 (moderate): Secondary | ICD-10-CM | POA: Diagnosis not present

## 2015-01-27 DIAGNOSIS — Z853 Personal history of malignant neoplasm of breast: Secondary | ICD-10-CM | POA: Diagnosis not present

## 2015-01-27 DIAGNOSIS — W01198A Fall on same level from slipping, tripping and stumbling with subsequent striking against other object, initial encounter: Secondary | ICD-10-CM | POA: Insufficient documentation

## 2015-01-27 DIAGNOSIS — M25551 Pain in right hip: Secondary | ICD-10-CM | POA: Diagnosis not present

## 2015-01-27 DIAGNOSIS — Y998 Other external cause status: Secondary | ICD-10-CM | POA: Diagnosis not present

## 2015-01-27 DIAGNOSIS — S0003XA Contusion of scalp, initial encounter: Secondary | ICD-10-CM | POA: Diagnosis not present

## 2015-01-27 DIAGNOSIS — S0990XA Unspecified injury of head, initial encounter: Secondary | ICD-10-CM

## 2015-01-27 LAB — I-STAT CHEM 8, ED
BUN: 38 mg/dL — ABNORMAL HIGH (ref 6–20)
Calcium, Ion: 1.16 mmol/L (ref 1.13–1.30)
Chloride: 104 mmol/L (ref 101–111)
Creatinine, Ser: 1.3 mg/dL — ABNORMAL HIGH (ref 0.44–1.00)
Glucose, Bld: 105 mg/dL — ABNORMAL HIGH (ref 65–99)
HEMATOCRIT: 33 % — AB (ref 36.0–46.0)
HEMOGLOBIN: 11.2 g/dL — AB (ref 12.0–15.0)
POTASSIUM: 4.4 mmol/L (ref 3.5–5.1)
SODIUM: 138 mmol/L (ref 135–145)
TCO2: 23 mmol/L (ref 0–100)

## 2015-01-27 MED ORDER — SODIUM CHLORIDE 0.9 % IV BOLUS (SEPSIS)
500.0000 mL | Freq: Once | INTRAVENOUS | Status: AC
  Administered 2015-01-27: 500 mL via INTRAVENOUS

## 2015-01-27 NOTE — ED Notes (Signed)
Bed: [id number] Expected date:  Expected time:  Means of arrival:  Comments: Fall, head injury

## 2015-01-27 NOTE — ED Notes (Signed)
Per EMS, pt from home.  Pt fell this am striking head.  Pt is on blood thinners.  Oriented to baseline at this time.  Pt c/o rt hip pain.  No deformity noted.  Pt ambulatory on scene.  Bruising noted to rt side of head.  Dtr was with pt during fall.  Pt has baseline balance issues.  Pt walks with walker.  Has parkinson's.  Vitals: 108/74, hr 78, resp 18,

## 2015-01-27 NOTE — ED Notes (Signed)
Ambulated patient 40 ft down hallway and back. No difficulty with ambulation, needed no extra assistance, no pain in hip. Left note for provider who stopped by to see patient shortly after.

## 2015-01-27 NOTE — Telephone Encounter (Signed)
Daughter Annetta Maw called, Patient went to ER 11/19 for nausea, vomiting  and diarrhea. Was given Lomotil, Zofran. Went to ER today for falling and diarrhea. Trying to follow bland diet most of the time. Doesn't know what to do. Route message to Dr. Mariea Clonts.

## 2015-01-29 ENCOUNTER — Telehealth: Payer: Self-pay | Admitting: *Deleted

## 2015-01-29 ENCOUNTER — Other Ambulatory Visit: Payer: Self-pay | Admitting: *Deleted

## 2015-01-29 MED ORDER — METRONIDAZOLE 500 MG PO TABS: 500.0000 mg | ORAL_TABLET | Freq: Four times a day (QID) | ORAL | Status: DC

## 2015-01-29 NOTE — ED Provider Notes (Signed)
CSN: [id number]     Arrival date & time 01/27/15  0905 History   First MD Initiated Contact with Patient 01/27/15 [phone]     Chief Complaint  Patient presents with  . Hip Pain  . Fall      The history is provided by the patient and a relative.   Patient is brought to the emergency department today after falling and striking her head.  She fell backwards while at the kitchen sink.  She's been ambulatory since the fall.  She reports bruising to the right side of her head as well as discomfort and pain in her right hip.  She normally walks with a walker.  She has a history of Parkinson's disease.  The patient is on Plavix.  No recent illness.  No other complaints.  Denies chest pain shortness breath.  No abdominal pain.  Denies back pain.    Past Medical History  Diagnosis Date  . Hypertension   . Parkinson's disease   . Hyperlipidemia   . Dizziness   . Orthostatic hypotension   . Anemia   . Tremor   . Breast cancer ([place])     LEFT BREAST  . Kidney failure     stage 3  . Hypothyroidism   . Prediabetes   . TIA (transient ischemic attack)   . Hyperglycemia   . Left ventricular diastolic dysfunction   . Hypophonia    Past Surgical History  Procedure Laterality Date  . Cardiovascular stress test  06/2007    NORMAL  . Breast lumpectomy  2010  . Cholecystectomy  1994  . Tonsillectomy    . Nasal sinus surgery      submucous resection late 1950s  . Cataract extraction  2005,2006    Dr.Devonzo   Family History  Problem Relation Age of Onset  . Heart disease Mother   . Heart failure Father   . Parkinsonism Father   . COPD Sister   . Cancer Brother   . Osteoporosis Daughter   . Diabetes Father   . Stroke Sister    Social History  Substance Use Topics  . Smoking status: Former Smoker -- 0.50 packs/day for 12 years    Types: Cigarettes    Quit date: 03/08/1964  . Smokeless tobacco: Never Used  . Alcohol Use: No   OB History    No data available     Review of  Systems  All other systems reviewed and are negative.     Allergies  Aciphex; Amantadines; Avapro; Carbidopa-levodopa; Cardura; Ciprofloxacin; Clonidine derivatives; Cozaar; Diltiazem; Famotidine; Hctz; Indapamide; Lansoprazole; Lisinopril; Loratadine; Maxidex; Mirapex; Norvasc; Pantoprazole sodium; Prilosec; Procardia; Proton pump inhibitors; Sulfa drugs cross reactors; Trimethoprim; Zyrtec; and Penicillins  Home Medications   Prior to Admission medications   Medication Sig Start Date End Date Taking? Authorizing Provider  AMBULATORY NON FORMULARY MEDICATION Outpatient Speech Therapy, evaluate and treat.  DX: G20, R49.8 04/25/14  Yes Tiffany L Reed, DO  aspirin 81 MG tablet Take 81 mg by mouth every evening.    Yes Historical Provider, MD  BIOTIN PO Take 1,000 mcg by mouth daily.    Yes Historical Provider, MD  cephALEXin (KEFLEX) 250 MG capsule TAKE ONE CAPSULE BY MOUTH EVERY NIGHT AT BEDTIME TO PREVENT UTI 11/20/14  Yes Tiffany L Reed, DO  Cholecalciferol (VITAMIN D3) 2000 UNITS TABS Take 1 tablet by mouth daily.    Yes Historical Provider, MD  clopidogrel (PLAVIX) 75 MG tablet TAKE ONE TABLET BY MOUTH EVERY DAY 12/30/14  Yes  [name], MD  Cranberry 500 MG CAPS Take 500 mg by mouth daily.    Yes Historical Provider, MD  docusate sodium (COLACE) 100 MG capsule Take 100 mg by mouth 2 (two) times daily.   Yes Historical Provider, MD  folic acid (FOLVITE) Q000111Q MCG tablet Take 800 mcg by mouth daily.    Yes Historical Provider, MD  furosemide (LASIX) 20 MG tablet TAKE 1 TABLET BY MOUTH DAILY 08/23/14  Yes [name], MD  gabapentin (NEURONTIN) 100 MG capsule Take 100 mg by mouth 3 (three) times daily as needed (pain).    Yes Historical Provider, MD  iron polysaccharides (NIFEREX) 150 MG capsule Take 1 capsule (150 mg total) by mouth daily. 01/10/15  Yes Tiffany L Reed, DO  levothyroxine (SYNTHROID, LEVOTHROID) 125 MCG tablet Take 1 tablet (125 mcg total) by mouth daily before  breakfast. 01/10/15  Yes Tiffany L Reed, DO  loperamide (IMODIUM) 2 MG capsule Take 1 capsule (2 mg total) by mouth 4 (four) times daily as needed for diarrhea or loose stools. 01/25/15  Yes Nat Christen, MD  metoprolol (LOPRESSOR) 100 MG tablet Take 1 tablet (100 mg total) by mouth 2 (two) times daily. 03/13/14  Yes [name], MD  Multiple Vitamin (MULTIVITAMIN WITH MINERALS) TABS tablet Take 1 tablet by mouth daily.   Yes Historical Provider, MD  ondansetron (ZOFRAN) 4 MG tablet Take 1 tablet (4 mg total) by mouth every 6 (six) hours. 01/25/15  Yes Nat Christen, MD  pilocarpine (SALAGEN) 5 MG tablet Take 5 mg by mouth daily as needed (showgrens- dryness).  01/25/14  Yes Historical Provider, MD  Polyethyl Glycol-Propyl Glycol (SYSTANE OP) Apply 1 drop to eye 2 (two) times daily as needed (dryness).   Yes Historical Provider, MD  Probiotic Product (PROBIOTIC PO) Take 1 tablet by mouth daily.   Yes Historical Provider, MD  rasagiline (AZILECT) 1 MG TABS Take 1 mg by mouth daily.   Yes Historical Provider, MD  Rotigotine 8 MG/24HR PT24 Place 1 patch onto the skin daily.    Yes Historical Provider, MD  spironolactone (ALDACTONE) 25 MG tablet Take 1 tablet (25 mg total) by mouth daily. 03/13/14  Yes [name], MD  doxycycline (VIBRA-TABS) 100 MG tablet Take one tablet twice daily for urinary infection Patient not taking: Reported on 01/27/2015 10/14/14   Tiffany L Reed, DO  rosuvastatin (CRESTOR) 10 MG tablet Take 1 tablet by mouth daily. 12/30/14   Historical Provider, MD   BP 121/62 mmHg  Pulse 82  Temp(Src) 98.2 F (36.8 C) (Oral)  Resp 16  SpO2 96% Physical Exam  Constitutional: She is oriented to person, place, and time. She appears well-developed and well-nourished. No distress.  HENT:  Small bruise to her right parietal scalp without abrasion or bleeding.  No laceration  Eyes: EOM are normal.  Neck: Normal range of motion.  Cardiovascular: Normal rate, regular rhythm and normal heart  sounds.   Pulmonary/Chest: Effort normal and breath sounds normal.  Abdominal: Soft. She exhibits no distension. There is no tenderness.  Musculoskeletal:  Full range of motion of bilateral hips and knees.  Mild pain with range of motion of the right hip.  Normal pulses bilaterally.  No obvious deformities of the lower extremities.  Normal strength in bilateral upper extremities.  Normal range of motion of bilateral shoulders, elbows, wrists.  Neurological: She is alert and oriented to person, place, and time.  Skin: Skin is warm and dry.  Psychiatric: She has a normal mood and affect.  Judgment normal.  Nursing note and vitals reviewed.   ED Course  Procedures (including critical care time) Labs Review Labs Reviewed  I-STAT CHEM 8, ED - Abnormal; Notable for the following:    BUN 38 (*)    Creatinine, Ser 1.30 (*)    Glucose, Bld 105 (*)    Hemoglobin 11.2 (*)    HCT 33.0 (*)    All other components within normal limits   BUN  Date Value Ref Range Status  01/27/2015 38* 6 - 20 mg/dL Final  01/25/2015 39* 6 - 20 mg/dL Final  01/08/2015 29* 8 - 27 mg/dL Final  11/28/2014 37* 8 - 27 mg/dL Final  10/08/2014 34* 8 - 27 mg/dL Final  04/08/2014 23 8 - 27 mg/dL Final  12/26/2013 32* 6 - 23 mg/dL Final  12/25/2013 31* 6 - 23 mg/dL Final        Imaging Review Ct Head Wo Contrast  01/27/2015  CLINICAL DATA:  Fall this morning, striking head. Patient on blood thinners. Bruising right side of head. EXAM: CT HEAD WITHOUT CONTRAST TECHNIQUE: Contiguous axial images were obtained from the base of the skull through the vertex without intravenous contrast. COMPARISON:  03/01/2014 FINDINGS: There is atrophy and chronic small vessel disease changes. No acute intracranial abnormality. Specifically, no hemorrhage, hydrocephalus, mass lesion, acute infarction, or significant intracranial injury. No acute calvarial abnormality. Visualized paranasal sinuses and mastoids clear. Orbital soft tissues  unremarkable. IMPRESSION: No acute intracranial abnormality. Atrophy, chronic microvascular disease. Electronically Signed   By: Rolm Baptise M.D.   On: 01/27/2015 10:20   Dg Hip Unilat With Pelvis 2-3 Views Right  01/27/2015  CLINICAL DATA:  Fall, right hip pain EXAM: DG HIP (WITH OR WITHOUT PELVIS) 2-3V RIGHT COMPARISON:  None. FINDINGS: Three views of the right hip submitted. No acute fracture or subluxation. Mild degenerative changes bilateral hip joints with bilateral superior acetabular spurring. Mild degenerative changes pubic symphysis. IMPRESSION: No acute fracture or subluxation. Mild degenerative changes bilateral hip joints. Electronically Signed   By: Lahoma Crocker M.D.   On: 01/27/2015 10:12   I have personally reviewed and evaluated these images and lab results as part of my medical decision-making.   EKG Interpretation None      MDM   Final diagnoses:  Fall, initial encounter  Minor head injury, initial encounter    Minor head injury.  Given her use of Plavix CT imaging was completed.  C-spine is nontender.  X-ray of her right hip is without acute fracture or subluxation.  Patient has been ambulatory in the emergency department.  Discharge home in good condition with primary care follow-up as needed.  Patient and family understand to return to the ER for new or worsening symptoms    Jola Schmidt, MD 01/29/15 [phone]

## 2015-01-29 NOTE — Telephone Encounter (Signed)
At this point, I still want her to take flagyl. Let's get it called in so she can start it asap.

## 2015-01-29 NOTE — Telephone Encounter (Signed)
Patient daughter called regarding her mother's diarrhea episodes, she stated that Dr. Mariea Clonts wanted to know how often and how many times. Patient's daughter stated that she continues to have diarrhea about every 2 hours, and that she needed to go home with her kids, so she call [facility] to assist her with the mother.

## 2015-01-29 NOTE — Telephone Encounter (Signed)
Spoke with patient's daughter to inform her that the script has been sent to the [facility].

## 2015-01-29 NOTE — Telephone Encounter (Signed)
Patient daughter called to say that the nurse who came out from [facility] stated that it doesn't smell like C-diff, because that has a distinctive smell. She thought that you would want to know that little bit of information and she understands that she is not a nurse that you know well.

## 2015-01-29 NOTE — Telephone Encounter (Signed)
Please see staff message indicating need for flagyl and if she is not improving, visit to hospital for IVFs.

## 2015-02-06 ENCOUNTER — Ambulatory Visit (INDEPENDENT_AMBULATORY_CARE_PROVIDER_SITE_OTHER): Payer: Medicare Other | Admitting: Internal Medicine

## 2015-02-06 VITALS — BP 140/80 | HR 85 | Temp 97.5°F | Resp 18 | Wt 119.4 lb

## 2015-02-06 DIAGNOSIS — A09 Infectious gastroenteritis and colitis, unspecified: Secondary | ICD-10-CM

## 2015-02-06 DIAGNOSIS — T3795XA Adverse effect of unspecified systemic anti-infective and antiparasitic, initial encounter: Secondary | ICD-10-CM

## 2015-02-06 DIAGNOSIS — B370 Candidal stomatitis: Secondary | ICD-10-CM | POA: Diagnosis not present

## 2015-02-06 DIAGNOSIS — L270 Generalized skin eruption due to drugs and medicaments taken internally: Secondary | ICD-10-CM | POA: Diagnosis not present

## 2015-02-06 MED ORDER — NYSTATIN 100000 UNIT/ML MT SUSP: 5.0000 mL | Freq: Four times a day (QID) | OROMUCOSAL | Status: DC

## 2015-02-06 NOTE — Progress Notes (Signed)
Patient ID: Victoria Lindsey, female   DOB: Nov 03, 1925, 81 y.o.   MRN: [id number]   Location: [facility] Provider: Rexene Edison. Mariea Clonts, D.O., C.M.D.  Code Status: DNR Goals of Care: Advanced Directive information Does patient have an advance directive?: Yes, Type of Advance Directive: Living will;Healthcare Power of [place];Out of facility DNR (pink MOST or yellow form), Pre-existing out of facility DNR order (yellow form or pink MOST form): Yellow form placed in chart (order not valid for inpatient use), Does patient want to make changes to advanced directive?: No - Patient declined  Chief Complaint  Patient presents with  . Acute Visit    ? allergic reaction to flagyl(11-23/16) diarrhea is non-existent    HPI: Patient is a 81 y.o. female seen in the office today for an acute visit due to a rash on the left side of her face with associated swelling, rash also on trunk and arms, pruritic.  Areas where she has previously applied her patches on her back are now outlined in red and she has hives on her lower back.  Her left side of her lip is swollen.  Apparently, early this am, her facial swelling and redness were much more dramatic and her daughter could barely recognize her.  She also had itching in her throat.    This is after she's been on flagyl since 11/23 for diarrhea suspicious for c diff.  It got much better after abx started and had not improved for about 5 days w/o them.  c diff ag had been positive but pcr had been negative at hospital.  Apparently home health nurse felt the diarrhea did not smell like c diff, but it was watery and some days 10 stools per day.    Review of Systems:  Review of Systems  Constitutional: Negative for fever, chills and malaise/fatigue.  HENT: Positive for sore throat.        Throat itching; mouth sore and peeling  Eyes: Negative for blurred vision.  Respiratory: Negative for shortness of breath.   Cardiovascular: Negative for chest pain.    Gastrointestinal: Negative for nausea, vomiting, abdominal pain, diarrhea, constipation, blood in stool and melena.       Some irritation of hemorrhoids  Genitourinary: Negative for dysuria.       Irritation of bladder when she has to go has resolved and she's not on the prophylactic abx anymore  Musculoskeletal: Negative for falls.  Skin: Positive for itching and rash.  Neurological: Positive for tremors. Negative for dizziness, loss of consciousness and weakness.  Endo/Heme/Allergies: Bruises/bleeds easily.  Psychiatric/Behavioral: Positive for memory loss.    Past Medical History  Diagnosis Date  . Hypertension   . Parkinson's disease   . Hyperlipidemia   . Dizziness   . Orthostatic hypotension   . Anemia   . Tremor   . Breast cancer ([place])     LEFT BREAST  . Kidney failure     stage 3  . Hypothyroidism   . Prediabetes   . TIA (transient ischemic attack)   . Hyperglycemia   . Left ventricular diastolic dysfunction   . Hypophonia     Past Surgical History  Procedure Laterality Date  . Cardiovascular stress test  06/2007    NORMAL  . Breast lumpectomy  2010  . Cholecystectomy  1994  . Tonsillectomy    . Nasal sinus surgery      submucous resection late 1950s  . Cataract extraction  2005,2006    Dr.Devonzo  Allergies  Allergen Reactions  . Aciphex [Rabeprazole Sodium]     unknown  . Amantadines     unknown  . Avapro [Irbesartan]     unknown  . Carbidopa-Levodopa     unknown  . Cardura [Doxazosin Mesylate]     unknown  . Ciprofloxacin     unknown  . Clonidine Derivatives     unknown  . Cozaar     nightmares  . Diltiazem     unknown  . Famotidine     unknown  . Hctz [Hydrochlorothiazide]     unknown  . Indapamide     unknown  . Lansoprazole     unknown  . Lisinopril     Mouth problems  . Loratadine     unknown  . Maxidex [Dexamethasone]     unknown  . Mirapex [Pramipexole Dihydrochloride]     unknown  . Norvasc [Amlodipine Besylate]      unknown  . Pantoprazole Sodium     unknown  . Prilosec [Omeprazole]     unknown  . Procardia [Nifedipine]     unknown  . Proton Pump Inhibitors     unknown  . Sulfa Drugs Cross Reactors     unknown  . Trimethoprim     unknown  . Zyrtec [Cetirizine Hcl]     unknown  . Penicillins Rash    Has patient had a PCN reaction causing immediate rash, facial/tongue/throat swelling, SOB or lightheadedness with hypotension: just a rash all over Has patient had a PCN reaction causing severe rash involving mucus membranes or skin necrosis: no Has patient had a PCN reaction that required hospitalization-no Has patient had a PCN reaction occurring within the last 10 years: no, more than 50 yrs ago If all of the above answers are "NO", then may proceed with Cephalosporin use.       Medication List       This list is accurate as of: 02/06/15 11:59 PM.  Always use your most recent med list.               AMBULATORY NON FORMULARY MEDICATION  Outpatient Speech Therapy, evaluate and treat.  DX: G20, R49.8     aspirin 81 MG tablet  Take 81 mg by mouth every evening.     AZILECT 1 MG Tabs tablet  Generic drug:  rasagiline  Take 1 mg by mouth daily.     BIOTIN PO  Take 1,000 mcg by mouth daily.     clopidogrel 75 MG tablet  Commonly known as:  PLAVIX  TAKE ONE TABLET BY MOUTH EVERY DAY     Cranberry 500 MG Caps  Take 500 mg by mouth daily.     docusate sodium 100 MG capsule  Commonly known as:  COLACE  Take 100 mg by mouth 2 (two) times daily.     folic acid Q000111Q MCG tablet  Commonly known as:  FOLVITE  Take 800 mcg by mouth daily.     furosemide 20 MG tablet  Commonly known as:  LASIX  TAKE 1 TABLET BY MOUTH DAILY     gabapentin 100 MG capsule  Commonly known as:  NEURONTIN  Take 100 mg by mouth 3 (three) times daily as needed (pain).     iron polysaccharides 150 MG capsule  Commonly known as:  NIFEREX  Take 1 capsule (150 mg total) by mouth daily.     levothyroxine  125 MCG tablet  Commonly known as:  SYNTHROID  Take 1 tablet (125  mcg total) by mouth daily before breakfast.     metoprolol 100 MG tablet  Commonly known as:  LOPRESSOR  Take 1 tablet (100 mg total) by mouth 2 (two) times daily.     multivitamin with minerals Tabs tablet  Take 1 tablet by mouth daily.     nystatin 100000 UNIT/ML suspension  Commonly known as:  MYCOSTATIN  Take 5 mLs (500,000 Units total) by mouth 4 (four) times daily.     ondansetron 4 MG tablet  Commonly known as:  ZOFRAN  Take 1 tablet (4 mg total) by mouth every 6 (six) hours.     pilocarpine 5 MG tablet  Commonly known as:  SALAGEN  Take 5 mg by mouth daily as needed (showgrens- dryness).     PROBIOTIC PO  Take 1 tablet by mouth daily.     rosuvastatin 10 MG tablet  Commonly known as:  CRESTOR  Take 1 tablet by mouth daily.     Rotigotine 8 MG/24HR Pt24  Place 1 patch onto the skin daily.     spironolactone 25 MG tablet  Commonly known as:  ALDACTONE  Take 1 tablet (25 mg total) by mouth daily.     SYSTANE OP  Apply 1 drop to eye 2 (two) times daily as needed (dryness).     Vitamin D3 2000 UNITS Tabs  Take 1 tablet by mouth daily.        Health Maintenance  Topic Date Due  . OPHTHALMOLOGY EXAM  02/04/1938  . PNA vac Low Risk Adult (2 of 2 - PCV13) 05/24/2014  . MAMMOGRAM  03/08/2048 (Originally 03/08/2012)  . HEMOGLOBIN A1C  07/08/2015  . INFLUENZA VACCINE  10/07/2015  . FOOT EXAM  10/08/2015  . URINE MICROALBUMIN  10/11/2015  . TETANUS/TDAP  02/08/2022  . DEXA SCAN  Completed  . ZOSTAVAX  Completed    Physical Exam: Filed Vitals:   02/06/15 1643  BP: 140/80  Pulse: 85  Temp: 97.5 F (36.4 C)  TempSrc: Oral  Resp: 18  Weight: 119 lb 6.4 oz (54.159 kg)  SpO2: 94%   Body mass index is 22.57 kg/(m^2). Physical Exam  Constitutional: She appears well-nourished. No distress.  HENT:  Head: Normocephalic and atraumatic.  Cardiovascular: Normal rate and regular rhythm.    Pulmonary/Chest: Effort normal and breath sounds normal.  Abdominal: Soft. Bowel sounds are normal.  Musculoskeletal:  Walks with walker  Neurological: She is alert.  Skin: Skin is warm and dry. Rash noted. There is erythema.  Erythema of left side of face, swelling of left side of lip, and mildly of whole face, hives on lower back, erythema at prior patch sites on her back; mild peripheral edema; oral mucosa is very erythematous with some peeling noted on roof of mouth  Psychiatric: She has a normal mood and affect.    Labs reviewed: Basic Metabolic Panel:  Recent Labs  05/22/14 0942 10/08/14 0832 11/28/14 1037 01/08/15 0806 01/25/15 1151 01/27/15 1033  NA  --  142 139 139 136 138  K  --  4.7 4.7 4.7 5.0 4.4  CL  --  100 100 99 104 104  CO2  --  23 25 25  21*  --   GLUCOSE  --  92 89 102* 132* 105*  BUN  --  34* 37* 29* 39* 38*  CREATININE  --  1.12* 1.02* 0.92 1.05* 1.30*  CALCIUM  --  8.9 9.2 9.1 9.6  --   TSH 4.080 5.220*  --  9.960*  --   --  Liver Function Tests:  Recent Labs  10/08/14 0832 01/08/15 0806 01/25/15 1151  AST 44* 46* 75*  ALT 42* 46* 78*  ALKPHOS 127* 154* 176*  BILITOT 0.3 0.3 0.4  PROT 6.7 6.8 8.4*  ALBUMIN 3.9 3.8 4.2    Recent Labs  01/25/15 1151  LIPASE 33   No results for input(s): AMMONIA in the last 8760 hours. CBC:  Recent Labs  04/08/14 1029  10/08/14 0832 11/25/14 1218 01/08/15 0806 01/25/15 1151 01/27/15 1033  WBC 7.0  --  6.2 7.2 6.3 12.1*  --   NEUTROABS 4.4  --  3.6 4.4 3.7  --   --   HGB 11.3  --   --   --   --  12.4 11.2*  HCT 36.7  < > 35.9 33.3* 32.8* 39.8 33.0*  MCV 96  --   --   --   --  96.6  --   PLT 130*  --   --   --   --  99*  --   < > = values in this interval not displayed. Lipid Panel:  Recent Labs  04/08/14 1029 10/08/14 0832 01/08/15 0806  CHOL 114 106 112  HDL 47 41 39*  LDLCALC 48 49 55  TRIG 96 81 89  CHOLHDL 2.4 2.6 2.9   Lab Results  Component Value Date   HGBA1C 7.0*  01/08/2015   Assessment/Plan 1. Oral thrush - due to flagyl and prophylactic abx for utis - d/c flagyl and cont off prophylaxis for utis - nystatin (MYCOSTATIN) 100000 UNIT/ML suspension; Take 5 mLs (500,000 Units total) by mouth 4 (four) times daily.  Dispense: 180 mL; Refill: 1  2. Allergic drug rash due to anti-infective agent -d/c flagyl and added to allergies (had 25 before!) -cont to monitor, but already improving after skipping midday dosage after her daughter saw the rash and swelling  3. Diarrhea of infectious origin -suspect this was c diff due to dramatic improvement with flagyl use and risk factors including use of prophylactic abx for UTI, time at hospital visiting her husband who had hernia surgery, age -did not have full course of flagyl, but symptoms resolved by the time she developed the drug reaction  Next appt:  04/18/2015 as scheduled  Nakaiya Beddow L. Morrill Bomkamp, D.O. [facility] [address], [place] [address] Cell Phone (Mon-Fri 8am-5pm):  [phone] On Call:  [phone] & follow prompts after 5pm & weekends Office Phone:  [phone] Office Fax:  [phone]

## 2015-02-09 ENCOUNTER — Encounter: Payer: Self-pay | Admitting: Internal Medicine

## 2015-02-12 ENCOUNTER — Encounter (HOSPITAL_COMMUNITY): Payer: Self-pay | Admitting: Emergency Medicine

## 2015-02-12 ENCOUNTER — Telehealth: Payer: Self-pay | Admitting: *Deleted

## 2015-02-12 ENCOUNTER — Emergency Department (HOSPITAL_COMMUNITY): Payer: Medicare Other

## 2015-02-12 ENCOUNTER — Inpatient Hospital Stay (HOSPITAL_COMMUNITY)
Admission: EM | Admit: 2015-02-12 | Discharge: 2015-02-14 | DRG: 292 | Disposition: A | Discharge status: Skilled Nursing Facility | Payer: Medicare Other | Attending: Internal Medicine | Admitting: Internal Medicine

## 2015-02-12 DIAGNOSIS — D696 Thrombocytopenia, unspecified: Secondary | ICD-10-CM | POA: Diagnosis present

## 2015-02-12 DIAGNOSIS — Z853 Personal history of malignant neoplasm of breast: Secondary | ICD-10-CM

## 2015-02-12 DIAGNOSIS — N189 Chronic kidney disease, unspecified: Secondary | ICD-10-CM | POA: Diagnosis not present

## 2015-02-12 DIAGNOSIS — Z8673 Personal history of transient ischemic attack (TIA), and cerebral infarction without residual deficits: Secondary | ICD-10-CM

## 2015-02-12 DIAGNOSIS — I129 Hypertensive chronic kidney disease with stage 1 through stage 4 chronic kidney disease, or unspecified chronic kidney disease: Secondary | ICD-10-CM | POA: Diagnosis not present

## 2015-02-12 DIAGNOSIS — I110 Hypertensive heart disease with heart failure: Principal | ICD-10-CM | POA: Diagnosis present

## 2015-02-12 DIAGNOSIS — Z79899 Other long term (current) drug therapy: Secondary | ICD-10-CM | POA: Diagnosis not present

## 2015-02-12 DIAGNOSIS — M3500 Sicca syndrome, unspecified: Secondary | ICD-10-CM | POA: Diagnosis present

## 2015-02-12 DIAGNOSIS — E039 Hypothyroidism, unspecified: Secondary | ICD-10-CM | POA: Diagnosis not present

## 2015-02-12 DIAGNOSIS — R488 Other symbolic dysfunctions: Secondary | ICD-10-CM | POA: Diagnosis not present

## 2015-02-12 DIAGNOSIS — Z7902 Long term (current) use of antithrombotics/antiplatelets: Secondary | ICD-10-CM | POA: Diagnosis not present

## 2015-02-12 DIAGNOSIS — R0602 Shortness of breath: Secondary | ICD-10-CM | POA: Diagnosis not present

## 2015-02-12 DIAGNOSIS — I7300 Raynaud's syndrome without gangrene: Secondary | ICD-10-CM | POA: Diagnosis present

## 2015-02-12 DIAGNOSIS — E162 Hypoglycemia, unspecified: Secondary | ICD-10-CM | POA: Diagnosis not present

## 2015-02-12 DIAGNOSIS — R197 Diarrhea, unspecified: Secondary | ICD-10-CM | POA: Diagnosis not present

## 2015-02-12 DIAGNOSIS — Z7982 Long term (current) use of aspirin: Secondary | ICD-10-CM | POA: Diagnosis not present

## 2015-02-12 DIAGNOSIS — Z9849 Cataract extraction status, unspecified eye: Secondary | ICD-10-CM

## 2015-02-12 DIAGNOSIS — G2581 Restless legs syndrome: Secondary | ICD-10-CM | POA: Diagnosis present

## 2015-02-12 DIAGNOSIS — I509 Heart failure, unspecified: Secondary | ICD-10-CM

## 2015-02-12 DIAGNOSIS — Z87891 Personal history of nicotine dependence: Secondary | ICD-10-CM | POA: Diagnosis not present

## 2015-02-12 DIAGNOSIS — Z9181 History of falling: Secondary | ICD-10-CM | POA: Diagnosis not present

## 2015-02-12 DIAGNOSIS — G20 Parkinson's disease: Secondary | ICD-10-CM | POA: Diagnosis present

## 2015-02-12 DIAGNOSIS — A047 Enterocolitis due to Clostridium difficile: Secondary | ICD-10-CM | POA: Diagnosis present

## 2015-02-12 DIAGNOSIS — R2681 Unsteadiness on feet: Secondary | ICD-10-CM | POA: Diagnosis not present

## 2015-02-12 DIAGNOSIS — E785 Hyperlipidemia, unspecified: Secondary | ICD-10-CM | POA: Diagnosis present

## 2015-02-12 DIAGNOSIS — N183 Chronic kidney disease, stage 3 (moderate): Secondary | ICD-10-CM | POA: Diagnosis not present

## 2015-02-12 DIAGNOSIS — I5033 Acute on chronic diastolic (congestive) heart failure: Secondary | ICD-10-CM | POA: Diagnosis present

## 2015-02-12 DIAGNOSIS — R1310 Dysphagia, unspecified: Secondary | ICD-10-CM

## 2015-02-12 DIAGNOSIS — R7303 Prediabetes: Secondary | ICD-10-CM | POA: Diagnosis present

## 2015-02-12 DIAGNOSIS — R1312 Dysphagia, oropharyngeal phase: Secondary | ICD-10-CM | POA: Diagnosis not present

## 2015-02-12 DIAGNOSIS — R0600 Dyspnea, unspecified: Secondary | ICD-10-CM | POA: Diagnosis not present

## 2015-02-12 DIAGNOSIS — M6281 Muscle weakness (generalized): Secondary | ICD-10-CM | POA: Diagnosis not present

## 2015-02-12 LAB — CBC
HEMATOCRIT: 35.4 % — AB (ref 36.0–46.0)
HEMOGLOBIN: 11 g/dL — AB (ref 12.0–15.0)
MCH: 29.6 pg (ref 26.0–34.0)
MCHC: 31.1 g/dL (ref 30.0–36.0)
MCV: 95.2 fL (ref 78.0–100.0)
Platelets: 128 10*3/uL — ABNORMAL LOW (ref 150–400)
RBC: 3.72 MIL/uL — ABNORMAL LOW (ref 3.87–5.11)
RDW: 19.7 % — AB (ref 11.5–15.5)
WBC: 4.5 10*3/uL (ref 4.0–10.5)

## 2015-02-12 LAB — BASIC METABOLIC PANEL
ANION GAP: 8 (ref 5–15)
BUN: 26 mg/dL — AB (ref 6–20)
CHLORIDE: 98 mmol/L — AB (ref 101–111)
CO2: 28 mmol/L (ref 22–32)
Calcium: 9 mg/dL (ref 8.9–10.3)
Creatinine, Ser: 0.97 mg/dL (ref 0.44–1.00)
GFR calc Af Amer: 59 mL/min — ABNORMAL LOW (ref >60)
GFR calc non Af Amer: 51 mL/min — ABNORMAL LOW (ref >60)
GLUCOSE: 169 mg/dL — AB (ref 65–99)
POTASSIUM: 4.8 mmol/L (ref 3.5–5.1)
Sodium: 134 mmol/L — ABNORMAL LOW (ref 135–145)

## 2015-02-12 LAB — BRAIN NATRIURETIC PEPTIDE: B NATRIURETIC PEPTIDE 5: 1814.9 pg/mL — AB (ref 0.0–100.0)

## 2015-02-12 LAB — I-STAT TROPONIN, ED: Troponin i, poc: 0.01 ng/mL (ref 0.00–0.08)

## 2015-02-12 MED ORDER — ONDANSETRON HCL 4 MG/2ML IJ SOLN: 4.0000 mg | Freq: Four times a day (QID) | INTRAMUSCULAR | Status: DC | PRN

## 2015-02-12 MED ORDER — SODIUM CHLORIDE 0.9 % IV SOLN: 250.0000 mL | INTRAVENOUS | Status: DC | PRN

## 2015-02-12 MED ORDER — PILOCARPINE HCL 5 MG PO TABS
5.0000 mg | ORAL_TABLET | Freq: Every day | ORAL | Status: DC | PRN
  Filled 2015-02-12: qty 1

## 2015-02-12 MED ORDER — ACETAMINOPHEN 325 MG PO TABS: 650.0000 mg | ORAL_TABLET | ORAL | Status: DC | PRN

## 2015-02-12 MED ORDER — POLYSACCHARIDE IRON COMPLEX 150 MG PO CAPS
150.0000 mg | ORAL_CAPSULE | Freq: Every evening | ORAL | Status: DC
  Administered 2015-02-12 – 2015-02-13 (×2): 150 mg via ORAL
  Filled 2015-02-12 (×2): qty 1

## 2015-02-12 MED ORDER — SPIRONOLACTONE 25 MG PO TABS
25.0000 mg | ORAL_TABLET | Freq: Every day | ORAL | Status: DC
  Administered 2015-02-13 – 2015-02-14 (×2): 25 mg via ORAL
  Filled 2015-02-12 (×2): qty 1

## 2015-02-12 MED ORDER — POLYETHYL GLYCOL-PROPYL GLYCOL 0.4-0.3 % OP GEL: Freq: Two times a day (BID) | OPHTHALMIC | Status: DC | PRN

## 2015-02-12 MED ORDER — METOPROLOL TARTRATE 12.5 MG HALF TABLET
12.5000 mg | ORAL_TABLET | Freq: Two times a day (BID) | ORAL | Status: DC
  Administered 2015-02-12: 12.5 mg via ORAL
  Filled 2015-02-12 (×2): qty 1

## 2015-02-12 MED ORDER — SODIUM CHLORIDE 0.9 % IJ SOLN: 3.0000 mL | INTRAMUSCULAR | Status: DC | PRN

## 2015-02-12 MED ORDER — FUROSEMIDE 10 MG/ML IJ SOLN
40.0000 mg | Freq: Two times a day (BID) | INTRAMUSCULAR | Status: DC
  Administered 2015-02-13: 40 mg via INTRAVENOUS
  Filled 2015-02-12: qty 4

## 2015-02-12 MED ORDER — HEPARIN SODIUM (PORCINE) 5000 UNIT/ML IJ SOLN
5000.0000 [IU] | Freq: Three times a day (TID) | INTRAMUSCULAR | Status: DC
  Administered 2015-02-12 – 2015-02-14 (×5): 5000 [IU] via SUBCUTANEOUS
  Filled 2015-02-12 (×6): qty 1

## 2015-02-12 MED ORDER — VITAMIN D 1000 UNITS PO TABS
1000.0000 [IU] | ORAL_TABLET | Freq: Every day | ORAL | Status: DC
  Administered 2015-02-13 – 2015-02-14 (×2): 1000 [IU] via ORAL
  Filled 2015-02-12 (×2): qty 1

## 2015-02-12 MED ORDER — LEVOTHYROXINE SODIUM 125 MCG PO TABS
125.0000 ug | ORAL_TABLET | Freq: Every day | ORAL | Status: DC
  Administered 2015-02-13 – 2015-02-14 (×2): 125 ug via ORAL
  Filled 2015-02-12 (×2): qty 1

## 2015-02-12 MED ORDER — ADULT MULTIVITAMIN W/MINERALS CH
1.0000 | ORAL_TABLET | Freq: Every day | ORAL | Status: DC
  Administered 2015-02-13 – 2015-02-14 (×2): 1 via ORAL
  Filled 2015-02-12 (×2): qty 1

## 2015-02-12 MED ORDER — FOLIC ACID 800 MCG PO TABS: 800.0000 ug | ORAL_TABLET | Freq: Every day | ORAL | Status: DC

## 2015-02-12 MED ORDER — ASPIRIN EC 81 MG PO TBEC
81.0000 mg | DELAYED_RELEASE_TABLET | Freq: Every evening | ORAL | Status: DC
  Administered 2015-02-12 – 2015-02-13 (×2): 81 mg via ORAL
  Filled 2015-02-12 (×2): qty 1

## 2015-02-12 MED ORDER — VITAMIN D3 50 MCG (2000 UT) PO TABS: 1.0000 | ORAL_TABLET | Freq: Every day | ORAL | Status: DC

## 2015-02-12 MED ORDER — POLYVINYL ALCOHOL 1.4 % OP SOLN
1.0000 [drp] | Freq: Two times a day (BID) | OPHTHALMIC | Status: DC | PRN
  Filled 2015-02-12: qty 15

## 2015-02-12 MED ORDER — FUROSEMIDE 10 MG/ML IJ SOLN
20.0000 mg | Freq: Once | INTRAMUSCULAR | Status: AC
  Administered 2015-02-12: 20 mg via INTRAVENOUS
  Filled 2015-02-12: qty 2

## 2015-02-12 MED ORDER — RASAGILINE MESYLATE 1 MG PO TABS
1.0000 mg | ORAL_TABLET | Freq: Every day | ORAL | Status: DC
  Administered 2015-02-13 – 2015-02-14 (×2): 1 mg via ORAL
  Filled 2015-02-12 (×3): qty 1

## 2015-02-12 MED ORDER — ROTIGOTINE 4 MG/24HR TD PT24
2.0000 | MEDICATED_PATCH | Freq: Every evening | TRANSDERMAL | Status: DC
  Administered 2015-02-12 – 2015-02-13 (×2): 2 via TRANSDERMAL
  Filled 2015-02-12 (×3): qty 2

## 2015-02-12 MED ORDER — FOLIC ACID 1 MG PO TABS
1.0000 mg | ORAL_TABLET | Freq: Every day | ORAL | Status: DC
  Administered 2015-02-13 – 2015-02-14 (×2): 1 mg via ORAL
  Filled 2015-02-12 (×2): qty 1

## 2015-02-12 MED ORDER — CLOPIDOGREL BISULFATE 75 MG PO TABS
75.0000 mg | ORAL_TABLET | Freq: Every evening | ORAL | Status: DC
  Administered 2015-02-12 – 2015-02-13 (×2): 75 mg via ORAL
  Filled 2015-02-12 (×2): qty 1

## 2015-02-12 MED ORDER — ROTIGOTINE 8 MG/24HR TD PT24: 1.0000 | MEDICATED_PATCH | Freq: Every evening | TRANSDERMAL | Status: DC

## 2015-02-12 MED ORDER — GABAPENTIN 100 MG PO CAPS: 100.0000 mg | ORAL_CAPSULE | Freq: Three times a day (TID) | ORAL | Status: DC | PRN

## 2015-02-12 MED ORDER — SODIUM CHLORIDE 0.9 % IJ SOLN
3.0000 mL | Freq: Two times a day (BID) | INTRAMUSCULAR | Status: DC
Start: 2015-02-12 — End: 2015-02-14
  Administered 2015-02-12 – 2015-02-14 (×4): 3 mL via INTRAVENOUS

## 2015-02-12 MED ORDER — VANCOMYCIN 50 MG/ML ORAL SOLUTION
125.0000 mg | Freq: Four times a day (QID) | ORAL | Status: DC
  Administered 2015-02-13 – 2015-02-14 (×3): 125 mg via ORAL
  Filled 2015-02-12 (×7): qty 2.5

## 2015-02-12 NOTE — H&P (Addendum)
Hospital Admission Note Date: 02/12/2015  Patient name: Victoria Lindsey Medical record number: [id number] Date of birth: 1925-05-07 Age: 81 y.o. Gender: female PCP: Hollace Kinnier, DO  Attending physician: Delfina Redwood, MD  Chief Complaint: shortness of breath  History of Present Illness:  Victoria Lindsey is an 81 y.o. female with history of diastolic dysfunction on echocardiogram, but no known history of heart failure, per chart review, recently treated for C. difficile colitis (C. difficile antigen positive, toxin negative), with recent allergic reaction to Flagyl, hypertension, Parkinson's disease, Sjogren's syndrome and Raynaud's disease who presents with shortness of breath. Patient's daughter reports that her weight increased by 2 pounds during her last doctor's visit. She's has seemed more short of breath for the past few days. No cough, fevers chills. She received about 6 days of Flagyl for C. difficile infection, but this was stopped after she developed an allergic reaction consisting of severe rash and throat itching. Her diarrhea resolved and then returned again last night. She denies abdominal pain. She has had nausea but no vomiting. Her appetite has been good. In the emergency room, her white blood cell count is normal. She is afebrile. Chest x-ray shows patchy infiltrates. BNP not done. Echocardiogram done in [id number] shows diastolic dysfunction and normal ejection fraction. She reportedly had acute pulmonary edema secondary to possibly aspiration pneumonitis, but could not rule out diastolic heart failure per chart review. Patient has been very weak. She was to be discharged home with increased home Lasix dose but while getting up to get ready, she became very short of breath and admission was recommended. Her oxygen saturations are normal. She reportedly had rales on exam and got a dose of Lasix. Her daughter feels that due to weakness, patient might need placement. She was admitted to  [facility] facility last year.  Past Medical History  Diagnosis Date  . Hypertension   . Parkinson's disease   . Hyperlipidemia   . Dizziness   . Orthostatic hypotension   . Anemia   . Tremor   . Breast cancer ([place])     LEFT BREAST  . Kidney failure     stage 3  . Hypothyroidism   . Prediabetes   . TIA (transient ischemic attack)   . Hyperglycemia   . Left ventricular diastolic dysfunction   . Hypophonia     Meds:    Medication List    ASK your doctor about these medications        AMBULATORY NON FORMULARY MEDICATION  Outpatient Speech Therapy, evaluate and treat.  DX: G20, R49.8     aspirin 81 MG tablet  Take 81 mg by mouth every evening.     AZILECT 1 MG Tabs tablet  Generic drug:  rasagiline  Take 1 mg by mouth daily.     BIOTIN PO  Take 1,000 mcg by mouth daily.     clopidogrel 75 MG tablet  Commonly known as:  PLAVIX  Take 75 mg by mouth every evening.     Cranberry 500 MG Caps  Take 500 mg by mouth daily.     docusate sodium 100 MG capsule  Commonly known as:  COLACE  Take 100 mg by mouth 2 (two) times daily.     folic acid Q000111Q MCG tablet  Commonly known as:  FOLVITE  Take 800 mcg by mouth daily.     furosemide 20 MG tablet  Commonly known as:  LASIX  TAKE 1 TABLET BY MOUTH DAILY  gabapentin 100 MG capsule  Commonly known as:  NEURONTIN  Take 100 mg by mouth 3 (three) times daily as needed (pain).     iron polysaccharides 150 MG capsule  Commonly known as:  NIFEREX  Take 150 mg by mouth every evening.     levothyroxine 125 MCG tablet  Commonly known as:  SYNTHROID  Take 1 tablet (125 mcg total) by mouth daily before breakfast.     metoprolol 100 MG tablet  Commonly known as:  LOPRESSOR  Take 1 tablet (100 mg total) by mouth 2 (two) times daily.     multivitamin with minerals Tabs tablet  Take 1 tablet by mouth daily.     nystatin 100000 UNIT/ML suspension  Commonly known as:  MYCOSTATIN  Take 5 mLs (500,000  Units total) by mouth 4 (four) times daily.     ondansetron 4 MG tablet  Commonly known as:  ZOFRAN  Take 1 tablet (4 mg total) by mouth every 6 (six) hours.     pilocarpine 5 MG tablet  Commonly known as:  SALAGEN  Take 5 mg by mouth daily as needed (showgrens- dryness).     PROBIOTIC PO  Take 1 tablet by mouth daily.     rosuvastatin 10 MG tablet  Commonly known as:  CRESTOR  Take 1 tablet by mouth daily.     Rotigotine 8 MG/24HR Pt24  Place 1 patch onto the skin every evening.     spironolactone 25 MG tablet  Commonly known as:  ALDACTONE  Take 1 tablet (25 mg total) by mouth daily.     SYSTANE OP  Apply 1 drop to eye 2 (two) times daily as needed (dryness).     Vitamin D3 2000 UNITS Tabs  Take 1 tablet by mouth daily.       Allergies: Aciphex; Amantadines; Avapro; Carbidopa-levodopa; Cardura; Ciprofloxacin; Clonidine derivatives; Cozaar; Diltiazem; Famotidine; Hctz; Indapamide; Lansoprazole; Lisinopril; Loratadine; Maxidex; Mirapex; Norvasc; Pantoprazole sodium; Prilosec; Procardia; Proton pump inhibitors; Sulfa drugs cross reactors; Trimethoprim; Zyrtec; and Penicillins Social History   Social History  . Marital Status: Married    Spouse Name: N/A  . Number of Children: N/A  . Years of Education: N/A   Occupational History  . retired    Social History Main Topics  . Smoking status: Former Smoker -- 0.50 packs/day for 12 years    Types: Cigarettes    Quit date: 03/08/1964  . Smokeless tobacco: Never Used  . Alcohol Use: No  . Drug Use: No  . Sexual Activity: Not on file   Other Topics Concern  . Not on file   Social History Narrative   Quit smoking in 1967   Caffeine: Tea and chocolate    Married since 1951   Lives in a house, 1 stories, 2 persons, no pets   Current or past profession- homemaker   Exercise- Yes, Physical Therapy, Occupational Therapy   Living Will-Yes   POA/HPOA-Yes    Family History  Problem Relation Age of Onset  . Heart  disease Mother   . Heart failure Father   . Parkinsonism Father   . COPD Sister   . Cancer Brother   . Osteoporosis Daughter   . Diabetes Father   . Stroke Sister    Past Surgical History  Procedure Laterality Date  . Cardiovascular stress test  06/2007    NORMAL  . Breast lumpectomy  2010  . Cholecystectomy  1994  . Tonsillectomy    . Nasal sinus surgery  submucous resection late 1950s  . Cataract extraction  2005,2006    Dr.Devonzo    Review of Systems: Complete Systems reviewed and as per HPI, otherwise negative.  Physical Exam: Blood pressure 124/44, pulse 79, temperature 98.7 F (37.1 C), temperature source Oral, resp. rate 16, height 5\' 4"  (1.626 m), weight 53.978 kg (119 lb), SpO2 100 %.  BP 124/44 mmHg  Pulse 79  Temp(Src) 98.7 F (37.1 C) (Oral)  Resp 16  Ht 5\' 4"  (1.626 m)  Wt 53.978 kg (119 lb)  BMI 20.42 kg/m2  SpO2 100%  General Appearance:    frail appearing elderly white female. Alert, cooperative, oriented   Head:    Normocephalic, without obvious abnormality, atraumatic  Eyes:    PERRL, left lens implant noted. Sclera nonicteric   Nose:   Nares normal, septum midline, mucosa normal, no drainage    or sinus tenderness  Throat:   dry mucous membranes. No thrush noted   Neck:   Supple, symmetrical, trachea midline, no adenopathy;    thyroid:  no enlargement/tenderness/nodules; no carotid   bruit or JVD  Back:     Symmetric, no curvature, ROM normal, no CVA tenderness  Lungs:     Clear to auscultation bilaterally, respirations unlabored  Chest Wall:    No tenderness or deformity   Heart:    Regular rate and rhythm, S1 and S2 normal, no murmur, rub   or gallop  Abdomen:     Soft, non-tender, bowel sounds active, nondistended   no masses, no organomegaly  Genitalia:   deferred   Rectal:   deferred   Extremities:   Extremities normal, atraumatic, no cyanosis or edema  Pulses:   2+ and symmetric all extremities  Skin:   Skin color, texture,  turgor normal, no rashes or lesions  Lymph nodes:   Cervical, supraclavicular, and axillary nodes normal  Neurologic:   CNII-XII intact, normal strength, sensation and reflexes    throughout    Psychiatric: Calm and cooperative with normal affect  Lab results: Basic Metabolic Panel:  Recent Labs  02/12/15 1237  NA 134*  K 4.8  CL 98*  CO2 28  GLUCOSE 169*  BUN 26*  CREATININE 0.97  CALCIUM 9.0   Liver Function Tests: No results for input(s): AST, ALT, ALKPHOS, BILITOT, PROT, ALBUMIN in the last 72 hours. No results for input(s): LIPASE, AMYLASE in the last 72 hours. No results for input(s): AMMONIA in the last 72 hours. CBC:  Recent Labs  02/12/15 1237  WBC 4.5  HGB 11.0*  HCT 35.4*  MCV 95.2  PLT 128*   Cardiac Enzymes: No results for input(s): CKTOTAL, CKMB, CKMBINDEX, TROPONINI in the last 72 hours. BNP: No results for input(s): PROBNP in the last 72 hours. D-Dimer: No results for input(s): DDIMER in the last 72 hours. CBG: No results for input(s): GLUCAP in the last 72 hours. Hemoglobin A1C: No results for input(s): HGBA1C in the last 72 hours. Fasting Lipid Panel: No results for input(s): CHOL, HDL, LDLCALC, TRIG, CHOLHDL, LDLDIRECT in the last 72 hours. Thyroid Function Tests: No results for input(s): TSH, T4TOTAL, FREET4, T3FREE, THYROIDAB in the last 72 hours. Anemia Panel: No results for input(s): VITAMINB12, FOLATE, FERRITIN, TIBC, IRON, RETICCTPCT in the last 72 hours. Coagulation: No results for input(s): LABPROT, INR in the last 72 hours. Urine Drug Screen: Drugs of Abuse  No results found for: LABOPIA, COCAINSCRNUR, LABBENZ, AMPHETMU, THCU, LABBARB  Alcohol Level: No results for input(s): ETH in the last 72 hours.  Urinalysis: No results for input(s): COLORURINE, LABSPEC, PHURINE, GLUCOSEU, HGBUR, BILIRUBINUR, KETONESUR, PROTEINUR, UROBILINOGEN, NITRITE, LEUKOCYTESUR in the last 72 hours.  Invalid input(s): APPERANCEUR   Imaging results:   Dg Chest 2 View  02/12/2015  CLINICAL DATA:  Shortness of breath starting last night, weakness, chest pain EXAM: CHEST  2 VIEW COMPARISON:  12/21/2013 FINDINGS: Cardiomediastinal silhouette is stable. There is patchy in bilateral airspace disease and mild interstitial prominence suspicious for pulmonary edema. Streaky mild basilar atelectasis or infiltrate. IMPRESSION: There is patchy in bilateral airspace disease and mild interstitial prominence suspicious for pulmonary edema. Streaky mild basilar atelectasis or infiltrate. Electronically Signed   By: Lahoma Crocker M.D.   On: 02/12/2015 13:24    Assessment & Plan: Principal Problem:   Dyspnea: Likely acute on chronic diastolic heart failure. Will check BNP. Give IV Lasix twice daily for 4 doses. Monitor daily weights, ins and outs, repeat echocardiogram. Continue beta blocker at lower dose given diarrhea. Up titrate as blood pressure allows. Within the differential is pneumonia, given chest x-ray results. However, patient denies cough. She has a normal white blood cell count and is afebrile so I suspect this is less likely. With recent bout of C. difficile, would like to avoid antibiotic unless definite infection found. Might need repeat chest x-ray if clinical picture still unclear. Active Problems: Diarrhea: Was recently treated for C. difficile colitis with a course of Flagyl but developed a rash so this was stopped after 6 days. Symptoms however improved and then return. At that time, her C. difficile antigen was positive but toxin negative. Will check GI pathogen panel for clarification, but clinical suspicion for recurrence of C. difficile I. Will start oral vancomycin for now.   Parkinson's disease [facility]): Continue outpatient regimen   Hypothyroidism: Recent TSH was 9 and it appears her Synthroid dose was increased. Will continue current dose for now.   Sjogren's syndrome ([place])   Thrombocytopenia ([place]), chronic. Monitor.   Essential hypertension:  Resume metoprolol at lower dose and titrate as blood pressure allows.   Left ventricular diastolic dysfunction with preserved systolic function ([place]), see above   HLD (hyperlipidemia): It appears her statin is on hold per medication notes.   Raynaud's syndrome   Restless leg   Weakness: We'll get physical therapy evaluation. Daughter is interested in skilled nursing facility if patient qualifies.  CODE STATUS: Patient reports she would like to be full code. Family communication: Daughter and husband at bedside Disposition: Will likely need skilled nursing facility after a several day inpatient stay.  [place]ild[place] L 02/12/2015, 6:03 PM

## 2015-02-12 NOTE — Telephone Encounter (Signed)
[name], caregiver called and stated patient started with Diarrhea again last night and wants to know what she should do. [name] stated that she just had it. Please Advise.

## 2015-02-12 NOTE — ED Notes (Signed)
Attempted to weigh pt with stretcher scale prior to administering Lasik, weight not accurate.

## 2015-02-12 NOTE — ED Notes (Signed)
MD at bedside. 

## 2015-02-12 NOTE — Telephone Encounter (Signed)
Let's collect a stool sample and send it off for C diff PCR, bacterial pathogens, ova and parasites, fecal wbc.  Unfortunately, Victoria Lindsey or Victoria Lindsey will have to come get the collection cups.  Do not give imodium.  Monitor her intake.  If she is having trouble taking po, do ice chips, then clear liquids.  We will call as soon as we have results with a plan.

## 2015-02-12 NOTE — Discharge Instructions (Signed)
Heart Failure Take 40 of Lasix today and tomorrow. Resume your normal dose Friday. Call your cardiologist to schedule a recheck this week or the beginning of next week. Her to the emergency department if your symptoms are worsening. Heart failure is a condition in which the heart has trouble pumping blood. This means your heart does not pump blood efficiently for your body to work well. In some cases of heart failure, fluid may back up into your lungs or you may have swelling (edema) in your lower legs. Heart failure is usually a long-term (chronic) condition. It is important for you to take good care of yourself and follow your health care provider's treatment plan. CAUSES  Some health conditions can cause heart failure. Those health conditions include:  High blood pressure (hypertension). Hypertension causes the heart muscle to work harder than normal. When pressure in the blood vessels is high, the heart needs to pump (contract) with more force in order to circulate blood throughout the body. High blood pressure eventually causes the heart to become stiff and weak.  Coronary artery disease (CAD). CAD is the buildup of cholesterol and fat (plaque) in the arteries of the heart. The blockage in the arteries deprives the heart muscle of oxygen and blood. This can cause chest pain and may lead to a heart attack. High blood pressure can also contribute to CAD.  Heart attack (myocardial infarction). A heart attack occurs when one or more arteries in the heart become blocked. The loss of oxygen damages the muscle tissue of the heart. When this happens, part of the heart muscle dies. The injured tissue does not contract as well and weakens the heart's ability to pump blood.  Abnormal heart valves. When the heart valves do not open and close properly, it can cause heart failure. This makes the heart muscle pump harder to keep the blood flowing.  Heart muscle disease (cardiomyopathy or myocarditis). Heart  muscle disease is damage to the heart muscle from a variety of causes. These can include drug or alcohol abuse, infections, or unknown reasons. These can increase the risk of heart failure.  Lung disease. Lung disease makes the heart work harder because the lungs do not work properly. This can cause a strain on the heart, leading it to fail.  Diabetes. Diabetes increases the risk of heart failure. High blood sugar contributes to high fat (lipid) levels in the blood. Diabetes can also cause slow damage to tiny blood vessels that carry important nutrients to the heart muscle. When the heart does not get enough oxygen and food, it can cause the heart to become weak and stiff. This leads to a heart that does not contract efficiently.  Other conditions can contribute to heart failure. These include abnormal heart rhythms, thyroid problems, and low blood counts (anemia). Certain unhealthy behaviors can increase the risk of heart failure, including:  Being overweight.  Smoking or chewing tobacco.  Eating foods high in fat and cholesterol.  Abusing illicit drugs or alcohol.  Lacking physical activity. SYMPTOMS  Heart failure symptoms may vary and can be hard to detect. Symptoms may include:  Shortness of breath with activity, such as climbing stairs.  Persistent cough.  Swelling of the feet, ankles, legs, or abdomen.  Unexplained weight gain.  Difficulty breathing when lying flat (orthopnea).  Waking from sleep because of the need to sit up and get more air.  Rapid heartbeat.  Fatigue and loss of energy.  Feeling light-headed, dizzy, or close to fainting.  Loss  of appetite.  Nausea.  Increased urination during the night (nocturia). DIAGNOSIS  A diagnosis of heart failure is based on your history, symptoms, physical examination, and diagnostic tests. Diagnostic tests for heart failure may include:  Echocardiography.  Electrocardiography.  Chest X-ray.  Blood  tests.  Exercise stress test.  Cardiac angiography.  Radionuclide scans. TREATMENT  Treatment is aimed at managing the symptoms of heart failure. Medicines, behavioral changes, or surgical intervention may be necessary to treat heart failure.  Medicines to help treat heart failure may include:  Angiotensin-converting enzyme (ACE) inhibitors. This type of medicine blocks the effects of a blood protein called angiotensin-converting enzyme. ACE inhibitors relax (dilate) the blood vessels and help lower blood pressure.  Angiotensin receptor blockers (ARBs). This type of medicine blocks the actions of a blood protein called angiotensin. Angiotensin receptor blockers dilate the blood vessels and help lower blood pressure.  Water pills (diuretics). Diuretics cause the kidneys to remove salt and water from the blood. The extra fluid is removed through urination. This loss of extra fluid lowers the volume of blood the heart pumps.  Beta blockers. These prevent the heart from beating too fast and improve heart muscle strength.  Digitalis. This increases the force of the heartbeat.  Healthy behavior changes include:  Obtaining and maintaining a healthy weight.  Stopping smoking or chewing tobacco.  Eating heart-healthy foods.  Limiting or avoiding alcohol.  Stopping illicit drug use.  Physical activity as directed by your health care provider.  Surgical treatment for heart failure may include:  A procedure to open blocked arteries, repair damaged heart valves, or remove damaged heart muscle tissue.  A pacemaker to improve heart muscle function and control certain abnormal heart rhythms.  An internal cardioverter defibrillator to treat certain serious abnormal heart rhythms.  A left ventricular assist device (LVAD) to assist the pumping ability of the heart. HOME CARE INSTRUCTIONS   Take medicines only as directed by your health care provider. Medicines are important in reducing  the workload of your heart, slowing the progression of heart failure, and improving your symptoms.  Do not stop taking your medicine unless directed by your health care provider.  Do not skip any dose of medicine.  Refill your prescriptions before you run out of medicine. Your medicines are needed every day.  Engage in moderate physical activity if directed by your health care provider. Moderate physical activity can benefit some people. The elderly and people with severe heart failure should consult with a health care provider for physical activity recommendations.  Eat heart-healthy foods. Food choices should be free of trans fat and low in saturated fat, cholesterol, and salt (sodium). Healthy choices include fresh or frozen fruits and vegetables, fish, lean meats, legumes, fat-free or low-fat dairy products, and whole grain or high fiber foods. Talk to a dietitian to learn more about heart-healthy foods.  Limit sodium if directed by your health care provider. Sodium restriction may reduce symptoms of heart failure in some people. Talk to a dietitian to learn more about heart-healthy seasonings.  Use healthy cooking methods. Healthy cooking methods include roasting, grilling, broiling, baking, poaching, steaming, or stir-frying. Talk to a dietitian to learn more about healthy cooking methods.  Limit fluids if directed by your health care provider. Fluid restriction may reduce symptoms of heart failure in some people.  Weigh yourself every day. Daily weights are important in the early recognition of excess fluid. You should weigh yourself every morning after you urinate and before you eat breakfast.  Wear the same amount of clothing each time you weigh yourself. Record your daily weight. Provide your health care provider with your weight record.  Monitor and record your blood pressure if directed by your health care provider.  Check your pulse if directed by your health care provider.  Lose  weight if directed by your health care provider. Weight loss may reduce symptoms of heart failure in some people.  Stop smoking or chewing tobacco. Nicotine makes your heart work harder by causing your blood vessels to constrict. Do not use nicotine gum or patches before talking to your health care provider.  Keep all follow-up visits as directed by your health care provider. This is important.  Limit alcohol intake to no more than 1 drink per day for nonpregnant women and 2 drinks per day for men. One drink equals 12 ounces of beer, 5 ounces of wine, or 1 ounces of hard liquor. Drinking more than that is harmful to your heart. Tell your health care provider if you drink alcohol several times a week. Talk with your health care provider about whether alcohol is safe for you. If your heart has already been damaged by alcohol or you have severe heart failure, drinking alcohol should be stopped completely.  Stop illicit drug use.  Stay up-to-date with immunizations. It is especially important to prevent respiratory infections through current pneumococcal and influenza immunizations.  Manage other health conditions such as hypertension, diabetes, thyroid disease, or abnormal heart rhythms as directed by your health care provider.  Learn to manage stress.  Plan rest periods when fatigued.  Learn strategies to manage high temperatures. If the weather is extremely hot:  Avoid vigorous physical activity.  Use air conditioning or fans or seek a cooler location.  Avoid caffeine and alcohol.  Wear loose-fitting, lightweight, and light-colored clothing.  Learn strategies to manage cold temperatures. If the weather is extremely cold:  Avoid vigorous physical activity.  Layer clothes.  Wear mittens or gloves, a hat, and a scarf when going outside.  Avoid alcohol.  Obtain ongoing education and support as needed.  Participate in or seek rehabilitation as needed to maintain or improve  independence and quality of life. SEEK MEDICAL CARE IF:   You have a rapid weight gain.  You have increasing shortness of breath that is unusual for you.  You are unable to participate in your usual physical activities.  You tire easily.  You cough more than normal, especially with physical activity.  You have any or more swelling in areas such as your hands, feet, ankles, or abdomen.  You are unable to sleep because it is hard to breathe.  You feel like your heart is beating fast (palpitations).  You become dizzy or light-headed upon standing up. SEEK IMMEDIATE MEDICAL CARE IF:   You have difficulty breathing.  There is a change in mental status such as decreased alertness or difficulty with concentration.  You have a pain or discomfort in your chest.  You have an episode of fainting (syncope). MAKE SURE YOU:   Understand these instructions.  Will watch your condition.  Will get help right away if you are not doing well or get worse.   This information is not intended to replace advice given to you by your health care provider. Make sure you discuss any questions you have with your health care provider.   Document Released: 02/22/2005 Document Revised: 07/09/2014 Document Reviewed: 03/24/2012 Elsevier Interactive Patient Education [employer].

## 2015-02-12 NOTE — Telephone Encounter (Signed)
Patient went to ER>

## 2015-02-12 NOTE — ED Notes (Signed)
Pt was up for d/c, daughter at bedside states she is not comfortable taking her mother home, MD Pfeiffer aware.  Pt made decision to go home. Pt was getting dressed and ready for d/c and become very SOB, pt decided she would like to be admitted.  MD aware.  Pt back in gown and placed back on monitor.  Family at bedside.

## 2015-02-12 NOTE — ED Notes (Signed)
Pt reports having a sudden onset of shortness of breath that started last night and feeling very weak. Pt was 85% on room air at triage. 2L o2 applied. sats 92% with 2L [place].

## 2015-02-12 NOTE — ED Provider Notes (Addendum)
CSN: [id number]     Arrival date & time 02/12/15  1204 History   First MD Initiated Contact with Patient 02/12/15 1239     Chief Complaint  Patient presents with  . Shortness of Breath     (Consider location/radiation/quality/duration/timing/severity/associated sxs/prior Treatment) HPI Patient has had problems for several weeks now with recurrent diarrhea. She has at times had up to 30 episodes per day. At one point she was being treated for C. Difficile. After being on Flagyl she broke out in a significant extensive rash. She is no longer taking the Flagyl. Her daughter reports that she was toxin negative and antigen positive. She also reports that she had been on long-term Keflex for recurrent UTIs. The diarrhea had temporarily improve for about 2 days and then resumed again yesterday. Did this the patient is presenting for shortness of breath. She has describes being chronically short of breath but it seems to worsen over the past 2 days. Patient does endorse some cough. They do not endorse fever. She is getting some swelling in the legs. Now that she is on oxygen she reports feeling much better. Past Medical History  Diagnosis Date  . Hypertension   . Parkinson's disease   . Hyperlipidemia   . Dizziness   . Orthostatic hypotension   . Anemia   . Tremor   . Breast cancer ([facility])     LEFT BREAST  . Kidney failure     stage 3  . Hypothyroidism   . Prediabetes   . TIA (transient ischemic attack)   . Hyperglycemia   . Left ventricular diastolic dysfunction   . Hypophonia    Past Surgical History  Procedure Laterality Date  . Cardiovascular stress test  06/2007    NORMAL  . Breast lumpectomy  2010  . Cholecystectomy  1994  . Tonsillectomy    . Nasal sinus surgery      submucous resection late 1950s  . Cataract extraction  2005,2006    Dr.Devonzo   Family History  Problem Relation Age of Onset  . Heart disease Mother   . Heart failure Father   . Parkinsonism Father   . COPD  Sister   . Cancer Brother   . Osteoporosis Daughter   . Diabetes Father   . Stroke Sister    Social History  Substance Use Topics  . Smoking status: Former Smoker -- 0.50 packs/day for 12 years    Types: Cigarettes    Quit date: 03/08/1964  . Smokeless tobacco: Never Used  . Alcohol Use: No   OB History    No data available     Review of Systems 10 Systems reviewed and are negative for acute change except as noted in the HPI.    Allergies  Aciphex; Amantadines; Avapro; Carbidopa-levodopa; Cardura; Ciprofloxacin; Clonidine derivatives; Cozaar; Diltiazem; Famotidine; Hctz; Indapamide; Lansoprazole; Lisinopril; Loratadine; Maxidex; Mirapex; Norvasc; Pantoprazole sodium; Prilosec; Procardia; Proton pump inhibitors; Sulfa drugs cross reactors; Trimethoprim; Zyrtec; and Penicillins  Home Medications   Prior to Admission medications   Medication Sig Start Date End Date Taking? Authorizing Provider  aspirin 81 MG tablet Take 81 mg by mouth every evening.    Yes Historical Provider, MD  BIOTIN PO Take 1,000 mcg by mouth daily.    Yes Historical Provider, MD  Cholecalciferol (VITAMIN D3) 2000 UNITS TABS Take 1 tablet by mouth daily.    Yes Historical Provider, MD  clopidogrel (PLAVIX) 75 MG tablet Take 75 mg by mouth every evening.   Yes Historical Provider,  MD  Cranberry 500 MG CAPS Take 500 mg by mouth daily.    Yes Historical Provider, MD  docusate sodium (COLACE) 100 MG capsule Take 100 mg by mouth 2 (two) times daily.   Yes Historical Provider, MD  folic acid (FOLVITE) Q000111Q MCG tablet Take 800 mcg by mouth daily.    Yes Historical Provider, MD  furosemide (LASIX) 20 MG tablet TAKE 1 TABLET BY MOUTH DAILY 08/23/14  Yes [name], MD  gabapentin (NEURONTIN) 100 MG capsule Take 100 mg by mouth 3 (three) times daily as needed (pain).    Yes Historical Provider, MD  iron polysaccharides (NIFEREX) 150 MG capsule Take 150 mg by mouth every evening.   Yes Historical Provider, MD   levothyroxine (SYNTHROID, LEVOTHROID) 125 MCG tablet Take 1 tablet (125 mcg total) by mouth daily before breakfast. 01/10/15  Yes Tiffany L Reed, DO  metoprolol (LOPRESSOR) 100 MG tablet Take 1 tablet (100 mg total) by mouth 2 (two) times daily. 03/13/14  Yes [name], MD  Multiple Vitamin (MULTIVITAMIN WITH MINERALS) TABS tablet Take 1 tablet by mouth daily.   Yes Historical Provider, MD  nystatin (MYCOSTATIN) 100000 UNIT/ML suspension Take 5 mLs (500,000 Units total) by mouth 4 (four) times daily. 02/06/15  Yes Tiffany L Reed, DO  pilocarpine (SALAGEN) 5 MG tablet Take 5 mg by mouth daily as needed (showgrens- dryness).  01/25/14  Yes Historical Provider, MD  Polyethyl Glycol-Propyl Glycol (SYSTANE OP) Apply 1 drop to eye 2 (two) times daily as needed (dryness).   Yes Historical Provider, MD  Probiotic Product (PROBIOTIC PO) Take 1 tablet by mouth daily.   Yes Historical Provider, MD  rasagiline (AZILECT) 1 MG TABS Take 1 mg by mouth daily.   Yes Historical Provider, MD  Rotigotine 8 MG/24HR PT24 Place 1 patch onto the skin every evening.    Yes Historical Provider, MD  spironolactone (ALDACTONE) 25 MG tablet Take 1 tablet (25 mg total) by mouth daily. 03/13/14  Yes [name], MD  AMBULATORY NON FORMULARY MEDICATION Outpatient Speech Therapy, evaluate and treat.  DX: G20, R49.8 04/25/14   Tiffany L Reed, DO  ondansetron (ZOFRAN) 4 MG tablet Take 1 tablet (4 mg total) by mouth every 6 (six) hours. 01/25/15   Nat Christen, MD  rosuvastatin (CRESTOR) 10 MG tablet Take 1 tablet by mouth daily. 12/30/14   Historical Provider, MD   BP 155/72 mmHg  Pulse 52  Temp(Src) 98.7 F (37.1 C) (Oral)  Resp 16  Ht 5\' 4"  (1.626 m)  Wt 119 lb (53.978 kg)  BMI 20.42 kg/m2  SpO2 96% Physical Exam  Constitutional: She is oriented to person, place, and time.  Patient is well-nourished and well-developed for age. She is slender. She is nontoxic and alert. Patient does not have respiratory distress at  rest in the stretcher.  HENT:  Head: Normocephalic and atraumatic.  Eyes: EOM are normal.  Neck: Neck supple.  Cardiovascular: Normal rate, regular rhythm, normal heart sounds and intact distal pulses.   Pulmonary/Chest: Effort normal.  Some diffuse crackles. Diminished breath sounds at the bases.  Abdominal: Soft. Bowel sounds are normal. She exhibits no distension. There is no tenderness.  Musculoskeletal:  1-2+ pitting edema bilateral lower extremities, skin thinning and ecchymoses.  Neurological: She is alert and oriented to person, place, and time. Coordination normal.  Skin: Skin is warm and dry.  Psychiatric: She has a normal mood and affect.    ED Course  Procedures (including critical care time) Labs Review Labs Reviewed  BASIC METABOLIC PANEL - Abnormal; Notable for the following:    Sodium 134 (*)    Chloride 98 (*)    Glucose, Bld 169 (*)    BUN 26 (*)    GFR calc non Af Amer 51 (*)    GFR calc Af Amer 59 (*)    All other components within normal limits  CBC - Abnormal; Notable for the following:    RBC 3.72 (*)    Hemoglobin 11.0 (*)    HCT 35.4 (*)    RDW 19.7 (*)    Platelets 128 (*)    All other components within normal limits  I-STAT TROPOININ, ED    Imaging Review Dg Chest 2 View  02/12/2015  CLINICAL DATA:  Shortness of breath starting last night, weakness, chest pain EXAM: CHEST  2 VIEW COMPARISON:  12/21/2013 FINDINGS: Cardiomediastinal silhouette is stable. There is patchy in bilateral airspace disease and mild interstitial prominence suspicious for pulmonary edema. Streaky mild basilar atelectasis or infiltrate. IMPRESSION: There is patchy in bilateral airspace disease and mild interstitial prominence suspicious for pulmonary edema. Streaky mild basilar atelectasis or infiltrate. Electronically Signed   By: Lahoma Crocker M.D.   On: 02/12/2015 13:24   I have personally reviewed and evaluated these images and lab results as part of my medical  decision-making.   EKG Interpretation   Date/Time:  Wednesday February 12 2015 12:13:07 EST Ventricular Rate:  78 PR Interval:  168 QRS Duration: 62 QT Interval:  446 QTC Calculation: 508 R Axis:   -11 Text Interpretation:  Sinus rhythm with occasional Premature ventricular  complexes Possible Left atrial enlargement Left ventricular hypertrophy  with repolarization abnormality Inferior infarct , age undetermined  Abnormal ECG Confirmed by Johnney Killian, MD, Jeannie Done [id number]) on 02/12/2015 4:15:50  PM      MDM   Final diagnoses:  Acute on chronic congestive heart failure, unspecified congestive heart failure type [facility])   Patient is alert and oriented. Mild crackles on examination. Chest x-ray suggestive of CHF. At this point I do believe the patient's symptoms are secondary file CHF exacerbation. Patient was taken off oxygen and oxygen saturations remained in the mid to high 90s. He has not had chest pain. There is no elevation troponin. At this point I feel that increasing her Lasix dose for 2 days at home and then close follow-up with cardiology or primary care physician is appropriate. The patient has had chronic intermittent diarrhea. As time her renal function is good with no evidence of acute dehydration. She is not hypotensive or tachycardic. Currently her volume status is slightly overloaded. Patient will need close follow-up due to issues with episodic diarrhea and potential for volume loss.   Plan was for patient discharge. Once she was up and dressed, she felt much more dyspneic again and at that point time feeling did not feel comfortable taking her home to trial on Lasix. She will be admitted for CHF exacerbation. Charlesetta Shanks, MD 02/12/15 1617  Charlesetta Shanks, MD 02/12/15 [phone]

## 2015-02-13 ENCOUNTER — Ambulatory Visit (HOSPITAL_COMMUNITY): Payer: Medicare Other

## 2015-02-13 DIAGNOSIS — I509 Heart failure, unspecified: Secondary | ICD-10-CM

## 2015-02-13 DIAGNOSIS — R1310 Dysphagia, unspecified: Secondary | ICD-10-CM

## 2015-02-13 LAB — BASIC METABOLIC PANEL
ANION GAP: 7 (ref 5–15)
BUN: 32 mg/dL — ABNORMAL HIGH (ref 6–20)
CHLORIDE: 96 mmol/L — AB (ref 101–111)
CO2: 33 mmol/L — AB (ref 22–32)
Calcium: 8.7 mg/dL — ABNORMAL LOW (ref 8.9–10.3)
Creatinine, Ser: 1.11 mg/dL — ABNORMAL HIGH (ref 0.44–1.00)
GFR calc non Af Amer: 43 mL/min — ABNORMAL LOW (ref >60)
GFR, EST AFRICAN AMERICAN: 50 mL/min — AB (ref >60)
Glucose, Bld: 88 mg/dL (ref 65–99)
Potassium: 4.5 mmol/L (ref 3.5–5.1)
Sodium: 136 mmol/L (ref 135–145)

## 2015-02-13 LAB — CBC WITH DIFFERENTIAL/PLATELET
Basophils Absolute: 0 10*3/uL (ref 0.0–0.1)
Basophils Relative: 1 %
Eosinophils Absolute: 0.3 10*3/uL (ref 0.0–0.7)
Eosinophils Relative: 4 %
HEMATOCRIT: 32.7 % — AB (ref 36.0–46.0)
HEMOGLOBIN: 10 g/dL — AB (ref 12.0–15.0)
LYMPHS ABS: 1.4 10*3/uL (ref 0.7–4.0)
Lymphocytes Relative: 24 %
MCH: 29.7 pg (ref 26.0–34.0)
MCHC: 30.6 g/dL (ref 30.0–36.0)
MCV: 97 fL (ref 78.0–100.0)
MONOS PCT: 14 %
Monocytes Absolute: 0.8 10*3/uL (ref 0.1–1.0)
NEUTROS ABS: 3.4 10*3/uL (ref 1.7–7.7)
NEUTROS PCT: 57 %
Platelets: 127 10*3/uL — ABNORMAL LOW (ref 150–400)
RBC: 3.37 MIL/uL — ABNORMAL LOW (ref 3.87–5.11)
RDW: 19.8 % — ABNORMAL HIGH (ref 11.5–15.5)
WBC: 5.9 10*3/uL (ref 4.0–10.5)

## 2015-02-13 MED ORDER — FUROSEMIDE 40 MG PO TABS
40.0000 mg | ORAL_TABLET | Freq: Every day | ORAL | Status: DC
  Administered 2015-02-13 – 2015-02-14 (×2): 40 mg via ORAL
  Filled 2015-02-13 (×2): qty 1

## 2015-02-13 MED ORDER — FUROSEMIDE 40 MG PO TABS
40.0000 mg | ORAL_TABLET | Freq: Every day | ORAL | Status: DC
Start: 2015-02-13 — End: 2015-02-13

## 2015-02-13 MED ORDER — METOPROLOL TARTRATE 25 MG PO TABS
25.0000 mg | ORAL_TABLET | Freq: Two times a day (BID) | ORAL | Status: DC
  Administered 2015-02-13 – 2015-02-14 (×2): 25 mg via ORAL
  Filled 2015-02-13 (×2): qty 1

## 2015-02-13 NOTE — Progress Notes (Signed)
Patient being weaned from O2 per orders. She is now at 1L of O2 and at 100%

## 2015-02-13 NOTE — Evaluation (Signed)
Physical Therapy Evaluation Patient Details Name: Victoria Lindsey MRN: [id number] DOB: 01/26/1926 Today's Date: 02/13/2015   History of Present Illness  Patient is a 81 y/o female presents with SOB. Chest x-ray shows patchy infiltrates. Pt reportedly had acute pulmonary edema secondary to possibly aspiration pneumonitis. PMH includes HTN, PD, orthostatic hypotension, dizziness, tremor, anemoa, breast ca, stage 3 kidney failure.  Clinical Impression  Patient presents with generalized weakness, balance deficits (retropulsion) and deconditioning impacting safe mobility. Pt with 3 LOB during session due to retropulsion requiring Max A to prevent fall. Reports 2 falls at home in the last 6 months. Pt from home with spouse who may not be able to provide necessary assist. Would benefit from [facility] to maximize independence and mobility prior to return home.    Follow Up Recommendations SNF    Equipment Recommendations  None recommended by PT    Recommendations for Other Services OT consult     Precautions / Restrictions Precautions Precautions: Fall Restrictions Weight Bearing Restrictions: No      Mobility  Bed Mobility Overal bed mobility: Needs Assistance Bed Mobility: Supine to Sit     Supine to sit: Mod assist;HOB elevated     General bed mobility comments: Assist to elevate trunk and to scoot bottom to EOB.   Transfers Overall transfer level: Needs assistance Equipment used: Rolling walker (2 wheeled) Transfers: Sit to/from Stand Sit to Stand: Mod assist         General transfer comment: Mod A to boost from EOB with cues for hand placement and technique. Retropulsion in standing. Transferred to chair post ambulation bout. 2 LOB with retro gait when backing up to chair with no balance reactions noted.   Ambulation/Gait Ambulation/Gait assistance: Min assist Ambulation Distance (Feet): 20 Feet Assistive device: Rolling walker (2 wheeled) Gait Pattern/deviations:  Decreased stride length;Shuffle;Narrow base of support;Trunk flexed Gait velocity: decreased   General Gait Details: Slow, shuffling steps. Cues for RW management/proximity.   Stairs            Wheelchair Mobility    Modified Rankin (Stroke Patients Only)       Balance Overall balance assessment: Needs assistance Sitting-balance support: Feet supported;No upper extremity supported Sitting balance-Leahy Scale: Good     Standing balance support: During functional activity Standing balance-Leahy Scale: Poor Standing balance comment: Relient on RW and Min A for support, multiple episodes of retropulsion requiring Mod-Max A to prevent fall backwards.                             Pertinent Vitals/Pain Pain Assessment: No/denies pain    Home Living Family/patient expects to be discharged to:: Private residence Living Arrangements: Spouse/significant other   Type of Home: House Home Access: Stairs to enter Entrance Stairs-Rails: Right Entrance Stairs-Number of Steps: 2 Home Layout: One level Home Equipment: Walker - 2 wheels;Walker - 4 wheels;Shower seat;Bedside commode      Prior Function Level of Independence: Needs assistance   Gait / Transfers Assistance Needed: Uses rollator at all times.   ADL's / Homemaking Assistance Needed: husband assists as needed but recently had a surgery~ 2 weeks ago per pt and not able to help her much right now.  Comments: husband drives. 2 falls in last 6 months.     Hand Dominance   Dominant Hand: Right    Extremity/Trunk Assessment   Upper Extremity Assessment: Defer to OT evaluation  Lower Extremity Assessment: Generalized weakness         Communication   Communication: No difficulties  Cognition Arousal/Alertness: Awake/alert Behavior During Therapy: WFL for tasks assessed/performed Overall Cognitive Status: Within Functional Limits for tasks assessed                      General  Comments      Exercises General Exercises - Lower Extremity Ankle Circles/Pumps: Both;10 reps;Supine Long Arc Quad: Both;10 reps;Seated      Assessment/Plan    PT Assessment Patient needs continued PT services  PT Diagnosis Difficulty walking;Generalized weakness   PT Problem List Decreased strength;Decreased activity tolerance;Decreased balance;Decreased mobility;Decreased safety awareness  PT Treatment Interventions Balance training;Gait training;Functional mobility training;Therapeutic activities;Therapeutic exercise;Patient/family education;Stair training   PT Goals (Current goals can be found in the Care Plan section) Acute Rehab PT Goals Patient Stated Goal: to get stronger PT Goal Formulation: With patient Time For Goal Achievement: 02/27/15 Potential to Achieve Goals: Good    Frequency Min 2X/week   Barriers to discharge Decreased caregiver support;Inaccessible home environment Spouse may not be able to provide proper care due to recent surgery. Steps to climb to enter home    Co-evaluation               End of Session Equipment Utilized During Treatment: Gait belt Activity Tolerance: Patient tolerated treatment well Patient left: in chair;with call bell/phone within reach Nurse Communication: Mobility status         Time: 0755-0820 PT Time Calculation (min) (ACUTE ONLY): 25 min   Charges:   PT Evaluation $Initial PT Evaluation Tier I: 1 Procedure PT Treatments $Gait Training: 8-22 mins   PT G Codes:        Yvan Dority A Kelcee Bjorn 02/13/2015, 8:45 AM [name], [place], DPT [phone]

## 2015-02-13 NOTE — Progress Notes (Signed)
[facility] PROGRESS NOTE  Victoria Lindsey [id number] DOB: [birth year] DOA: 02/12/2015 PCP: Hollace Kinnier, DO  Assessment/Plan:  Principal Problem:   Acute heart failure, likely diastolic: Confirmed by BNP above 1000. Dyspnea improved. Will change Lasix to 40 mg by mouth daily. Awaiting repeat echocardiogram. Active Problems:   Diarrhea: Treating as presumed C. difficile colitis. See H&P for details. GI pathogen panel pending.   Parkinson's disease ([place])   Hypothyroidism: Continue Synthroid and repeat TSH in a few months.   Sjogren's syndrome ([place])   Thrombocytopenia ([place])   Essential hypertension: Increase metoprolol to 25 mg by mouth twice a day. Titrate as blood pressure allows.   Left ventricular diastolic dysfunction with preserved systolic function (HCC)   HLD (hyperlipidemia)   Raynaud's syndrome   Restless leg Dysphagia: Noted to be coughing after drinking liquids. According to family members, has been on thickened liquids in the past. Will consult speech therapy for evaluation. Deconditioning: Physical therapy recommending skilled nursing facility. Social work consulted. Should be stable for transfer in 1-2 days.   Code Status:  full Family Communication:  Multiple at bedside Disposition Plan:  SNF 1-2 days  HPI/Subjective: No dyspnea. Per daughter, had 2-3 loose stools today. Appetite good. No nausea or vomiting.  Objective: Filed Vitals:   02/12/15 2026 02/13/15 0538  BP: 150/58 126/41  Pulse: 78 72  Temp: 97.9 F (36.6 C) 97.6 F (36.4 C)  Resp: 18 18    Intake/Output Summary (Last 24 hours) at 02/13/15 1302 Last data filed at 02/13/15 0848  Gross per 24 hour  Intake    240 ml  Output      0 ml  Net    240 ml   Filed Weights   02/12/15 1214 02/12/15 2026 02/13/15 0538  Weight: 53.978 kg (119 lb) 52.3 kg (115 lb 4.8 oz) 51.3 kg (113 lb 1.5 oz)    Exam:   General:  In chair. Alert. Noted to be coughing after drinking iced  tea.  Cardiovascular: Regular rate rhythm without murmurs gallops rubs  Respiratory: Clear to auscultation bilaterally without wheezes rhonchi or rales  Abdomen: Soft nontender nondistended  Ext: No clubbing cyanosis or edema  Basic Metabolic Panel:  Recent Labs Lab 02/12/15 1237 02/13/15 0418  NA 134* 136  K 4.8 4.5  CL 98* 96*  CO2 28 33*  GLUCOSE 169* 88  BUN 26* 32*  CREATININE 0.97 1.11*  CALCIUM 9.0 8.7*   Liver Function Tests: No results for input(s): AST, ALT, ALKPHOS, BILITOT, PROT, ALBUMIN in the last 168 hours. No results for input(s): LIPASE, AMYLASE in the last 168 hours. No results for input(s): AMMONIA in the last 168 hours. CBC:  Recent Labs Lab 02/12/15 1237 02/13/15 0418  WBC 4.5 5.9  NEUTROABS  --  3.4  HGB 11.0* 10.0*  HCT 35.4* 32.7*  MCV 95.2 97.0  PLT 128* 127*   Cardiac Enzymes: No results for input(s): CKTOTAL, CKMB, CKMBINDEX, TROPONINI in the last 168 hours. BNP (last 3 results)  Recent Labs  02/12/15 1237  BNP 1814.9*    ProBNP (last 3 results) No results for input(s): PROBNP in the last 8760 hours.  CBG: No results for input(s): GLUCAP in the last 168 hours.  No results found for this or any previous visit (from the past 240 hour(s)).   Studies: Dg Chest 2 View  02/12/2015  CLINICAL DATA:  Shortness of breath starting last night, weakness, chest pain EXAM: CHEST  2 VIEW COMPARISON:  12/21/2013 FINDINGS: Cardiomediastinal silhouette  is stable. There is patchy in bilateral airspace disease and mild interstitial prominence suspicious for pulmonary edema. Streaky mild basilar atelectasis or infiltrate. IMPRESSION: There is patchy in bilateral airspace disease and mild interstitial prominence suspicious for pulmonary edema. Streaky mild basilar atelectasis or infiltrate. Electronically Signed   By: Lahoma Crocker M.D.   On: 02/12/2015 13:24    Scheduled Meds: . aspirin EC  81 mg Oral QPM  . cholecalciferol  1,000 Units Oral Daily   . clopidogrel  75 mg Oral QPM  . folic acid  1 mg Oral Daily  . furosemide  40 mg Intravenous BID  . heparin  5,000 Units Subcutaneous 3 times per day  . iron polysaccharides  150 mg Oral QPM  . levothyroxine  125 mcg Oral QAC breakfast  . metoprolol  25 mg Oral BID  . multivitamin with minerals  1 tablet Oral Daily  . rasagiline  1 mg Oral Daily  . rotigotine  2 patch Transdermal QPM  . sodium chloride  3 mL Intravenous Q12H  . spironolactone  25 mg Oral Daily  . vancomycin  125 mg Oral 4 times per day   Continuous Infusions:   Time spent: 35 minutes  [facility] [email or web address], password [facility] 02/13/2015, 1:02 PM  LOS: 1 day

## 2015-02-13 NOTE — Progress Notes (Signed)
  Echocardiogram 2D Echocardiogram has been performed.  Victoria Lindsey 02/13/2015, 4:24 PM

## 2015-02-13 NOTE — NC FL2 (Signed)
MEDICAID FL2 LEVEL OF CARE SCREENING TOOL     IDENTIFICATION  Patient Name: Victoria Lindsey Birthdate: 1926/02/07 Sex: female Admission Date (Current Location): 02/12/2015  [facility] and [place] Number: Herbalist and Address:  [facility] [place]. [facility], [place] [address], [place], [place] [address]      Provider Number: [id number]  Attending Physician Name and Address:  Delfina Redwood, MD  Relative Name and Phone Number:       Current Level of Care: Hospital Recommended Level of Care: [place] Prior Approval Number:    Date Approved/Denied:   PASRR Number: [id number]  Discharge Plan: SNF    Current Diagnoses: Patient Active Problem List   Diagnosis Date Noted  . Diarrhea 02/12/2015  . Onychomycosis 01/17/2015  . Folliculitis 123456  . Encephalopathy, hypertensive 05/29/2014  . Dizziness of unknown cause 02/21/2014  . Left ventricular diastolic dysfunction with preserved systolic function ([place]) 99991111  . Essential hypertension 12/30/2013  . Transaminitis 12/24/2013  . Palpitations 12/23/2013  . Respiratory insufficiency 12/22/2013  . Normocytic anemia 12/21/2013  . Thrombocytopenia ([place]) 12/21/2013  . Abnormal ECG 12/15/2013  . Abnormal involuntary movement 12/15/2013  . Colorectal polyps 12/15/2013  . Chest pain 12/15/2013  . Arteriosclerosis of coronary artery 12/15/2013  . Chronic kidney disease (CKD), stage III (moderate) 12/15/2013  . [place] 12/15/2013  . Diabetes ([place]) 12/15/2013  . Breathing difficult 12/15/2013  . Acid reflux 12/15/2013  . HLD (hyperlipidemia) 12/15/2013  . BP (high blood pressure) 12/15/2013  . FOM (frequency of micturition) 12/15/2013  . Breast cancer, female ([place]) 12/15/2013  . Fungal infection of nail 12/15/2013  . Osteopenia 12/15/2013  . Raynaud's syndrome 12/15/2013  . Restless leg 12/15/2013  . Gougerout-Sjoegren syndrome ([place]) 12/15/2013  . Change in blood platelet  count 12/15/2013  . Infection of urinary tract 12/15/2013  . Lower urinary tract infection 10/12/2013  . Idiopathic Parkinson's disease ([place]) 09/06/2013  . Bladder spasm 09/05/2013  . Urgency of micturation 09/05/2013  . Dyspnea 06/15/2012  . Hyperglycemia 12/27/2011  . Sjogren's syndrome ([place]) 12/17/2010  . TIA (transient ischemic attack) 12/17/2010  . Benign hypertensive heart disease without heart failure 09/01/2010  . Parkinson's disease ([place]) 09/01/2010  . Hypothyroidism 09/01/2010  . Hypercholesterolemia 09/01/2010  . Chronic anemia 09/01/2010    Orientation ACTIVITIES/SOCIAL BLADDER RESPIRATION    Self, Time, Situation, Place    Incontinent O2 (As needed) (2L [place])  BEHAVIORAL SYMPTOMS/MOOD NEUROLOGICAL BOWEL NUTRITION STATUS      Continent Diet (see DC summary)  PHYSICIAN VISITS COMMUNICATION OF NEEDS Height & Weight Skin  Over 180 days Verbally 5\' 4"  (162.6 cm) 113 lbs. Normal          AMBULATORY STATUS RESPIRATION    Assist extensive O2 (As needed) (2L [place])      Personal Care Assistance Level of Assistance  Bathing, Feeding, Dressing Bathing Assistance: Maximum assistance Feeding assistance: Limited assistance Dressing Assistance: Maximum assistance      Functional Limitations Info                SPECIAL CARE FACTORS FREQUENCY  PT (By licensed PT)     PT Frequency: 5/wk             Additional Factors Info  Code Status, Allergies, Isolation Precautions Code Status Info: FULL Allergies Info: aciphex, amantadines, avapro, carbidopa-levodopa, cardura, ciprofloxacin, clondine derivatives, cozaar, dilitiazam, famotidine, hctz, indapamide, lansoprazole sodium, priolaz,  procardia, proton pump inhibitors, sulfa drugs cross reactors, trimethroprim, zyrtec, penicillins  Isolation Precautions Info: Enteric     Current Medications (02/13/2015):  This is the current hospital active medication list Current Facility-Administered Medications  Medication  Dose Route Frequency Provider Last Rate Last Dose  . 0.9 %  sodium chloride infusion  250 mL Intravenous PRN Delfina Redwood, MD      . acetaminophen (TYLENOL) tablet 650 mg  650 mg Oral Q4H PRN Delfina Redwood, MD      . aspirin EC tablet 81 mg  81 mg Oral QPM Delfina Redwood, MD   81 mg at 02/12/15 2357  . cholecalciferol (VITAMIN D) tablet 1,000 Units  1,000 Units Oral Daily Skeet Simmer, RPH   1,000 Units at 02/13/15 1039  . clopidogrel (PLAVIX) tablet 75 mg  75 mg Oral QPM Delfina Redwood, MD   75 mg at 02/12/15 2356  . folic acid (FOLVITE) tablet 1 mg  1 mg Oral Daily Skeet Simmer, RPH   1 mg at 02/13/15 1041  . furosemide (LASIX) injection 40 mg  40 mg Intravenous BID Delfina Redwood, MD   40 mg at 02/13/15 0830  . gabapentin (NEURONTIN) capsule 100 mg  100 mg Oral TID PRN Delfina Redwood, MD      . heparin injection 5,000 Units  5,000 Units Subcutaneous 3 times per day Delfina Redwood, MD   5,000 Units at 02/13/15 [phone]  . iron polysaccharides (NIFEREX) capsule 150 mg  150 mg Oral QPM Delfina Redwood, MD   150 mg at 02/12/15 2356  . levothyroxine (SYNTHROID, LEVOTHROID) tablet 125 mcg  125 mcg Oral QAC breakfast Delfina Redwood, MD   125 mcg at 02/13/15 [phone]  . metoprolol tartrate (LOPRESSOR) tablet 12.5 mg  12.5 mg Oral BID Delfina Redwood, MD   12.5 mg at 02/12/15 2356  . multivitamin with minerals tablet 1 tablet  1 tablet Oral Daily Delfina Redwood, MD   1 tablet at 02/13/15 1039  . ondansetron (ZOFRAN) injection 4 mg  4 mg Intravenous Q6H PRN Delfina Redwood, MD      . pilocarpine (SALAGEN) tablet 5 mg  5 mg Oral Daily PRN Delfina Redwood, MD      . polyvinyl alcohol (LIQUIFILM TEARS) 1.4 % ophthalmic solution 1 drop  1 drop Both Eyes BID PRN Skeet Simmer, RPH      . rasagiline (AZILECT) tablet 1 mg  1 mg Oral Daily Delfina Redwood, MD   1 mg at 02/13/15 1037  . rotigotine (NEUPRO) 4 MG/24HR 2 patch  2 patch Transdermal QPM Skeet Simmer,  [facility]   2 patch at 02/12/15 2358  . sodium chloride 0.9 % injection 3 mL  3 mL Intravenous Q12H Delfina Redwood, MD   3 mL at 02/13/15 1042  . sodium chloride 0.9 % injection 3 mL  3 mL Intravenous PRN Delfina Redwood, MD      . spironolactone (ALDACTONE) tablet 25 mg  25 mg Oral Daily Delfina Redwood, MD   25 mg at 02/13/15 1039  . vancomycin (VANCOCIN) 50 mg/mL oral solution 125 mg  125 mg Oral 4 times per day Delfina Redwood, MD         Discharge Medications: Please see discharge summary for a list of discharge medications.  Relevant Imaging Results:  Relevant Lab Results:  Recent Labs    Additional Information SS#: [id number]  Cranford Mon, LCSW

## 2015-02-13 NOTE — Progress Notes (Signed)
UR Completed. Delainy Mcelhiney, RN, BSN.  [phone] 

## 2015-02-13 NOTE — Clinical Social Work Placement (Signed)
   CLINICAL SOCIAL WORK PLACEMENT  NOTE  Date:  02/13/2015  Patient Details  Name: Victoria Lindsey MRN: [id number] Date of Birth: 07/30/1925  Clinical Social Work is seeking post-discharge placement for this patient at the [place] level of care (*CSW will initial, date and re-position this form in  chart as items are completed):  Yes   Patient/family provided with [facility] Work Department's list of facilities offering this level of care within the geographic area requested by the patient (or if unable, by the patient's family).  Yes   Patient/family informed of their freedom to choose among providers that offer the needed level of care, that participate in Medicare, Medicaid or managed care program needed by the patient, have an available bed and are willing to accept the patient.  Yes   Patient/family informed of [facility]'s ownership interest in [facility] and [facility], as well as of the fact that they are under no obligation to receive care at these facilities.  PASRR submitted to EDS on       PASRR number received on       Existing PASRR number confirmed on 02/13/15     FL2 transmitted to all facilities in geographic area requested by pt/family on 02/13/15     FL2 transmitted to all facilities within larger geographic area on       Patient informed that his/her managed care company has contracts with or will negotiate with certain facilities, including the following:            Patient/family informed of bed offers received.  Patient chooses bed at       Physician recommends and patient chooses bed at      Patient to be transferred to   on  .  Patient to be transferred to facility by       Patient family notified on   of transfer.  Name of family member notified:        PHYSICIAN Please sign FL2     Additional Comment:    _______________________________________________ Cranford Mon, LCSW 02/13/2015, 12:49  PM

## 2015-02-13 NOTE — Clinical Social Work Note (Signed)
Clinical Social Work Assessment  Patient Details  Name: Victoria Lindsey MRN: [id number] Date of Birth: [birth year]  Date of referral:  02/13/15               Reason for consult:  Facility Placement                Permission sought to share information with:  Facility Sport and exercise psychologist, Family Supports Permission granted to share information::  Yes, Verbal Permission Granted  Name::     Mason Jim  Agency::  [facility]  Relationship::  spouse, daughter  Sport and exercise psychologist Information:     Housing/Transportation Living arrangements for the past 2 months:  Single Family Home Source of Information:  Patient, Adult Children, Spouse Patient Interpreter Needed:  None Criminal Activity/Legal Involvement Pertinent to Current Situation/Hospitalization:  No - Comment as needed Significant Relationships:  Adult Children, Spouse Lives with:  Spouse Do you feel safe going back to the place where you live?  No Need for family participation in patient care:  Yes (Comment) (some physical aid as well as help with decision making)  Care giving concerns:pt lives at home with husband who is elderly and unable to provide sufficient assistance   Social Worker assessment / plan:  CSW spoke with pt, spouse, and dtr at bedside concerning PT recommendation for SNF.  Employment status:  Retired Nurse, adult PT Recommendations:  [place] / Referral to community resources:  [place]  Patient/Family's Response to care:  Family agreeable to SNF and state pt has been to [place] in the past- would like to consider [facility] this time since it is closer to home.  Patient/Family's Understanding of and Emotional Response to Diagnosis, Current Treatment, and Prognosis:  Pt hopeful that she will only need a week or two of rehab before returning home  Emotional Assessment Appearance:  Appears stated age Attitude/Demeanor/Rapport:    Affect  (typically observed):  Appropriate, Quiet Orientation:  Oriented to Situation, Oriented to  Time, Oriented to Place, Oriented to Self Alcohol / Substance use:  Not Applicable Psych involvement (Current and /or in the community):  No (Comment)  Discharge Needs  Concerns to be addressed:  Care Coordination Readmission within the last 30 days:  No Current discharge risk:  Physical Impairment Barriers to Discharge:  Continued Medical Work up   [employer], LCSW 02/13/2015, 12:46 PM

## 2015-02-13 NOTE — Evaluation (Signed)
Clinical/Bedside Swallow Evaluation Patient Details  Name: Victoria Lindsey MRN: [id number] Date of Birth: [birth year]  Today's Date: 02/13/2015 Time: SLP Start Time (ACUTE ONLY): [id number] SLP Stop Time (ACUTE ONLY): 1537 SLP Time Calculation (min) (ACUTE ONLY): 19 min  Past Medical History:  Past Medical History  Diagnosis Date  . Hypertension   . Parkinson's disease   . Hyperlipidemia   . Dizziness   . Orthostatic hypotension   . Anemia   . Tremor   . Breast cancer ([place])     LEFT BREAST  . Kidney failure     stage 3  . Hypothyroidism   . Prediabetes   . TIA (transient ischemic attack)   . Hyperglycemia   . Left ventricular diastolic dysfunction   . Hypophonia    Past Surgical History:  Past Surgical History  Procedure Laterality Date  . Cardiovascular stress test  06/2007    NORMAL  . Breast lumpectomy  2010  . Cholecystectomy  1994  . Tonsillectomy    . Nasal sinus surgery      submucous resection late 1950s  . Cataract extraction  2005,2006    Dr.Devonzo   HPI:  Patient is a 81 y/o female presents with SOB. Chest x-ray shows patchy infiltrates. Pt reportedly had acute pulmonary edema secondary to possibly aspiration pneumonitis. PMH includes HTN, PD, orthostatic hypotension, dizziness, tremor, anemoa, breast ca, stage 3 kidney failure. Previous h/o dysphagia with MBS in 12/27/13 recommending regular diet and thin liquids with Max cues needed for chin tuck versus Dys 3 diet and nectar thick liquids without additional compensatory strategies. At that time, pt and family had opted for the softer diet and thickened liquids.   Assessment / Plan / Recommendation Clinical Impression  Pt has immediate and delayed coughing with thin liquids by cup, although this is not observed when drinking through a straw. Pt subjectively believes that her swallowing function has worsened since she has been in the hospital, and that she coughs on her drinks at every meal. Most recent MBS was in  12/2013, at which time pt was observed to have airway compromise with thin liquids. Given the above as well as dyspnea that prompted her hospital stay, recommend a more conservative approach including nectar thick liquids. Will adjust diet to mechanical soft given slow but overall effective oral phase with solid POs.     Aspiration Risk  Mild aspiration risk    Diet Recommendation  Dys 3 (mechanical soft) and nectar thick liquids   Medication Administration: Whole meds with puree    Other  Recommendations Oral Care Recommendations: Oral care BID Other Recommendations: Order thickener from pharmacy;Prohibited food (jello, ice cream, thin soups);Remove water pitcher   Follow up Recommendations  Skilled Nursing facility    Frequency and Duration min 2x/week  2 weeks       Prognosis Prognosis for Safe Diet Advancement: Fair Barriers to Reach Goals: Other (Comment) (PLOF)      Swallow Study   General Date of Onset: 02/12/15 HPI: Patient is a 81 y/o female presents with SOB. Chest x-ray shows patchy infiltrates. Pt reportedly had acute pulmonary edema secondary to possibly aspiration pneumonitis. PMH includes HTN, PD, orthostatic hypotension, dizziness, tremor, anemoa, breast ca, stage 3 kidney failure. Previous h/o dysphagia with MBS in 12/27/13 recommending regular diet and thin liquids with Max cues needed for chin tuck versus Dys 3 diet and nectar thick liquids without additional compensatory strategies. At that time, pt and family had opted for the softer diet and  thickened liquids. Type of Study: Bedside Swallow Evaluation Previous Swallow Assessment: see HPI Diet Prior to this Study: Regular;Thin liquids Temperature Spikes Noted: No Respiratory Status: Nasal cannula History of Recent Intubation: No Behavior/Cognition: Alert;Cooperative;Pleasant mood Oral Cavity Assessment: Within Functional Limits Oral Care Completed by SLP: No Oral Cavity - Dentition: Adequate natural  dentition Vision: Functional for self-feeding Self-Feeding Abilities: Able to feed self Patient Positioning: Upright in chair Baseline Vocal Quality: Low vocal intensity    Oral/Motor/Sensory Function     Ice Chips Ice chips: Not tested   Thin Liquid Thin Liquid: Impaired Presentation: Cup;Self Fed;Straw Pharyngeal  Phase Impairments: Suspected delayed Swallow;Cough - Immediate;Throat Clearing - Delayed    Nectar Thick Nectar Thick Liquid: Not tested   Honey Thick Honey Thick Liquid: Not tested   Puree Puree: Within functional limits Presentation: Self Fed;Spoon   Solid Solid: Impaired Presentation: Self Fed Oral Phase Functional Implications: Prolonged oral transit Pharyngeal Phase Impairments: Throat Clearing - Immediate      Germain Osgood, M.A. CCC-SLP [phone]  Germain Osgood 02/13/2015,3:48 PM

## 2015-02-14 ENCOUNTER — Encounter: Payer: Self-pay | Admitting: Internal Medicine

## 2015-02-14 DIAGNOSIS — I110 Hypertensive heart disease with heart failure: Secondary | ICD-10-CM | POA: Diagnosis not present

## 2015-02-14 DIAGNOSIS — E162 Hypoglycemia, unspecified: Secondary | ICD-10-CM | POA: Diagnosis not present

## 2015-02-14 DIAGNOSIS — E038 Other specified hypothyroidism: Secondary | ICD-10-CM | POA: Diagnosis not present

## 2015-02-14 DIAGNOSIS — R0789 Other chest pain: Secondary | ICD-10-CM | POA: Diagnosis not present

## 2015-02-14 DIAGNOSIS — I129 Hypertensive chronic kidney disease with stage 1 through stage 4 chronic kidney disease, or unspecified chronic kidney disease: Secondary | ICD-10-CM | POA: Diagnosis not present

## 2015-02-14 DIAGNOSIS — N189 Chronic kidney disease, unspecified: Secondary | ICD-10-CM | POA: Diagnosis not present

## 2015-02-14 DIAGNOSIS — R2681 Unsteadiness on feet: Secondary | ICD-10-CM | POA: Diagnosis not present

## 2015-02-14 DIAGNOSIS — I5033 Acute on chronic diastolic (congestive) heart failure: Secondary | ICD-10-CM | POA: Diagnosis not present

## 2015-02-14 DIAGNOSIS — A09 Infectious gastroenteritis and colitis, unspecified: Secondary | ICD-10-CM | POA: Diagnosis not present

## 2015-02-14 DIAGNOSIS — R488 Other symbolic dysfunctions: Secondary | ICD-10-CM | POA: Diagnosis not present

## 2015-02-14 DIAGNOSIS — G20 Parkinson's disease: Secondary | ICD-10-CM | POA: Diagnosis not present

## 2015-02-14 DIAGNOSIS — Z9181 History of falling: Secondary | ICD-10-CM | POA: Diagnosis not present

## 2015-02-14 DIAGNOSIS — R1312 Dysphagia, oropharyngeal phase: Secondary | ICD-10-CM | POA: Diagnosis not present

## 2015-02-14 DIAGNOSIS — M6281 Muscle weakness (generalized): Secondary | ICD-10-CM | POA: Diagnosis not present

## 2015-02-14 DIAGNOSIS — B001 Herpesviral vesicular dermatitis: Secondary | ICD-10-CM | POA: Diagnosis not present

## 2015-02-14 DIAGNOSIS — R0600 Dyspnea, unspecified: Secondary | ICD-10-CM | POA: Diagnosis not present

## 2015-02-14 DIAGNOSIS — M3500 Sicca syndrome, unspecified: Secondary | ICD-10-CM | POA: Diagnosis not present

## 2015-02-14 LAB — BASIC METABOLIC PANEL
Anion gap: 4 — ABNORMAL LOW (ref 5–15)
BUN: 30 mg/dL — ABNORMAL HIGH (ref 6–20)
CALCIUM: 8.5 mg/dL — AB (ref 8.9–10.3)
CO2: 35 mmol/L — ABNORMAL HIGH (ref 22–32)
CREATININE: 0.95 mg/dL (ref 0.44–1.00)
Chloride: 97 mmol/L — ABNORMAL LOW (ref 101–111)
GFR, EST NON AFRICAN AMERICAN: 52 mL/min — AB (ref >60)
Glucose, Bld: 112 mg/dL — ABNORMAL HIGH (ref 65–99)
Potassium: 4.1 mmol/L (ref 3.5–5.1)
SODIUM: 136 mmol/L (ref 135–145)

## 2015-02-14 MED ORDER — FUROSEMIDE 40 MG PO TABS: 40.0000 mg | ORAL_TABLET | Freq: Every day | ORAL | Status: DC

## 2015-02-14 MED ORDER — VANCOMYCIN 50 MG/ML ORAL SOLUTION: 125.0000 mg | Freq: Four times a day (QID) | ORAL | Status: DC

## 2015-02-14 MED ORDER — ONDANSETRON HCL 4 MG PO TABS: 4.0000 mg | ORAL_TABLET | Freq: Three times a day (TID) | ORAL | Status: DC | PRN

## 2015-02-14 NOTE — Discharge Summary (Signed)
Physician Discharge Summary  Victoria Lindsey [id number] DOB: 26-Oct-1925 DOA: 02/12/2015  PCP: Hollace Kinnier, DO  Admit date: 02/12/2015 Discharge date: 02/14/2015  Time spent: Greater than 30 minutes  Recommendations for Outpatient Follow-up:  To skilled nursing facility Monitor BMET periodically Monitor daily weights Check TSH in 2 months GI pathogen panel pending at the time of discharge  Discharge Diagnoses:  Principal Problem:   Acute on chronic diastolic (congestive) heart failure (HCC) Active Problems:   Parkinson's disease ([place])   Hypothyroidism   Sjogren's syndrome ([place])   Thrombocytopenia ([place])   Essential hypertension   HLD (hyperlipidemia)   Raynaud's syndrome   Restless leg   Diarrhea   Dysphagia   Discharge Condition: Stable  Diet recommendation: Heart healthy  CODE STATUS: Patient wishes to be full code.  Filed Weights   02/12/15 2026 02/13/15 0538 02/14/15 0450  Weight: 52.3 kg (115 lb 4.8 oz) 51.3 kg (113 lb 1.5 oz) 48.671 kg (107 lb 4.8 oz)    History of present illness:  81 y.o. female with history of diastolic dysfunction on echocardiogram, but no known history of heart failure, per chart review, recently treated for C. difficile colitis (C. difficile antigen positive, toxin negative), with recent allergic reaction to Flagyl, hypertension, Parkinson's disease, Sjogren's syndrome and Raynaud's disease who presents with shortness of breath. Patient's daughter reports that her weight increased by 2 pounds during her last doctor's visit. She's has seemed more short of breath for the past few days. No cough, fevers chills. She received about 6 days of Flagyl for C. difficile infection, but this was stopped after she developed an allergic reaction consisting of severe rash and throat itching. Her diarrhea resolved and then returned again last night. She denies abdominal pain. She has had nausea but no vomiting. Her appetite has been good. In the emergency  room, her white blood cell count is normal. She is afebrile. Chest x-ray shows patchy infiltrates. BNP not done. Echocardiogram done in [id number] shows diastolic dysfunction and normal ejection fraction. She reportedly had acute pulmonary edema secondary to possibly aspiration pneumonitis, but could not rule out diastolic heart failure per chart review. Patient has been very weak. She was to be discharged home with increased home Lasix dose but while getting up to get ready, she became very short of breath and admission was recommended. Her oxygen saturations are normal. She reportedly had rales on exam and got a dose of Lasix. Her daughter feels that due to weakness, patient might need placement. She was admitted to [facility] last year.  Hospital Course:  Patient admitted to telemetry. Acute heart failure confirmed by BNP above the thousand. Diuresed well with IV Lasix. Dyspnea resolved. Patient had been on Lasix 20 mg daily. Will change to 40 mg daily. Will need monitoring of weights, basic metabolic panel, symptoms.  Echocardiogram unchanged from last year. Normal ejection fraction. Grade 1 diastolic dysfunction. Admission weight 119 pounds. Discharge weight 107 pounds.  Patient was started empirically on oral vancomycin, given her recent history of C. difficile colitis. GI pathogen panel collected and at this time is pending. Diarrhea has improved. Will treat with a 10 day course, however may stop vancomycin if GI pathogen panel comes back negative.  Patient is to work with physical therapy and skilled nursing facility is recommended. Patient is stable for discharge today.   Procedures:  None  Consultations:  None  Discharge Exam: Filed Vitals:   02/14/15 0450 02/14/15 1030  BP: 146/47 128/52  Pulse: 77 89  Temp: 97.7 F (36.5 C) 97.6 F (36.4 C)  Resp: 18 18    General: Comfortable. Breathing nonlabored. Eating lunch. Oriented. Cardiovascular: Regular rate  rhythm without murmurs gallops rubs Respiratory: Clear to auscultation bilaterally without wheezes rhonchi or rales Abdomen: Soft nontender nondistended. Extremities: No clubbing cyanosis or edema  Discharge Instructions   Discharge Instructions    Diet - low sodium heart healthy    Complete by:  As directed      Walk with assistance    Complete by:  As directed           Current Discharge Medication List    START taking these medications   Details  vancomycin (VANCOCIN) 50 mg/mL oral solution Take 2.5 mLs (125 mg total) by mouth every 6 (six) hours. Through 02/21/15, unless GI pathogen panel negative, then may stop vancomycin.      CONTINUE these medications which have CHANGED   Details  furosemide (LASIX) 40 MG tablet Take 1 tablet (40 mg total) by mouth daily. Qty: 30 tablet    ondansetron (ZOFRAN) 4 MG tablet Take 1 tablet (4 mg total) by mouth every 8 (eight) hours as needed for nausea or vomiting. Qty: 10 tablet, Refills: 0      CONTINUE these medications which have NOT CHANGED   Details  aspirin 81 MG tablet Take 81 mg by mouth every evening.     BIOTIN PO Take 1,000 mcg by mouth daily.     Cholecalciferol (VITAMIN D3) 2000 UNITS TABS Take 1 tablet by mouth daily.     clopidogrel (PLAVIX) 75 MG tablet Take 75 mg by mouth every evening.    Cranberry 500 MG CAPS Take 500 mg by mouth daily.     folic acid (FOLVITE) Q000111Q MCG tablet Take 800 mcg by mouth daily.     gabapentin (NEURONTIN) 100 MG capsule Take 100 mg by mouth 3 (three) times daily as needed (pain).     iron polysaccharides (NIFEREX) 150 MG capsule Take 150 mg by mouth every evening.    levothyroxine (SYNTHROID, LEVOTHROID) 125 MCG tablet Take 1 tablet (125 mcg total) by mouth daily before breakfast. Qty: 30 tablet, Refills: 3   Associated Diagnoses: Other specified hypothyroidism    metoprolol (LOPRESSOR) 100 MG tablet Take 1 tablet (100 mg total) by mouth 2 (two) times daily. Qty: 180 tablet,  Refills: 3    Multiple Vitamin (MULTIVITAMIN WITH MINERALS) TABS tablet Take 1 tablet by mouth daily.    pilocarpine (SALAGEN) 5 MG tablet Take 5 mg by mouth daily as needed (showgrens- dryness).  Refills: 0    Polyethyl Glycol-Propyl Glycol (SYSTANE OP) Apply 1 drop to eye 2 (two) times daily as needed (dryness).    Probiotic Product (PROBIOTIC PO) Take 1 tablet by mouth daily.    rasagiline (AZILECT) 1 MG TABS Take 1 mg by mouth daily.    Rotigotine 8 MG/24HR PT24 Place 1 patch onto the skin every evening.     spironolactone (ALDACTONE) 25 MG tablet Take 1 tablet (25 mg total) by mouth daily. Qty: 90 tablet, Refills: 3    AMBULATORY NON FORMULARY MEDICATION Outpatient Speech Therapy, evaluate and treat.  DX: G20, R49.8 Qty: 1 each, Refills: 0    rosuvastatin (CRESTOR) 10 MG tablet Take 1 tablet by mouth daily. Refills: 5      STOP taking these medications     docusate sodium (COLACE) 100 MG capsule      nystatin (MYCOSTATIN) 100000 UNIT/ML suspension  Allergies  Allergen Reactions  . Aciphex [Rabeprazole Sodium]     unknown  . Amantadines     unknown  . Avapro [Irbesartan]     unknown  . Carbidopa-Levodopa     unknown  . Cardura [Doxazosin Mesylate]     unknown  . Ciprofloxacin     unknown  . Clonidine Derivatives     unknown  . Cozaar     nightmares  . Diltiazem     unknown  . Famotidine     unknown  . Hctz [Hydrochlorothiazide]     unknown  . Indapamide     unknown  . Lansoprazole     unknown  . Lisinopril     Mouth problems  . Loratadine     unknown  . Maxidex [Dexamethasone]     unknown  . Mirapex [Pramipexole Dihydrochloride]     unknown  . Norvasc [Amlodipine Besylate]     unknown  . Pantoprazole Sodium     unknown  . Prilosec [Omeprazole]     unknown  . Procardia [Nifedipine]     unknown  . Proton Pump Inhibitors     unknown  . Sulfa Drugs Cross Reactors     unknown  . Trimethoprim     unknown  . Zyrtec [Cetirizine  Hcl]     unknown  . Penicillins Rash    Has patient had a PCN reaction causing immediate rash, facial/tongue/throat swelling, SOB or lightheadedness with hypotension: just a rash all over Has patient had a PCN reaction causing severe rash involving mucus membranes or skin necrosis: no Has patient had a PCN reaction that required hospitalization-no Has patient had a PCN reaction occurring within the last 10 years: no, more than 50 yrs ago If all of the above answers are "NO", then may proceed with Cephalosporin use.       The results of significant diagnostics from this hospitalization (including imaging, microbiology, ancillary and laboratory) are listed below for reference.    Significant Diagnostic Studies: Dg Chest 2 View  02/12/2015  CLINICAL DATA:  Shortness of breath starting last night, weakness, chest pain EXAM: CHEST  2 VIEW COMPARISON:  12/21/2013 FINDINGS: Cardiomediastinal silhouette is stable. There is patchy in bilateral airspace disease and mild interstitial prominence suspicious for pulmonary edema. Streaky mild basilar atelectasis or infiltrate. IMPRESSION: There is patchy in bilateral airspace disease and mild interstitial prominence suspicious for pulmonary edema. Streaky mild basilar atelectasis or infiltrate. Electronically Signed   By: Lahoma Crocker M.D.   On: 02/12/2015 13:24   Ct Head Wo Contrast  01/27/2015  CLINICAL DATA:  Fall this morning, striking head. Patient on blood thinners. Bruising right side of head. EXAM: CT HEAD WITHOUT CONTRAST TECHNIQUE: Contiguous axial images were obtained from the base of the skull through the vertex without intravenous contrast. COMPARISON:  03/01/2014 FINDINGS: There is atrophy and chronic small vessel disease changes. No acute intracranial abnormality. Specifically, no hemorrhage, hydrocephalus, mass lesion, acute infarction, or significant intracranial injury. No acute calvarial abnormality. Visualized paranasal sinuses and mastoids  clear. Orbital soft tissues unremarkable. IMPRESSION: No acute intracranial abnormality. Atrophy, chronic microvascular disease. Electronically Signed   By: Rolm Baptise M.D.   On: 01/27/2015 10:20   Ct Abdomen Pelvis W Contrast  01/25/2015  CLINICAL DATA:  Patient c/o mid abdominal pain, N/V/D since 0800 today. EXAM: CT ABDOMEN AND PELVIS WITH CONTRAST TECHNIQUE: Multidetector CT imaging of the abdomen and pelvis was performed using the standard protocol following bolus administration of intravenous contrast. CONTRAST:  67mL OMNIPAQUE IOHEXOL 300 MG/ML  SOLN COMPARISON:  None. FINDINGS: Lung bases: Mild interstitial thickening. Mild subsegmental atelectasis. No evidence of pneumonia or edema. No pleural effusion. Heart mildly enlarged. Liver and spleen:  Normal. Gallbladder and biliary tree: Gallbladder surgically absent. No bile duct dilation. Pancreas:  Unremarkable. Adrenal glands:  No masses. Kidneys, ureters, bladder: Bilateral renal cortical thinning. No renal masses or stones. No hydronephrosis. Normal ureters. Bladder is unremarkable. Uterus and adnexa:  Normal. Lymph nodes:  No adenopathy. Ascites:  None. Vascular: Atherosclerotic calcifications noted throughout the abdominal aorta and iliac arteries. No aneurysm. Gastrointestinal: Stomach is moderately distended but otherwise unremarkable. Normal small bowel. Colon is mostly decompressed. No convincing wall thickening. No inflammatory changes. Normal appendix visualized. Musculoskeletal: Severe compression fracture of L1, which appears chronic. No other fractures. Degenerative changes noted throughout the visualized spine. Bones are diffusely demineralized. IMPRESSION: 1. No acute findings within the abdomen or pelvis. 2. Colon is mostly decompressed. There is no convincing colonic wall thickening or mesenteric inflammation. 3. Status post cholecystectomy. 4. Mild bilateral renal cortical thinning. 5. Degenerative changes of spine and chronic  compression fracture of L1. Electronically Signed   By: Lajean Manes M.D.   On: 01/25/2015 15:29   Dg Hip Unilat With Pelvis 2-3 Views Right  01/27/2015  CLINICAL DATA:  Fall, right hip pain EXAM: DG HIP (WITH OR WITHOUT PELVIS) 2-3V RIGHT COMPARISON:  None. FINDINGS: Three views of the right hip submitted. No acute fracture or subluxation. Mild degenerative changes bilateral hip joints with bilateral superior acetabular spurring. Mild degenerative changes pubic symphysis. IMPRESSION: No acute fracture or subluxation. Mild degenerative changes bilateral hip joints. Electronically Signed   By: Lahoma Crocker M.D.   On: 01/27/2015 10:12   Echocardiogram Left ventricle: The cavity size was normal. Wall thickness was normal. Systolic function was vigorous. The estimated ejection fraction was in the range of 65% to 70%. Wall motion was normal; there were no regional wall motion abnormalities. Doppler parameters are consistent with abnormal left ventricular relaxation (grade 1 diastolic dysfunction). Doppler parameters are consistent with high ventricular filling pressure. - Aortic valve: There was mild regurgitation. - Mitral valve: Calcified annulus. Mildly thickened, mildly calcified leaflets . - Left atrium: The atrium was mildly dilated. Volume/bsa, ES, (1-plane Simpson&'s, A2C): 43.1 ml/m^2. - Tricuspid valve: There was trivial regurgitation.  Impressions:  - Compared to the prior study, there has been no significant interval change.  Microbiology: No results found for this or any previous visit (from the past 240 hour(s)).   Labs: Basic Metabolic Panel:  Recent Labs Lab 02/12/15 1237 02/13/15 0418 02/14/15 0258  NA 134* 136 136  K 4.8 4.5 4.1  CL 98* 96* 97*  CO2 28 33* 35*  GLUCOSE 169* 88 112*  BUN 26* 32* 30*  CREATININE 0.97 1.11* 0.95  CALCIUM 9.0 8.7* 8.5*   Liver Function Tests: No results for input(s): AST, ALT, ALKPHOS, BILITOT, PROT, ALBUMIN in  the last 168 hours. No results for input(s): LIPASE, AMYLASE in the last 168 hours. No results for input(s): AMMONIA in the last 168 hours. CBC:  Recent Labs Lab 02/12/15 1237 02/13/15 0418  WBC 4.5 5.9  NEUTROABS  --  3.4  HGB 11.0* 10.0*  HCT 35.4* 32.7*  MCV 95.2 97.0  PLT 128* 127*   Cardiac Enzymes: No results for input(s): CKTOTAL, CKMB, CKMBINDEX, TROPONINI in the last 168 hours. BNP: BNP (last 3 results)  Recent Labs  02/12/15 1237  BNP 1814.9*    ProBNP (last  3 results) No results for input(s): PROBNP in the last 8760 hours.  CBG: No results for input(s): GLUCAP in the last 168 hours.     SignedDelfina Redwood  [facility] 02/14/2015, 1:02 PM

## 2015-02-14 NOTE — Progress Notes (Addendum)
Pt in stable condition, IV taken out, Cardiac monitor DC, CCMD notified. eport called to [employer] , Therapist, sports at  [facility]. Pt taken off the unit via wheelcahir by a volunteer Oren Beckmann, RN

## 2015-02-14 NOTE — Care Management Note (Signed)
Case Management Note  Patient Details  Name: [name] MRN: [id number] Date of Birth: [birth year]  Subjective/Objective:     Pt admitted with dyspnea               Action/Plan:  Pt is from home with husband, however family doesn't feel that they can take care of her with current needs, husband just recently had surgery.  Request is for pt to discharge to SNF for rehab   Expected Discharge Date:                  Expected Discharge Plan:  [place] (Pt is from home with husband Timmothy Sours, daughter [name] is the support system)  In-House Referral:  Clinical Social Work  Ship broker  CM Consult  Post Acute Care Choice:    Choice offered to:     DME Arranged:    DME Agency:     HH Arranged:    [place] Agency:     Status of Service:  Completed, signed off  Medicare Important Message Given:    Date Medicare IM Given:    Medicare IM give by:    Date Additional Medicare IM Given:    Additional Medicare Important Message give by:     If discussed at [place] of Stay Meetings, dates discussed:    Additional Comments: Pt will discharge to SNF today 02/14/2015  Maryclare Labrador, RN 02/14/2015, 2:52 PM

## 2015-02-14 NOTE — Progress Notes (Addendum)
Bed offers emailed to pt dtr who is helping with SNF choice-dtr chooses [facility]- facility can accept pt today if stable for DC  CSW will continue to follow.  Domenica Reamer, [place] Social Worker [phone]

## 2015-02-14 NOTE — Progress Notes (Signed)
Speech Language Pathology Treatment: Dysphagia  Patient Details Name: DEVI KUJAWA MRN: [id number] DOB: [birth year] Today's Date: 02/14/2015 Time: 1340-1400 SLP Time Calculation (min) (ACUTE ONLY): 20 min  Assessment / Plan / Recommendation Clinical Impression  Skilled observation of consumption of nectar vs thin liquids with minimal verbal cues provided during observation and with varying volumes of liquids to determine safest liquid consistency consumption.  Pt exhibited delayed throat clearing with thin liquids via straw/cup, but this did not occur with nectar-thickened liquids.  Pt appeared to be able to tolerate tsp amounts of thin without overt s/s of aspiration at this time.  Continue current POC utilizing differential dx with potential upgrade to thin liquids with swallowing strategies in place; continue Dysphagia 3/nectar-thickened liquids at this time d/t multiple medical issues with ST to f/u at D/C at SNF.   HPI HPI: Patient is a 81 y/o female presents with SOB. Chest x-ray shows patchy infiltrates. Pt reportedly had acute pulmonary edema secondary to possibly aspiration pneumonitis. PMH includes HTN, PD, orthostatic hypotension, dizziness, tremor, anemoa, breast ca, stage 3 kidney failure. Previous h/o dysphagia with MBS in 12/27/13 recommending regular diet and thin liquids with Max cues needed for chin tuck versus Dys 3 diet and nectar thick liquids without additional compensatory strategies. At that time, pt and family had opted for the softer diet and thickened liquids.      SLP Plan  Continue with current plan of care     Recommendations  Diet recommendations: Dysphagia 3 (mechanical soft);Nectar-thick liquid Liquids provided via: Cup;Straw Medication Administration: Whole meds with puree Supervision: Intermittent supervision to cue for compensatory strategies Compensations: Slow rate;Small sips/bites Postural Changes and/or Swallow Maneuvers: Seated upright 90 degrees             Oral Care Recommendations: Oral care BID Follow up Recommendations: Skilled Nursing facility Plan: Continue with current plan of care   ADAMS,PAT, M.S., CCC-SLP 02/14/2015, 2:02 PM

## 2015-02-14 NOTE — Progress Notes (Signed)
Patient will discharge to [facility] SNF Anticipated discharge date: 02/14/15 Family notified: pt dtr Transportation by pt dtr  CSW signing off.  Domenica Reamer, [place] Social Worker [phone]

## 2015-02-15 LAB — GI PATHOGEN PANEL BY PCR, STOOL
C difficile toxin A/B: NOT DETECTED
CAMPYLOBACTER BY PCR: NOT DETECTED
Cryptosporidium by PCR: NOT DETECTED
E COLI 0157 BY PCR: NOT DETECTED
E coli (ETEC) LT/ST: NOT DETECTED
E coli (STEC): NOT DETECTED
G LAMBLIA BY PCR: NOT DETECTED
NOROVIRUS G1/G2: NOT DETECTED
ROTAVIRUS A BY PCR: NOT DETECTED
SHIGELLA BY PCR: NOT DETECTED
Salmonella by PCR: NOT DETECTED

## 2015-02-18 ENCOUNTER — Encounter: Payer: Self-pay | Admitting: Internal Medicine

## 2015-02-18 ENCOUNTER — Non-Acute Institutional Stay (SKILLED_NURSING_FACILITY): Payer: Medicare Other | Admitting: Internal Medicine

## 2015-02-18 DIAGNOSIS — E785 Hyperlipidemia, unspecified: Secondary | ICD-10-CM

## 2015-02-18 DIAGNOSIS — M3500 Sicca syndrome, unspecified: Secondary | ICD-10-CM

## 2015-02-18 DIAGNOSIS — E034 Atrophy of thyroid (acquired): Secondary | ICD-10-CM | POA: Diagnosis not present

## 2015-02-18 DIAGNOSIS — A09 Infectious gastroenteritis and colitis, unspecified: Secondary | ICD-10-CM | POA: Diagnosis not present

## 2015-02-18 DIAGNOSIS — E038 Other specified hypothyroidism: Secondary | ICD-10-CM | POA: Diagnosis not present

## 2015-02-18 DIAGNOSIS — I5033 Acute on chronic diastolic (congestive) heart failure: Secondary | ICD-10-CM

## 2015-02-18 DIAGNOSIS — R197 Diarrhea, unspecified: Secondary | ICD-10-CM

## 2015-02-18 DIAGNOSIS — B001 Herpesviral vesicular dermatitis: Secondary | ICD-10-CM | POA: Diagnosis not present

## 2015-02-18 DIAGNOSIS — I110 Hypertensive heart disease with heart failure: Secondary | ICD-10-CM | POA: Diagnosis not present

## 2015-02-18 NOTE — Assessment & Plan Note (Signed)
Patient was started empirically on oral vancomycin, given her recent history of C. difficile colitis. GI pathogen panel collected and at this time is pending. Diarrhea has improved. Will treat with a 10 day course, however may stop vancomycin if GI pathogen panel comes back negative SNF - C. Diff is neg, vancomycin has been d/c

## 2015-02-18 NOTE — Progress Notes (Signed)
MRN: [id number] Name: Victoria Lindsey  Sex: female Age: 81 y.o. DOB: March 05, 1926  [place] [facility]: [name] Facility/Room:108 Level Of Care: SNF Provider: Inocencio Homes D Emergency Contacts: Extended Emergency Contact Information Primary Emergency Contact: Daley,Don Address: [address]          [place] [address] [name] of [place] Phone: [phone] Mobile Phone: [phone] Relation: Spouse Secondary Emergency Contact: Church,Donna Address: [address]          [place], [place] [address] [name] of [place] Phone: [phone] Mobile Phone: [phone] Relation: Daughter  Code Status:   Allergies: Aciphex; Amantadines; Avapro; Carbidopa-levodopa; Cardura; Ciprofloxacin; Clonidine derivatives; Cozaar; Diltiazem; Famotidine; Hctz; Indapamide; Lansoprazole; Lisinopril; Loratadine; Maxidex; Mirapex; Norvasc; Pantoprazole sodium; Prilosec; Procardia; Proton pump inhibitors; Sulfa drugs cross reactors; Trimethoprim; Zyrtec; and Penicillins  Chief Complaint  Patient presents with  . New Admit To SNF    HPI: Patient is 81 y.o. female with history of diastolic dysfunction on echocardiogram, but no known history of heart failure, per chart review, recently treated for C. difficile colitis (C. difficile antigen positive, toxin negative), with recent allergic reaction to Flagyl, hypertension, Parkinson's disease, Sjogren's syndrome and Raynaud's disease who presents with shortness of breath. Pt was admitttt[facility] from [facility]9 for CHF and diarrhea which resolved then recurred again. Pt was being d/c to home but was so weak that she was admitted to SNF instead. While at SNF pt will be followed for hypothyroidism, tx with synthroid, HTN, tx with metoprolol, spironolactone and lasix, and HLD, tx with crestor.   Past Medical History  Diagnosis Date  . Hypertension   . Parkinson's disease   . Hyperlipidemia   . Dizziness   . Orthostatic hypotension   . Anemia    . Tremor   . Breast cancer ([place])     LEFT BREAST  . Kidney failure     stage 3  . Hypothyroidism   . Prediabetes   . TIA (transient ischemic attack)   . Hyperglycemia   . Left ventricular diastolic dysfunction   . Hypophonia     Past Surgical History  Procedure Laterality Date  . Cardiovascular stress test  06/2007    NORMAL  . Breast lumpectomy  2010  . Cholecystectomy  1994  . Tonsillectomy    . Nasal sinus surgery      submucous resection late 1950s  . Cataract extraction  2005,2006    Dr.Devonzo      Medication List       This list is accurate as of: 02/18/15 11:59 PM.  Always use your most recent med list.               AMBULATORY NON FORMULARY MEDICATION  Outpatient Speech Therapy, evaluate and treat.  DX: G20, R49.8     aspirin 81 MG tablet  Take 81 mg by mouth every evening.     AZILECT 1 MG Tabs tablet  Generic drug:  rasagiline  Take 1 mg by mouth daily.     BIOTIN PO  Take 1,000 mcg by mouth daily.     clopidogrel 75 MG tablet  Commonly known as:  PLAVIX  Take 75 mg by mouth every evening.     Cranberry 500 MG Caps  Take 500 mg by mouth daily.     folic acid Q000111Q MCG tablet  Commonly known as:  FOLVITE  Take 800 mcg by mouth daily.     furosemide 40 MG tablet  Commonly known as:  LASIX  Take 1 tablet (40 mg total) by mouth daily.     gabapentin 100 MG capsule  Commonly known as:  NEURONTIN  Take 100 mg by mouth 3 (three) times daily as needed (pain).     iron polysaccharides 150 MG capsule  Commonly known as:  NIFEREX  Take 150 mg by mouth every evening.     levothyroxine 125 MCG tablet  Commonly known as:  SYNTHROID  Take 1 tablet (125 mcg total) by mouth daily before breakfast.     metoprolol 100 MG tablet  Commonly known as:  LOPRESSOR  Take 1 tablet (100 mg total) by mouth 2 (two) times daily.     multivitamin with minerals Tabs tablet  Take 1 tablet by mouth daily.     ondansetron 4 MG tablet  Commonly known as:   ZOFRAN  Take 1 tablet (4 mg total) by mouth every 8 (eight) hours as needed for nausea or vomiting.     pilocarpine 5 MG tablet  Commonly known as:  SALAGEN  Take 5 mg by mouth daily as needed (showgrens- dryness).     PROBIOTIC PO  Take 1 tablet by mouth daily.     rosuvastatin 10 MG tablet  Commonly known as:  CRESTOR  Take 1 tablet by mouth daily.     Rotigotine 8 MG/24HR Pt24  Place 1 patch onto the skin every evening.     spironolactone 25 MG tablet  Commonly known as:  ALDACTONE  Take 1 tablet (25 mg total) by mouth daily.     SYSTANE OP  Apply 1 drop to eye 2 (two) times daily as needed (dryness).     vancomycin 50 mg/mL oral solution  Commonly known as:  VANCOCIN  Take 2.5 mLs (125 mg total) by mouth every 6 (six) hours. Through 02/21/15, unless GI pathogen panel negative, then may stop vancomycin.     Vitamin D3 2000 UNITS Tabs  Take 1 tablet by mouth daily.        No orders of the defined types were placed in this encounter.    Immunization History  Administered Date(s) Administered  . Hepatitis B 07/20/2011  . Influenza Split 11/08/2011  . Influenza,inj,Quad PF,36+ Mos 11/25/2014  . Influenza-Unspecified 12/12/2013  . Pneumococcal-Unspecified 05/23/2013  . Tdap 02/09/2012  . Zoster 02/09/2012    Social History  Substance Use Topics  . Smoking status: Former Smoker -- 0.50 packs/day for 12 years    Types: Cigarettes    Quit date: 03/08/1964  . Smokeless tobacco: Never Used  . Alcohol Use: No    Family history is + HD, COPD, Parkinsons    Review of Systems  DATA OBTAINED: from patient, nurse, daughter and husband GENERAL:  no fevers,+ fatigue, no appetite changes SKIN: No itching, rash or wounds EYES: No eye pain, redness, discharge EARS: No earache, tinnitus, change in hearing NOSE: No congestion, drainage or bleeding  MOUTH/THROAT: has sore on inner lower lip, has trouble with keeping mouth moist RESPIRATORY: No cough, wheezing,  SOB CARDIAC: No chest pain, palpitations, lower extremity edema  GI: No abdominal pain, No N/V/D or constipation, No heartburn or reflux  GU: No dysuria, frequency or urgency, or incontinence  MUSCULOSKELETAL: No unrelieved bone/joint pain NEUROLOGIC: No headache, dizziness or focal weakness PSYCHIATRIC: No c/o anxiety or sadness   Filed Vitals:   02/18/15 1327  BP: 125/61  Pulse: 77  Temp: 97.7 F (36.5 C)  Resp: 18    SpO2 Readings from Last 1 Encounters:  02/14/15 94%  Physical Exam  GENERAL APPEARANCE: Alert, conversant,  No acute distress.  SKIN: No diaphoresis rash HEAD: Normocephalic, atraumatic  EYES: Conjunctiva/lids clear. Pupils round, reactive. EOMs intact.  EARS: External exam WNL, canals clear. Hearing grossly normal.  NOSE: No deformity or discharge.  MOUTH/THROAT: inside lower lip with shallow ulcer RESPIRATORY: Breathing is even, unlabored. Lung sounds are clear   CARDIOVASCULAR: Heart RRR no murmurs, rubs or gallops. Trace peripheral edema.   GASTROINTESTINAL: Abdomen is soft, non-tender, not distended w/ normal bowel sounds. GENITOURINARY: Bladder non tender, not distended  MUSCULOSKELETAL: No abnormal joints or musculature NEUROLOGIC:  Cranial nerves 2-12 grossly intact. Moves all extremities  PSYCHIATRIC: Mood and affect appropriate to situation, no behavioral issues  Patient Active Problem List   Diagnosis Date Noted  . Herpes labialis 02/23/2015  . Dysphagia 02/13/2015  . Diarrhea 02/12/2015  . Onychomycosis 01/17/2015  . Folliculitis 123456  . Encephalopathy, hypertensive 05/29/2014  . Dizziness of unknown cause 02/21/2014  . Left ventricular diastolic dysfunction with preserved systolic function ([place]) 99991111  . Hypertensive heart disease with CHF (congestive heart failure) ([place]) 12/30/2013  . Transaminitis 12/24/2013  . Palpitations 12/23/2013  . Respiratory insufficiency 12/22/2013  . Normocytic anemia 12/21/2013  .  Thrombocytopenia ([place]) 12/21/2013  . Abnormal ECG 12/15/2013  . Abnormal involuntary movement 12/15/2013  . Colorectal polyps 12/15/2013  . Chest pain 12/15/2013  . Arteriosclerosis of coronary artery 12/15/2013  . Chronic kidney disease (CKD), stage III (moderate) 12/15/2013  . Corn 12/15/2013  . Diabetes ([place]) 12/15/2013  . Breathing difficult 12/15/2013  . Acid reflux 12/15/2013  . HLD (hyperlipidemia) 12/15/2013  . BP (high blood pressure) 12/15/2013  . FOM (frequency of micturition) 12/15/2013  . Breast cancer, female ([place]) 12/15/2013  . Fungal infection of nail 12/15/2013  . Osteopenia 12/15/2013  . Raynaud's syndrome 12/15/2013  . Restless leg 12/15/2013  . Gougerout-Sjoegren syndrome ([place]) 12/15/2013  . Change in blood platelet count 12/15/2013  . Infection of urinary tract 12/15/2013  . Lower urinary tract infection 10/12/2013  . Idiopathic Parkinson's disease ([place]) 09/06/2013  . Bladder spasm 09/05/2013  . Urgency of micturation 09/05/2013  . Acute on chronic diastolic (congestive) heart failure ([place]) 06/15/2012  . Hyperglycemia 12/27/2011  . Sjogren's syndrome ([place]) 12/17/2010  . TIA (transient ischemic attack) 12/17/2010  . Benign hypertensive heart disease without heart failure 09/01/2010  . Parkinson's disease ([place]) 09/01/2010  . Hypothyroidism 09/01/2010  . Hypercholesterolemia 09/01/2010  . Chronic anemia 09/01/2010    CBC    Component Value Date/Time   WBC 5.9 02/13/2015 0418   WBC 6.3 01/08/2015 0806   WBC 5.7 06/26/2008 1326   RBC 3.37* 02/13/2015 0418   RBC 3.51* 01/08/2015 0806   RBC 3.91 12/21/2013 1940   RBC 3.62* 06/26/2008 1326   HGB 10.0* 02/13/2015 0418   HGB 12.2 06/26/2008 1326   HCT 32.7* 02/13/2015 0418   HCT 32.8* 01/08/2015 0806   HCT 35.7 06/26/2008 1326   PLT 127* 02/13/2015 0418   PLT 146 06/26/2008 1326   MCV 97.0 02/13/2015 0418   MCV 98.7 06/26/2008 1326   LYMPHSABS 1.4 02/13/2015 0418   LYMPHSABS 1.6 01/08/2015 0806    LYMPHSABS 2.0 06/26/2008 1326   MONOABS 0.8 02/13/2015 0418   MONOABS 0.8 06/26/2008 1326   EOSABS 0.3 02/13/2015 0418   EOSABS 0.2 04/08/2014 1029   BASOSABS 0.0 02/13/2015 0418   BASOSABS 0.0 01/08/2015 0806   BASOSABS 0.0 06/26/2008 1326    CMP     Component Value Date/Time   NA  136 02/14/2015 0258   NA 139 01/08/2015 0806   K 4.1 02/14/2015 0258   CL 97* 02/14/2015 0258   CO2 35* 02/14/2015 0258   GLUCOSE 112* 02/14/2015 0258   GLUCOSE 102* 01/08/2015 0806   BUN 30* 02/14/2015 0258   BUN 29* 01/08/2015 0806   CREATININE 0.95 02/14/2015 0258   CREATININE 0.8 03/07/2014 1155   CALCIUM 8.5* 02/14/2015 0258   PROT 8.4* 01/25/2015 1151   PROT 6.8 01/08/2015 0806   ALBUMIN 4.2 01/25/2015 1151   ALBUMIN 3.8 01/08/2015 0806   AST 75* 01/25/2015 1151   ALT 78* 01/25/2015 1151   ALKPHOS 176* 01/25/2015 1151   BILITOT 0.4 01/25/2015 1151   BILITOT 0.3 01/08/2015 0806   GFRNONAA 52* 02/14/2015 0258   GFRAA >60 02/14/2015 0258    Lab Results  Component Value Date   HGBA1C 7.0* 01/08/2015     Dg Chest 2 View  02/12/2015  CLINICAL DATA:  Shortness of breath starting last night, weakness, chest pain EXAM: CHEST  2 VIEW COMPARISON:  12/21/2013 FINDINGS: Cardiomediastinal silhouette is stable. There is patchy in bilateral airspace disease and mild interstitial prominence suspicious for pulmonary edema. Streaky mild basilar atelectasis or infiltrate. IMPRESSION: There is patchy in bilateral airspace disease and mild interstitial prominence suspicious for pulmonary edema. Streaky mild basilar atelectasis or infiltrate. Electronically Signed   By: Lahoma Crocker M.D.   On: 02/12/2015 13:24    Not all labs, radiology exams or other studies done during hospitalization come through on my EPIC note; however they are reviewed by me.    Assessment and Plan  Acute on chronic diastolic (congestive) heart failure ([place]) confirmed by BNP above the thousand. Diuresed well with IV Lasix. Dyspnea  resolved. . Echocardiogram unchanged from last year. Normal ejection fraction. Grade 1 diastolic dysfunction. Admission weight 119 pounds. Discharge weight 107 pounds.Patient had been on Lasix 20 mg daily. Will change to 40 mg daily. SNF - cont lasix 40 mg daily,  need monitoring of weights, basic metabolic panel, symptoms.  Diarrhea Patient was started empirically on oral vancomycin, given her recent history of C. difficile colitis. GI pathogen panel collected and at this time is pending. Diarrhea has improved. Will treat with a 10 day course, however may stop vancomycin if GI pathogen panel comes back negative SNF - C. Diff is neg, vancomycin has been d/c   Herpes labialis SNF - valtrex 2000 mg now and again in 12 hours. Also magic mouthwash for comfort  Sjogren's syndrome ([place]) SNF - continue oramoist; orders for offering liquids q 2 hours  Hypertensive heart disease with CHF (congestive heart failure) (H[facility]) SNF - stable on  Metoprolol 100 mg BID, spironolactone 25 mg daily and lasix 40 mg daily  Hypothyroidism SNF - TSH 9.96  In 11/2 ;cont synthroid 125 mcg daily and repeat TSH  HLD (hyperlipidemia) SNF - LDL 65, HDL 39 on crestor 10 mg daily;plan - cont current meds    Hennie Duos, MD

## 2015-02-18 NOTE — Assessment & Plan Note (Signed)
confirmed by BNP above the thousand. Diuresed well with IV Lasix. Dyspnea resolved. . Echocardiogram unchanged from last year. Normal ejection fraction. Grade 1 diastolic dysfunction. Admission weight 119 pounds. Discharge weight 107 pounds.Patient had been on Lasix 20 mg daily. Will change to 40 mg daily. SNF - cont lasix 40 mg daily,  need monitoring of weights, basic metabolic panel, symptoms.

## 2015-02-21 ENCOUNTER — Other Ambulatory Visit: Payer: Medicare Other

## 2015-02-23 ENCOUNTER — Encounter: Payer: Self-pay | Admitting: Internal Medicine

## 2015-02-23 DIAGNOSIS — B001 Herpesviral vesicular dermatitis: Secondary | ICD-10-CM | POA: Insufficient documentation

## 2015-02-23 NOTE — Assessment & Plan Note (Signed)
SNF - TSH 9.96  In 11/2 ;cont synthroid 125 mcg daily and repeat TSH

## 2015-02-23 NOTE — Assessment & Plan Note (Signed)
SNF - stable on  Metoprolol 100 mg BID, spironolactone 25 mg daily and lasix 40 mg daily

## 2015-02-23 NOTE — Assessment & Plan Note (Signed)
SNF - continue oramoist; orders for offering liquids q 2 hours

## 2015-02-23 NOTE — Assessment & Plan Note (Signed)
SNF - valtrex 2000 mg now and again in 12 hours. Also magic mouthwash for comfort

## 2015-02-23 NOTE — Assessment & Plan Note (Signed)
SNF - LDL 65, HDL 39 on crestor 10 mg daily;plan - cont current meds

## 2015-02-28 ENCOUNTER — Non-Acute Institutional Stay (SKILLED_NURSING_FACILITY): Payer: Medicare Other | Admitting: Internal Medicine

## 2015-02-28 ENCOUNTER — Encounter: Payer: Self-pay | Admitting: Internal Medicine

## 2015-02-28 DIAGNOSIS — B001 Herpesviral vesicular dermatitis: Secondary | ICD-10-CM

## 2015-02-28 DIAGNOSIS — R0789 Other chest pain: Secondary | ICD-10-CM | POA: Diagnosis not present

## 2015-02-28 NOTE — Progress Notes (Signed)
AdamdsMRN: [id number] Name: Victoria Lindsey  Sex: female Age: 81 y.o. DOB: 1926/02/17  [place] [facility]: [name] Facility/Room: Level Of Care: SNF Provider: Inocencio Homes D Emergency Contacts: Extended Emergency Contact Information Primary Emergency Contact: Kahre,Don Address: [address]          [place] [address] [name] of [place] Phone: [phone] Mobile Phone: [phone] Relation: Spouse Secondary Emergency Contact: Church,Donna Address: [address]          [place], [place] [address] [name] of [place] Phone: [phone] Mobile Phone: [phone] Relation: Daughter  Code Status:   Allergies: Aciphex; Amantadines; Avapro; Carbidopa-levodopa; Cardura; Ciprofloxacin; Clonidine derivatives; Cozaar; Diltiazem; Famotidine; Hctz; Indapamide; Lansoprazole; Lisinopril; Loratadine; Maxidex; Mirapex; Norvasc; Pantoprazole sodium; Prilosec; Procardia; Proton pump inhibitors; Sulfa drugs cross reactors; Trimethoprim; Zyrtec; and Penicillins  Chief Complaint  Patient presents with  . Acute Visit    HPI: Patient is 81 y.o. female who is being seen for 2 acute issues- ulcer in mout onset 1 days ago, hurts with eating, got better with valtrex. No fever- and L pist chest wall pain . Onset yesterday, worse with and reproduced by twisting motion done in rehab, better at rest. Not accompanied by SOB, weakness or nausea, no cough or cold.  Past Medical History  Diagnosis Date  . Hypertension   . Parkinson's disease   . Hyperlipidemia   . Dizziness   . Orthostatic hypotension   . Anemia   . Tremor   . Breast cancer ([place])     LEFT BREAST  . Kidney failure     stage 3  . Hypothyroidism   . Prediabetes   . TIA (transient ischemic attack)   . Hyperglycemia   . Left ventricular diastolic dysfunction   . Hypophonia     Past Surgical History  Procedure Laterality Date  . Cardiovascular stress test  06/2007    NORMAL  . Breast lumpectomy  2010  .  Cholecystectomy  1994  . Tonsillectomy    . Nasal sinus surgery      submucous resection late 1950s  . Cataract extraction  2005,2006    Dr.Devonzo      Medication List       This list is accurate as of: 02/28/15 11:59 PM.  Always use your most recent med list.               AMBULATORY NON FORMULARY MEDICATION  Outpatient Speech Therapy, evaluate and treat.  DX: G20, R49.8     aspirin 81 MG tablet  Take 81 mg by mouth every evening.     AZILECT 1 MG Tabs tablet  Generic drug:  rasagiline  Take 1 mg by mouth daily.     BIOTIN PO  Take 1,000 mcg by mouth daily.     clopidogrel 75 MG tablet  Commonly known as:  PLAVIX  Take 75 mg by mouth every evening.     Cranberry 500 MG Caps  Take 500 mg by mouth daily.     folic acid Q000111Q MCG tablet  Commonly known as:  FOLVITE  Take 800 mcg by mouth daily.     furosemide 40 MG tablet  Commonly known as:  LASIX  Take 1 tablet (40 mg total) by mouth daily.     gabapentin 100 MG capsule  Commonly known as:  NEURONTIN  Take 100 mg by mouth 3 (three) times daily as needed (pain).     iron polysaccharides 150 MG capsule  Commonly known as:  NIFEREX  Take 150 mg by mouth every evening.     levothyroxine 125 MCG tablet  Commonly known as:  SYNTHROID  Take 1 tablet (125 mcg total) by mouth daily before breakfast.     metoprolol 100 MG tablet  Commonly known as:  LOPRESSOR  Take 1 tablet (100 mg total) by mouth 2 (two) times daily.     multivitamin with minerals Tabs tablet  Take 1 tablet by mouth daily.     ondansetron 4 MG tablet  Commonly known as:  ZOFRAN  Take 1 tablet (4 mg total) by mouth every 8 (eight) hours as needed for nausea or vomiting.     pilocarpine 5 MG tablet  Commonly known as:  SALAGEN  Take 5 mg by mouth daily as needed (showgrens- dryness).     PROBIOTIC PO  Take 1 tablet by mouth daily.     rosuvastatin 10 MG tablet  Commonly known as:  CRESTOR  Take 1 tablet by mouth daily.      Rotigotine 8 MG/24HR Pt24  Place 1 patch onto the skin every evening.     spironolactone 25 MG tablet  Commonly known as:  ALDACTONE  Take 1 tablet (25 mg total) by mouth daily.     SYSTANE OP  Apply 1 drop to eye 2 (two) times daily as needed (dryness).     vancomycin 50 mg/mL oral solution  Commonly known as:  VANCOCIN  Take 2.5 mLs (125 mg total) by mouth every 6 (six) hours. Through 02/21/15, unless GI pathogen panel negative, then may stop vancomycin.     Vitamin D3 2000 UNITS Tabs  Take 1 tablet by mouth daily.        No orders of the defined types were placed in this encounter.    Immunization History  Administered Date(s) Administered  . Hepatitis B 07/20/2011  . Influenza Split 11/08/2011  . Influenza,inj,Quad PF,36+ Mos 11/25/2014  . Influenza-Unspecified 12/12/2013  . Pneumococcal-Unspecified 05/23/2013  . Tdap 02/09/2012  . Zoster 02/09/2012    Social History  Substance Use Topics  . Smoking status: Former Smoker -- 0.50 packs/day for 12 years    Types: Cigarettes    Quit date: 03/08/1964  . Smokeless tobacco: Never Used  . Alcohol Use: No    Review of Systems  DATA OBTAINED: from patient, nurse, husband GENERAL:  no fevers, fatigue, appetite changes SKIN: No itching, rash HEENT:painful ulcer inside lower lip RESPIRATORY: No cough, wheezing, SOB CARDIAC: No chest pain, palpitations, lower extremity edema  GI: No abdominal pain, No N/V/D or constipation, No heartburn or reflux  GU: No dysuria, frequency or urgency, or incontinence  MUSCULOSKELETAL: No unrelieved bone/joint pain NEUROLOGIC: No headache, dizziness  PSYCHIATRIC: No overt anxiety or sadness  Filed Vitals:   02/28/15 1356  BP: 127/64  Pulse: 70  Temp: 97.2 F (36.2 C)  Resp: 20    Physical Exam  GENERAL APPEARANCE: Alert, conversant, No acute distress  SKIN: No diaphoresis rash, or wounds HEENT: Unremarkable X shallow ulcer inside lower lip RESPIRATORY: Breathing is even,  unlabored. Lung sounds are clear   CARDIOVASCULAR: Heart RRR no murmurs, rubs or gallops. No peripheral edema  GASTROINTESTINAL: Abdomen is soft, non-tender, not distended w/ normal bowel sounds.  GENITOURINARY: Bladder non tender, not distended  MUSCULOSKELETAL: No abnormal joints or musculature NEUROLOGIC: Cranial nerves 2-12 grossly intact. Moves all extremities PSYCHIATRIC: Mood and affect appropriate to situation, no behavioral issues  Patient Active Problem List   Diagnosis Date Noted  . Herpes labialis 02/23/2015  .  Dysphagia 02/13/2015  . Diarrhea 02/12/2015  . Onychomycosis 01/17/2015  . Folliculitis 123456  . Encephalopathy, hypertensive 05/29/2014  . Dizziness of unknown cause 02/21/2014  . Left ventricular diastolic dysfunction with preserved systolic function ([place]) 99991111  . Hypertensive heart disease with CHF (congestive heart failure) ([place]) 12/30/2013  . Transaminitis 12/24/2013  . Palpitations 12/23/2013  . Respiratory insufficiency 12/22/2013  . Normocytic anemia 12/21/2013  . Thrombocytopenia ([place]) 12/21/2013  . Abnormal ECG 12/15/2013  . Abnormal involuntary movement 12/15/2013  . Colorectal polyps 12/15/2013  . Chest pain 12/15/2013  . Arteriosclerosis of coronary artery 12/15/2013  . Chronic kidney disease (CKD), stage III (moderate) 12/15/2013  . Corn 12/15/2013  . Diabetes ([place]) 12/15/2013  . Breathing difficult 12/15/2013  . Acid reflux 12/15/2013  . HLD (hyperlipidemia) 12/15/2013  . BP (high blood pressure) 12/15/2013  . FOM (frequency of micturition) 12/15/2013  . Breast cancer, female ([place]) 12/15/2013  . Fungal infection of nail 12/15/2013  . Osteopenia 12/15/2013  . Raynaud's syndrome 12/15/2013  . Restless leg 12/15/2013  . Gougerout-Sjoegren syndrome ([place]) 12/15/2013  . Change in blood platelet count 12/15/2013  . Infection of urinary tract 12/15/2013  . Lower urinary tract infection 10/12/2013  . Idiopathic Parkinson's disease  ([place]) 09/06/2013  . Bladder spasm 09/05/2013  . Urgency of micturation 09/05/2013  . Acute on chronic diastolic (congestive) heart failure ([place]) 06/15/2012  . Hyperglycemia 12/27/2011  . Sjogren's syndrome ([place]) 12/17/2010  . TIA (transient ischemic attack) 12/17/2010  . Benign hypertensive heart disease without heart failure 09/01/2010  . Parkinson's disease ([place]) 09/01/2010  . Hypothyroidism 09/01/2010  . Hypercholesterolemia 09/01/2010  . Chronic anemia 09/01/2010    CBC    Component Value Date/Time   WBC 5.9 02/13/2015 0418   WBC 6.3 01/08/2015 0806   WBC 5.7 06/26/2008 1326   RBC 3.37* 02/13/2015 0418   RBC 3.51* 01/08/2015 0806   RBC 3.91 12/21/2013 1940   RBC 3.62* 06/26/2008 1326   HGB 10.0* 02/13/2015 0418   HGB 12.2 06/26/2008 1326   HCT 32.7* 02/13/2015 0418   HCT 32.8* 01/08/2015 0806   HCT 35.7 06/26/2008 1326   PLT 127* 02/13/2015 0418   PLT 146 06/26/2008 1326   MCV 97.0 02/13/2015 0418   MCV 98.7 06/26/2008 1326   LYMPHSABS 1.4 02/13/2015 0418   LYMPHSABS 1.6 01/08/2015 0806   LYMPHSABS 2.0 06/26/2008 1326   MONOABS 0.8 02/13/2015 0418   MONOABS 0.8 06/26/2008 1326   EOSABS 0.3 02/13/2015 0418   EOSABS 0.2 04/08/2014 1029   BASOSABS 0.0 02/13/2015 0418   BASOSABS 0.0 01/08/2015 0806   BASOSABS 0.0 06/26/2008 1326    CMP     Component Value Date/Time   NA 136 02/14/2015 0258   NA 139 01/08/2015 0806   K 4.1 02/14/2015 0258   CL 97* 02/14/2015 0258   CO2 35* 02/14/2015 0258   GLUCOSE 112* 02/14/2015 0258   GLUCOSE 102* 01/08/2015 0806   BUN 30* 02/14/2015 0258   BUN 29* 01/08/2015 0806   CREATININE 0.95 02/14/2015 0258   CREATININE 0.8 03/07/2014 1155   CALCIUM 8.5* 02/14/2015 0258   PROT 8.4* 01/25/2015 1151   PROT 6.8 01/08/2015 0806   ALBUMIN 4.2 01/25/2015 1151   ALBUMIN 3.8 01/08/2015 0806   AST 75* 01/25/2015 1151   ALT 78* 01/25/2015 1151   ALKPHOS 176* 01/25/2015 1151   BILITOT 0.4 01/25/2015 1151   BILITOT 0.3 01/08/2015 0806    GFRNONAA 52* 02/14/2015 0258   GFRAA >60 02/14/2015 0258  Assessment and Plan  Herpes labialis Pt with new outbreak. Reported last outbreak resolved after tx. Will retreat with Valtrex. If third recurrence wil prophylax  Chest pain Chest wall pain by sx;pt has tylenola dnstronger pain meds    Hennie Duos, MD

## 2015-03-02 NOTE — Assessment & Plan Note (Addendum)
Chest wall pain by sx;pt has tylenol and stronger pain meds prn

## 2015-03-02 NOTE — Assessment & Plan Note (Addendum)
Pt with new outbreak. Reported last outbreak resolved after tx. Will retreat with Valtrex. If third recurrence wil prophylax

## 2015-03-06 ENCOUNTER — Encounter: Payer: Self-pay | Admitting: Internal Medicine

## 2015-03-06 ENCOUNTER — Non-Acute Institutional Stay (SKILLED_NURSING_FACILITY): Payer: Medicare Other | Admitting: Internal Medicine

## 2015-03-06 DIAGNOSIS — M3500 Sicca syndrome, unspecified: Secondary | ICD-10-CM

## 2015-03-06 DIAGNOSIS — B001 Herpesviral vesicular dermatitis: Secondary | ICD-10-CM | POA: Diagnosis not present

## 2015-03-06 DIAGNOSIS — I5033 Acute on chronic diastolic (congestive) heart failure: Secondary | ICD-10-CM

## 2015-03-06 DIAGNOSIS — E038 Other specified hypothyroidism: Secondary | ICD-10-CM

## 2015-03-06 NOTE — Progress Notes (Signed)
Patient ID: SHAHRZAD CHERAMIE, female   DOB: 1925/12/08, 81 y.o.   MRN: [id number] MRN: [id number] Name: TELENA SCHNAPP  Sex: female Age: 81 y.o. DOB: 04-Aug-1925  [place] #: [name] Facility/Room:108 Level Of Care: SNF Provider: Wille Celeste Emergency Contacts: Extended Emergency Contact Information Primary Emergency Contact: [facility] Address: [address]          [place] [address] [name] of [place] Phone: [phone] Mobile Phone: [phone] Relation: Spouse Secondary Emergency Contact: Church,Donna Address: [address]          [place], [place] [address] [name] of [place] Phone: [phone] Mobile Phone: [phone] Relation: Daughter  Code Status:   Allergies: Aciphex; Amantadines; Avapro; Carbidopa-levodopa; Cardura; Ciprofloxacin; Clonidine derivatives; Cozaar; Diltiazem; Famotidine; Hctz; Indapamide; Lansoprazole; Lisinopril; Loratadine; Maxidex; Mirapex; Norvasc; Pantoprazole sodium; Prilosec; Procardia; Proton pump inhibitors; Sulfa drugs cross reactors; Trimethoprim; Zyrtec; and Penicillins  Chief Complaint  Patient presents with  . Discharge Note    HPI: Patient is 81 y.o. female with history of diastolic dysfunction on echocardiogram, but no known history of heart failure, per chart review, recently treated for C. difficile colitis (C. difficile antigen positive, toxin negative), with recent allergic reaction to Flagyl, hypertension, Parkinson's disease, Sjogren's syndrome and Raynaud's disease who presented with shortness of breath. Pt was admitttted to [facility] from 12/7-9 for CHF and diarrhea which resolved then recurred again. Pt  originally was being d/c to home but was so weak that she was admitted to SNF instead. While at SNF pt  followed for hypothyroidism, tx with synthroid, HTN, tx with metoprolol, spironolactone and lasix, and HLD, tx with crestor.  She has gained some strength but he continues to have weakness and will need  continued PT and OT as well as home health support at home.  She also had an outbreak of herpes labialis and this has been successfully treated with Valtrex-this apparently has been her second outbreak  She will be going home with her husband who is very supportive and is in the room with her today.  Of note recent TSH did come back elevated at 9.02 we have increased her Synthroid up to 180 g a day-this will need updating in about 4 weeks.  Currently she has no complaints her vital signs appear to be stable  Past Medical History  Diagnosis Date  . Hypertension   . Parkinson's disease   . Hyperlipidemia   . Dizziness   . Orthostatic hypotension   . Anemia   . Tremor   . Breast cancer ([place])     LEFT BREAST  . Kidney failure     stage 3  . Hypothyroidism   . Prediabetes   . TIA (transient ischemic attack)   . Hyperglycemia   . Left ventricular diastolic dysfunction   . Hypophonia     Past Surgical History  Procedure Laterality Date  . Cardiovascular stress test  06/2007    NORMAL  . Breast lumpectomy  2010  . Cholecystectomy  1994  . Tonsillectomy    . Nasal sinus surgery      submucous resection late 1950s  . Cataract extraction  2005,2006    Dr.Devonzo      Medication List       This list is accurate as of: 03/06/15 11:59 PM.  Always use your most recent med list.               AMBULATORY NON FORMULARY MEDICATION  Outpatient Speech Therapy, evaluate and treat.  DX: G20, R49.8     aspirin 81 MG tablet  Take 81 mg by mouth every evening.     AZILECT 1 MG Tabs tablet  Generic drug:  rasagiline  Take 1 mg by mouth daily.     BIOTIN PO  Take 1,000 mcg by mouth daily.     clopidogrel 75 MG tablet  Commonly known as:  PLAVIX  Take 75 mg by mouth every evening.     Cranberry 500 MG Caps  Take 500 mg by mouth daily.     folic acid [id number] MCG tablet  Commonly known as:  FOLVITE  Take 800 mcg by mouth daily.     furosemide 40 MG tablet  Commonly known  as:  LASIX  Take 1 tablet (40 mg total) by mouth daily.     gabapentin 100 MG capsule  Commonly known as:  NEURONTIN  Take 100 mg by mouth 3 (three) times daily as needed (pain).     iron polysaccharides 150 MG capsule  Commonly known as:  NIFEREX  Take 150 mg by mouth every evening.     levothyroxine 125 MCG tablet  Commonly known as:  SYNTHROID  Take 1 tablet (125 mcg total) by mouth daily before breakfast.     metoprolol 100 MG tablet  Commonly known as:  LOPRESSOR  Take 1 tablet (100 mg total) by mouth 2 (two) times daily.     multivitamin with minerals Tabs tablet  Take 1 tablet by mouth daily.     ondansetron 4 MG tablet  Commonly known as:  ZOFRAN  Take 1 tablet (4 mg total) by mouth every 8 (eight) hours as needed for nausea or vomiting.     pilocarpine 5 MG tablet  Commonly known as:  SALAGEN  Take 5 mg by mouth daily as needed (showgrens- dryness).     PROBIOTIC PO  Take 1 tablet by mouth daily.     rosuvastatin 10 MG tablet  Commonly known as:  CRESTOR  Take 1 tablet by mouth daily.     Rotigotine 8 MG/24HR Pt24  Place 1 patch onto the skin every evening.     spironolactone 25 MG tablet  Commonly known as:  ALDACTONE  Take 1 tablet (25 mg total) by mouth daily.     SYSTANE OP  Apply 1 drop to eye 2 (two) times daily as needed (dryness).     Vitamin D3 2000 units Tabs  Take 1 tablet by mouth daily.       Of note her Synthroid has been increased up to 150 g a day   Immunization History  Administered Date(s) Administered  . Hepatitis B 07/20/2011  . Influenza Split 11/08/2011  . Influenza,inj,Quad PF,36+ Mos 11/25/2014  . Influenza-Unspecified 12/12/2013  . Pneumococcal-Unspecified 05/23/2013  . Tdap 02/09/2012  . Zoster 02/09/2012    Social History  Substance Use Topics  . Smoking status: Former Smoker -- 0.50 packs/day for 12 years    Types: Cigarettes    Quit date: 03/08/1964  . Smokeless tobacco: Never Used  . Alcohol Use: No     Family history is + HD, COPD, Parkinsons    Review of Systems  DATA OBTAINED: from patient, nurse,r and husband GENERAL:  no fevers,+ fatigue, no appetite changes SKIN: No itching, rash or wounds mouth sore has resolved as noted above EYES: No eye pain, redness, discharge EARS: No earache, tinnitus, change in hearing NOSE: No congestion, drainage or bleeding  MOUTH/THROAT: Lips sore has resolved, has trouble with  keeping mouth moist RESPIRATORY: No cough, wheezing, SOB CARDIAC: No chest pain, palpitations, lower extremity edema  GI: No abdominal pain, No N/V/D or constipation, No heartburn or reflux  GU: No dysuria, frequency or urgency, or incontinence  MUSCULOSKELETAL: No unrelieved bone/joint pain--has some continued weakness NEUROLOGIC: No headache, dizziness or focal weakness PSYCHIATRIC: No c/o anxiety or sadness   Filed Vitals:   03/06/15 1413  BP: 122/66  Pulse: 84  Temp: 97.2 F (36.2 C)  Resp: 16    SpO2 Readings from Last 1 Encounters:  02/14/15 94%        Physical Exam  GENERAL APPEARANCE: Alert, conversant,  No acute distress.  SKIN: No diaphoresis rash HEAD: Normocephalic, atraumatic  EYES: Conjunctiva/lids clear. Pupils round, reactive. EOMs intact.  EARS: External exam WNL, canals clear. Hearing grossly normal.  NOSE: No deformity or discharge.  MOUTH/THROAT: Lower lip lesion has resolved RESPIRATORY: Breathing is even, unlabored. Lung sounds are clear with shallow air entry  CARDIOVASCULAR: Heart RRR no murmurs, rubs or gallops. Trace peripheral edema.   GASTROINTESTINAL: Abdomen is soft, non-tender, somewhat protuberant w/ normal bowel sounds. GENITOURINARY: Bladder non tender, not distended  MUSCULOSKELETAL: No abnormal joints or musculature is able to stand and use her walker but is quite weak he got NEUROLOGIC:  Cranial nerves 2-12 grossly intact. Moves all extremities appears a slight tremor at rest right lower leg at times PSYCHIATRIC:  Mood and affect appropriate to situation, no behavioral issues  Patient Active Problem List   Diagnosis Date Noted  . Herpes labialis 02/23/2015  . Dysphagia 02/13/2015  . Diarrhea 02/12/2015  . Onychomycosis 01/17/2015  . Folliculitis [id number]  . Encephalopathy, hypertensive 05/29/2014  . Dizziness of unknown cause 02/21/2014  . Left ventricular diastolic dysfunction with preserved systolic function ([place]) [id number]  . Hypertensive heart disease with CHF (congestive heart failure) ([place]) 12/30/2013  . Transaminitis 12/24/2013  . Palpitations 12/23/2013  . Respiratory insufficiency 12/22/2013  . Normocytic anemia 12/21/2013  . Thrombocytopenia ([place]) 12/21/2013  . Abnormal ECG 12/15/2013  . Abnormal involuntary movement 12/15/2013  . Colorectal polyps 12/15/2013  . Chest pain 12/15/2013  . Arteriosclerosis of coronary artery 12/15/2013  . Chronic kidney disease (CKD), stage III (moderate) 12/15/2013  . Corn 12/15/2013  . Diabetes ([place]) 12/15/2013  . Breathing difficult 12/15/2013  . Acid reflux 12/15/2013  . HLD (hyperlipidemia) 12/15/2013  . BP (high blood pressure) 12/15/2013  . FOM (frequency of micturition) 12/15/2013  . Breast cancer, female ([place]) 12/15/2013  . Fungal infection of nail 12/15/2013  . Osteopenia 12/15/2013  . Raynaud's syndrome 12/15/2013  . Restless leg 12/15/2013  . Gougerout-Sjoegren syndrome ([place]) 12/15/2013  . Change in blood platelet count 12/15/2013  . Infection of urinary tract 12/15/2013  . Lower urinary tract infection 10/12/2013  . Idiopathic Parkinson's disease ([place]) 09/06/2013  . Bladder spasm 09/05/2013  . Urgency of micturation 09/05/2013  . Acute on chronic diastolic (congestive) heart failure ([place]) 06/15/2012  . Hyperglycemia 12/27/2011  . Sjogren's syndrome ([place]) 12/17/2010  . TIA (transient ischemic attack) 12/17/2010  . Benign hypertensive heart disease without heart failure 09/01/2010  . Parkinson's disease ([place]) 09/01/2010  .  Hypothyroidism 09/01/2010  . Hypercholesterolemia 09/01/2010  . Chronic anemia 09/01/2010   labs.  03/03/2015.  TSH-9.02.  02/17/2015.  Sodium 135 potassium 4.3 BUN 29 creatinine 0.9  CBC    Component Value Date/Time   WBC 5.9 02/13/2015 0418   WBC 6.3 01/08/2015 0806   WBC 5.7 06/26/2008 1326   RBC 3.37* 02/13/2015 0418  RBC 3.51* 01/08/2015 0806   RBC 3.91 12/21/2013 1940   RBC 3.62* 06/26/2008 1326   HGB 10.0* 02/13/2015 0418   HGB 12.2 06/26/2008 1326   HCT 32.7* 02/13/2015 0418   HCT 32.8* 01/08/2015 0806   HCT 35.7 06/26/2008 1326   PLT 127* 02/13/2015 0418   PLT 146 06/26/2008 1326   MCV 97.0 02/13/2015 0418   MCV 98.7 06/26/2008 1326   LYMPHSABS 1.4 02/13/2015 0418   LYMPHSABS 1.6 01/08/2015 0806   LYMPHSABS 2.0 06/26/2008 1326   MONOABS 0.8 02/13/2015 0418   MONOABS 0.8 06/26/2008 1326   EOSABS 0.3 02/13/2015 0418   EOSABS 0.2 04/08/2014 1029   BASOSABS 0.0 02/13/2015 0418   BASOSABS 0.0 01/08/2015 0806   BASOSABS 0.0 06/26/2008 1326    CMP     Component Value Date/Time   NA 136 02/14/2015 0258   NA 139 01/08/2015 0806   K 4.1 02/14/2015 0258   CL 97* 02/14/2015 0258   CO2 35* 02/14/2015 0258   GLUCOSE 112* 02/14/2015 0258   GLUCOSE 102* 01/08/2015 0806   BUN 30* 02/14/2015 0258   BUN 29* 01/08/2015 0806   CREATININE 0.95 02/14/2015 0258   CREATININE 0.8 03/07/2014 1155   CALCIUM 8.5* 02/14/2015 0258   PROT 8.4* 01/25/2015 1151   PROT 6.8 01/08/2015 0806   ALBUMIN 4.2 01/25/2015 1151   ALBUMIN 3.8 01/08/2015 0806   AST 75* 01/25/2015 1151   ALT 78* 01/25/2015 1151   ALKPHOS 176* 01/25/2015 1151   BILITOT 0.4 01/25/2015 1151   BILITOT 0.3 01/08/2015 0806   GFRNONAA 52* 02/14/2015 0258   GFRAA >60 02/14/2015 0258    Lab Results  Component Value Date   HGBA1C 7.0* 01/08/2015     Dg Chest 2 View  02/12/2015  CLINICAL DATA:  Shortness of breath starting last night, weakness, chest pain EXAM: CHEST  2 VIEW COMPARISON:  12/21/2013  FINDINGS: Cardiomediastinal silhouette is stable. There is patchy in bilateral airspace disease and mild interstitial prominence suspicious for pulmonary edema. Streaky mild basilar atelectasis or infiltrate. IMPRESSION: There is patchy in bilateral airspace disease and mild interstitial prominence suspicious for pulmonary edema. Streaky mild basilar atelectasis or infiltrate. Electronically Signed   By: Lahoma Crocker M.D.   On: 02/12/2015 13:24         Assessment and Plan   #1 acute on chronic diastolic CHF.  When she was diuresed in the hospital with IV Lasix-echocardiogram showed normal ejection fraction with grade 1 diastolic dysfunction.  She has gained a small amount of weight here however this is been desired I do not see significant edema on exam.  She is on Lasix 40 mg a day-as well as spironolactone 25 mg a day-- beta blocker metoprolol 100 mg twice a day will update a metabolic panel to ensure stability of renal function and electrolytes.  Clinically she appears stable with no complaints of chest pain or shortness of breath.  #2 diarrhea did have this significantly in the hospital she was started on oral vancomycin-diarrhea has improved she did receive again a course of vancomycin. #3 History of herpes labialia--she has completed a course of Valtrex-this has been 2 now-Dr. [name] did suggest possible prophylactic if this occurs again. #4 History of Sjogren's syndrome----she will need frequent liquids secondary complaints of dry mouth-  #5 history of hypothyroidism as noted above TSH is elevated at 9.02-will increase her Synthroid up to 150 g a day this will need repeating in about 4 weeks this will need to be  done by her primary care provider since she is about to be discharged-I did discuss this with her husband in the room.  History of hyperlipidemia LDL 65 HDL 39 and hospital she is on Crestor 10 mg a day at this point will defer any aggressive workup to primary care  provider.  Again patient will need continued PT and OT at home with her history of significant CHF-and weakness.  Also will update her CBC in metabolic panel for updated values.  [id number] note greater than 30 minutes spent on this discharge summary-greater than 50% of time spent coordinating plan of care for numerous diagnoses.     Jimy Gates C,

## 2015-03-14 DIAGNOSIS — I5033 Acute on chronic diastolic (congestive) heart failure: Secondary | ICD-10-CM | POA: Diagnosis not present

## 2015-03-14 DIAGNOSIS — G20 Parkinson's disease: Secondary | ICD-10-CM | POA: Diagnosis not present

## 2015-03-14 DIAGNOSIS — M6281 Muscle weakness (generalized): Secondary | ICD-10-CM | POA: Diagnosis not present

## 2015-03-14 DIAGNOSIS — R2681 Unsteadiness on feet: Secondary | ICD-10-CM | POA: Diagnosis not present

## 2015-03-17 ENCOUNTER — Ambulatory Visit: Payer: Medicare Other | Admitting: Internal Medicine

## 2015-03-17 DIAGNOSIS — I2510 Atherosclerotic heart disease of native coronary artery without angina pectoris: Secondary | ICD-10-CM | POA: Diagnosis not present

## 2015-03-17 DIAGNOSIS — G20 Parkinson's disease: Secondary | ICD-10-CM | POA: Diagnosis not present

## 2015-03-17 DIAGNOSIS — G9340 Encephalopathy, unspecified: Secondary | ICD-10-CM | POA: Diagnosis not present

## 2015-03-17 DIAGNOSIS — I130 Hypertensive heart and chronic kidney disease with heart failure and stage 1 through stage 4 chronic kidney disease, or unspecified chronic kidney disease: Secondary | ICD-10-CM | POA: Diagnosis not present

## 2015-03-17 DIAGNOSIS — B351 Tinea unguium: Secondary | ICD-10-CM | POA: Diagnosis not present

## 2015-03-17 DIAGNOSIS — E039 Hypothyroidism, unspecified: Secondary | ICD-10-CM | POA: Diagnosis not present

## 2015-03-17 DIAGNOSIS — E7800 Pure hypercholesterolemia, unspecified: Secondary | ICD-10-CM | POA: Diagnosis not present

## 2015-03-17 DIAGNOSIS — I5033 Acute on chronic diastolic (congestive) heart failure: Secondary | ICD-10-CM | POA: Diagnosis not present

## 2015-03-17 DIAGNOSIS — M8580 Other specified disorders of bone density and structure, unspecified site: Secondary | ICD-10-CM | POA: Diagnosis not present

## 2015-03-17 DIAGNOSIS — G2581 Restless legs syndrome: Secondary | ICD-10-CM | POA: Diagnosis not present

## 2015-03-17 DIAGNOSIS — E785 Hyperlipidemia, unspecified: Secondary | ICD-10-CM | POA: Diagnosis not present

## 2015-03-17 DIAGNOSIS — E1165 Type 2 diabetes mellitus with hyperglycemia: Secondary | ICD-10-CM | POA: Diagnosis not present

## 2015-03-17 DIAGNOSIS — N183 Chronic kidney disease, stage 3 (moderate): Secondary | ICD-10-CM | POA: Diagnosis not present

## 2015-03-17 DIAGNOSIS — R002 Palpitations: Secondary | ICD-10-CM | POA: Diagnosis not present

## 2015-03-17 DIAGNOSIS — E1122 Type 2 diabetes mellitus with diabetic chronic kidney disease: Secondary | ICD-10-CM | POA: Diagnosis not present

## 2015-03-17 DIAGNOSIS — L739 Follicular disorder, unspecified: Secondary | ICD-10-CM | POA: Diagnosis not present

## 2015-03-17 DIAGNOSIS — N3289 Other specified disorders of bladder: Secondary | ICD-10-CM | POA: Diagnosis not present

## 2015-03-17 DIAGNOSIS — D696 Thrombocytopenia, unspecified: Secondary | ICD-10-CM | POA: Diagnosis not present

## 2015-03-17 DIAGNOSIS — N390 Urinary tract infection, site not specified: Secondary | ICD-10-CM | POA: Diagnosis not present

## 2015-03-17 DIAGNOSIS — R1310 Dysphagia, unspecified: Secondary | ICD-10-CM | POA: Diagnosis not present

## 2015-03-17 DIAGNOSIS — D649 Anemia, unspecified: Secondary | ICD-10-CM | POA: Diagnosis not present

## 2015-03-17 DIAGNOSIS — C50919 Malignant neoplasm of unspecified site of unspecified female breast: Secondary | ICD-10-CM | POA: Diagnosis not present

## 2015-03-17 DIAGNOSIS — R197 Diarrhea, unspecified: Secondary | ICD-10-CM | POA: Diagnosis not present

## 2015-03-17 DIAGNOSIS — G459 Transient cerebral ischemic attack, unspecified: Secondary | ICD-10-CM | POA: Diagnosis not present

## 2015-03-17 DIAGNOSIS — M3500 Sicca syndrome, unspecified: Secondary | ICD-10-CM | POA: Diagnosis not present

## 2015-03-17 LAB — CBC AND DIFFERENTIAL
HCT: 36 % (ref 36–46)
HEMOGLOBIN: 11.5 g/dL — AB (ref 12.0–16.0)
PLATELETS: 127 10*3/uL — AB (ref 150–399)
WBC: 6.4 10*3/mL

## 2015-03-19 DIAGNOSIS — I5033 Acute on chronic diastolic (congestive) heart failure: Secondary | ICD-10-CM | POA: Diagnosis not present

## 2015-03-19 DIAGNOSIS — M6281 Muscle weakness (generalized): Secondary | ICD-10-CM | POA: Diagnosis not present

## 2015-03-19 DIAGNOSIS — G20 Parkinson's disease: Secondary | ICD-10-CM | POA: Diagnosis not present

## 2015-03-19 DIAGNOSIS — R2681 Unsteadiness on feet: Secondary | ICD-10-CM | POA: Diagnosis not present

## 2015-03-21 DIAGNOSIS — R2681 Unsteadiness on feet: Secondary | ICD-10-CM | POA: Diagnosis not present

## 2015-03-21 DIAGNOSIS — I5033 Acute on chronic diastolic (congestive) heart failure: Secondary | ICD-10-CM | POA: Diagnosis not present

## 2015-03-21 DIAGNOSIS — G20 Parkinson's disease: Secondary | ICD-10-CM | POA: Diagnosis not present

## 2015-03-21 DIAGNOSIS — M6281 Muscle weakness (generalized): Secondary | ICD-10-CM | POA: Diagnosis not present

## 2015-03-24 ENCOUNTER — Encounter: Payer: Self-pay | Admitting: Internal Medicine

## 2015-03-24 ENCOUNTER — Ambulatory Visit (INDEPENDENT_AMBULATORY_CARE_PROVIDER_SITE_OTHER): Payer: Medicare Other | Admitting: Internal Medicine

## 2015-03-24 VITALS — BP 120/58 | HR 78 | Temp 97.4°F | Ht 64.0 in | Wt 112.0 lb

## 2015-03-24 DIAGNOSIS — E785 Hyperlipidemia, unspecified: Secondary | ICD-10-CM

## 2015-03-24 DIAGNOSIS — B001 Herpesviral vesicular dermatitis: Secondary | ICD-10-CM | POA: Diagnosis not present

## 2015-03-24 DIAGNOSIS — K5909 Other constipation: Secondary | ICD-10-CM

## 2015-03-24 DIAGNOSIS — M6281 Muscle weakness (generalized): Secondary | ICD-10-CM | POA: Diagnosis not present

## 2015-03-24 DIAGNOSIS — G20 Parkinson's disease: Secondary | ICD-10-CM

## 2015-03-24 DIAGNOSIS — M810 Age-related osteoporosis without current pathological fracture: Secondary | ICD-10-CM | POA: Diagnosis not present

## 2015-03-24 DIAGNOSIS — I110 Hypertensive heart disease with heart failure: Secondary | ICD-10-CM | POA: Diagnosis not present

## 2015-03-24 DIAGNOSIS — K5900 Constipation, unspecified: Secondary | ICD-10-CM | POA: Diagnosis not present

## 2015-03-24 DIAGNOSIS — E034 Atrophy of thyroid (acquired): Secondary | ICD-10-CM | POA: Diagnosis not present

## 2015-03-24 DIAGNOSIS — E038 Other specified hypothyroidism: Secondary | ICD-10-CM | POA: Diagnosis not present

## 2015-03-24 DIAGNOSIS — R7401 Elevation of levels of liver transaminase levels: Secondary | ICD-10-CM

## 2015-03-24 DIAGNOSIS — R740 Nonspecific elevation of levels of transaminase and lactic acid dehydrogenase [LDH]: Secondary | ICD-10-CM

## 2015-03-24 DIAGNOSIS — R2681 Unsteadiness on feet: Secondary | ICD-10-CM | POA: Diagnosis not present

## 2015-03-24 DIAGNOSIS — I5033 Acute on chronic diastolic (congestive) heart failure: Secondary | ICD-10-CM | POA: Diagnosis not present

## 2015-03-24 DIAGNOSIS — G20A1 Parkinson's disease without dyskinesia, without mention of fluctuations: Secondary | ICD-10-CM

## 2015-03-24 NOTE — Patient Instructions (Signed)
Stop miralax now Start linzess in am Cont senna s twice daily until you have a successful bm.

## 2015-03-24 NOTE — Progress Notes (Signed)
Patient ID: Victoria Lindsey, female   DOB: Nov 08, 1925, 82 y.o.   MRN: [id number]   Location: [facility] Provider: Rexene Edison. Mariea Clonts, D.O., C.M.D.  Code Status: DNR Goals of Care: Advanced Directive information Does patient have an advance directive?: Yes, Type of Advance Directive: [place];Living will  Chief Complaint  Patient presents with  . Medical Management of Chronic Issues    follow-up from discharged from [facility]. Was in hospital 02/12/15 to 02/14/15 for CHF.  Here with daughter [name]    HPI: Patient is a 82 y.o. female seen in the office today for med mgt of chronic diseases and hospital and rehab followup.  Feeling pretty well, but had 2 falls.  One happened when trying to reach for something in the fridge, slid down onto floor.  Got up early on the weekend to use the bathroom, turned around to take synthroid lost balance and fell over holding onto her walker.  Has bandaid on right elbow.  They've relocated the synthroid bottle to the kitchen with water with it.  She is bruised on her right hip.  Walking slowly.  Very happy back home.  Says she will need to be carried to leave home again.    Hemorrhoids still bothersome.  Had been off all stool softeners and put back on one once home.  Still needing to take something else now.  Is taking stool softener twice a day and miralax when still does not go (about every other day) with "success" but not good success.  Sometimes smears and sometimes ok.  Gets pasty instead when uses the miralax.  Still taking align.   Had been off of crestor due to abnormal labs.  Wound up back on crestor at hospital.  Is off again at home.  Had been on 12/7-1/2 again.    Synthroid had been increased upon d/c from [facility].  She is still taking the name brand 142mcg.    Had herpes outbreak twice at nursing home, but resolved now.  Was treated with valtrex with benefit.  BP at goal with addition of lasix.  Also on  metoprolol and spironolactone.  Refuses mammograms.  Said she wouldn't do anything about it.  Still needs bone density.  Last was 2013.  Got forgotten in the course of acute problems but her daughter will schedule.  She also had acute on chronic diastolic chf and required diuresis at the hospital.  This was after a lot of hydration she needed during her several weeks of diarrhea.  It's still unclear if she actually had C diff or a viral cause.  She did not tolerate flagyl which caused a rash.  She never had a positive c diff PCR, but did seem to respond clinically to flagyl for diarrhea until she got the awful rash.  Review of Systems:  Review of Systems  Constitutional: Positive for weight loss and malaise/fatigue. Negative for fever and chills.  Respiratory: Negative for shortness of breath and wheezing.   Cardiovascular: Negative for chest pain.  Gastrointestinal: Positive for constipation. Negative for diarrhea, blood in stool and melena.       Painful hemorrhoids  Genitourinary: Positive for urgency and frequency. Negative for dysuria.  Musculoskeletal: Positive for falls.  Neurological: Positive for weakness. Negative for loss of consciousness.       Parkinsons  Endo/Heme/Allergies: Bruises/bleeds easily.  Psychiatric/Behavioral: Positive for memory loss.    Past Medical History  Diagnosis Date  . Hypertension   .  Parkinson's disease   . Hyperlipidemia   . Dizziness   . Orthostatic hypotension   . Anemia   . Tremor   . Breast cancer ([place])     LEFT BREAST  . Kidney failure     stage 3  . Hypothyroidism   . Prediabetes   . TIA (transient ischemic attack)   . Hyperglycemia   . Left ventricular diastolic dysfunction   . Hypophonia     Past Surgical History  Procedure Laterality Date  . Cardiovascular stress test  06/2007    NORMAL  . Breast lumpectomy  2010  . Cholecystectomy  1994  . Tonsillectomy    . Nasal sinus surgery      submucous resection late 1950s  .  Cataract extraction  2005,2006    Dr.Devonzo    Allergies  Allergen Reactions  . Aciphex [Rabeprazole Sodium]     unknown  . Amantadines     unknown  . Avapro [Irbesartan]     unknown  . Carbidopa-Levodopa     unknown  . Cardura [Doxazosin Mesylate]     unknown  . Ciprofloxacin     unknown  . Clonidine Derivatives     unknown  . Cozaar     nightmares  . Diltiazem     unknown  . Famotidine     unknown  . Hctz [Hydrochlorothiazide]     unknown  . Indapamide     unknown  . Lansoprazole     unknown  . Lisinopril     Mouth problems  . Loratadine     unknown  . Maxidex [Dexamethasone]     unknown  . Mirapex [Pramipexole Dihydrochloride]     unknown  . Norvasc [Amlodipine Besylate]     unknown  . Pantoprazole Sodium     unknown  . Prilosec [Omeprazole]     unknown  . Procardia [Nifedipine]     unknown  . Proton Pump Inhibitors     unknown  . Sulfa Drugs Cross Reactors     unknown  . Trimethoprim     unknown  . Zyrtec [Cetirizine Hcl]     unknown  . Penicillins Rash    Has patient had a PCN reaction causing immediate rash, facial/tongue/throat swelling, SOB or lightheadedness with hypotension: just a rash all over Has patient had a PCN reaction causing severe rash involving mucus membranes or skin necrosis: no Has patient had a PCN reaction that required hospitalization-no Has patient had a PCN reaction occurring within the last 10 years: no, more than 50 yrs ago If all of the above answers are "NO", then may proceed with Cephalosporin use.       Medication List       This list is accurate as of: 03/24/15  3:40 PM.  Always use your most recent med list.               AMBULATORY NON FORMULARY MEDICATION  Outpatient Speech Therapy, evaluate and treat.  DX: G20, R49.8     aspirin 81 MG tablet  Take 81 mg by mouth every evening.     AZILECT 1 MG Tabs tablet  Generic drug:  rasagiline  Take 1 mg by mouth daily.     BIOTIN PO  Take 1,000 mcg by  mouth daily.     clopidogrel 75 MG tablet  Commonly known as:  PLAVIX  Take 75 mg by mouth every evening.     Cranberry 500 MG Caps  Take 500 mg by mouth  daily.     folic acid Q000111Q MCG tablet  Commonly known as:  FOLVITE  Take 800 mcg by mouth daily.     furosemide 40 MG tablet  Commonly known as:  LASIX  Take 1 tablet (40 mg total) by mouth daily.     gabapentin 100 MG capsule  Commonly known as:  NEURONTIN  Take 100 mg by mouth 3 (three) times daily as needed (pain).     iron polysaccharides 150 MG capsule  Commonly known as:  NIFEREX  Take 150 mg by mouth every evening.     levothyroxine 150 MCG tablet  Commonly known as:  SYNTHROID, LEVOTHROID  Take 150 mcg by mouth daily before breakfast.     metoprolol 100 MG tablet  Commonly known as:  LOPRESSOR  Take 1 tablet (100 mg total) by mouth 2 (two) times daily.     multivitamin with minerals Tabs tablet  Take 1 tablet by mouth daily.     ondansetron 4 MG tablet  Commonly known as:  ZOFRAN  Take 1 tablet (4 mg total) by mouth every 8 (eight) hours as needed for nausea or vomiting.     pilocarpine 5 MG tablet  Commonly known as:  SALAGEN  Take 5 mg by mouth daily as needed (showgrens- dryness).     PROBIOTIC PO  Take 1 tablet by mouth daily.     rosuvastatin 10 MG tablet  Commonly known as:  CRESTOR  Take 1 tablet by mouth daily.     Rotigotine 8 MG/24HR Pt24  Place 1 patch onto the skin every evening.     spironolactone 25 MG tablet  Commonly known as:  ALDACTONE  Take 1 tablet (25 mg total) by mouth daily.     SYSTANE OP  Apply 1 drop to eye 2 (two) times daily as needed (dryness).     Vitamin D3 2000 units Tabs  Take 1 tablet by mouth daily.        Health Maintenance  Topic Date Due  . OPHTHALMOLOGY EXAM  02/04/1938  . PNA vac Low Risk Adult (2 of 2 - PCV13) 05/24/2014  . MAMMOGRAM  03/08/2048 (Originally 03/08/2012)  . HEMOGLOBIN A1C  07/08/2015  . INFLUENZA VACCINE  10/07/2015  . FOOT EXAM   10/08/2015  . URINE MICROALBUMIN  10/11/2015  . TETANUS/TDAP  02/08/2022  . DEXA SCAN  Completed  . ZOSTAVAX  Completed    Physical Exam: Filed Vitals:   03/24/15 1523  BP: 120/58  Pulse: 78  Temp: 97.4 F (36.3 C)  TempSrc: Oral  Height: 5\' 4"  (1.626 m)  Weight: 112 lb (50.803 kg)  SpO2: 98%   Body mass index is 19.22 kg/(m^2). Physical Exam  Constitutional: No distress.  Thin white female  Eyes:  glasses  Cardiovascular: Normal rate, regular rhythm and normal heart sounds.   Pulmonary/Chest: Effort normal and breath sounds normal.  Abdominal: Bowel sounds are normal.  Musculoskeletal: Normal range of motion.  Walks slowly and shuffles with walker  Neurological: She is alert.  Skin: Skin is warm and dry. There is pallor.  Small ecchymoses of her dorsum of hands    Labs reviewed: Basic Metabolic Panel:  Recent Labs  05/22/14 0942 10/08/14 0832  01/08/15 0806  02/12/15 1237 02/13/15 0418 02/14/15 0258  NA  --  142  < > 139  < > 134* 136 136  K  --  4.7  < > 4.7  < > 4.8 4.5 4.1  CL  --  100  < >  99  < > 98* 96* 97*  CO2  --  23  < > 25  < > 28 33* 35*  GLUCOSE  --  92  < > 102*  < > 169* 88 112*  BUN  --  34*  < > 29*  < > 26* 32* 30*  CREATININE  --  1.12*  < > 0.92  < > 0.97 1.11* 0.95  CALCIUM  --  8.9  < > 9.1  < > 9.0 8.7* 8.5*  TSH 4.080 5.220*  --  9.960*  --   --   --   --   < > = values in this interval not displayed. Liver Function Tests:  Recent Labs  10/08/14 0832 01/08/15 0806 01/25/15 1151  AST 44* 46* 75*  ALT 42* 46* 78*  ALKPHOS 127* 154* 176*  BILITOT 0.3 0.3 0.4  PROT 6.7 6.8 8.4*  ALBUMIN 3.9 3.8 4.2    Recent Labs  01/25/15 1151  LIPASE 33   No results for input(s): AMMONIA in the last 8760 hours. CBC:  Recent Labs  11/25/14 1218 01/08/15 0806  01/25/15 1151  02/12/15 1237 02/13/15 0418 03/17/15  WBC 7.2 6.3  < > 12.1*  --  4.5 5.9 6.4  NEUTROABS 4.4 3.7  --   --   --   --  3.4  --   HGB  --   --   --  12.4  <  > 11.0* 10.0* 11.5*  HCT 33.3* 32.8*  --  39.8  < > 35.4* 32.7* 36  MCV 94 93  --  96.6  --  95.2 97.0  --   PLT 133* 123*  --  99*  --  128* 127* 127*  < > = values in this interval not displayed. Lipid Panel:  Recent Labs  04/08/14 1029 10/08/14 0832 01/08/15 0806  CHOL 114 106 112  HDL 47 41 39*  LDLCALC 48 49 55  TRIG 96 81 89  CHOLHDL 2.4 2.6 2.9   Lab Results  Component Value Date   HGBA1C 7.0* 01/08/2015    Procedures since last visit: Reviewed d/c summary, notes from SNF  Assessment/Plan 1. Idiopathic Parkinson's disease ([place]) -stable, but has had a few falls since returning home due to weakness and some functional scenarios that have now been modified to avoid recurrences -cont current meds, walker use and home health pt, ot  2. Transaminitis - somehow crestor got resumed at the hospital when it was meant to be on hold due to liver abnormalities on labs so [name] has held it again since 1/3 or so and will recheck again in 1 month - Basic metabolic panel; Future - Hepatic Function Panel; Future  3. Hypertensive heart disease with CHF (congestive heart failure) (HCC) - diastolic -educated some on this today and situations where it tends to flare up -seems stable at present and asymptomatic - CBC with Differential/Platelet; Future  4. Hypothyroidism due to acquired atrophy of thyroid - is still taking 179mcg, but was on 180 for a period of time at [facility] so will also recheck this in 1 month - TSH; Future  5. Herpes labialis -resolved with valtrex x 2 episodes - CBC with Differential/Platelet; Future  6. HLD (hyperlipidemia) - off crestor at present and recheck liver in 1 month, if normalized, might consider gradually resuming and monitor--appears she hasn't had a panel since Nov 19th that includes the liver values - Hepatic Function Panel; Future  7.  Chronic constipation  with hemorrhoid pain -will try her on linzess -d/c miralax -cont senna until  has first bm on linzess, then d/c senna also and monitor -2 wks of samples given--if helping, Rx can be sent -my hope is to get her having regular consistency bms instead of hard stools one day and mushy stools another  8.  Senile osteoporosis: To schedule bone density  Labs/tests ordered:   Orders Placed This Encounter  Procedures  . CBC and differential    This external order was created through the Results Console.  . TSH    Standing Status: Future     Number of Occurrences:      Standing Expiration Date: 07/22/2015  . CBC with Differential/Platelet    Standing Status: Future     Number of Occurrences:      Standing Expiration Date: 07/22/2015  . Basic metabolic panel    Standing Status: Future     Number of Occurrences:      Standing Expiration Date: 07/22/2015  . Hepatic Function Panel    Standing Status: Future     Number of Occurrences:      Standing Expiration Date: 07/22/2015    Next appt:  04/24/2015 with labs before  Chama. Kree Rafter, D.O. [facility] [address], [place] [address] Cell Phone (Mon-Fri 8am-5pm):  [phone] On Call:  [phone] & follow prompts after 5pm & weekends Office Phone:  [phone] Office Fax:  [phone]

## 2015-03-26 DIAGNOSIS — R2681 Unsteadiness on feet: Secondary | ICD-10-CM | POA: Diagnosis not present

## 2015-03-26 DIAGNOSIS — M6281 Muscle weakness (generalized): Secondary | ICD-10-CM | POA: Diagnosis not present

## 2015-03-26 DIAGNOSIS — I5033 Acute on chronic diastolic (congestive) heart failure: Secondary | ICD-10-CM | POA: Diagnosis not present

## 2015-03-26 DIAGNOSIS — G20 Parkinson's disease: Secondary | ICD-10-CM | POA: Diagnosis not present

## 2015-03-28 ENCOUNTER — Telehealth: Payer: Self-pay | Admitting: *Deleted

## 2015-03-28 DIAGNOSIS — M6281 Muscle weakness (generalized): Secondary | ICD-10-CM | POA: Diagnosis not present

## 2015-03-28 DIAGNOSIS — G20 Parkinson's disease: Secondary | ICD-10-CM | POA: Diagnosis not present

## 2015-03-28 DIAGNOSIS — M810 Age-related osteoporosis without current pathological fracture: Secondary | ICD-10-CM

## 2015-03-28 DIAGNOSIS — R2681 Unsteadiness on feet: Secondary | ICD-10-CM | POA: Diagnosis not present

## 2015-03-28 DIAGNOSIS — I5033 Acute on chronic diastolic (congestive) heart failure: Secondary | ICD-10-CM | POA: Diagnosis not present

## 2015-03-28 NOTE — Telephone Encounter (Signed)
Order Placed and daughter notified.

## 2015-03-28 NOTE — Telephone Encounter (Signed)
Victoria Lindsey, daughter called and stated that you ordered a bone density for patient and it was scheduled at [facility]. The Breast center called her and stated that patient needed to go back to the same place where she previously had one done, so it would be the same machine due to the calibrations. Patient's previous PCP informed her that her last one was done at [facility]) [phone] on [address] in [place]. Daughter wants to know if you would rather her to go back there or just start over at [facility], she stated it didn't matter to her but would need another referral. Please Advise.

## 2015-03-28 NOTE — Telephone Encounter (Signed)
Let's have her go where she's been going.  I would like to get the old reports from the [facility] anyway for her chart and my own comparisons.  That will be an outside referral for bone density--I will sign it if you put a fresh order in please.

## 2015-03-31 DIAGNOSIS — M6281 Muscle weakness (generalized): Secondary | ICD-10-CM | POA: Diagnosis not present

## 2015-03-31 DIAGNOSIS — I5033 Acute on chronic diastolic (congestive) heart failure: Secondary | ICD-10-CM | POA: Diagnosis not present

## 2015-03-31 DIAGNOSIS — R2681 Unsteadiness on feet: Secondary | ICD-10-CM | POA: Diagnosis not present

## 2015-03-31 DIAGNOSIS — G20 Parkinson's disease: Secondary | ICD-10-CM | POA: Diagnosis not present

## 2015-04-01 DIAGNOSIS — I5033 Acute on chronic diastolic (congestive) heart failure: Secondary | ICD-10-CM | POA: Diagnosis not present

## 2015-04-01 DIAGNOSIS — R2681 Unsteadiness on feet: Secondary | ICD-10-CM | POA: Diagnosis not present

## 2015-04-01 DIAGNOSIS — M6281 Muscle weakness (generalized): Secondary | ICD-10-CM | POA: Diagnosis not present

## 2015-04-01 DIAGNOSIS — G20 Parkinson's disease: Secondary | ICD-10-CM | POA: Diagnosis not present

## 2015-04-02 DIAGNOSIS — I5033 Acute on chronic diastolic (congestive) heart failure: Secondary | ICD-10-CM | POA: Diagnosis not present

## 2015-04-02 DIAGNOSIS — M6281 Muscle weakness (generalized): Secondary | ICD-10-CM | POA: Diagnosis not present

## 2015-04-02 DIAGNOSIS — G20 Parkinson's disease: Secondary | ICD-10-CM | POA: Diagnosis not present

## 2015-04-02 DIAGNOSIS — R2681 Unsteadiness on feet: Secondary | ICD-10-CM | POA: Diagnosis not present

## 2015-04-04 DIAGNOSIS — M6281 Muscle weakness (generalized): Secondary | ICD-10-CM | POA: Diagnosis not present

## 2015-04-04 DIAGNOSIS — I5033 Acute on chronic diastolic (congestive) heart failure: Secondary | ICD-10-CM | POA: Diagnosis not present

## 2015-04-04 DIAGNOSIS — R2681 Unsteadiness on feet: Secondary | ICD-10-CM | POA: Diagnosis not present

## 2015-04-04 DIAGNOSIS — G20 Parkinson's disease: Secondary | ICD-10-CM | POA: Diagnosis not present

## 2015-04-07 DIAGNOSIS — I5033 Acute on chronic diastolic (congestive) heart failure: Secondary | ICD-10-CM | POA: Diagnosis not present

## 2015-04-07 DIAGNOSIS — M6281 Muscle weakness (generalized): Secondary | ICD-10-CM | POA: Diagnosis not present

## 2015-04-07 DIAGNOSIS — G20 Parkinson's disease: Secondary | ICD-10-CM | POA: Diagnosis not present

## 2015-04-07 DIAGNOSIS — R2681 Unsteadiness on feet: Secondary | ICD-10-CM | POA: Diagnosis not present

## 2015-04-08 DIAGNOSIS — I5033 Acute on chronic diastolic (congestive) heart failure: Secondary | ICD-10-CM | POA: Diagnosis not present

## 2015-04-08 DIAGNOSIS — R2681 Unsteadiness on feet: Secondary | ICD-10-CM | POA: Diagnosis not present

## 2015-04-08 DIAGNOSIS — M6281 Muscle weakness (generalized): Secondary | ICD-10-CM | POA: Diagnosis not present

## 2015-04-08 DIAGNOSIS — G20 Parkinson's disease: Secondary | ICD-10-CM | POA: Diagnosis not present

## 2015-04-09 DIAGNOSIS — Z780 Asymptomatic menopausal state: Secondary | ICD-10-CM | POA: Diagnosis not present

## 2015-04-09 DIAGNOSIS — M810 Age-related osteoporosis without current pathological fracture: Secondary | ICD-10-CM | POA: Diagnosis not present

## 2015-04-09 DIAGNOSIS — M6281 Muscle weakness (generalized): Secondary | ICD-10-CM | POA: Diagnosis not present

## 2015-04-09 DIAGNOSIS — G20 Parkinson's disease: Secondary | ICD-10-CM | POA: Diagnosis not present

## 2015-04-09 DIAGNOSIS — I5033 Acute on chronic diastolic (congestive) heart failure: Secondary | ICD-10-CM | POA: Diagnosis not present

## 2015-04-09 DIAGNOSIS — R2681 Unsteadiness on feet: Secondary | ICD-10-CM | POA: Diagnosis not present

## 2015-04-10 ENCOUNTER — Ambulatory Visit (INDEPENDENT_AMBULATORY_CARE_PROVIDER_SITE_OTHER): Payer: Medicare Other | Admitting: Cardiology

## 2015-04-10 ENCOUNTER — Encounter: Payer: Self-pay | Admitting: Cardiology

## 2015-04-10 VITALS — BP 144/82 | HR 78 | Ht 61.0 in | Wt 115.8 lb

## 2015-04-10 DIAGNOSIS — I10 Essential (primary) hypertension: Secondary | ICD-10-CM

## 2015-04-10 DIAGNOSIS — I5189 Other ill-defined heart diseases: Secondary | ICD-10-CM

## 2015-04-10 DIAGNOSIS — I519 Heart disease, unspecified: Secondary | ICD-10-CM | POA: Diagnosis not present

## 2015-04-10 DIAGNOSIS — G459 Transient cerebral ischemic attack, unspecified: Secondary | ICD-10-CM | POA: Diagnosis not present

## 2015-04-10 DIAGNOSIS — I509 Heart failure, unspecified: Secondary | ICD-10-CM

## 2015-04-10 MED ORDER — METOPROLOL TARTRATE 100 MG PO TABS: 100.0000 mg | ORAL_TABLET | Freq: Two times a day (BID) | ORAL | Status: DC

## 2015-04-10 MED ORDER — SPIRONOLACTONE 25 MG PO TABS: 25.0000 mg | ORAL_TABLET | Freq: Every day | ORAL | Status: DC

## 2015-04-10 MED ORDER — FUROSEMIDE 40 MG PO TABS: 40.0000 mg | ORAL_TABLET | Freq: Every day | ORAL | Status: DC

## 2015-04-10 NOTE — Patient Instructions (Signed)
Medication Instructions:  Your physician recommends that you continue on your current medications as directed. Please refer to the Current Medication list given to you today.  Labwork: none  Testing/Procedures: none  Follow-Up: Your physician wants you to follow-up in: 4 month ov with Dr Dawna Part will receive a reminder letter in the mail two months in advance. If you don't receive a letter, please call our office to schedule the follow-up appointment.  If you need a refill on your cardiac medications before your next appointment, please call your pharmacy.

## 2015-04-10 NOTE — Progress Notes (Signed)
Cardiology Office Note   Date:  04/10/2015   ID:  Daryn, Gosnell 1925-11-25, MRN [id number]  PCP:  Hollace Kinnier, DO  Cardiologist: [name] MD  Chief Complaint  Patient presents with  . Follow-up    hypertension      History of Present Illness: CARO GEBERT is a 82 y.o. female who presents for  Four-month follow-up visit   This pleasant 82 year old woman is seen for a follow-up office visit. The patient was hospitalized from October 16 through 12/27/2013. She was placed in the hospital because of initial concern about possible TIA. She was also having markedly labile blood pressure readings. She was seen in mid August 2015 because of recent elevation of her blood pressure. She has a history of essential hypertension. She has a past history of a TIAs and is on aspirin and Plavix. She has a history of Parkinson's disease and a history of Sjogren's syndrome. She does not have any known ischemic heart disease and she had a normal nuclear stress test in 2009. Marland Kitchen There was mild aortic insufficiency and mild mitral regurgitation. Her last chest x-ray in March 2014 showed borderline cardiomegaly She has stage III chronic kidney disease.  .  While hospitalized in October 2015 the patient developed acute respiratory distress while she was in the MRI machine. There was a question as to whether she might have aspirated. She was intubated and was on a ventilator for 14 hours. She improved quickly. Chest x-ray showed marked bilateral densities raising question of heart failure versus aspiration pneumonia. Since discharge she has been followed up by neurology. Her neurologist is Dr.Xu. Neurology appropriately requested a a vent monitor to rule out arrhythmia as a cause of her dizziness and previous TIA. The 30 day monitor did not show any events. She was monitored from 02/21/14 until 03/22/2014.  on 01/25/2015 the patient was seen in the emergency room with acute  uncontrollable diarrhea. She was evaluated and sent home.  On 11/21 patient returned to the emergency room because of a fall at home. On 12/ 7/ 16 she was admitted with profound weakness and was found to be in acute diastolic heart failure.  An echocardiogram on 02/13/15 showed ejection fraction of 65-70% with grade 1 diastolic dysfunction and mild aortic insufficiency.  She responded to IV Lasix and is now on Lasix at home.  Past Medical History  Diagnosis Date  . Hypertension   . Parkinson's disease   . Hyperlipidemia   . Dizziness   . Orthostatic hypotension   . Anemia   . Tremor   . Breast cancer ([place])     LEFT BREAST  . Kidney failure     stage 3  . Hypothyroidism   . Prediabetes   . TIA (transient ischemic attack)   . Hyperglycemia   . Left ventricular diastolic dysfunction   . Hypophonia     Past Surgical History  Procedure Laterality Date  . Cardiovascular stress test  06/2007    NORMAL  . Breast lumpectomy  2010  . Cholecystectomy  1994  . Tonsillectomy    . Nasal sinus surgery      submucous resection late 1950s  . Cataract extraction  2005,2006    Dr.Devonzo     Current Outpatient Prescriptions  Medication Sig Dispense Refill  . AMBULATORY NON FORMULARY MEDICATION Outpatient Speech Therapy, evaluate and treat.  DX: G20, R49.8 1 each 0  . aspirin 81 MG tablet Take 81 mg by mouth every evening.     Marland Kitchen  BIOTIN PO Take 1,000 mcg by mouth daily.     . Cholecalciferol (VITAMIN D3) 2000 UNITS TABS Take 1 tablet by mouth daily.     . clopidogrel (PLAVIX) 75 MG tablet Take 75 mg by mouth every evening.    . Cranberry 500 MG CAPS Take 500 mg by mouth daily.     Marland Kitchen docusate sodium (COLACE) 100 MG capsule Take 100 mg by mouth 2 (two) times daily.    . folic acid (FOLVITE) Q000111Q MCG tablet Take 800 mcg by mouth daily.     . furosemide (LASIX) 40 MG tablet Take 1 tablet (40 mg total) by mouth daily. 90 tablet 1  . gabapentin (NEURONTIN) 100 MG capsule Take 100 mg by mouth 3  (three) times daily as needed (pain).     . iron polysaccharides (NIFEREX) 150 MG capsule Take 150 mg by mouth every evening.    Marland Kitchen levothyroxine (SYNTHROID, LEVOTHROID) 150 MCG tablet Take 150 mcg by mouth daily before breakfast.    . metoprolol (LOPRESSOR) 100 MG tablet Take 1 tablet (100 mg total) by mouth 2 (two) times daily. 180 tablet 1  . Multiple Vitamin (MULTIVITAMIN WITH MINERALS) TABS tablet Take 1 tablet by mouth daily.    . ondansetron (ZOFRAN) 4 MG tablet Take 1 tablet (4 mg total) by mouth every 8 (eight) hours as needed for nausea or vomiting. 10 tablet 0  . pilocarpine (SALAGEN) 5 MG tablet Take 5 mg by mouth daily as needed (showgrens- dryness).   0  . Polyethyl Glycol-Propyl Glycol (SYSTANE OP) Apply 1 drop to eye 2 (two) times daily as needed (dryness).    . Probiotic Product (PROBIOTIC PO) Take 1 tablet by mouth daily.    . rasagiline (AZILECT) 1 MG TABS Take 1 mg by mouth daily.    . Rotigotine 8 MG/24HR PT24 Place 1 patch onto the skin every evening.     Marland Kitchen spironolactone (ALDACTONE) 25 MG tablet Take 1 tablet (25 mg total) by mouth daily. 90 tablet 1   No current facility-administered medications for this visit.    Allergies:   Aciphex; Amantadine; Amantadine hcl; Amantadines; Avapro; Carbidopa-levodopa; Cardura; Ciprofloxacin; Clonidine derivatives; Cozaar; Diltiazem; Famotidine; Hctz; Indapamide; Lansoprazole; Lisinopril; Loratadine; Maxidex; Mirapex; Norvasc; Pantoprazole sodium; Prilosec; Procardia; Proton pump inhibitors; Sulfa drugs cross reactors; Trimethoprim; Zyrtec; Metronidazole; and Penicillins    Social History:  The patient  reports that she quit smoking about 51 years ago. Her smoking use included Cigarettes. She has a 6 pack-year smoking history. She has never used smokeless tobacco. She reports that she does not drink alcohol or use illicit drugs.   Family History:  The patient's family history includes COPD in her sister; Cancer in her brother; Diabetes in  her father; Heart disease in her mother; Heart failure in her father; Osteoporosis in her daughter; Parkinsonism in her father; Stroke in her sister.    ROS:  Please see the history of present illness.   Otherwise, review of systems are positive for none.   All other systems are reviewed and negative.    PHYSICAL EXAM: VS:  BP 144/82 mmHg  Pulse 78  Ht 5\' 1"  (1.549 m)  Wt 115 lb 12.8 oz (52.527 kg)  BMI 21.89 kg/m2 , BMI Body mass index is 21.89 kg/(m^2). GEN: Well nourished, well developed, in no acute distress HEENT: normal Neck: no JVD, carotid bruits, or masses Cardiac: RRR; no murmurs, rubs, or gallops,no edema  Respiratory:  clear to auscultation bilaterally, normal work of breathing GI: soft,  nontender, nondistended, + BS MS: no deformity or atrophy Skin: warm and dry, no rash Neuro:  Strength and sensation are intact Psych: euthymic mood, full affect   EKG:  EKG is ordered today. The ekg ordered today demonstrates  I will sinus rhythm with ST-T wave changes consistent with lateral ischemia. Prolonged QT interval. Since prior tracing of 02/12/15, no significant change   Recent Labs: 01/08/2015: TSH 9.960* 01/25/2015: ALT 78* 02/12/2015: B Natriuretic Peptide 1814.9* 02/14/2015: BUN 30*; Creatinine, Ser 0.95; Potassium 4.1; Sodium 136 03/17/2015: Hemoglobin 11.5*; Platelets 127*    Lipid Panel    Component Value Date/Time   CHOL 112 01/08/2015 0806   CHOL 99 12/22/2013 0239   TRIG 89 01/08/2015 0806   HDL 39* 01/08/2015 0806   HDL 41 12/22/2013 0239   CHOLHDL 2.9 01/08/2015 0806   CHOLHDL 2.4 12/22/2013 0239   VLDL 27 12/22/2013 0239   LDLCALC 55 01/08/2015 0806   LDLCALC 31 12/22/2013 0239   LDLDIRECT 65.1 12/27/2011 1001      Wt Readings from Last 3 Encounters:  04/10/15 115 lb 12.8 oz (52.527 kg)  03/24/15 112 lb (50.803 kg)  02/14/15 107 lb 4.8 oz (48.671 kg)        ASSESSMENT AND PLAN:  1. Essential hypertension with hyperdynamic left ventricular  ejection fraction 75-80% prior to increasing beta blocker dose. Ejection fraction on beta blocker now in the 65-70% range 2. Dyspnea, Secondary to diastolic dysfunction, improved 3. past history of TIAs on dual antiplatelet therapy 4. Parkinsonism followed by   neurology in [facility]. 5. recent compression fracture from a fall in July 20 15th 6. chronic kidney disease stage III 7. Left ventricular diastolic dysfunction by echo 8. Dizzy spells 9. Hypothyroid followed by Dr. Mariea Clonts   Current medicines are reviewed at length with the patient today.  The patient does not have concerns regarding medicines.  The following changes have been made:  no change  Labs/ tests ordered today include:   Orders Placed This Encounter  Procedures  . EKG 12-Lead     disposition: continue current medication. Follow-up with Dr. Marlou Porch in 4 months.  Berna Spare MD 04/10/2015 5:42 PM    [facility] [place], [place],   [address] Phone: [phone]; Fax: [phone]

## 2015-04-11 DIAGNOSIS — I5033 Acute on chronic diastolic (congestive) heart failure: Secondary | ICD-10-CM | POA: Diagnosis not present

## 2015-04-11 DIAGNOSIS — G20 Parkinson's disease: Secondary | ICD-10-CM | POA: Diagnosis not present

## 2015-04-11 DIAGNOSIS — M6281 Muscle weakness (generalized): Secondary | ICD-10-CM | POA: Diagnosis not present

## 2015-04-11 DIAGNOSIS — R2681 Unsteadiness on feet: Secondary | ICD-10-CM | POA: Diagnosis not present

## 2015-04-14 DIAGNOSIS — G20 Parkinson's disease: Secondary | ICD-10-CM | POA: Diagnosis not present

## 2015-04-14 DIAGNOSIS — R2681 Unsteadiness on feet: Secondary | ICD-10-CM | POA: Diagnosis not present

## 2015-04-14 DIAGNOSIS — I5033 Acute on chronic diastolic (congestive) heart failure: Secondary | ICD-10-CM | POA: Diagnosis not present

## 2015-04-14 DIAGNOSIS — M6281 Muscle weakness (generalized): Secondary | ICD-10-CM | POA: Diagnosis not present

## 2015-04-15 DIAGNOSIS — I5033 Acute on chronic diastolic (congestive) heart failure: Secondary | ICD-10-CM | POA: Diagnosis not present

## 2015-04-15 DIAGNOSIS — R2681 Unsteadiness on feet: Secondary | ICD-10-CM | POA: Diagnosis not present

## 2015-04-15 DIAGNOSIS — G20 Parkinson's disease: Secondary | ICD-10-CM | POA: Diagnosis not present

## 2015-04-15 DIAGNOSIS — M6281 Muscle weakness (generalized): Secondary | ICD-10-CM | POA: Diagnosis not present

## 2015-04-16 ENCOUNTER — Other Ambulatory Visit: Payer: Self-pay | Admitting: Internal Medicine

## 2015-04-16 DIAGNOSIS — I5033 Acute on chronic diastolic (congestive) heart failure: Secondary | ICD-10-CM | POA: Diagnosis not present

## 2015-04-16 DIAGNOSIS — G20 Parkinson's disease: Secondary | ICD-10-CM | POA: Diagnosis not present

## 2015-04-16 DIAGNOSIS — R2681 Unsteadiness on feet: Secondary | ICD-10-CM | POA: Diagnosis not present

## 2015-04-16 DIAGNOSIS — M6281 Muscle weakness (generalized): Secondary | ICD-10-CM | POA: Diagnosis not present

## 2015-04-17 ENCOUNTER — Ambulatory Visit (INDEPENDENT_AMBULATORY_CARE_PROVIDER_SITE_OTHER): Payer: Medicare Other | Admitting: Podiatry

## 2015-04-17 ENCOUNTER — Encounter: Payer: Self-pay | Admitting: Podiatry

## 2015-04-17 VITALS — BP 155/71 | HR 80

## 2015-04-17 DIAGNOSIS — G20 Parkinson's disease: Secondary | ICD-10-CM | POA: Diagnosis not present

## 2015-04-17 DIAGNOSIS — B351 Tinea unguium: Secondary | ICD-10-CM | POA: Diagnosis not present

## 2015-04-17 DIAGNOSIS — M79604 Pain in right leg: Secondary | ICD-10-CM

## 2015-04-17 DIAGNOSIS — M79606 Pain in leg, unspecified: Secondary | ICD-10-CM | POA: Insufficient documentation

## 2015-04-17 NOTE — Progress Notes (Signed)
SUBJECTIVE: 82 y.o. year old female presents aided by walker and accompanied by her husband requesting toe nails trimmed.  She noted right big toe nail has turned to dark brown and hurts to walk.  She has Parkinson's and having problem getting her nails trimmed.   REVIEW OF SYSTEMS: Positive of Parkinson's disease, COPD.   OBJECTIVE: DERMATOLOGIC EXAMINATION: Hypertrophic nails x 10. Traumatized nail with subungual hematoma right hallucal nail.  No associated edema or erythema noted. Thin shiny skin both feet. VASCULAR EXAMINATION OF LOWER LIMBS: Pedal pulses: Left foot pedal pulses are palpable with normal pulsation. Right foot is not palpable. NEUROLOGIC EXAMINATION OF THE LOWER LIMBS: All epicritic and tactile sensations grossly intact.  MUSCULOSKELETAL EXAMINATION: No gross deformities noted.  ASSESSMENT: Onychomycosis x 10. Deformed and injured right great toe nail with subungual hematoma, dry without associated cellulitis.   PLAN: All nails debrided. Return in 3 months or as needed

## 2015-04-17 NOTE — Patient Instructions (Signed)
Seen for hypertrophic nails. All nails debrided. Return in 3 months or as needed.  

## 2015-04-18 ENCOUNTER — Ambulatory Visit: Payer: Medicare Other | Admitting: Internal Medicine

## 2015-04-18 DIAGNOSIS — M6281 Muscle weakness (generalized): Secondary | ICD-10-CM | POA: Diagnosis not present

## 2015-04-18 DIAGNOSIS — G20 Parkinson's disease: Secondary | ICD-10-CM | POA: Diagnosis not present

## 2015-04-18 DIAGNOSIS — I5033 Acute on chronic diastolic (congestive) heart failure: Secondary | ICD-10-CM | POA: Diagnosis not present

## 2015-04-18 DIAGNOSIS — R2681 Unsteadiness on feet: Secondary | ICD-10-CM | POA: Diagnosis not present

## 2015-04-21 ENCOUNTER — Other Ambulatory Visit: Payer: Medicare Other

## 2015-04-21 DIAGNOSIS — B001 Herpesviral vesicular dermatitis: Secondary | ICD-10-CM

## 2015-04-21 DIAGNOSIS — R7401 Elevation of levels of liver transaminase levels: Secondary | ICD-10-CM

## 2015-04-21 DIAGNOSIS — R2681 Unsteadiness on feet: Secondary | ICD-10-CM | POA: Diagnosis not present

## 2015-04-21 DIAGNOSIS — I110 Hypertensive heart disease with heart failure: Secondary | ICD-10-CM | POA: Diagnosis not present

## 2015-04-21 DIAGNOSIS — E785 Hyperlipidemia, unspecified: Secondary | ICD-10-CM

## 2015-04-21 DIAGNOSIS — I5033 Acute on chronic diastolic (congestive) heart failure: Secondary | ICD-10-CM | POA: Diagnosis not present

## 2015-04-21 DIAGNOSIS — E034 Atrophy of thyroid (acquired): Secondary | ICD-10-CM

## 2015-04-21 DIAGNOSIS — M6281 Muscle weakness (generalized): Secondary | ICD-10-CM | POA: Diagnosis not present

## 2015-04-21 DIAGNOSIS — G20 Parkinson's disease: Secondary | ICD-10-CM | POA: Diagnosis not present

## 2015-04-21 DIAGNOSIS — E038 Other specified hypothyroidism: Secondary | ICD-10-CM | POA: Diagnosis not present

## 2015-04-21 DIAGNOSIS — R740 Nonspecific elevation of levels of transaminase and lactic acid dehydrogenase [LDH]: Secondary | ICD-10-CM | POA: Diagnosis not present

## 2015-04-22 DIAGNOSIS — I5033 Acute on chronic diastolic (congestive) heart failure: Secondary | ICD-10-CM | POA: Diagnosis not present

## 2015-04-22 DIAGNOSIS — G20 Parkinson's disease: Secondary | ICD-10-CM | POA: Diagnosis not present

## 2015-04-22 DIAGNOSIS — M6281 Muscle weakness (generalized): Secondary | ICD-10-CM | POA: Diagnosis not present

## 2015-04-22 DIAGNOSIS — R2681 Unsteadiness on feet: Secondary | ICD-10-CM | POA: Diagnosis not present

## 2015-04-22 LAB — BASIC METABOLIC PANEL
BUN/Creatinine Ratio: 33 — ABNORMAL HIGH (ref 11–26)
BUN: 36 mg/dL — ABNORMAL HIGH (ref 8–27)
CO2: 25 mmol/L (ref 18–29)
Calcium: 9.3 mg/dL (ref 8.7–10.3)
Chloride: 96 mmol/L (ref 96–106)
Creatinine, Ser: 1.1 mg/dL — ABNORMAL HIGH (ref 0.57–1.00)
GFR calc Af Amer: 52 mL/min/{1.73_m2} — ABNORMAL LOW (ref >59)
GFR calc non Af Amer: 45 mL/min/{1.73_m2} — ABNORMAL LOW (ref >59)
Glucose: 98 mg/dL (ref 65–99)
Potassium: 4.8 mmol/L (ref 3.5–5.2)
Sodium: 139 mmol/L (ref 134–144)

## 2015-04-22 LAB — CBC WITH DIFFERENTIAL/PLATELET
Basophils Absolute: 0 10*3/uL (ref 0.0–0.2)
Basos: 0 %
EOS (ABSOLUTE): 0.3 10*3/uL (ref 0.0–0.4)
Eos: 5 %
Hematocrit: 36.9 % (ref 34.0–46.6)
Hemoglobin: 11.8 g/dL (ref 11.1–15.9)
Immature Grans (Abs): 0 10*3/uL (ref 0.0–0.1)
Immature Granulocytes: 0 %
Lymphocytes Absolute: 1.7 10*3/uL (ref 0.7–3.1)
Lymphs: 25 %
MCH: 30.8 pg (ref 26.6–33.0)
MCHC: 32 g/dL (ref 31.5–35.7)
MCV: 96 fL (ref 79–97)
Monocytes Absolute: 1 10*3/uL — ABNORMAL HIGH (ref 0.1–0.9)
Monocytes: 15 %
Neutrophils Absolute: 3.7 10*3/uL (ref 1.4–7.0)
Neutrophils: 55 %
Platelets: 148 10*3/uL — ABNORMAL LOW (ref 150–379)
RBC: 3.83 x10E6/uL (ref 3.77–5.28)
RDW: 16.9 % — ABNORMAL HIGH (ref 12.3–15.4)
WBC: 6.7 10*3/uL (ref 3.4–10.8)

## 2015-04-22 LAB — HEPATIC FUNCTION PANEL
ALT: 36 IU/L — ABNORMAL HIGH (ref 0–32)
AST: 44 IU/L — ABNORMAL HIGH (ref 0–40)
Albumin: 3.9 g/dL (ref 3.5–4.7)
Alkaline Phosphatase: 166 IU/L — ABNORMAL HIGH (ref 39–117)
Bilirubin Total: 0.4 mg/dL (ref 0.0–1.2)
Bilirubin, Direct: 0.15 mg/dL (ref 0.00–0.40)
Total Protein: 7.1 g/dL (ref 6.0–8.5)

## 2015-04-22 LAB — TSH: TSH: 9.86 u[IU]/mL — ABNORMAL HIGH (ref 0.450–4.500)

## 2015-04-23 DIAGNOSIS — I5033 Acute on chronic diastolic (congestive) heart failure: Secondary | ICD-10-CM | POA: Diagnosis not present

## 2015-04-23 DIAGNOSIS — M6281 Muscle weakness (generalized): Secondary | ICD-10-CM | POA: Diagnosis not present

## 2015-04-23 DIAGNOSIS — G20 Parkinson's disease: Secondary | ICD-10-CM | POA: Diagnosis not present

## 2015-04-23 DIAGNOSIS — R2681 Unsteadiness on feet: Secondary | ICD-10-CM | POA: Diagnosis not present

## 2015-04-24 ENCOUNTER — Encounter: Payer: Self-pay | Admitting: Internal Medicine

## 2015-04-24 ENCOUNTER — Ambulatory Visit (INDEPENDENT_AMBULATORY_CARE_PROVIDER_SITE_OTHER): Payer: Medicare Other | Admitting: Internal Medicine

## 2015-04-24 VITALS — BP 128/79 | HR 77 | Temp 97.4°F | Resp 17 | Ht 61.0 in | Wt 114.2 lb

## 2015-04-24 DIAGNOSIS — H538 Other visual disturbances: Secondary | ICD-10-CM

## 2015-04-24 DIAGNOSIS — K760 Fatty (change of) liver, not elsewhere classified: Secondary | ICD-10-CM | POA: Diagnosis not present

## 2015-04-24 DIAGNOSIS — G20 Parkinson's disease: Secondary | ICD-10-CM | POA: Diagnosis not present

## 2015-04-24 DIAGNOSIS — E785 Hyperlipidemia, unspecified: Secondary | ICD-10-CM

## 2015-04-24 DIAGNOSIS — E039 Hypothyroidism, unspecified: Secondary | ICD-10-CM | POA: Diagnosis not present

## 2015-04-24 DIAGNOSIS — I110 Hypertensive heart disease with heart failure: Secondary | ICD-10-CM

## 2015-04-24 DIAGNOSIS — T148 Other injury of unspecified body region: Secondary | ICD-10-CM | POA: Diagnosis not present

## 2015-04-24 DIAGNOSIS — T148XXA Other injury of unspecified body region, initial encounter: Secondary | ICD-10-CM

## 2015-04-24 DIAGNOSIS — R5381 Other malaise: Secondary | ICD-10-CM

## 2015-04-24 DIAGNOSIS — G20A1 Parkinson's disease without dyskinesia, without mention of fluctuations: Secondary | ICD-10-CM

## 2015-04-24 MED ORDER — LEVOTHYROXINE SODIUM 175 MCG PO TABS: 175.0000 ug | ORAL_TABLET | Freq: Every day | ORAL | Status: DC

## 2015-04-24 NOTE — Progress Notes (Signed)
Patient ID: Victoria Lindsey, female   DOB: 1926/02/10, 82 y.o.   MRN: [id number]   Location: [place] Provider: Rexene Edison. Mariea Clonts, D.O., C.M.D.  Code Status: DNR Goals of Care: Advanced Directive information Does patient have an advance directive?: Yes, Type of Advance Directive: [place];Living will  Chief Complaint  Patient presents with  . Medical Management of Chronic Issues    1 month followup (labs printed)  . OTHER    Sore on lip and spot on back that itches badly.  . OTHER    Discuss labs that were taken on Monday.     HPI: Patient is a 82 y.o. female seen in the office today for med mgt of chronic diseases.   Transaminitis:  Prior US in 12/24/13 showed fatty liver.  Being off crestor did not help.  CHF:  Saw Dr. Mare Ferrari since she's been here.  Was on fluid restriction at hospital and snf, but has not really been drinking more since.  Asks if they can drink more (she does not drink much at all).  She had 16oz this am, then 8oz the next time of water.    Hypothyroidism:  Had generic levothyroxine hospital through SNF.  Was on 151mcg of brand since coming home.  TSH still 9.    Sore on lip--not painful like fever blister.  Bites her lips a lot.  Has dry mouth.  Spot on back itches.  Feels dry.  Not internal neuropathic itch.  Vision blurry at times when watching TV.  Comes and goes.  No pain in eyes.    Parkinson's:  Still seeing PT, OT at home.  Her daughter would like her to resume outpatient neurorehab so she does not lose ground again.  Uses her walker.  Review of Systems:  Review of Systems  Constitutional: Negative for fever, chills and malaise/fatigue.  HENT: Negative for congestion.   Eyes: Positive for blurred vision.       Glasses; blurred off and on  Respiratory: Negative for shortness of breath.   Cardiovascular: Negative for chest pain and leg swelling.  Gastrointestinal: Positive for constipation. Negative for abdominal  pain, blood in stool and melena.       Hemorrhoids  Genitourinary: Positive for urgency. Negative for dysuria.  Musculoskeletal: Negative for falls.  Skin: Positive for itching and rash.       Of back; right lower lip purple papule inner lip; dry skin of entire back with especially dry patch on right thoracic area  Neurological: Positive for tremors.       Shuffling gait  Psychiatric/Behavioral: Positive for memory loss.    Past Medical History  Diagnosis Date  . Hypertension   . Parkinson's disease   . Hyperlipidemia   . Dizziness   . Orthostatic hypotension   . Anemia   . Tremor   . Breast cancer ([place])     LEFT BREAST  . Kidney failure     stage 3  . Hypothyroidism   . Prediabetes   . TIA (transient ischemic attack)   . Hyperglycemia   . Left ventricular diastolic dysfunction   . Hypophonia     Past Surgical History  Procedure Laterality Date  . Cardiovascular stress test  06/2007    NORMAL  . Breast lumpectomy  2010  . Cholecystectomy  1994  . Tonsillectomy    . Nasal sinus surgery      submucous resection late 1950s  . Cataract extraction  [id number]  Dr.Devonzo    Allergies  Allergen Reactions  . Aciphex [Rabeprazole Sodium]     unknown  . Amantadine Other (See Comments)    Pt cannot remember  . Amantadine Hcl     unknown  . Amantadines     unknown  . Avapro [Irbesartan]     unknown  . Carbidopa-Levodopa     unknown  . Cardura [Doxazosin Mesylate]     unknown  . Ciprofloxacin     unknown  . Clonidine Derivatives     unknown  . Cozaar     nightmares  . Diltiazem     unknown  . Famotidine     unknown  . Hctz [Hydrochlorothiazide]     unknown  . Indapamide     unknown  . Lansoprazole     unknown  . Lisinopril     Mouth problems  . Loratadine     unknown  . Maxidex [Dexamethasone]     unknown  . Mirapex [Pramipexole Dihydrochloride]     unknown  . Norvasc [Amlodipine Besylate]     unknown  . Pantoprazole Sodium     unknown  .  Prilosec [Omeprazole]     unknown  . Procardia [Nifedipine]     unknown  . Proton Pump Inhibitors     unknown  . Sulfa Drugs Cross Reactors     unknown  . Trimethoprim     unknown  . Zyrtec [Cetirizine Hcl]     unknown  . Metronidazole Rash  . Penicillins Rash    Has patient had a PCN reaction causing immediate rash, facial/tongue/throat swelling, SOB or lightheadedness with hypotension: just a rash all over Has patient had a PCN reaction causing severe rash involving mucus membranes or skin necrosis: no Has patient had a PCN reaction that required hospitalization-no Has patient had a PCN reaction occurring within the last 10 years: no, more than 50 yrs ago If all of the above answers are "NO", then may proceed with Cephalosporin use.       Medication List       This list is accurate as of: 04/24/15  2:59 PM.  Always use your most recent med list.               AMBULATORY NON FORMULARY MEDICATION  Outpatient Speech Therapy, evaluate and treat.  DX: G20, R49.8     aspirin 81 MG tablet  Take 81 mg by mouth every evening.     AZILECT 1 MG Tabs tablet  Generic drug:  rasagiline  Take 1 mg by mouth daily.     BIOTIN PO  Take 1,000 mcg by mouth daily.     clopidogrel 75 MG tablet  Commonly known as:  PLAVIX  Take 75 mg by mouth every evening.     Cranberry 500 MG Caps  Take 500 mg by mouth daily.     docusate sodium 100 MG capsule  Commonly known as:  COLACE  Take 100 mg by mouth 2 (two) times daily.     folic acid [id number] MCG tablet  Commonly known as:  FOLVITE  Take 800 mcg by mouth daily.     furosemide 40 MG tablet  Commonly known as:  LASIX  Take 1 tablet (40 mg total) by mouth daily.     gabapentin 100 MG capsule  Commonly known as:  NEURONTIN  Take 100 mg by mouth 3 (three) times daily as needed (pain).     iron polysaccharides 150 MG capsule  Commonly known  as:  NIFEREX  Take 150 mg by mouth every evening.     metoprolol 100 MG tablet  Commonly  known as:  LOPRESSOR  Take 1 tablet (100 mg total) by mouth 2 (two) times daily.     multivitamin with minerals Tabs tablet  Take 1 tablet by mouth daily.     ondansetron 4 MG tablet  Commonly known as:  ZOFRAN  Take 1 tablet (4 mg total) by mouth every 8 (eight) hours as needed for nausea or vomiting.     pilocarpine 5 MG tablet  Commonly known as:  SALAGEN  Take 5 mg by mouth daily as needed (showgrens- dryness).     PROBIOTIC PO  Take 1 tablet by mouth daily.     Rotigotine 8 MG/24HR Pt24  Place 1 patch onto the skin every evening.     spironolactone 25 MG tablet  Commonly known as:  ALDACTONE  Take 1 tablet (25 mg total) by mouth daily.     SYNTHROID 150 MCG tablet  Generic drug:  levothyroxine  TAKE 1 TABLET BY MOUTH DAILY BEFORE BREAKFAST     SYSTANE OP  Apply 1 drop to eye 2 (two) times daily as needed (dryness).     Vitamin D3 2000 units Tabs  Take 1 tablet by mouth daily.        Health Maintenance  Topic Date Due  . OPHTHALMOLOGY EXAM  02/04/1936  . PNA vac Low Risk Adult (2 of 2 - PCV13) 05/24/2014  . MAMMOGRAM  03/08/2048 (Originally 03/08/2012)  . HEMOGLOBIN A1C  07/08/2015  . INFLUENZA VACCINE  10/07/2015  . FOOT EXAM  10/08/2015  . URINE MICROALBUMIN  10/11/2015  . TETANUS/TDAP  02/08/2022  . DEXA SCAN  Completed  . ZOSTAVAX  Completed    Physical Exam: Filed Vitals:   04/24/15 1443  BP: 128/79  Pulse: 77  Temp: 97.4 F (36.3 C)  TempSrc: Oral  Resp: 17  Height: 5\' 1"  (1.549 m)  Weight: 114 lb 3.2 oz (51.801 kg)  SpO2: 98%   Body mass index is 21.59 kg/(m^2). Physical Exam  Constitutional: She is oriented to person, place, and time. She appears well-developed and well-nourished. No distress.  Eyes: Conjunctivae and EOM are normal. Pupils are equal, round, and reactive to light.  Wearing glasses  Cardiovascular: Normal rate, regular rhythm and normal heart sounds.   Pulmonary/Chest: Effort normal and breath sounds normal. No respiratory  distress.  Abdominal: Soft. Bowel sounds are normal.  Musculoskeletal: Normal range of motion.  Shuffling gait with rollator walker  Neurological: She is alert and oriented to person, place, and time.  Skin: There is pallor.  Purple papule inside bottom lip; excoriated patch of dry skin on right thoracic area posteriorly    Labs reviewed: Basic Metabolic Panel:  Recent Labs  10/08/14 0832  01/08/15 0806  02/13/15 0418 02/14/15 0258 04/21/15 0929  NA 142  < > 139  < > 136 136 139  K 4.7  < > 4.7  < > 4.5 4.1 4.8  CL 100  < > 99  < > 96* 97* 96  CO2 23  < > 25  < > 33* 35* 25  GLUCOSE 92  < > 102*  < > 88 112* 98  BUN 34*  < > 29*  < > 32* 30* 36*  CREATININE 1.12*  < > 0.92  < > 1.11* 0.95 1.10*  CALCIUM 8.9  < > 9.1  < > 8.7* 8.5* 9.3  TSH 5.220*  --  9.960*  --   --   --  9.860*  < > = values in this interval not displayed. Liver Function Tests:  Recent Labs  01/08/15 0806 01/25/15 1151 04/21/15 0929  AST 46* 75* 44*  ALT 46* 78* 36*  ALKPHOS 154* 176* 166*  BILITOT 0.3 0.4 0.4  PROT 6.8 8.4* 7.1  ALBUMIN 3.8 4.2 3.9    Recent Labs  01/25/15 1151  LIPASE 33   No results for input(s): AMMONIA in the last 8760 hours. CBC:  Recent Labs  01/08/15 0806  02/12/15 1237 02/13/15 0418 03/17/15 04/21/15 0929  WBC 6.3  < > 4.5 5.9 6.4 6.7  NEUTROABS 3.7  --   --  3.4  --  3.7  HGB  --   < > 11.0* 10.0* 11.5*  --   HCT 32.8*  < > 35.4* 32.7* 36 36.9  MCV 93  < > 95.2 97.0  --  96  PLT 123*  < > 128* 127* 127* 148*  < > = values in this interval not displayed. Lipid Panel:  Recent Labs  10/08/14 0832 01/08/15 0806  CHOL 106 112  HDL 41 39*  LDLCALC 49 55  TRIG 81 89  CHOLHDL 2.6 2.9   Lab Results  Component Value Date   HGBA1C 7.0* 01/08/2015     Assessment/Plan 1. Hypothyroidism, unspecified hypothyroidism type - due to high tsh, increase synthroid dose to 170mcg--takes BRAND only - levothyroxine (SYNTHROID) 175 MCG tablet; Take 1 tablet (175  mcg total) by mouth daily before breakfast.  Dispense: 30 tablet; Refill: 3 - T4, Free; Future - TSH; Future  2. Fatty liver - seems to be cause of her transaminitis - had prior chole and bile ducts were normal on a prior US  3. Idiopathic Parkinson's disease ([place]) -cont therapy at home, use of rollator, current meds and monitor  4. Hypertensive heart disease with CHF (congestive heart failure) (HCC) -stable, no further events since acute diastolic chf at hospital after hydration for diarrhea  5. HLD (hyperlipidemia) -discontinued crestor due to transaminitis, but it did not improve -due to age, frailty, will keep off statin at this point  6. Physical debility -ongoing -continues pt, ot at home and improving gradually -her daughter would like her to then move to outpatient rehab at [facility] if possible or at the neurorehab at [facility]  7. Blood blister -on right lower inner lip--should resolve spontaneously  8. Blurred vision, bilateral -advised to f/u with eye doctor -reports that sometimes she cannot see the characters when looking at TV--?macular degeneration  Labs/tests ordered:   Orders Placed This Encounter  Procedures  . T4, Free    Standing Status: Future     Number of Occurrences:      Standing Expiration Date: 07/22/2015  . TSH    Standing Status: Future     Number of Occurrences:      Standing Expiration Date: 07/22/2015    Next appt:  07/24/2015 med mgt, but labs in 6 wks for thyroid   Aven Cegielski L. Meral Geissinger, D.O. [facility] [address], [place] [address] Cell Phone (Mon-Fri 8am-5pm):  [phone] On Call:  [phone] & follow prompts after 5pm & weekends Office Phone:  [phone] Office Fax:  [phone]

## 2015-04-25 ENCOUNTER — Ambulatory Visit: Payer: Medicare Other | Admitting: Internal Medicine

## 2015-04-25 DIAGNOSIS — M6281 Muscle weakness (generalized): Secondary | ICD-10-CM | POA: Diagnosis not present

## 2015-04-25 DIAGNOSIS — I5033 Acute on chronic diastolic (congestive) heart failure: Secondary | ICD-10-CM | POA: Diagnosis not present

## 2015-04-25 DIAGNOSIS — G20 Parkinson's disease: Secondary | ICD-10-CM | POA: Diagnosis not present

## 2015-04-25 DIAGNOSIS — R2681 Unsteadiness on feet: Secondary | ICD-10-CM | POA: Diagnosis not present

## 2015-04-28 DIAGNOSIS — R2681 Unsteadiness on feet: Secondary | ICD-10-CM | POA: Diagnosis not present

## 2015-04-28 DIAGNOSIS — I5033 Acute on chronic diastolic (congestive) heart failure: Secondary | ICD-10-CM | POA: Diagnosis not present

## 2015-04-28 DIAGNOSIS — M6281 Muscle weakness (generalized): Secondary | ICD-10-CM | POA: Diagnosis not present

## 2015-04-28 DIAGNOSIS — G20 Parkinson's disease: Secondary | ICD-10-CM | POA: Diagnosis not present

## 2015-04-29 DIAGNOSIS — R2681 Unsteadiness on feet: Secondary | ICD-10-CM | POA: Diagnosis not present

## 2015-04-29 DIAGNOSIS — G20 Parkinson's disease: Secondary | ICD-10-CM | POA: Diagnosis not present

## 2015-04-29 DIAGNOSIS — M6281 Muscle weakness (generalized): Secondary | ICD-10-CM | POA: Diagnosis not present

## 2015-04-29 DIAGNOSIS — I5033 Acute on chronic diastolic (congestive) heart failure: Secondary | ICD-10-CM | POA: Diagnosis not present

## 2015-04-30 ENCOUNTER — Telehealth: Payer: Self-pay | Admitting: *Deleted

## 2015-04-30 DIAGNOSIS — G20 Parkinson's disease: Secondary | ICD-10-CM | POA: Diagnosis not present

## 2015-04-30 DIAGNOSIS — R2681 Unsteadiness on feet: Secondary | ICD-10-CM | POA: Diagnosis not present

## 2015-04-30 DIAGNOSIS — I5033 Acute on chronic diastolic (congestive) heart failure: Secondary | ICD-10-CM | POA: Diagnosis not present

## 2015-04-30 DIAGNOSIS — M6281 Muscle weakness (generalized): Secondary | ICD-10-CM | POA: Diagnosis not present

## 2015-04-30 NOTE — Telephone Encounter (Signed)
[name], daughter called and left message stating that patient has a sore on Left ankle/foot area and it is sore, red and inflammed and patient is Diabetic and wanting appointment for Dr. Mariea Clonts to look at it. I tried calling her back and left message for her to return call and schedule an appointment with receptionist and that we had some availability for tomorrow.

## 2015-05-02 DIAGNOSIS — R2681 Unsteadiness on feet: Secondary | ICD-10-CM | POA: Diagnosis not present

## 2015-05-02 DIAGNOSIS — G20 Parkinson's disease: Secondary | ICD-10-CM | POA: Diagnosis not present

## 2015-05-02 DIAGNOSIS — M6281 Muscle weakness (generalized): Secondary | ICD-10-CM | POA: Diagnosis not present

## 2015-05-02 DIAGNOSIS — I5033 Acute on chronic diastolic (congestive) heart failure: Secondary | ICD-10-CM | POA: Diagnosis not present

## 2015-05-05 DIAGNOSIS — M3501 Sicca syndrome with keratoconjunctivitis: Secondary | ICD-10-CM | POA: Diagnosis not present

## 2015-05-05 DIAGNOSIS — G20 Parkinson's disease: Secondary | ICD-10-CM | POA: Diagnosis not present

## 2015-05-05 DIAGNOSIS — H16213 Exposure keratoconjunctivitis, bilateral: Secondary | ICD-10-CM | POA: Diagnosis not present

## 2015-05-05 DIAGNOSIS — I5033 Acute on chronic diastolic (congestive) heart failure: Secondary | ICD-10-CM | POA: Diagnosis not present

## 2015-05-05 DIAGNOSIS — E119 Type 2 diabetes mellitus without complications: Secondary | ICD-10-CM | POA: Diagnosis not present

## 2015-05-05 DIAGNOSIS — R2681 Unsteadiness on feet: Secondary | ICD-10-CM | POA: Diagnosis not present

## 2015-05-05 DIAGNOSIS — M6281 Muscle weakness (generalized): Secondary | ICD-10-CM | POA: Diagnosis not present

## 2015-05-05 DIAGNOSIS — H5203 Hypermetropia, bilateral: Secondary | ICD-10-CM | POA: Diagnosis not present

## 2015-05-05 DIAGNOSIS — H04123 Dry eye syndrome of bilateral lacrimal glands: Secondary | ICD-10-CM | POA: Diagnosis not present

## 2015-05-09 ENCOUNTER — Ambulatory Visit (INDEPENDENT_AMBULATORY_CARE_PROVIDER_SITE_OTHER): Payer: Medicare Other | Admitting: Internal Medicine

## 2015-05-09 ENCOUNTER — Encounter: Payer: Self-pay | Admitting: Internal Medicine

## 2015-05-09 ENCOUNTER — Other Ambulatory Visit: Payer: Self-pay | Admitting: Internal Medicine

## 2015-05-09 VITALS — BP 140/82 | HR 74 | Temp 97.7°F | Ht 61.0 in | Wt 114.0 lb

## 2015-05-09 DIAGNOSIS — I5033 Acute on chronic diastolic (congestive) heart failure: Secondary | ICD-10-CM | POA: Diagnosis not present

## 2015-05-09 DIAGNOSIS — L89520 Pressure ulcer of left ankle, unstageable: Secondary | ICD-10-CM

## 2015-05-09 DIAGNOSIS — R2681 Unsteadiness on feet: Secondary | ICD-10-CM | POA: Diagnosis not present

## 2015-05-09 DIAGNOSIS — I7300 Raynaud's syndrome without gangrene: Secondary | ICD-10-CM

## 2015-05-09 DIAGNOSIS — I872 Venous insufficiency (chronic) (peripheral): Secondary | ICD-10-CM | POA: Diagnosis not present

## 2015-05-09 DIAGNOSIS — G20 Parkinson's disease: Secondary | ICD-10-CM | POA: Diagnosis not present

## 2015-05-09 DIAGNOSIS — M6281 Muscle weakness (generalized): Secondary | ICD-10-CM | POA: Diagnosis not present

## 2015-05-09 NOTE — Patient Instructions (Addendum)
Use moisturizer daily Duoderm to the ulcer--change every two days  Pressure Injury A pressure injury, sometimes called a bedsore, is an injury to the skin and underlying tissue caused by pressure. Pressure on blood vessels causes decreased blood flow to the skin, which can eventually cause the skin tissue to die and break down into a wound. Pressure injuries usually occur:  Over bony parts of the body such as the tailbone, shoulders, elbows, hips, and heels.  Under medical devices such as respiratory equipment, stockings, tubes, and splints. Pressure injuries start as reddened areas on the skin and can lead to pain, muscle damage, and infection. Pressure injuries can vary in severity.  CAUSES Pressure injuries are caused by a lack of blood supply to an area of skin. They can occur from intense pressure over a short period of time or from less intense pressure over a long period of time. RISK FACTORS This condition is more likely to develop in people who:  Are in the hospital or an extended care facility.  Are bedridden or in a wheelchair.  Have an injury or disease that keeps them from:  Moving normally.  Feeling pain or pressure.  Have a condition that:  Makes them sleepy or less alert.  Causes poor blood flow.  Need to wear a medical device.  Have poor control of their bladder or bowel functions (incontinence).  Have poor nutrition (malnutrition).  Are of certain ethnicities. People of African American and Latino or Hispanic descent are at higher risk compared to other ethnic groups. If you are at risk for pressure ulcers, your health care provider may recommend certain types of bedding to help prevent them. These may include foam or gel mattresses covered with one of the following:  A sheepskin blanket.  A pad that is filled with gel, air, water, or foam. SYMPTOMS  The main symptom is a blister or change in skin color that opens into a wound. Other symptoms include:     Red or dark areas of skin that do not turn white or pale when pressed with a finger.   Pain, warmth, or change of skin texture.  DIAGNOSIS This condition is diagnosed with a medical history and physical exam. You may also have tests, including:   Blood tests to check for infection or signs of poor nutrition.  Imaging studies to check for damage to the deep tissues under your skin.  Blood flow studies. Your pressure injury will be staged to determine its severity. Staging is an assessment of:  The depth of the pressure injury.  Which tissues are exposed because of the pressure injury.  The causes of the pressure injury. TREATMENT The main focus of treatment is to help your injury heal. This may be done by:   Relieving or redistributing pressure on your skin. This includes:  Frequently changing your position.  Eliminating or minimizing positions that caused the wound or that can make the wound worse.  Using specific bed mattresses and chair cushions.  Refitting, resizing, or replacing any medical devices, or padding the skin under them.  Using creams or powders to prevent rubbing (friction) on the skin.  Keeping your skin clean and dry. This may include using a skin cleanser or skin protectant as told by your health care provider. This may be a lotion, ointment, or spray.  Cleaning your injury and removing any dead tissue from the wound (debridement).  Placing a bandage (dressing) over your injury.  Preventing or treating infection. This may include  antibiotic, antimicrobial, or antiseptic medicines. Treatment may also include medicine for pain. Sometimes surgery is needed to close the wound with a flap of healthy skin or a piece of skin from another area of your body (graft). You may need surgery if other treatments are not working or if your injury is very deep. HOME CARE INSTRUCTIONS Wound Care  Follow instructions from your health care provider about:  How to  take care of your wound.  When and how you should change your dressing.  When you should remove your dressing. If your dressing is dry and stuck when you try to remove it, moisten or wet the dressing with saline or water so that it can be removed without harming your skin or wound tissue.  Check your wound every day for signs of infection. Have a caregiver do this for you if you are not able. Watch for:  More redness, swelling, or pain.  More fluid, blood, or pus.  A bad smell. Skin Care  Keep your skin clean and dry. Gently pat your skin dry.  Do not rub or massage your skin.  Use a skin protectant only as told by your health care provider.  Check your skin every day for any changes in color or any new blisters or sores (ulcers). Have a caregiver do this for you if you are not able. Medicines  Take over-the-counter and prescription medicines only as told by your health care provider.  If you were prescribed an antibiotic medicine, take it or apply it as told by your health care provider. Do not stop taking or using the antibiotic even if your condition improves. Reducing and Redistributing Pressure  Do not lie or sit in one position for a long time. Move or change position every two hours or as told by your health care provider.  Use pillows or cushions to reduce pressure. Ask your health care provider to recommend cushions or pads for you.  Use medical devices that do not rub your skin. Tell your health care provider if one of your medical devices is causing a pressure injury to develop. General Instructions  Eat a healthy diet that includes lots of protein. Ask your health care provider for diet advice.  Drink enough fluid to keep your urine clear or pale yellow.  Be as active as you can every day. Ask your health care provider to suggest safe exercises or activities.  Do not abuse drugs or alcohol.  Keep all follow-up visits as told by your health care provider. This  is important.  Do not smoke. SEEK MEDICAL CARE IF:  You have chills or fever.  Your pain medicine is not helping.  You have any changes in skin color.  You have new blisters or sores.  You develop warmth, redness, or swelling near a pressure injury.  You have a bad odor or pus coming from your pressure injury.  You lose control of your bowels or bladder.  You develop new symptoms.  Your wound does not improve after 1-2 weeks of treatment.  You develop a new medical condition, such as diabetes, peripheral vascular disease, or conditions that affect your defense (immune) system.   This information is not intended to replace advice given to you by your health care provider. Make sure you discuss any questions you have with your health care provider.   Document Released: 02/22/2005 Document Revised: 11/13/2014 Document Reviewed: 07/03/2014 Elsevier Interactive Patient Education [employer].

## 2015-05-09 NOTE — Progress Notes (Signed)
Patient ID: Victoria Lindsey, female   DOB: [birth year], 82 y.o.   MRN: [id number]   Location:  [facility] clinic Provider: Laylani Pudwill L. Mariea Clonts, D.O., C.M.D.  Code Status: DNR Goals of Care:  Advanced Directives 05/09/2015  Does patient have an advance directive? Yes  Type of [place];Living will  Copy of advanced directive(s) in chart? Yes   Chief Complaint  Patient presents with  . Acute Visit    sore on left outer ankle for couple of weeks, pain shooting through foot.  Here with daughter [name]    HPI: Patient is a 82 y.o. female with Parkinson's disease, HTN, CHF, peripheral neuropathy, Sjogren's, and more seen today for an acute visit for a sore on her left ankle for a couple of weeks with pain shooting through her foot.  PT had recommended lotion last week, but it's not getting better.  It's red around it.  She's been sleeping on that side b/c she is not comfortable sleeping on her right side.  She slept on her back when she was in the hospital and tolerated that.    Past Medical History  Diagnosis Date  . Hypertension   . Parkinson's disease   . Hyperlipidemia   . Dizziness   . Orthostatic hypotension   . Anemia   . Tremor   . Breast cancer ([place])     LEFT BREAST  . Kidney failure     stage 3  . Hypothyroidism   . Prediabetes   . TIA (transient ischemic attack)   . Hyperglycemia   . Left ventricular diastolic dysfunction   . Hypophonia     Past Surgical History  Procedure Laterality Date  . Cardiovascular stress test  06/2007    NORMAL  . Breast lumpectomy  2010  . Cholecystectomy  1994  . Tonsillectomy    . Nasal sinus surgery      submucous resection late 1950s  . Cataract extraction  2005,2006    Dr.Devonzo    Allergies  Allergen Reactions  . Aciphex [Rabeprazole Sodium]     unknown  . Amantadine Other (See Comments)    Pt cannot remember  . Amantadine Hcl     unknown  . Amantadines     unknown  . Avapro [Irbesartan]    unknown  . Carbidopa-Levodopa     unknown  . Cardura [Doxazosin Mesylate]     unknown  . Ciprofloxacin     unknown  . Clonidine Derivatives     unknown  . Cozaar     nightmares  . Diltiazem     unknown  . Famotidine     unknown  . Hctz [Hydrochlorothiazide]     unknown  . Indapamide     unknown  . Lansoprazole     unknown  . Lisinopril     Mouth problems  . Loratadine     unknown  . Maxidex [Dexamethasone]     unknown  . Mirapex [Pramipexole Dihydrochloride]     unknown  . Norvasc [Amlodipine Besylate]     unknown  . Pantoprazole Sodium     unknown  . Prilosec [Omeprazole]     unknown  . Procardia [Nifedipine]     unknown  . Proton Pump Inhibitors     unknown  . Sulfa Drugs Cross Reactors     unknown  . Trimethoprim     unknown  . Zyrtec [Cetirizine Hcl]     unknown  . Metronidazole Rash  . Penicillins Rash  Has patient had a PCN reaction causing immediate rash, facial/tongue/throat swelling, SOB or lightheadedness with hypotension: just a rash all over Has patient had a PCN reaction causing severe rash involving mucus membranes or skin necrosis: no Has patient had a PCN reaction that required hospitalization-no Has patient had a PCN reaction occurring within the last 10 years: no, more than 50 yrs ago If all of the above answers are "NO", then may proceed with Cephalosporin use.       Medication List       This list is accurate as of: 05/09/15  8:29 AM.  Always use your most recent med list.               AMBULATORY NON FORMULARY MEDICATION  Outpatient Speech Therapy, evaluate and treat.  DX: G20, R49.8     aspirin 81 MG tablet  Take 81 mg by mouth every evening.     AZILECT 1 MG Tabs tablet  Generic drug:  rasagiline  Take 1 mg by mouth daily.     BIOTIN PO  Take 1,000 mcg by mouth daily.     clopidogrel 75 MG tablet  Commonly known as:  PLAVIX  Take 75 mg by mouth every evening.     Cranberry 500 MG Caps  Take 500 mg by mouth  daily.     docusate sodium 100 MG capsule  Commonly known as:  COLACE  Take 100 mg by mouth 2 (two) times daily.     folic acid Q000111Q MCG tablet  Commonly known as:  FOLVITE  Take 800 mcg by mouth daily.     furosemide 40 MG tablet  Commonly known as:  LASIX  Take 1 tablet (40 mg total) by mouth daily.     gabapentin 100 MG capsule  Commonly known as:  NEURONTIN  Take 100 mg by mouth 3 (three) times daily as needed (pain).     iron polysaccharides 150 MG capsule  Commonly known as:  NIFEREX  Take 150 mg by mouth every evening.     levothyroxine 175 MCG tablet  Commonly known as:  SYNTHROID  Take 1 tablet (175 mcg total) by mouth daily before breakfast.     metoprolol 100 MG tablet  Commonly known as:  LOPRESSOR  Take 1 tablet (100 mg total) by mouth 2 (two) times daily.     multivitamin with minerals Tabs tablet  Take 1 tablet by mouth daily.     ondansetron 4 MG tablet  Commonly known as:  ZOFRAN  Take 1 tablet (4 mg total) by mouth every 8 (eight) hours as needed for nausea or vomiting.     pilocarpine 5 MG tablet  Commonly known as:  SALAGEN  Take 5 mg by mouth daily as needed (showgrens- dryness).     PROBIOTIC PO  Take 1 tablet by mouth daily.     Rotigotine 8 MG/24HR Pt24  Place 1 patch onto the skin every evening.     spironolactone 25 MG tablet  Commonly known as:  ALDACTONE  Take 1 tablet (25 mg total) by mouth daily.     SYSTANE OP  Apply 1 drop to eye 2 (two) times daily as needed (dryness).     Vitamin D3 2000 units Tabs  Take 1 tablet by mouth daily.        Review of Systems:  Review of Systems  Cardiovascular: Negative for leg swelling.       Varicose veins  Musculoskeletal: Negative for falls.  Skin: Negative  for itching and rash.       Left ankle ulcer  Neurological: Positive for tingling, tremors and sensory change.    Health Maintenance  Topic Date Due  . OPHTHALMOLOGY EXAM  02/04/1936  . PNA vac Low Risk Adult (2 of 2 - PCV13)  05/24/2014  . MAMMOGRAM  03/08/2048 (Originally 03/08/2012)  . HEMOGLOBIN A1C  07/08/2015  . INFLUENZA VACCINE  10/07/2015  . FOOT EXAM  10/08/2015  . URINE MICROALBUMIN  10/11/2015  . TETANUS/TDAP  02/08/2022  . DEXA SCAN  Completed  . ZOSTAVAX  Completed    Physical Exam: Filed Vitals:   05/09/15 0820  BP: 140/82  Pulse: 74  Temp: 97.7 F (36.5 C)  TempSrc: Oral  Height: 5\' 1"  (1.549 m)  Weight: 114 lb (51.71 kg)  SpO2: 99%   Body mass index is 21.55 kg/(m^2). Physical Exam  Constitutional: She is oriented to person, place, and time. She appears well-developed and well-nourished. No distress.  Cardiovascular:  Distal pulses intact; varicose veins severe; Raynaud's phenomenon present with purple toes  Musculoskeletal: Normal range of motion.  Walks with rollator walker, stooped posture, shuffling gait  Neurological: She is alert and oriented to person, place, and time.  Skin: Skin is warm and dry.  Left lateral malleolus with about 1cm ulcer with an eschar covering it, mild surrounding erythema (but no drainage, odor), mild tenderness present  Psychiatric: She has a normal mood and affect.    Labs reviewed: Basic Metabolic Panel:  Recent Labs  10/08/14 0832  01/08/15 0806  02/13/15 0418 02/14/15 0258 04/21/15 0929  NA 142  < > 139  < > 136 136 139  K 4.7  < > 4.7  < > 4.5 4.1 4.8  CL 100  < > 99  < > 96* 97* 96  CO2 23  < > 25  < > 33* 35* 25  GLUCOSE 92  < > 102*  < > 88 112* 98  BUN 34*  < > 29*  < > 32* 30* 36*  CREATININE 1.12*  < > 0.92  < > 1.11* 0.95 1.10*  CALCIUM 8.9  < > 9.1  < > 8.7* 8.5* 9.3  TSH 5.220*  --  9.960*  --   --   --  9.860*  < > = values in this interval not displayed. Liver Function Tests:  Recent Labs  01/08/15 0806 01/25/15 1151 04/21/15 0929  AST 46* 75* 44*  ALT 46* 78* 36*  ALKPHOS 154* 176* 166*  BILITOT 0.3 0.4 0.4  PROT 6.8 8.4* 7.1  ALBUMIN 3.8 4.2 3.9    Recent Labs  01/25/15 1151  LIPASE 33   No results  for input(s): AMMONIA in the last 8760 hours. CBC:  Recent Labs  01/08/15 0806  02/12/15 1237 02/13/15 0418 03/17/15 04/21/15 0929  WBC 6.3  < > 4.5 5.9 6.4 6.7  NEUTROABS 3.7  --   --  3.4  --  3.7  HGB  --   < > 11.0* 10.0* 11.5*  --   HCT 32.8*  < > 35.4* 32.7* 36 36.9  MCV 93  < > 95.2 97.0  --  96  PLT 123*  < > 128* 127* 127* 148*  < > = values in this interval not displayed. Lipid Panel:  Recent Labs  10/08/14 0832 01/08/15 0806  CHOL 106 112  HDL 41 39*  LDLCALC 49 55  TRIG 81 89  CHOLHDL 2.6 2.9   Lab Results  Component  Value Date   HGBA1C 7.0* 01/08/2015    Assessment/Plan 1. Bed sore on ankle, left, unstageable ([place]) -suspect pressure, but she is also diabetic and has venous insufficiency so could be one of these; however, she has known pressure on the left lateral malleolus from sleeping on that side -pressure precautions discussed, cleanse area daily with antibacterial soap, apply lotion to feet/legs, change a duoderm dressing with adhesive borders only every other day -notify me if ulcer opens, is draining or showing signs of infection  2. Chronic venous insufficiency -had been having significant edema during hospitalization, but none since d/c from rehab -does have severe varicose veins  3. Raynaud's disease -only form of arterial disease known, but may need arterial studies if we have difficulty healing this area  Labs/tests ordered:  No new Next appt:  06/06/2015  Dessire Grimes L. Amberly Livas, D.O. [facility] [address], [place] [address] Cell Phone (Mon-Fri 8am-5pm):  [phone] On Call:  [phone] & follow prompts after 5pm & weekends Office Phone:  [phone] Office Fax:  [phone]

## 2015-05-12 ENCOUNTER — Telehealth: Payer: Self-pay | Admitting: *Deleted

## 2015-05-12 DIAGNOSIS — L89500 Pressure ulcer of unspecified ankle, unstageable: Secondary | ICD-10-CM

## 2015-05-12 DIAGNOSIS — I872 Venous insufficiency (chronic) (peripheral): Secondary | ICD-10-CM

## 2015-05-12 NOTE — Telephone Encounter (Signed)
Patient daughter, [name] called and stated that the Pressure Ulcer on patient's ankle has lost the scab and is open and has alittle puss and sometimes hurts per patient. Its red and a little warm to the touch. Should she leave this spot open or cover it? If cover, with what? She got some skin protective cream, skin repair cream and put alittle bit of it on it when she had a scab.  Please Advise.

## 2015-05-12 NOTE — Telephone Encounter (Signed)
Let's apply topical antibiotic cream (neosporin) to the area.  Then cover with nonadherent dressing like telfa and wrap the ankle with kerlix gauze.  She would probably do well to have a home health nurse come out to monitor the wound if Victoria Lindsey is ok with that.  She could then show her how to dress it and manage it.

## 2015-05-13 DIAGNOSIS — R2681 Unsteadiness on feet: Secondary | ICD-10-CM | POA: Diagnosis not present

## 2015-05-13 DIAGNOSIS — G20 Parkinson's disease: Secondary | ICD-10-CM | POA: Diagnosis not present

## 2015-05-13 DIAGNOSIS — M6281 Muscle weakness (generalized): Secondary | ICD-10-CM | POA: Diagnosis not present

## 2015-05-13 DIAGNOSIS — I5033 Acute on chronic diastolic (congestive) heart failure: Secondary | ICD-10-CM | POA: Diagnosis not present

## 2015-05-13 NOTE — Telephone Encounter (Signed)
[name], daughter notified and agreed. She does want home health to come out. Order placed.

## 2015-05-14 DIAGNOSIS — G20 Parkinson's disease: Secondary | ICD-10-CM | POA: Diagnosis not present

## 2015-05-14 DIAGNOSIS — M6281 Muscle weakness (generalized): Secondary | ICD-10-CM | POA: Diagnosis not present

## 2015-05-14 DIAGNOSIS — I5033 Acute on chronic diastolic (congestive) heart failure: Secondary | ICD-10-CM | POA: Diagnosis not present

## 2015-05-14 DIAGNOSIS — R2681 Unsteadiness on feet: Secondary | ICD-10-CM | POA: Diagnosis not present

## 2015-05-14 NOTE — Telephone Encounter (Signed)
Patient daughter, [name] called back this morning and stated that patient is calling her stating the wound is very painful 9/10. Daughter wants to know if this is normal and if there is anything that she can take. Please Advise.

## 2015-05-14 NOTE — Telephone Encounter (Signed)
If pain is that bad, I'd like her to be seen at the wound care center--it might be a bit until she can get in there, so continue with the home health order as planned.  Also, she may use tylenol for the pain.  If that's been tried and not working, we can send in a prescription for tramadol 50mg  po tid as needed for severe pain.  This can be constipating, affect her mentation and balance.

## 2015-05-15 ENCOUNTER — Telehealth: Payer: Self-pay

## 2015-05-15 ENCOUNTER — Other Ambulatory Visit: Payer: Self-pay | Admitting: Internal Medicine

## 2015-05-15 DIAGNOSIS — I872 Venous insufficiency (chronic) (peripheral): Secondary | ICD-10-CM

## 2015-05-15 DIAGNOSIS — E119 Type 2 diabetes mellitus without complications: Secondary | ICD-10-CM

## 2015-05-15 DIAGNOSIS — L89520 Pressure ulcer of left ankle, unstageable: Secondary | ICD-10-CM

## 2015-05-15 NOTE — Telephone Encounter (Signed)
Victoria Lindsey from [place] called to inform Victoria Lindsey that Victoria Lindsey is getting PT/OT from Victoria Lindsey and the insurance company will not all to [employer] to care for Victoria Lindsey at the same time. Victoria Lindsey will have to get a referral to Victoria Lindsey for her nursing needs since she is already under their care. Referral to Victoria Lindsey will be closed out.   I called and spoke with Victoria Lindsey's daughter, when Victoria Lindsey was d/c'ed from [facility] she was set up with Victoria Lindsey. Victoria Lindsey's daughter aware that new referral pending for Victoria Lindsey for nursing needs.   Please place referral and advise

## 2015-05-15 NOTE — Telephone Encounter (Signed)
I didn't realize she was still getting services from another agency.  I thought it was completed.  Yes, let's add the RN services to her previous home health

## 2015-05-15 NOTE — Telephone Encounter (Signed)
Patient's daughter aware of Dr.Reed's recommendations. Pain is much better today. Patient will continue with tylenol. Patient's daughter was not interested in having patient take something that can be constipating, affect her mentation and balance

## 2015-05-16 DIAGNOSIS — G20 Parkinson's disease: Secondary | ICD-10-CM | POA: Diagnosis not present

## 2015-05-16 DIAGNOSIS — M6281 Muscle weakness (generalized): Secondary | ICD-10-CM | POA: Diagnosis not present

## 2015-05-16 DIAGNOSIS — I5033 Acute on chronic diastolic (congestive) heart failure: Secondary | ICD-10-CM | POA: Diagnosis not present

## 2015-05-16 DIAGNOSIS — R2681 Unsteadiness on feet: Secondary | ICD-10-CM | POA: Diagnosis not present

## 2015-05-16 NOTE — Telephone Encounter (Signed)
Victoria Lindsey) sent previous referral to [facility]

## 2015-05-19 DIAGNOSIS — G20 Parkinson's disease: Secondary | ICD-10-CM | POA: Diagnosis not present

## 2015-05-19 DIAGNOSIS — R2681 Unsteadiness on feet: Secondary | ICD-10-CM | POA: Diagnosis not present

## 2015-05-19 DIAGNOSIS — M6281 Muscle weakness (generalized): Secondary | ICD-10-CM | POA: Diagnosis not present

## 2015-05-19 DIAGNOSIS — I5033 Acute on chronic diastolic (congestive) heart failure: Secondary | ICD-10-CM | POA: Diagnosis not present

## 2015-05-20 DIAGNOSIS — I5033 Acute on chronic diastolic (congestive) heart failure: Secondary | ICD-10-CM | POA: Diagnosis not present

## 2015-05-20 DIAGNOSIS — M6281 Muscle weakness (generalized): Secondary | ICD-10-CM | POA: Diagnosis not present

## 2015-05-20 DIAGNOSIS — G20 Parkinson's disease: Secondary | ICD-10-CM | POA: Diagnosis not present

## 2015-05-20 DIAGNOSIS — R2681 Unsteadiness on feet: Secondary | ICD-10-CM | POA: Diagnosis not present

## 2015-05-21 ENCOUNTER — Telehealth: Payer: Self-pay

## 2015-05-21 DIAGNOSIS — M6281 Muscle weakness (generalized): Secondary | ICD-10-CM | POA: Diagnosis not present

## 2015-05-21 DIAGNOSIS — R2681 Unsteadiness on feet: Secondary | ICD-10-CM | POA: Diagnosis not present

## 2015-05-21 DIAGNOSIS — G20 Parkinson's disease: Secondary | ICD-10-CM | POA: Diagnosis not present

## 2015-05-21 DIAGNOSIS — I5033 Acute on chronic diastolic (congestive) heart failure: Secondary | ICD-10-CM | POA: Diagnosis not present

## 2015-05-21 NOTE — Telephone Encounter (Signed)
Patient had an evaluation of left ankle Ankle is tender, puffy and scabbed over, no treatment recommended. Patient will have follow-up on Friday  FYI only, Dr.Reed is out of office. I will send to covering provider.

## 2015-05-26 DIAGNOSIS — I5033 Acute on chronic diastolic (congestive) heart failure: Secondary | ICD-10-CM | POA: Diagnosis not present

## 2015-05-26 DIAGNOSIS — G20 Parkinson's disease: Secondary | ICD-10-CM | POA: Diagnosis not present

## 2015-05-26 DIAGNOSIS — M6281 Muscle weakness (generalized): Secondary | ICD-10-CM | POA: Diagnosis not present

## 2015-05-26 DIAGNOSIS — R2681 Unsteadiness on feet: Secondary | ICD-10-CM | POA: Diagnosis not present

## 2015-05-27 ENCOUNTER — Telehealth: Payer: Self-pay

## 2015-05-27 DIAGNOSIS — G20 Parkinson's disease: Secondary | ICD-10-CM | POA: Diagnosis not present

## 2015-05-27 DIAGNOSIS — R2681 Unsteadiness on feet: Secondary | ICD-10-CM | POA: Diagnosis not present

## 2015-05-27 DIAGNOSIS — I5033 Acute on chronic diastolic (congestive) heart failure: Secondary | ICD-10-CM | POA: Diagnosis not present

## 2015-05-27 DIAGNOSIS — M6281 Muscle weakness (generalized): Secondary | ICD-10-CM | POA: Diagnosis not present

## 2015-05-27 NOTE — Telephone Encounter (Signed)
Spoke with patient's daughter, [name] regarding results of bone density scan. I also inform daughter that Dr. Mariea Clonts recommended that patient take Prolia two times a year.   Daughter asked for information to be mailed to her at the address on file. She also asked for a copy of the bone density scan, which was included with prolia information.

## 2015-05-28 DIAGNOSIS — I5033 Acute on chronic diastolic (congestive) heart failure: Secondary | ICD-10-CM | POA: Diagnosis not present

## 2015-05-28 DIAGNOSIS — R2681 Unsteadiness on feet: Secondary | ICD-10-CM | POA: Diagnosis not present

## 2015-05-28 DIAGNOSIS — G20 Parkinson's disease: Secondary | ICD-10-CM | POA: Diagnosis not present

## 2015-05-28 DIAGNOSIS — M6281 Muscle weakness (generalized): Secondary | ICD-10-CM | POA: Diagnosis not present

## 2015-05-30 NOTE — Telephone Encounter (Signed)
Opened in error

## 2015-06-02 DIAGNOSIS — I5033 Acute on chronic diastolic (congestive) heart failure: Secondary | ICD-10-CM | POA: Diagnosis not present

## 2015-06-02 DIAGNOSIS — R2681 Unsteadiness on feet: Secondary | ICD-10-CM | POA: Diagnosis not present

## 2015-06-02 DIAGNOSIS — G20 Parkinson's disease: Secondary | ICD-10-CM | POA: Diagnosis not present

## 2015-06-02 DIAGNOSIS — M6281 Muscle weakness (generalized): Secondary | ICD-10-CM | POA: Diagnosis not present

## 2015-06-03 DIAGNOSIS — R2681 Unsteadiness on feet: Secondary | ICD-10-CM | POA: Diagnosis not present

## 2015-06-03 DIAGNOSIS — I5033 Acute on chronic diastolic (congestive) heart failure: Secondary | ICD-10-CM | POA: Diagnosis not present

## 2015-06-03 DIAGNOSIS — G20 Parkinson's disease: Secondary | ICD-10-CM | POA: Diagnosis not present

## 2015-06-03 DIAGNOSIS — M6281 Muscle weakness (generalized): Secondary | ICD-10-CM | POA: Diagnosis not present

## 2015-06-04 DIAGNOSIS — I5033 Acute on chronic diastolic (congestive) heart failure: Secondary | ICD-10-CM | POA: Diagnosis not present

## 2015-06-04 DIAGNOSIS — G20 Parkinson's disease: Secondary | ICD-10-CM | POA: Diagnosis not present

## 2015-06-04 DIAGNOSIS — R2681 Unsteadiness on feet: Secondary | ICD-10-CM | POA: Diagnosis not present

## 2015-06-04 DIAGNOSIS — M6281 Muscle weakness (generalized): Secondary | ICD-10-CM | POA: Diagnosis not present

## 2015-06-06 ENCOUNTER — Other Ambulatory Visit: Payer: Medicare Other

## 2015-06-06 DIAGNOSIS — E039 Hypothyroidism, unspecified: Secondary | ICD-10-CM | POA: Diagnosis not present

## 2015-06-07 LAB — T4, FREE: Free T4: 1.65 ng/dL (ref 0.82–1.77)

## 2015-06-07 LAB — TSH: TSH: 5.62 u[IU]/mL — ABNORMAL HIGH (ref 0.450–4.500)

## 2015-06-09 DIAGNOSIS — G20 Parkinson's disease: Secondary | ICD-10-CM | POA: Diagnosis not present

## 2015-06-09 DIAGNOSIS — I5033 Acute on chronic diastolic (congestive) heart failure: Secondary | ICD-10-CM | POA: Diagnosis not present

## 2015-06-09 DIAGNOSIS — M6281 Muscle weakness (generalized): Secondary | ICD-10-CM | POA: Diagnosis not present

## 2015-06-09 DIAGNOSIS — R2681 Unsteadiness on feet: Secondary | ICD-10-CM | POA: Diagnosis not present

## 2015-06-11 DIAGNOSIS — I5033 Acute on chronic diastolic (congestive) heart failure: Secondary | ICD-10-CM | POA: Diagnosis not present

## 2015-06-11 DIAGNOSIS — R2681 Unsteadiness on feet: Secondary | ICD-10-CM | POA: Diagnosis not present

## 2015-06-11 DIAGNOSIS — M6281 Muscle weakness (generalized): Secondary | ICD-10-CM | POA: Diagnosis not present

## 2015-06-11 DIAGNOSIS — G20 Parkinson's disease: Secondary | ICD-10-CM | POA: Diagnosis not present

## 2015-06-12 ENCOUNTER — Other Ambulatory Visit: Payer: Self-pay | Admitting: Internal Medicine

## 2015-06-16 DIAGNOSIS — I5033 Acute on chronic diastolic (congestive) heart failure: Secondary | ICD-10-CM | POA: Diagnosis not present

## 2015-06-16 DIAGNOSIS — R2681 Unsteadiness on feet: Secondary | ICD-10-CM | POA: Diagnosis not present

## 2015-06-16 DIAGNOSIS — G20 Parkinson's disease: Secondary | ICD-10-CM | POA: Diagnosis not present

## 2015-06-16 DIAGNOSIS — M6281 Muscle weakness (generalized): Secondary | ICD-10-CM | POA: Diagnosis not present

## 2015-06-18 DIAGNOSIS — G20 Parkinson's disease: Secondary | ICD-10-CM | POA: Diagnosis not present

## 2015-06-18 DIAGNOSIS — I5033 Acute on chronic diastolic (congestive) heart failure: Secondary | ICD-10-CM | POA: Diagnosis not present

## 2015-06-18 DIAGNOSIS — R2681 Unsteadiness on feet: Secondary | ICD-10-CM | POA: Diagnosis not present

## 2015-06-18 DIAGNOSIS — M6281 Muscle weakness (generalized): Secondary | ICD-10-CM | POA: Diagnosis not present

## 2015-06-20 ENCOUNTER — Emergency Department (HOSPITAL_COMMUNITY)
Admission: EM | Admit: 2015-06-20 | Discharge: 2015-06-20 | Disposition: A | Discharge status: Home / Self Care | Payer: Medicare Other | Attending: Emergency Medicine | Admitting: Emergency Medicine

## 2015-06-20 ENCOUNTER — Emergency Department (HOSPITAL_COMMUNITY): Payer: Medicare Other

## 2015-06-20 ENCOUNTER — Encounter (HOSPITAL_COMMUNITY): Payer: Self-pay

## 2015-06-20 DIAGNOSIS — Z862 Personal history of diseases of the blood and blood-forming organs and certain disorders involving the immune mechanism: Secondary | ICD-10-CM | POA: Diagnosis not present

## 2015-06-20 DIAGNOSIS — Y998 Other external cause status: Secondary | ICD-10-CM | POA: Diagnosis not present

## 2015-06-20 DIAGNOSIS — Z87891 Personal history of nicotine dependence: Secondary | ICD-10-CM | POA: Diagnosis not present

## 2015-06-20 DIAGNOSIS — S0101XA Laceration without foreign body of scalp, initial encounter: Secondary | ICD-10-CM | POA: Diagnosis not present

## 2015-06-20 DIAGNOSIS — Z79899 Other long term (current) drug therapy: Secondary | ICD-10-CM | POA: Insufficient documentation

## 2015-06-20 DIAGNOSIS — W19XXXA Unspecified fall, initial encounter: Secondary | ICD-10-CM

## 2015-06-20 DIAGNOSIS — Z7982 Long term (current) use of aspirin: Secondary | ICD-10-CM | POA: Diagnosis not present

## 2015-06-20 DIAGNOSIS — E039 Hypothyroidism, unspecified: Secondary | ICD-10-CM | POA: Diagnosis not present

## 2015-06-20 DIAGNOSIS — R51 Headache: Secondary | ICD-10-CM | POA: Diagnosis not present

## 2015-06-20 DIAGNOSIS — Z8673 Personal history of transient ischemic attack (TIA), and cerebral infarction without residual deficits: Secondary | ICD-10-CM | POA: Diagnosis not present

## 2015-06-20 DIAGNOSIS — S098XXA Other specified injuries of head, initial encounter: Secondary | ICD-10-CM | POA: Diagnosis not present

## 2015-06-20 DIAGNOSIS — G20 Parkinson's disease: Secondary | ICD-10-CM | POA: Diagnosis not present

## 2015-06-20 DIAGNOSIS — S51812A Laceration without foreign body of left forearm, initial encounter: Secondary | ICD-10-CM

## 2015-06-20 DIAGNOSIS — Z853 Personal history of malignant neoplasm of breast: Secondary | ICD-10-CM | POA: Insufficient documentation

## 2015-06-20 DIAGNOSIS — M542 Cervicalgia: Secondary | ICD-10-CM | POA: Diagnosis not present

## 2015-06-20 DIAGNOSIS — W01198A Fall on same level from slipping, tripping and stumbling with subsequent striking against other object, initial encounter: Secondary | ICD-10-CM | POA: Insufficient documentation

## 2015-06-20 DIAGNOSIS — S0003XA Contusion of scalp, initial encounter: Secondary | ICD-10-CM

## 2015-06-20 DIAGNOSIS — S199XXA Unspecified injury of neck, initial encounter: Secondary | ICD-10-CM | POA: Diagnosis not present

## 2015-06-20 DIAGNOSIS — I10 Essential (primary) hypertension: Secondary | ICD-10-CM | POA: Insufficient documentation

## 2015-06-20 DIAGNOSIS — Y92511 Restaurant or cafe as the place of occurrence of the external cause: Secondary | ICD-10-CM | POA: Diagnosis not present

## 2015-06-20 DIAGNOSIS — Z880 Allergy status to penicillin: Secondary | ICD-10-CM | POA: Insufficient documentation

## 2015-06-20 DIAGNOSIS — Y9389 Activity, other specified: Secondary | ICD-10-CM | POA: Insufficient documentation

## 2015-06-20 DIAGNOSIS — S0990XA Unspecified injury of head, initial encounter: Secondary | ICD-10-CM | POA: Diagnosis not present

## 2015-06-20 DIAGNOSIS — Z7902 Long term (current) use of antithrombotics/antiplatelets: Secondary | ICD-10-CM | POA: Insufficient documentation

## 2015-06-20 LAB — BASIC METABOLIC PANEL
ANION GAP: 12 (ref 5–15)
BUN: 35 mg/dL — AB (ref 6–20)
CHLORIDE: 99 mmol/L — AB (ref 101–111)
CO2: 28 mmol/L (ref 22–32)
Calcium: 9.8 mg/dL (ref 8.9–10.3)
Creatinine, Ser: 1.04 mg/dL — ABNORMAL HIGH (ref 0.44–1.00)
GFR calc Af Amer: 54 mL/min — ABNORMAL LOW (ref >60)
GFR calc non Af Amer: 47 mL/min — ABNORMAL LOW (ref >60)
GLUCOSE: 134 mg/dL — AB (ref 65–99)
POTASSIUM: 4.6 mmol/L (ref 3.5–5.1)
Sodium: 139 mmol/L (ref 135–145)

## 2015-06-20 LAB — CBC
HEMATOCRIT: 35.9 % — AB (ref 36.0–46.0)
Hemoglobin: 11.5 g/dL — ABNORMAL LOW (ref 12.0–15.0)
MCH: 32.1 pg (ref 26.0–34.0)
MCHC: 32 g/dL (ref 30.0–36.0)
MCV: 100.3 fL — AB (ref 78.0–100.0)
Platelets: 125 10*3/uL — ABNORMAL LOW (ref 150–400)
RBC: 3.58 MIL/uL — ABNORMAL LOW (ref 3.87–5.11)
RDW: 16.2 % — AB (ref 11.5–15.5)
WBC: 6 10*3/uL (ref 4.0–10.5)

## 2015-06-20 LAB — PROTIME-INR
INR: 1.12 (ref 0.00–1.49)
Prothrombin Time: 14.6 seconds (ref 11.6–15.2)

## 2015-06-20 MED ORDER — LIDOCAINE-EPINEPHRINE (PF) 2 %-1:200000 IJ SOLN
20.0000 mL | Freq: Once | INTRAMUSCULAR | Status: AC
  Administered 2015-06-20: 20 mL via INTRADERMAL
  Filled 2015-06-20: qty 20

## 2015-06-20 MED ORDER — BACITRACIN ZINC 500 UNIT/GM EX OINT: 1.0000 "application " | TOPICAL_OINTMENT | Freq: Two times a day (BID) | CUTANEOUS | Status: DC

## 2015-06-20 NOTE — ED Notes (Signed)
Pt ambulated to restroom with one assist

## 2015-06-20 NOTE — ED Notes (Signed)
Per EMS, Pt is coming from restaurant with family. Pt stood up and reports "loosing her balance" and fell backwards. Hx of the same. Pt hit her head on tile floor. EMS reports two lacerations noted to the back of her head with controlled bleeding due to pressure dressing. Pt denies back pain or any other injury. Pt is alert and oriented x4 upon baseline. Vitals per EMS: 162/68, 80 HR, 18 RR, 95% on RA, 179 CBG.

## 2015-06-20 NOTE — ED Notes (Signed)
BAcitracin ointment placed on head.

## 2015-06-20 NOTE — ED Provider Notes (Signed)
CSN: [id number]     Arrival date & time 06/20/15  1354 History   First MD Initiated Contact with Patient 06/20/15 1407     Chief Complaint  Patient presents with  . Fall     (Consider location/radiation/quality/duration/timing/severity/associated sxs/prior Treatment) HPI   Patient is a 82 year old female with history of Parkinson's, hypertension, hyperlipidemia, orthostatic hypotension, she presents the emergency room the EMS after falling backwards due to loss of balance, and subsequently hitting the back of her head and sustaining a laceration. She denies any loss of consciousness and denies any back pain, neck pain.  The patient is on baby aspirin and Plavix.  The patient did sustain a laceration to back her head and bleeding was controlled seen a pressure dressing applied by EMS.  She also has a small skin tear on her left wrist/forearm from her watch.  She denies neck pain, back pain, CP, SOB, abdominal pain.  Tetanus is UTD as of 2013.  Past Medical History  Diagnosis Date  . Hypertension   . Parkinson's disease   . Hyperlipidemia   . Dizziness   . Orthostatic hypotension   . Anemia   . Tremor   . Breast cancer ([place])     LEFT BREAST  . Kidney failure     stage 3  . Hypothyroidism   . Prediabetes   . TIA (transient ischemic attack)   . Hyperglycemia   . Left ventricular diastolic dysfunction   . Hypophonia    Past Surgical History  Procedure Laterality Date  . Cardiovascular stress test  06/2007    NORMAL  . Breast lumpectomy  2010  . Cholecystectomy  1994  . Tonsillectomy    . Nasal sinus surgery      submucous resection late 1950s  . Cataract extraction  2005,2006    Dr.Devonzo   Family History  Problem Relation Age of Onset  . Heart disease Mother   . Heart failure Father   . Parkinsonism Father   . COPD Sister   . Cancer Brother   . Osteoporosis Daughter   . Diabetes Father   . Stroke Sister    Social History  Substance Use Topics  . Smoking status:  Former Smoker -- 0.50 packs/day for 12 years    Types: Cigarettes    Quit date: 03/08/1964  . Smokeless tobacco: Never Used  . Alcohol Use: No   OB History    No data available     Review of Systems  All other systems reviewed and are negative.     Allergies  Aciphex; Amantadine; Amantadine hcl; Amantadines; Avapro; Carbidopa-levodopa; Cardura; Ciprofloxacin; Clonidine derivatives; Cozaar; Diltiazem; Famotidine; Hctz; Indapamide; Lansoprazole; Lisinopril; Loratadine; Maxidex; Mirapex; Norvasc; Pantoprazole sodium; Prilosec; Procardia; Proton pump inhibitors; Sulfa drugs cross reactors; Trimethoprim; Zyrtec; Metronidazole; and Penicillins  Home Medications   Prior to Admission medications   Medication Sig Start Date End Date Taking? Authorizing Provider  aspirin 81 MG tablet Take 81 mg by mouth every evening.    Yes Historical Provider, MD  BIOTIN PO Take 1,000 mcg by mouth daily.    Yes Historical Provider, MD  Cholecalciferol (VITAMIN D3) 2000 UNITS TABS Take 1 tablet by mouth daily.    Yes Historical Provider, MD  clopidogrel (PLAVIX) 75 MG tablet Take 75 mg by mouth every evening.   Yes Historical Provider, MD  Cranberry 500 MG CAPS Take 500 mg by mouth daily.    Yes Historical Provider, MD  docusate sodium (COLACE) 100 MG capsule Take 100 mg by  mouth daily.    Yes Historical Provider, MD  folic acid (FOLVITE) Q000111Q MCG tablet Take 800 mcg by mouth daily.    Yes Historical Provider, MD  furosemide (LASIX) 40 MG tablet Take 1 tablet (40 mg total) by mouth daily. 04/10/15  Yes [name], MD  gabapentin (NEURONTIN) 100 MG capsule Take 100 mg by mouth 3 (three) times daily as needed (pain).    Yes Historical Provider, MD  levothyroxine (SYNTHROID) 175 MCG tablet Take 1 tablet (175 mcg total) by mouth daily before breakfast. 04/24/15  Yes Tiffany L Reed, DO  metoprolol (LOPRESSOR) 100 MG tablet Take 1 tablet (100 mg total) by mouth 2 (two) times daily. 04/10/15  Yes [name],  MD  Multiple Vitamin (MULTIVITAMIN WITH MINERALS) TABS tablet Take 1 tablet by mouth daily.   Yes Historical Provider, MD  ondansetron (ZOFRAN) 4 MG tablet Take 1 tablet (4 mg total) by mouth every 8 (eight) hours as needed for nausea or vomiting. 02/14/15  Yes Delfina Redwood, MD  POLY-IRON 150 150 MG capsule Take ONE (1) capsule by mouth daily 06/12/15  Yes Tiffany L Reed, DO  Polyethyl Glycol-Propyl Glycol (SYSTANE OP) Apply 1 drop to eye 2 (two) times daily as needed (dryness).   Yes Historical Provider, MD  Probiotic Product (PROBIOTIC PO) Take 1 tablet by mouth daily.   Yes Historical Provider, MD  rasagiline (AZILECT) 1 MG TABS Take 1 mg by mouth daily.   Yes Historical Provider, MD  Rotigotine 8 MG/24HR PT24 Place 1 patch onto the skin every evening.    Yes Historical Provider, MD  spironolactone (ALDACTONE) 25 MG tablet Take 1 tablet (25 mg total) by mouth daily. 04/10/15  Yes [name], MD  SYNTHROID 150 MCG tablet Take 1 tablet by mouth daily. 04/16/15  Yes Historical Provider, MD  AMBULATORY NON FORMULARY MEDICATION Outpatient Speech Therapy, evaluate and treat.  DX: G20, R49.8 04/25/14   Tiffany L Reed, DO   BP 157/63 mmHg  Pulse 75  Temp(Src) 97.8 F (36.6 C) (Oral)  Resp 18  SpO2 92% Physical Exam  Constitutional: She is oriented to person, place, and time. She appears well-developed and well-nourished. She is cooperative.  Non-toxic appearance. She does not have a sickly appearance. No distress.  HENT:  Head: Normocephalic. Head is with abrasion and with contusion. Head is without raccoon's eyes, without Battle's sign and without laceration.  Right Ear: Tympanic membrane and external ear normal.  Left Ear: Tympanic membrane and external ear normal.  Nose: Nose normal. No sinus tenderness.  Mouth/Throat: Uvula is midline, oropharynx is clear and moist and mucous membranes are normal. No oropharyngeal exudate.  Laceration to midline occiput of the scalp, TTP  Eyes:  Conjunctivae, EOM and lids are normal. Pupils are equal, round, and reactive to light. Right eye exhibits no discharge. Left eye exhibits no discharge. No scleral icterus.  Neck: Normal range of motion. No JVD present. No spinous process tenderness and no muscular tenderness present. No tracheal deviation present. No thyromegaly present.  Cardiovascular: Normal rate, regular rhythm, normal heart sounds and intact distal pulses.  Exam reveals no gallop and no friction rub.   No murmur heard. Pulmonary/Chest: Effort normal and breath sounds normal. No respiratory distress. She has no decreased breath sounds. She has no wheezes. She has no rhonchi. She has no rales. She exhibits no tenderness.  Abdominal: Soft. Normal appearance and bowel sounds are normal. She exhibits no distension and no mass. There is no tenderness. There is no  rigidity, no rebound and no guarding.  Musculoskeletal: Normal range of motion. She exhibits no edema or tenderness.  No cervical midline tenderness, no step-off  Lymphadenopathy:    She has no cervical adenopathy.  Neurological: She is alert and oriented to person, place, and time. She has normal strength and normal reflexes. No cranial nerve deficit or sensory deficit. Gait abnormal. GCS eye subscore is 4. GCS verbal subscore is 5. GCS motor subscore is 6.  Shuffled gait  Skin: Skin is warm. Bruising and laceration noted. No rash noted. She is not diaphoretic. No cyanosis or erythema. No pallor. Nails show no clubbing.  Psychiatric: She has a normal mood and affect. Her behavior is normal. Judgment and thought content normal.  Nursing note and vitals reviewed.   ED Course  Procedures (including critical care time)  LACERATION REPAIR Performed by: Delsa Grana Consent: Verbal consent obtained. Risks and benefits: risks, benefits and alternatives were discussed Patient identity confirmed: provided demographic data Time out performed prior to procedure Prepped and  Draped in normal sterile fashion Wound explored Laceration Location: left forearm  Laceration Length: 1.5 x 1.5 V-shaped skin tear, adjacent 2 cm superficial lac/skin tear No Foreign Bodies seen or palpated Anesthesia: none Local anesthetic: n/a Anesthetic total: n/a Irrigation method: simple irrigation with saline, hydrogen peroxide and gauze to soften and reapproximate skin edges Amount of cleaning: standard Skin closure: steristrips Patient tolerance: Patient tolerated the procedure well with no immediate complications.   Labs Review Labs Reviewed  CBC - Abnormal; Notable for the following:    RBC 3.58 (*)    Hemoglobin 11.5 (*)    HCT 35.9 (*)    MCV 100.3 (*)    RDW 16.2 (*)    Platelets 125 (*)    All other components within normal limits  BASIC METABOLIC PANEL - Abnormal; Notable for the following:    Chloride 99 (*)    Glucose, Bld 134 (*)    BUN 35 (*)    Creatinine, Ser 1.04 (*)    GFR calc non Af Amer 47 (*)    GFR calc Af Amer 54 (*)    All other components within normal limits  PROTIME-INR    Imaging Review Ct Head Wo Contrast  06/20/2015  CLINICAL DATA:  Pain following fall EXAM: CT HEAD WITHOUT CONTRAST CT CERVICAL SPINE WITHOUT CONTRAST TECHNIQUE: Multidetector CT imaging of the head and cervical spine was performed following the standard protocol without intravenous contrast. Multiplanar CT image reconstructions of the cervical spine were also generated. COMPARISON:  Cervical spine CT March 01, 2014; head CT January 27, 2015 FINDINGS: CT HEAD FINDINGS Moderate diffuse atrophy is stable. There is no intracranial mass, hemorrhage, extra-axial fluid collection, or midline shift. There is mild small vessel disease in the centra semiovale bilaterally, stable. There is no new gray-white compartment lesion. No acute infarct evident. The bony calvarium appears intact. The mastoid air cells are clear. There are no intraorbital lesions. Patient has had cataract  extractions bilaterally. CT CERVICAL SPINE FINDINGS There is no fracture or spondylolisthesis. Prevertebral soft tissues and predental space regions are normal. There is moderate disc space narrowing at C5-6. There is mild disc space narrowing at C6-7. There are small anterior osteophytes at several levels in the cervical and upper thoracic region. There is facet hypertrophy at multiple levels. Facet hypertrophy is most marked at C4-5 on the right and at C5-6 on the left. There is scarring in each lung apex. Mild expansion in the right thyroid cartilage  region is stable compared to the previous study and is likely of benign etiology. IMPRESSION: CT head: Atrophy with mild periventricular small vessel disease. No intracranial mass, hemorrhage, or extra-axial fluid collection. No acute infarct. Status post cataract extractions bilaterally. CT cervical spine: No fracture or spondylolisthesis. Multifocal arthropathy, stable. Mild expansion of the thyroid cartilage on the right compared to the left appears stable and benign. Electronically Signed   By: Lowella Grip III M.D.   On: 06/20/2015 15:02   Ct Cervical Spine Wo Contrast  06/20/2015  CLINICAL DATA:  Pain following fall EXAM: CT HEAD WITHOUT CONTRAST CT CERVICAL SPINE WITHOUT CONTRAST TECHNIQUE: Multidetector CT imaging of the head and cervical spine was performed following the standard protocol without intravenous contrast. Multiplanar CT image reconstructions of the cervical spine were also generated. COMPARISON:  Cervical spine CT March 01, 2014; head CT January 27, 2015 FINDINGS: CT HEAD FINDINGS Moderate diffuse atrophy is stable. There is no intracranial mass, hemorrhage, extra-axial fluid collection, or midline shift. There is mild small vessel disease in the centra semiovale bilaterally, stable. There is no new gray-white compartment lesion. No acute infarct evident. The bony calvarium appears intact. The mastoid air cells are clear. There are  no intraorbital lesions. Patient has had cataract extractions bilaterally. CT CERVICAL SPINE FINDINGS There is no fracture or spondylolisthesis. Prevertebral soft tissues and predental space regions are normal. There is moderate disc space narrowing at C5-6. There is mild disc space narrowing at C6-7. There are small anterior osteophytes at several levels in the cervical and upper thoracic region. There is facet hypertrophy at multiple levels. Facet hypertrophy is most marked at C4-5 on the right and at C5-6 on the left. There is scarring in each lung apex. Mild expansion in the right thyroid cartilage region is stable compared to the previous study and is likely of benign etiology. IMPRESSION: CT head: Atrophy with mild periventricular small vessel disease. No intracranial mass, hemorrhage, or extra-axial fluid collection. No acute infarct. Status post cataract extractions bilaterally. CT cervical spine: No fracture or spondylolisthesis. Multifocal arthropathy, stable. Mild expansion of the thyroid cartilage on the right compared to the left appears stable and benign. Electronically Signed   By: Lowella Grip III M.D.   On: 06/20/2015 15:02   I have personally reviewed and evaluated these images and lab results as part of my medical decision-making.   EKG Interpretation None      MDM   82 year old female with mechanical fall and head trauma with injury/laceration to occipital scalp, skin tear to left forearm.  Pt denies LOC, takes ASA and plavix, no other [place].  Pt is at her baseline with coordination, gait, she is A&O x 3, non-focal neuro exam. Case discussed with Dr. Jeanell Sparrow, who is personally seen and evaluated the patient, agrees with assessment and workup. Head and cervical spine CT obtained, negative for bleed, no acute changes. Pt has not tenderness on exam to neck , chest or abdomen.  I repaired the skin tear to her left forearm with steristrips, and contusion to the back of the head was a scalp  abrasion/hematoma w/o active bleeding, no lac observed, appropriate to manage with antibiotic ointment and gauze.  Wound care reviewed.  Pt encouraged to f/up with PCP on Monday.  Return precautions reviewed.  Pt was discharged in good condition with VSS.   Final diagnoses:  Fall, initial encounter  Contusion of occipital region of scalp, initial encounter  Head injury, initial encounter  Skin tear of left forearm  without complication, initial encounter        Delsa Grana, PA-C 06/20/15 1713  Pattricia Boss, MD 06/24/15 [phone]

## 2015-06-20 NOTE — Discharge Instructions (Signed)
Facial or Scalp Contusion °A facial or scalp contusion is a deep bruise on the face or head. Injuries to the face and head generally cause a lot of swelling, especially around the eyes. Contusions are the result of an injury that caused bleeding under the skin. The contusion may turn blue, purple, or yellow. Minor injuries will give you a painless contusion, but more severe contusions may stay painful and swollen for a few weeks.  °CAUSES  °A facial or scalp contusion is caused by a blunt injury or trauma to the face or head area.  °SIGNS AND SYMPTOMS  °· Swelling of the injured area.   °· Discoloration of the injured area.   °· Tenderness, soreness, or pain in the injured area.   °DIAGNOSIS  °The diagnosis can be made by taking a medical history and doing a physical exam. An X-ray exam, CT scan, or MRI may be needed to determine if there are any associated injuries, such as broken bones (fractures). °TREATMENT  °Often, the best treatment for a facial or scalp contusion is applying cold compresses to the injured area. Over-the-counter medicines may also be recommended for pain control.  °HOME CARE INSTRUCTIONS  °· Only take over-the-counter or prescription medicines as directed by your health care provider.   °· Apply ice to the injured area.   °· Put ice in a plastic bag.   °· Place a towel between your skin and the bag.   °· Leave the ice on for 20 minutes, 2-3 times a day.   °SEEK MEDICAL CARE IF: °· You have bite problems.   °· You have pain with chewing.   °· You are concerned about facial defects. °SEEK IMMEDIATE MEDICAL CARE IF: °· You have severe pain or a headache that is not relieved by medicine.   °· You have unusual sleepiness, confusion, or personality changes.   °· You throw up (vomit).   °· You have a persistent nosebleed.   °· You have double vision or blurred vision.   °· You have fluid drainage from your nose or ear.   °· You have difficulty walking or using your arms or legs.   °MAKE SURE YOU:   °· Understand these instructions. °· Will watch your condition. °· Will get help right away if you are not doing well or get worse. °  °This information is not intended to replace advice given to you by your health care provider. Make sure you discuss any questions you have with your health care provider. °  °Document Released: 04/01/2004 Document Revised: 03/15/2014 Document Reviewed: 10/05/2012 °Elsevier Interactive Patient Education ©2016 [employer] ° °Head Injury, Adult °You have a head injury. Headaches and throwing up (vomiting) are common after a head injury. It should be easy to wake up from sleeping. Sometimes you must stay in the hospital. Most problems happen within the first 24 hours. Side effects may occur up to 7-10 days after the injury.  °WHAT ARE THE TYPES OF HEAD INJURIES? °Head injuries can be as minor as a bump. Some head injuries can be more severe. More severe head injuries include: °· A jarring injury to the brain (concussion). °· A bruise of the brain (contusion). This mean there is bleeding in the brain that can cause swelling. °· A cracked skull (skull fracture). °· Bleeding in the brain that collects, clots, and forms a bump (hematoma). °WHEN SHOULD I GET HELP RIGHT AWAY?  °· You are confused or sleepy. °· You cannot be woken up. °· You feel sick to your stomach (nauseous) or keep throwing up (vomiting). °· Your dizziness or   unsteadiness is getting worse.  You have very bad, lasting headaches that are not helped by medicine. Take medicines only as told by your doctor.  You cannot use your arms or legs like normal.  You cannot walk.  You notice changes in the black spots in the center of the colored part of your eye (pupil).  You have clear or bloody fluid coming from your nose or ears.  You have trouble seeing. During the next 24 hours after the injury, you must stay with someone who can watch you. This person should get help right away (call 911 in the [place] if you start  to shake and are not able to control it (have seizures), you pass out, or you are unable to wake up. HOW CAN I PREVENT A HEAD INJURY IN THE FUTURE?  Wear seat belts.  Wear a helmet while bike riding and playing sports like football.  Stay away from dangerous activities around the house. WHEN CAN I RETURN TO NORMAL ACTIVITIES AND ATHLETICS? See your doctor before doing these activities. You should not do normal activities or play contact sports until 1 week after the following symptoms have stopped:  Headache that does not go away.  Dizziness.  Poor attention.  Confusion.  Memory problems.  Sickness to your stomach or throwing up.  Tiredness.  Fussiness.  Bothered by bright lights or loud noises.  Anxiousness or depression.  Restless sleep. MAKE SURE YOU:   Understand these instructions.  Will watch your condition.  Will get help right away if you are not doing well or get worse.   This information is not intended to replace advice given to you by your health care provider. Make sure you discuss any questions you have with your health care provider.   Document Released: 02/05/2008 Document Revised: 03/15/2014 Document Reviewed: 10/30/2012 Elsevier Interactive Patient Education 2016 [place] Taking care of your wound properly can help to prevent pain and infection. It can also help your wound to heal more quickly.  HOW TO CARE FOR YOUR WOUND  Take or apply over-the-counter and prescription medicines only as told by your health care provider.  If you were prescribed antibiotic medicine, take or apply it as told by your health care provider. Do not stop using the antibiotic even if your condition improves.  Clean the wound each day or as told by your health care provider.  Wash the wound with mild soap and water.  Rinse the wound with water to remove all soap.  Pat the wound dry with a clean towel. Do not rub it.  There are many different  ways to close and cover a wound. For example, a wound can be covered with stitches (sutures), skin glue, or adhesive strips. Follow instructions from your health care provider about:  How to take care of your wound.  When and how you should change your bandage (dressing).  When you should remove your dressing.  Removing whatever was used to close your wound.  Check your wound every day for signs of infection. Watch for:  Redness, swelling, or pain.  Fluid, blood, or pus.  Keep the dressing dry until your health care provider says it can be removed. Do not take baths, swim, use a hot tub, or do anything that would put your wound underwater until your health care provider approves.  Raise (elevate) the injured area above the level of your heart while you are sitting or lying down.  Do not scratch or  pick at the wound.  Keep all follow-up visits as told by your health care provider. This is important. SEEK MEDICAL CARE IF:  You received a tetanus shot and you have swelling, severe pain, redness, or bleeding at the injection site.  You have a fever.  Your pain is not controlled with medicine.  You have increased redness, swelling, or pain at the site of your wound.  You have fluid, blood, or pus coming from your wound.  You notice a bad smell coming from your wound or your dressing. SEEK IMMEDIATE MEDICAL CARE IF:  You have a red streak going away from your wound.   This information is not intended to replace advice given to you by your health care provider. Make sure you discuss any questions you have with your health care provider.   Document Released: 12/02/2007 Document Revised: 07/09/2014 Document Reviewed: 02/18/2014 Elsevier Interactive Patient Education 2016 [employer] Inc[employer] Skin Tear Care A skin tear is a wound in which the top layer of skin has peeled off. This is a common problem with aging because the skin becomes thinner and more fragile as a person gets older. In  addition, some medicines, such as oral corticosteroids, can lead to skin thinning if taken for long periods of time.  A skin tear is often repaired with tape or skin adhesive strips. This keeps the skin that has been peeled off in contact with the healthier skin beneath. Depending on the location of the wound, a bandage (dressing) may be applied over the tape or skin adhesive strips. Sometimes, during the healing process, the skin turns black and dies. Even when this happens, the torn skin acts as a good dressing until the skin underneath gets healthier and repairs itself. HOME CARE INSTRUCTIONS   Change dressings once per day or as directed by your caregiver.  Gently clean the skin tear and the area around the tear using saline solution or mild soap and water.  Do not rub the injured skin dry. Let the area air dry.  Apply petroleum jelly or an antibiotic cream or ointment to keep the tear moist. This will help the wound heal. Do not allow a scab to form.  If the dressing sticks before the next dressing change, moisten it with warm soapy water and gently remove it.  Protect the injured skin until it has healed.  Only take over-the-counter or prescription medicines as directed by your caregiver.  Take showers or baths using warm soapy water. Apply a new dressing after the shower or bath.  Keep all follow-up appointments as directed by your caregiver.  SEEK IMMEDIATE MEDICAL CARE IF:   You have redness, swelling, or increasing pain in the skin tear.  You havepus coming from the skin tear.  You have chills.  You have a red streak that goes away from the skin tear.  You have a bad smell coming from the tear or dressing.  You have a fever or persistent symptoms for more than 2-3 days.  You have a fever and your symptoms suddenly get worse. MAKE SURE YOU:  Understand these instructions.  Will watch this condition.  Will get help right away if your child is not doing well or gets  worse.   This information is not intended to replace advice given to you by your health care provider. Make sure you discuss any questions you have with your health care provider.   Document Released: 11/17/2000 Document Revised: 11/17/2011 Document Reviewed: 09/06/2011 Elsevier Interactive Patient Education  Education ©2016 Elsevier Inc. ° °

## 2015-06-20 NOTE — ED Notes (Signed)
Pt ambulated to restroom from EMS stretcher with one assist. Pt then ambulated to room with one assist.

## 2015-06-21 DIAGNOSIS — G20 Parkinson's disease: Secondary | ICD-10-CM | POA: Diagnosis not present

## 2015-06-21 DIAGNOSIS — I5033 Acute on chronic diastolic (congestive) heart failure: Secondary | ICD-10-CM | POA: Diagnosis not present

## 2015-06-21 DIAGNOSIS — R2681 Unsteadiness on feet: Secondary | ICD-10-CM | POA: Diagnosis not present

## 2015-06-21 DIAGNOSIS — M6281 Muscle weakness (generalized): Secondary | ICD-10-CM | POA: Diagnosis not present

## 2015-06-23 DIAGNOSIS — I5033 Acute on chronic diastolic (congestive) heart failure: Secondary | ICD-10-CM | POA: Diagnosis not present

## 2015-06-23 DIAGNOSIS — G20 Parkinson's disease: Secondary | ICD-10-CM | POA: Diagnosis not present

## 2015-06-23 DIAGNOSIS — M6281 Muscle weakness (generalized): Secondary | ICD-10-CM | POA: Diagnosis not present

## 2015-06-23 DIAGNOSIS — R2681 Unsteadiness on feet: Secondary | ICD-10-CM | POA: Diagnosis not present

## 2015-06-25 DIAGNOSIS — I5033 Acute on chronic diastolic (congestive) heart failure: Secondary | ICD-10-CM | POA: Diagnosis not present

## 2015-06-25 DIAGNOSIS — R2681 Unsteadiness on feet: Secondary | ICD-10-CM | POA: Diagnosis not present

## 2015-06-25 DIAGNOSIS — G20 Parkinson's disease: Secondary | ICD-10-CM | POA: Diagnosis not present

## 2015-06-25 DIAGNOSIS — M6281 Muscle weakness (generalized): Secondary | ICD-10-CM | POA: Diagnosis not present

## 2015-06-26 ENCOUNTER — Telehealth: Payer: Self-pay | Admitting: Internal Medicine

## 2015-06-26 NOTE — Telephone Encounter (Signed)
-----   Message from Eilene Ghazi, [place] sent at 06/26/2015  3:47 PM EDT ----- Patient's home health nurse called to inform Dr. Mariea Clonts that she had fell while at the restaurant, she has an abrasion on her head, skin tear on left arm. There is no treatment need or stitches. FYI.

## 2015-06-27 DIAGNOSIS — M6281 Muscle weakness (generalized): Secondary | ICD-10-CM | POA: Diagnosis not present

## 2015-06-27 DIAGNOSIS — G20 Parkinson's disease: Secondary | ICD-10-CM | POA: Diagnosis not present

## 2015-06-27 DIAGNOSIS — R2681 Unsteadiness on feet: Secondary | ICD-10-CM | POA: Diagnosis not present

## 2015-06-27 DIAGNOSIS — I5033 Acute on chronic diastolic (congestive) heart failure: Secondary | ICD-10-CM | POA: Diagnosis not present

## 2015-06-30 DIAGNOSIS — G20 Parkinson's disease: Secondary | ICD-10-CM | POA: Diagnosis not present

## 2015-06-30 DIAGNOSIS — I5033 Acute on chronic diastolic (congestive) heart failure: Secondary | ICD-10-CM | POA: Diagnosis not present

## 2015-06-30 DIAGNOSIS — R2681 Unsteadiness on feet: Secondary | ICD-10-CM | POA: Diagnosis not present

## 2015-06-30 DIAGNOSIS — M6281 Muscle weakness (generalized): Secondary | ICD-10-CM | POA: Diagnosis not present

## 2015-07-02 DIAGNOSIS — G20 Parkinson's disease: Secondary | ICD-10-CM | POA: Diagnosis not present

## 2015-07-02 DIAGNOSIS — I5033 Acute on chronic diastolic (congestive) heart failure: Secondary | ICD-10-CM | POA: Diagnosis not present

## 2015-07-02 DIAGNOSIS — R2681 Unsteadiness on feet: Secondary | ICD-10-CM | POA: Diagnosis not present

## 2015-07-02 DIAGNOSIS — M6281 Muscle weakness (generalized): Secondary | ICD-10-CM | POA: Diagnosis not present

## 2015-07-03 DIAGNOSIS — M6281 Muscle weakness (generalized): Secondary | ICD-10-CM | POA: Diagnosis not present

## 2015-07-03 DIAGNOSIS — R2681 Unsteadiness on feet: Secondary | ICD-10-CM | POA: Diagnosis not present

## 2015-07-03 DIAGNOSIS — G20 Parkinson's disease: Secondary | ICD-10-CM | POA: Diagnosis not present

## 2015-07-03 DIAGNOSIS — I5033 Acute on chronic diastolic (congestive) heart failure: Secondary | ICD-10-CM | POA: Diagnosis not present

## 2015-07-07 DIAGNOSIS — R2681 Unsteadiness on feet: Secondary | ICD-10-CM | POA: Diagnosis not present

## 2015-07-07 DIAGNOSIS — M6281 Muscle weakness (generalized): Secondary | ICD-10-CM | POA: Diagnosis not present

## 2015-07-07 DIAGNOSIS — G20 Parkinson's disease: Secondary | ICD-10-CM | POA: Diagnosis not present

## 2015-07-07 DIAGNOSIS — I5033 Acute on chronic diastolic (congestive) heart failure: Secondary | ICD-10-CM | POA: Diagnosis not present

## 2015-07-09 DIAGNOSIS — M6281 Muscle weakness (generalized): Secondary | ICD-10-CM | POA: Diagnosis not present

## 2015-07-09 DIAGNOSIS — G20 Parkinson's disease: Secondary | ICD-10-CM | POA: Diagnosis not present

## 2015-07-09 DIAGNOSIS — I5033 Acute on chronic diastolic (congestive) heart failure: Secondary | ICD-10-CM | POA: Diagnosis not present

## 2015-07-09 DIAGNOSIS — R2681 Unsteadiness on feet: Secondary | ICD-10-CM | POA: Diagnosis not present

## 2015-07-10 DIAGNOSIS — M6281 Muscle weakness (generalized): Secondary | ICD-10-CM | POA: Diagnosis not present

## 2015-07-10 DIAGNOSIS — R2681 Unsteadiness on feet: Secondary | ICD-10-CM | POA: Diagnosis not present

## 2015-07-10 DIAGNOSIS — I5033 Acute on chronic diastolic (congestive) heart failure: Secondary | ICD-10-CM | POA: Diagnosis not present

## 2015-07-10 DIAGNOSIS — G20 Parkinson's disease: Secondary | ICD-10-CM | POA: Diagnosis not present

## 2015-07-11 ENCOUNTER — Encounter: Payer: Self-pay | Admitting: Internal Medicine

## 2015-07-11 ENCOUNTER — Ambulatory Visit (INDEPENDENT_AMBULATORY_CARE_PROVIDER_SITE_OTHER): Payer: Medicare Other | Admitting: Internal Medicine

## 2015-07-11 VITALS — BP 128/76 | HR 77 | Temp 98.0°F | Wt 115.0 lb

## 2015-07-11 DIAGNOSIS — R296 Repeated falls: Secondary | ICD-10-CM | POA: Diagnosis not present

## 2015-07-11 DIAGNOSIS — L89500 Pressure ulcer of unspecified ankle, unstageable: Secondary | ICD-10-CM | POA: Diagnosis not present

## 2015-07-11 DIAGNOSIS — I872 Venous insufficiency (chronic) (peripheral): Secondary | ICD-10-CM | POA: Diagnosis not present

## 2015-07-11 DIAGNOSIS — L89509 Pressure ulcer of unspecified ankle, unspecified stage: Secondary | ICD-10-CM | POA: Insufficient documentation

## 2015-07-11 NOTE — Progress Notes (Signed)
Patient ID: Victoria Lindsey, female   DOB: [birth year], 82 y.o.   MRN: [id number]   Location:  [facility] clinic Provider: Juleah Paradise L. Mariea Clonts, D.O., C.M.D.  Code Status: DNR Goals of Care:  Advanced Directives 07/11/2015  Does patient have an advance directive? Yes  Type of Advance Directive Healthcare Power of Attorney  Copy of advanced directive(s) in chart? Yes  Would patient like information on creating an advanced directive? -   Chief Complaint  Patient presents with  . Acute Visit    left ankle redness    HPI: Patient is a 82 y.o. female seen today for an acute visit for evaluation of left ankle "pressure ulcer".  It may actually be venous, but it started due to pressure from sleeping.    She had a fall at [employer] (both fell).  Went to ED.  Her daughter was 4 hrs away.  First information she got was that pt was getting head CT at 230pm via a mychart message.    Pt reports she is trying to stay off the left ankle when sleeping.    Past Medical History  Diagnosis Date  . Hypertension   . Parkinson's disease   . Hyperlipidemia   . Dizziness   . Orthostatic hypotension   . Anemia   . Tremor   . Breast cancer ([place])     LEFT BREAST  . Kidney failure     stage 3  . Hypothyroidism   . Prediabetes   . TIA (transient ischemic attack)   . Hyperglycemia   . Left ventricular diastolic dysfunction   . Hypophonia     Past Surgical History  Procedure Laterality Date  . Cardiovascular stress test  06/2007    NORMAL  . Breast lumpectomy  2010  . Cholecystectomy  1994  . Tonsillectomy    . Nasal sinus surgery      submucous resection late 1950s  . Cataract extraction  2005,2006    Dr.Devonzo    Allergies  Allergen Reactions  . Aciphex [Rabeprazole Sodium]     unknown  . Amantadine Other (See Comments)    Pt cannot remember  . Amantadine Hcl     unknown  . Amantadines     unknown  . Avapro [Irbesartan]     unknown  . Carbidopa-Levodopa     unknown  . Cardura [Doxazosin  Mesylate]     unknown  . Ciprofloxacin     unknown  . Clonidine Derivatives     unknown  . Cozaar     nightmares  . Diltiazem     unknown  . Famotidine     unknown  . Hctz [Hydrochlorothiazide]     unknown  . Indapamide     unknown  . Lansoprazole     unknown  . Lisinopril     Mouth problems  . Loratadine     unknown  . Maxidex [Dexamethasone]     unknown  . Mirapex [Pramipexole Dihydrochloride]     unknown  . Norvasc [Amlodipine Besylate]     unknown  . Pantoprazole Sodium     unknown  . Prilosec [Omeprazole]     unknown  . Procardia [Nifedipine]     unknown  . Proton Pump Inhibitors     unknown  . Sulfa Drugs Cross Reactors     unknown  . Trimethoprim     unknown  . Zyrtec [Cetirizine Hcl]     unknown  . Metronidazole Rash  . Penicillins Rash  Has patient had a PCN reaction causing immediate rash, facial/tongue/throat swelling, SOB or lightheadedness with hypotension: just a rash all over Has patient had a PCN reaction causing severe rash involving mucus membranes or skin necrosis: no Has patient had a PCN reaction that required hospitalization-no Has patient had a PCN reaction occurring within the last 10 years: no, more than 50 yrs ago If all of the above answers are "NO", then may proceed with Cephalosporin use.       Medication List       This list is accurate as of: 07/11/15  9:20 AM.  Always use your most recent med list.               AMBULATORY NON FORMULARY MEDICATION  Outpatient Speech Therapy, evaluate and treat.  DX: G20, R49.8     aspirin 81 MG tablet  Take 81 mg by mouth every evening.     AZILECT 1 MG Tabs tablet  Generic drug:  rasagiline  Take 1 mg by mouth daily.     BIOTIN PO  Take 1,000 mcg by mouth daily.     clopidogrel 75 MG tablet  Commonly known as:  PLAVIX  Take 75 mg by mouth every evening.     Cranberry 500 MG Caps  Take 500 mg by mouth daily.     docusate sodium 100 MG capsule  Commonly known as:  COLACE   Take 100 mg by mouth daily.     folic acid Q000111Q MCG tablet  Commonly known as:  FOLVITE  Take 800 mcg by mouth daily.     furosemide 40 MG tablet  Commonly known as:  LASIX  Take 1 tablet (40 mg total) by mouth daily.     gabapentin 100 MG capsule  Commonly known as:  NEURONTIN  Take 100 mg by mouth 3 (three) times daily as needed (pain).     levothyroxine 175 MCG tablet  Commonly known as:  SYNTHROID  Take 1 tablet (175 mcg total) by mouth daily before breakfast.     metoprolol 100 MG tablet  Commonly known as:  LOPRESSOR  Take 1 tablet (100 mg total) by mouth 2 (two) times daily.     multivitamin with minerals Tabs tablet  Take 1 tablet by mouth daily.     ondansetron 4 MG tablet  Commonly known as:  ZOFRAN  Take 1 tablet (4 mg total) by mouth every 8 (eight) hours as needed for nausea or vomiting.     POLY-IRON 150 150 MG capsule  Generic drug:  iron polysaccharides  Take ONE (1) capsule by mouth daily     PROBIOTIC PO  Take 1 tablet by mouth daily.     Rotigotine 8 MG/24HR Pt24  Place 1 patch onto the skin every evening.     spironolactone 25 MG tablet  Commonly known as:  ALDACTONE  Take 1 tablet (25 mg total) by mouth daily.     SYSTANE OP  Apply 1 drop to eye 2 (two) times daily as needed (dryness).     Vitamin D3 2000 units Tabs  Take 1 tablet by mouth daily.        Review of Systems:  Review of Systems  Constitutional: Negative for fever and chills.  HENT: Positive for hearing loss.   Eyes: Negative for blurred vision.       Glasses  Respiratory: Negative for shortness of breath.   Cardiovascular: Negative for chest pain, palpitations and leg swelling.  Gastrointestinal: Negative for abdominal  pain, constipation, blood in stool and melena.  Genitourinary: Negative for dysuria, urgency and frequency.  Musculoskeletal: Positive for falls. Negative for myalgias.  Skin:       Scab on scalp from fall, left lateral ankle wound  Neurological:  Positive for tremors. Negative for headaches.  Endo/Heme/Allergies: Does not bruise/bleed easily.  Psychiatric/Behavioral: Positive for memory loss. Negative for depression. The patient is not nervous/anxious and does not have insomnia.        [facility]    Health Maintenance  Topic Date Due  . OPHTHALMOLOGY EXAM  02/04/1936  . PNA vac Low Risk Adult (2 of 2 - PCV13) 05/24/2014  . HEMOGLOBIN A1C  07/08/2015  . MAMMOGRAM  03/08/2048 (Originally 03/08/2012)  . INFLUENZA VACCINE  10/07/2015  . FOOT EXAM  10/08/2015  . URINE MICROALBUMIN  10/11/2015  . TETANUS/TDAP  02/08/2022  . DEXA SCAN  Completed  . ZOSTAVAX  Completed    Physical Exam: Filed Vitals:   07/11/15 0847  BP: 128/76  Pulse: 77  Temp: 98 F (36.7 C)  TempSrc: Oral  Weight: 115 lb (52.164 kg)  SpO2: 95%   Body mass index is 21.74 kg/(m^2). Physical Exam  Constitutional: She is oriented to person, place, and time. No distress.  Cardiovascular: Normal rate, regular rhythm and normal heart sounds.   Pulmonary/Chest: Effort normal and breath sounds normal.  Musculoskeletal: Normal range of motion.  Walks with rollator walker  Neurological: She is alert and oriented to person, place, and time. No cranial nerve deficit.  Skin: Skin is warm and dry.  Minimal erythema around pinpoint left lateral malleolus wound, one tiny spot of yellow drainage present on dressing, nontender; posterior scalp scab  Psychiatric: She has a normal mood and affect.    Labs reviewed: Basic Metabolic Panel:  Recent Labs  01/08/15 0806  02/14/15 0258 04/21/15 0929 06/06/15 0936 06/20/15 1421  NA 139  < > 136 139  --  139  K 4.7  < > 4.1 4.8  --  4.6  CL 99  < > 97* 96  --  99*  CO2 25  < > 35* 25  --  28  GLUCOSE 102*  < > 112* 98  --  134*  BUN 29*  < > 30* 36*  --  35*  CREATININE 0.92  < > 0.95 1.10*  --  1.04*  CALCIUM 9.1  < > 8.5* 9.3  --  9.8  TSH 9.960*  --   --  9.860* 5.620*  --   < > = values in this interval not  displayed. Liver Function Tests:  Recent Labs  01/08/15 0806 01/25/15 1151 04/21/15 0929  AST 46* 75* 44*  ALT 46* 78* 36*  ALKPHOS 154* 176* 166*  BILITOT 0.3 0.4 0.4  PROT 6.8 8.4* 7.1  ALBUMIN 3.8 4.2 3.9    Recent Labs  01/25/15 1151  LIPASE 33   No results for input(s): AMMONIA in the last 8760 hours. CBC:  Recent Labs  01/08/15 0806  02/13/15 0418 03/17/15 04/21/15 0929 06/20/15 1421  WBC 6.3  < > 5.9 6.4 6.7 6.0  NEUTROABS 3.7  --  3.4  --  3.7  --   HGB  --   < > 10.0* 11.5*  --  11.5*  HCT 32.8*  < > 32.7* 36 36.9 35.9*  MCV 93  < > 97.0  --  96 100.3*  PLT 123*  < > 127* 127* 148* 125*  < > = values in this  interval not displayed. Lipid Panel:  Recent Labs  10/08/14 0832 01/08/15 0806  CHOL 106 112  HDL 41 39*  LDLCALC 49 55  TRIG 81 89  CHOLHDL 2.6 2.9   Lab Results  Component Value Date   HGBA1C 7.0* 01/08/2015    Procedures since last visit: Ct Head Wo Contrast  06/20/2015  CLINICAL DATA:  Pain following fall EXAM: CT HEAD WITHOUT CONTRAST CT CERVICAL SPINE WITHOUT CONTRAST TECHNIQUE: Multidetector CT imaging of the head and cervical spine was performed following the standard protocol without intravenous contrast. Multiplanar CT image reconstructions of the cervical spine were also generated. COMPARISON:  Cervical spine CT March 01, 2014; head CT January 27, 2015 FINDINGS: CT HEAD FINDINGS Moderate diffuse atrophy is stable. There is no intracranial mass, hemorrhage, extra-axial fluid collection, or midline shift. There is mild small vessel disease in the centra semiovale bilaterally, stable. There is no new gray-white compartment lesion. No acute infarct evident. The bony calvarium appears intact. The mastoid air cells are clear. There are no intraorbital lesions. Patient has had cataract extractions bilaterally. CT CERVICAL SPINE FINDINGS There is no fracture or spondylolisthesis. Prevertebral soft tissues and predental space regions are  normal. There is moderate disc space narrowing at C5-6. There is mild disc space narrowing at C6-7. There are small anterior osteophytes at several levels in the cervical and upper thoracic region. There is facet hypertrophy at multiple levels. Facet hypertrophy is most marked at C4-5 on the right and at C5-6 on the left. There is scarring in each lung apex. Mild expansion in the right thyroid cartilage region is stable compared to the previous study and is likely of benign etiology. IMPRESSION: CT head: Atrophy with mild periventricular small vessel disease. No intracranial mass, hemorrhage, or extra-axial fluid collection. No acute infarct. Status post cataract extractions bilaterally. CT cervical spine: No fracture or spondylolisthesis. Multifocal arthropathy, stable. Mild expansion of the thyroid cartilage on the right compared to the left appears stable and benign. Electronically Signed   By: Lowella Grip III M.D.   On: 06/20/2015 15:02   Ct Cervical Spine Wo Contrast  06/20/2015  CLINICAL DATA:  Pain following fall EXAM: CT HEAD WITHOUT CONTRAST CT CERVICAL SPINE WITHOUT CONTRAST TECHNIQUE: Multidetector CT imaging of the head and cervical spine was performed following the standard protocol without intravenous contrast. Multiplanar CT image reconstructions of the cervical spine were also generated. COMPARISON:  Cervical spine CT March 01, 2014; head CT January 27, 2015 FINDINGS: CT HEAD FINDINGS Moderate diffuse atrophy is stable. There is no intracranial mass, hemorrhage, extra-axial fluid collection, or midline shift. There is mild small vessel disease in the centra semiovale bilaterally, stable. There is no new gray-white compartment lesion. No acute infarct evident. The bony calvarium appears intact. The mastoid air cells are clear. There are no intraorbital lesions. Patient has had cataract extractions bilaterally. CT CERVICAL SPINE FINDINGS There is no fracture or spondylolisthesis.  Prevertebral soft tissues and predental space regions are normal. There is moderate disc space narrowing at C5-6. There is mild disc space narrowing at C6-7. There are small anterior osteophytes at several levels in the cervical and upper thoracic region. There is facet hypertrophy at multiple levels. Facet hypertrophy is most marked at C4-5 on the right and at C5-6 on the left. There is scarring in each lung apex. Mild expansion in the right thyroid cartilage region is stable compared to the previous study and is likely of benign etiology. IMPRESSION: CT head: Atrophy with mild periventricular small  vessel disease. No intracranial mass, hemorrhage, or extra-axial fluid collection. No acute infarct. Status post cataract extractions bilaterally. CT cervical spine: No fracture or spondylolisthesis. Multifocal arthropathy, stable. Mild expansion of the thyroid cartilage on the right compared to the left appears stable and benign. Electronically Signed   By: Lowella Grip III M.D.   On: 06/20/2015 15:02    Assessment/Plan 1. Pressure sore on ankle, unspecified laterality, unstageable ([place]) - slowly healing, apparently was closed two home health RN visits ago, but had opened again when RN visited and had more erythema so there were concerns about osteomyelitis -today, erythema very minimal, nontender -reminded about offloading -advised to keep clean and dry and continue cushioned dressing change twice weekly, elevated feet at rest -await labs: - Sedimentation Rate - CBC with Differential/Platelet  2. Falls frequently -just had another fall where she and her husband fell at [employer] and she hit her head--was in ED, but CT w/o any acute injury, still has small scab -cont use of rollator walker  3. Venous insufficiency -cont to elevate feet at rest -I suspect her wound may actually be venous which could be a big portion of why it's so slowly healing  Labs/tests ordered:   Orders Placed This  Encounter  Procedures  . Sedimentation Rate  . CBC with Differential/Platelet   Next appt:  07/24/2015   Joycie Aerts L. Gypsy Kellogg, D.O. [facility] [address], [place] [address] Cell Phone (Mon-Fri 8am-5pm):  [phone] On Call:  [phone] & follow prompts after 5pm & weekends Office Phone:  [phone] Office Fax:  [phone]

## 2015-07-12 DIAGNOSIS — L89522 Pressure ulcer of left ankle, stage 2: Secondary | ICD-10-CM | POA: Diagnosis not present

## 2015-07-12 DIAGNOSIS — R2681 Unsteadiness on feet: Secondary | ICD-10-CM | POA: Diagnosis not present

## 2015-07-12 DIAGNOSIS — I5033 Acute on chronic diastolic (congestive) heart failure: Secondary | ICD-10-CM | POA: Diagnosis not present

## 2015-07-12 DIAGNOSIS — G20 Parkinson's disease: Secondary | ICD-10-CM | POA: Diagnosis not present

## 2015-07-12 DIAGNOSIS — M6281 Muscle weakness (generalized): Secondary | ICD-10-CM | POA: Diagnosis not present

## 2015-07-12 LAB — CBC WITH DIFFERENTIAL/PLATELET
Basophils Absolute: 0 10*3/uL (ref 0.0–0.2)
Basos: 0 %
EOS (ABSOLUTE): 0.2 10*3/uL (ref 0.0–0.4)
Eos: 3 %
Hematocrit: 35.4 % (ref 34.0–46.6)
Hemoglobin: 11.5 g/dL (ref 11.1–15.9)
Immature Grans (Abs): 0 10*3/uL (ref 0.0–0.1)
Immature Granulocytes: 0 %
Lymphocytes Absolute: 1.5 10*3/uL (ref 0.7–3.1)
Lymphs: 25 %
MCH: 31.4 pg (ref 26.6–33.0)
MCHC: 32.5 g/dL (ref 31.5–35.7)
MCV: 97 fL (ref 79–97)
Monocytes Absolute: 1.1 10*3/uL — ABNORMAL HIGH (ref 0.1–0.9)
Monocytes: 17 %
Neutrophils Absolute: 3.4 10*3/uL (ref 1.4–7.0)
Neutrophils: 55 %
Platelets: 132 10*3/uL — ABNORMAL LOW (ref 150–379)
RBC: 3.66 x10E6/uL — ABNORMAL LOW (ref 3.77–5.28)
RDW: 16 % — ABNORMAL HIGH (ref 12.3–15.4)
WBC: 6.1 10*3/uL (ref 3.4–10.8)

## 2015-07-12 LAB — SEDIMENTATION RATE: Sed Rate: 53 mm/hr — ABNORMAL HIGH (ref 0–40)

## 2015-07-14 ENCOUNTER — Other Ambulatory Visit: Payer: Self-pay | Admitting: Internal Medicine

## 2015-07-14 DIAGNOSIS — L89500 Pressure ulcer of unspecified ankle, unstageable: Secondary | ICD-10-CM

## 2015-07-14 DIAGNOSIS — I872 Venous insufficiency (chronic) (peripheral): Secondary | ICD-10-CM

## 2015-07-15 ENCOUNTER — Ambulatory Visit
Admission: RE | Admit: 2015-07-15 | Discharge: 2015-07-15 | Disposition: A | Discharge status: Home / Self Care | Payer: Medicare Other | Source: Ambulatory Visit | Attending: Internal Medicine | Admitting: Internal Medicine

## 2015-07-15 DIAGNOSIS — M19072 Primary osteoarthritis, left ankle and foot: Secondary | ICD-10-CM | POA: Diagnosis not present

## 2015-07-15 DIAGNOSIS — I872 Venous insufficiency (chronic) (peripheral): Secondary | ICD-10-CM

## 2015-07-15 DIAGNOSIS — L89500 Pressure ulcer of unspecified ankle, unstageable: Secondary | ICD-10-CM

## 2015-07-16 ENCOUNTER — Encounter: Payer: Self-pay | Admitting: Podiatry

## 2015-07-16 ENCOUNTER — Ambulatory Visit (INDEPENDENT_AMBULATORY_CARE_PROVIDER_SITE_OTHER): Payer: Medicare Other | Admitting: Podiatry

## 2015-07-16 VITALS — BP 161/66 | HR 76

## 2015-07-16 DIAGNOSIS — G20 Parkinson's disease: Secondary | ICD-10-CM | POA: Diagnosis not present

## 2015-07-16 DIAGNOSIS — B351 Tinea unguium: Secondary | ICD-10-CM | POA: Diagnosis not present

## 2015-07-16 DIAGNOSIS — M79673 Pain in unspecified foot: Secondary | ICD-10-CM

## 2015-07-16 DIAGNOSIS — M79604 Pain in right leg: Secondary | ICD-10-CM

## 2015-07-16 NOTE — Patient Instructions (Signed)
Seen for hypertrophic nails. All nails debrided. Return in 3 months or as needed.  

## 2015-07-16 NOTE — Progress Notes (Signed)
SUBJECTIVE: 82 y.o. year old female presents aided by walker and accompanied by her husband complaining of pain under the ball of left foot and requesting toe nails trimmed.  She has Parkinson's and having problem getting her nails trimmed.   OBJECTIVE: DERMATOLOGIC EXAMINATION: Hypertrophic nails x 10. Thin shiny skin both feet. VASCULAR EXAMINATION OF LOWER LIMBS: Pedal pulses: Left foot pedal pulses are palpable with normal pulsation. Right foot is not palpable. NEUROLOGIC EXAMINATION OF THE LOWER LIMBS: All epicritic and tactile sensations grossly intact.  MUSCULOSKELETAL EXAMINATION: Prominent 4th MPJ plantar left foot. Thin planta fat pad bilateral.  No gross deformities noted.  ASSESSMENT: Onychomycosis x 10. Loss of plantar fat pad. Pain in lower limbs.  PLAN: All nails debrided. Advised to use thicker padded socks. Return in 3 months or as needed

## 2015-07-17 ENCOUNTER — Encounter: Payer: Self-pay | Admitting: *Deleted

## 2015-07-18 DIAGNOSIS — R2681 Unsteadiness on feet: Secondary | ICD-10-CM | POA: Diagnosis not present

## 2015-07-18 DIAGNOSIS — I5033 Acute on chronic diastolic (congestive) heart failure: Secondary | ICD-10-CM | POA: Diagnosis not present

## 2015-07-18 DIAGNOSIS — G20 Parkinson's disease: Secondary | ICD-10-CM | POA: Diagnosis not present

## 2015-07-18 DIAGNOSIS — M6281 Muscle weakness (generalized): Secondary | ICD-10-CM | POA: Diagnosis not present

## 2015-07-18 DIAGNOSIS — L89522 Pressure ulcer of left ankle, stage 2: Secondary | ICD-10-CM | POA: Diagnosis not present

## 2015-07-21 DIAGNOSIS — G20 Parkinson's disease: Secondary | ICD-10-CM | POA: Diagnosis not present

## 2015-07-21 DIAGNOSIS — S32000D Wedge compression fracture of unspecified lumbar vertebra, subsequent encounter for fracture with routine healing: Secondary | ICD-10-CM | POA: Diagnosis not present

## 2015-07-21 DIAGNOSIS — G2581 Restless legs syndrome: Secondary | ICD-10-CM | POA: Diagnosis not present

## 2015-07-22 ENCOUNTER — Other Ambulatory Visit: Payer: Self-pay | Admitting: Internal Medicine

## 2015-07-23 DIAGNOSIS — G20 Parkinson's disease: Secondary | ICD-10-CM | POA: Diagnosis not present

## 2015-07-23 DIAGNOSIS — L89522 Pressure ulcer of left ankle, stage 2: Secondary | ICD-10-CM | POA: Diagnosis not present

## 2015-07-23 DIAGNOSIS — I5033 Acute on chronic diastolic (congestive) heart failure: Secondary | ICD-10-CM | POA: Diagnosis not present

## 2015-07-23 DIAGNOSIS — R2681 Unsteadiness on feet: Secondary | ICD-10-CM | POA: Diagnosis not present

## 2015-07-23 DIAGNOSIS — M6281 Muscle weakness (generalized): Secondary | ICD-10-CM | POA: Diagnosis not present

## 2015-07-24 ENCOUNTER — Ambulatory Visit (INDEPENDENT_AMBULATORY_CARE_PROVIDER_SITE_OTHER): Payer: Medicare Other | Admitting: Internal Medicine

## 2015-07-24 ENCOUNTER — Encounter: Payer: Self-pay | Admitting: Internal Medicine

## 2015-07-24 VITALS — BP 142/60 | HR 77 | Temp 97.6°F | Ht 61.0 in | Wt 115.0 lb

## 2015-07-24 DIAGNOSIS — M810 Age-related osteoporosis without current pathological fracture: Secondary | ICD-10-CM

## 2015-07-24 DIAGNOSIS — G20A1 Parkinson's disease without dyskinesia, without mention of fluctuations: Secondary | ICD-10-CM

## 2015-07-24 DIAGNOSIS — R296 Repeated falls: Secondary | ICD-10-CM

## 2015-07-24 DIAGNOSIS — G20 Parkinson's disease: Secondary | ICD-10-CM

## 2015-07-24 MED ORDER — DENOSUMAB 60 MG/ML ~~LOC~~ SOLN
60.0000 mg | Freq: Once | SUBCUTANEOUS | Status: AC
  Administered 2015-07-24: 60 mg via SUBCUTANEOUS

## 2015-07-24 NOTE — Progress Notes (Signed)
Location:  [facility] Provider:  Marelin Tat L. Mariea Clonts, D.O., C.M.D.  Code Status: DNR Goals of Care:  Advanced Directives 07/11/2015  Does patient have an advance directive? Yes  Type of Advance Directive Healthcare Power of Attorney  Copy of advanced directive(s) in chart? Yes  Would patient like information on creating an advanced directive? -    Chief Complaint  Patient presents with  . Medical Management of Chronic Issues    discuss prolia    HPI: Patient is a 82 y.o. female seen today for medical management of chronic diseases, primarily to discuss prolia injections.    Drinks milk.  Does not eat cheese or yogurt.  Does take her vitamin D.  Never took any meds for osteoporosis.  Is doing soup can weights.  Will increase yogurt.  Discussed prolia benefits, risks, side effects and answered her daughter's questions.  They agree to the injection.  Past Medical History  Diagnosis Date  . Hypertension   . Parkinson's disease   . Hyperlipidemia   . Dizziness   . Orthostatic hypotension   . Anemia   . Tremor   . Breast cancer ([place])     LEFT BREAST  . Kidney failure     stage 3  . Hypothyroidism   . Prediabetes   . TIA (transient ischemic attack)   . Hyperglycemia   . Left ventricular diastolic dysfunction   . Hypophonia     Past Surgical History  Procedure Laterality Date  . Cardiovascular stress test  06/2007    NORMAL  . Breast lumpectomy  2010  . Cholecystectomy  1994  . Tonsillectomy    . Nasal sinus surgery      submucous resection late 1950s  . Cataract extraction  2005,2006    Dr.Devonzo    Allergies  Allergen Reactions  . Aciphex [Rabeprazole Sodium]     unknown  . Amantadine Other (See Comments)    Pt cannot remember  . Amantadine Hcl     unknown  . Amantadines     unknown  . Avapro [Irbesartan]     unknown  . Carbidopa-Levodopa     unknown  . Cardura [Doxazosin Mesylate]     unknown  . Ciprofloxacin     unknown  . Clonidine Derivatives    unknown  . Cozaar     nightmares  . Diltiazem     unknown  . Famotidine     unknown  . Hctz [Hydrochlorothiazide]     unknown  . Indapamide     unknown  . Lansoprazole     unknown  . Lisinopril     Mouth problems  . Loratadine     unknown  . Maxidex [Dexamethasone]     unknown  . Mirapex [Pramipexole Dihydrochloride]     unknown  . Norvasc [Amlodipine Besylate]     unknown  . Pantoprazole Sodium     unknown  . Prilosec [Omeprazole]     unknown  . Procardia [Nifedipine]     unknown  . Proton Pump Inhibitors     unknown  . Sulfa Drugs Cross Reactors     unknown  . Trimethoprim     unknown  . Zyrtec [Cetirizine Hcl]     unknown  . Metronidazole Rash  . Penicillins Rash    Has patient had a PCN reaction causing immediate rash, facial/tongue/throat swelling, SOB or lightheadedness with hypotension: just a rash all over Has patient had a PCN reaction causing severe rash involving mucus membranes or  skin necrosis: no Has patient had a PCN reaction that required hospitalization-no Has patient had a PCN reaction occurring within the last 10 years: no, more than 50 yrs ago If all of the above answers are "NO", then may proceed with Cephalosporin use.       Medication List       This list is accurate as of: 07/24/15 11:37 AM.  Always use your most recent med list.               aspirin 81 MG tablet  Take 81 mg by mouth every evening.     AZILECT 1 MG Tabs tablet  Generic drug:  rasagiline  Take 1 mg by mouth daily.     BIOTIN PO  Take 1,000 mcg by mouth daily.     clopidogrel 75 MG tablet  Commonly known as:  PLAVIX  Take 75 mg by mouth every evening.     Cranberry 500 MG Caps  Take 500 mg by mouth daily.     docusate sodium 100 MG capsule  Commonly known as:  COLACE  Take 100 mg by mouth daily.     folic acid Q000111Q MCG tablet  Commonly known as:  FOLVITE  Take 800 mcg by mouth daily.     furosemide 40 MG tablet  Commonly known as:  LASIX  Take 1  tablet (40 mg total) by mouth daily.     gabapentin 100 MG capsule  Commonly known as:  NEURONTIN  Take 100 mg by mouth 3 (three) times daily as needed (pain).     metoprolol 100 MG tablet  Commonly known as:  LOPRESSOR  Take 1 tablet (100 mg total) by mouth 2 (two) times daily.     multivitamin with minerals Tabs tablet  Take 1 tablet by mouth daily.     ondansetron 4 MG tablet  Commonly known as:  ZOFRAN  Take 1 tablet (4 mg total) by mouth every 8 (eight) hours as needed for nausea or vomiting.     POLY-IRON 150 150 MG capsule  Generic drug:  iron polysaccharides  Take ONE (1) capsule by mouth daily     PROBIOTIC PO  Take 1 tablet by mouth daily.     Rotigotine 8 MG/24HR Pt24  Place 1 patch onto the skin every evening.     spironolactone 25 MG tablet  Commonly known as:  ALDACTONE  Take 1 tablet (25 mg total) by mouth daily.     SYNTHROID 175 MCG tablet  Generic drug:  levothyroxine  Take ONE (1) tablet by mouth daily before breakfast     SYSTANE OP  Apply 1 drop to eye 2 (two) times daily as needed (dryness).     Vitamin D3 2000 units Tabs  Take 1 tablet by mouth daily.        Review of Systems:  Review of Systems  Constitutional: Negative for fever, chills and malaise/fatigue.  HENT: Negative for congestion.   Eyes:       Glasses  Respiratory: Negative for shortness of breath.   Cardiovascular: Negative for chest pain.  Gastrointestinal: Positive for constipation. Negative for abdominal pain, blood in stool and melena.  Genitourinary: Positive for frequency. Negative for dysuria.  Musculoskeletal: Positive for back pain. Negative for falls.  Neurological: Positive for tingling, tremors, sensory change and weakness. Negative for headaches.  Endo/Heme/Allergies: Bruises/bleeds easily.  Psychiatric/Behavioral: Positive for memory loss.    Health Maintenance  Topic Date Due  . OPHTHALMOLOGY EXAM  02/04/1936  .  PNA vac Low Risk Adult (2 of 2 - PCV13)  05/24/2014  . HEMOGLOBIN A1C  07/08/2015  . MAMMOGRAM  03/08/2048 (Originally 03/08/2012)  . INFLUENZA VACCINE  10/07/2015  . FOOT EXAM  10/08/2015  . URINE MICROALBUMIN  10/11/2015  . TETANUS/TDAP  02/08/2022  . DEXA SCAN  Completed  . ZOSTAVAX  Completed    Physical Exam: Filed Vitals:   07/24/15 1113  BP: 142/60  Pulse: 77  Temp: 97.6 F (36.4 C)  TempSrc: Oral  Height: 5\' 1"  (1.549 m)  Weight: 115 lb (52.164 kg)  SpO2: 94%   Body mass index is 21.74 kg/(m^2). Physical Exam  Constitutional: She is oriented to person, place, and time. She appears well-developed and well-nourished. No distress.  HENT:  HOH  Eyes:  glasses  Cardiovascular: Normal heart sounds and intact distal pulses.   Pulmonary/Chest: Effort normal and breath sounds normal.  Abdominal: Soft. Bowel sounds are normal.  Musculoskeletal: Normal range of motion.  Walks with shuffling gait using rollator walker; kyphosis  Neurological: She is alert and oriented to person, place, and time.  Skin: Skin is warm and dry. There is pallor.  Psychiatric: She has a normal mood and affect.    Labs reviewed: Basic Metabolic Panel:  Recent Labs  01/08/15 0806  02/14/15 0258 04/21/15 0929 06/06/15 0936 06/20/15 1421  NA 139  < > 136 139  --  139  K 4.7  < > 4.1 4.8  --  4.6  CL 99  < > 97* 96  --  99*  CO2 25  < > 35* 25  --  28  GLUCOSE 102*  < > 112* 98  --  134*  BUN 29*  < > 30* 36*  --  35*  CREATININE 0.92  < > 0.95 1.10*  --  1.04*  CALCIUM 9.1  < > 8.5* 9.3  --  9.8  TSH 9.960*  --   --  9.860* 5.620*  --   < > = values in this interval not displayed. Liver Function Tests:  Recent Labs  01/08/15 0806 01/25/15 1151 04/21/15 0929  AST 46* 75* 44*  ALT 46* 78* 36*  ALKPHOS 154* 176* 166*  BILITOT 0.3 0.4 0.4  PROT 6.8 8.4* 7.1  ALBUMIN 3.8 4.2 3.9    Recent Labs  01/25/15 1151  LIPASE 33   No results for input(s): AMMONIA in the last 8760 hours. CBC:  Recent Labs  02/13/15 0418  03/17/15 04/21/15 0929 06/20/15 1421 07/11/15 1000  WBC 5.9 6.4 6.7 6.0 6.1  NEUTROABS 3.4  --  3.7  --  3.4  HGB 10.0* 11.5*  --  11.5*  --   HCT 32.7* 36 36.9 35.9* 35.4  MCV 97.0  --  96 100.3* 97  PLT 127* 127* 148* 125* 132*   Lipid Panel:  Recent Labs  10/08/14 0832 01/08/15 0806  CHOL 106 112  HDL 41 39*  LDLCALC 49 55  TRIG 81 89  CHOLHDL 2.6 2.9   Lab Results  Component Value Date   HGBA1C 7.0* 01/08/2015    Procedures since last visit: Dg Ankle Complete Left  07/15/2015  CLINICAL DATA:  Pressure sore on the left ankle laterally EXAM: LEFT ANKLE COMPLETE - 3+ VIEW COMPARISON:  None. FINDINGS: The ankle joint appears normal. Alignment is normal. No fracture is seen. No erosion or focal demineralization is seen. There is degenerative spurring from the insertion of the Achilles. IMPRESSION: No acute abnormality.  No erosion or focal  demineralization is seen. Electronically Signed   By: Ivar Drape M.D.   On: 07/15/2015 11:41   Dg Foot Complete Left  07/15/2015  CLINICAL DATA:  Pressure sore on the ankle laterally, no injury, diabetes EXAM: LEFT FOOT - COMPLETE 3+ VIEW COMPARISON:  None. FINDINGS: There is degenerative change at the left first MTP joint with loss of joint space and sclerosis. No acute fracture is seen. No erosion is noted and there is no evidence of focal demineralization. IMPRESSION: Degenerative change of the left first MTP joint. No other abnormality. Electronically Signed   By: Ivar Drape M.D.   On: 07/15/2015 11:42    Assessment/Plan 1. Senile osteoporosis - cont vitamin D and increase yogurt in diet to help with calcium as constipation severe with supplements -will begin prolia today for 4 total injections over 2 years, then f/u bone density - denosumab (PROLIA) injection 60 mg; Inject 60 mg into the skin once.  2. Idiopathic Parkinson's disease ([place]) - she and her daughter have requested to change to neurology closer by and in [facility]  system -recommended Dr.Tat to them - Ambulatory referral to Neurology  3. Falls frequently - due to #2, peripheral neuropathy, some arthritic problems in feet -cont use of rollator walker at all times - Ambulatory referral to Neurology  Labs/tests ordered:  Orders Placed This Encounter  Procedures  . Ambulatory referral to Neurology    Referral Priority:  Routine    Referral Type:  Consultation    Referral Reason:  Specialty Services Required    Requested Specialty:  Neurology    Number of Visits Requested:  1    Next appt:  10/24/2015 med Klemme. Chauntae Hults, D.O. [facility] [address], [place] [address] Cell Phone (Mon-Fri 8am-5pm):  [phone] On Call:  [phone] & follow prompts after 5pm & weekends Office Phone:  [phone] Office Fax:  [phone]

## 2015-07-28 ENCOUNTER — Encounter: Payer: Self-pay | Admitting: Neurology

## 2015-07-29 ENCOUNTER — Other Ambulatory Visit: Payer: Self-pay | Admitting: *Deleted

## 2015-07-29 ENCOUNTER — Other Ambulatory Visit: Payer: Self-pay

## 2015-07-29 MED ORDER — CLOPIDOGREL BISULFATE 75 MG PO TABS: 75.0000 mg | ORAL_TABLET | Freq: Every evening | ORAL | Status: DC

## 2015-07-30 DIAGNOSIS — I5033 Acute on chronic diastolic (congestive) heart failure: Secondary | ICD-10-CM | POA: Diagnosis not present

## 2015-07-30 DIAGNOSIS — G20 Parkinson's disease: Secondary | ICD-10-CM | POA: Diagnosis not present

## 2015-07-30 DIAGNOSIS — R2681 Unsteadiness on feet: Secondary | ICD-10-CM | POA: Diagnosis not present

## 2015-07-30 DIAGNOSIS — L89522 Pressure ulcer of left ankle, stage 2: Secondary | ICD-10-CM | POA: Diagnosis not present

## 2015-07-30 DIAGNOSIS — M6281 Muscle weakness (generalized): Secondary | ICD-10-CM | POA: Diagnosis not present

## 2015-07-30 MED ORDER — SPIRONOLACTONE 25 MG PO TABS: 25.0000 mg | ORAL_TABLET | Freq: Every day | ORAL | Status: DC

## 2015-08-04 DIAGNOSIS — E559 Vitamin D deficiency, unspecified: Secondary | ICD-10-CM | POA: Insufficient documentation

## 2015-08-04 DIAGNOSIS — S32000A Wedge compression fracture of unspecified lumbar vertebra, initial encounter for closed fracture: Secondary | ICD-10-CM | POA: Insufficient documentation

## 2015-08-06 DIAGNOSIS — L89522 Pressure ulcer of left ankle, stage 2: Secondary | ICD-10-CM | POA: Diagnosis not present

## 2015-08-06 DIAGNOSIS — M6281 Muscle weakness (generalized): Secondary | ICD-10-CM | POA: Diagnosis not present

## 2015-08-06 DIAGNOSIS — G20 Parkinson's disease: Secondary | ICD-10-CM | POA: Diagnosis not present

## 2015-08-06 DIAGNOSIS — R2681 Unsteadiness on feet: Secondary | ICD-10-CM | POA: Diagnosis not present

## 2015-08-06 DIAGNOSIS — I5033 Acute on chronic diastolic (congestive) heart failure: Secondary | ICD-10-CM | POA: Diagnosis not present

## 2015-08-08 ENCOUNTER — Ambulatory Visit: Payer: Medicare Other | Admitting: Cardiology

## 2015-08-12 DIAGNOSIS — L89522 Pressure ulcer of left ankle, stage 2: Secondary | ICD-10-CM | POA: Diagnosis not present

## 2015-08-12 DIAGNOSIS — I5033 Acute on chronic diastolic (congestive) heart failure: Secondary | ICD-10-CM | POA: Diagnosis not present

## 2015-08-12 DIAGNOSIS — R2681 Unsteadiness on feet: Secondary | ICD-10-CM | POA: Diagnosis not present

## 2015-08-12 DIAGNOSIS — M6281 Muscle weakness (generalized): Secondary | ICD-10-CM | POA: Diagnosis not present

## 2015-08-12 DIAGNOSIS — G20 Parkinson's disease: Secondary | ICD-10-CM | POA: Diagnosis not present

## 2015-08-15 ENCOUNTER — Telehealth: Payer: Self-pay

## 2015-08-15 NOTE — Telephone Encounter (Signed)
Message left on triage voicemail form [facility] requesting verbal orders for OT x 2 weeks while patient's daughter is out of town (patient daughter does OT herself when she is here).  Last OV 07-24-15 Pending OV 10-24-15  Dr.Reed please advise if ok to give order

## 2015-08-15 NOTE — Telephone Encounter (Signed)
Per Dr.Reed ok to give verbal orders, Ivin Booty informed

## 2015-08-18 DIAGNOSIS — M6281 Muscle weakness (generalized): Secondary | ICD-10-CM | POA: Diagnosis not present

## 2015-08-18 DIAGNOSIS — L89522 Pressure ulcer of left ankle, stage 2: Secondary | ICD-10-CM | POA: Diagnosis not present

## 2015-08-18 DIAGNOSIS — R2681 Unsteadiness on feet: Secondary | ICD-10-CM | POA: Diagnosis not present

## 2015-08-18 DIAGNOSIS — I5033 Acute on chronic diastolic (congestive) heart failure: Secondary | ICD-10-CM | POA: Diagnosis not present

## 2015-08-18 DIAGNOSIS — G20 Parkinson's disease: Secondary | ICD-10-CM | POA: Diagnosis not present

## 2015-08-19 DIAGNOSIS — R2681 Unsteadiness on feet: Secondary | ICD-10-CM | POA: Diagnosis not present

## 2015-08-19 DIAGNOSIS — L89522 Pressure ulcer of left ankle, stage 2: Secondary | ICD-10-CM | POA: Diagnosis not present

## 2015-08-19 DIAGNOSIS — M6281 Muscle weakness (generalized): Secondary | ICD-10-CM | POA: Diagnosis not present

## 2015-08-19 DIAGNOSIS — G20 Parkinson's disease: Secondary | ICD-10-CM | POA: Diagnosis not present

## 2015-08-19 DIAGNOSIS — I5033 Acute on chronic diastolic (congestive) heart failure: Secondary | ICD-10-CM | POA: Diagnosis not present

## 2015-08-20 DIAGNOSIS — L89522 Pressure ulcer of left ankle, stage 2: Secondary | ICD-10-CM | POA: Diagnosis not present

## 2015-08-20 DIAGNOSIS — G20 Parkinson's disease: Secondary | ICD-10-CM | POA: Diagnosis not present

## 2015-08-20 DIAGNOSIS — M6281 Muscle weakness (generalized): Secondary | ICD-10-CM | POA: Diagnosis not present

## 2015-08-20 DIAGNOSIS — I5033 Acute on chronic diastolic (congestive) heart failure: Secondary | ICD-10-CM | POA: Diagnosis not present

## 2015-08-20 DIAGNOSIS — R2681 Unsteadiness on feet: Secondary | ICD-10-CM | POA: Diagnosis not present

## 2015-08-21 DIAGNOSIS — R2681 Unsteadiness on feet: Secondary | ICD-10-CM | POA: Diagnosis not present

## 2015-08-21 DIAGNOSIS — M6281 Muscle weakness (generalized): Secondary | ICD-10-CM | POA: Diagnosis not present

## 2015-08-21 DIAGNOSIS — L89522 Pressure ulcer of left ankle, stage 2: Secondary | ICD-10-CM | POA: Diagnosis not present

## 2015-08-21 DIAGNOSIS — G20 Parkinson's disease: Secondary | ICD-10-CM | POA: Diagnosis not present

## 2015-08-21 DIAGNOSIS — I5033 Acute on chronic diastolic (congestive) heart failure: Secondary | ICD-10-CM | POA: Diagnosis not present

## 2015-08-25 DIAGNOSIS — G20 Parkinson's disease: Secondary | ICD-10-CM | POA: Diagnosis not present

## 2015-08-25 DIAGNOSIS — R2681 Unsteadiness on feet: Secondary | ICD-10-CM | POA: Diagnosis not present

## 2015-08-25 DIAGNOSIS — I5033 Acute on chronic diastolic (congestive) heart failure: Secondary | ICD-10-CM | POA: Diagnosis not present

## 2015-08-25 DIAGNOSIS — M6281 Muscle weakness (generalized): Secondary | ICD-10-CM | POA: Diagnosis not present

## 2015-08-25 DIAGNOSIS — L89522 Pressure ulcer of left ankle, stage 2: Secondary | ICD-10-CM | POA: Diagnosis not present

## 2015-08-26 DIAGNOSIS — M6281 Muscle weakness (generalized): Secondary | ICD-10-CM | POA: Diagnosis not present

## 2015-08-26 DIAGNOSIS — L89522 Pressure ulcer of left ankle, stage 2: Secondary | ICD-10-CM | POA: Diagnosis not present

## 2015-08-26 DIAGNOSIS — I5033 Acute on chronic diastolic (congestive) heart failure: Secondary | ICD-10-CM | POA: Diagnosis not present

## 2015-08-26 DIAGNOSIS — R2681 Unsteadiness on feet: Secondary | ICD-10-CM | POA: Diagnosis not present

## 2015-08-26 DIAGNOSIS — G20 Parkinson's disease: Secondary | ICD-10-CM | POA: Diagnosis not present

## 2015-08-27 DIAGNOSIS — L89522 Pressure ulcer of left ankle, stage 2: Secondary | ICD-10-CM | POA: Diagnosis not present

## 2015-08-27 DIAGNOSIS — R2681 Unsteadiness on feet: Secondary | ICD-10-CM | POA: Diagnosis not present

## 2015-08-27 DIAGNOSIS — M6281 Muscle weakness (generalized): Secondary | ICD-10-CM | POA: Diagnosis not present

## 2015-08-27 DIAGNOSIS — G20 Parkinson's disease: Secondary | ICD-10-CM | POA: Diagnosis not present

## 2015-08-27 DIAGNOSIS — I5033 Acute on chronic diastolic (congestive) heart failure: Secondary | ICD-10-CM | POA: Diagnosis not present

## 2015-08-29 DIAGNOSIS — G20 Parkinson's disease: Secondary | ICD-10-CM | POA: Diagnosis not present

## 2015-08-29 DIAGNOSIS — R2681 Unsteadiness on feet: Secondary | ICD-10-CM | POA: Diagnosis not present

## 2015-08-29 DIAGNOSIS — I5033 Acute on chronic diastolic (congestive) heart failure: Secondary | ICD-10-CM | POA: Diagnosis not present

## 2015-08-29 DIAGNOSIS — L89522 Pressure ulcer of left ankle, stage 2: Secondary | ICD-10-CM | POA: Diagnosis not present

## 2015-08-29 DIAGNOSIS — M6281 Muscle weakness (generalized): Secondary | ICD-10-CM | POA: Diagnosis not present

## 2015-09-01 ENCOUNTER — Encounter: Payer: Self-pay | Admitting: Cardiology

## 2015-09-01 ENCOUNTER — Ambulatory Visit (INDEPENDENT_AMBULATORY_CARE_PROVIDER_SITE_OTHER): Payer: Medicare Other | Admitting: Cardiology

## 2015-09-01 ENCOUNTER — Ambulatory Visit: Payer: Medicare Other | Admitting: Cardiology

## 2015-09-01 VITALS — BP 136/68 | HR 78 | Ht 61.0 in | Wt 115.0 lb

## 2015-09-01 DIAGNOSIS — I519 Heart disease, unspecified: Secondary | ICD-10-CM | POA: Diagnosis not present

## 2015-09-01 DIAGNOSIS — G20 Parkinson's disease: Secondary | ICD-10-CM | POA: Diagnosis not present

## 2015-09-01 DIAGNOSIS — R0600 Dyspnea, unspecified: Secondary | ICD-10-CM

## 2015-09-01 DIAGNOSIS — Z8673 Personal history of transient ischemic attack (TIA), and cerebral infarction without residual deficits: Secondary | ICD-10-CM

## 2015-09-01 DIAGNOSIS — R2681 Unsteadiness on feet: Secondary | ICD-10-CM | POA: Diagnosis not present

## 2015-09-01 DIAGNOSIS — I5189 Other ill-defined heart diseases: Secondary | ICD-10-CM

## 2015-09-01 DIAGNOSIS — M6281 Muscle weakness (generalized): Secondary | ICD-10-CM | POA: Diagnosis not present

## 2015-09-01 DIAGNOSIS — L89522 Pressure ulcer of left ankle, stage 2: Secondary | ICD-10-CM | POA: Diagnosis not present

## 2015-09-01 DIAGNOSIS — I5033 Acute on chronic diastolic (congestive) heart failure: Secondary | ICD-10-CM | POA: Diagnosis not present

## 2015-09-01 NOTE — Patient Instructions (Signed)

## 2015-09-01 NOTE — Progress Notes (Signed)
Cardiology Office Note    Date:  09/01/2015   ID:  Victoria Lindsey, Victoria Lindsey [birth year], MRN [id number]  PCP:  Hollace Kinnier, DO  Cardiologist:   Candee Furbish, MD     History of Present Illness:  Victoria Lindsey is a 82 y.o. female former patient of Dr. Sherryl Barters here for follow-up. She has a history of possible TIA hospitalization in October 2015. Labile blood pressure. She also has a history of Parkinson's disease and Sjogren's syndrome.  She does not have any known ischemic heart disease and had a normal nuclear stress test in 2009. An echocardiogram showed mild aortic regurgitation and mild mitral regurgitation.  She had possible aspiration in October during her hospitalization while in the MRI scanner. There was concern for aspiration. She was on the ventilator transiently.  Overall she's been doing quite well. She frequently uses the bathroom. Blood work has been very stable over the last few checks. Potassium has ranged from 4.4-5.0. She is on spironolactone.  No syncope, no chest pain, no orthopnea, no PND. Her voice is weak.  Past Medical History  Diagnosis Date  . Hypertension   . Parkinson's disease   . Hyperlipidemia   . Dizziness   . Orthostatic hypotension   . Anemia   . Tremor   . Breast cancer ([place])     LEFT BREAST  . Kidney failure     stage 3  . Hypothyroidism   . Prediabetes   . TIA (transient ischemic attack)   . Hyperglycemia   . Left ventricular diastolic dysfunction   . Hypophonia     Past Surgical History  Procedure Laterality Date  . Cardiovascular stress test  06/2007    NORMAL  . Breast lumpectomy  2010  . Cholecystectomy  1994  . Tonsillectomy    . Nasal sinus surgery      submucous resection late 1950s  . Cataract extraction  2005,2006    Dr.Devonzo    Current Medications: Outpatient Prescriptions Prior to Visit  Medication Sig Dispense Refill  . aspirin 81 MG tablet Take 81 mg by mouth every evening.     Marland Kitchen BIOTIN PO Take 1,000 mcg  by mouth daily.     . Cholecalciferol (VITAMIN D3) 2000 UNITS TABS Take 1 tablet by mouth daily.     . clopidogrel (PLAVIX) 75 MG tablet Take 1 tablet (75 mg total) by mouth every evening. 90 tablet 2  . Cranberry 500 MG CAPS Take 500 mg by mouth daily.     Marland Kitchen docusate sodium (COLACE) 100 MG capsule Take 100 mg by mouth daily.     . folic acid (FOLVITE) Q000111Q MCG tablet Take 800 mcg by mouth daily.     . furosemide (LASIX) 40 MG tablet Take 1 tablet (40 mg total) by mouth daily. 90 tablet 1  . gabapentin (NEURONTIN) 100 MG capsule Take 100 mg by mouth at bedtime.     . metoprolol (LOPRESSOR) 100 MG tablet Take 1 tablet (100 mg total) by mouth 2 (two) times daily. 180 tablet 1  . Multiple Vitamin (MULTIVITAMIN WITH MINERALS) TABS tablet Take 1 tablet by mouth daily.    Marland Kitchen POLY-IRON 150 150 MG capsule Take ONE (1) capsule by mouth daily 30 capsule 1  . Polyethyl Glycol-Propyl Glycol (SYSTANE OP) Apply 1 drop to eye 2 (two) times daily as needed (dryness).    . rasagiline (AZILECT) 1 MG TABS Take 1 mg by mouth daily.    . Rotigotine 8 MG/24HR PT24 Place  1 patch onto the skin every evening.     Marland Kitchen spironolactone (ALDACTONE) 25 MG tablet Take 1 tablet (25 mg total) by mouth daily. 90 tablet 1  . SYNTHROID 175 MCG tablet Take ONE (1) tablet by mouth daily before breakfast 30 tablet 3  . ondansetron (ZOFRAN) 4 MG tablet Take 1 tablet (4 mg total) by mouth every 8 (eight) hours as needed for nausea or vomiting. 10 tablet 0  . Probiotic Product (PROBIOTIC PO) Take 1 tablet by mouth daily.     No facility-administered medications prior to visit.     Allergies:   Aciphex; Amantadine; Amantadine hcl; Amantadines; Avapro; Carbidopa-levodopa; Cardura; Ciprofloxacin; Clonidine derivatives; Cozaar; Diltiazem; Famotidine; Hctz; Indapamide; Lansoprazole; Lisinopril; Loratadine; Maxidex; Mirapex; Norvasc; Pantoprazole sodium; Prilosec; Procardia; Proton pump inhibitors; Sulfa drugs cross reactors; Trimethoprim;  Zyrtec; Metronidazole; and Penicillins   Social History   Social History  . Marital Status: Married    Spouse Name: N/A  . Number of Children: N/A  . Years of Education: N/A   Occupational History  . retired    Social History Main Topics  . Smoking status: Former Smoker -- 0.50 packs/day for 12 years    Types: Cigarettes    Quit date: 03/08/1964  . Smokeless tobacco: Never Used  . Alcohol Use: No  . Drug Use: No  . Sexual Activity: Not Asked   Other Topics Concern  . None   Social History Narrative   Quit smoking in 1967   Caffeine: Tea and chocolate    Married since 1951   Lives in a house, 1 stories, 2 persons, no pets   Current or past profession- homemaker   Exercise- Yes, Physical Therapy, Occupational Therapy   Living Will-Yes   POA/HPOA-Yes      Family History:  The patient's family history includes COPD in her sister; Cancer in her brother; Diabetes in her father; Heart disease in her mother; Heart failure in her father; Osteoporosis in her daughter; Parkinsonism in her father; Stroke in her sister.   ROS:   Please see the history of present illness.    ROS All other systems reviewed and are negative.   PHYSICAL EXAM:   VS:  BP 136/68 mmHg  Pulse 78  Ht 5\' 1"  (1.549 m)  Wt 115 lb (52.164 kg)  BMI 21.74 kg/m2  SpO2 92%   GEN: Thin, in no acute distressUses a walker for ambulation HEENT: normal Neck: no JVD, carotid bruits, or masses Cardiac: RRR; no murmurs, rubs, or gallops,no edema  Respiratory:  clear to auscultation bilaterally, normal work of breathing GI: soft, nontender, nondistended, + BS MS: no deformity or atrophy Skin: warm and dry, no rash Neuro:  Alert and Oriented x 3, Strength and sensation are intact Psych: euthymic mood, full affect  Wt Readings from Last 3 Encounters:  09/01/15 115 lb (52.164 kg)  07/24/15 115 lb (52.164 kg)  07/11/15 115 lb (52.164 kg)      Studies/Labs Reviewed:   EKG:  None today  Recent  Labs: 02/12/2015: B Natriuretic Peptide 1814.9* 04/21/2015: ALT 36* 06/06/2015: TSH 5.620* 06/20/2015: BUN 35*; Creatinine, Ser 1.04*; Hemoglobin 11.5*; Potassium 4.6; Sodium 139 07/11/2015: Platelets 132*   Lipid Panel    Component Value Date/Time   CHOL 112 01/08/2015 0806   CHOL 99 12/22/2013 0239   TRIG 89 01/08/2015 0806   HDL 39* 01/08/2015 0806   HDL 41 12/22/2013 0239   CHOLHDL 2.9 01/08/2015 0806   CHOLHDL 2.4 12/22/2013 0239   VLDL 27 12/22/2013  [place] 01/08/2015 0806   LDLCALC 31 12/22/2013 0239   LDLDIRECT 65.1 12/27/2011 1001    Additional studies/ records that were reviewed today include:  Prior office notes reviewed, lab work reviewed, testing reviewed.  Echocardiogram 02/13/15-EF Q000111Q, grade 1 diastolic dysfunction, mild aortic insufficiency.  ASSESSMENT:    1. Left ventricular diastolic dysfunction with preserved systolic function ([place])   2. Dyspnea   3. Parkinson's disease ([place])   4. History of TIA (transient ischemic attack)      PLAN:  In order of problems listed above:  History of TIA  - On antiplatelet therapy as above.  Essential hypertension  - Currently controlled but has been labile in the past.  Left ventricular diastolic dysfunction  - Hospital stay in December 2016, responded to IV Lasix.  - She states that she has had dyspnea for years.  - She has chronic spironolactone therapy. Blood work has been closely monitored. Stable.  Parkinson's  - [facility]. She thinks that her neurologist may have left town. I did encourage her to seek a new neurologist.     Medication Adjustments/Labs and Tests Ordered: Current medicines are reviewed at length with the patient today.  Concerns regarding medicines are outlined above.  Medication changes, Labs and Tests ordered today are listed in the Patient Instructions below. Patient Instructions  Medication Instructions:  The current medical regimen is effective;   continue present plan and medications.  Follow-Up: Follow up in 6 months with Dr. Marlou Porch.  You will receive a letter in the mail 2 months before you are due.  Please call us when you receive this letter to schedule your follow up appointment.  If you need a refill on your cardiac medications before your next appointment, please call your pharmacy.  Thank you for choosing [facility]!!          Signed, Candee Furbish, MD  09/01/2015 4:34 PM    [address], [place], [place]  [address] Phone: [phone]; Fax: [phone]

## 2015-09-02 ENCOUNTER — Ambulatory Visit (INDEPENDENT_AMBULATORY_CARE_PROVIDER_SITE_OTHER): Payer: Medicare Other | Admitting: Nurse Practitioner

## 2015-09-02 ENCOUNTER — Encounter: Payer: Self-pay | Admitting: Nurse Practitioner

## 2015-09-02 VITALS — BP 124/72 | HR 74 | Temp 97.7°F | Resp 17 | Ht 61.0 in | Wt 116.4 lb

## 2015-09-02 DIAGNOSIS — S51011A Laceration without foreign body of right elbow, initial encounter: Secondary | ICD-10-CM

## 2015-09-02 DIAGNOSIS — W1809XA Striking against other object with subsequent fall, initial encounter: Secondary | ICD-10-CM

## 2015-09-02 NOTE — Progress Notes (Signed)
Patient ID: EUGENIA RARDON, female   DOB: [birth year], 82 y.o.   MRN: [id number]    PCP: Hollace Kinnier, DO  Advanced Directive information Does patient have an advance directive?: Yes, Type of Advance Directive: [place];Living will, Does patient want to make changes to advanced directive?: No - Patient declined  Allergies  Allergen Reactions  . Aciphex [Rabeprazole Sodium]     unknown  . Amantadine Other (See Comments)    Pt cannot remember  . Amantadine Hcl     unknown  . Amantadines     unknown  . Avapro [Irbesartan]     unknown  . Carbidopa-Levodopa     unknown  . Cardura [Doxazosin Mesylate]     unknown  . Ciprofloxacin     unknown  . Clonidine Derivatives     unknown  . Cozaar     nightmares  . Diltiazem     unknown  . Famotidine     unknown  . Hctz [Hydrochlorothiazide]     unknown  . Indapamide     unknown  . Lansoprazole     unknown  . Lisinopril     Mouth problems  . Loratadine     unknown  . Maxidex [Dexamethasone]     unknown  . Mirapex [Pramipexole Dihydrochloride]     unknown  . Norvasc [Amlodipine Besylate]     unknown  . Pantoprazole Sodium     unknown  . Prilosec [Omeprazole]     unknown  . Procardia [Nifedipine]     unknown  . Proton Pump Inhibitors     unknown  . Sulfa Drugs Cross Reactors     unknown  . Trimethoprim     unknown  . Zyrtec [Cetirizine Hcl]     unknown  . Metronidazole Rash  . Penicillins Rash    Has patient had a PCN reaction causing immediate rash, facial/tongue/throat swelling, SOB or lightheadedness with hypotension: just a rash all over Has patient had a PCN reaction causing severe rash involving mucus membranes or skin necrosis: no Has patient had a PCN reaction that required hospitalization-no Has patient had a PCN reaction occurring within the last 10 years: no, more than 50 yrs ago If all of the above answers are "NO", then may proceed with Cephalosporin use.     Chief Complaint    Patient presents with  . Acute Visit    [place] and hit head on closet door, banged arm on toy panio.      HPI: Patient is a 82 y.o. female seen in the office today due to fall. Pt with hx of balance issues due to parkinson disease and has posterior lean. Lost her balance and fell back and hit the crown of her head and her right arm on the closet door and a small piano Daughter with her today.  Pt with chronic dizziness, no changes with this. Did not have any LOC or AMS. No lethargy, no nausea or vomiting. No slurred speech or change in vision. No gait changes.  Has frequent falls in the past.    Review of Systems:  Review of Systems  Constitutional: Negative for fever, chills, activity change and appetite change.  HENT: Negative for congestion.   Eyes:       Glasses  Respiratory: Negative for shortness of breath.   Cardiovascular: Negative for chest pain.  Gastrointestinal: Positive for constipation. Negative for abdominal pain and blood in stool.  Genitourinary: Negative for dysuria.  Musculoskeletal: Positive for gait  problem. Negative for myalgias, back pain and joint swelling.  Skin:       Bruising   Neurological: Positive for tremors and weakness. Negative for dizziness, syncope, facial asymmetry, speech difficulty, light-headedness and headaches.  Hematological: Bruises/bleeds easily.  Psychiatric/Behavioral: Negative for behavioral problems, confusion and agitation. The patient is not nervous/anxious.     Past Medical History  Diagnosis Date  . Hypertension   . Parkinson's disease   . Hyperlipidemia   . Dizziness   . Orthostatic hypotension   . Anemia   . Tremor   . Breast cancer ([place])     LEFT BREAST  . Kidney failure     stage 3  . Hypothyroidism   . Prediabetes   . TIA (transient ischemic attack)   . Hyperglycemia   . Left ventricular diastolic dysfunction   . Hypophonia    Past Surgical History  Procedure Laterality Date  . Cardiovascular stress test   06/2007    NORMAL  . Breast lumpectomy  2010  . Cholecystectomy  1994  . Tonsillectomy    . Nasal sinus surgery      submucous resection late 1950s  . Cataract extraction  2005,2006    Dr.Devonzo   Social History:   reports that she quit smoking about 51 years ago. Her smoking use included Cigarettes. She has a 6 pack-year smoking history. She has never used smokeless tobacco. She reports that she does not drink alcohol or use illicit drugs.  Family History  Problem Relation Age of Onset  . Heart disease Mother   . Heart failure Father   . Parkinsonism Father   . COPD Sister   . Cancer Brother   . Osteoporosis Daughter   . Diabetes Father   . Stroke Sister     Medications: Patient's Medications  New Prescriptions   No medications on file  Previous Medications   ASPIRIN 81 MG TABLET    Take 81 mg by mouth every evening.    BIOTIN PO    Take 1,000 mcg by mouth daily.    CHOLECALCIFEROL (VITAMIN D3) 2000 UNITS TABS    Take 1 tablet by mouth daily.    CLOPIDOGREL (PLAVIX) 75 MG TABLET    Take 1 tablet (75 mg total) by mouth every evening.   CRANBERRY 500 MG CAPS    Take 500 mg by mouth daily.    DOCUSATE SODIUM (COLACE) 100 MG CAPSULE    Take 100 mg by mouth daily.    FOLIC ACID (FOLVITE) Q000111Q MCG TABLET    Take 800 mcg by mouth daily.    FUROSEMIDE (LASIX) 40 MG TABLET    Take 1 tablet (40 mg total) by mouth daily.   GABAPENTIN (NEURONTIN) 100 MG CAPSULE    Take 100 mg by mouth at bedtime.    METOPROLOL (LOPRESSOR) 100 MG TABLET    Take 1 tablet (100 mg total) by mouth 2 (two) times daily.   MULTIPLE VITAMIN (MULTIVITAMIN WITH MINERALS) TABS TABLET    Take 1 tablet by mouth daily.   POLY-IRON 150 150 MG CAPSULE    Take ONE (1) capsule by mouth daily   POLYETHYL GLYCOL-PROPYL GLYCOL (SYSTANE OP)    Apply 1 drop to eye 2 (two) times daily as needed (dryness).   RASAGILINE (AZILECT) 1 MG TABS    Take 1 mg by mouth daily.   ROTIGOTINE 8 MG/24HR PT24    Place 1 patch onto the skin  every evening.    SPIRONOLACTONE (ALDACTONE) 25 MG TABLET  Take 1 tablet (25 mg total) by mouth daily.   SYNTHROID 175 MCG TABLET    Take ONE (1) tablet by mouth daily before breakfast  Modified Medications   No medications on file  Discontinued Medications   No medications on file     Physical Exam:  Filed Vitals:   09/02/15 1445  BP: 124/72  Pulse: 74  Temp: 97.7 F (36.5 C)  TempSrc: Oral  Resp: 17  Height: 5\' 1"  (1.549 m)  Weight: 116 lb 6.4 oz (52.799 kg)  SpO2: 94%   Body mass index is 22.01 kg/(m^2).  Physical Exam  Constitutional: She is oriented to person, place, and time. She appears well-developed and well-nourished. No distress.  HENT:  HOH  Eyes: Conjunctivae and EOM are normal. Pupils are equal, round, and reactive to light.  glasses  Neck: Normal range of motion. Neck supple.  Cardiovascular: Normal rate, regular rhythm and normal heart sounds.   Pulmonary/Chest: Effort normal and breath sounds normal.  Abdominal: Soft. Bowel sounds are normal.  Musculoskeletal: Normal range of motion. She exhibits no edema or tenderness.  Walks with shuffling gait using rollator walker; kyphosis  Neurological: She is alert and oriented to person, place, and time.  Skin: Skin is warm and dry. There is pallor.  ecchymosis to right upper arm with 2 skin tears  Small knot to crown of head with mild ecchymosis   Psychiatric: She has a normal mood and affect.    Labs reviewed: Basic Metabolic Panel:  Recent Labs  01/08/15 0806  02/14/15 0258 04/21/15 0929 06/06/15 0936 06/20/15 1421  NA 139  < > 136 139  --  139  K 4.7  < > 4.1 4.8  --  4.6  CL 99  < > 97* 96  --  99*  CO2 25  < > 35* 25  --  28  GLUCOSE 102*  < > 112* 98  --  134*  BUN 29*  < > 30* 36*  --  35*  CREATININE 0.92  < > 0.95 1.10*  --  1.04*  CALCIUM 9.1  < > 8.5* 9.3  --  9.8  TSH 9.960*  --   --  9.860* 5.620*  --   < > = values in this interval not displayed. Liver Function Tests:  Recent  Labs  01/08/15 0806 01/25/15 1151 04/21/15 0929  AST 46* 75* 44*  ALT 46* 78* 36*  ALKPHOS 154* 176* 166*  BILITOT 0.3 0.4 0.4  PROT 6.8 8.4* 7.1  ALBUMIN 3.8 4.2 3.9    Recent Labs  01/25/15 1151  LIPASE 33   No results for input(s): AMMONIA in the last 8760 hours. CBC:  Recent Labs  02/13/15 0418 03/17/15 04/21/15 0929 06/20/15 1421 07/11/15 1000  WBC 5.9 6.4 6.7 6.0 6.1  NEUTROABS 3.4  --  3.7  --  3.4  HGB 10.0* 11.5*  --  11.5*  --   HCT 32.7* 36 36.9 35.9* 35.4  MCV 97.0  --  96 100.3* 97  PLT 127* 127* 148* 125* 132*   Lipid Panel:  Recent Labs  10/08/14 0832 01/08/15 0806  CHOL 106 112  HDL 41 39*  LDLCALC 49 55  TRIG 81 89  CHOLHDL 2.6 2.9   TSH:  Recent Labs  01/08/15 0806 04/21/15 0929 06/06/15 0936  TSH 9.960* 9.860* 5.620*   A1C: Lab Results  Component Value Date   HGBA1C 7.0* 01/08/2015     Assessment/Plan 1. Skin tear of elbow without  complication, right, initial encounter Xeroform applied with kurlex, to change every other day and as needed, to call if drainage, heat or increase pain occurs  2. Fall against object, initial encounter -pt with multiple falls, currently neurologically intact, no changes of mental status  -to cont to use walker and take caution to avoid further falls.  -daughter to provide ongoing monitoring and notify of changes  Follow up discussed with pt and daughter    Carlos American. Harle Battiest  [facility] [phone] 8 am - 5 pm) [phone] (after hours)

## 2015-09-02 NOTE — Patient Instructions (Addendum)
Skin Tear Care A skin tear is a wound in which the top layer of skin has peeled off. This is a common problem with aging because the skin becomes thinner and more fragile as a person gets older. In addition, some medicines, such as oral corticosteroids, can lead to skin thinning if taken for long periods of time.  A skin tear is often repaired with tape or skin adhesive strips. This keeps the skin that has been peeled off in contact with the healthier skin beneath. Depending on the location of the wound, a bandage (dressing) may be applied over the tape or skin adhesive strips. Sometimes, during the healing process, the skin turns black and dies. Even when this happens, the torn skin acts as a good dressing until the skin underneath gets healthier and repairs itself. HOME CARE INSTRUCTIONS   Change dressings once per day or as directed by your caregiver.  Gently clean the skin tear and the area around the tear using saline solution or mild soap and water.  Do not rub the injured skin dry. Let the area air dry.  Apply petroleum jelly or an antibiotic cream or ointment to keep the tear moist. This will help the wound heal. Do not allow a scab to form.  If the dressing sticks before the next dressing change, moisten it with warm soapy water and gently remove it.  Protect the injured skin until it has healed.  Only take over-the-counter or prescription medicines as directed by your caregiver.  Take showers or baths using warm soapy water. Apply a new dressing after the shower or bath.  Keep all follow-up appointments as directed by your caregiver.  SEEK IMMEDIATE MEDICAL CARE IF:   You have redness, swelling, or increasing pain in the skin tear.  You havepus coming from the skin tear.  You have chills.  You have a red streak that goes away from the skin tear.  You have a bad smell coming from the tear or dressing.  You have a fever or persistent symptoms for more than 2-3 days.  You  have a fever and your symptoms suddenly get worse. MAKE SURE YOU:  Understand these instructions.  Will watch this condition.  Will get help right away if your child is not doing well or gets worse.   This information is not intended to replace advice given to you by your health care provider. Make sure you discuss any questions you have with your health care provider.   Document Released: 11/17/2000 Document Revised: 11/17/2011 Document Reviewed: 09/06/2011 Elsevier Interactive Patient Education 2016 [place] A contusion is a deep bruise. Contusions are the result of a blunt injury to tissues and muscle fibers under the skin. The injury causes bleeding under the skin. The skin overlying the contusion may turn blue, purple, or yellow. Minor injuries will give you a painless contusion, but more severe contusions may stay painful and swollen for a few weeks.  CAUSES  This condition is usually caused by a blow, trauma, or direct force to an area of the body. SYMPTOMS  Symptoms of this condition include:  Swelling of the injured area.  Pain and tenderness in the injured area.  Discoloration. The area may have redness and then turn blue, purple, or yellow. DIAGNOSIS  This condition is diagnosed based on a physical exam and medical history. An X-ray, CT scan, or MRI may be needed to determine if there are any associated injuries, such as broken bones (fractures).  TREATMENT  Specific treatment for this condition depends on what area of the body was injured. In general, the best treatment for a contusion is resting, icing, applying pressure to (compression), and elevating the injured area. This is often called the RICE strategy. Over-the-counter anti-inflammatory medicines may also be recommended for pain control.  HOME CARE INSTRUCTIONS   Rest the injured area.  If directed, apply ice to the injured area:  Put ice in a plastic bag.  Place a towel between your skin  and the bag.  Leave the ice on for 20 minutes, 2-3 times per day.  If directed, apply light compression to the injured area using an elastic bandage. Make sure the bandage is not wrapped too tightly. Remove and reapply the bandage as directed by your health care provider.  If possible, raise (elevate) the injured area above the level of your heart while you are sitting or lying down.  Take over-the-counter and prescription medicines only as told by your health care provider. SEEK MEDICAL CARE IF:  Your symptoms do not improve after several days of treatment.  Your symptoms get worse.  You have difficulty moving the injured area. SEEK IMMEDIATE MEDICAL CARE IF:   You have severe pain.  You have numbness in a hand or foot.  Your hand or foot turns pale or cold.   This information is not intended to replace advice given to you by your health care provider. Make sure you discuss any questions you have with your health care provider.   Document Released: 12/02/2004 Document Revised: 11/13/2014 Document Reviewed: 07/10/2014 Elsevier Interactive Patient Education [employer].

## 2015-09-03 DIAGNOSIS — G20 Parkinson's disease: Secondary | ICD-10-CM | POA: Diagnosis not present

## 2015-09-03 DIAGNOSIS — L89522 Pressure ulcer of left ankle, stage 2: Secondary | ICD-10-CM | POA: Diagnosis not present

## 2015-09-03 DIAGNOSIS — R2681 Unsteadiness on feet: Secondary | ICD-10-CM | POA: Diagnosis not present

## 2015-09-03 DIAGNOSIS — I5033 Acute on chronic diastolic (congestive) heart failure: Secondary | ICD-10-CM | POA: Diagnosis not present

## 2015-09-03 DIAGNOSIS — M6281 Muscle weakness (generalized): Secondary | ICD-10-CM | POA: Diagnosis not present

## 2015-09-10 ENCOUNTER — Encounter: Payer: Self-pay | Admitting: Neurology

## 2015-09-10 ENCOUNTER — Ambulatory Visit (INDEPENDENT_AMBULATORY_CARE_PROVIDER_SITE_OTHER): Payer: Medicare Other | Admitting: Neurology

## 2015-09-10 VITALS — BP 120/60 | HR 78 | Ht 61.0 in | Wt 112.0 lb

## 2015-09-10 DIAGNOSIS — F458 Other somatoform disorders: Secondary | ICD-10-CM

## 2015-09-10 DIAGNOSIS — L89522 Pressure ulcer of left ankle, stage 2: Secondary | ICD-10-CM | POA: Diagnosis not present

## 2015-09-10 DIAGNOSIS — K117 Disturbances of salivary secretion: Secondary | ICD-10-CM

## 2015-09-10 DIAGNOSIS — R1319 Other dysphagia: Secondary | ICD-10-CM

## 2015-09-10 DIAGNOSIS — I5033 Acute on chronic diastolic (congestive) heart failure: Secondary | ICD-10-CM | POA: Diagnosis not present

## 2015-09-10 DIAGNOSIS — G20 Parkinson's disease: Secondary | ICD-10-CM | POA: Diagnosis not present

## 2015-09-10 DIAGNOSIS — M6281 Muscle weakness (generalized): Secondary | ICD-10-CM | POA: Diagnosis not present

## 2015-09-10 DIAGNOSIS — R2681 Unsteadiness on feet: Secondary | ICD-10-CM | POA: Diagnosis not present

## 2015-09-10 DIAGNOSIS — I10 Essential (primary) hypertension: Secondary | ICD-10-CM | POA: Diagnosis not present

## 2015-09-10 MED ORDER — CARBIDOPA-LEVODOPA 25-100 MG PO TABS: 1.0000 | ORAL_TABLET | Freq: Two times a day (BID) | ORAL | Status: DC

## 2015-09-10 NOTE — Progress Notes (Signed)
Victoria Lindsey was seen today in the movement disorders clinic for neurologic consultation at the request of REED, TIFFANY, DO.    I have reviewed prior records made available to me.  She is accompanied by her daughter supplements the history.  She previously saw Dr. Baltazar Najjar at [facility] and last saw her on 07/21/2015.  While I have her last note, I do not have records prior to that.  The patient reports that she was diagnosed with Parkinson's disease approximately between 5 and 10 years ago (she cannot remember).  She has trouble recalling her first sx but her daughter thinks that hypophonia may have been a first sx.  Records indicate she has been on a number of Parkinson's medications.  It stated that she did not tolerate levodopa (GI upset), Requip, Mirapex or amantadine.  The side effects with these other medications is not mentioned within Dr. Anson Crofts most recent note.  She is on neupro 8 mg and has been on that for several years.  She tried to d/c it once and sx's got worse.  She is also on azilect 1 mg daily.  The patient is on gabapentin, 100mg  q hs for restless leg syndrome.  Pt had a CT brain on 06/20/15 that I reviewed.  Mod atrophy and mild WMD  Specific Symptoms:  Tremor: Yes.  , L arm and left leg Family hx of similar:  Yes.  , father had PD Voice: hypophonic Sleep: sleeps well  Vivid Dreams:  Yes.    Acting out dreams:  No. (but daughter does state that at beginning she rolled out of bed but doesn't any longer) Wet Pillows: Yes.   Postural symptoms:  Yes.    Falls?  Yes.   (last fall 6/27 as she was trying to fold clothes while standing and had hands off of walker and fell; fell 4/14 at restaurant after standing up and visited ER because hit head but nothing serious; no hx of fx because of falls) Bradykinesia symptoms: slow movements and difficulty getting out of a chair Loss of smell:  Yes.   per daughter but pt hasn't noticed Loss of taste:  No. but does have loss of  appetite Urinary Incontinence:  Yes.   (can't get to RR fast enough) Difficulty Swallowing:  Yes.   (takes pills with applesauce; was on thickened liquids several years ago but was able to back up to normal diet with ST; last ST about 02/2014) Handwriting, micrographia: Yes.   Trouble with ADL's:  Yes.   (husband helps with ADL's)  Trouble buttoning clothing: Yes.   Depression:  No. Memory changes:  Yes.   (daughter hasn't noted a great difference but pt notes trouble with checkbook as pt cannot do addition/subtraction any longer) Hallucinations:  No. per pt, yes per daughter but rare  visual distortions: Yes.   N/V:  No. Lightheaded:  Some when first gets up  Syncope: No. Diplopia:  No.   PREVIOUS MEDICATIONS: Sinemet, Mirapex, Requip, Amantadine and Neupro  ALLERGIES:   Allergies  Allergen Reactions  . Aciphex [Rabeprazole Sodium]     unknown  . Amantadine Other (See Comments)    Pt cannot remember  . Amantadine Hcl     unknown  . Amantadines     unknown  . Avapro [Irbesartan]     unknown  . Carbidopa-Levodopa     unknown  . Cardura [Doxazosin Mesylate]     unknown  . Ciprofloxacin     unknown  . Clonidine  Derivatives     unknown  . Cozaar     nightmares  . Diltiazem     unknown  . Famotidine     unknown  . Hctz [Hydrochlorothiazide]     unknown  . Indapamide     unknown  . Lansoprazole     unknown  . Lisinopril     Mouth problems  . Loratadine     unknown  . Maxidex [Dexamethasone]     unknown  . Mirapex [Pramipexole Dihydrochloride]     unknown  . Norvasc [Amlodipine Besylate]     unknown  . Pantoprazole Sodium     unknown  . Prilosec [Omeprazole]     unknown  . Procardia [Nifedipine]     unknown  . Proton Pump Inhibitors     unknown  . Sulfa Drugs Cross Reactors     unknown  . Trimethoprim     unknown  . Zyrtec [Cetirizine Hcl]     unknown  . Metronidazole Rash  . Penicillins Rash    Has patient had a PCN reaction causing immediate  rash, facial/tongue/throat swelling, SOB or lightheadedness with hypotension: just a rash all over Has patient had a PCN reaction causing severe rash involving mucus membranes or skin necrosis: no Has patient had a PCN reaction that required hospitalization-no Has patient had a PCN reaction occurring within the last 10 years: no, more than 50 yrs ago If all of the above answers are "NO", then may proceed with Cephalosporin use.     CURRENT MEDICATIONS:  Outpatient Encounter Prescriptions as of 09/10/2015  Medication Sig  . aspirin 81 MG tablet Take 81 mg by mouth every evening.   Marland Kitchen BIOTIN PO Take 1,000 mcg by mouth daily.   . Cholecalciferol (VITAMIN D3) 2000 UNITS TABS Take 1 tablet by mouth daily.   . clopidogrel (PLAVIX) 75 MG tablet Take 1 tablet (75 mg total) by mouth every evening.  . Cranberry 500 MG CAPS Take 500 mg by mouth daily.   Marland Kitchen docusate sodium (COLACE) 100 MG capsule Take 100 mg by mouth daily.   . folic acid (FOLVITE) Q000111Q MCG tablet Take 800 mcg by mouth daily.   . furosemide (LASIX) 40 MG tablet Take 1 tablet (40 mg total) by mouth daily.  Marland Kitchen gabapentin (NEURONTIN) 100 MG capsule Take 100 mg by mouth at bedtime. One additional PRN  . metoprolol (LOPRESSOR) 100 MG tablet Take 1 tablet (100 mg total) by mouth 2 (two) times daily.  . Multiple Vitamin (MULTIVITAMIN WITH MINERALS) TABS tablet Take 1 tablet by mouth daily.  Marland Kitchen POLY-IRON 150 150 MG capsule Take ONE (1) capsule by mouth daily  . Polyethyl Glycol-Propyl Glycol (SYSTANE OP) Apply 1 drop to eye 2 (two) times daily as needed (dryness).  . rasagiline (AZILECT) 1 MG TABS Take 1 mg by mouth daily.  . Rotigotine 8 MG/24HR PT24 Place 1 patch onto the skin every evening.   Marland Kitchen spironolactone (ALDACTONE) 25 MG tablet Take 1 tablet (25 mg total) by mouth daily.  Marland Kitchen SYNTHROID 175 MCG tablet Take ONE (1) tablet by mouth daily before breakfast   No facility-administered encounter medications on file as of 09/10/2015.    PAST  MEDICAL HISTORY:   Past Medical History  Diagnosis Date  . Hypertension   . Parkinson's disease   . Hyperlipidemia   . Dizziness   . Orthostatic hypotension   . Anemia   . Tremor   . Breast cancer ([place])     LEFT BREAST  .  Kidney failure     stage 3  . Hypothyroidism   . Prediabetes   . TIA (transient ischemic attack)   . Hyperglycemia   . Left ventricular diastolic dysfunction   . Hypophonia     PAST SURGICAL HISTORY:   Past Surgical History  Procedure Laterality Date  . Cardiovascular stress test  06/2007    NORMAL  . Breast lumpectomy  2010  . Cholecystectomy  1994  . Tonsillectomy    . Nasal sinus surgery      submucous resection late 1950s  . Cataract extraction  2005,2006    Dr.Devonzo    SOCIAL HISTORY:   Social History   Social History  . Marital Status: Married    Spouse Name: N/A  . Number of Children: N/A  . Years of Education: N/A   Occupational History  . retired     [employer] (but retired for 40+ years)   Social History Main Topics  . Smoking status: Former Smoker -- 0.50 packs/day for 12 years    Types: Cigarettes    Quit date: 03/08/1964  . Smokeless tobacco: Never Used  . Alcohol Use: No  . Drug Use: No  . Sexual Activity: Not on file   Other Topics Concern  . Not on file   Social History Narrative   Quit smoking in 1967   Caffeine: Tea and chocolate    Married since 1951   Lives in a house, 1 stories, 2 persons, no pets   Current or past profession- homemaker   Exercise- Yes, Physical Therapy, Occupational Therapy   Living Will-Yes   POA/HPOA-Yes     FAMILY HISTORY:   Family Status  Relation Status Death Age  . Mother Deceased 34    heart disease  . Father Deceased 65    heart disease, parkinsonism  . Brother Deceased 36    MVA  . Sister Deceased     unknown cause of death  . Brother Deceased     prostate CA  . Brother Deceased 62    "neurological" but ? cause  . Daughter Alive     ROS:  A complete 10 system  review of systems was obtained and was unremarkable apart from what is mentioned above.  PHYSICAL EXAMINATION:    VITALS:   Filed Vitals:   09/10/15 1001  BP: 120/60  Pulse: 78  Height: 5\' 1"  (1.549 m)  Weight: 112 lb (50.803 kg)    GEN:  The patient appears stated age and is in NAD. HEENT:  Normocephalic, atraumatic.  The mucous membranes are moist. The superficial temporal arteries are without ropiness or tenderness. CV:  RRR Lungs:  CTAB Neck/HEME:  There are no carotid bruits bilaterally.  Neurological examination:  Orientation: The patient is alert and oriented x3. Fund of knowledge is appropriate.  Recent and remote memory are intact.  Attention and concentration are normal.    Able to name objects and repeat phrases. Cranial nerves: There is good facial symmetry.  There is facial hypomimia.   Pupils are equal round and reactive to light bilaterally. Fundoscopic exam reveals clear margins bilaterally. Extraocular muscles are intact. The visual fields are full to confrontational testing. The speech is fluent and clear but she is hypophonic. Soft palate rises symmetrically and there is no tongue deviation. Hearing is intact to conversational tone. Sensation: Sensation is intact to light and pinprick throughout (facial, trunk, extremities). Vibration is intact at the bilateral big toe. There is no extinction with double simultaneous stimulation.  There is no sensory dermatomal level identified. Motor: Strength is 5/5 in the bilateral upper and lower extremities.   Shoulder shrug is equal and symmetric.  There is no pronator drift. Deep tendon reflexes: Deep tendon reflexes are 2+/4 at the bilateral biceps, triceps, brachioradialis, patella and 1/4 at the bilateral achilles. Plantar responses are downgoing bilaterally.  Movement examination: Tone: There is normal tone in the bilateral upper extremities.  The tone in the lower extremities is normal.  Abnormal movements: There is LLE  tremor and RLE tremor with distraction (independently) and a RUE resting tremor can be felt with distraction.   Coordination:  There is decremation with RAM's, with heel taps/toe taps bilaterally, L more than R Gait and Station: The patient has difficulty arising out of a deep-seated chair without the use of the hands and makes several attempts but is unable.   The patient's stride length is decreased and she tends to fall to the right.  She does much better with stride length and balance with her rollator.      ASSESSMENT/PLAN:  1.  Idiopathic Parkinson's disease.   -We discussed the diagnosis as well as pathophysiology of the disease.  We discussed treatment options as well as prognostic indicators.  Patient education was provided.  -Greater than 50% of the 60 minute visit was spent in counseling answering questions and talking about what to expect now as well as in the future.  We talked about medication options as well as potential future surgical options.  We talked about safety in the home.  - Prior records indicate pt didn't tolerate levodopa (GI upset) Requip, Mirapex or amantadine.   Told her that I have never had a patient that I haven't been able to get to tolerate levodopa, even if we have to start with 1/2 tablets, the old CR version or even rytary.  Lists multiple PD meds as "allergies" but most are not true allergies but are sensitivities.  Agreeable to slowly start levodopa with 1/2 q hs and work to 1/2 bid and will call her in a month.  This will not be an effective dosage but want to start getting her used to the medication.  Concerned that neupro is contributing to memory change, illusions and rare hallucinations.  -We discussed community resources in the area including patient support groups and community exercise programs for PD and pt education was provided to the patient.  -doing home therapy and should continue that  2.  Sialorrhea  -only at night but she has trouble with dry  mouth in day because of sjogrens syndrome.    -talked about proper hydration as she isn't drinking   3.  Dysphagia  -will schedule MBE.  Has had serious dysphagia in the past and was act nectar thick but it has improved with speech therapy.    4.  HTN  -talked to her about lopressor.  Want to keep close eye on BP as PD will lower this as well.   Having lightheadedness.   F/u with Dr Marlou Porch in this regard.    5.  Dementia  -will do moca next visit.  Is another reason to look at decreasing and potentially d/c neupro.    6.  Follow up is anticipated in the next few months, sooner should new neurologic issues arise.

## 2015-09-10 NOTE — Patient Instructions (Signed)
1. We have scheduled you at [facility] for your modified barium swallow on 09/16/15 at 11:00 am. Please arrive 15 minutes prior and go to 1st floor radiology. If you need to reschedule for any reason please call [phone]. 2. Start Carbidopa Levodopa 25/100 IR as follows: 1/2 tablet at night for two weeks, then 1/2 tablet twice daily for two weeks. We will call to check on you in 4 weeks.

## 2015-09-12 ENCOUNTER — Other Ambulatory Visit (HOSPITAL_COMMUNITY): Payer: Self-pay | Admitting: Neurology

## 2015-09-12 DIAGNOSIS — R1310 Dysphagia, unspecified: Secondary | ICD-10-CM

## 2015-09-12 DIAGNOSIS — R2681 Unsteadiness on feet: Secondary | ICD-10-CM | POA: Diagnosis not present

## 2015-09-12 DIAGNOSIS — I5033 Acute on chronic diastolic (congestive) heart failure: Secondary | ICD-10-CM | POA: Diagnosis not present

## 2015-09-12 DIAGNOSIS — G20 Parkinson's disease: Secondary | ICD-10-CM | POA: Diagnosis not present

## 2015-09-12 DIAGNOSIS — M6281 Muscle weakness (generalized): Secondary | ICD-10-CM | POA: Diagnosis not present

## 2015-09-12 DIAGNOSIS — L89522 Pressure ulcer of left ankle, stage 2: Secondary | ICD-10-CM | POA: Diagnosis not present

## 2015-09-15 DIAGNOSIS — L89522 Pressure ulcer of left ankle, stage 2: Secondary | ICD-10-CM | POA: Diagnosis not present

## 2015-09-15 DIAGNOSIS — I5033 Acute on chronic diastolic (congestive) heart failure: Secondary | ICD-10-CM | POA: Diagnosis not present

## 2015-09-15 DIAGNOSIS — R2681 Unsteadiness on feet: Secondary | ICD-10-CM | POA: Diagnosis not present

## 2015-09-15 DIAGNOSIS — G20 Parkinson's disease: Secondary | ICD-10-CM | POA: Diagnosis not present

## 2015-09-15 DIAGNOSIS — M6281 Muscle weakness (generalized): Secondary | ICD-10-CM | POA: Diagnosis not present

## 2015-09-16 ENCOUNTER — Ambulatory Visit (HOSPITAL_COMMUNITY)
Admission: RE | Admit: 2015-09-16 | Discharge: 2015-09-16 | Disposition: A | Discharge status: Home / Self Care | Payer: Medicare Other | Source: Ambulatory Visit | Attending: Neurology | Admitting: Neurology

## 2015-09-16 DIAGNOSIS — E039 Hypothyroidism, unspecified: Secondary | ICD-10-CM | POA: Insufficient documentation

## 2015-09-16 DIAGNOSIS — R1313 Dysphagia, pharyngeal phase: Secondary | ICD-10-CM | POA: Insufficient documentation

## 2015-09-16 DIAGNOSIS — E785 Hyperlipidemia, unspecified: Secondary | ICD-10-CM | POA: Insufficient documentation

## 2015-09-16 DIAGNOSIS — F458 Other somatoform disorders: Secondary | ICD-10-CM | POA: Diagnosis not present

## 2015-09-16 DIAGNOSIS — L89522 Pressure ulcer of left ankle, stage 2: Secondary | ICD-10-CM | POA: Diagnosis not present

## 2015-09-16 DIAGNOSIS — R1319 Other dysphagia: Secondary | ICD-10-CM

## 2015-09-16 DIAGNOSIS — G20 Parkinson's disease: Secondary | ICD-10-CM | POA: Diagnosis not present

## 2015-09-16 DIAGNOSIS — I129 Hypertensive chronic kidney disease with stage 1 through stage 4 chronic kidney disease, or unspecified chronic kidney disease: Secondary | ICD-10-CM | POA: Insufficient documentation

## 2015-09-16 DIAGNOSIS — R2681 Unsteadiness on feet: Secondary | ICD-10-CM | POA: Diagnosis not present

## 2015-09-16 DIAGNOSIS — M6281 Muscle weakness (generalized): Secondary | ICD-10-CM | POA: Diagnosis not present

## 2015-09-16 DIAGNOSIS — N183 Chronic kidney disease, stage 3 (moderate): Secondary | ICD-10-CM | POA: Diagnosis not present

## 2015-09-16 DIAGNOSIS — I5033 Acute on chronic diastolic (congestive) heart failure: Secondary | ICD-10-CM | POA: Diagnosis not present

## 2015-09-16 DIAGNOSIS — R1310 Dysphagia, unspecified: Secondary | ICD-10-CM

## 2015-09-16 DIAGNOSIS — Z8673 Personal history of transient ischemic attack (TIA), and cerebral infarction without residual deficits: Secondary | ICD-10-CM | POA: Diagnosis not present

## 2015-09-16 DIAGNOSIS — R7303 Prediabetes: Secondary | ICD-10-CM | POA: Insufficient documentation

## 2015-09-16 NOTE — Progress Notes (Signed)
MBSS complete. Full report located under chart review in imaging section.  Ayodele Hartsock Paiewonsky, M.A. CCC-SLP [phone]  

## 2015-09-17 DIAGNOSIS — G20 Parkinson's disease: Secondary | ICD-10-CM | POA: Diagnosis not present

## 2015-09-17 DIAGNOSIS — L89522 Pressure ulcer of left ankle, stage 2: Secondary | ICD-10-CM | POA: Diagnosis not present

## 2015-09-17 DIAGNOSIS — M6281 Muscle weakness (generalized): Secondary | ICD-10-CM | POA: Diagnosis not present

## 2015-09-17 DIAGNOSIS — R2681 Unsteadiness on feet: Secondary | ICD-10-CM | POA: Diagnosis not present

## 2015-09-17 DIAGNOSIS — I5033 Acute on chronic diastolic (congestive) heart failure: Secondary | ICD-10-CM | POA: Diagnosis not present

## 2015-09-18 DIAGNOSIS — R2681 Unsteadiness on feet: Secondary | ICD-10-CM | POA: Diagnosis not present

## 2015-09-18 DIAGNOSIS — I5033 Acute on chronic diastolic (congestive) heart failure: Secondary | ICD-10-CM | POA: Diagnosis not present

## 2015-09-18 DIAGNOSIS — G20 Parkinson's disease: Secondary | ICD-10-CM | POA: Diagnosis not present

## 2015-09-18 DIAGNOSIS — M6281 Muscle weakness (generalized): Secondary | ICD-10-CM | POA: Diagnosis not present

## 2015-09-18 DIAGNOSIS — L89522 Pressure ulcer of left ankle, stage 2: Secondary | ICD-10-CM | POA: Diagnosis not present

## 2015-09-23 DIAGNOSIS — R2681 Unsteadiness on feet: Secondary | ICD-10-CM | POA: Diagnosis not present

## 2015-09-23 DIAGNOSIS — G20 Parkinson's disease: Secondary | ICD-10-CM | POA: Diagnosis not present

## 2015-09-23 DIAGNOSIS — I5033 Acute on chronic diastolic (congestive) heart failure: Secondary | ICD-10-CM | POA: Diagnosis not present

## 2015-09-23 DIAGNOSIS — M6281 Muscle weakness (generalized): Secondary | ICD-10-CM | POA: Diagnosis not present

## 2015-09-23 DIAGNOSIS — L89522 Pressure ulcer of left ankle, stage 2: Secondary | ICD-10-CM | POA: Diagnosis not present

## 2015-09-24 DIAGNOSIS — M6281 Muscle weakness (generalized): Secondary | ICD-10-CM | POA: Diagnosis not present

## 2015-09-24 DIAGNOSIS — I5033 Acute on chronic diastolic (congestive) heart failure: Secondary | ICD-10-CM | POA: Diagnosis not present

## 2015-09-24 DIAGNOSIS — G20 Parkinson's disease: Secondary | ICD-10-CM | POA: Diagnosis not present

## 2015-09-24 DIAGNOSIS — L89522 Pressure ulcer of left ankle, stage 2: Secondary | ICD-10-CM | POA: Diagnosis not present

## 2015-09-24 DIAGNOSIS — R2681 Unsteadiness on feet: Secondary | ICD-10-CM | POA: Diagnosis not present

## 2015-09-26 DIAGNOSIS — I5033 Acute on chronic diastolic (congestive) heart failure: Secondary | ICD-10-CM | POA: Diagnosis not present

## 2015-09-26 DIAGNOSIS — L89522 Pressure ulcer of left ankle, stage 2: Secondary | ICD-10-CM | POA: Diagnosis not present

## 2015-09-26 DIAGNOSIS — R2681 Unsteadiness on feet: Secondary | ICD-10-CM | POA: Diagnosis not present

## 2015-09-26 DIAGNOSIS — G20 Parkinson's disease: Secondary | ICD-10-CM | POA: Diagnosis not present

## 2015-09-26 DIAGNOSIS — M6281 Muscle weakness (generalized): Secondary | ICD-10-CM | POA: Diagnosis not present

## 2015-09-29 DIAGNOSIS — L89522 Pressure ulcer of left ankle, stage 2: Secondary | ICD-10-CM | POA: Diagnosis not present

## 2015-09-29 DIAGNOSIS — G20 Parkinson's disease: Secondary | ICD-10-CM | POA: Diagnosis not present

## 2015-09-29 DIAGNOSIS — R2681 Unsteadiness on feet: Secondary | ICD-10-CM | POA: Diagnosis not present

## 2015-09-29 DIAGNOSIS — I5033 Acute on chronic diastolic (congestive) heart failure: Secondary | ICD-10-CM | POA: Diagnosis not present

## 2015-09-29 DIAGNOSIS — M6281 Muscle weakness (generalized): Secondary | ICD-10-CM | POA: Diagnosis not present

## 2015-10-01 DIAGNOSIS — R2681 Unsteadiness on feet: Secondary | ICD-10-CM | POA: Diagnosis not present

## 2015-10-01 DIAGNOSIS — I5033 Acute on chronic diastolic (congestive) heart failure: Secondary | ICD-10-CM | POA: Diagnosis not present

## 2015-10-01 DIAGNOSIS — L89522 Pressure ulcer of left ankle, stage 2: Secondary | ICD-10-CM | POA: Diagnosis not present

## 2015-10-01 DIAGNOSIS — G20 Parkinson's disease: Secondary | ICD-10-CM | POA: Diagnosis not present

## 2015-10-01 DIAGNOSIS — M6281 Muscle weakness (generalized): Secondary | ICD-10-CM | POA: Diagnosis not present

## 2015-10-06 DIAGNOSIS — G20 Parkinson's disease: Secondary | ICD-10-CM | POA: Diagnosis not present

## 2015-10-06 DIAGNOSIS — I5033 Acute on chronic diastolic (congestive) heart failure: Secondary | ICD-10-CM | POA: Diagnosis not present

## 2015-10-06 DIAGNOSIS — R2681 Unsteadiness on feet: Secondary | ICD-10-CM | POA: Diagnosis not present

## 2015-10-06 DIAGNOSIS — L89522 Pressure ulcer of left ankle, stage 2: Secondary | ICD-10-CM | POA: Diagnosis not present

## 2015-10-06 DIAGNOSIS — M6281 Muscle weakness (generalized): Secondary | ICD-10-CM | POA: Diagnosis not present

## 2015-10-07 DIAGNOSIS — M6281 Muscle weakness (generalized): Secondary | ICD-10-CM | POA: Diagnosis not present

## 2015-10-07 DIAGNOSIS — I5033 Acute on chronic diastolic (congestive) heart failure: Secondary | ICD-10-CM | POA: Diagnosis not present

## 2015-10-07 DIAGNOSIS — G20 Parkinson's disease: Secondary | ICD-10-CM | POA: Diagnosis not present

## 2015-10-07 DIAGNOSIS — R2681 Unsteadiness on feet: Secondary | ICD-10-CM | POA: Diagnosis not present

## 2015-10-07 DIAGNOSIS — L89522 Pressure ulcer of left ankle, stage 2: Secondary | ICD-10-CM | POA: Diagnosis not present

## 2015-10-08 ENCOUNTER — Telehealth: Payer: Self-pay | Admitting: Neurology

## 2015-10-08 DIAGNOSIS — G20 Parkinson's disease: Secondary | ICD-10-CM | POA: Diagnosis not present

## 2015-10-08 DIAGNOSIS — L89522 Pressure ulcer of left ankle, stage 2: Secondary | ICD-10-CM | POA: Diagnosis not present

## 2015-10-08 DIAGNOSIS — M6281 Muscle weakness (generalized): Secondary | ICD-10-CM | POA: Diagnosis not present

## 2015-10-08 DIAGNOSIS — I5033 Acute on chronic diastolic (congestive) heart failure: Secondary | ICD-10-CM | POA: Diagnosis not present

## 2015-10-08 DIAGNOSIS — R2681 Unsteadiness on feet: Secondary | ICD-10-CM | POA: Diagnosis not present

## 2015-10-08 NOTE — Telephone Encounter (Signed)
Spoke with patient's daughter. She has been on Levodopa 1/2 tablet BID for two weeks. She hasn't noticed any change in symptoms, but also no side effects (one day of GI upset- but she gets this occasionally). Would you like her to increase medication further? Please advise.

## 2015-10-08 NOTE — Telephone Encounter (Signed)
Increase to 1 tablet BID with meals, call for any problems. Thanks!

## 2015-10-08 NOTE — Telephone Encounter (Signed)
LMOM making daughter aware. Told her to take 30 minutes before meals but could take with carbs if upsets her stomach. They are to call with any problems.

## 2015-10-08 NOTE — Telephone Encounter (Signed)
VM-PT's daughter called and said she wanted to let Dr Tat know how he was doing on his medication/Dawn

## 2015-10-09 ENCOUNTER — Telehealth: Payer: Self-pay | Admitting: Internal Medicine

## 2015-10-09 DIAGNOSIS — I5033 Acute on chronic diastolic (congestive) heart failure: Secondary | ICD-10-CM

## 2015-10-09 DIAGNOSIS — I2510 Atherosclerotic heart disease of native coronary artery without angina pectoris: Secondary | ICD-10-CM

## 2015-10-09 DIAGNOSIS — E7800 Pure hypercholesterolemia, unspecified: Secondary | ICD-10-CM

## 2015-10-09 DIAGNOSIS — N183 Chronic kidney disease, stage 3 unspecified: Secondary | ICD-10-CM

## 2015-10-09 DIAGNOSIS — G20A1 Parkinson's disease without dyskinesia, without mention of fluctuations: Secondary | ICD-10-CM

## 2015-10-09 DIAGNOSIS — E034 Atrophy of thyroid (acquired): Secondary | ICD-10-CM

## 2015-10-09 DIAGNOSIS — G20 Parkinson's disease: Secondary | ICD-10-CM

## 2015-10-09 NOTE — Telephone Encounter (Signed)
Patient is requesting to have labs done prior. Patient already scheduled appointment for labs

## 2015-10-09 NOTE — Telephone Encounter (Signed)
I didn't order labs for before the visit.  I was going to have her do them same day due to difficulty getting here.

## 2015-10-09 NOTE — Telephone Encounter (Signed)
Patient may need a TSH CBCD A1c and BMP. Dr.Reed please review and advise

## 2015-10-09 NOTE — Telephone Encounter (Signed)
Labs are ordered 

## 2015-10-10 DIAGNOSIS — G20 Parkinson's disease: Secondary | ICD-10-CM | POA: Diagnosis not present

## 2015-10-10 DIAGNOSIS — R2681 Unsteadiness on feet: Secondary | ICD-10-CM | POA: Diagnosis not present

## 2015-10-10 DIAGNOSIS — L89522 Pressure ulcer of left ankle, stage 2: Secondary | ICD-10-CM | POA: Diagnosis not present

## 2015-10-10 DIAGNOSIS — M6281 Muscle weakness (generalized): Secondary | ICD-10-CM | POA: Diagnosis not present

## 2015-10-10 DIAGNOSIS — I5033 Acute on chronic diastolic (congestive) heart failure: Secondary | ICD-10-CM | POA: Diagnosis not present

## 2015-10-12 DIAGNOSIS — G20 Parkinson's disease: Secondary | ICD-10-CM | POA: Diagnosis not present

## 2015-10-12 DIAGNOSIS — L89522 Pressure ulcer of left ankle, stage 2: Secondary | ICD-10-CM | POA: Diagnosis not present

## 2015-10-12 DIAGNOSIS — R2681 Unsteadiness on feet: Secondary | ICD-10-CM | POA: Diagnosis not present

## 2015-10-12 DIAGNOSIS — M6281 Muscle weakness (generalized): Secondary | ICD-10-CM | POA: Diagnosis not present

## 2015-10-12 DIAGNOSIS — I5033 Acute on chronic diastolic (congestive) heart failure: Secondary | ICD-10-CM | POA: Diagnosis not present

## 2015-10-13 ENCOUNTER — Ambulatory Visit: Payer: Medicare Other | Admitting: Cardiology

## 2015-10-14 DIAGNOSIS — G20 Parkinson's disease: Secondary | ICD-10-CM | POA: Diagnosis not present

## 2015-10-14 DIAGNOSIS — L89522 Pressure ulcer of left ankle, stage 2: Secondary | ICD-10-CM | POA: Diagnosis not present

## 2015-10-14 DIAGNOSIS — M6281 Muscle weakness (generalized): Secondary | ICD-10-CM | POA: Diagnosis not present

## 2015-10-14 DIAGNOSIS — R2681 Unsteadiness on feet: Secondary | ICD-10-CM | POA: Diagnosis not present

## 2015-10-14 DIAGNOSIS — I5033 Acute on chronic diastolic (congestive) heart failure: Secondary | ICD-10-CM | POA: Diagnosis not present

## 2015-10-16 ENCOUNTER — Ambulatory Visit (INDEPENDENT_AMBULATORY_CARE_PROVIDER_SITE_OTHER): Payer: Medicare Other | Admitting: Podiatry

## 2015-10-16 ENCOUNTER — Other Ambulatory Visit: Payer: Self-pay | Admitting: Internal Medicine

## 2015-10-16 ENCOUNTER — Other Ambulatory Visit: Payer: Self-pay | Admitting: Neurology

## 2015-10-16 ENCOUNTER — Encounter: Payer: Self-pay | Admitting: Podiatry

## 2015-10-16 VITALS — BP 140/60 | HR 75

## 2015-10-16 DIAGNOSIS — B351 Tinea unguium: Secondary | ICD-10-CM | POA: Diagnosis not present

## 2015-10-16 DIAGNOSIS — M79604 Pain in right leg: Secondary | ICD-10-CM

## 2015-10-16 DIAGNOSIS — M79673 Pain in unspecified foot: Secondary | ICD-10-CM

## 2015-10-16 DIAGNOSIS — G20 Parkinson's disease: Secondary | ICD-10-CM

## 2015-10-16 NOTE — Patient Instructions (Signed)
Seen for hypertrophic nails and painful corn 5th left.. All nails debrided. 5th toe left padded and dressed with antibiotic ointment. Return in 3 months or sooner if pain returns on 5th toe left.

## 2015-10-16 NOTE — Progress Notes (Signed)
SUBJECTIVE: 82 y.o. year old female presents aided by walker and accompanied by her husband complaining of pain on 5th toe left. She has Parkinson's and having problem getting her nails trimmed.   OBJECTIVE: DERMATOLOGIC EXAMINATION: Hypertrophic nails x 10. Thin shiny skin both feet. Porokeratotic lesion distal medial 5th toe left pre ulcerative. VASCULAR EXAMINATION OF LOWER LIMBS: Pedal pulses: Left foot pedal pulses are palpable with normal pulsation. Right foot is not palpable. NEUROLOGIC EXAMINATION OF THE LOWER LIMBS: All epicritic and tactile sensations grossly intact.  MUSCULOSKELETAL EXAMINATION: Bone spur distal medial 5th toe left. Thin planta fat pad bilateral.  No gross deformities noted.  ASSESSMENT: Onychomycosis x 10. Loss of plantar fat pad. Spurring at distal medial 5th toe with pre ulcerative corn. Pain in lower limbs.  PLAN: All nails debrided. 5th toe left debrided and padded. Return in 3 months or as needed

## 2015-10-20 DIAGNOSIS — M6281 Muscle weakness (generalized): Secondary | ICD-10-CM | POA: Diagnosis not present

## 2015-10-20 DIAGNOSIS — R2681 Unsteadiness on feet: Secondary | ICD-10-CM | POA: Diagnosis not present

## 2015-10-20 DIAGNOSIS — L89522 Pressure ulcer of left ankle, stage 2: Secondary | ICD-10-CM | POA: Diagnosis not present

## 2015-10-20 DIAGNOSIS — G20 Parkinson's disease: Secondary | ICD-10-CM | POA: Diagnosis not present

## 2015-10-20 DIAGNOSIS — I5033 Acute on chronic diastolic (congestive) heart failure: Secondary | ICD-10-CM | POA: Diagnosis not present

## 2015-10-21 ENCOUNTER — Telehealth: Payer: Self-pay | Admitting: Neurology

## 2015-10-21 ENCOUNTER — Other Ambulatory Visit: Payer: Medicare Other

## 2015-10-21 DIAGNOSIS — E7800 Pure hypercholesterolemia, unspecified: Secondary | ICD-10-CM

## 2015-10-21 DIAGNOSIS — G20 Parkinson's disease: Secondary | ICD-10-CM | POA: Diagnosis not present

## 2015-10-21 DIAGNOSIS — R2681 Unsteadiness on feet: Secondary | ICD-10-CM | POA: Diagnosis not present

## 2015-10-21 DIAGNOSIS — E034 Atrophy of thyroid (acquired): Secondary | ICD-10-CM

## 2015-10-21 DIAGNOSIS — R739 Hyperglycemia, unspecified: Secondary | ICD-10-CM | POA: Diagnosis not present

## 2015-10-21 DIAGNOSIS — N183 Chronic kidney disease, stage 3 unspecified: Secondary | ICD-10-CM

## 2015-10-21 DIAGNOSIS — L89522 Pressure ulcer of left ankle, stage 2: Secondary | ICD-10-CM | POA: Diagnosis not present

## 2015-10-21 DIAGNOSIS — I2510 Atherosclerotic heart disease of native coronary artery without angina pectoris: Secondary | ICD-10-CM | POA: Diagnosis not present

## 2015-10-21 DIAGNOSIS — I5033 Acute on chronic diastolic (congestive) heart failure: Secondary | ICD-10-CM | POA: Diagnosis not present

## 2015-10-21 DIAGNOSIS — E038 Other specified hypothyroidism: Secondary | ICD-10-CM | POA: Diagnosis not present

## 2015-10-21 DIAGNOSIS — M6281 Muscle weakness (generalized): Secondary | ICD-10-CM | POA: Diagnosis not present

## 2015-10-21 LAB — COMPLETE METABOLIC PANEL WITH GFR
ALT: 30 U/L — ABNORMAL HIGH (ref 6–29)
AST: 50 U/L — ABNORMAL HIGH (ref 10–35)
Albumin: 3.8 g/dL (ref 3.6–5.1)
Alkaline Phosphatase: 150 U/L — ABNORMAL HIGH (ref 33–130)
BUN: 44 mg/dL — ABNORMAL HIGH (ref 7–25)
CO2: 29 mmol/L (ref 20–31)
Calcium: 9.1 mg/dL (ref 8.6–10.4)
Chloride: 101 mmol/L (ref 98–110)
Creat: 1.37 mg/dL — ABNORMAL HIGH (ref 0.60–0.88)
GFR, Est African American: 40 mL/min — ABNORMAL LOW (ref >=60)
GFR, Est Non African American: 35 mL/min — ABNORMAL LOW (ref >=60)
Glucose, Bld: 114 mg/dL — ABNORMAL HIGH (ref 65–99)
Potassium: 4.4 mmol/L (ref 3.5–5.3)
Sodium: 141 mmol/L (ref 135–146)
Total Bilirubin: 0.6 mg/dL (ref 0.2–1.2)
Total Protein: 6.9 g/dL (ref 6.1–8.1)

## 2015-10-21 LAB — CBC WITH DIFFERENTIAL/PLATELET
Basophils Absolute: 0 cells/uL (ref 0–200)
Basophils Relative: 0 %
Eosinophils Absolute: 296 cells/uL (ref 15–500)
Eosinophils Relative: 4 %
HCT: 37.8 % (ref 35.0–45.0)
Hemoglobin: 12.2 g/dL (ref 11.7–15.5)
Lymphs Abs: 1850 cells/uL (ref 850–3900)
MCH: 32.1 pg (ref 27.0–33.0)
MCHC: 32.3 g/dL (ref 32.0–36.0)
MCV: 99.5 fL (ref 80.0–100.0)
MPV: 9.9 fL (ref 7.5–12.5)
Monocytes Absolute: 962 cells/uL — ABNORMAL HIGH (ref 200–950)
Monocytes Relative: 13 %
Neutro Abs: 4292 cells/uL (ref 1500–7800)
Neutrophils Relative %: 58 %
Platelets: 153 10*3/uL (ref 140–400)
RBC: 3.8 MIL/uL (ref 3.80–5.10)
RDW: 16.6 % — ABNORMAL HIGH (ref 11.0–15.0)
WBC: 7.4 10*3/uL (ref 3.8–10.8)

## 2015-10-21 LAB — LIPID PANEL
Cholesterol: 200 mg/dL (ref 125–200)
HDL: 40 mg/dL — ABNORMAL LOW (ref >=46)
LDL Cholesterol: 125 mg/dL (ref <130)
Total CHOL/HDL Ratio: 5 Ratio (ref <=5.0)
Triglycerides: 175 mg/dL — ABNORMAL HIGH (ref <150)
VLDL: 35 mg/dL — ABNORMAL HIGH (ref <30)

## 2015-10-21 LAB — TSH: TSH: 2.78 mIU/L

## 2015-10-21 NOTE — Telephone Encounter (Signed)
I don't think that is from the medication and could be from progression of disease but should be checked out by PCP to make sure nothing else like UTI going on

## 2015-10-21 NOTE — Telephone Encounter (Signed)
Please call daughter [name] about mother being sleepy in the daytime   [phone]

## 2015-10-21 NOTE — Telephone Encounter (Signed)
Spoke with patient's daughter - she states patient has been sleeping more lately. She has always had a lot of fatigue but lately seems worse. She will sleep all night, then between breakfast and lunch, and also in the afternoon. Anytime she is sitting down to watch TV or riding in the car she will fall alsleep. Her daughter did not know whether to be worried or if this was to be expected. She is also now on Carbidopa Levodopa 25/100 - 1 pill BID and didn't know if fatigue could be worsening from medication.   She also wanted to know if how much further she will need to titrate on medication to stop Neupro. Follow up appt not scheduled until 12/19/15.  Please advise.

## 2015-10-21 NOTE — Telephone Encounter (Signed)
Spoke with patient's daughter and made her aware. She will take her to PCP to be evaluated. She will stay on current dose of Levodopa and stop Neupro per Dr. Carles Collet. She will call with increase in Parkinson's symptoms.

## 2015-10-22 DIAGNOSIS — R2681 Unsteadiness on feet: Secondary | ICD-10-CM | POA: Diagnosis not present

## 2015-10-22 DIAGNOSIS — L89522 Pressure ulcer of left ankle, stage 2: Secondary | ICD-10-CM | POA: Diagnosis not present

## 2015-10-22 DIAGNOSIS — I5033 Acute on chronic diastolic (congestive) heart failure: Secondary | ICD-10-CM | POA: Diagnosis not present

## 2015-10-22 DIAGNOSIS — G20 Parkinson's disease: Secondary | ICD-10-CM | POA: Diagnosis not present

## 2015-10-22 DIAGNOSIS — M6281 Muscle weakness (generalized): Secondary | ICD-10-CM | POA: Diagnosis not present

## 2015-10-22 LAB — HEMOGLOBIN A1C
Hgb A1c MFr Bld: 6.2 % — ABNORMAL HIGH (ref <5.7)
Mean Plasma Glucose: 131 mg/dL

## 2015-10-23 ENCOUNTER — Encounter: Payer: Self-pay | Admitting: *Deleted

## 2015-10-23 DIAGNOSIS — I5033 Acute on chronic diastolic (congestive) heart failure: Secondary | ICD-10-CM | POA: Diagnosis not present

## 2015-10-23 DIAGNOSIS — M6281 Muscle weakness (generalized): Secondary | ICD-10-CM | POA: Diagnosis not present

## 2015-10-23 DIAGNOSIS — G20 Parkinson's disease: Secondary | ICD-10-CM | POA: Diagnosis not present

## 2015-10-23 DIAGNOSIS — R2681 Unsteadiness on feet: Secondary | ICD-10-CM | POA: Diagnosis not present

## 2015-10-23 DIAGNOSIS — L89522 Pressure ulcer of left ankle, stage 2: Secondary | ICD-10-CM | POA: Diagnosis not present

## 2015-10-24 ENCOUNTER — Ambulatory Visit (INDEPENDENT_AMBULATORY_CARE_PROVIDER_SITE_OTHER): Payer: Medicare Other | Admitting: Internal Medicine

## 2015-10-24 ENCOUNTER — Encounter: Payer: Self-pay | Admitting: Internal Medicine

## 2015-10-24 VITALS — BP 120/60 | HR 84 | Temp 97.6°F | Wt 112.0 lb

## 2015-10-24 DIAGNOSIS — N183 Chronic kidney disease, stage 3 unspecified: Secondary | ICD-10-CM

## 2015-10-24 DIAGNOSIS — G20A1 Parkinson's disease without dyskinesia, without mention of fluctuations: Secondary | ICD-10-CM

## 2015-10-24 DIAGNOSIS — Z23 Encounter for immunization: Secondary | ICD-10-CM | POA: Diagnosis not present

## 2015-10-24 DIAGNOSIS — G20 Parkinson's disease: Secondary | ICD-10-CM

## 2015-10-24 DIAGNOSIS — E7800 Pure hypercholesterolemia, unspecified: Secondary | ICD-10-CM

## 2015-10-24 DIAGNOSIS — E034 Atrophy of thyroid (acquired): Secondary | ICD-10-CM

## 2015-10-24 DIAGNOSIS — E038 Other specified hypothyroidism: Secondary | ICD-10-CM

## 2015-10-24 LAB — BASIC METABOLIC PANEL
BUN: 45 mg/dL — ABNORMAL HIGH (ref 7–25)
CO2: 25 mmol/L (ref 20–31)
Calcium: 8.6 mg/dL (ref 8.6–10.4)
Chloride: 102 mmol/L (ref 98–110)
Creat: 1.33 mg/dL — ABNORMAL HIGH (ref 0.60–0.88)
Glucose, Bld: 99 mg/dL (ref 65–99)
Potassium: 4.2 mmol/L (ref 3.5–5.3)
Sodium: 140 mmol/L (ref 135–146)

## 2015-10-24 NOTE — Progress Notes (Signed)
Location:  [facility] clinic Provider:  Jashua Knaak L. Mariea Clonts, D.O., C.M.D.  Code Status: DNR Goals of Care:  Advanced Directives 10/24/2015  Does patient have an advance directive? Yes  Type of Paramedic of [place];Living will  Does patient want to make changes to advanced directive? -  Copy of advanced directive(s) in chart? Yes  Would patient like information on creating an advanced directive? -  Pre-existing out of facility DNR order (yellow form or pink MOST form) -     Chief Complaint  Patient presents with  . Medical Management of Chronic Issues    3 mth follow-up    HPI: Patient is a 82 y.o. female seen today for medical management of chronic diseases.    She is feeling fine today.   About 3 wks ago, her son in law had a virus and then her daughter got it and may have exposed the patient to it.  Her daughter suspects she had a touch of it b/c it caused nausea and vomiting.  Pt was only nauseated.    Sugar average improved.  TSH normal.   LDL went up from 55 to 125 off crestor.  AST and ALT slightly up and AP.    She is drinking more water now in the past three days since drinking bottled water.  Her renal function had been worse on her labs with cr up to 1.37 with GFR 35.  Does not take aleve or ibuprofen.  Dr. Carles Collet did put her on sinemet very low dose with increase every 2-3 wks and took her off nupro patch.  No difficulty with nausea this time.    Past Medical History:  Diagnosis Date  . Anemia   . Breast cancer ([place])    LEFT BREAST  . Dizziness   . Hyperglycemia   . Hyperlipidemia   . Hypertension   . Hypophonia   . Hypothyroidism   . Kidney failure    stage 3  . Left ventricular diastolic dysfunction   . Orthostatic hypotension   . Parkinson's disease   . Prediabetes   . TIA (transient ischemic attack)   . Tremor     Past Surgical History:  Procedure Laterality Date  . BREAST LUMPECTOMY  2010  . CARDIOVASCULAR STRESS TEST  06/2007     NORMAL  . CATARACT EXTRACTION  2005,2006   Dr.Devonzo  . CHOLECYSTECTOMY  1994  . NASAL SINUS SURGERY     submucous resection late 1950s  . TONSILLECTOMY      Allergies  Allergen Reactions  . Aciphex [Rabeprazole Sodium]     unknown  . Amantadine Other (See Comments)    Pt cannot remember  . Amantadine Hcl     unknown  . Amantadines     unknown  . Avapro [Irbesartan]     unknown  . Carbidopa-Levodopa     unknown  . Cardura [Doxazosin Mesylate]     unknown  . Ciprofloxacin     unknown  . Clonidine Derivatives     unknown  . Cozaar     nightmares  . Diltiazem     unknown  . Famotidine     unknown  . Hctz [Hydrochlorothiazide]     unknown  . Indapamide     unknown  . Lansoprazole     unknown  . Lisinopril     Mouth problems  . Loratadine     unknown  . Maxidex [Dexamethasone]     unknown  . Mirapex [Pramipexole Dihydrochloride]  unknown  . Norvasc [Amlodipine Besylate]     unknown  . Pantoprazole Sodium     unknown  . Prilosec [Omeprazole]     unknown  . Procardia [Nifedipine]     unknown  . Proton Pump Inhibitors     unknown  . Sulfa Drugs Cross Reactors     unknown  . Trimethoprim     unknown  . Zyrtec [Cetirizine Hcl]     unknown  . Metronidazole Rash  . Penicillins Rash    Has patient had a PCN reaction causing immediate rash, facial/tongue/throat swelling, SOB or lightheadedness with hypotension: just a rash all over Has patient had a PCN reaction causing severe rash involving mucus membranes or skin necrosis: no Has patient had a PCN reaction that required hospitalization-no Has patient had a PCN reaction occurring within the last 10 years: no, more than 50 yrs ago If all of the above answers are "NO", then may proceed with Cephalosporin use.       Medication List       Accurate as of 10/24/15 11:34 AM. Always use your most recent med list.          aspirin 81 MG tablet Take 81 mg by mouth every evening.   AZILECT 1 MG  Tabs tablet Generic drug:  rasagiline Take 1 mg by mouth daily.   BIOTIN PO Take 1,000 mcg by mouth daily.   carbidopa-levodopa 25-100 MG tablet Commonly known as:  SINEMET IR Take 1 tablet by mouth 2 (two) times daily.   clopidogrel 75 MG tablet Commonly known as:  PLAVIX Take 1 tablet (75 mg total) by mouth every evening.   Cranberry 500 MG Caps Take 500 mg by mouth daily.   docusate sodium 100 MG capsule Commonly known as:  COLACE Take 100 mg by mouth daily.   folic acid Q000111Q MCG tablet Commonly known as:  FOLVITE Take 800 mcg by mouth daily.   furosemide 40 MG tablet Commonly known as:  LASIX Take 1 tablet (40 mg total) by mouth daily.   gabapentin 100 MG capsule Commonly known as:  NEURONTIN Take 100 mg by mouth at bedtime. One additional PRN   metoprolol 100 MG tablet Commonly known as:  LOPRESSOR Take 1 tablet (100 mg total) by mouth 2 (two) times daily.   multivitamin with minerals Tabs tablet Take 1 tablet by mouth daily.   POLY-IRON 150 150 MG capsule Generic drug:  iron polysaccharides Take ONE (1) capsule by mouth daily   Rotigotine 8 MG/24HR Pt24 Place 1 patch onto the skin every evening.   spironolactone 25 MG tablet Commonly known as:  ALDACTONE Take 1 tablet (25 mg total) by mouth daily.   SYNTHROID 175 MCG tablet Generic drug:  levothyroxine Take ONE (1) tablet by mouth daily before breakfast   SYSTANE OP Apply 1 drop to eye 2 (two) times daily as needed (dryness).   Vitamin D3 2000 units Tabs Take 1 tablet by mouth daily.       Review of Systems:  Review of Systems  Constitutional: Negative for chills, fever and malaise/fatigue.  HENT: Positive for hearing loss.   Eyes:       Glasses  Respiratory: Negative for shortness of breath.   Cardiovascular: Negative for chest pain, palpitations and leg swelling.  Gastrointestinal: Positive for constipation. Negative for abdominal pain, blood in stool, diarrhea and melena.   Genitourinary: Negative for dysuria, frequency and urgency.  Musculoskeletal: Positive for falls and joint pain.  Skin: Negative for  itching and rash.  Neurological: Positive for tremors and weakness. Negative for dizziness, loss of consciousness and headaches.  Endo/Heme/Allergies: Bruises/bleeds easily.  Psychiatric/Behavioral: Negative for depression and memory loss. The patient is not nervous/anxious and does not have insomnia.     Health Maintenance  Topic Date Due  . INFLUENZA VACCINE  10/07/2015  . FOOT EXAM  10/08/2015  . URINE MICROALBUMIN  10/11/2015  . PNA vac Low Risk Adult (2 of 2 - PCV13) 10/06/2023 (Originally 05/24/2014)  . MAMMOGRAM  03/08/2048 (Originally 03/08/2012)  . OPHTHALMOLOGY EXAM  04/06/2016  . HEMOGLOBIN A1C  04/22/2016  . TETANUS/TDAP  02/08/2022  . DEXA SCAN  Completed  . ZOSTAVAX  Completed    Physical Exam: Vitals:   10/24/15 1106  BP: 120/60  Pulse: 84  Temp: 97.6 F (36.4 C)  TempSrc: Oral  SpO2: 94%  Weight: 112 lb (50.8 kg)   Body mass index is 21.16 kg/m. Physical Exam  Constitutional: She is oriented to person, place, and time. She appears well-developed and well-nourished. No distress.  Cardiovascular: Normal rate, regular rhythm, normal heart sounds and intact distal pulses.   Pulmonary/Chest: Effort normal and breath sounds normal.  Abdominal: Soft. Bowel sounds are normal.  Musculoskeletal: Normal range of motion.  Stooped posture, appears to be walking faster since meds changed  Neurological: She is alert and oriented to person, place, and time.  Skin: Skin is warm and dry.  Alopecia; not wearing her hat today  Psychiatric: She has a normal mood and affect.    Labs reviewed: Basic Metabolic Panel:  Recent Labs  04/21/15 0929 06/06/15 0936 06/20/15 1421 10/21/15 0909  NA 139  --  139 141  K 4.8  --  4.6 4.4  CL 96  --  99* 101  CO2 25  --  28 29  GLUCOSE 98  --  134* 114*  BUN 36*  --  35* 44*  CREATININE 1.10*   --  1.04* 1.37*  CALCIUM 9.3  --  9.8 9.1  TSH 9.860* 5.620*  --  2.78   Liver Function Tests:  Recent Labs  01/25/15 1151 04/21/15 0929 10/21/15 0909  AST 75* 44* 50*  ALT 78* 36* 30*  ALKPHOS 176* 166* 150*  BILITOT 0.4 0.4 0.6  PROT 8.4* 7.1 6.9  ALBUMIN 4.2 3.9 3.8    Recent Labs  01/25/15 1151  LIPASE 33   No results for input(s): AMMONIA in the last 8760 hours. CBC:  Recent Labs  03/17/15 04/21/15 0929 06/20/15 1421 07/11/15 1000 10/21/15 0909  WBC 6.4 6.7 6.0 6.1 7.4  NEUTROABS  --  3.7  --  3.4 4,292  HGB 11.5*  --  11.5*  --  12.2  HCT 36 36.9 35.9* 35.4 37.8  MCV  --  96 100.3* 97 99.5  PLT 127* 148* 125* 132* 153   Lipid Panel:  Recent Labs  01/08/15 0806 10/21/15 0909  CHOL 112 200  HDL 39* 40*  LDLCALC 55 125  TRIG 89 175*  CHOLHDL 2.9 5.0   Lab Results  Component Value Date   HGBA1C 6.2 (H) 10/21/2015    Assessment/Plan 1. CKD (chronic kidney disease) stage 3, GFR 30-59 ml/min - was slightly worse on labs - hydrate, avoid nsaids and other nephrotoxic meds - her daughter is really working with her to get her to drink more bottled water which will help [place] keep track also - Basic Metabolic Panel  2. Hypothyroidism due to acquired atrophy of thyroid -tsh now normal  after adjustment and her daughter taking over the pillbox (pt's husband was doing it, but his memory is worse than the patient's memory)  3. Parkinson's disease ([place]) -is now on sinemet and doing better it seems to me -cont to follow with Dr. Carles Collet -cont use of walker at all times  4. Hypercholesterolemia -lipids have gone up off statin, but could not tolerate with myalgias and also has had difficulty with transaminitis which seems to be fatty liver--not sure it makes sense for her to be on a statin at 17 anyway when she needs all the muscle strength she can get to stay upright  5. Need for immunization against influenza - Flu Vaccine QUAD 36+ mos PF IM (Fluarix & Fluzone  Quad PF)  Labs/tests ordered:   Orders Placed This Encounter  Procedures  . Flu Vaccine QUAD 36+ mos PF IM (Fluarix & Fluzone Quad PF)  . Basic Metabolic Panel    Next appt:  01/19/2016   Paylin Hailu L. Waylin Dorko, D.O. [facility] [address], [place] [address] Cell Phone (Mon-Fri 8am-5pm):  [phone] On Call:  [phone] & follow prompts after 5pm & weekends Office Phone:  [phone] Office Fax:  [phone]

## 2015-10-27 ENCOUNTER — Encounter: Payer: Self-pay | Admitting: *Deleted

## 2015-10-28 ENCOUNTER — Telehealth: Payer: Self-pay | Admitting: Neurology

## 2015-10-28 NOTE — Telephone Encounter (Signed)
Spoke with patient's daughter. She states patient has had the feeling of shaking and unsteady legs for the past two days, worse today than yesterday. She is now off Neupro and taking Carbidopa Levodopa 25/100 IR - 1 tablet BID. She states she thinks her anxiety is increased as well. They are okay with increasing Levodopa or adding Neupro back- whatever you advise them to do. Daughter may not be able to answer when I call back- said I could leave message on machine. Any prescriptions should go to [facility].

## 2015-10-28 NOTE — Telephone Encounter (Signed)
Daughter called, her mother called and told her that her legs can't be still, she feels awful and is unsteady on her feet.  The daughter is on her way over there now to lay eyes on her mother.  The daughter is wondering if she needs to be back on her Neupro patch or does she need to be taking levodopa 3 times a day.  Please call [name].

## 2015-10-28 NOTE — Telephone Encounter (Signed)
Make an appt to see me next week at my first avail.

## 2015-10-28 NOTE — Telephone Encounter (Signed)
Left message making daughter aware to call back and make a follow up appt.

## 2015-10-29 DIAGNOSIS — R2681 Unsteadiness on feet: Secondary | ICD-10-CM | POA: Diagnosis not present

## 2015-10-29 DIAGNOSIS — M6281 Muscle weakness (generalized): Secondary | ICD-10-CM | POA: Diagnosis not present

## 2015-10-29 DIAGNOSIS — L89522 Pressure ulcer of left ankle, stage 2: Secondary | ICD-10-CM | POA: Diagnosis not present

## 2015-10-29 DIAGNOSIS — G20 Parkinson's disease: Secondary | ICD-10-CM | POA: Diagnosis not present

## 2015-10-29 DIAGNOSIS — I5033 Acute on chronic diastolic (congestive) heart failure: Secondary | ICD-10-CM | POA: Diagnosis not present

## 2015-10-30 DIAGNOSIS — R2681 Unsteadiness on feet: Secondary | ICD-10-CM | POA: Diagnosis not present

## 2015-10-30 DIAGNOSIS — I5033 Acute on chronic diastolic (congestive) heart failure: Secondary | ICD-10-CM | POA: Diagnosis not present

## 2015-10-30 DIAGNOSIS — L89522 Pressure ulcer of left ankle, stage 2: Secondary | ICD-10-CM | POA: Diagnosis not present

## 2015-10-30 DIAGNOSIS — M6281 Muscle weakness (generalized): Secondary | ICD-10-CM | POA: Diagnosis not present

## 2015-10-30 DIAGNOSIS — G20 Parkinson's disease: Secondary | ICD-10-CM | POA: Diagnosis not present

## 2015-10-31 ENCOUNTER — Other Ambulatory Visit: Payer: Self-pay | Admitting: *Deleted

## 2015-10-31 DIAGNOSIS — L89522 Pressure ulcer of left ankle, stage 2: Secondary | ICD-10-CM | POA: Diagnosis not present

## 2015-10-31 DIAGNOSIS — G20 Parkinson's disease: Secondary | ICD-10-CM | POA: Diagnosis not present

## 2015-10-31 DIAGNOSIS — R2681 Unsteadiness on feet: Secondary | ICD-10-CM | POA: Diagnosis not present

## 2015-10-31 DIAGNOSIS — M6281 Muscle weakness (generalized): Secondary | ICD-10-CM | POA: Diagnosis not present

## 2015-10-31 DIAGNOSIS — I5033 Acute on chronic diastolic (congestive) heart failure: Secondary | ICD-10-CM | POA: Diagnosis not present

## 2015-10-31 MED ORDER — METOPROLOL TARTRATE 100 MG PO TABS: 100.0000 mg | ORAL_TABLET | Freq: Two times a day (BID) | ORAL | 2 refills | Status: DC

## 2015-11-01 NOTE — Progress Notes (Signed)
Victoria Lindsey was seen today in the movement disorders clinic for neurologic consultation at the request of REED, TIFFANY, DO.    I have reviewed prior records made available to me.  She is accompanied by her daughter supplements the history.  She previously saw Dr. Baltazar Najjar at [facility] and last saw her on 07/21/2015.  While I have her last note, I do not have records prior to that.  The patient reports that she was diagnosed with Parkinson's disease approximately between 5 and 10 years ago (she cannot remember).  She has trouble recalling her first sx but her daughter thinks that hypophonia may have been a first sx.  Records indicate she has been on a number of Parkinson's medications.  It stated that she did not tolerate levodopa (GI upset), Requip, Mirapex or amantadine.  The side effects with these other medications is not mentioned within Dr. Anson Crofts most recent note.  She is on neupro 8 mg and has been on that for several years.  She tried to d/c it once and sx's got worse.  She is also on azilect 1 mg daily.  The patient is on gabapentin, '100mg'$  q hs for restless leg syndrome.  Pt had a CT brain on 06/20/15 that I reviewed.  Mod atrophy and mild WMD  11/03/15 update:  Pt f/u today for PD, accompanied by her daughter, who supplements the history.  The records that were made available to me were reviewed since last visit.  I was able to get her started on levodopa and she has worked to 1 po bid (8-10am/7-9pm).  Her daughter has called a few times.  She seemed excessively sleepy and we ended up d/c her neupro on 8/15.  Her daughter called on 8/22 to state that pt is more restless and anxious and they are f/u today in that regard.  somedays she has this all day long and some days it is just part of the day.  She thinks that it is more evening but states that it isn't anxiety and has trouble describing it.  May be jitteriness.  No falls but not walking as well since d/c the neupro.  No hallucinations.   Husband has dementia and memory worse than pts per daughter but he is resistant to getting help or leaving the home.  Addressed with him multiple times per daughter.  Had MBE on 09/16/15 with mild pharyngeal phase dysphagia and suspected primary esophageal dysphagia and regular diet with thin liquids was recommended.   PREVIOUS MEDICATIONS: Sinemet, Mirapex, Requip, Amantadine and Neupro  ALLERGIES:   Allergies  Allergen Reactions  . Aciphex [Rabeprazole Sodium]     unknown  . Amantadine Other (See Comments)    Pt cannot remember  . Amantadine Hcl     unknown  . Amantadines     unknown  . Avapro [Irbesartan]     unknown  . Carbidopa-Levodopa     unknown  . Cardura [Doxazosin Mesylate]     unknown  . Ciprofloxacin     unknown  . Clonidine Derivatives     unknown  . Cozaar     nightmares  . Diltiazem     unknown  . Famotidine     unknown  . Hctz [Hydrochlorothiazide]     unknown  . Indapamide     unknown  . Lansoprazole     unknown  . Lisinopril     Mouth problems  . Loratadine     unknown  . Maxidex [Dexamethasone]  unknown  . Mirapex [Pramipexole Dihydrochloride]     unknown  . Norvasc [Amlodipine Besylate]     unknown  . Pantoprazole Sodium     unknown  . Prilosec [Omeprazole]     unknown  . Procardia [Nifedipine]     unknown  . Proton Pump Inhibitors     unknown  . Sulfa Drugs Cross Reactors     unknown  . Trimethoprim     unknown  . Zyrtec [Cetirizine Hcl]     unknown  . Metronidazole Rash  . Penicillins Rash    Has patient had a PCN reaction causing immediate rash, facial/tongue/throat swelling, SOB or lightheadedness with hypotension: just a rash all over Has patient had a PCN reaction causing severe rash involving mucus membranes or skin necrosis: no Has patient had a PCN reaction that required hospitalization-no Has patient had a PCN reaction occurring within the last 10 years: no, more than 50 yrs ago If all of the above answers are "NO",  then may proceed with Cephalosporin use.     CURRENT MEDICATIONS:  Outpatient Encounter Prescriptions as of 11/03/2015  Medication Sig  . aspirin 81 MG tablet Take 81 mg by mouth every evening.   Marland Kitchen BIOTIN PO Take 1,000 mcg by mouth daily.   . carbidopa-levodopa (SINEMET IR) 25-100 MG tablet Take 1 tablet by mouth 2 (two) times daily.  . Cholecalciferol (VITAMIN D3) 2000 UNITS TABS Take 1 tablet by mouth daily.   . clopidogrel (PLAVIX) 75 MG tablet Take 1 tablet (75 mg total) by mouth every evening.  . Cranberry 500 MG CAPS Take 500 mg by mouth daily.   Marland Kitchen docusate sodium (COLACE) 100 MG capsule Take 100 mg by mouth 2 (two) times daily.   . folic acid (FOLVITE) 937 MCG tablet Take 800 mcg by mouth daily.   . furosemide (LASIX) 40 MG tablet Take 1 tablet (40 mg total) by mouth daily.  Marland Kitchen gabapentin (NEURONTIN) 100 MG capsule Take 100 mg by mouth at bedtime. One additional PRN  . metoprolol (LOPRESSOR) 100 MG tablet Take 1 tablet (100 mg total) by mouth 2 (two) times daily.  . Multiple Vitamin (MULTIVITAMIN WITH MINERALS) TABS tablet Take 1 tablet by mouth daily.  Marland Kitchen POLY-IRON 150 150 MG capsule Take ONE (1) capsule by mouth daily  . Polyethyl Glycol-Propyl Glycol (SYSTANE OP) Apply 1 drop to eye 2 (two) times daily as needed (dryness).  . rasagiline (AZILECT) 1 MG TABS Take 1 mg by mouth daily.  Marland Kitchen spironolactone (ALDACTONE) 25 MG tablet Take 1 tablet (25 mg total) by mouth daily.  Marland Kitchen SYNTHROID 175 MCG tablet Take ONE (1) tablet by mouth daily before breakfast  . [DISCONTINUED] Rotigotine 8 MG/24HR PT24 Place 1 patch onto the skin every evening.    No facility-administered encounter medications on file as of 11/03/2015.     PAST MEDICAL HISTORY:   Past Medical History:  Diagnosis Date  . Anemia   . Breast cancer ([place])    LEFT BREAST  . Dizziness   . Hyperglycemia   . Hyperlipidemia   . Hypertension   . Hypophonia   . Hypothyroidism   . Kidney failure    stage 3  . Left ventricular  diastolic dysfunction   . Orthostatic hypotension   . Parkinson's disease   . Prediabetes   . TIA (transient ischemic attack)   . Tremor     PAST SURGICAL HISTORY:   Past Surgical History:  Procedure Laterality Date  . BREAST LUMPECTOMY  2010  .  CARDIOVASCULAR STRESS TEST  06/2007   NORMAL  . CATARACT EXTRACTION  2005,2006   Dr.Devonzo  . CHOLECYSTECTOMY  1994  . NASAL SINUS SURGERY     submucous resection late 1950s  . TONSILLECTOMY      SOCIAL HISTORY:   Social History   Social History  . Marital status: Married    Spouse name: N/A  . Number of children: N/A  . Years of education: N/A   Occupational History  . retired     [employer] (but retired for 40+ years)   Social History Main Topics  . Smoking status: Former Smoker    Packs/day: 0.50    Years: 12.00    Types: Cigarettes    Quit date: 03/08/1964  . Smokeless tobacco: Never Used  . Alcohol use No  . Drug use: No  . Sexual activity: Not on file   Other Topics Concern  . Not on file   Social History Narrative   Quit smoking in 1967   Caffeine: Tea and chocolate    Married since 1951   Lives in a house, 1 stories, 2 persons, no pets   Current or past profession- homemaker   Exercise- Yes, Physical Therapy, Occupational Therapy   Living Will-Yes   POA/HPOA-Yes     FAMILY HISTORY:   Family Status  Relation Status  . Mother Deceased at age 10   heart disease  . Father Deceased at age 23   heart disease, parkinsonism  . Brother Deceased at age 61   MVA  . Sister Deceased   unknown cause of death  . Brother Deceased   prostate CA  . Brother Deceased at age 73   "neurological" but ? cause  . Daughter Alive    ROS:  A complete 10 system review of systems was obtained and was unremarkable apart from what is mentioned above.  PHYSICAL EXAMINATION:    VITALS:   Vitals:   11/03/15 1406  BP: 110/60  Pulse: 78  Weight: 115 lb (52.2 kg)  Height: 5' (1.524 m)    GEN:  The patient appears  stated age and is in NAD. HEENT:  Normocephalic, atraumatic.  The mucous membranes are moist. The superficial temporal arteries are without ropiness or tenderness. CV:  RRR Lungs:  CTAB Neck/HEME:  There are no carotid bruits bilaterally.  Neurological examination:  Orientation:  Montreal Cognitive Assessment  11/03/2015  Visuospatial/ Executive (0/5) 4  Naming (0/3) 2  Attention: Read list of digits (0/2) 2  Attention: Read list of letters (0/1) 1  Attention: Serial 7 subtraction starting at 100 (0/3) 0  Language: Repeat phrase (0/2) 0  Language : Fluency (0/1) 0  Abstraction (0/2) 2  Delayed Recall (0/5) 0  Orientation (0/6) 4  Total 15  Adjusted Score (based on education) 15   Cranial nerves: There is good facial symmetry.  There is facial hypomimia.   Pupils are equal round and reactive to light bilaterally. Fundoscopic exam reveals clear margins bilaterally. Extraocular muscles are intact. The visual fields are full to confrontational testing. The speech is fluent and clear but she is hypophonic. Soft palate rises symmetrically and there is no tongue deviation. Hearing is intact to conversational tone. Sensation: Sensation is intact to light and pinprick throughout (facial, trunk, extremities). Vibration is intact at the bilateral big toe. There is no extinction with double simultaneous stimulation. There is no sensory dermatomal level identified. Motor: Strength is 5/5 in the bilateral upper and lower extremities.   Shoulder shrug  is equal and symmetric.  There is no pronator drift.   Movement examination: Tone: There is normal tone in the bilateral upper extremities.  The tone in the lower extremities is normal.  Abnormal movements: There is no tremor today Coordination:  There is decremation with RAM's, with heel taps/toe taps bilaterally, L more than R Gait and Station: The patient has difficulty arising out of a deep-seated chair without the use of the hands and makes several  attempts but is unable.   The patient's stride length is good with the rollator  ASSESSMENT/PLAN:  1.  Idiopathic Parkinson's disease.   -We discussed the diagnosis as well as pathophysiology of the disease.  We discussed treatment options as well as prognostic indicators.  Patient education was provided.  -Greater than 50% of the 60 minute visit was spent in counseling answering questions and talking about what to expect now as well as in the future.  We talked about medication options as well as potential future surgical options.  We talked about safety in the home.  - Prior records indicate pt didn't tolerate levodopa (GI upset) Requip, Mirapex or amantadine.   Have been able to get her to tolerate levodopa by starting slow.  I d/c the neupro secondary to cognitive change.  Suspect they are seeing worsening of RLS due to levodopa dose being so low as I was going up so slow due to intolerances in the past.  Will increase to tid and move dosages closer together.  If gets anxious, told her to try extra 1/2 tablet and see if helps.  May need bedtime dose of the CR in the future but we will see how she does with this.  -We discussed community resources in the area including patient support groups and community exercise programs for PD and pt education was provided to the patient.  -had them meet our PD social worker today for more resource information.  She met with them for 1 hour after our visit was completed  2.  Sialorrhea  -only at night but she has trouble with dry mouth in day because of sjogrens syndrome.    -talked about proper hydration as she isn't drinking   3.  Dysphagia  -Had MBE on 09/16/15 with mild pharyngeal phase dysphagia and suspected primary esophageal dysphagia and regular diet with thin liquids was recommended.  4.  HTN  -talked to her about lopressor.  Want to keep close eye on BP as PD will lower this as well.   Having lightheadedness.   F/u with Dr Marlou Porch in this regard.      5.  Parkinson's disease Dementia  -Talked to them extensively about her home living situation.  Apparently, the patient's husband has dementia that is worse than the patient's.  She should not be living in a situation such as this.  Her husband is resistant to caregivers coming in the home.  They have looked into [facility] and apparently can afford this, but her husband does not wish to leave.  I told the patient that I need them to make some changes.  I really do not want to call it dull protective services, but wanted them to make these changes on their own.  Much greater than 50% of this 40 minute visit was spent in counseling with the patient and her family.  6.  Follow up is anticipated in the next 2 weeks

## 2015-11-03 ENCOUNTER — Encounter: Payer: Self-pay | Admitting: Neurology

## 2015-11-03 ENCOUNTER — Ambulatory Visit (INDEPENDENT_AMBULATORY_CARE_PROVIDER_SITE_OTHER): Payer: Medicare Other | Admitting: Neurology

## 2015-11-03 VITALS — BP 110/60 | HR 78 | Ht 60.0 in | Wt 115.0 lb

## 2015-11-03 DIAGNOSIS — G20 Parkinson's disease: Secondary | ICD-10-CM

## 2015-11-03 DIAGNOSIS — F0280 Dementia in other diseases classified elsewhere without behavioral disturbance: Secondary | ICD-10-CM

## 2015-11-03 NOTE — Progress Notes (Signed)
Psychosocial/Social Work Assessment [facility], [facility]  Patient: Victoria Lindsey MRN: [id number] Date of Assessment: 11/03/2015  PHYSICAL/MEDICAL SITUATION  Current Movement Disorder Diagnosis?:  Idiopathic Parkinson's disease  Goal(s) for Attending Clinic:  Follow up   DEMOGRAPHIC INFORMATION  Race/Ethnicity: Caucasian  Age:82 y.o.  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     Any cognitive changes (as reported by patient or care partner)? Daughter reports cognitive changes especially in regard to short term memory loss.   AMBULATION   Answer the following as Yes (Y) or No (N)   Has balance issues   Has falls  Frequency:  Last reported fall in June     AMBULATION ASSISTANCE   Answer the following as Yes (Y) or No (N)   Uses a rollator   Activities of Daily Living (ADLs)  Independent in activities of daily living?  Yes  No  Some   Impaired Activities:   Bathing: needs +1 assist  Dressing: modified independent  Toileting: supervision  Transferring: assistance needed by using rollator  How many stories is the home?  1 story home On what story are primary living areas? N/A  Feel safe in the home?     Driving  Currently driving? No   Comments:  Patient does not currently drive   EXERCISE AND ACTIVITY ENGAGEMENT  Information about Current Exercise    Types of exercise: Currently receiving [facility] PT/OT        Past Rehabilitation or Therapy     Physical Therapy Does pt need referral for? Currently active  Occupational therapy:  Does pt need referral for? Currently active  Speech therapy:  Does pt need referral for?      MOOD, COPING, AND SUPPORT        Mood and Affect      Up and Down     Other:       Perception of Coping: Pt coping with changes in independence and need for more care in the home vs. Transitioning into ALF.  Partner's coping: Pt husband not present during appointment, but pt and pt daughter report that pt husband resistant to additional care in the home.   Coping Method  Keep stress to him/herself  Talk with family  Attends support group?  (Yes (Y) or No (N)) No  Family live nearby? Daughter lives 30 minutes away  Use in-home care? Not currently  Support System    Good   Other/Comments: Very supportive daughter         [place]- per chart review, Yes  Living Will- per chart review,Yes  Other:  Interested in/provided with handouts today:N/A   SOCIAL WORK ASSISTANCE  Overall Impression:   CSW met with pt and pt daughter today during pt follow up appointment at the request of Dr. Carles Collet and Dr. Carles Collet recommendation about discussing additional assistance in the home or transition to higher level of care.   CSW met with pt and pt daughter in exam room. CSW introduced self and explained role. Pt alert and oriented to person, place, time, and situation, but pt daughter  reports that pt has dementia and has difficulty with short term memory loss. Pt engaged in conversation, but pt daughter provides history. Pt daughter shared that pt is married and lives in the home with her husband. Pt daughter reports that pt husband also suffers from memory issues. Pt home is a one level home. Pt daughter discussed that pt is beginning to require more assistance from pt husband and both pt and pt husband have had falls in the home. Pt most recent fall was in June per pt daughter. Pt daughter has discussed with pt husband about need for additional assistance, but pt husband has not been willing to allow additional assistance in the home. Additionally, pt daughter has inquired with [facility] about pt and  pt husband moving to [place] and pt and pt husband went to tour, but pt husband does not want to leave the home. Pt daughter states that they have had a private duty care agency aide come to the home, but it was not the right fit and did not continue. Pt daughter reports that they have gotten quotes for bathroom remodel to make pt bathroom handicap accessible. CSW provided support to pt daughter as she discussed that she is unsure what to expect with pt parkinson's disease and would prefer to have plans in place either in the home or for a transition to a facility, but pt husband resists conversations regarding these plans.  CSW assessed pt feeling surrounding the situation and pt feels that additional care in the home would be beneficial especially in regard to bathing and dressing. CSW discussed private in home care vs. ALF care and discussed benefits of care in an ALF. Pt is open to additional care, but is unsure that pt husband would be.   CSW discussed with pt and pt daughter strategies in discussing having additional care in the home. CSW discussed the options to present to pt husband including private duty in home care and assisted living facility placement. CSW recommended approaching conversation phrasing it as "the doctor is concerned about safety and is recommending...". CSW encouraged pt to share the feelings with her husband that pt shared with this CSW about what assistance she feels is needed. Pt agreeable to discuss her feelings with pt husband as pt has never expressed to pt husband that she feels assistance would be beneficial. CSW inquired if it would be helpful for pt husband to attend next visit, but pt and pt daughter did not feel that would be beneficial to the situation.   Pt and pt daughter agree to discuss the concerns for safety and recommendation for additional care in the home together upon their return home and pt shared that once pt daughter leaves that she will share her  feelings with pt husband about what she feels may be helpful in the home.  CSW provided pt daughter with list of private duty care agencies. Pt daughter is familiar with [facility] and is hopeful that pt husband will be in agreement to allow private duty care aide to come into the home to begin assisting pt with care.   Pt is due for follow up visit in 2 weeks and CSW will follow up with pt and pt daughter at that time to determine what plan pt and pt family have determined with best suit their needs.   CSW provided pt daughter CSW contact information and encouraged pt daughter to contact CSW if any needs or additional connection to resources are needed  before next visit.     Social Work Treatment Plan:  1. CSW provided private duty care agency list and encouraged consideration of private duty care in the home. 2. CSW to follow up with pt and pt daughter in two weeks to assess pt and pt family plan of care following pt and pt daughter discussion with pt husband. 3. Pt and pt daughter have been informed of social work services (including connection to [employer] or [facility], clinical (histocompatibility and immunogenetics) and short-term coping/problem-solving counseling) and has agreed to contact me if I can be of further assistance with any other social work services.  5. The information contained in this note has been reported to, and discussed with, Dr. Carles Collet.      Alison Murray, MSW, LCSW Clinical Social Worker [facility] [phone]

## 2015-11-03 NOTE — Patient Instructions (Signed)
Increase Carbidopa levodopa as follows:  Take 1 tablet at 7 am, 1/2 tablet at 11 am, 1 tablet at 4 pm If doing okay in one week increase to: 1 tablet at 7 am, 1 tablet at 11 am, 1 tablet at 4 pm  If you feel jittery, you can take an extra 1/2 tablet of Levodopa dissolved in ginger ale or chewed to see if this helps.   Follow up in 2 weeks.

## 2015-11-04 DIAGNOSIS — M6281 Muscle weakness (generalized): Secondary | ICD-10-CM | POA: Diagnosis not present

## 2015-11-04 DIAGNOSIS — I5033 Acute on chronic diastolic (congestive) heart failure: Secondary | ICD-10-CM | POA: Diagnosis not present

## 2015-11-04 DIAGNOSIS — L89522 Pressure ulcer of left ankle, stage 2: Secondary | ICD-10-CM | POA: Diagnosis not present

## 2015-11-04 DIAGNOSIS — R2681 Unsteadiness on feet: Secondary | ICD-10-CM | POA: Diagnosis not present

## 2015-11-04 DIAGNOSIS — G20 Parkinson's disease: Secondary | ICD-10-CM | POA: Diagnosis not present

## 2015-11-05 ENCOUNTER — Telehealth: Payer: Self-pay | Admitting: Cardiology

## 2015-11-05 DIAGNOSIS — M3501 Sicca syndrome with keratoconjunctivitis: Secondary | ICD-10-CM | POA: Diagnosis not present

## 2015-11-05 DIAGNOSIS — H16213 Exposure keratoconjunctivitis, bilateral: Secondary | ICD-10-CM | POA: Diagnosis not present

## 2015-11-05 MED ORDER — FUROSEMIDE 40 MG PO TABS: 40.0000 mg | ORAL_TABLET | Freq: Every day | ORAL | 2 refills | Status: DC

## 2015-11-05 NOTE — Telephone Encounter (Signed)
New message    *STAT* If patient is at the pharmacy, call can be transferred to refill team.   1. Which medications need to be refilled? (please list name of each medication and dose if known) Furosemide 40mg  2. Which pharmacy/location (including street and city if local pharmacy) is medication to be sent to? [address] [place]  3. Do they need a 30 day or 90 day supply? [place]

## 2015-11-07 DIAGNOSIS — L89522 Pressure ulcer of left ankle, stage 2: Secondary | ICD-10-CM | POA: Diagnosis not present

## 2015-11-07 DIAGNOSIS — R2681 Unsteadiness on feet: Secondary | ICD-10-CM | POA: Diagnosis not present

## 2015-11-07 DIAGNOSIS — I5033 Acute on chronic diastolic (congestive) heart failure: Secondary | ICD-10-CM | POA: Diagnosis not present

## 2015-11-07 DIAGNOSIS — M6281 Muscle weakness (generalized): Secondary | ICD-10-CM | POA: Diagnosis not present

## 2015-11-07 DIAGNOSIS — G20 Parkinson's disease: Secondary | ICD-10-CM | POA: Diagnosis not present

## 2015-11-09 DIAGNOSIS — G20 Parkinson's disease: Secondary | ICD-10-CM | POA: Diagnosis not present

## 2015-11-09 DIAGNOSIS — L89522 Pressure ulcer of left ankle, stage 2: Secondary | ICD-10-CM | POA: Diagnosis not present

## 2015-11-09 DIAGNOSIS — I5033 Acute on chronic diastolic (congestive) heart failure: Secondary | ICD-10-CM | POA: Diagnosis not present

## 2015-11-09 DIAGNOSIS — R2681 Unsteadiness on feet: Secondary | ICD-10-CM | POA: Diagnosis not present

## 2015-11-09 DIAGNOSIS — M6281 Muscle weakness (generalized): Secondary | ICD-10-CM | POA: Diagnosis not present

## 2015-11-11 DIAGNOSIS — R2681 Unsteadiness on feet: Secondary | ICD-10-CM | POA: Diagnosis not present

## 2015-11-11 DIAGNOSIS — G20 Parkinson's disease: Secondary | ICD-10-CM | POA: Diagnosis not present

## 2015-11-11 DIAGNOSIS — L89522 Pressure ulcer of left ankle, stage 2: Secondary | ICD-10-CM | POA: Diagnosis not present

## 2015-11-11 DIAGNOSIS — M6281 Muscle weakness (generalized): Secondary | ICD-10-CM | POA: Diagnosis not present

## 2015-11-11 DIAGNOSIS — I5033 Acute on chronic diastolic (congestive) heart failure: Secondary | ICD-10-CM | POA: Diagnosis not present

## 2015-11-13 ENCOUNTER — Other Ambulatory Visit: Payer: Self-pay | Admitting: Internal Medicine

## 2015-11-13 DIAGNOSIS — R2681 Unsteadiness on feet: Secondary | ICD-10-CM | POA: Diagnosis not present

## 2015-11-13 DIAGNOSIS — L89522 Pressure ulcer of left ankle, stage 2: Secondary | ICD-10-CM | POA: Diagnosis not present

## 2015-11-13 DIAGNOSIS — M6281 Muscle weakness (generalized): Secondary | ICD-10-CM | POA: Diagnosis not present

## 2015-11-13 DIAGNOSIS — I5033 Acute on chronic diastolic (congestive) heart failure: Secondary | ICD-10-CM | POA: Diagnosis not present

## 2015-11-13 DIAGNOSIS — G20 Parkinson's disease: Secondary | ICD-10-CM | POA: Diagnosis not present

## 2015-11-14 DIAGNOSIS — G20 Parkinson's disease: Secondary | ICD-10-CM | POA: Diagnosis not present

## 2015-11-14 DIAGNOSIS — L89522 Pressure ulcer of left ankle, stage 2: Secondary | ICD-10-CM | POA: Diagnosis not present

## 2015-11-14 DIAGNOSIS — I5033 Acute on chronic diastolic (congestive) heart failure: Secondary | ICD-10-CM | POA: Diagnosis not present

## 2015-11-14 DIAGNOSIS — M6281 Muscle weakness (generalized): Secondary | ICD-10-CM | POA: Diagnosis not present

## 2015-11-14 DIAGNOSIS — R2681 Unsteadiness on feet: Secondary | ICD-10-CM | POA: Diagnosis not present

## 2015-11-17 DIAGNOSIS — R2681 Unsteadiness on feet: Secondary | ICD-10-CM | POA: Diagnosis not present

## 2015-11-17 DIAGNOSIS — M6281 Muscle weakness (generalized): Secondary | ICD-10-CM | POA: Diagnosis not present

## 2015-11-17 DIAGNOSIS — L89522 Pressure ulcer of left ankle, stage 2: Secondary | ICD-10-CM | POA: Diagnosis not present

## 2015-11-17 DIAGNOSIS — G20 Parkinson's disease: Secondary | ICD-10-CM | POA: Diagnosis not present

## 2015-11-17 DIAGNOSIS — I5033 Acute on chronic diastolic (congestive) heart failure: Secondary | ICD-10-CM | POA: Diagnosis not present

## 2015-11-18 ENCOUNTER — Ambulatory Visit: Payer: Medicare Other | Admitting: Neurology

## 2015-11-18 DIAGNOSIS — L89522 Pressure ulcer of left ankle, stage 2: Secondary | ICD-10-CM | POA: Diagnosis not present

## 2015-11-18 DIAGNOSIS — G20 Parkinson's disease: Secondary | ICD-10-CM | POA: Diagnosis not present

## 2015-11-18 DIAGNOSIS — R2681 Unsteadiness on feet: Secondary | ICD-10-CM | POA: Diagnosis not present

## 2015-11-18 DIAGNOSIS — M6281 Muscle weakness (generalized): Secondary | ICD-10-CM | POA: Diagnosis not present

## 2015-11-18 DIAGNOSIS — I5033 Acute on chronic diastolic (congestive) heart failure: Secondary | ICD-10-CM | POA: Diagnosis not present

## 2015-11-19 DIAGNOSIS — L89522 Pressure ulcer of left ankle, stage 2: Secondary | ICD-10-CM | POA: Diagnosis not present

## 2015-11-19 DIAGNOSIS — R2681 Unsteadiness on feet: Secondary | ICD-10-CM | POA: Diagnosis not present

## 2015-11-19 DIAGNOSIS — M6281 Muscle weakness (generalized): Secondary | ICD-10-CM | POA: Diagnosis not present

## 2015-11-19 DIAGNOSIS — I5033 Acute on chronic diastolic (congestive) heart failure: Secondary | ICD-10-CM | POA: Diagnosis not present

## 2015-11-19 DIAGNOSIS — G20 Parkinson's disease: Secondary | ICD-10-CM | POA: Diagnosis not present

## 2015-11-19 NOTE — Progress Notes (Signed)
Victoria Lindsey was seen today in the movement disorders clinic for neurologic consultation at the request of REED, TIFFANY, DO.    I have reviewed prior records made available to me.  She is accompanied by her daughter supplements the history.  She previously saw Dr. Baltazar Najjar at [facility] and last saw her on 07/21/2015.  While I have her last note, I do not have records prior to that.  The patient reports that she was diagnosed with Parkinson's disease approximately between 5 and 10 years ago (she cannot remember).  She has trouble recalling her first sx but her daughter thinks that hypophonia may have been a first sx.  Records indicate she has been on a number of Parkinson's medications.  It stated that she did not tolerate levodopa (GI upset), Requip, Mirapex or amantadine.  The side effects with these other medications is not mentioned within Dr. Anson Crofts most recent note.  She is on neupro 8 mg and has been on that for several years.  She tried to d/c it once and sx's got worse.  She is also on azilect 1 mg daily.  The patient is on gabapentin, 100mg  q hs for restless leg syndrome.  Pt had a CT brain on 06/20/15 that I reviewed.  Mod atrophy and mild WMD  11/03/15 update:  Pt f/u today for PD, accompanied by her daughter, who supplements the history.  The records that were made available to me were reviewed since last visit.  I was able to get her started on levodopa and she has worked to 1 po bid (8-10am/7-9pm).  Her daughter has called a few times.  She seemed excessively sleepy and we ended up d/c her neupro on 8/15.  Her daughter called on 8/22 to state that pt is more restless and anxious and they are f/u today in that regard.  somedays she has this all day long and some days it is just part of the day.  She thinks that it is more evening but states that it isn't anxiety and has trouble describing it.  May be jitteriness.  No falls but not walking as well since d/c the neupro.  No hallucinations.   Husband has dementia and memory worse than pts per daughter but he is resistant to getting help or leaving the home.  Addressed with him multiple times per daughter.  Had MBE on 09/16/15 with mild pharyngeal phase dysphagia and suspected primary esophageal dysphagia and regular diet with thin liquids was recommended.  11/21/15 update:  The patient follows up today, accompanied by her daughter who supplements the history.  I wanted her to work up to carbidopa/levodopa 25/100 tid but she is taking 1/0.5/1.   I told her she can take an extra half tablet if she needs, particularly for restless leg and she hasn't needed that.  She thinks that RLS is somewhat better.   She is having trouble with sleep, both getting to and staying sleep.  Last visit, I talked to her extensively about her home living situation.  Her husband is very resistant to leaving the home and he has worse memory problems than she does.  Daughter states that pts husband refused to come and meet "that doctor" today.  Daughter states that they have had lots of conversations and he seems a bit open to independent assisted living with [facility].  They had someone from [employer] [facility] come to the house and talk with them about options.   PREVIOUS  MEDICATIONS: Sinemet, Mirapex, Requip, Amantadine and Neupro  ALLERGIES:   Allergies  Allergen Reactions  . Aciphex [Rabeprazole Sodium]     unknown  . Amantadine Other (See Comments)    Pt cannot remember  . Amantadine Hcl     unknown  . Amantadines     unknown  . Avapro [Irbesartan]     unknown  . Carbidopa-Levodopa     unknown  . Cardura [Doxazosin Mesylate]     unknown  . Ciprofloxacin     unknown  . Clonidine Derivatives     unknown  . Cozaar     nightmares  . Diltiazem     unknown  . Famotidine     unknown  . Hctz [Hydrochlorothiazide]     unknown  . Indapamide     unknown  . Lansoprazole     unknown  . Lisinopril     Mouth problems  . Loratadine      unknown  . Maxidex [Dexamethasone]     unknown  . Mirapex [Pramipexole Dihydrochloride]     unknown  . Norvasc [Amlodipine Besylate]     unknown  . Pantoprazole Sodium     unknown  . Prilosec [Omeprazole]     unknown  . Procardia [Nifedipine]     unknown  . Proton Pump Inhibitors     unknown  . Sulfa Drugs Cross Reactors     unknown  . Trimethoprim     unknown  . Zyrtec [Cetirizine Hcl]     unknown  . Metronidazole Rash  . Penicillins Rash    Has patient had a PCN reaction causing immediate rash, facial/tongue/throat swelling, SOB or lightheadedness with hypotension: just a rash all over Has patient had a PCN reaction causing severe rash involving mucus membranes or skin necrosis: no Has patient had a PCN reaction that required hospitalization-no Has patient had a PCN reaction occurring within the last 10 years: no, more than 50 yrs ago If all of the above answers are "NO", then may proceed with Cephalosporin use.     CURRENT MEDICATIONS:  Outpatient Encounter Prescriptions as of 11/21/2015  Medication Sig  . aspirin 81 MG tablet Take 81 mg by mouth every evening.   Marland Kitchen BIOTIN PO Take 1,000 mcg by mouth daily.   . carbidopa-levodopa (SINEMET IR) 25-100 MG tablet Take 1 tablet by mouth 2 (two) times daily.  . Cholecalciferol (VITAMIN D3) 2000 UNITS TABS Take 1 tablet by mouth daily.   . clopidogrel (PLAVIX) 75 MG tablet Take 1 tablet (75 mg total) by mouth every evening.  . Cranberry 500 MG CAPS Take 500 mg by mouth daily.   Marland Kitchen docusate sodium (COLACE) 100 MG capsule Take 100 mg by mouth 2 (two) times daily.   . folic acid (FOLVITE) Q000111Q MCG tablet Take 800 mcg by mouth daily.   . furosemide (LASIX) 40 MG tablet Take 1 tablet (40 mg total) by mouth daily.  Marland Kitchen gabapentin (NEURONTIN) 100 MG capsule Take 100 mg by mouth at bedtime. One additional PRN  . metoprolol (LOPRESSOR) 100 MG tablet Take 1 tablet (100 mg total) by mouth 2 (two) times daily.  . Multiple Vitamin (MULTIVITAMIN  WITH MINERALS) TABS tablet Take 1 tablet by mouth daily.  Marland Kitchen POLY-IRON 150 150 MG capsule Take ONE (1) capsule by mouth daily  . Polyethyl Glycol-Propyl Glycol (SYSTANE OP) Apply 1 drop to eye 2 (two) times daily as needed (dryness).  . rasagiline (AZILECT) 1 MG TABS Take 1 mg by mouth daily.  Marland Kitchen  spironolactone (ALDACTONE) 25 MG tablet Take 1 tablet (25 mg total) by mouth daily.  Marland Kitchen SYNTHROID 175 MCG tablet Take ONE (1) tablet by mouth daily before breakfast   No facility-administered encounter medications on file as of 11/21/2015.     PAST MEDICAL HISTORY:   Past Medical History:  Diagnosis Date  . Anemia   . Breast cancer ([place])    LEFT BREAST  . Dizziness   . Hyperglycemia   . Hyperlipidemia   . Hypertension   . Hypophonia   . Hypothyroidism   . Kidney failure    stage 3  . Left ventricular diastolic dysfunction   . Orthostatic hypotension   . Parkinson's disease   . Prediabetes   . TIA (transient ischemic attack)   . Tremor     PAST SURGICAL HISTORY:   Past Surgical History:  Procedure Laterality Date  . BREAST LUMPECTOMY  2010  . CARDIOVASCULAR STRESS TEST  06/2007   NORMAL  . CATARACT EXTRACTION  2005,2006   Dr.Devonzo  . CHOLECYSTECTOMY  1994  . NASAL SINUS SURGERY     submucous resection late 1950s  . TONSILLECTOMY      SOCIAL HISTORY:   Social History   Social History  . Marital status: Married    Spouse name: N/A  . Number of children: N/A  . Years of education: N/A   Occupational History  . retired     [employer] (but retired for 40+ years)   Social History Main Topics  . Smoking status: Former Smoker    Packs/day: 0.50    Years: 12.00    Types: Cigarettes    Quit date: 03/08/1964  . Smokeless tobacco: Never Used  . Alcohol use No  . Drug use: No  . Sexual activity: Not on file   Other Topics Concern  . Not on file   Social History Narrative   Quit smoking in 1967   Caffeine: Tea and chocolate    Married since 1951   Lives in a house,  1 stories, 2 persons, no pets   Current or past profession- homemaker   Exercise- Yes, Physical Therapy, Occupational Therapy   Living Will-Yes   POA/HPOA-Yes     FAMILY HISTORY:   Family Status  Relation Status  . Mother Deceased at age 40   heart disease  . Father Deceased at age 37   heart disease, parkinsonism  . Brother Deceased at age 65   MVA  . Sister Deceased   unknown cause of death  . Brother Deceased   prostate CA  . Brother Deceased at age 55   "neurological" but ? cause  . Daughter Alive    ROS:  A complete 10 system review of systems was obtained and was unremarkable apart from what is mentioned above.  PHYSICAL EXAMINATION:    VITALS:   There were no vitals filed for this visit.  GEN:  The patient appears stated age and is in NAD. HEENT:  Normocephalic, atraumatic.  The mucous membranes are moist. The superficial temporal arteries are without ropiness or tenderness. CV:  RRR Lungs:  CTAB Neck/HEME:  There are no carotid bruits bilaterally.  Neurological examination:  Orientation: She is oriented today x 3 Montreal Cognitive Assessment  11/03/2015  Visuospatial/ Executive (0/5) 4  Naming (0/3) 2  Attention: Read list of digits (0/2) 2  Attention: Read list of letters (0/1) 1  Attention: Serial 7 subtraction starting at 100 (0/3) 0  Language: Repeat phrase (0/2) 0  Language :  Fluency (0/1) 0  Abstraction (0/2) 2  Delayed Recall (0/5) 0  Orientation (0/6) 4  Total 15  Adjusted Score (based on education) 15   Cranial nerves: There is good facial symmetry.  There is facial hypomimia. The speech is fluent and clear but she is hypophonic. Soft palate rises symmetrically and there is no tongue deviation. Hearing is intact to conversational tone. Sensation: Sensation is intact to light touch throughout Motor: Strength is 5/5 in the bilateral upper and lower extremities.   Shoulder shrug is equal and symmetric.  There is no pronator drift.   Movement  examination: Tone: There is normal tone in the bilateral upper extremities.  The tone in the lower extremities is normal.  Abnormal movements: There is no tremor today Coordination:  There is decremation with RAM's, with heel taps/toe taps bilaterally, L more than R Gait and Station: The patient has difficulty arising out of a deep-seated chair without the use of the hands and makes several attempts but is unable.   The patient's stride length is good with the rollator  ASSESSMENT/PLAN:  1.  Idiopathic Parkinson's disease.   -We discussed the diagnosis as well as pathophysiology of the disease.  We discussed treatment options as well as prognostic indicators.  Patient education was provided.  -Greater than 50% of the 25 minute visit was spent in counseling answering questions and talking about what to expect now as well as in the future.  We talked about medication options as well as potential future surgical options.  We talked about safety in the home.   - Prior records indicate pt didn't tolerate levodopa (GI upset) Requip, Mirapex or amantadine.   Have been able to get her to tolerate levodopa by starting slow.  I d/c the neupro secondary to cognitive change.  Has been able to tolerate levodopa now with slow titration and will continue the increase to carbidopa/levodopa 25/100 tid.  Risks, benefits, side effects and alternative therapies were discussed.  The opportunity to ask questions was given and they were answered to the best of my ability.  The patient expressed understanding and willingness to follow the outlined treatment protocols.  -We discussed community assisted living facilities and her daughter has begun to investigate these.  Gave names of several other places for her to investigate.  2.  Sialorrhea  -only at night but she has trouble with dry mouth in day because of sjogrens syndrome.    -talked about proper hydration as she isn't drinking   3.  Dysphagia  -Had MBE on 09/16/15  with mild pharyngeal phase dysphagia and suspected primary esophageal dysphagia and regular diet with thin liquids was recommended.  4.  HTN  -talked to her about lopressor.  Want to keep close eye on BP as PD will lower this as well.   Having lightheadedness.   F/u with Dr Marlou Porch in this regard.    5.  Parkinson's disease Dementia  -as above, daughter checking into assisted living places but husband resistant.  Has information on [facility] if they choose home caregiving instead  6.  Insomnia  -start melatonin 3 mg q hs  7.  Follow up is anticipated in the next few months, sooner should new neurologic issues arise.

## 2015-11-21 ENCOUNTER — Encounter: Payer: Self-pay | Admitting: Neurology

## 2015-11-21 ENCOUNTER — Ambulatory Visit (INDEPENDENT_AMBULATORY_CARE_PROVIDER_SITE_OTHER): Payer: Medicare Other | Admitting: Neurology

## 2015-11-21 VITALS — BP 140/80 | HR 74 | Ht 60.0 in | Wt 112.0 lb

## 2015-11-21 DIAGNOSIS — G4700 Insomnia, unspecified: Secondary | ICD-10-CM | POA: Diagnosis not present

## 2015-11-21 DIAGNOSIS — F0280 Dementia in other diseases classified elsewhere without behavioral disturbance: Secondary | ICD-10-CM

## 2015-11-21 DIAGNOSIS — G20 Parkinson's disease: Secondary | ICD-10-CM

## 2015-11-21 MED ORDER — CARBIDOPA-LEVODOPA 25-100 MG PO TABS: 1.0000 | ORAL_TABLET | Freq: Three times a day (TID) | ORAL | 2 refills | Status: DC

## 2015-11-21 NOTE — Patient Instructions (Signed)
Start melatonin 3 mg 30 minutes before bedtime Take carbidopa/levodopa 25/100 at 8am/12pm and 4pm

## 2015-11-24 DIAGNOSIS — M6281 Muscle weakness (generalized): Secondary | ICD-10-CM | POA: Diagnosis not present

## 2015-11-24 DIAGNOSIS — L89522 Pressure ulcer of left ankle, stage 2: Secondary | ICD-10-CM | POA: Diagnosis not present

## 2015-11-24 DIAGNOSIS — I5033 Acute on chronic diastolic (congestive) heart failure: Secondary | ICD-10-CM | POA: Diagnosis not present

## 2015-11-24 DIAGNOSIS — R2681 Unsteadiness on feet: Secondary | ICD-10-CM | POA: Diagnosis not present

## 2015-11-24 DIAGNOSIS — G20 Parkinson's disease: Secondary | ICD-10-CM | POA: Diagnosis not present

## 2015-11-25 DIAGNOSIS — R2681 Unsteadiness on feet: Secondary | ICD-10-CM | POA: Diagnosis not present

## 2015-11-25 DIAGNOSIS — L89522 Pressure ulcer of left ankle, stage 2: Secondary | ICD-10-CM | POA: Diagnosis not present

## 2015-11-25 DIAGNOSIS — G20 Parkinson's disease: Secondary | ICD-10-CM | POA: Diagnosis not present

## 2015-11-25 DIAGNOSIS — I5033 Acute on chronic diastolic (congestive) heart failure: Secondary | ICD-10-CM | POA: Diagnosis not present

## 2015-11-25 DIAGNOSIS — M6281 Muscle weakness (generalized): Secondary | ICD-10-CM | POA: Diagnosis not present

## 2015-11-26 DIAGNOSIS — M6281 Muscle weakness (generalized): Secondary | ICD-10-CM | POA: Diagnosis not present

## 2015-11-26 DIAGNOSIS — I5033 Acute on chronic diastolic (congestive) heart failure: Secondary | ICD-10-CM | POA: Diagnosis not present

## 2015-11-26 DIAGNOSIS — L89522 Pressure ulcer of left ankle, stage 2: Secondary | ICD-10-CM | POA: Diagnosis not present

## 2015-11-26 DIAGNOSIS — G20 Parkinson's disease: Secondary | ICD-10-CM | POA: Diagnosis not present

## 2015-11-26 DIAGNOSIS — R2681 Unsteadiness on feet: Secondary | ICD-10-CM | POA: Diagnosis not present

## 2015-11-28 DIAGNOSIS — M19041 Primary osteoarthritis, right hand: Secondary | ICD-10-CM | POA: Diagnosis not present

## 2015-11-28 DIAGNOSIS — M3500 Sicca syndrome, unspecified: Secondary | ICD-10-CM | POA: Diagnosis not present

## 2015-11-28 DIAGNOSIS — L89522 Pressure ulcer of left ankle, stage 2: Secondary | ICD-10-CM | POA: Diagnosis not present

## 2015-11-28 DIAGNOSIS — R300 Dysuria: Secondary | ICD-10-CM | POA: Diagnosis not present

## 2015-11-28 DIAGNOSIS — M19042 Primary osteoarthritis, left hand: Secondary | ICD-10-CM | POA: Diagnosis not present

## 2015-11-28 DIAGNOSIS — G20 Parkinson's disease: Secondary | ICD-10-CM | POA: Diagnosis not present

## 2015-11-28 DIAGNOSIS — I5033 Acute on chronic diastolic (congestive) heart failure: Secondary | ICD-10-CM | POA: Diagnosis not present

## 2015-11-28 DIAGNOSIS — R2681 Unsteadiness on feet: Secondary | ICD-10-CM | POA: Diagnosis not present

## 2015-11-28 DIAGNOSIS — M6281 Muscle weakness (generalized): Secondary | ICD-10-CM | POA: Diagnosis not present

## 2015-11-28 DIAGNOSIS — R52 Pain, unspecified: Secondary | ICD-10-CM | POA: Diagnosis not present

## 2015-11-28 DIAGNOSIS — M2550 Pain in unspecified joint: Secondary | ICD-10-CM | POA: Diagnosis not present

## 2015-12-01 DIAGNOSIS — I5033 Acute on chronic diastolic (congestive) heart failure: Secondary | ICD-10-CM | POA: Diagnosis not present

## 2015-12-01 DIAGNOSIS — G20 Parkinson's disease: Secondary | ICD-10-CM | POA: Diagnosis not present

## 2015-12-01 DIAGNOSIS — L89522 Pressure ulcer of left ankle, stage 2: Secondary | ICD-10-CM | POA: Diagnosis not present

## 2015-12-01 DIAGNOSIS — R2681 Unsteadiness on feet: Secondary | ICD-10-CM | POA: Diagnosis not present

## 2015-12-01 DIAGNOSIS — M6281 Muscle weakness (generalized): Secondary | ICD-10-CM | POA: Diagnosis not present

## 2015-12-02 DIAGNOSIS — I5033 Acute on chronic diastolic (congestive) heart failure: Secondary | ICD-10-CM | POA: Diagnosis not present

## 2015-12-02 DIAGNOSIS — L89522 Pressure ulcer of left ankle, stage 2: Secondary | ICD-10-CM | POA: Diagnosis not present

## 2015-12-02 DIAGNOSIS — G20 Parkinson's disease: Secondary | ICD-10-CM | POA: Diagnosis not present

## 2015-12-02 DIAGNOSIS — M6281 Muscle weakness (generalized): Secondary | ICD-10-CM | POA: Diagnosis not present

## 2015-12-02 DIAGNOSIS — R2681 Unsteadiness on feet: Secondary | ICD-10-CM | POA: Diagnosis not present

## 2015-12-03 DIAGNOSIS — G20 Parkinson's disease: Secondary | ICD-10-CM | POA: Diagnosis not present

## 2015-12-03 DIAGNOSIS — R2681 Unsteadiness on feet: Secondary | ICD-10-CM | POA: Diagnosis not present

## 2015-12-03 DIAGNOSIS — M6281 Muscle weakness (generalized): Secondary | ICD-10-CM | POA: Diagnosis not present

## 2015-12-03 DIAGNOSIS — M3501 Sicca syndrome with keratoconjunctivitis: Secondary | ICD-10-CM | POA: Diagnosis not present

## 2015-12-03 DIAGNOSIS — L89522 Pressure ulcer of left ankle, stage 2: Secondary | ICD-10-CM | POA: Diagnosis not present

## 2015-12-03 DIAGNOSIS — H16213 Exposure keratoconjunctivitis, bilateral: Secondary | ICD-10-CM | POA: Diagnosis not present

## 2015-12-03 DIAGNOSIS — I5033 Acute on chronic diastolic (congestive) heart failure: Secondary | ICD-10-CM | POA: Diagnosis not present

## 2015-12-04 ENCOUNTER — Encounter: Payer: Self-pay | Admitting: Nurse Practitioner

## 2015-12-04 ENCOUNTER — Ambulatory Visit (INDEPENDENT_AMBULATORY_CARE_PROVIDER_SITE_OTHER): Payer: Medicare Other | Admitting: Nurse Practitioner

## 2015-12-04 VITALS — BP 128/78 | HR 80 | Temp 97.5°F | Resp 18 | Ht 60.0 in | Wt 111.6 lb

## 2015-12-04 DIAGNOSIS — N390 Urinary tract infection, site not specified: Secondary | ICD-10-CM | POA: Diagnosis not present

## 2015-12-04 DIAGNOSIS — R82998 Other abnormal findings in urine: Secondary | ICD-10-CM

## 2015-12-04 DIAGNOSIS — K5901 Slow transit constipation: Secondary | ICD-10-CM

## 2015-12-04 NOTE — Progress Notes (Signed)
Careteam: Patient Care Team: Victoria Curry, DO as PCP - General (Geriatric Medicine) Victoria Lindsey. Baird Cancer, MD as Referring Physician (Hematology and Oncology) Victoria Anis, MD as Referring Physician (Neurology) Victoria Merino, MD as Consulting Physician (Rheumatology) Victoria Coco, MD as Consulting Physician (Cardiology) Victoria Natal, MD as Referring Physician (Ophthalmology) Victoria Hawking, MD as Consulting Physician (Neurology) Victoria Sartorius, MD as Referring Physician (Urology) Victoria Delton, MD as Consulting Physician (Pulmonary Disease) Victoria Parish, MD as Consulting Physician (Nephrology)  Advanced Directive information Does patient have an advance directive?: Yes, Type of Advance Directive: [place];Living will  Allergies  Allergen Reactions  . Aciphex [Rabeprazole Sodium]     unknown  . Amantadine Other (See Comments)    Pt cannot remember  . Amantadine Hcl     unknown  . Amantadines     unknown  . Avapro [Irbesartan]     unknown  . Cardura [Doxazosin Mesylate]     unknown  . Ciprofloxacin     unknown  . Clonidine Derivatives     unknown  . Cozaar     nightmares  . Diltiazem     unknown  . Famotidine     unknown  . Hctz [Hydrochlorothiazide]     unknown  . Indapamide     unknown  . Lansoprazole     unknown  . Lisinopril     Mouth problems  . Loratadine     unknown  . Maxidex [Dexamethasone]     unknown  . Mirapex [Pramipexole Dihydrochloride]     unknown  . Norvasc [Amlodipine Besylate]     unknown  . Pantoprazole Sodium     unknown  . Prilosec [Omeprazole]     unknown  . Procardia [Nifedipine]     unknown  . Proton Pump Inhibitors     unknown  . Sulfa Drugs Cross Reactors     unknown  . Trimethoprim     unknown  . Zyrtec [Cetirizine Hcl]     unknown  . Metronidazole Rash  . Penicillins Rash    Has patient had a PCN reaction causing immediate rash, facial/tongue/throat swelling, SOB or  lightheadedness with hypotension: just a rash all over Has patient had a PCN reaction causing severe rash involving mucus membranes or skin necrosis: no Has patient had a PCN reaction that required hospitalization-no Has patient had a PCN reaction occurring within the last 10 years: no, more than 50 yrs ago If all of the above answers are "NO", then may proceed with Cephalosporin use.     Chief Complaint  Patient presents with  . Acute Visit    UTI; went to RA doctor and was told that urine specimen showed bacteria.      HPI: Patient is a 82 y.o. female seen in the office today due to possible UTI.  Went to see her rheumatologist last Friday and 2 days ago they called her and said the UA they sent out was abnormal and she needed to go to her PCP for follow up on this.  UA showing WBC, nitrites, and leukocytes  Daughter reports pt has hx of chronic UTI, last year was hospitalized for diarrhea (possible C diff) and since she has not had one.  Daughter reports increased confusion, chills and sweats, and neck pain.  No dysuria, no abdominal discomfort.  Has not taken temperature to know if she has had a fever.  Pt has been on keflex 250 mg daily for prophylactic in the  past, but urologist stopped this.   Having some constipation- afraid to take anything due to diarrhea last year.   Review of Systems:  Review of Systems  Constitutional: Negative for activity change, appetite change, chills and fever.  HENT: Negative for congestion.   Eyes:       Glasses  Respiratory: Negative for shortness of breath.   Cardiovascular: Negative for chest pain.  Gastrointestinal: Positive for constipation. Negative for abdominal pain and blood in stool.  Genitourinary: Positive for urgency (not new). Negative for dysuria, frequency and hematuria.  Musculoskeletal: Positive for gait problem. Negative for back pain, joint swelling and myalgias.  Skin:       Bruising   Neurological: Positive for tremors  and weakness. Negative for dizziness, syncope, facial asymmetry, speech difficulty, light-headedness and headaches.  Hematological: Bruises/bleeds easily.  Psychiatric/Behavioral: Negative for agitation, behavioral problems and confusion. The patient is not nervous/anxious.     Past Medical History:  Diagnosis Date  . Anemia   . Breast cancer ([place])    LEFT BREAST  . Dizziness   . Hyperglycemia   . Hyperlipidemia   . Hypertension   . Hypophonia   . Hypothyroidism   . Kidney failure    stage 3  . Left ventricular diastolic dysfunction   . Orthostatic hypotension   . Parkinson's disease   . Prediabetes   . TIA (transient ischemic attack)   . Tremor    Past Surgical History:  Procedure Laterality Date  . BREAST LUMPECTOMY  2010  . CARDIOVASCULAR STRESS TEST  06/2007   NORMAL  . CATARACT EXTRACTION  2005,2006   Dr.Devonzo  . CHOLECYSTECTOMY  1994  . NASAL SINUS SURGERY     submucous resection late 1950s  . TONSILLECTOMY     Social History:   reports that she quit smoking about 51 years ago. Her smoking use included Cigarettes. She has a 6.00 pack-year smoking history. She has never used smokeless tobacco. She reports that she does not drink alcohol or use drugs.  Family History  Problem Relation Age of Onset  . Heart disease Mother   . Heart failure Father   . Parkinsonism Father   . Diabetes Father   . COPD Sister   . Cancer Brother   . Osteoporosis Daughter   . Stroke Sister     Medications: Patient's Medications  New Prescriptions   No medications on file  Previous Medications   ASPIRIN 81 MG TABLET    Take 81 mg by mouth every evening.    BIOTIN PO    Take 1,000 mcg by mouth daily.    CARBIDOPA-LEVODOPA (SINEMET IR) 25-100 MG TABLET    Take 1 tablet by mouth 3 (three) times daily.   CHOLECALCIFEROL (VITAMIN D3) 2000 UNITS TABS    Take 1 tablet by mouth daily.    CLOPIDOGREL (PLAVIX) 75 MG TABLET    Take 1 tablet (75 mg total) by mouth every evening.    CRANBERRY 500 MG CAPS    Take 500 mg by mouth daily.    CYCLOSPORINE (RESTASIS) 0.05 % OPHTHALMIC EMULSION    Place 1 drop into both eyes 2 (two) times daily.   DOCUSATE SODIUM (COLACE) 100 MG CAPSULE    Take 100 mg by mouth 2 (two) times daily.    FOLIC ACID (FOLVITE) Q000111Q MCG TABLET    Take 800 mcg by mouth daily.    FUROSEMIDE (LASIX) 40 MG TABLET    Take 1 tablet (40 mg total) by mouth daily.  GABAPENTIN (NEURONTIN) 100 MG CAPSULE    Take 100 mg by mouth at bedtime. One additional PRN   MELATONIN 3 MG TBDP    Take 1 tablet by mouth daily.   METOPROLOL (LOPRESSOR) 100 MG TABLET    Take 1 tablet (100 mg total) by mouth 2 (two) times daily.   MULTIPLE VITAMIN (MULTIVITAMIN WITH MINERALS) TABS TABLET    Take 1 tablet by mouth daily.   POLY-IRON 150 150 MG CAPSULE    Take ONE (1) capsule by mouth daily   POLYETHYL GLYCOL-PROPYL GLYCOL (SYSTANE OP)    Apply 1 drop to eye 2 (two) times daily as needed (dryness).   RASAGILINE (AZILECT) 1 MG TABS    Take 1 mg by mouth daily.   SPIRONOLACTONE (ALDACTONE) 25 MG TABLET    Take 1 tablet (25 mg total) by mouth daily.   SYNTHROID 175 MCG TABLET    Take ONE (1) tablet by mouth daily before breakfast  Modified Medications   No medications on file  Discontinued Medications   No medications on file     Physical Exam:  Vitals:   12/04/15 1405  BP: 128/78  Pulse: 80  Resp: 18  Temp: 97.5 F (36.4 C)  TempSrc: Oral  SpO2: 95%  Weight: 111 lb 9.6 oz (50.6 kg)  Height: 5' (1.524 m)   Body mass index is 21.8 kg/m.  Physical Exam  Constitutional: She is oriented to person, place, and time. She appears well-developed and well-nourished. No distress.  Cardiovascular: Normal rate, regular rhythm and normal heart sounds.   Pulmonary/Chest: Effort normal and breath sounds normal.  Abdominal: Soft. Bowel sounds are normal. She exhibits no distension. There is no tenderness.  Musculoskeletal: Normal range of motion. She exhibits no edema.  Stooped  posture, walks with walker  Neurological: She is alert and oriented to person, place, and time.  Skin: Skin is warm and dry.  Psychiatric: She has a normal mood and affect.    Labs reviewed: Basic Metabolic Panel:  Recent Labs  04/21/15 0929 06/06/15 0936 06/20/15 1421 10/21/15 0909 10/24/15 1215  NA 139  --  139 141 140  K 4.8  --  4.6 4.4 4.2  CL 96  --  99* 101 102  CO2 25  --  28 29 25   GLUCOSE 98  --  134* 114* 99  BUN 36*  --  35* 44* 45*  CREATININE 1.10*  --  1.04* 1.37* 1.33*  CALCIUM 9.3  --  9.8 9.1 8.6  TSH 9.860* 5.620*  --  2.78  --    Liver Function Tests:  Recent Labs  01/25/15 1151 04/21/15 0929 10/21/15 0909  AST 75* 44* 50*  ALT 78* 36* 30*  ALKPHOS 176* 166* 150*  BILITOT 0.4 0.4 0.6  PROT 8.4* 7.1 6.9  ALBUMIN 4.2 3.9 3.8    Recent Labs  01/25/15 1151  LIPASE 33   No results for input(s): AMMONIA in the last 8760 hours. CBC:  Recent Labs  03/17/15 04/21/15 0929 06/20/15 1421 07/11/15 1000 10/21/15 0909  WBC 6.4 6.7 6.0 6.1 7.4  NEUTROABS  --  3.7  --  3.4 4,292  HGB 11.5*  --  11.5*  --  12.2  HCT 36 36.9 35.9* 35.4 37.8  MCV  --  96 100.3* 97 99.5  PLT 127* 148* 125* 132* 153   Lipid Panel:  Recent Labs  01/08/15 0806 10/21/15 0909  CHOL 112 200  HDL 39* 40*  LDLCALC 55 125  TRIG 89 175*  CHOLHDL 2.9 5.0   TSH:  Recent Labs  04/21/15 0929 06/06/15 0936 10/21/15 0909  TSH 9.860* 5.620* 2.78   A1C: Lab Results  Component Value Date   HGBA1C 6.2 (H) 10/21/2015     Assessment/Plan 1. Leukocytes in urine -increase hydration, will send off urine culture.  - Culture, Urine  2. Constipation - not well controlled on colace To increase hydration May use miralax 17 gm every other day  Jessica K. Harle Battiest  [facility] [phone] 8 am - 5 pm) [phone] (after hours)

## 2015-12-04 NOTE — Patient Instructions (Signed)
Will send urine out for culture Increase hydration Can use miralax 17 gm every other day for constipation Cont colace   Constipation, Adult Constipation is when a person has fewer than three bowel movements a week, has difficulty having a bowel movement, or has stools that are dry, hard, or larger than normal. As people grow older, constipation is more common. A low-fiber diet, not taking in enough fluids, and taking certain medicines may make constipation worse.  CAUSES   Certain medicines, such as antidepressants, pain medicine, iron supplements, antacids, and water pills.   Certain diseases, such as diabetes, irritable bowel syndrome (IBS), thyroid disease, or depression.   Not drinking enough water.   Not eating enough fiber-rich foods.   Stress or travel.   Lack of physical activity or exercise.   Ignoring the urge to have a bowel movement.   Using laxatives too much.  SIGNS AND SYMPTOMS   Having fewer than three bowel movements a week.   Straining to have a bowel movement.   Having stools that are hard, dry, or larger than normal.   Feeling full or bloated.   Pain in the lower abdomen.   Not feeling relief after having a bowel movement.  DIAGNOSIS  Your health care provider will take a medical history and perform a physical exam. Further testing may be done for severe constipation. Some tests may include:  A barium enema X-ray to examine your rectum, colon, and, sometimes, your small intestine.   A sigmoidoscopy to examine your lower colon.   A colonoscopy to examine your entire colon. TREATMENT  Treatment will depend on the severity of your constipation and what is causing it. Some dietary treatments include drinking more fluids and eating more fiber-rich foods. Lifestyle treatments may include regular exercise. If these diet and lifestyle recommendations do not help, your health care provider may recommend taking over-the-counter laxative  medicines to help you have bowel movements. Prescription medicines may be prescribed if over-the-counter medicines do not work.  HOME CARE INSTRUCTIONS   Eat foods that have a lot of fiber, such as fruits, vegetables, whole grains, and beans.  Limit foods high in fat and processed sugars, such as french fries, hamburgers, cookies, candies, and soda.   A fiber supplement may be added to your diet if you cannot get enough fiber from foods.   Drink enough fluids to keep your urine clear or pale yellow.   Exercise regularly or as directed by your health care provider.   Go to the restroom when you have the urge to go. Do not hold it.   Only take over-the-counter or prescription medicines as directed by your health care provider. Do not take other medicines for constipation without talking to your health care provider first.  [place] IF:   You have bright red blood in your stool.   Your constipation lasts for more than 4 days or gets worse.   You have abdominal or rectal pain.   You have thin, pencil-like stools.   You have unexplained weight loss. MAKE SURE YOU:   Understand these instructions.  Will watch your condition.  Will get help right away if you are not doing well or get worse.   This information is not intended to replace advice given to you by your health care provider. Make sure you discuss any questions you have with your health care provider.   Document Released: 11/21/2003 Document Revised: 03/15/2014 Document Reviewed: 12/04/2012 Elsevier Interactive Patient Education 2016 Elsevier  Inc.

## 2015-12-07 LAB — URINE CULTURE

## 2015-12-08 DIAGNOSIS — I5033 Acute on chronic diastolic (congestive) heart failure: Secondary | ICD-10-CM | POA: Diagnosis not present

## 2015-12-08 DIAGNOSIS — L89522 Pressure ulcer of left ankle, stage 2: Secondary | ICD-10-CM | POA: Diagnosis not present

## 2015-12-08 DIAGNOSIS — G20 Parkinson's disease: Secondary | ICD-10-CM | POA: Diagnosis not present

## 2015-12-08 DIAGNOSIS — M6281 Muscle weakness (generalized): Secondary | ICD-10-CM | POA: Diagnosis not present

## 2015-12-08 DIAGNOSIS — R2681 Unsteadiness on feet: Secondary | ICD-10-CM | POA: Diagnosis not present

## 2015-12-10 ENCOUNTER — Telehealth: Payer: Self-pay | Admitting: *Deleted

## 2015-12-10 NOTE — Telephone Encounter (Signed)
Patient daughter, [name] called and stated that her parents are going to move into [facility] and need FL2 forms filled out. Will drop those off for Dr. Mariea Clonts to review and sign.

## 2015-12-11 ENCOUNTER — Telehealth: Payer: Self-pay | Admitting: Internal Medicine

## 2015-12-11 ENCOUNTER — Telehealth: Payer: Self-pay | Admitting: *Deleted

## 2015-12-11 DIAGNOSIS — M6281 Muscle weakness (generalized): Secondary | ICD-10-CM | POA: Diagnosis not present

## 2015-12-11 DIAGNOSIS — G20 Parkinson's disease: Secondary | ICD-10-CM | POA: Diagnosis not present

## 2015-12-11 DIAGNOSIS — I5033 Acute on chronic diastolic (congestive) heart failure: Secondary | ICD-10-CM | POA: Diagnosis not present

## 2015-12-11 DIAGNOSIS — R2681 Unsteadiness on feet: Secondary | ICD-10-CM | POA: Diagnosis not present

## 2015-12-11 DIAGNOSIS — L89522 Pressure ulcer of left ankle, stage 2: Secondary | ICD-10-CM | POA: Diagnosis not present

## 2015-12-11 NOTE — Telephone Encounter (Signed)
Forms received for [facility]. Dropped off by Daughter, Annetta Maw [phone] or [phone] required to move to facility. Given to Dr. Mariea Clonts to review and sign.

## 2015-12-12 ENCOUNTER — Ambulatory Visit: Payer: Medicare Other | Admitting: Neurology

## 2015-12-12 ENCOUNTER — Telehealth: Payer: Self-pay

## 2015-12-12 DIAGNOSIS — G20 Parkinson's disease: Secondary | ICD-10-CM | POA: Diagnosis not present

## 2015-12-12 DIAGNOSIS — M6281 Muscle weakness (generalized): Secondary | ICD-10-CM | POA: Diagnosis not present

## 2015-12-12 DIAGNOSIS — I5033 Acute on chronic diastolic (congestive) heart failure: Secondary | ICD-10-CM | POA: Diagnosis not present

## 2015-12-12 DIAGNOSIS — L89522 Pressure ulcer of left ankle, stage 2: Secondary | ICD-10-CM | POA: Diagnosis not present

## 2015-12-12 DIAGNOSIS — R2681 Unsteadiness on feet: Secondary | ICD-10-CM | POA: Diagnosis not present

## 2015-12-12 MED ORDER — DOXYCYCLINE HYCLATE 100 MG PO TABS: 100.0000 mg | ORAL_TABLET | Freq: Two times a day (BID) | ORAL | 0 refills | Status: DC

## 2015-12-12 NOTE — Telephone Encounter (Signed)
Patient's daughter called requesting results of Urine Culture from 8 days ago.  I reviewed labs and report was not finalized or addressed by provider. I had to contact [employer] for a finalized report. Report received (never crossed over into EPIC) and addressed via phone by Dr.Reed.  Per Dr.Reed Doxycycline 100 mg twice daily, avoid sun exsposure, and eat yogert daily while on antibiotic.   RX sent to pharmacy, patient's daughter informed.

## 2015-12-15 ENCOUNTER — Telehealth: Payer: Self-pay | Admitting: *Deleted

## 2015-12-15 DIAGNOSIS — Z029 Encounter for administrative examinations, unspecified: Secondary | ICD-10-CM

## 2015-12-15 NOTE — Telephone Encounter (Signed)
Where is the finalized urine culture? Sounds like she may be having hives so she should stop the doxycycline

## 2015-12-15 NOTE — Telephone Encounter (Signed)
Victoria Lindsey is checking on finalized report.

## 2015-12-15 NOTE — Telephone Encounter (Signed)
Victoria Lindsey received report and patient to stop the Doxycyline and Victoria Lindsey stated that 3 days worth should have helped with infection, to have caregiver watch for any signs or symptoms because she doesn't want to switch it up due to concerns with C-Diff history.   [name] notified and agreed. Will discuss repeat U/A with Dr. Mariea Clonts 11/13 . Will call if showing any signs or symptoms. Allergy added to allergy list.

## 2015-12-15 NOTE — Telephone Encounter (Signed)
[name], Caregiver called and stated that patient was given Doxycycline on Friday for UTI and now she has Red Itchy Splotches on her skin, all over in different places. No difficulty breathing. Please Advise.

## 2015-12-19 ENCOUNTER — Ambulatory Visit: Payer: Medicare Other | Admitting: Neurology

## 2015-12-19 DIAGNOSIS — R2681 Unsteadiness on feet: Secondary | ICD-10-CM | POA: Diagnosis not present

## 2015-12-19 DIAGNOSIS — G20 Parkinson's disease: Secondary | ICD-10-CM | POA: Diagnosis not present

## 2015-12-19 DIAGNOSIS — I5033 Acute on chronic diastolic (congestive) heart failure: Secondary | ICD-10-CM | POA: Diagnosis not present

## 2015-12-19 DIAGNOSIS — M6281 Muscle weakness (generalized): Secondary | ICD-10-CM | POA: Diagnosis not present

## 2015-12-19 DIAGNOSIS — L89522 Pressure ulcer of left ankle, stage 2: Secondary | ICD-10-CM | POA: Diagnosis not present

## 2015-12-23 DIAGNOSIS — R2681 Unsteadiness on feet: Secondary | ICD-10-CM | POA: Diagnosis not present

## 2015-12-23 DIAGNOSIS — I5033 Acute on chronic diastolic (congestive) heart failure: Secondary | ICD-10-CM | POA: Diagnosis not present

## 2015-12-23 DIAGNOSIS — M6281 Muscle weakness (generalized): Secondary | ICD-10-CM | POA: Diagnosis not present

## 2015-12-23 DIAGNOSIS — L89522 Pressure ulcer of left ankle, stage 2: Secondary | ICD-10-CM | POA: Diagnosis not present

## 2015-12-23 DIAGNOSIS — G20 Parkinson's disease: Secondary | ICD-10-CM | POA: Diagnosis not present

## 2015-12-24 DIAGNOSIS — R2681 Unsteadiness on feet: Secondary | ICD-10-CM | POA: Diagnosis not present

## 2015-12-24 DIAGNOSIS — G20 Parkinson's disease: Secondary | ICD-10-CM | POA: Diagnosis not present

## 2015-12-24 DIAGNOSIS — I5033 Acute on chronic diastolic (congestive) heart failure: Secondary | ICD-10-CM | POA: Diagnosis not present

## 2015-12-24 DIAGNOSIS — M6281 Muscle weakness (generalized): Secondary | ICD-10-CM | POA: Diagnosis not present

## 2015-12-24 DIAGNOSIS — L89522 Pressure ulcer of left ankle, stage 2: Secondary | ICD-10-CM | POA: Diagnosis not present

## 2015-12-26 ENCOUNTER — Telehealth: Payer: Self-pay | Admitting: *Deleted

## 2015-12-26 DIAGNOSIS — L89522 Pressure ulcer of left ankle, stage 2: Secondary | ICD-10-CM | POA: Diagnosis not present

## 2015-12-26 DIAGNOSIS — R2681 Unsteadiness on feet: Secondary | ICD-10-CM | POA: Diagnosis not present

## 2015-12-26 DIAGNOSIS — M6281 Muscle weakness (generalized): Secondary | ICD-10-CM | POA: Diagnosis not present

## 2015-12-26 DIAGNOSIS — G20 Parkinson's disease: Secondary | ICD-10-CM | POA: Diagnosis not present

## 2015-12-26 DIAGNOSIS — I5033 Acute on chronic diastolic (congestive) heart failure: Secondary | ICD-10-CM | POA: Diagnosis not present

## 2015-12-26 NOTE — Telephone Encounter (Signed)
Daughter, [name] called and stated that patient has been complaining about Pain/Ache in her Neck. Patient has fallen a couple of times and states when she gets up she sees color. They take her BP and its low, this morning it was 113/59. Daughter is wondering if is isn't Orthostatic Hypertension.  Please Advise.

## 2015-12-29 ENCOUNTER — Ambulatory Visit (INDEPENDENT_AMBULATORY_CARE_PROVIDER_SITE_OTHER): Payer: Medicare Other | Admitting: Internal Medicine

## 2015-12-29 ENCOUNTER — Encounter: Payer: Self-pay | Admitting: Internal Medicine

## 2015-12-29 VITALS — BP 120/60 | HR 74 | Temp 97.5°F | Wt 114.0 lb

## 2015-12-29 DIAGNOSIS — I951 Orthostatic hypotension: Secondary | ICD-10-CM | POA: Diagnosis not present

## 2015-12-29 DIAGNOSIS — G20 Parkinson's disease: Secondary | ICD-10-CM | POA: Diagnosis not present

## 2015-12-29 DIAGNOSIS — G20A1 Parkinson's disease without dyskinesia, without mention of fluctuations: Secondary | ICD-10-CM

## 2015-12-29 DIAGNOSIS — R296 Repeated falls: Secondary | ICD-10-CM | POA: Diagnosis not present

## 2015-12-29 MED ORDER — METOPROLOL TARTRATE 75 MG PO TABS: 75.0000 mg | ORAL_TABLET | Freq: Two times a day (BID) | ORAL | 3 refills | Status: DC

## 2015-12-29 NOTE — Telephone Encounter (Signed)
[name] notified and appointment was made for 2:30 today.

## 2015-12-29 NOTE — Progress Notes (Signed)
Location:  [facility] clinic Provider: Ashling Roane L. Mariea Clonts, D.O., C.M.D.  Code Status: DNR Goals of Care:  Advanced Directives 12/04/2015  Does patient have an advance directive? Yes  Type of Paramedic of [place];Living will  Does patient want to make changes to advanced directive? -  Copy of advanced directive(s) in chart? Yes  Would patient like information on creating an advanced directive? -  Pre-existing out of facility DNR order (yellow form or pink MOST form) -     Chief Complaint  Patient presents with  . Acute Visit    LOW BLOOD PRESSURE    HPI: Patient is a 82 y.o. female  With h/o sjogren's disease, Parkinson's with some prior psychosis seen today for an acute visit for low pressure.  Her daughter is suspicious that she is having orthostatic hypotension causing these symptoms.  One fall and another episode where she slid down the edge of the bed and hurt her back.  First time, she saw blue and white puzzle pieces that were moving.  The second time, she thinks she it the door with her back.  She saw red flowers that were stringy.  She saw these things as she started falling--both episodes were when she started to stand.  No headache.  No loss of vision at the episodes.  She does have a lot of ulcerations on her corneas from her dry eyes.  She's now on bid restasis.  She has historically seen some objects as other objects.  She knows she has depth perception problems.  New glasses for that didn't help.  She admits to dizziness when getting out of bed--typically does not get up until that goes away.    Past Medical History:  Diagnosis Date  . Anemia   . Breast cancer ([place])    LEFT BREAST  . Dizziness   . Hyperglycemia   . Hyperlipidemia   . Hypertension   . Hypophonia   . Hypothyroidism   . Kidney failure    stage 3  . Left ventricular diastolic dysfunction   . Orthostatic hypotension   . Parkinson's disease   . Prediabetes   . TIA (transient  ischemic attack)   . Tremor     Past Surgical History:  Procedure Laterality Date  . BREAST LUMPECTOMY  2010  . CARDIOVASCULAR STRESS TEST  06/2007   NORMAL  . CATARACT EXTRACTION  2005,2006   Dr.Devonzo  . CHOLECYSTECTOMY  1994  . NASAL SINUS SURGERY     submucous resection late 1950s  . TONSILLECTOMY      Allergies  Allergen Reactions  . Aciphex [Rabeprazole Sodium]     unknown  . Amantadine Other (See Comments)    Pt cannot remember  . Amantadine Hcl     unknown  . Amantadines     unknown  . Avapro [Irbesartan]     unknown  . Cardura [Doxazosin Mesylate]     unknown  . Ciprofloxacin     unknown  . Clonidine Derivatives     unknown  . Cozaar     nightmares  . Diltiazem     unknown  . Doxycycline Hives  . Famotidine     unknown  . Hctz [Hydrochlorothiazide]     unknown  . Indapamide     unknown  . Lansoprazole     unknown  . Lisinopril     Mouth problems  . Loratadine     unknown  . Maxidex [Dexamethasone]     unknown  .  Mirapex [Pramipexole Dihydrochloride]     unknown  . Norvasc [Amlodipine Besylate]     unknown  . Pantoprazole Sodium     unknown  . Prilosec [Omeprazole]     unknown  . Procardia [Nifedipine]     unknown  . Proton Pump Inhibitors     unknown  . Sulfa Drugs Cross Reactors     unknown  . Trimethoprim     unknown  . Zyrtec [Cetirizine Hcl]     unknown  . Metronidazole Rash  . Penicillins Rash    Has patient had a PCN reaction causing immediate rash, facial/tongue/throat swelling, SOB or lightheadedness with hypotension: just a rash all over Has patient had a PCN reaction causing severe rash involving mucus membranes or skin necrosis: no Has patient had a PCN reaction that required hospitalization-no Has patient had a PCN reaction occurring within the last 10 years: no, more than 50 yrs ago If all of the above answers are "NO", then may proceed with Cephalosporin use.       Medication List       Accurate as of  12/29/15  2:53 PM. Always use your most recent med list.          aspirin 81 MG tablet Take 81 mg by mouth every evening.   AZILECT 1 MG Tabs tablet Generic drug:  rasagiline Take 1 mg by mouth daily.   BIOTIN PO Take 1,000 mcg by mouth daily.   carbidopa-levodopa 25-100 MG tablet Commonly known as:  SINEMET IR Take 1 tablet by mouth 3 (three) times daily.   clopidogrel 75 MG tablet Commonly known as:  PLAVIX Take 1 tablet (75 mg total) by mouth every evening.   Cranberry 500 MG Caps Take 500 mg by mouth daily.   cycloSPORINE 0.05 % ophthalmic emulsion Commonly known as:  RESTASIS Place 1 drop into both eyes 2 (two) times daily.   docusate sodium 100 MG capsule Commonly known as:  COLACE Take 100 mg by mouth 2 (two) times daily.   folic acid Q000111Q MCG tablet Commonly known as:  FOLVITE Take 800 mcg by mouth daily.   furosemide 40 MG tablet Commonly known as:  LASIX Take 1 tablet (40 mg total) by mouth daily.   gabapentin 100 MG capsule Commonly known as:  NEURONTIN Take 100 mg by mouth at bedtime. One additional PRN   Melatonin 3 MG Tbdp Take 1 tablet by mouth daily.   metoprolol 100 MG tablet Commonly known as:  LOPRESSOR Take 1 tablet (100 mg total) by mouth 2 (two) times daily.   multivitamin with minerals Tabs tablet Take 1 tablet by mouth daily.   POLY-IRON 150 150 MG capsule Generic drug:  iron polysaccharides Take ONE (1) capsule by mouth daily   spironolactone 25 MG tablet Commonly known as:  ALDACTONE Take 1 tablet (25 mg total) by mouth daily.   SYNTHROID 175 MCG tablet Generic drug:  levothyroxine Take ONE (1) tablet by mouth daily before breakfast   SYSTANE OP Apply 1 drop to eye 2 (two) times daily as needed (dryness).   Vitamin D3 2000 units Tabs Take 1 tablet by mouth daily.       Review of Systems:  Review of Systems  Constitutional: Negative for chills and fever.  Eyes: Positive for blurred vision.       Dry eyes    Respiratory: Negative for shortness of breath.   Cardiovascular: Negative for chest pain and palpitations.  Gastrointestinal: Negative for abdominal pain, blood in  stool, constipation and melena.  Genitourinary: Negative for dysuria.  Musculoskeletal: Positive for back pain, falls and myalgias.  Skin: Negative for itching and rash.       multiple ecchymoses from falls  Neurological: Positive for dizziness. Negative for loss of consciousness and headaches.       Parkinson's, uses rollator walker  Psychiatric/Behavioral: Positive for hallucinations and memory loss. Negative for depression and suicidal ideas.    Health Maintenance  Topic Date Due  . FOOT EXAM  10/08/2015  . URINE MICROALBUMIN  10/11/2015  . PNA vac Low Risk Adult (2 of 2 - PCV13) 10/06/2023 (Originally 05/24/2014)  . MAMMOGRAM  03/08/2048 (Originally 03/08/2012)  . OPHTHALMOLOGY EXAM  04/06/2016  . HEMOGLOBIN A1C  04/22/2016  . TETANUS/TDAP  02/08/2022  . INFLUENZA VACCINE  Completed  . DEXA SCAN  Completed  . ZOSTAVAX  Completed    Physical Exam: Vitals:   12/29/15 1435  BP: 140/70  Pulse: 67  Temp: 97.5 F (36.4 C)  TempSrc: Oral  SpO2: 95%  Weight: 114 lb (51.7 kg)   Body mass index is 22.26 kg/m. Physical Exam  Constitutional: She is oriented to person, place, and time. She appears well-developed and well-nourished. No distress.  HENT:  Head: Normocephalic and atraumatic.  Eyes: Conjunctivae and EOM are normal. Pupils are equal, round, and reactive to light.  Not wearing glasses  Neck: Neck supple.  Cardiovascular: Normal rate, regular rhythm, normal heart sounds and intact distal pulses.   Pulmonary/Chest: Effort normal and breath sounds normal. No respiratory distress.  Abdominal: Soft. Bowel sounds are normal. She exhibits no distension.  Musculoskeletal: Normal range of motion.  Neurological: She is alert and oriented to person, place, and time. She exhibits abnormal muscle tone.  Walks with  rollator, needs assistance to get on exam table  Skin: Skin is warm and dry.  Multiple ecchymoses on arms and back from falls    Labs reviewed: Basic Metabolic Panel:  Recent Labs  04/21/15 0929 06/06/15 0936 06/20/15 1421 10/21/15 0909 10/24/15 1215  NA 139  --  139 141 140  K 4.8  --  4.6 4.4 4.2  CL 96  --  99* 101 102  CO2 25  --  28 29 25   GLUCOSE 98  --  134* 114* 99  BUN 36*  --  35* 44* 45*  CREATININE 1.10*  --  1.04* 1.37* 1.33*  CALCIUM 9.3  --  9.8 9.1 8.6  TSH 9.860* 5.620*  --  2.78  --    Liver Function Tests:  Recent Labs  01/25/15 1151 04/21/15 0929 10/21/15 0909  AST 75* 44* 50*  ALT 78* 36* 30*  ALKPHOS 176* 166* 150*  BILITOT 0.4 0.4 0.6  PROT 8.4* 7.1 6.9  ALBUMIN 4.2 3.9 3.8    Recent Labs  01/25/15 1151  LIPASE 33   No results for input(s): AMMONIA in the last 8760 hours. CBC:  Recent Labs  03/17/15 04/21/15 0929 06/20/15 1421 07/11/15 1000 10/21/15 0909  WBC 6.4 6.7 6.0 6.1 7.4  NEUTROABS  --  3.7  --  3.4 4,292  HGB 11.5*  --  11.5*  --  12.2  HCT 36 36.9 35.9* 35.4 37.8  MCV  --  96 100.3* 97 99.5  PLT 127* 148* 125* 132* 153   Lipid Panel:  Recent Labs  01/08/15 0806 10/21/15 0909  CHOL 112 200  HDL 39* 40*  LDLCALC 55 125  TRIG 89 175*  CHOLHDL 2.9 5.0   Lab  Results  Component Value Date   HGBA1C 6.2 (H) 10/21/2015    Assessment/Plan 1. Orthostatic hypotension - encouraged po fluids - decrease metoprolol from 100mg  po bid to 75mg  po bid -metoprolol 75 MG TABS; Take 75 mg by mouth 2 (two) times daily.  Dispense: 60 tablet; Refill: 3 -asked for [facility] PT to check orthostatics and send me results after bp med changes so we can assess if this is enough b/c she also does not hydrate well and on two diuretics  2. Parkinson's disease ([place]) -symptoms are odd--I've never heard of orthostatic hypotension causing visual images like she's had when she's lightheaded on standing, but her bp did drop 20 pt systolic here in  the office from sitting to standing and she was dizzy--did not have the unusual auras when here -? Related to her PD and actually hallucinations  -has had neck aches off and on for a month, but not correlated at all with these spells  3. Falls frequently -2 recent falls and she has several new bruises from these--seem orthostatic related -cont use of rollator walker at all times  Moving to [facility] this weekend with her husband  Labs/tests ordered: No orders of the defined types were placed in this encounter.  Next appt:  01/19/2016--keep  Tejay Hubert L. Keasia Dubose, D.O. [facility] [address], [place] [address] Cell Phone (Mon-Fri 8am-5pm):  [phone] On Call:  [phone] & follow prompts after 5pm & weekends Office Phone:  [phone] Office Fax:  [phone]

## 2015-12-29 NOTE — Patient Instructions (Signed)
Let's decrease the metoprolol to 75mg  twice a day from 100mg  twice a day.   I'd like for PT to check orthostatic BP and HR lying, sitting, standing, please and send me the results at [phone].  I will review.  We can then decide whether diuretics also should be reduced. PLEASE drink more water.

## 2015-12-29 NOTE — Telephone Encounter (Signed)
Possibly, but that reading isn't that low.  Let's have her come in to be seen about this so we can do orthostatics in the office.

## 2015-12-31 DIAGNOSIS — R2681 Unsteadiness on feet: Secondary | ICD-10-CM | POA: Diagnosis not present

## 2015-12-31 DIAGNOSIS — G20 Parkinson's disease: Secondary | ICD-10-CM | POA: Diagnosis not present

## 2015-12-31 DIAGNOSIS — I5033 Acute on chronic diastolic (congestive) heart failure: Secondary | ICD-10-CM | POA: Diagnosis not present

## 2015-12-31 DIAGNOSIS — L89522 Pressure ulcer of left ankle, stage 2: Secondary | ICD-10-CM | POA: Diagnosis not present

## 2015-12-31 DIAGNOSIS — M6281 Muscle weakness (generalized): Secondary | ICD-10-CM | POA: Diagnosis not present

## 2016-01-02 DIAGNOSIS — R2681 Unsteadiness on feet: Secondary | ICD-10-CM | POA: Diagnosis not present

## 2016-01-02 DIAGNOSIS — L89522 Pressure ulcer of left ankle, stage 2: Secondary | ICD-10-CM | POA: Diagnosis not present

## 2016-01-02 DIAGNOSIS — M6281 Muscle weakness (generalized): Secondary | ICD-10-CM | POA: Diagnosis not present

## 2016-01-02 DIAGNOSIS — G20 Parkinson's disease: Secondary | ICD-10-CM | POA: Diagnosis not present

## 2016-01-02 DIAGNOSIS — I5033 Acute on chronic diastolic (congestive) heart failure: Secondary | ICD-10-CM | POA: Diagnosis not present

## 2016-01-06 DIAGNOSIS — L89522 Pressure ulcer of left ankle, stage 2: Secondary | ICD-10-CM | POA: Diagnosis not present

## 2016-01-06 DIAGNOSIS — G20 Parkinson's disease: Secondary | ICD-10-CM | POA: Diagnosis not present

## 2016-01-06 DIAGNOSIS — M6281 Muscle weakness (generalized): Secondary | ICD-10-CM | POA: Diagnosis not present

## 2016-01-06 DIAGNOSIS — R2681 Unsteadiness on feet: Secondary | ICD-10-CM | POA: Diagnosis not present

## 2016-01-06 DIAGNOSIS — I5033 Acute on chronic diastolic (congestive) heart failure: Secondary | ICD-10-CM | POA: Diagnosis not present

## 2016-01-08 DIAGNOSIS — I5033 Acute on chronic diastolic (congestive) heart failure: Secondary | ICD-10-CM | POA: Diagnosis not present

## 2016-01-08 DIAGNOSIS — M6281 Muscle weakness (generalized): Secondary | ICD-10-CM | POA: Diagnosis not present

## 2016-01-08 DIAGNOSIS — L89522 Pressure ulcer of left ankle, stage 2: Secondary | ICD-10-CM | POA: Diagnosis not present

## 2016-01-08 DIAGNOSIS — R2681 Unsteadiness on feet: Secondary | ICD-10-CM | POA: Diagnosis not present

## 2016-01-08 DIAGNOSIS — G20 Parkinson's disease: Secondary | ICD-10-CM | POA: Diagnosis not present

## 2016-01-09 DIAGNOSIS — L89522 Pressure ulcer of left ankle, stage 2: Secondary | ICD-10-CM | POA: Diagnosis not present

## 2016-01-09 DIAGNOSIS — I5033 Acute on chronic diastolic (congestive) heart failure: Secondary | ICD-10-CM | POA: Diagnosis not present

## 2016-01-09 DIAGNOSIS — R2681 Unsteadiness on feet: Secondary | ICD-10-CM | POA: Diagnosis not present

## 2016-01-09 DIAGNOSIS — M6281 Muscle weakness (generalized): Secondary | ICD-10-CM | POA: Diagnosis not present

## 2016-01-09 DIAGNOSIS — G20 Parkinson's disease: Secondary | ICD-10-CM | POA: Diagnosis not present

## 2016-01-12 ENCOUNTER — Other Ambulatory Visit: Payer: Self-pay | Admitting: *Deleted

## 2016-01-12 MED ORDER — LEVOTHYROXINE SODIUM 175 MCG PO TABS: ORAL_TABLET | ORAL | 3 refills | Status: AC

## 2016-01-12 NOTE — Telephone Encounter (Signed)
[place]

## 2016-01-13 DIAGNOSIS — I5033 Acute on chronic diastolic (congestive) heart failure: Secondary | ICD-10-CM | POA: Diagnosis not present

## 2016-01-13 DIAGNOSIS — L89522 Pressure ulcer of left ankle, stage 2: Secondary | ICD-10-CM | POA: Diagnosis not present

## 2016-01-13 DIAGNOSIS — G20 Parkinson's disease: Secondary | ICD-10-CM | POA: Diagnosis not present

## 2016-01-13 DIAGNOSIS — R2681 Unsteadiness on feet: Secondary | ICD-10-CM | POA: Diagnosis not present

## 2016-01-13 DIAGNOSIS — M6281 Muscle weakness (generalized): Secondary | ICD-10-CM | POA: Diagnosis not present

## 2016-01-15 ENCOUNTER — Encounter: Payer: Self-pay | Admitting: Podiatry

## 2016-01-15 ENCOUNTER — Ambulatory Visit (INDEPENDENT_AMBULATORY_CARE_PROVIDER_SITE_OTHER): Payer: Medicare Other | Admitting: Podiatry

## 2016-01-15 VITALS — BP 146/68 | HR 71

## 2016-01-15 DIAGNOSIS — L89522 Pressure ulcer of left ankle, stage 2: Secondary | ICD-10-CM | POA: Diagnosis not present

## 2016-01-15 DIAGNOSIS — M79672 Pain in left foot: Secondary | ICD-10-CM | POA: Diagnosis not present

## 2016-01-15 DIAGNOSIS — L97521 Non-pressure chronic ulcer of other part of left foot limited to breakdown of skin: Secondary | ICD-10-CM

## 2016-01-15 DIAGNOSIS — M7750 Other enthesopathy of unspecified foot: Secondary | ICD-10-CM | POA: Diagnosis not present

## 2016-01-15 DIAGNOSIS — M6281 Muscle weakness (generalized): Secondary | ICD-10-CM | POA: Diagnosis not present

## 2016-01-15 DIAGNOSIS — M79671 Pain in right foot: Secondary | ICD-10-CM

## 2016-01-15 DIAGNOSIS — B351 Tinea unguium: Secondary | ICD-10-CM

## 2016-01-15 DIAGNOSIS — R2681 Unsteadiness on feet: Secondary | ICD-10-CM | POA: Diagnosis not present

## 2016-01-15 DIAGNOSIS — G20 Parkinson's disease: Secondary | ICD-10-CM | POA: Diagnosis not present

## 2016-01-15 DIAGNOSIS — I5033 Acute on chronic diastolic (congestive) heart failure: Secondary | ICD-10-CM | POA: Diagnosis not present

## 2016-01-15 NOTE — Patient Instructions (Signed)
Seen for hypertrophic nails and painful toes. All nails and ulcerating corns on 5th toe bilateral debrided. Keep cotton balls in between 4th and 5th digits on both feet. Return in 3 months or sooner if pain returns.

## 2016-01-15 NOTE — Progress Notes (Signed)
SUBJECTIVE: 82 y.o. year old female presents aided by walker and accompanied by her husband and her daughter for pain on 5th toe on both feet and problematic toe nails. Her feet were hurting to walk. She has Parkinson's and having problem getting her nails trimmed.   OBJECTIVE: DERMATOLOGIC EXAMINATION: Hypertrophic nails x 10. Thin shiny skin both feet. Porokeratotic lesion distal medial 5th toe bilateral, pre ulcerative. VASCULAR EXAMINATION OF LOWER LIMBS: Left foot pedal pulses are palpable with normal pulsation. Right foot is not palpable. Purple and blue toes lesser digits. Normal color returns after movement. NEUROLOGIC EXAMINATION OF THE LOWER LIMBS: All epicritic and tactile sensations grossly intact.  MUSCULOSKELETAL EXAMINATION: Bone spur distal medial 5th toe bilateral. Thin planta fat pad bilateral.   ASSESSMENT: Onychomycosis x 10. Spurring at distal medial 5th toe with pre ulcerative corn bilateral. Pain in lower limbs.  PLAN: All nails debrided. 5th toe digital lesions on both feet debrided and gauze pad placed. Pain was relieved. Instructed to keep the 4th and 5th digits apart using Cotton balls. Return in 3 months or as needed

## 2016-01-15 NOTE — Telephone Encounter (Signed)
Opened in error

## 2016-01-16 DIAGNOSIS — G20 Parkinson's disease: Secondary | ICD-10-CM | POA: Diagnosis not present

## 2016-01-16 DIAGNOSIS — L89522 Pressure ulcer of left ankle, stage 2: Secondary | ICD-10-CM | POA: Diagnosis not present

## 2016-01-16 DIAGNOSIS — R2681 Unsteadiness on feet: Secondary | ICD-10-CM | POA: Diagnosis not present

## 2016-01-16 DIAGNOSIS — I5033 Acute on chronic diastolic (congestive) heart failure: Secondary | ICD-10-CM | POA: Diagnosis not present

## 2016-01-16 DIAGNOSIS — M6281 Muscle weakness (generalized): Secondary | ICD-10-CM | POA: Diagnosis not present

## 2016-01-19 ENCOUNTER — Ambulatory Visit (INDEPENDENT_AMBULATORY_CARE_PROVIDER_SITE_OTHER): Payer: Medicare Other | Admitting: Internal Medicine

## 2016-01-19 ENCOUNTER — Ambulatory Visit (INDEPENDENT_AMBULATORY_CARE_PROVIDER_SITE_OTHER): Payer: Medicare Other

## 2016-01-19 ENCOUNTER — Encounter: Payer: Self-pay | Admitting: Internal Medicine

## 2016-01-19 VITALS — BP 148/74 | HR 79 | Temp 97.5°F | Ht 60.0 in | Wt 113.0 lb

## 2016-01-19 DIAGNOSIS — Z0000 Encounter for general adult medical examination without abnormal findings: Secondary | ICD-10-CM

## 2016-01-19 DIAGNOSIS — H903 Sensorineural hearing loss, bilateral: Secondary | ICD-10-CM | POA: Diagnosis not present

## 2016-01-19 DIAGNOSIS — G20 Parkinson's disease: Secondary | ICD-10-CM

## 2016-01-19 DIAGNOSIS — G20A1 Parkinson's disease without dyskinesia, without mention of fluctuations: Secondary | ICD-10-CM

## 2016-01-19 DIAGNOSIS — C50919 Malignant neoplasm of unspecified site of unspecified female breast: Secondary | ICD-10-CM | POA: Diagnosis not present

## 2016-01-19 DIAGNOSIS — R296 Repeated falls: Secondary | ICD-10-CM | POA: Diagnosis not present

## 2016-01-19 DIAGNOSIS — F321 Major depressive disorder, single episode, moderate: Secondary | ICD-10-CM | POA: Diagnosis not present

## 2016-01-19 DIAGNOSIS — Z9889 Other specified postprocedural states: Secondary | ICD-10-CM | POA: Diagnosis not present

## 2016-01-19 DIAGNOSIS — D649 Anemia, unspecified: Secondary | ICD-10-CM | POA: Diagnosis not present

## 2016-01-19 DIAGNOSIS — R5381 Other malaise: Secondary | ICD-10-CM | POA: Diagnosis not present

## 2016-01-19 DIAGNOSIS — D696 Thrombocytopenia, unspecified: Secondary | ICD-10-CM | POA: Diagnosis not present

## 2016-01-19 DIAGNOSIS — Z923 Personal history of irradiation: Secondary | ICD-10-CM | POA: Diagnosis not present

## 2016-01-19 NOTE — Patient Instructions (Signed)
Victoria Lindsey , Thank you for taking time to come for your Medicare Wellness Visit. I appreciate your ongoing commitment to your health goals. Please review the following plan we discussed and let me know if I can assist you in the future.   These are the goals we discussed: Goals    None      This is a list of the screening recommended for you and due dates:  Health Maintenance  Topic Date Due  . Complete foot exam   10/08/2015  . Urine Protein Check  10/11/2015  . Pneumonia vaccines (2 of 2 - PCV13) 10/06/2023*  . Mammogram  03/08/2048*  . Eye exam for diabetics  04/06/2016  . Hemoglobin A1C  04/22/2016  . Tetanus Vaccine  02/08/2022  . Flu Shot  Completed  . DEXA scan (bone density measurement)  Completed  . Shingles Vaccine  Completed  *Topic was postponed. The date shown is not the original due date.  Preventive Care for Adults  A healthy lifestyle and preventive care can promote health and wellness. Preventive health guidelines for adults include the following key practices.  . A routine yearly physical is a good way to check with your health care provider about your health and preventive screening. It is a chance to share any concerns and updates on your health and to receive a thorough exam.  . Visit your dentist for a routine exam and preventive care every 6 months. Brush your teeth twice a day and floss once a day. Good oral hygiene prevents tooth decay and gum disease.  . The frequency of eye exams is based on your age, health, family medical history, use  of contact lenses, and other factors. Follow your health care provider's ecommendations for frequency of eye exams.  . Eat a healthy diet. Foods like vegetables, fruits, whole grains, low-fat dairy products, and lean protein foods contain the nutrients you need without too many calories. Decrease your intake of foods high in solid fats, added sugars, and salt. Eat the right amount of calories for you. Get information  about a proper diet from your health care provider, if necessary.  . Regular physical exercise is one of the most important things you can do for your health. Most adults should get at least 150 minutes of moderate-intensity exercise (any activity that increases your heart rate and causes you to sweat) each week. In addition, most adults need muscle-strengthening exercises on 2 or more days a week.  Silver Sneakers may be a benefit available to you. To determine eligibility, you may visit the website: www.silversneakers.com or contact program at 551-650-4347 Mon-Fri between 8AM-8PM.   . Maintain a healthy weight. The body mass index (BMI) is a screening tool to identify possible weight problems. It provides an estimate of body fat based on height and weight. Your health care provider can find your BMI and can help you achieve or maintain a healthy weight.   For adults 20 years and older: ? A BMI below 18.5 is considered underweight. ? A BMI of 18.5 to 24.9 is normal. ? A BMI of 25 to 29.9 is considered overweight. ? A BMI of 30 and above is considered obese.   . Maintain normal blood lipids and cholesterol levels by exercising and minimizing your intake of saturated fat. Eat a balanced diet with plenty of fruit and vegetables. Blood tests for lipids and cholesterol should begin at age 3 and be repeated every 5 years. If your lipid or cholesterol levels are  high, you are over 50, or you are at high risk for heart disease, you may need your cholesterol levels checked more frequently. Ongoing high lipid and cholesterol levels should be treated with medicines if diet and exercise are not working.  . If you smoke, find out from your health care provider how to quit. If you do not use tobacco, please do not start.  . If you choose to drink alcohol, please do not consume more than 2 drinks per day. One drink is considered to be 12 ounces (355 mL) of beer, 5 ounces (148 mL) of wine, or 1.5 ounces (44  mL) of liquor.  . If you are 46-30 years old, ask your health care provider if you should take aspirin to prevent strokes.  . Use sunscreen. Apply sunscreen liberally and repeatedly throughout the day. You should seek shade when your shadow is shorter than you. Protect yourself by wearing long sleeves, pants, a wide-brimmed hat, and sunglasses year round, whenever you are outdoors.  . Once a month, do a whole body skin exam, using a mirror to look at the skin on your back. Tell your health care provider of new moles, moles that have irregular borders, moles that are larger than a pencil eraser, or moles that have changed in shape or color.

## 2016-01-19 NOTE — Progress Notes (Signed)
Quick Notes   Health Maintenance:   Due for Foot exam and urine micro   Abnormal Screen:  Depression of 11; recently moved to a home and having a hard time adjusting  Patient Concerns:   Depression and hearing loss.   Nurse Concerns:   Depression

## 2016-01-19 NOTE — Progress Notes (Signed)
Subjective:   Victoria Lindsey is a 82 y.o. female who presents for an Initial Medicare Annual Wellness Visit.  Review of Systems   Cardiac Risk Factors include: advanced age (>71men, >30 women);hypertension    Objective:    Today's Vitals   01/19/16 1008  BP: (!) 148/74  Pulse: 79  Temp: 97.5 F (36.4 C)  TempSrc: Oral  SpO2: 99%  Weight: 113 lb (51.3 kg)  Height: 5' (1.524 m)  PainSc: 0-No pain   Body mass index is 22.07 kg/m.  Current Medications (verified) Outpatient Encounter Prescriptions as of 01/19/2016  Medication Sig  . aspirin 81 MG tablet Take 81 mg by mouth every evening.   Marland Kitchen BIOTIN PO Take 1,000 mcg by mouth daily.   . carbidopa-levodopa (SINEMET IR) 25-100 MG tablet Take 1 tablet by mouth 3 (three) times daily.  . Cholecalciferol (VITAMIN D3) 2000 UNITS TABS Take 1 tablet by mouth daily.   . clopidogrel (PLAVIX) 75 MG tablet Take 1 tablet (75 mg total) by mouth every evening.  . Cranberry 500 MG CAPS Take 500 mg by mouth daily.   . cycloSPORINE (RESTASIS) 0.05 % ophthalmic emulsion Place 1 drop into both eyes 2 (two) times daily.  Marland Kitchen docusate sodium (COLACE) 100 MG capsule Take 100 mg by mouth 2 (two) times daily.   . folic acid (FOLVITE) Q000111Q MCG tablet Take 800 mcg by mouth daily.   . furosemide (LASIX) 40 MG tablet Take 1 tablet (40 mg total) by mouth daily.  Marland Kitchen gabapentin (NEURONTIN) 100 MG capsule Take 100 mg by mouth at bedtime. One additional PRN  . levothyroxine (SYNTHROID) 175 MCG tablet Take one tablet by mouth once daily 30 minutes before breakfast for thyroid.  . metoprolol 75 MG TABS Take 75 mg by mouth 2 (two) times daily.  . Multiple Vitamin (MULTIVITAMIN WITH MINERALS) TABS tablet Take 1 tablet by mouth daily.  . Omega-3 Fatty Acids (FISH OIL) 1200 MG CAPS Take 1 capsule by mouth 1 day or 1 dose.  Marland Kitchen POLY-IRON 150 150 MG capsule Take ONE (1) capsule by mouth daily  . Polyethyl Glycol-Propyl Glycol (SYSTANE OP) Apply 1 drop to eye 2 (two) times  daily as needed (dryness).  . rasagiline (AZILECT) 1 MG TABS Take 1 mg by mouth daily.  Marland Kitchen spironolactone (ALDACTONE) 25 MG tablet Take 1 tablet (25 mg total) by mouth daily.  . [DISCONTINUED] Melatonin 3 MG TBDP Take 1 tablet by mouth daily.   No facility-administered encounter medications on file as of 01/19/2016.     Allergies (verified) Aciphex [rabeprazole sodium]; Amantadine; Amantadine hcl; Amantadines; Avapro [irbesartan]; Cardura [doxazosin mesylate]; Ciprofloxacin; Clonidine derivatives; Cozaar; Diltiazem; Doxycycline; Famotidine; Hctz [hydrochlorothiazide]; Indapamide; Lansoprazole; Lisinopril; Loratadine; Maxidex [dexamethasone]; Mirapex [pramipexole dihydrochloride]; Norvasc [amlodipine besylate]; Pantoprazole sodium; Prilosec [omeprazole]; Procardia [nifedipine]; Proton pump inhibitors; Sulfa drugs cross reactors; Trimethoprim; Zyrtec [cetirizine hcl]; Metronidazole; and Penicillins   History: Past Medical History:  Diagnosis Date  . Anemia   . Breast cancer ([place])    LEFT BREAST  . Dizziness   . Hyperglycemia   . Hyperlipidemia   . Hypertension   . Hypophonia   . Hypothyroidism   . Kidney failure    stage 3  . Left ventricular diastolic dysfunction   . Orthostatic hypotension   . Parkinson's disease   . Prediabetes   . TIA (transient ischemic attack)   . Tremor    Past Surgical History:  Procedure Laterality Date  . BREAST LUMPECTOMY  2010  . CARDIOVASCULAR STRESS TEST  06/2007   NORMAL  . CATARACT EXTRACTION  2005,2006   Dr.Devonzo  . CHOLECYSTECTOMY  1994  . NASAL SINUS SURGERY     submucous resection late 1950s  . TONSILLECTOMY     Family History  Problem Relation Age of Onset  . Heart disease Mother   . Heart failure Father   . Parkinsonism Father   . Diabetes Father   . COPD Sister   . Cancer Brother   . Osteoporosis Daughter   . Stroke Sister    Social History   Occupational History  . retired     [employer] (but retired for 40+ years)     Social History Main Topics  . Smoking status: Former Smoker    Packs/day: 0.50    Years: 12.00    Types: Cigarettes    Quit date: 03/08/1964  . Smokeless tobacco: Never Used  . Alcohol use No  . Drug use: No  . Sexual activity: No   Tobacco Counseling Counseling given: No   Activities of Daily Living In your present state of health, do you have any difficulty performing the following activities: 01/19/2016 02/13/2015  Hearing? Tempie Donning  Vision? Y N  Difficulty concentrating or making decisions? N Y  Walking or climbing stairs? - Y  Dressing or bathing? Y N  Doing errands, shopping? Tempie Donning  Preparing Food and eating ? Y -  Using the Toilet? Y -  In the past six months, have you accidently leaked urine? Y -  Do you have problems with loss of bowel control? Y -  Managing your Medications? Y -  Managing your Finances? Y -  Housekeeping or managing your Housekeeping? Y -  Some recent data might be hidden    Immunizations and Health Maintenance Immunization History  Administered Date(s) Administered  . Hepatitis B 07/20/2011  . Influenza Split 11/08/2011  . Influenza,inj,Quad PF,36+ Mos 11/25/2014, 10/24/2015  . Influenza-Unspecified 12/12/2013  . Pneumococcal-Unspecified 05/23/2013  . Tdap 02/09/2012  . Zoster 02/09/2012   Health Maintenance Due  Topic Date Due  . FOOT EXAM  10/08/2015  . URINE MICROALBUMIN  10/11/2015    Patient Care Team: [name], DO as PCP - General (Geriatric Medicine) Victoria Running. Baird Cancer, MD as Referring Physician (Hematology and Oncology) [name], MD as Referring Physician (Neurology) [name], MD as Consulting Physician (Rheumatology) [name], MD as Consulting Physician (Cardiology) [name], MD as Referring Physician (Ophthalmology) [name], MD as Consulting Physician (Neurology) [name], MD as Referring Physician (Urology) [name], MD as Consulting Physician (Pulmonary Disease) [name], MD as Consulting Physician (Nephrology)  Indicate any recent Medical Services you may have received from other than [facility] providers in the past year (date may be approximate).    Assessment:   This is a routine wellness examination for Victoria Lindsey.   Hearing/Vision screen Hearing Screening Comments: Unsure of last hearing screen.  Vision Screening Comments: Last eye exam done 12/03/15.  Dietary issues and exercise activities discussed: Current Exercise Habits: Structured exercise class (Pt has in home PT and OT as well.), Time (Minutes): 20, Frequency (Times/Week): 2, Weekly Exercise (Minutes/Week): 40, Intensity: Mild  Goals    . Increase water intake          Starting 01/19/16, I will attempt to increase my water intake to 6 cups per day.       Depression Screen PHQ 2/9 Scores 01/19/2016 10/24/2015 09/02/2015 07/24/2015  PHQ - 2 Score 3 0 0 0  PHQ- 9 Score 11 - - -    Fall Risk Fall Risk  01/19/2016 12/04/2015 11/21/2015 11/03/2015 10/24/2015  Falls in the past year? - Yes Yes Yes No  Number falls in past yr: 2 or more 2 or more 2 or more 2 or more -  Injury with Fall? Yes Yes No No -  Risk Factor Category  High Fall Risk - High Fall Risk - -  Risk for fall due to : History of fall(s);Impaired balance/gait;Impaired mobility - - - -  Follow up Falls prevention discussed - Falls evaluation completed Falls evaluation completed -    Cognitive Function: MMSE - Mini Mental State Exam 01/19/2016  Orientation to time 5  Orientation to Place 4  Registration 3  Attention/ Calculation 4  Recall 2  Language- name 2 objects 2  Language- repeat 1  Language- follow 3 step command 3  Language- read & follow direction 1  Write a sentence 1  Copy design 0  Total score 26   Montreal Cognitive Assessment  11/03/2015  Visuospatial/ Executive (0/5) 4  Naming (0/3) 2  Attention: Read list of digits (0/2) 2  Attention: Read list of letters (0/1) 1  Attention: Serial 7 subtraction  starting at 100 (0/3) 0  Language: Repeat phrase (0/2) 0  Language : Fluency (0/1) 0  Abstraction (0/2) 2  Delayed Recall (0/5) 0  Orientation (0/6) 4  Total 15  Adjusted Score (based on education) 15      Screening Tests Health Maintenance  Topic Date Due  . FOOT EXAM  10/08/2015  . URINE MICROALBUMIN  10/11/2015  . PNA vac Low Risk Adult (2 of 2 - PCV13) 10/06/2023 (Originally 05/24/2014)  . MAMMOGRAM  03/08/2048 (Originally 03/08/2012)  . OPHTHALMOLOGY EXAM  04/06/2016  . HEMOGLOBIN A1C  04/22/2016  . TETANUS/TDAP  02/08/2022  . INFLUENZA VACCINE  Completed  . DEXA SCAN  Completed  . ZOSTAVAX  Completed        Plan:    I have personally reviewed and addressed the Medicare Annual Wellness questionnaire and have noted the following in the patient's chart:  A. Medical and social history B. Use of alcohol, tobacco or illicit drugs  C. Current medications and supplements D. Functional ability and status E.  Nutritional status F.  Physical activity G. Advance directives H. List of other physicians I.  Hospitalizations, surgeries, and ER visits in previous 12 months J.  [place] to include hearing, vision, cognitive, depression L. Referrals and appointments - none  In addition, I have reviewed and discussed with patient certain preventive protocols, quality metrics, and best practice recommendations. A written personalized care plan for preventive services as well as general preventive health recommendations were provided to patient.  See attached scanned questionnaire for additional information.   Signed,   Allyn Kenner, LPN Health Advisor    I reviewed health advisor's note, was available for consultation and agree with the assessment and plan as written.  When I saw Mrs. Castles (as in my med mgt note that followed), she did report more difficulty hearing and recommendations were given for audiology referrals.  She admitted to depression since moving  but it was mostly stemmed around her husband's difficulty adjusting and not being nice to her during caregiving.  We are going to get more help for them from a caregiver if her daughter notes this is needed with showers so we can relieve his stress.  Hopefully, this will make her feel better as well. She  already has home health OT, PT involved since before they moved.  Didn't realize until later that urine micro and foot exam were due--actually seeing podiatry so noted last date seen there.    Amorie Rentz L. Sameen Leas, D.O. [facility] [address], [place] [address] Cell Phone (Mon-Fri 8am-5pm):  [phone] On Call:  [phone] & follow prompts after 5pm & weekends Office Phone:  [phone] Office Fax:  [phone]

## 2016-01-19 NOTE — Progress Notes (Signed)
Location:   [place]   Place of Service:   clinic  Provider: Amariah Kierstead L. Mariea Clonts, D.O., C.M.D.  Code Status: DNR Goals of Care:  Advanced Directives 01/19/2016  Does patient have an advance directive? Yes  Type of Advance Directive [place]  Does patient want to make changes to advanced directive? -  Copy of advanced directive(s) in chart? Yes  Would patient like information on creating an advanced directive? -  Pre-existing out of facility DNR order (yellow form or pink MOST form) -     Chief Complaint  Patient presents with  . Medical Management of Chronic Issues    3 mth follow-up    HPI: Patient is a 82 y.o. female seen today for medical management of chronic diseases.    She and her husband just recently moved to [facility].  I reviewed the AWV done by Alisa, LPN and it showed PHQ9 of 11 indicating significant depression.  Her husband is having more difficulty adjusting and  he's been having to take care of her there.  He's been ill and not nice.  She is not sleeping well.  Has had a couple of falls since moving.  Knees are wanting to buckle on her.  Her husband is frustrated and tired.  He's making it very hard on her.  She has a handheld shower and bench.  She can bathe herself with some cuing.  OT has been coming weekly even before they moved to [facility].    She has been more sleepy.  She also is a bit more confused that usual.  Hearing has gotten worse.    Past Medical History:  Diagnosis Date  . Anemia   . Breast cancer ([place])    LEFT BREAST  . Dizziness   . Hyperglycemia   . Hyperlipidemia   . Hypertension   . Hypophonia   . Hypothyroidism   . Kidney failure    stage 3  . Left ventricular diastolic dysfunction   . Orthostatic hypotension   . Parkinson's disease   . Prediabetes   . TIA (transient ischemic attack)   . Tremor     Past Surgical History:  Procedure Laterality Date  . BREAST LUMPECTOMY  2010  . CARDIOVASCULAR  STRESS TEST  06/2007   NORMAL  . CATARACT EXTRACTION  2005,2006   Dr.Devonzo  . CHOLECYSTECTOMY  1994  . NASAL SINUS SURGERY     submucous resection late 1950s  . TONSILLECTOMY      Allergies  Allergen Reactions  . Aciphex [Rabeprazole Sodium]     unknown  . Amantadine Other (See Comments)    Pt cannot remember  . Amantadine Hcl     unknown  . Amantadines     unknown  . Avapro [Irbesartan]     unknown  . Cardura [Doxazosin Mesylate]     unknown  . Ciprofloxacin     unknown  . Clonidine Derivatives     unknown  . Cozaar     nightmares  . Diltiazem     unknown  . Doxycycline Hives  . Famotidine     unknown  . Hctz [Hydrochlorothiazide]     unknown  . Indapamide     unknown  . Lansoprazole     unknown  . Lisinopril     Mouth problems  . Loratadine     unknown  . Maxidex [Dexamethasone]     unknown  . Mirapex [Pramipexole Dihydrochloride]     unknown  .  Norvasc [Amlodipine Besylate]     unknown  . Pantoprazole Sodium     unknown  . Prilosec [Omeprazole]     unknown  . Procardia [Nifedipine]     unknown  . Proton Pump Inhibitors     unknown  . Sulfa Drugs Cross Reactors     unknown  . Trimethoprim     unknown  . Zyrtec [Cetirizine Hcl]     unknown  . Metronidazole Rash  . Penicillins Rash    Has patient had a PCN reaction causing immediate rash, facial/tongue/throat swelling, SOB or lightheadedness with hypotension: just a rash all over Has patient had a PCN reaction causing severe rash involving mucus membranes or skin necrosis: no Has patient had a PCN reaction that required hospitalization-no Has patient had a PCN reaction occurring within the last 10 years: no, more than 50 yrs ago If all of the above answers are "NO", then may proceed with Cephalosporin use.       Medication List       Accurate as of 01/19/16 11:05 AM. Always use your most recent med list.          aspirin 81 MG tablet Take 81 mg by mouth every evening.   AZILECT 1  MG Tabs tablet Generic drug:  rasagiline Take 1 mg by mouth daily.   BIOTIN PO Take 1,000 mcg by mouth daily.   carbidopa-levodopa 25-100 MG tablet Commonly known as:  SINEMET IR Take 1 tablet by mouth 3 (three) times daily.   clopidogrel 75 MG tablet Commonly known as:  PLAVIX Take 1 tablet (75 mg total) by mouth every evening.   Cranberry 500 MG Caps Take 500 mg by mouth daily.   cycloSPORINE 0.05 % ophthalmic emulsion Commonly known as:  RESTASIS Place 1 drop into both eyes 2 (two) times daily.   docusate sodium 100 MG capsule Commonly known as:  COLACE Take 100 mg by mouth 2 (two) times daily.   Fish Oil 1200 MG Caps Take 1 capsule by mouth 1 day or 1 dose.   folic acid Q000111Q MCG tablet Commonly known as:  FOLVITE Take 800 mcg by mouth daily.   furosemide 40 MG tablet Commonly known as:  LASIX Take 1 tablet (40 mg total) by mouth daily.   gabapentin 100 MG capsule Commonly known as:  NEURONTIN Take 100 mg by mouth at bedtime. One additional PRN   levothyroxine 175 MCG tablet Commonly known as:  SYNTHROID Take one tablet by mouth once daily 30 minutes before breakfast for thyroid.   Metoprolol Tartrate 75 MG Tabs Take 75 mg by mouth 2 (two) times daily.   multivitamin with minerals Tabs tablet Take 1 tablet by mouth daily.   POLY-IRON 150 150 MG capsule Generic drug:  iron polysaccharides Take ONE (1) capsule by mouth daily   spironolactone 25 MG tablet Commonly known as:  ALDACTONE Take 1 tablet (25 mg total) by mouth daily.   SYSTANE OP Apply 1 drop to eye 2 (two) times daily as needed (dryness).   Vitamin D3 2000 units Tabs Take 1 tablet by mouth daily.       Review of Systems:  Review of Systems  Constitutional: Positive for malaise/fatigue. Negative for chills and fever.  HENT: Positive for hearing loss.   Eyes: Positive for blurred vision.  Respiratory: Negative for cough and shortness of breath.   Cardiovascular: Negative for chest  pain, palpitations and leg swelling.  Gastrointestinal: Negative for abdominal pain, blood in stool, constipation, diarrhea  and melena.  Genitourinary: Negative for dysuria.  Musculoskeletal: Positive for back pain and falls. Negative for joint pain.  Skin: Negative for rash.  Neurological: Positive for weakness. Negative for dizziness and loss of consciousness.  Endo/Heme/Allergies: Bruises/bleeds easily.  Psychiatric/Behavioral: Positive for depression and memory loss. The patient is not nervous/anxious and does not have insomnia.     Health Maintenance  Topic Date Due  . FOOT EXAM  10/08/2015  . URINE MICROALBUMIN  10/11/2015  . PNA vac Low Risk Adult (2 of 2 - PCV13) 10/06/2023 (Originally 05/24/2014)  . MAMMOGRAM  03/08/2048 (Originally 03/08/2012)  . OPHTHALMOLOGY EXAM  04/06/2016  . HEMOGLOBIN A1C  04/22/2016  . TETANUS/TDAP  02/08/2022  . INFLUENZA VACCINE  Completed  . DEXA SCAN  Completed  . ZOSTAVAX  Completed    Physical Exam: Vitals:   01/19/16 1055  BP: (!) 148/74  Pulse: 79  Temp: 97.5 F (36.4 C)  TempSrc: Oral  SpO2: 99%  Weight: 113 lb (51.3 kg)  Height: 5' (1.524 m)   Body mass index is 22.07 kg/m. Physical Exam  Constitutional: She appears well-developed and well-nourished. No distress.  Cardiovascular: Normal rate and regular rhythm.   Pulmonary/Chest: Effort normal and breath sounds normal.  Abdominal: Soft. Bowel sounds are normal.  Musculoskeletal: Normal range of motion.  Walks with rollator walker  Neurological: She is alert. She exhibits abnormal muscle tone.  Skin: Skin is warm and dry. There is pallor.  Psychiatric: She has a normal mood and affect.    Labs reviewed: Basic Metabolic Panel:  Recent Labs  04/21/15 0929 06/06/15 0936 06/20/15 1421 10/21/15 0909 10/24/15 1215  NA 139  --  139 141 140  K 4.8  --  4.6 4.4 4.2  CL 96  --  99* 101 102  CO2 25  --  28 29 25   GLUCOSE 98  --  134* 114* 99  BUN 36*  --  35* 44* 45*    CREATININE 1.10*  --  1.04* 1.37* 1.33*  CALCIUM 9.3  --  9.8 9.1 8.6  TSH 9.860* 5.620*  --  2.78  --    Liver Function Tests:  Recent Labs  01/25/15 1151 04/21/15 0929 10/21/15 0909  AST 75* 44* 50*  ALT 78* 36* 30*  ALKPHOS 176* 166* 150*  BILITOT 0.4 0.4 0.6  PROT 8.4* 7.1 6.9  ALBUMIN 4.2 3.9 3.8    Recent Labs  01/25/15 1151  LIPASE 33   No results for input(s): AMMONIA in the last 8760 hours. CBC:  Recent Labs  03/17/15 04/21/15 0929 06/20/15 1421 07/11/15 1000 10/21/15 0909  WBC 6.4 6.7 6.0 6.1 7.4  NEUTROABS  --  3.7  --  3.4 4,292  HGB 11.5*  --  11.5*  --  12.2  HCT 36 36.9 35.9* 35.4 37.8  MCV  --  96 100.3* 97 99.5  PLT 127* 148* 125* 132* 153   Lipid Panel:  Recent Labs  10/21/15 0909  CHOL 200  HDL 40*  LDLCALC 125  TRIG 175*  CHOLHDL 5.0   Lab Results  Component Value Date   HGBA1C 6.2 (H) 10/21/2015    Assessment/Plan 1. Moderate single current episode of major depressive disorder (HCC) -seems this is due to the move to [employer] primarily b/c her husband who has early dementia is not adjusting well and has become very grumpy with her when providing care -we decided to have her daughter assess her ability to bathe herself--if she needs assistance, I  will add home health aide to her home care orders--pt did not want the OT to watch her shower to do this assessment -we'll see if that does not take some burden off her husband and improve his mood a little--also asked them to schedule him an appt with me soon  2. Parkinson's disease ([place]) -cont sinemet tid per Dr, Tat, azilect daily and gabapentin for her neuropathy at hs -discussing simplifying her med regimen if possible to tid so alarmed pillbox can be used and things will just be easier as her husband's dementia is also worsening  3. Falls frequently -due to increased weakness since her husband is quick to put her in her wheelchair rather than helping her walk to do her  care -may need additional PT also  4. Physical debility -is progressing gradually with knees buckling more -cont rollator use and therapy  5. Sensorineural hearing loss (SNHL) of both ears -new, discussed audiology eval at [facility] if an audiologist comes there, otherwise [facility]  Labs/tests ordered:  No orders of the defined types were placed in this encounter.  Next appt:  03/18/2016 med mgt, and prn  Taiki Buckwalter L. Naz Denunzio, D.O. [facility] [address], [place] [address] Cell Phone (Mon-Fri 8am-5pm):  [phone] On Call:  [phone] & follow prompts after 5pm & weekends Office Phone:  [phone] Office Fax:  [phone]

## 2016-01-20 DIAGNOSIS — G20 Parkinson's disease: Secondary | ICD-10-CM | POA: Diagnosis not present

## 2016-01-20 DIAGNOSIS — M6281 Muscle weakness (generalized): Secondary | ICD-10-CM | POA: Diagnosis not present

## 2016-01-20 DIAGNOSIS — R2681 Unsteadiness on feet: Secondary | ICD-10-CM | POA: Diagnosis not present

## 2016-01-20 DIAGNOSIS — I5033 Acute on chronic diastolic (congestive) heart failure: Secondary | ICD-10-CM | POA: Diagnosis not present

## 2016-01-20 DIAGNOSIS — L89522 Pressure ulcer of left ankle, stage 2: Secondary | ICD-10-CM | POA: Diagnosis not present

## 2016-01-23 ENCOUNTER — Other Ambulatory Visit: Payer: Self-pay | Admitting: Neurology

## 2016-01-23 DIAGNOSIS — L89522 Pressure ulcer of left ankle, stage 2: Secondary | ICD-10-CM | POA: Diagnosis not present

## 2016-01-23 DIAGNOSIS — R2681 Unsteadiness on feet: Secondary | ICD-10-CM | POA: Diagnosis not present

## 2016-01-23 DIAGNOSIS — I5033 Acute on chronic diastolic (congestive) heart failure: Secondary | ICD-10-CM | POA: Diagnosis not present

## 2016-01-23 DIAGNOSIS — G20 Parkinson's disease: Secondary | ICD-10-CM | POA: Diagnosis not present

## 2016-01-23 DIAGNOSIS — M6281 Muscle weakness (generalized): Secondary | ICD-10-CM | POA: Diagnosis not present

## 2016-01-23 IMAGING — CT CT ANGIO CHEST
3 of 7 series · 18 of 36 positions shown · IV contrast (Omnipaque 300)
Comparison: 11/21/2013 chest radiograph

CLINICAL DATA: Increased shortness of breath. Fall with back
fractures in [REDACTED]. History of breast cancer 5 years ago.

EXAM:
CT ANGIOGRAPHY CHEST WITH CONTRAST
TECHNIQUE: Multidetector CT imaging of the chest was performed using the
standard protocol during bolus administration of intravenous
contrast. Multiplanar CT image reconstructions and MIPs were
obtained to evaluate the vascular anatomy.
CONTRAST:  80mL OMNIPAQUE IOHEXOL 350 MG/ML SOLN

[Series 5: thins (id) / (id) · axial · 0.58mm/px · z∈[-221,-15]mm · 15 of 236 slices shown]
[im 15/236  lung]
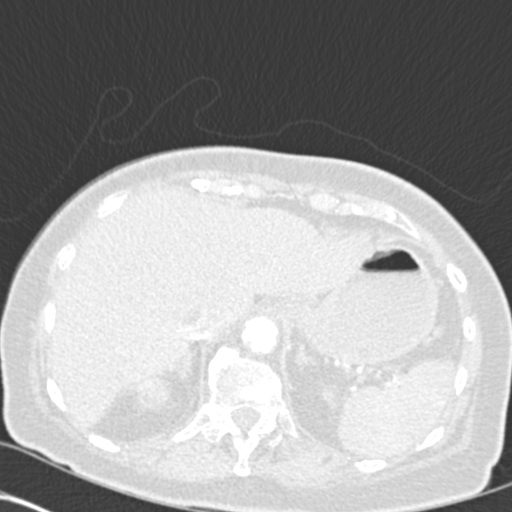
[im 30/236  mediastinal]
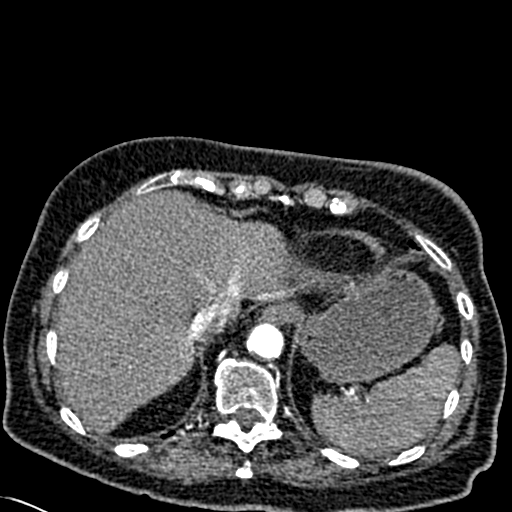
[im 45/236  lung]
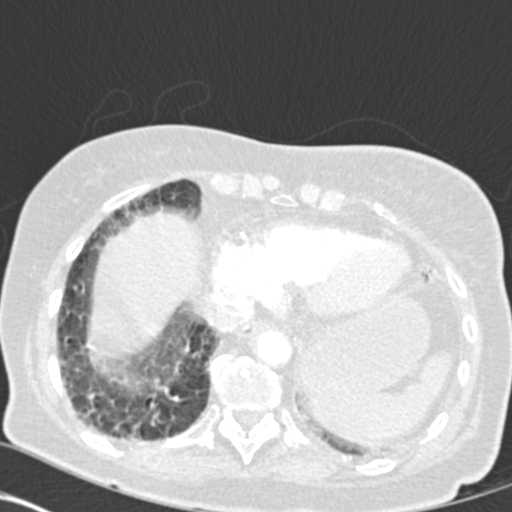
[im 59/236  mediastinal]
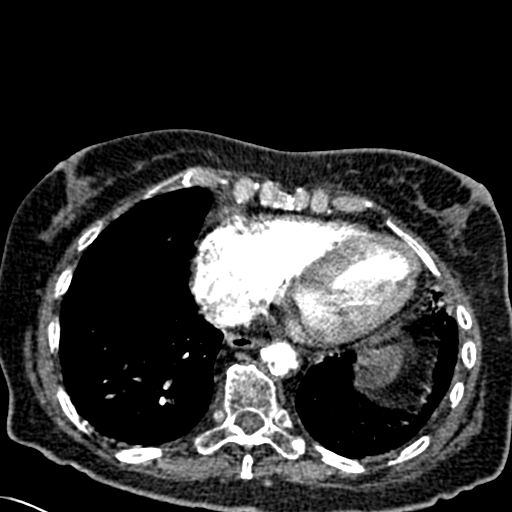
[im 74/236  lung]
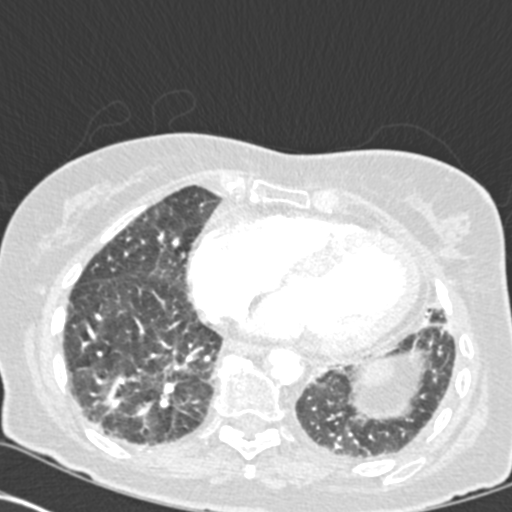
[im 89/236  mediastinal]
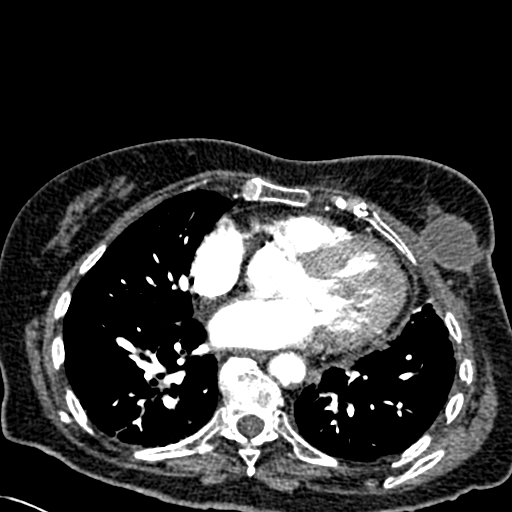
[im 103/236  lung]
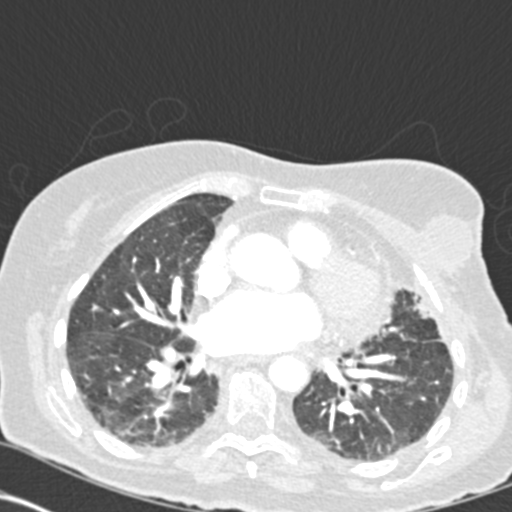
[im 118/236  mediastinal]
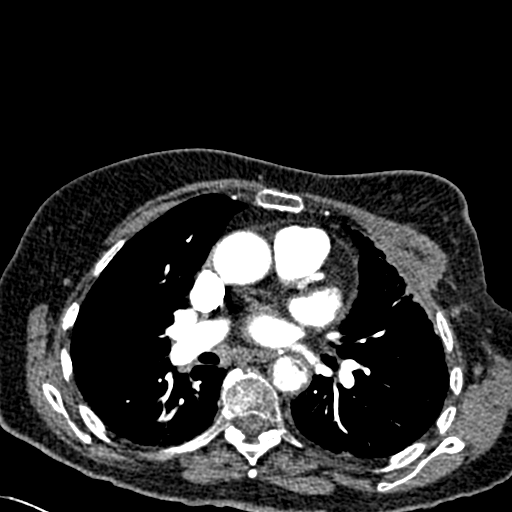
[im 133/236  lung]
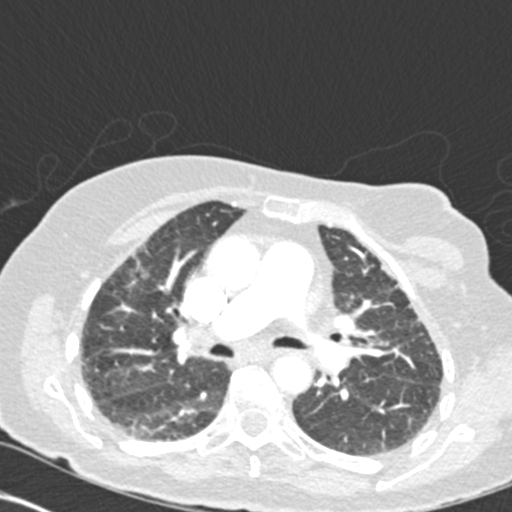
[im 147/236  mediastinal]
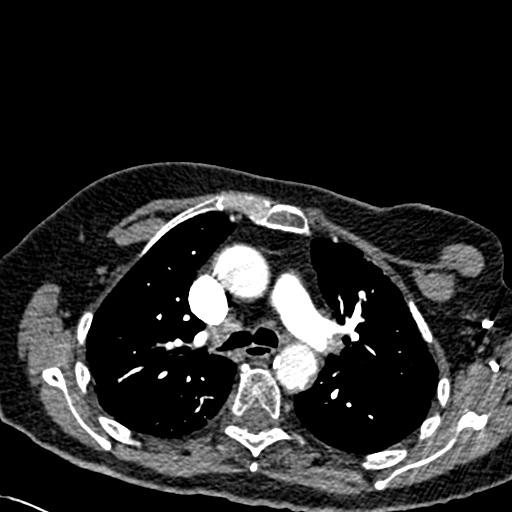
[im 162/236  lung]
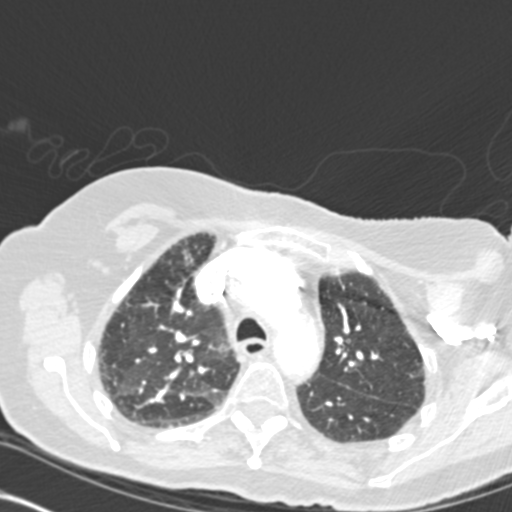
[im 177/236  mediastinal]
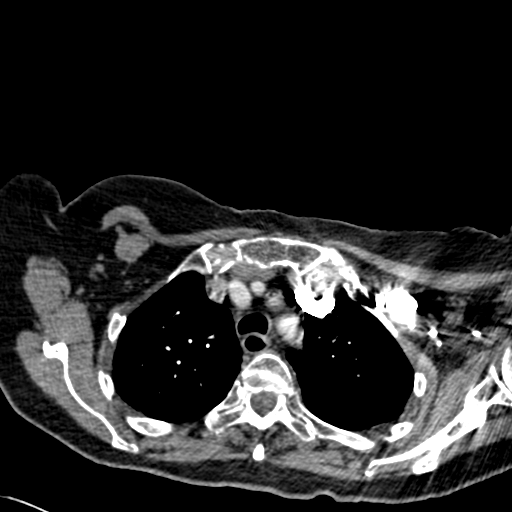
[im 191/236  lung]
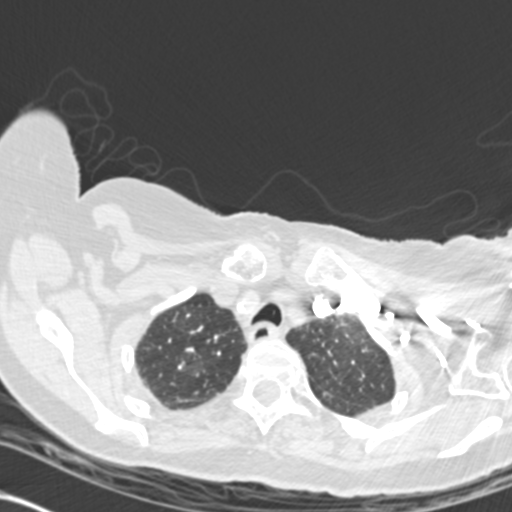
[im 206/236  mediastinal]
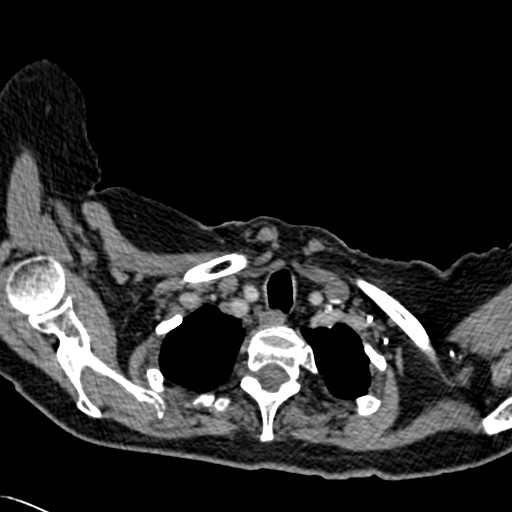
[im 221/236  lung]
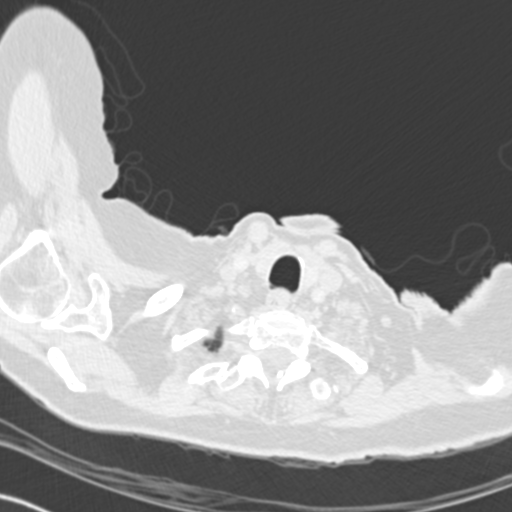

[Series 6: lung (id) / (id) · axial · 0.58mm/px · z∈[-175,-118]mm · 2 of 76 slices shown]
[im 19/76  mediastinal]
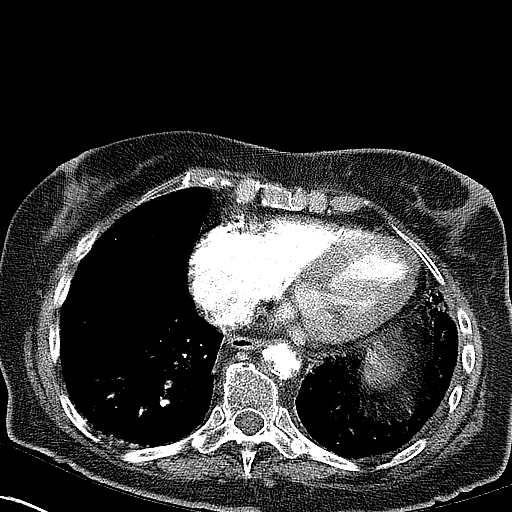
[im 38/76  mediastinal]
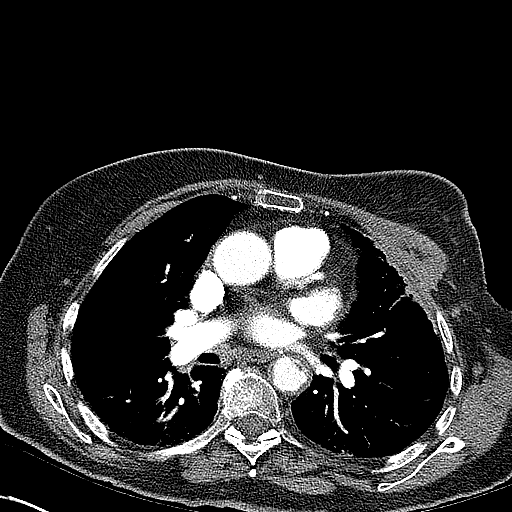

[Series 602: cor mpr · coronal · 0.58mm/px · 1 of 81 slices shown]
[im 41/81  mediastinal]
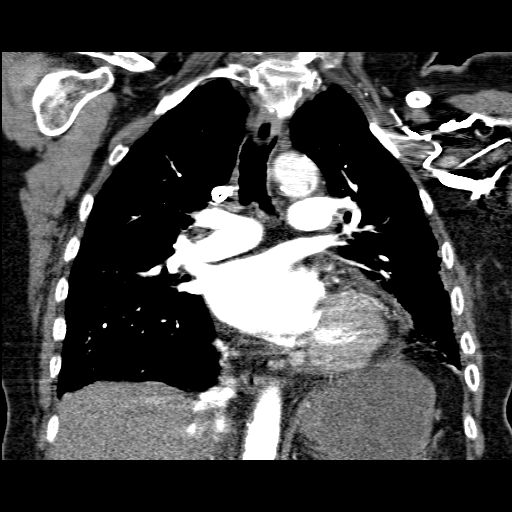

[18 of 36 positions shown; findings below may reference images not displayed]

FINDINGS: Breathing motion artifact obscures the lung bases.

Subject to this mild limitation, no filling defect is identified in
the pulmonary arterial tree to suggest pulmonary embolus.

Thoracic aortic atherosclerotic calcification noted with some mural
thrombus along the descending thoracic aorta. There is likely some
degree of coronary atherosclerosis. Mildly enlarged
cardiopericardial silhouette. Interventricular septal thickness
cm, borderline for left ventricular hypertrophy.

Cystic lesion in the left breast 2.8 by 3.5 cm, image 46 series 4.
Adjacent pleural thickening and subpleural interstitial accentuation
suggesting radiation therapy in the region.

Subacute left anterior third and fourth rib fractures could be
posttraumatic or due to the localized radiation therapy causing
weakening in the bone. Mild adjacent pleural thickening noted.

Thoracic spondylosis. Slight superior endplate concavities at T2 and
T3 are age indeterminate.

Mild mosaic attenuation in the lungs with equal visibility of
vessels in the darker regions. Bilateral airway thickening in the
lower lobes.

Review of the MIP images confirms the above findings.
IMPRESSION: 1. No pulmonary embolus is identified.
2. Subacute left anterior third and fourth rib fractures. This is in
the vicinity of localized radiation therapy, which may have
predispose to fracture. Mild adjacent pleural thickening.
3. Cystic lesion in the left breast 2.8 by 3.5 cm, probably post
procedural.
4. Mild superior endplate concavities at T2 and T3, age
indeterminate, likely subtle superior endplate compression
fractures.
5. Mosaic attenuation in the lungs may be from low-grade alveolar
infiltration from edema, or air trapping. There is notable airway
thickening in both lower lobes.
6. Atherosclerosis.

## 2016-01-26 ENCOUNTER — Other Ambulatory Visit: Payer: Self-pay | Admitting: Cardiology

## 2016-01-26 DIAGNOSIS — G20 Parkinson's disease: Secondary | ICD-10-CM | POA: Diagnosis not present

## 2016-01-26 DIAGNOSIS — L89522 Pressure ulcer of left ankle, stage 2: Secondary | ICD-10-CM | POA: Diagnosis not present

## 2016-01-26 DIAGNOSIS — I5033 Acute on chronic diastolic (congestive) heart failure: Secondary | ICD-10-CM | POA: Diagnosis not present

## 2016-01-26 DIAGNOSIS — M6281 Muscle weakness (generalized): Secondary | ICD-10-CM | POA: Diagnosis not present

## 2016-01-26 DIAGNOSIS — R2681 Unsteadiness on feet: Secondary | ICD-10-CM | POA: Diagnosis not present

## 2016-01-28 DIAGNOSIS — I5033 Acute on chronic diastolic (congestive) heart failure: Secondary | ICD-10-CM | POA: Diagnosis not present

## 2016-01-28 DIAGNOSIS — R2681 Unsteadiness on feet: Secondary | ICD-10-CM | POA: Diagnosis not present

## 2016-01-28 DIAGNOSIS — G20 Parkinson's disease: Secondary | ICD-10-CM | POA: Diagnosis not present

## 2016-01-28 DIAGNOSIS — L89522 Pressure ulcer of left ankle, stage 2: Secondary | ICD-10-CM | POA: Diagnosis not present

## 2016-01-28 DIAGNOSIS — M6281 Muscle weakness (generalized): Secondary | ICD-10-CM | POA: Diagnosis not present

## 2016-01-30 DIAGNOSIS — R2681 Unsteadiness on feet: Secondary | ICD-10-CM | POA: Diagnosis not present

## 2016-01-30 DIAGNOSIS — I5033 Acute on chronic diastolic (congestive) heart failure: Secondary | ICD-10-CM | POA: Diagnosis not present

## 2016-01-30 DIAGNOSIS — L89522 Pressure ulcer of left ankle, stage 2: Secondary | ICD-10-CM | POA: Diagnosis not present

## 2016-01-30 DIAGNOSIS — M6281 Muscle weakness (generalized): Secondary | ICD-10-CM | POA: Diagnosis not present

## 2016-01-30 DIAGNOSIS — G20 Parkinson's disease: Secondary | ICD-10-CM | POA: Diagnosis not present

## 2016-02-10 DIAGNOSIS — G20 Parkinson's disease: Secondary | ICD-10-CM | POA: Diagnosis not present

## 2016-02-10 DIAGNOSIS — I5033 Acute on chronic diastolic (congestive) heart failure: Secondary | ICD-10-CM | POA: Diagnosis not present

## 2016-02-10 DIAGNOSIS — M6281 Muscle weakness (generalized): Secondary | ICD-10-CM | POA: Diagnosis not present

## 2016-02-10 DIAGNOSIS — R2681 Unsteadiness on feet: Secondary | ICD-10-CM | POA: Diagnosis not present

## 2016-02-10 DIAGNOSIS — L89522 Pressure ulcer of left ankle, stage 2: Secondary | ICD-10-CM | POA: Diagnosis not present

## 2016-02-11 DIAGNOSIS — I5033 Acute on chronic diastolic (congestive) heart failure: Secondary | ICD-10-CM | POA: Diagnosis not present

## 2016-02-11 DIAGNOSIS — G20 Parkinson's disease: Secondary | ICD-10-CM | POA: Diagnosis not present

## 2016-02-11 DIAGNOSIS — R2681 Unsteadiness on feet: Secondary | ICD-10-CM | POA: Diagnosis not present

## 2016-02-11 DIAGNOSIS — M6281 Muscle weakness (generalized): Secondary | ICD-10-CM | POA: Diagnosis not present

## 2016-02-11 DIAGNOSIS — L89522 Pressure ulcer of left ankle, stage 2: Secondary | ICD-10-CM | POA: Diagnosis not present

## 2016-02-13 DIAGNOSIS — L89522 Pressure ulcer of left ankle, stage 2: Secondary | ICD-10-CM | POA: Diagnosis not present

## 2016-02-13 DIAGNOSIS — R2681 Unsteadiness on feet: Secondary | ICD-10-CM | POA: Diagnosis not present

## 2016-02-13 DIAGNOSIS — M6281 Muscle weakness (generalized): Secondary | ICD-10-CM | POA: Diagnosis not present

## 2016-02-13 DIAGNOSIS — I5033 Acute on chronic diastolic (congestive) heart failure: Secondary | ICD-10-CM | POA: Diagnosis not present

## 2016-02-13 DIAGNOSIS — G20 Parkinson's disease: Secondary | ICD-10-CM | POA: Diagnosis not present

## 2016-02-17 DIAGNOSIS — I5033 Acute on chronic diastolic (congestive) heart failure: Secondary | ICD-10-CM | POA: Diagnosis not present

## 2016-02-17 DIAGNOSIS — M6281 Muscle weakness (generalized): Secondary | ICD-10-CM | POA: Diagnosis not present

## 2016-02-17 DIAGNOSIS — L89522 Pressure ulcer of left ankle, stage 2: Secondary | ICD-10-CM | POA: Diagnosis not present

## 2016-02-17 DIAGNOSIS — G20 Parkinson's disease: Secondary | ICD-10-CM | POA: Diagnosis not present

## 2016-02-17 DIAGNOSIS — R2681 Unsteadiness on feet: Secondary | ICD-10-CM | POA: Diagnosis not present

## 2016-02-17 DIAGNOSIS — N6321 Unspecified lump in the left breast, upper outer quadrant: Secondary | ICD-10-CM | POA: Diagnosis not present

## 2016-02-17 DIAGNOSIS — Z853 Personal history of malignant neoplasm of breast: Secondary | ICD-10-CM | POA: Diagnosis not present

## 2016-02-17 LAB — HM MAMMOGRAPHY

## 2016-02-18 ENCOUNTER — Encounter: Payer: Self-pay | Admitting: *Deleted

## 2016-02-18 ENCOUNTER — Other Ambulatory Visit: Payer: Self-pay | Admitting: Radiology

## 2016-02-18 DIAGNOSIS — N6321 Unspecified lump in the left breast, upper outer quadrant: Secondary | ICD-10-CM | POA: Diagnosis not present

## 2016-02-18 DIAGNOSIS — N6032 Fibrosclerosis of left breast: Secondary | ICD-10-CM | POA: Diagnosis not present

## 2016-02-19 DIAGNOSIS — L89522 Pressure ulcer of left ankle, stage 2: Secondary | ICD-10-CM | POA: Diagnosis not present

## 2016-02-19 DIAGNOSIS — I5033 Acute on chronic diastolic (congestive) heart failure: Secondary | ICD-10-CM | POA: Diagnosis not present

## 2016-02-19 DIAGNOSIS — G20 Parkinson's disease: Secondary | ICD-10-CM | POA: Diagnosis not present

## 2016-02-19 DIAGNOSIS — M6281 Muscle weakness (generalized): Secondary | ICD-10-CM | POA: Diagnosis not present

## 2016-02-19 DIAGNOSIS — R2681 Unsteadiness on feet: Secondary | ICD-10-CM | POA: Diagnosis not present

## 2016-02-20 DIAGNOSIS — G20 Parkinson's disease: Secondary | ICD-10-CM | POA: Diagnosis not present

## 2016-02-20 DIAGNOSIS — I5033 Acute on chronic diastolic (congestive) heart failure: Secondary | ICD-10-CM | POA: Diagnosis not present

## 2016-02-20 DIAGNOSIS — M6281 Muscle weakness (generalized): Secondary | ICD-10-CM | POA: Diagnosis not present

## 2016-02-20 DIAGNOSIS — R2681 Unsteadiness on feet: Secondary | ICD-10-CM | POA: Diagnosis not present

## 2016-02-20 DIAGNOSIS — L89522 Pressure ulcer of left ankle, stage 2: Secondary | ICD-10-CM | POA: Diagnosis not present

## 2016-02-23 ENCOUNTER — Other Ambulatory Visit: Payer: Self-pay | Admitting: Internal Medicine

## 2016-02-23 ENCOUNTER — Other Ambulatory Visit: Payer: Self-pay | Admitting: Cardiology

## 2016-02-23 ENCOUNTER — Telehealth: Payer: Self-pay | Admitting: Neurology

## 2016-02-23 DIAGNOSIS — H16213 Exposure keratoconjunctivitis, bilateral: Secondary | ICD-10-CM | POA: Diagnosis not present

## 2016-02-23 DIAGNOSIS — M3501 Sicca syndrome with keratoconjunctivitis: Secondary | ICD-10-CM | POA: Diagnosis not present

## 2016-02-23 NOTE — Telephone Encounter (Signed)
Daughter had said that was fine.

## 2016-02-23 NOTE — Progress Notes (Signed)
Victoria Lindsey was seen today in the movement disorders clinic for neurologic consultation at the request of REED, TIFFANY, DO.    I have reviewed prior records made available to me.  She is accompanied by her daughter supplements the history.  She previously saw Dr. Baltazar Lindsey at [facility] and last saw her on 07/21/2015.  While I have her last note, I do not have records prior to that.  The patient reports that she was diagnosed with Parkinson's disease approximately between 5 and 10 years ago (she cannot remember).  She has trouble recalling her first sx but her daughter thinks that hypophonia may have been a first sx.  Records indicate she has been on a number of Parkinson's medications.  It stated that she did not tolerate levodopa (GI upset), Requip, Mirapex or amantadine.  The side effects with these other medications is not mentioned within Dr. Anson Lindsey most recent note.  She is on neupro 8 mg and has been on that for several years.  She tried to d/c it once and sx's got worse.  She is also on azilect 1 mg daily.  The patient is on gabapentin, 100mg  q hs for restless leg syndrome.  Pt had a CT brain on 06/20/15 that I reviewed.  Mod atrophy and mild WMD  11/03/15 update:  Pt f/u today for PD, accompanied by her daughter, who supplements the history.  The records that were made available to me were reviewed since last visit.  I was able to get her started on levodopa and she has worked to 1 po bid (8-10am/7-9pm).  Her daughter has called a few times.  She seemed excessively sleepy and we ended up d/c her neupro on 8/15.  Her daughter called on 8/22 to state that pt is more restless and anxious and they are f/u today in that regard.  somedays she has this all day long and some days it is just part of the day.  She thinks that it is more evening but states that it isn't anxiety and has trouble describing it.  May be jitteriness.  No falls but not walking as well since d/c the neupro.  No hallucinations.   Husband has dementia and memory worse than pts per daughter but he is resistant to getting help or leaving the home.  Addressed with him multiple times per daughter.  Had MBE on 09/16/15 with mild pharyngeal phase dysphagia and suspected primary esophageal dysphagia and regular diet with thin liquids was recommended.  11/21/15 update:  The patient follows up today, accompanied by her daughter who supplements the history.  I wanted her to work up to carbidopa/levodopa 25/100 tid but she is taking 1/0.5/1.   I told her she can take an extra half tablet if she needs, particularly for restless leg and she hasn't needed that.  She thinks that RLS is somewhat better.   She is having trouble with sleep, both getting to and staying sleep.  Last visit, I talked to her extensively about her home living situation.  Her husband is very resistant to leaving the home and he has worse memory problems than she does.  Daughter states that pts husband refused to come and meet "that doctor" today.  Daughter states that they have had lots of conversations and he seems a bit open to independent assisted living with step up care.  They had someone from [employer] [facility] come to the house and talk with them about options.  02/25/16 update:  The patient follows up today, accompanied by her daughter who supplement the history.  She is on carbidopa/levodopa 25/100, one tablet tid.  Daughter notes that few times cannot get out of bed but that was before medication was taken.  That only happened few days.   Had a few falls but didn't get hurt.  Daughter cannot tell me exact circumstance.  Pt denies lightheadedness, near syncope.  I have reviewed records since our last visit.  Her metoprolol was decreased because of orthostatic hypotension.  She has had a few instances of hallucinations.  Her daughter states that she will tell her a cat walked by or someone walked by.  Its not scary to her.  She has some illusions.  She keeps  thinking her sprinkler on the ceiling is a bug.  She thinks people are coming into the apartment and no one is there.    She admits to dry eye and is unsure if that is contributing.  She is on Restasis.  She has moved to [place] since our last visit.  Mood has been good.  They ask me about RF the gabapentin for RLS.     PREVIOUS MEDICATIONS: Sinemet, Mirapex, Requip, Amantadine and Neupro  ALLERGIES:   Allergies  Allergen Reactions  . Aciphex [Rabeprazole Sodium]     unknown  . Amantadine Other (See Comments)    Pt cannot remember  . Amantadine Hcl     unknown  . Amantadines     unknown  . Avapro [Irbesartan]     unknown  . Cardura [Doxazosin Mesylate]     unknown  . Ciprofloxacin     unknown  . Clonidine Derivatives     unknown  . Cozaar     nightmares  . Diltiazem     unknown  . Doxycycline Hives  . Famotidine     unknown  . Hctz [Hydrochlorothiazide]     unknown  . Indapamide     unknown  . Lansoprazole     unknown  . Lisinopril     Mouth problems  . Loratadine     unknown  . Maxidex [Dexamethasone]     unknown  . Mirapex [Pramipexole Dihydrochloride]     unknown  . Norvasc [Amlodipine Besylate]     unknown  . Pantoprazole Sodium     unknown  . Prilosec [Omeprazole]     unknown  . Procardia [Nifedipine]     unknown  . Proton Pump Inhibitors     unknown  . Sulfa Drugs Cross Reactors     unknown  . Trimethoprim     unknown  . Zyrtec [Cetirizine Hcl]     unknown  . Metronidazole Rash  . Penicillins Rash    Has patient had a PCN reaction causing immediate rash, facial/tongue/throat swelling, SOB or lightheadedness with hypotension: just a rash all over Has patient had a PCN reaction causing severe rash involving mucus membranes or skin necrosis: no Has patient had a PCN reaction that required hospitalization-no Has patient had a PCN reaction occurring within the last 10 years: no, more than 50 yrs ago If all of the above answers are "NO", then  may proceed with Cephalosporin use.     CURRENT MEDICATIONS:  Outpatient Encounter Prescriptions as of 02/25/2016  Medication Sig  . aspirin 81 MG tablet Take 81 mg by mouth every evening.   Marland Kitchen BIOTIN PO Take 1,000 mcg by mouth daily.   . carbidopa-levodopa (SINEMET IR) 25-100 MG tablet TAKE ONE TABLET BY  MOUTH THREE TIMES DAILY  . Cholecalciferol (VITAMIN D3) 2000 UNITS TABS Take 1 tablet by mouth daily.   . clopidogrel (PLAVIX) 75 MG tablet TAKE ONE TABLET BY MOUTH EVERY EVENING  . Cranberry 500 MG CAPS Take 500 mg by mouth daily.   . cycloSPORINE (RESTASIS) 0.05 % ophthalmic emulsion Place 1 drop into both eyes 2 (two) times daily.  Marland Kitchen docusate sodium (COLACE) 100 MG capsule Take 100 mg by mouth 2 (two) times daily.   . folic acid (FOLVITE) Q000111Q MCG tablet Take 800 mcg by mouth daily.   . furosemide (LASIX) 40 MG tablet Take 1 tablet (40 mg total) by mouth daily.  Marland Kitchen gabapentin (NEURONTIN) 100 MG capsule Take 100 mg by mouth at bedtime. One additional PRN  . levothyroxine (SYNTHROID) 175 MCG tablet Take one tablet by mouth once daily 30 minutes before breakfast for thyroid.  . metoprolol 75 MG TABS Take 75 mg by mouth 2 (two) times daily.  . Multiple Vitamin (MULTIVITAMIN WITH MINERALS) TABS tablet Take 1 tablet by mouth daily.  . Omega-3 Fatty Acids (FISH OIL) 1200 MG CAPS Take 1 capsule by mouth 1 day or 1 dose.  Marland Kitchen POLY-IRON 150 150 MG capsule TAKE ONE CAPSULE BY MOUTH DAILY  . Polyethyl Glycol-Propyl Glycol (SYSTANE OP) Apply 1 drop to eye 2 (two) times daily as needed (dryness).  . Probiotic Product (ALIGN) 4 MG CAPS Take by mouth.  . rasagiline (AZILECT) 1 MG TABS Take 1 mg by mouth daily.  Marland Kitchen spironolactone (ALDACTONE) 25 MG tablet TAKE ONE TABLET BY MOUTH DAILY  . [DISCONTINUED] clopidogrel (PLAVIX) 75 MG tablet Take 1 tablet (75 mg total) by mouth every evening.   No facility-administered encounter medications on file as of 02/25/2016.     PAST MEDICAL HISTORY:   Past Medical  History:  Diagnosis Date  . Anemia   . Breast cancer ([place])    LEFT BREAST  . Dizziness   . Hyperglycemia   . Hyperlipidemia   . Hypertension   . Hypophonia   . Hypothyroidism   . Kidney failure    stage 3  . Left ventricular diastolic dysfunction   . Orthostatic hypotension   . Parkinson's disease   . Prediabetes   . TIA (transient ischemic attack)   . Tremor     PAST SURGICAL HISTORY:   Past Surgical History:  Procedure Laterality Date  . BREAST LUMPECTOMY  2010  . CARDIOVASCULAR STRESS TEST  06/2007   NORMAL  . CATARACT EXTRACTION  2005,2006   Dr.Devonzo  . CHOLECYSTECTOMY  1994  . NASAL SINUS SURGERY     submucous resection late 1950s  . TONSILLECTOMY      SOCIAL HISTORY:   Social History   Social History  . Marital status: Married    Spouse name: N/A  . Number of children: N/A  . Years of education: N/A   Occupational History  . retired     [employer] (but retired for 40+ years)   Social History Main Topics  . Smoking status: Former Smoker    Packs/day: 0.50    Years: 12.00    Types: Cigarettes    Quit date: 03/08/1964  . Smokeless tobacco: Never Used  . Alcohol use No  . Drug use: No  . Sexual activity: No   Other Topics Concern  . Not on file   Social History Narrative   Quit smoking in 1967   Caffeine: Tea and chocolate    Married since 1951   Lives  in a house, 1 stories, 2 persons, no pets   Current or past profession- homemaker   Exercise- Yes, Physical Therapy, Occupational Therapy   Living Will-Yes   POA/HPOA-Yes     FAMILY HISTORY:   Family Status  Relation Status  . Mother Deceased at age 4   heart disease  . Father Deceased at age 15   heart disease, parkinsonism  . Brother Deceased at age 43   MVA  . Sister Deceased   unknown cause of death  . Brother Deceased   prostate CA  . Brother Deceased at age 37   "neurological" but ? cause  . Daughter Alive  . Sister     ROS:  A complete 10 system review of systems  was obtained and was unremarkable apart from what is mentioned above.  PHYSICAL EXAMINATION:    VITALS:   Vitals:   02/25/16 1040  BP: 120/72  Pulse: 70  Weight: 114 lb (51.7 kg)  Height: 5\' 1"  (1.549 m)    GEN:  The patient appears stated age and is in NAD. HEENT:  Normocephalic, atraumatic.  The mucous membranes are moist. The superficial temporal arteries are without ropiness or tenderness. CV:  RRR Lungs:  CTAB Neck/HEME:  There are no carotid bruits bilaterally.  Neurological examination:  Orientation: She is oriented today x 3 Montreal Cognitive Assessment  11/03/2015  Visuospatial/ Executive (0/5) 4  Naming (0/3) 2  Attention: Read list of digits (0/2) 2  Attention: Read list of letters (0/1) 1  Attention: Serial 7 subtraction starting at 100 (0/3) 0  Language: Repeat phrase (0/2) 0  Language : Fluency (0/1) 0  Abstraction (0/2) 2  Delayed Recall (0/5) 0  Orientation (0/6) 4  Total 15  Adjusted Score (based on education) 15   Cranial nerves: There is good facial symmetry.  There is facial hypomimia. The speech is fluent and clear but she is hypophonic. Soft palate rises symmetrically and there is no tongue deviation. Hearing is intact to conversational tone. Sensation: Sensation is intact to light touch throughout Motor: Strength is 5/5 in the bilateral upper and lower extremities.   Shoulder shrug is equal and symmetric.  There is no pronator drift.   Movement examination: Tone: There is normal tone in the bilateral upper extremities.  The tone in the lower extremities is normal.  Abnormal movements: There is no tremor today Coordination:  There is decremation with RAM's, with heel taps/toe taps bilaterally, L more than R Gait and Station: The patient has difficulty arising out of a deep-seated chair without the use of the hands and makes several attempts but is unable.   The patient's stride length is good with the rollator  ASSESSMENT/PLAN:  1.  Idiopathic  Parkinson's disease.   -We discussed the diagnosis as well as pathophysiology of the disease.  We discussed treatment options as well as prognostic indicators.  Patient education was provided.  -Greater than 50% of the 25 minute visit was spent in counseling answering questions and talking about what to expect now as well as in the future.    - Prior records indicate pt didn't tolerate levodopa (GI upset) Requip, Mirapex or amantadine.   Have been able to get her to tolerate levodopa by starting slow.  I d/c the neupro secondary to cognitive change.  Has been able to tolerate levodopa now with slow titration and will continue carbidopa/levodopa 25/100 tid.  Risks, benefits, side effects and alternative therapies were discussed.  The opportunity to ask questions  was given and they were answered to the best of my ability.  The patient expressed understanding and willingness to follow the outlined treatment protocols.  -hold gabapentin for RLS.  Think that we can use levodopa at night (25/100 CR) for this if needed for RLS if restarts after gabapentin is d/c.  Worried about need for gabapentin and that gabapentin could make confusion worse.    2.  Sialorrhea  -only at night but she has trouble with dry mouth in day because of sjogrens syndrome.    -talked about proper hydration as she isn't drinking   3.  Dysphagia  -Had MBE on 09/16/15 with mild pharyngeal phase dysphagia and suspected primary esophageal dysphagia and regular diet with thin liquids was recommended.  4.  HTN  -talked to her about lopressor.  Want to keep close eye on BP as PD will lower this as well.   Having lightheadedness.   F/u with Dr Marlou Porch in this regard.    5.  Parkinson's disease Dementia  -having hallucinations.  Discussed nuplazid and daughter/patient decided to hold on that.  Discussed black box warning.  6.  Insomnia  -start melatonin 3 mg q hs  7.  Follow up is anticipated in the next few months, sooner should new  neurologic issues arise.

## 2016-02-23 NOTE — Telephone Encounter (Signed)
Patient taking Gabapentin 100 mg - one at bedtime. This was prescribed from Dr. Baltazar Najjar in the past. Okay to fill medication?

## 2016-02-23 NOTE — Telephone Encounter (Signed)
Can we discuss on Wednesday?  She has an appt then.

## 2016-02-23 NOTE — Telephone Encounter (Signed)
Left message on machine for patient's daughter to call back.   

## 2016-02-23 NOTE — Telephone Encounter (Signed)
Victoria Lindsey called and asked if a prescription could be called in but did not say what the prescription was for/Dawn CB# [phone]

## 2016-02-24 DIAGNOSIS — L89522 Pressure ulcer of left ankle, stage 2: Secondary | ICD-10-CM | POA: Diagnosis not present

## 2016-02-24 DIAGNOSIS — R2681 Unsteadiness on feet: Secondary | ICD-10-CM | POA: Diagnosis not present

## 2016-02-24 DIAGNOSIS — M6281 Muscle weakness (generalized): Secondary | ICD-10-CM | POA: Diagnosis not present

## 2016-02-24 DIAGNOSIS — G20 Parkinson's disease: Secondary | ICD-10-CM | POA: Diagnosis not present

## 2016-02-24 DIAGNOSIS — I5033 Acute on chronic diastolic (congestive) heart failure: Secondary | ICD-10-CM | POA: Diagnosis not present

## 2016-02-25 ENCOUNTER — Ambulatory Visit (INDEPENDENT_AMBULATORY_CARE_PROVIDER_SITE_OTHER): Payer: Medicare Other | Admitting: Neurology

## 2016-02-25 ENCOUNTER — Encounter: Payer: Self-pay | Admitting: Neurology

## 2016-02-25 VITALS — BP 120/72 | HR 70 | Ht 61.0 in | Wt 114.0 lb

## 2016-02-25 DIAGNOSIS — R443 Hallucinations, unspecified: Secondary | ICD-10-CM

## 2016-02-25 DIAGNOSIS — F0280 Dementia in other diseases classified elsewhere without behavioral disturbance: Secondary | ICD-10-CM | POA: Diagnosis not present

## 2016-02-25 DIAGNOSIS — G20 Parkinson's disease: Secondary | ICD-10-CM | POA: Diagnosis not present

## 2016-02-25 NOTE — Patient Instructions (Signed)
1.  Hold gabapentin 2.  Call next week and let us know how your restless leg is doing 3.  Merry Christmas

## 2016-02-27 DIAGNOSIS — L89522 Pressure ulcer of left ankle, stage 2: Secondary | ICD-10-CM | POA: Diagnosis not present

## 2016-02-27 DIAGNOSIS — M6281 Muscle weakness (generalized): Secondary | ICD-10-CM | POA: Diagnosis not present

## 2016-02-27 DIAGNOSIS — G20 Parkinson's disease: Secondary | ICD-10-CM | POA: Diagnosis not present

## 2016-02-27 DIAGNOSIS — I5033 Acute on chronic diastolic (congestive) heart failure: Secondary | ICD-10-CM | POA: Diagnosis not present

## 2016-02-27 DIAGNOSIS — R2681 Unsteadiness on feet: Secondary | ICD-10-CM | POA: Diagnosis not present

## 2016-03-02 DIAGNOSIS — L89522 Pressure ulcer of left ankle, stage 2: Secondary | ICD-10-CM | POA: Diagnosis not present

## 2016-03-02 DIAGNOSIS — R2681 Unsteadiness on feet: Secondary | ICD-10-CM | POA: Diagnosis not present

## 2016-03-02 DIAGNOSIS — M6281 Muscle weakness (generalized): Secondary | ICD-10-CM | POA: Diagnosis not present

## 2016-03-02 DIAGNOSIS — I5033 Acute on chronic diastolic (congestive) heart failure: Secondary | ICD-10-CM | POA: Diagnosis not present

## 2016-03-02 DIAGNOSIS — G20 Parkinson's disease: Secondary | ICD-10-CM | POA: Diagnosis not present

## 2016-03-03 ENCOUNTER — Other Ambulatory Visit: Payer: Self-pay | Admitting: Neurology

## 2016-03-04 DIAGNOSIS — I5033 Acute on chronic diastolic (congestive) heart failure: Secondary | ICD-10-CM | POA: Diagnosis not present

## 2016-03-04 DIAGNOSIS — R2681 Unsteadiness on feet: Secondary | ICD-10-CM | POA: Diagnosis not present

## 2016-03-04 DIAGNOSIS — G20 Parkinson's disease: Secondary | ICD-10-CM | POA: Diagnosis not present

## 2016-03-04 DIAGNOSIS — M6281 Muscle weakness (generalized): Secondary | ICD-10-CM | POA: Diagnosis not present

## 2016-03-04 DIAGNOSIS — L89522 Pressure ulcer of left ankle, stage 2: Secondary | ICD-10-CM | POA: Diagnosis not present

## 2016-03-15 ENCOUNTER — Encounter: Payer: Self-pay | Admitting: Cardiology

## 2016-03-15 ENCOUNTER — Ambulatory Visit (INDEPENDENT_AMBULATORY_CARE_PROVIDER_SITE_OTHER): Payer: Medicare Other | Admitting: Cardiology

## 2016-03-15 ENCOUNTER — Encounter: Payer: Self-pay | Admitting: Internal Medicine

## 2016-03-15 ENCOUNTER — Ambulatory Visit (INDEPENDENT_AMBULATORY_CARE_PROVIDER_SITE_OTHER): Payer: Medicare Other | Admitting: Internal Medicine

## 2016-03-15 VITALS — BP 135/86 | HR 65 | Ht 60.0 in | Wt 113.4 lb

## 2016-03-15 VITALS — BP 150/78 | HR 69 | Temp 97.9°F | Wt 113.0 lb

## 2016-03-15 DIAGNOSIS — I10 Essential (primary) hypertension: Secondary | ICD-10-CM | POA: Diagnosis not present

## 2016-03-15 DIAGNOSIS — I509 Heart failure, unspecified: Secondary | ICD-10-CM | POA: Diagnosis not present

## 2016-03-15 DIAGNOSIS — G20 Parkinson's disease: Secondary | ICD-10-CM | POA: Diagnosis not present

## 2016-03-15 DIAGNOSIS — G2581 Restless legs syndrome: Secondary | ICD-10-CM | POA: Diagnosis not present

## 2016-03-15 DIAGNOSIS — G20A1 Parkinson's disease without dyskinesia, without mention of fluctuations: Secondary | ICD-10-CM

## 2016-03-15 DIAGNOSIS — M6281 Muscle weakness (generalized): Secondary | ICD-10-CM

## 2016-03-15 DIAGNOSIS — Z8673 Personal history of transient ischemic attack (TIA), and cerebral infarction without residual deficits: Secondary | ICD-10-CM

## 2016-03-15 DIAGNOSIS — R8290 Unspecified abnormal findings in urine: Secondary | ICD-10-CM | POA: Diagnosis not present

## 2016-03-15 DIAGNOSIS — I951 Orthostatic hypotension: Secondary | ICD-10-CM | POA: Diagnosis not present

## 2016-03-15 LAB — POCT URINALYSIS DIPSTICK
Bilirubin, UA: NEGATIVE
Blood, UA: NEGATIVE
Glucose, UA: NEGATIVE
Ketones, UA: NEGATIVE
Nitrite, UA: POSITIVE
Protein, UA: NEGATIVE
Spec Grav, UA: <=1.005
Urobilinogen, UA: NEGATIVE
pH, UA: 6

## 2016-03-15 MED ORDER — METOPROLOL TARTRATE 50 MG PO TABS: 50.0000 mg | ORAL_TABLET | Freq: Two times a day (BID) | ORAL | 3 refills | Status: DC

## 2016-03-15 NOTE — Progress Notes (Signed)
Location:  [facility] clinic Provider: Elonna Mcfarlane L. Mariea Clonts, D.O., C.M.D.  Code Status: DNR Goals of Care:  Advanced Directives 03/15/2016  Does Patient Have a Medical Advance Directive? Yes  Type of Paramedic of [place];Living will  Does patient want to make changes to medical advance directive? -  Copy of [place] in Chart? Yes  Would patient like information on creating a medical advance directive? -  Pre-existing out of facility DNR order (yellow form or pink MOST form) -     Chief Complaint  Patient presents with  . Acute Visit    foul smelling urine    HPI: Patient is a 83 y.o. female seen today for an acute visit for foul smelling urine, increased lethargy in days (sleeping more in days), slight confusion, and increased urinary frequency (keeping her up at night).  Victoria Lindsey has been with her since last Tuesday morning.  Unclear if confusion is related to "a lot going on" with husband in rehab post hip fx.    She saw Dr. Marlou Porch this am and he suggested she come here today.  He wants to decrease her metoprolol to 50 and stop her plavix.  She will stay on baby asa.  She has been dizzy also since last week.    She cannot move her hips and torso forward with her legs when getting up out of a chair.    Dr. Carles Collet took her off of gabapentin 12/20 for RLS.  She lays in bed and has tremor.  She has suggested using a sinemet 25/100 CR at hs if she had more RLS at night.  Trying to avoid making hallucinations worse.    She wound up having a left-sided breast biopsy that was normal-fluid-filled cyst, fortunately.  It was fibrosis and fibrinous material.  No malignancy.    Past Medical History:  Diagnosis Date  . Anemia   . Breast cancer ([place])    LEFT BREAST  . Dizziness   . Hyperglycemia   . Hyperlipidemia   . Hypertension   . Hypophonia   . Hypothyroidism   . Kidney failure    stage 3  . Left ventricular diastolic dysfunction   . Orthostatic  hypotension   . Parkinson's disease   . Prediabetes   . TIA (transient ischemic attack)   . Tremor     Past Surgical History:  Procedure Laterality Date  . BREAST LUMPECTOMY  2010  . CARDIOVASCULAR STRESS TEST  06/2007   NORMAL  . CATARACT EXTRACTION  2005,2006   Dr.Devonzo  . CHOLECYSTECTOMY  1994  . NASAL SINUS SURGERY     submucous resection late 1950s  . TONSILLECTOMY      Allergies  Allergen Reactions  . Aciphex [Rabeprazole Sodium]     unknown  . Amantadine Other (See Comments)    Pt cannot remember  . Amantadine Hcl     unknown  . Amantadines     unknown  . Avapro [Irbesartan]     unknown  . Cardura [Doxazosin Mesylate]     unknown  . Ciprofloxacin     unknown  . Clonidine Derivatives     unknown  . Cozaar     nightmares  . Diltiazem     unknown  . Doxycycline Hives  . Famotidine     unknown  . Hctz [Hydrochlorothiazide]     unknown  . Indapamide     unknown  . Lansoprazole     unknown  . Lisinopril  Mouth problems  . Loratadine     unknown  . Maxidex [Dexamethasone]     unknown  . Mirapex [Pramipexole Dihydrochloride]     unknown  . Norvasc [Amlodipine Besylate]     unknown  . Pantoprazole Sodium     unknown  . Prilosec [Omeprazole]     unknown  . Procardia [Nifedipine]     unknown  . Proton Pump Inhibitors     unknown  . Sulfa Drugs Cross Reactors     unknown  . Trimethoprim     unknown  . Zyrtec [Cetirizine Hcl]     unknown  . Metronidazole Rash  . Penicillins Rash    Has patient had a PCN reaction causing immediate rash, facial/tongue/throat swelling, SOB or lightheadedness with hypotension: just a rash all over Has patient had a PCN reaction causing severe rash involving mucus membranes or skin necrosis: no Has patient had a PCN reaction that required hospitalization-no Has patient had a PCN reaction occurring within the last 10 years: no, more than 50 yrs ago If all of the above answers are "NO", then may proceed with  Cephalosporin use.     Allergies as of 03/15/2016      Reactions   Aciphex [rabeprazole Sodium]    unknown   Amantadine Other (See Comments)   Pt cannot remember   Amantadine Hcl    unknown   Amantadines    unknown   Avapro [irbesartan]    unknown   Cardura [doxazosin Mesylate]    unknown   Ciprofloxacin    unknown   Clonidine Derivatives    unknown   Cozaar    nightmares   Diltiazem    unknown   Doxycycline Hives   Famotidine    unknown   Hctz [hydrochlorothiazide]    unknown   Indapamide    unknown   Lansoprazole    unknown   Lisinopril    Mouth problems   Loratadine    unknown   Maxidex [dexamethasone]    unknown   Mirapex [pramipexole Dihydrochloride]    unknown   Norvasc [amlodipine Besylate]    unknown   Pantoprazole Sodium    unknown   Prilosec [omeprazole]    unknown   Procardia [nifedipine]    unknown   Proton Pump Inhibitors    unknown   Sulfa Drugs Cross Reactors    unknown   Trimethoprim    unknown   Zyrtec [cetirizine Hcl]    unknown   Metronidazole Rash   Penicillins Rash   Has patient had a PCN reaction causing immediate rash, facial/tongue/throat swelling, SOB or lightheadedness with hypotension: just a rash all over Has patient had a PCN reaction causing severe rash involving mucus membranes or skin necrosis: no Has patient had a PCN reaction that required hospitalization-no Has patient had a PCN reaction occurring within the last 10 years: no, more than 50 yrs ago If all of the above answers are "NO", then may proceed with Cephalosporin use.      Medication List       Accurate as of 03/15/16  3:02 PM. Always use your most recent med list.          ALIGN 4 MG Caps Take by mouth.   aspirin 81 MG tablet Take 81 mg by mouth every evening.   BIOTIN PO Take 1,000 mcg by mouth daily.   carbidopa-levodopa 25-100 MG tablet Commonly known as:  SINEMET IR TAKE ONE TABLET BY MOUTH THREE TIMES DAILY   Cranberry  500 MG  Caps Take 500 mg by mouth daily.   docusate sodium 100 MG capsule Commonly known as:  COLACE Take 100 mg by mouth 2 (two) times daily.   Fish Oil 1200 MG Caps Take 1 capsule by mouth 1 day or 1 dose.   folic acid Q000111Q MCG tablet Commonly known as:  FOLVITE Take 800 mcg by mouth daily.   furosemide 40 MG tablet Commonly known as:  LASIX Take 1 tablet (40 mg total) by mouth daily.   levothyroxine 175 MCG tablet Commonly known as:  SYNTHROID Take one tablet by mouth once daily 30 minutes before breakfast for thyroid.   metoprolol 50 MG tablet Commonly known as:  LOPRESSOR Take 1 tablet (50 mg total) by mouth 2 (two) times daily.   multivitamin with minerals Tabs tablet Take 1 tablet by mouth daily.   POLY-IRON 150 150 MG capsule Generic drug:  iron polysaccharides TAKE ONE CAPSULE BY MOUTH DAILY   rasagiline 1 MG Tabs tablet Commonly known as:  AZILECT TAKE ONE TABLET BY MOUTH EVERY DAY   spironolactone 25 MG tablet Commonly known as:  ALDACTONE TAKE ONE TABLET BY MOUTH DAILY   SYSTANE OP Apply 1 drop to eye 2 (two) times daily as needed (dryness).   Vitamin D3 2000 units Tabs Take 1 tablet by mouth daily.       Review of Systems:  Review of Systems  Constitutional: Positive for malaise/fatigue. Negative for chills and fever.  HENT: Positive for hearing loss.   Eyes:       Glasses  Respiratory: Negative for cough and shortness of breath.   Cardiovascular: Negative for chest pain, palpitations and leg swelling.  Gastrointestinal: Negative for abdominal pain, blood in stool, constipation and melena.  Genitourinary: Positive for dysuria and frequency. Negative for flank pain, hematuria and urgency.        Chronic urgency and some degree of frequency but worse lately  Musculoskeletal: Positive for falls.  Skin: Negative for rash.  Neurological: Positive for dizziness, tingling, tremors, sensory change and weakness. Negative for loss of consciousness and  headaches.       RLS  Endo/Heme/Allergies: Bruises/bleeds easily.  Psychiatric/Behavioral: Positive for memory loss. Negative for depression.    Health Maintenance  Topic Date Due  . URINE MICROALBUMIN  10/11/2015  . PNA vac Low Risk Adult (2 of 2 - PCV13) 10/06/2023 (Originally 05/24/2014)  . OPHTHALMOLOGY EXAM  04/06/2016  . HEMOGLOBIN A1C  04/22/2016  . FOOT EXAM  01/14/2017  . MAMMOGRAM  02/16/2017  . TETANUS/TDAP  02/08/2022  . INFLUENZA VACCINE  Completed  . DEXA SCAN  Completed  . ZOSTAVAX  Completed    Physical Exam: Vitals:   03/15/16 1448  BP: (!) 150/78  Pulse: 69  Temp: 97.9 F (36.6 C)  TempSrc: Oral  SpO2: 95%  Weight: 113 lb (51.3 kg)   Body mass index is 22.07 kg/m. Physical Exam  Constitutional: She is oriented to person, place, and time. No distress.  Cardiovascular:  irreg irreg  Pulmonary/Chest: Effort normal and breath sounds normal. No respiratory distress.  Abdominal: Soft. Bowel sounds are normal.  Musculoskeletal: Normal range of motion.  Walks with rollator walker  Neurological: She is alert and oriented to person, place, and time. She exhibits abnormal muscle tone.  Skin: Skin is warm and dry. There is pallor.  Psychiatric: She has a normal mood and affect.    Labs reviewed: Basic Metabolic Panel:  Recent Labs  04/21/15 0929 06/06/15 0936 06/20/15 1421  10/21/15 0909 10/24/15 1215  NA 139  --  139 141 140  K 4.8  --  4.6 4.4 4.2  CL 96  --  99* 101 102  CO2 25  --  28 29 25   GLUCOSE 98  --  134* 114* 99  BUN 36*  --  35* 44* 45*  CREATININE 1.10*  --  1.04* 1.37* 1.33*  CALCIUM 9.3  --  9.8 9.1 8.6  TSH 9.860* 5.620*  --  2.78  --    Liver Function Tests:  Recent Labs  04/21/15 0929 10/21/15 0909  AST 44* 50*  ALT 36* 30*  ALKPHOS 166* 150*  BILITOT 0.4 0.6  PROT 7.1 6.9  ALBUMIN 3.9 3.8   No results for input(s): LIPASE, AMYLASE in the last 8760 hours. No results for input(s): AMMONIA in the last 8760  hours. CBC:  Recent Labs  03/17/15 04/21/15 0929 06/20/15 1421 07/11/15 1000 10/21/15 0909  WBC 6.4 6.7 6.0 6.1 7.4  NEUTROABS  --  3.7  --  3.4 4,292  HGB 11.5*  --  11.5*  --  12.2  HCT 36 36.9 35.9* 35.4 37.8  MCV  --  96 100.3* 97 99.5  PLT 127* 148* 125* 132* 153   Lipid Panel:  Recent Labs  10/21/15 0909  CHOL 200  HDL 40*  LDLCALC 125  TRIG 175*  CHOLHDL 5.0   Lab Results  Component Value Date   HGBA1C 6.2 (H) 10/21/2015    Assessment/Plan 1. Foul smelling urine - given increased frequency also, we checked UA which appeared positive, culture sent - POC Urinalysis Dipstick - Urine culture -pt and her daughter have been educated about true UTI s/s on multiple occasions and foul urine is not it and suggests her hydration was inadequate so pushing po fluids--recommended again use of a measurable water bottle or pitcher to keep track  2. Parkinson's disease ([place]) -with some hallucinations -agree with trying sinemet CR at hs for #3   3. Restless legs syndrome (RLS) -See #2  4. Proximal muscle weakness -resume PT  Labs/tests ordered:   Orders Placed This Encounter  Procedures  . Urine culture  . POC Urinalysis Dipstick    Next appt:  03/18/2016  Victoria Lindsey L. Miquel Stacks, D.O. [facility] [address], [place] [address] Cell Phone (Mon-Fri 8am-5pm):  [phone] On Call:  [phone] & follow prompts after 5pm & weekends Office Phone:  [phone] Office Fax:  [phone]

## 2016-03-15 NOTE — Patient Instructions (Signed)
Medication Instructions:  Please stop your Plavix and decrease Metoprolol to 50 mg twice a day. Continue all other medications as listed.  Follow-Up: Follow up in 6 months with Dr. Marlou Porch.  You will receive a letter in the mail 2 months before you are due.  Please call us when you receive this letter to schedule your follow up appointment.  If you need a refill on your cardiac medications before your next appointment, please call your pharmacy.  Thank you for choosing [facility]!!

## 2016-03-15 NOTE — Progress Notes (Signed)
Cardiology Office Note    Date:  03/15/2016   ID:  Candelaria, Edleman 01/28/1926, MRN [id number]  PCP:  Hollace Kinnier, DO  Cardiologist:   Candee Furbish, MD     History of Present Illness:  Victoria Lindsey is a 83 y.o. female former patient of Dr. Sherryl Barters here for follow-up. She has a history of possible TIA hospitalization in October 2015. Labile blood pressure. She also has a history of Parkinson's disease and Sjogren's syndrome.  She does not have any known ischemic heart disease and had a normal nuclear stress test in 2009. An echocardiogram showed mild aortic regurgitation and mild mitral regurgitation.  She had possible aspiration in October during her hospitalization while in the MRI scanner. There was concern for aspiration. She was on the ventilator transiently.  No syncope, no chest pain, no orthopnea, no PND. Her voice is weak.She has fallen a few times since her last visit. She is bruising excessively. We decided to stop her Plavix. Denies chest pain. Her daughter is here with her on this visit.  Past Medical History:  Diagnosis Date  . Anemia   . Breast cancer ([place])    LEFT BREAST  . Dizziness   . Hyperglycemia   . Hyperlipidemia   . Hypertension   . Hypophonia   . Hypothyroidism   . Kidney failure    stage 3  . Left ventricular diastolic dysfunction   . Orthostatic hypotension   . Parkinson's disease   . Prediabetes   . TIA (transient ischemic attack)   . Tremor     Past Surgical History:  Procedure Laterality Date  . BREAST LUMPECTOMY  2010  . CARDIOVASCULAR STRESS TEST  06/2007   NORMAL  . CATARACT EXTRACTION  2005,2006   Dr.Devonzo  . CHOLECYSTECTOMY  1994  . NASAL SINUS SURGERY     submucous resection late 1950s  . TONSILLECTOMY      Current Medications: Outpatient Medications Prior to Visit  Medication Sig Dispense Refill  . aspirin 81 MG tablet Take 81 mg by mouth every evening.     Marland Kitchen BIOTIN PO Take 1,000 mcg by mouth daily.     .  carbidopa-levodopa (SINEMET IR) 25-100 MG tablet TAKE ONE TABLET BY MOUTH THREE TIMES DAILY 90 tablet 0  . Cholecalciferol (VITAMIN D3) 2000 UNITS TABS Take 1 tablet by mouth daily.     . Cranberry 500 MG CAPS Take 500 mg by mouth daily.     Marland Kitchen docusate sodium (COLACE) 100 MG capsule Take 100 mg by mouth 2 (two) times daily.     . folic acid (FOLVITE) Q000111Q MCG tablet Take 800 mcg by mouth daily.     . furosemide (LASIX) 40 MG tablet Take 1 tablet (40 mg total) by mouth daily. 90 tablet 2  . levothyroxine (SYNTHROID) 175 MCG tablet Take one tablet by mouth once daily 30 minutes before breakfast for thyroid. 30 tablet 3  . Multiple Vitamin (MULTIVITAMIN WITH MINERALS) TABS tablet Take 1 tablet by mouth daily.    . Omega-3 Fatty Acids (FISH OIL) 1200 MG CAPS Take 1 capsule by mouth 1 day or 1 dose.    Marland Kitchen POLY-IRON 150 150 MG capsule TAKE ONE CAPSULE BY MOUTH DAILY 110 capsule 1  . Polyethyl Glycol-Propyl Glycol (SYSTANE OP) Apply 1 drop to eye 2 (two) times daily as needed (dryness).    . Probiotic Product (ALIGN) 4 MG CAPS Take by mouth.    . rasagiline (AZILECT) 1 MG TABS tablet  TAKE ONE TABLET BY MOUTH EVERY DAY 30 tablet 5  . spironolactone (ALDACTONE) 25 MG tablet TAKE ONE TABLET BY MOUTH DAILY 90 tablet 1  . clopidogrel (PLAVIX) 75 MG tablet TAKE ONE TABLET BY MOUTH EVERY EVENING 90 tablet 1  . metoprolol 75 MG TABS Take 75 mg by mouth 2 (two) times daily. 60 tablet 3  . gabapentin (NEURONTIN) 100 MG capsule Take 100 mg by mouth at bedtime. One additional PRN     No facility-administered medications prior to visit.      Allergies:   Aciphex [rabeprazole sodium]; Amantadine; Amantadine hcl; Amantadines; Avapro [irbesartan]; Cardura [doxazosin mesylate]; Ciprofloxacin; Clonidine derivatives; Cozaar; Diltiazem; Doxycycline; Famotidine; Hctz [hydrochlorothiazide]; Indapamide; Lansoprazole; Lisinopril; Loratadine; Maxidex [dexamethasone]; Mirapex [pramipexole dihydrochloride]; Norvasc [amlodipine  besylate]; Pantoprazole sodium; Prilosec [omeprazole]; Procardia [nifedipine]; Proton pump inhibitors; Sulfa drugs cross reactors; Trimethoprim; Zyrtec [cetirizine hcl]; Metronidazole; and Penicillins   Social History   Social History  . Marital status: Married    Spouse name: N/A  . Number of children: N/A  . Years of education: N/A   Occupational History  . retired     [employer] (but retired for 40+ years)   Social History Main Topics  . Smoking status: Former Smoker    Packs/day: 0.50    Years: 12.00    Types: Cigarettes    Quit date: 03/08/1964  . Smokeless tobacco: Never Used  . Alcohol use No  . Drug use: No  . Sexual activity: No   Other Topics Concern  . Not on file   Social History Narrative   Quit smoking in 1967   Caffeine: Tea and chocolate    Married since 1951   Lives in a house, 1 stories, 2 persons, no pets   Current or past profession- homemaker   Exercise- Yes, Physical Therapy, Occupational Therapy   Living Will-Yes   POA/HPOA-Yes      Family History:  The patient's family history includes COPD in her sister; Cancer in her brother; Diabetes in her father; Heart disease in her mother; Heart failure in her father; Osteoporosis in her daughter; Parkinsonism in her father; Stroke in her sister.   ROS:   Please see the history of present illness.    ROS All other systems reviewed and are negative.   PHYSICAL EXAM:   VS:  BP 135/86   Pulse 65   Ht 5' (1.524 m)   Wt 113 lb 6.4 oz (51.4 kg)   LMP  (LMP Unknown)   BMI 22.15 kg/m    GEN: Thin, in no acute distress Uses a walker for ambulation HEENT: normal  Neck: no JVD, carotid bruits, or masses Cardiac: RRR; no murmurs, rubs, or gallops,no edema  Respiratory:  clear to auscultation bilaterally, normal work of breathing GI: soft, nontender, nondistended, + BS MS: no deformity or atrophy  Skin: warm and dry, no rash, multiple sites of ecchymosis noted. Neuro:  Alert and Oriented x 3, Strength  and sensation are intact Psych: euthymic mood, full affect  Wt Readings from Last 3 Encounters:  03/15/16 113 lb 6.4 oz (51.4 kg)  02/25/16 114 lb (51.7 kg)  01/19/16 113 lb (51.3 kg)      Studies/Labs Reviewed:   EKG:  EKG ordered today 03/15/16-sinus rhythm 65 with left ventricular hypertrophy pattern and repolarization abnormality with deeply inverted T waves in 1, 2, aVL, V5, V6.  Recent Labs: 10/21/2015: ALT 30; Hemoglobin 12.2; Platelets 153; TSH 2.78 10/24/2015: BUN 45; Creat 1.33; Potassium 4.2; Sodium 140  Lipid Panel    Component Value Date/Time   CHOL 200 10/21/2015 0909   CHOL 112 01/08/2015 0806   TRIG 175 (H) 10/21/2015 0909   HDL 40 (L) 10/21/2015 0909   HDL 39 (L) 01/08/2015 0806   CHOLHDL 5.0 10/21/2015 0909   VLDL 35 (H) 10/21/2015 0909   LDLCALC 125 10/21/2015 0909   LDLCALC 55 01/08/2015 0806   LDLDIRECT 65.1 12/27/2011 1001    Additional studies/ records that were reviewed today include:  Prior office notes reviewed, lab work reviewed, testing reviewed.  Echocardiogram 02/13/15-EF Q000111Q, grade 1 diastolic dysfunction, mild aortic insufficiency.  ASSESSMENT:    1. Parkinson's disease ([place])   2. Orthostatic hypotension   3. Acute on chronic congestive heart failure, unspecified congestive heart failure type ([place])   4. Essential hypertension   5. History of TIA (transient ischemic attack)      PLAN:  In order of problems listed above:  History of TIA  - On antiplatelet therapy as above.  - Will stop Plavix.   - Bruising. Excessive.   Essential hypertension  - Currently controlled but has been labile in the past.  - Has battled with orthostatic hypotension as well. Metoprolol was decreased.  - Change metoprolol to 50 mg twice a day. I'm willing to tolerate a higher blood pressure because of her orthostasis. This can go hand in hand with her Parkinson's as well.  Left ventricular diastolic dysfunction  - Hospital stay in December 2016,  responded to IV Lasix in the past.  - She states that she has had dyspnea for years.  - She has chronic spironolactone therapy. Blood work has been closely monitored. Stable.  - Deep T-wave inversions in her knee EKG which are similar to prior EKG back in 2016, multiples reviewed. No active signs of ischemia presently. No chest pain.  Parkinson's  - Sees Dr Tat.  - Memory/confusion has been a little bit worse. She has been sleeping more. She has been staying with her daughter full time since her husband broke his hip and is in a rehabilitation facility.    Medication Adjustments/Labs and Tests Ordered: Current medicines are reviewed at length with the patient today.  Concerns regarding medicines are outlined above.  Medication changes, Labs and Tests ordered today are listed in the Patient Instructions below. Patient Instructions  Medication Instructions:  Please stop your Plavix and decrease Metoprolol to 50 mg twice a day. Continue all other medications as listed.  Follow-Up: Follow up in 6 months with Dr. Marlou Porch.  You will receive a letter in the mail 2 months before you are due.  Please call us when you receive this letter to schedule your follow up appointment.  If you need a refill on your cardiac medications before your next appointment, please call your pharmacy.  Thank you for choosing [facility]!!        Signed, Candee Furbish, MD  03/15/2016 11:22 AM    [facility] [address], [place], [place]  [address] Phone: [phone]; Fax: [phone]

## 2016-03-16 ENCOUNTER — Telehealth: Payer: Self-pay | Admitting: Neurology

## 2016-03-16 ENCOUNTER — Other Ambulatory Visit: Payer: Self-pay | Admitting: Cardiology

## 2016-03-16 MED ORDER — METOPROLOL TARTRATE 50 MG PO TABS: 50.0000 mg | ORAL_TABLET | Freq: Two times a day (BID) | ORAL | 3 refills | Status: DC

## 2016-03-16 MED ORDER — CARBIDOPA-LEVODOPA ER 25-100 MG PO TBCR: 1.0000 | EXTENDED_RELEASE_TABLET | Freq: Every day | ORAL | 2 refills | Status: AC

## 2016-03-16 NOTE — Telephone Encounter (Signed)
agree

## 2016-03-16 NOTE — Telephone Encounter (Signed)
PT's daughter [name] left a message stating that her mom has done better with her restless leg and would like a call back/Dawn CB# [phone]

## 2016-03-16 NOTE — Telephone Encounter (Signed)
Spoke with patient's daughter.  She states her dad broke his hip, so she has been staying with her mom and dad- sleeping in the bed with her mom - and patient's RLS is much improved.  She has not taken any Gabapentin. She only had one night where she seemed to have trouble. She gave her mom a Carbidopa Levodopa 25/100 IR and she was able to get some sleep - but did have trouble with legs 4 hours later.  Per last office note if patient was still having trouble with RLS we could try Carbidopa Levodopa 25/100 CR - one tablet at bedtime. Offered this medication to the daughter. She was like [place] to call this in for the future if she has more trouble. RX sent to [facility] pharmacy and aware I would call daughter back if Dr. Carles Collet did not agree with this plan.

## 2016-03-16 NOTE — Telephone Encounter (Signed)
Patients daughter called and stated that the pharmacy did not receive the rx for the new dose of metoprolol. It was on no print. She is aware that I will re-submit the rx.

## 2016-03-17 LAB — URINE CULTURE: Colony Count: >=100000

## 2016-03-18 ENCOUNTER — Ambulatory Visit: Payer: Medicare Other | Admitting: Internal Medicine

## 2016-03-18 ENCOUNTER — Telehealth: Payer: Self-pay | Admitting: *Deleted

## 2016-03-18 MED ORDER — SACCHAROMYCES BOULARDII 250 MG PO CAPS: 250.0000 mg | ORAL_CAPSULE | Freq: Two times a day (BID) | ORAL | 0 refills | Status: AC

## 2016-03-18 MED ORDER — MAGIC MOUTHWASH W/LIDOCAINE: 5.0000 mL | Freq: Four times a day (QID) | ORAL | 0 refills | Status: AC | PRN

## 2016-03-18 MED ORDER — CEFDINIR 300 MG PO CAPS: 300.0000 mg | ORAL_CAPSULE | Freq: Two times a day (BID) | ORAL | 0 refills | Status: AC

## 2016-03-18 NOTE — Telephone Encounter (Signed)
Magic mouthwash is fine with me.

## 2016-03-18 NOTE — Telephone Encounter (Signed)
Spoke with daughter and advised results. rx sent to pharmacy by e-script  

## 2016-03-18 NOTE — Telephone Encounter (Signed)
Daughter also had a questions about her mother having dry mouth? Per daughter pt complaining about her mouth being "raw" and is requesting magic mouthwash or whatever Dr. Mariea Clonts recommends. Please advise

## 2016-03-18 NOTE — Telephone Encounter (Signed)
-----   Message from [name], DO sent at 03/17/2016  5:33 PM EST ----- Cefdinir 300mg  po q 12 h x 5 days for UTI.  Also eat yogurt daily or take florastor 250mg  po bid to help GI flora.  Notify her daughter [name] and send Rxs to pharmacy.  Of course, push fluids as discussed at appt.

## 2016-03-22 ENCOUNTER — Other Ambulatory Visit: Payer: Self-pay | Admitting: Neurology

## 2016-03-23 ENCOUNTER — Telehealth: Payer: Self-pay | Admitting: Cardiology

## 2016-03-23 MED ORDER — METOPROLOL TARTRATE 50 MG PO TABS: 75.0000 mg | ORAL_TABLET | Freq: Two times a day (BID) | ORAL | 3 refills | Status: DC

## 2016-03-23 NOTE — Telephone Encounter (Signed)
New Message  Pt c/o medication issue:  1. Name of Medication: Metoprolol  2. How are you currently taking this medication (dosage and times per day)? 50mg    3. Are you having a reaction (difficulty breathing--STAT)? High bp, breathing w/o exertion, retaining fluids  4. What is your medication issue? Per pt daughter would like pt dosage to return to 75mg . Please call back to discuss

## 2016-03-23 NOTE — Telephone Encounter (Signed)
Spoke with daughter and made her aware Dr. Marlou Porch ok with going back to Metoprolol 75 bid.  Advised daughter to call back if swelling doesn't improve.  Daughter appreciative for assistance.

## 2016-03-23 NOTE — Telephone Encounter (Signed)
OK with going back to metoprolol 75 bid Candee Furbish, MD

## 2016-03-23 NOTE — Telephone Encounter (Signed)
Spoke with donna, DPR on file. States since decreasing Metoprolol to 50mg  BID pt has slight fluid in ankles, fluid in abdomen, increased BP and SOB whether moving around or sitting still.  BP has been 190's/80's.  Pt recently diagnosed with UTI, taking last dose of abt today.  Daughter unsure if UTI may possibly have anything to do with this.  Pt and daughter concerned about CHF and would like to go back to Metoprolol Tartrate 75mg  BID if Dr. Marlou Porch is agreeable.  Advised I would send message to Dr. Marlou Porch for review.

## 2016-03-25 ENCOUNTER — Telehealth: Payer: Self-pay | Admitting: Physician Assistant

## 2016-03-25 ENCOUNTER — Encounter (HOSPITAL_COMMUNITY): Payer: Self-pay

## 2016-03-25 ENCOUNTER — Observation Stay (HOSPITAL_COMMUNITY): Payer: Medicare Other

## 2016-03-25 ENCOUNTER — Emergency Department (HOSPITAL_COMMUNITY): Payer: Medicare Other

## 2016-03-25 ENCOUNTER — Inpatient Hospital Stay (HOSPITAL_COMMUNITY)
Admission: EM | Admit: 2016-03-25 | Discharge: 2016-03-29 | DRG: 683 | Disposition: A | Discharge status: Skilled Nursing Facility | Payer: Medicare Other | Attending: Family Medicine | Admitting: Family Medicine

## 2016-03-25 DIAGNOSIS — E1122 Type 2 diabetes mellitus with diabetic chronic kidney disease: Secondary | ICD-10-CM | POA: Diagnosis not present

## 2016-03-25 DIAGNOSIS — Z79899 Other long term (current) drug therapy: Secondary | ICD-10-CM | POA: Diagnosis not present

## 2016-03-25 DIAGNOSIS — N183 Chronic kidney disease, stage 3 unspecified: Secondary | ICD-10-CM | POA: Diagnosis present

## 2016-03-25 DIAGNOSIS — I5032 Chronic diastolic (congestive) heart failure: Secondary | ICD-10-CM | POA: Diagnosis present

## 2016-03-25 DIAGNOSIS — Z8673 Personal history of transient ischemic attack (TIA), and cerebral infarction without residual deficits: Secondary | ICD-10-CM | POA: Diagnosis not present

## 2016-03-25 DIAGNOSIS — R0682 Tachypnea, not elsewhere classified: Secondary | ICD-10-CM | POA: Diagnosis not present

## 2016-03-25 DIAGNOSIS — Z87891 Personal history of nicotine dependence: Secondary | ICD-10-CM | POA: Diagnosis not present

## 2016-03-25 DIAGNOSIS — Z7982 Long term (current) use of aspirin: Secondary | ICD-10-CM | POA: Diagnosis not present

## 2016-03-25 DIAGNOSIS — I130 Hypertensive heart and chronic kidney disease with heart failure and stage 1 through stage 4 chronic kidney disease, or unspecified chronic kidney disease: Secondary | ICD-10-CM | POA: Diagnosis present

## 2016-03-25 DIAGNOSIS — D631 Anemia in chronic kidney disease: Secondary | ICD-10-CM | POA: Diagnosis not present

## 2016-03-25 DIAGNOSIS — J810 Acute pulmonary edema: Secondary | ICD-10-CM | POA: Diagnosis not present

## 2016-03-25 DIAGNOSIS — R109 Unspecified abdominal pain: Secondary | ICD-10-CM

## 2016-03-25 DIAGNOSIS — Z8249 Family history of ischemic heart disease and other diseases of the circulatory system: Secondary | ICD-10-CM | POA: Diagnosis not present

## 2016-03-25 DIAGNOSIS — R339 Retention of urine, unspecified: Secondary | ICD-10-CM

## 2016-03-25 DIAGNOSIS — D649 Anemia, unspecified: Secondary | ICD-10-CM | POA: Diagnosis present

## 2016-03-25 DIAGNOSIS — Z880 Allergy status to penicillin: Secondary | ICD-10-CM

## 2016-03-25 DIAGNOSIS — Z853 Personal history of malignant neoplasm of breast: Secondary | ICD-10-CM | POA: Diagnosis not present

## 2016-03-25 DIAGNOSIS — Z888 Allergy status to other drugs, medicaments and biological substances status: Secondary | ICD-10-CM

## 2016-03-25 DIAGNOSIS — N179 Acute kidney failure, unspecified: Secondary | ICD-10-CM | POA: Diagnosis not present

## 2016-03-25 DIAGNOSIS — J811 Chronic pulmonary edema: Secondary | ICD-10-CM | POA: Diagnosis not present

## 2016-03-25 DIAGNOSIS — N390 Urinary tract infection, site not specified: Secondary | ICD-10-CM | POA: Diagnosis present

## 2016-03-25 DIAGNOSIS — G20 Parkinson's disease: Secondary | ICD-10-CM | POA: Diagnosis not present

## 2016-03-25 DIAGNOSIS — E119 Type 2 diabetes mellitus without complications: Secondary | ICD-10-CM

## 2016-03-25 DIAGNOSIS — Z882 Allergy status to sulfonamides status: Secondary | ICD-10-CM | POA: Diagnosis not present

## 2016-03-25 DIAGNOSIS — R062 Wheezing: Secondary | ICD-10-CM | POA: Diagnosis not present

## 2016-03-25 DIAGNOSIS — I161 Hypertensive emergency: Secondary | ICD-10-CM | POA: Diagnosis present

## 2016-03-25 DIAGNOSIS — I5189 Other ill-defined heart diseases: Secondary | ICD-10-CM | POA: Diagnosis present

## 2016-03-25 DIAGNOSIS — I519 Heart disease, unspecified: Secondary | ICD-10-CM | POA: Diagnosis present

## 2016-03-25 DIAGNOSIS — E785 Hyperlipidemia, unspecified: Secondary | ICD-10-CM | POA: Diagnosis not present

## 2016-03-25 DIAGNOSIS — E039 Hypothyroidism, unspecified: Secondary | ICD-10-CM | POA: Diagnosis not present

## 2016-03-25 DIAGNOSIS — G459 Transient cerebral ischemic attack, unspecified: Secondary | ICD-10-CM | POA: Diagnosis present

## 2016-03-25 LAB — URINALYSIS, ROUTINE W REFLEX MICROSCOPIC
Bilirubin Urine: NEGATIVE
Glucose, UA: NEGATIVE mg/dL
Hgb urine dipstick: NEGATIVE
Ketones, ur: NEGATIVE mg/dL
Nitrite: NEGATIVE
PH: 6 (ref 5.0–8.0)
Protein, ur: 30 mg/dL — AB
Specific Gravity, Urine: 1.009 (ref 1.005–1.030)

## 2016-03-25 LAB — CBC AND DIFFERENTIAL
HCT: 34 % — AB (ref 36–46)
HEMATOCRIT: 34 % — AB (ref 36–46)
HEMATOCRIT: 34 % — AB (ref 36–46)
HEMOGLOBIN: 10.7 g/dL — AB (ref 12.0–16.0)
HEMOGLOBIN: 10.9 g/dL — AB (ref 12.0–16.0)
Hemoglobin: 11.6 g/dL — AB (ref 12.0–16.0)
Neutrophils Absolute: 6 /uL
PLATELETS: 139 10*3/uL — AB (ref 150–399)
Platelets: 143 10*3/uL — AB (ref 150–399)
WBC: 9 10^3/mL
WBC: 9.4 10^3/mL

## 2016-03-25 LAB — CBC
HCT: 34.2 % — ABNORMAL LOW (ref 36.0–46.0)
Hemoglobin: 10.9 g/dL — ABNORMAL LOW (ref 12.0–15.0)
MCH: 33.7 pg (ref 26.0–34.0)
MCHC: 31.9 g/dL (ref 30.0–36.0)
MCV: 105.9 fL — ABNORMAL HIGH (ref 78.0–100.0)
PLATELETS: 139 10*3/uL — AB (ref 150–400)
RBC: 3.23 MIL/uL — ABNORMAL LOW (ref 3.87–5.11)
RDW: 17.8 % — AB (ref 11.5–15.5)
WBC: 9.4 10*3/uL (ref 4.0–10.5)

## 2016-03-25 LAB — HEPATIC FUNCTION PANEL: BILIRUBIN, TOTAL: 0.5 mg/dL

## 2016-03-25 LAB — I-STAT CHEM 8, ED
BUN: 30 mg/dL — ABNORMAL HIGH (ref 6–20)
CALCIUM ION: 1.26 mmol/L (ref 1.15–1.40)
CREATININE: 1.2 mg/dL — AB (ref 0.44–1.00)
Chloride: 96 mmol/L — ABNORMAL LOW (ref 101–111)
GLUCOSE: 93 mg/dL (ref 65–99)
HCT: 34 % — ABNORMAL LOW (ref 36.0–46.0)
HEMOGLOBIN: 11.6 g/dL — AB (ref 12.0–15.0)
Potassium: 4.3 mmol/L (ref 3.5–5.1)
Sodium: 135 mmol/L (ref 135–145)
TCO2: 31 mmol/L (ref 0–100)

## 2016-03-25 LAB — CBC WITH DIFFERENTIAL/PLATELET
BASOS PCT: 1 %
Basophils Absolute: 0.1 10*3/uL (ref 0.0–0.1)
EOS PCT: 2 %
Eosinophils Absolute: 0.2 10*3/uL (ref 0.0–0.7)
HEMATOCRIT: 34.1 % — AB (ref 36.0–46.0)
Hemoglobin: 10.7 g/dL — ABNORMAL LOW (ref 12.0–15.0)
LYMPHS PCT: 24 %
Lymphs Abs: 2.2 10*3/uL (ref 0.7–4.0)
MCH: 33.4 pg (ref 26.0–34.0)
MCHC: 31.4 g/dL (ref 30.0–36.0)
MCV: 106.6 fL — AB (ref 78.0–100.0)
MONO ABS: 1 10*3/uL (ref 0.1–1.0)
Monocytes Relative: 11 %
NEUTROS PCT: 62 %
Neutro Abs: 5.5 10*3/uL (ref 1.7–7.7)
PLATELETS: 143 10*3/uL — AB (ref 150–400)
RBC: 3.2 MIL/uL — AB (ref 3.87–5.11)
RDW: 17.7 % — AB (ref 11.5–15.5)
WBC: 9 10*3/uL (ref 4.0–10.5)

## 2016-03-25 LAB — CREATININE, SERUM
CREATININE: 1.19 mg/dL — AB (ref 0.44–1.00)
GFR calc Af Amer: 46 mL/min — ABNORMAL LOW (ref >60)
GFR calc non Af Amer: 40 mL/min — ABNORMAL LOW (ref >60)

## 2016-03-25 LAB — COMPREHENSIVE METABOLIC PANEL
ALT: 9 U/L — ABNORMAL LOW (ref 14–54)
ANION GAP: 9 (ref 5–15)
AST: 54 U/L — ABNORMAL HIGH (ref 15–41)
Albumin: 3.4 g/dL — ABNORMAL LOW (ref 3.5–5.0)
Alkaline Phosphatase: 165 U/L — ABNORMAL HIGH (ref 38–126)
BUN: 27 mg/dL — ABNORMAL HIGH (ref 6–20)
CHLORIDE: 98 mmol/L — AB (ref 101–111)
CO2: 28 mmol/L (ref 22–32)
Calcium: 9.9 mg/dL (ref 8.9–10.3)
Creatinine, Ser: 1.21 mg/dL — ABNORMAL HIGH (ref 0.44–1.00)
GFR, EST AFRICAN AMERICAN: 45 mL/min — AB (ref >60)
GFR, EST NON AFRICAN AMERICAN: 39 mL/min — AB (ref >60)
Glucose, Bld: 96 mg/dL (ref 65–99)
POTASSIUM: 4.4 mmol/L (ref 3.5–5.1)
SODIUM: 135 mmol/L (ref 135–145)
Total Bilirubin: 0.5 mg/dL (ref 0.3–1.2)
Total Protein: 7.2 g/dL (ref 6.5–8.1)

## 2016-03-25 LAB — GLUCOSE, CAPILLARY: Glucose-Capillary: 174 mg/dL — ABNORMAL HIGH (ref 65–99)

## 2016-03-25 LAB — BASIC METABOLIC PANEL
BUN: 27 mg/dL — AB (ref 4–21)
BUN: 30 mg/dL — AB (ref 4–21)
CREATININE: 1.2 mg/dL — AB (ref 0.5–1.1)
Creatinine: 1.2 mg/dL — AB (ref 0.5–1.1)
Creatinine: 1.2 mg/dL — AB (ref 0.5–1.1)
Glucose: 93 mg/dL
Glucose: 96 mg/dL
POTASSIUM: 4.4 mmol/L (ref 3.4–5.3)
Potassium: 4.3 mmol/L (ref 3.4–5.3)
SODIUM: 135 mmol/L — AB (ref 137–147)
Sodium: 135 mmol/L — AB (ref 137–147)

## 2016-03-25 LAB — INFLUENZA PANEL BY PCR (TYPE A & B)
INFLAPCR: NEGATIVE
INFLBPCR: NEGATIVE

## 2016-03-25 LAB — TROPONIN I
TROPONIN I: 0.04 ng/mL — AB (ref <0.03)
Troponin I: 0.04 ng/mL (ref <0.03)

## 2016-03-25 LAB — BRAIN NATRIURETIC PEPTIDE: B NATRIURETIC PEPTIDE 5: 1464 pg/mL — AB (ref 0.0–100.0)

## 2016-03-25 LAB — I-STAT TROPONIN, ED: Troponin i, poc: 0.02 ng/mL (ref 0.00–0.08)

## 2016-03-25 LAB — MAGNESIUM: Magnesium: 2 mg/dL (ref 1.7–2.4)

## 2016-03-25 LAB — PHOSPHORUS: PHOSPHORUS: 4.1 mg/dL (ref 2.5–4.6)

## 2016-03-25 MED ORDER — INSULIN ASPART 100 UNIT/ML ~~LOC~~ SOLN
0.0000 [IU] | Freq: Three times a day (TID) | SUBCUTANEOUS | Status: DC
  Administered 2016-03-26: 3 [IU] via SUBCUTANEOUS
  Administered 2016-03-26 – 2016-03-27 (×2): 2 [IU] via SUBCUTANEOUS

## 2016-03-25 MED ORDER — RASAGILINE MESYLATE 1 MG PO TABS
1.0000 mg | ORAL_TABLET | Freq: Every day | ORAL | Status: DC
  Administered 2016-03-26 – 2016-03-29 (×4): 1 mg via ORAL
  Filled 2016-03-25 (×4): qty 1

## 2016-03-25 MED ORDER — METOPROLOL TARTRATE 50 MG PO TABS
75.0000 mg | ORAL_TABLET | Freq: Two times a day (BID) | ORAL | Status: DC
  Administered 2016-03-25 – 2016-03-29 (×8): 75 mg via ORAL
  Filled 2016-03-25 (×9): qty 1

## 2016-03-25 MED ORDER — CARBIDOPA-LEVODOPA ER 25-100 MG PO TBCR
1.0000 | EXTENDED_RELEASE_TABLET | Freq: Every day | ORAL | Status: DC
  Administered 2016-03-25 – 2016-03-28 (×4): 1 via ORAL
  Filled 2016-03-25 (×4): qty 1

## 2016-03-25 MED ORDER — FUROSEMIDE 10 MG/ML IJ SOLN
40.0000 mg | INTRAMUSCULAR | Status: AC
  Administered 2016-03-25: 40 mg via INTRAVENOUS
  Filled 2016-03-25: qty 4

## 2016-03-25 MED ORDER — ENOXAPARIN SODIUM 30 MG/0.3ML ~~LOC~~ SOLN
30.0000 mg | SUBCUTANEOUS | Status: DC
  Administered 2016-03-26 – 2016-03-29 (×4): 30 mg via SUBCUTANEOUS
  Filled 2016-03-25 (×4): qty 0.3

## 2016-03-25 MED ORDER — LEVOTHYROXINE SODIUM 175 MCG PO TABS
175.0000 ug | ORAL_TABLET | Freq: Every day | ORAL | Status: DC
  Administered 2016-03-26 – 2016-03-29 (×4): 175 ug via ORAL
  Filled 2016-03-25 (×4): qty 1

## 2016-03-25 MED ORDER — FUROSEMIDE 40 MG PO TABS
40.0000 mg | ORAL_TABLET | Freq: Every day | ORAL | Status: DC
  Administered 2016-03-26 – 2016-03-27 (×2): 40 mg via ORAL
  Filled 2016-03-25 (×2): qty 1

## 2016-03-25 MED ORDER — ACETAMINOPHEN 325 MG PO TABS
650.0000 mg | ORAL_TABLET | Freq: Four times a day (QID) | ORAL | Status: DC | PRN
  Administered 2016-03-27: 650 mg via ORAL
  Filled 2016-03-25: qty 2

## 2016-03-25 MED ORDER — CARBIDOPA-LEVODOPA 25-100 MG PO TABS
1.0000 | ORAL_TABLET | Freq: Three times a day (TID) | ORAL | Status: DC
  Administered 2016-03-25 – 2016-03-29 (×12): 1 via ORAL
  Filled 2016-03-25 (×13): qty 1

## 2016-03-25 MED ORDER — HYDRALAZINE HCL 20 MG/ML IJ SOLN
10.0000 mg | Freq: Three times a day (TID) | INTRAMUSCULAR | Status: DC | PRN
  Administered 2016-03-26: 10 mg via INTRAVENOUS
  Filled 2016-03-25: qty 1

## 2016-03-25 MED ORDER — FUROSEMIDE 10 MG/ML IJ SOLN
40.0000 mg | Freq: Once | INTRAMUSCULAR | Status: AC
  Administered 2016-03-25: 40 mg via INTRAVENOUS
  Filled 2016-03-25: qty 4

## 2016-03-25 MED ORDER — ACETAMINOPHEN 650 MG RE SUPP: 650.0000 mg | Freq: Four times a day (QID) | RECTAL | Status: DC | PRN

## 2016-03-25 MED ORDER — ONDANSETRON HCL 4 MG/2ML IJ SOLN: 4.0000 mg | Freq: Four times a day (QID) | INTRAMUSCULAR | Status: DC | PRN

## 2016-03-25 MED ORDER — NITROGLYCERIN 2 % TD OINT
0.5000 [in_us] | TOPICAL_OINTMENT | Freq: Once | TRANSDERMAL | Status: AC
  Administered 2016-03-25: 0.5 [in_us] via TOPICAL
  Filled 2016-03-25: qty 1

## 2016-03-25 MED ORDER — SPIRONOLACTONE 25 MG PO TABS
25.0000 mg | ORAL_TABLET | Freq: Every day | ORAL | Status: DC
  Administered 2016-03-26 – 2016-03-27 (×2): 25 mg via ORAL
  Filled 2016-03-25 (×2): qty 1

## 2016-03-25 MED ORDER — SACCHAROMYCES BOULARDII 250 MG PO CAPS
250.0000 mg | ORAL_CAPSULE | Freq: Two times a day (BID) | ORAL | Status: DC
  Administered 2016-03-25 – 2016-03-29 (×8): 250 mg via ORAL
  Filled 2016-03-25 (×8): qty 1

## 2016-03-25 MED ORDER — ONDANSETRON HCL 4 MG PO TABS: 4.0000 mg | ORAL_TABLET | Freq: Four times a day (QID) | ORAL | Status: DC | PRN

## 2016-03-25 MED ORDER — SODIUM CHLORIDE 0.9% FLUSH
3.0000 mL | Freq: Two times a day (BID) | INTRAVENOUS | Status: DC
  Administered 2016-03-25 – 2016-03-29 (×8): 3 mL via INTRAVENOUS

## 2016-03-25 MED ORDER — DEXTROSE 5 % IV SOLN
1.0000 g | INTRAVENOUS | Status: DC
  Administered 2016-03-25 – 2016-03-27 (×3): 1 g via INTRAVENOUS
  Filled 2016-03-25 (×3): qty 10

## 2016-03-25 MED ORDER — DOCUSATE SODIUM 100 MG PO CAPS
100.0000 mg | ORAL_CAPSULE | Freq: Two times a day (BID) | ORAL | Status: DC
  Administered 2016-03-25 – 2016-03-29 (×8): 100 mg via ORAL
  Filled 2016-03-25 (×8): qty 1

## 2016-03-25 MED ORDER — ASPIRIN EC 81 MG PO TBEC
81.0000 mg | DELAYED_RELEASE_TABLET | Freq: Every evening | ORAL | Status: DC
  Administered 2016-03-25 – 2016-03-28 (×4): 81 mg via ORAL
  Filled 2016-03-25 (×4): qty 1

## 2016-03-25 NOTE — ED Triage Notes (Signed)
Pt. Coming from home via [facility] for shortness of breath starting this morning. Pt. Placed on 4L nasal cannula by fire with no pulse oximetry reading. Pt. sts she has a cold, but no other complaints. Pt. Noted to have upper airway wheezing, but lung sounds clear. Pt. Hx of parkinsons, CHF, and recent UTI. Pt. Noted to have pulse oximetry reading of 93% on room air once placed in ED room.

## 2016-03-25 NOTE — ED Provider Notes (Signed)
[facility] DEPT Provider Note   CSN: [id number] Arrival date & time: 03/25/16  1126     History   Chief Complaint Chief Complaint  Patient presents with  . Shortness of Breath    HPI Victoria Lindsey is a 83 y.o. female Past medical history of Parkinson's, orthostatic hypotension, diastolic heart failure, hypertension. who presents via EMS for shortness of breath. According to the patient she began having worsening shortness of breath last night. Her daughter states that she has a chronic runny nose but has not had any cough. It had some medication adjustment issues and the patient has had significant hypertension over the past few days.. Review of the EMR shows that the patient's daughter called her cardiologist due to her having blood pressures over 200, having labored breathing despite her medications. Patient complains of cough, runny nose and states "my breath has been going in and out." She denies a history of oxygen dependence. She complains of associated fatigue . She is unsure of what medication she takes. She denies any urinary symptoms, abdominal pain, nausea, vomiting, chest pain. Her daughter states that she has noticed she has been having a lot of difficulty urinating lately. She was treated for an asymptomatic bacterial urinary infection last week and stopped taking the Ceftin here this past Monday as she finished the course. Her daughter also states she's had a history of pulmonary edema about 1 year ago.  HPI  Past Medical History:  Diagnosis Date  . Anemia   . Breast cancer ([place])    LEFT BREAST  . Dizziness   . Hyperglycemia   . Hyperlipidemia   . Hypertension   . Hypophonia   . Hypothyroidism   . Kidney failure    stage 3  . Left ventricular diastolic dysfunction   . Orthostatic hypotension   . Parkinson's disease   . Prediabetes   . TIA (transient ischemic attack)   . Tremor     Patient Active Problem List   Diagnosis Date Noted  . Hypertensive  emergency 03/25/2016  . Orthostatic hypotension 12/29/2015  . Compression fracture of lumbar vertebra ([place]) 08/04/2015  . Avitaminosis D 08/04/2015  . Venous insufficiency 07/11/2015  . Pressure sore on ankle 07/11/2015  . Falls frequently 07/11/2015  . Pain in lower limb 04/17/2015  . Chronic constipation 03/24/2015  . Hypothyroidism due to acquired atrophy of thyroid 03/24/2015  . Herpes labialis 02/23/2015  . Dysphagia 02/13/2015  . Diarrhea 02/12/2015  . Onychomycosis 01/17/2015  . Folliculitis [id number]  . Encephalopathy, hypertensive 05/29/2014  . Dizziness of unknown cause 02/21/2014  . Left ventricular diastolic dysfunction with preserved systolic function ([place]) [id number]  . Hypertensive heart disease with CHF (congestive heart failure) ([place]) 12/30/2013  . Transaminitis 12/24/2013  . Palpitations 12/23/2013  . Respiratory insufficiency 12/22/2013  . Normocytic anemia 12/21/2013  . Thrombocytopenia ([place]) 12/21/2013  . Abnormal ECG 12/15/2013  . Abnormal involuntary movement 12/15/2013  . Colorectal polyps 12/15/2013  . Chest pain 12/15/2013  . Arteriosclerosis of coronary artery 12/15/2013  . Chronic kidney disease (CKD), stage III (moderate) 12/15/2013  . Corn 12/15/2013  . Diabetes ([place]) 12/15/2013  . Breathing difficult 12/15/2013  . Acid reflux 12/15/2013  . HLD (hyperlipidemia) 12/15/2013  . BP (high blood pressure) 12/15/2013  . FOM (frequency of micturition) 12/15/2013  . Breast cancer, female ([place]) 12/15/2013  . Fungal infection of nail 12/15/2013  . Osteopenia 12/15/2013  . Raynaud's syndrome 12/15/2013  . Restless leg 12/15/2013  . Gougerout-Sjoegren syndrome ([place]) 12/15/2013  .  Change in blood platelet count 12/15/2013  . Infection of urinary tract 12/15/2013  . Paroxysmal digital cyanosis 12/15/2013  . Lower urinary tract infection 10/12/2013  . Idiopathic Parkinson's disease ([place]) 09/06/2013  . Bladder spasm 09/05/2013  . Urgency of micturation  09/05/2013  . Acute on chronic diastolic (congestive) heart failure 06/15/2012  . Hyperglycemia 12/27/2011  . Sjogren's syndrome ([place]) 12/17/2010  . TIA (transient ischemic attack) 12/17/2010  . Benign hypertensive heart disease without heart failure 09/01/2010  . Parkinson's disease ([place]) 09/01/2010  . Hypothyroidism 09/01/2010  . Hypercholesterolemia 09/01/2010  . Chronic anemia 09/01/2010    Past Surgical History:  Procedure Laterality Date  . BREAST LUMPECTOMY  2010  . CARDIOVASCULAR STRESS TEST  06/2007   NORMAL  . CATARACT EXTRACTION  2005,2006   Dr.Devonzo  . CHOLECYSTECTOMY  1994  . NASAL SINUS SURGERY     submucous resection late 1950s  . TONSILLECTOMY      OB History    No data available       Home Medications    Prior to Admission medications   Medication Sig Start Date End Date Taking? Authorizing Provider  aspirin 81 MG tablet Take 81 mg by mouth every evening.    Yes Historical Provider, MD  BIOTIN Lindsey Take 1,000 mcg by mouth daily.    Yes Historical Provider, MD  carbidopa-levodopa (SINEMET IR) 25-100 MG tablet TAKE ONE TABLET BY MOUTH THREE TIMES DAILY 03/22/16  Yes Rebecca S Tat, DO  Carbidopa-Levodopa ER (SINEMET CR) 25-100 MG tablet controlled release Take 1 tablet by mouth at bedtime. 03/16/16  Yes Rebecca S Tat, DO  Cholecalciferol (VITAMIN D3) 2000 UNITS TABS Take 2,000 Units by mouth daily.    Yes Historical Provider, MD  Cranberry 500 MG CAPS Take 500 mg by mouth daily.    Yes Historical Provider, MD  docusate sodium (COLACE) 100 MG capsule Take 100 mg by mouth 2 (two) times daily.    Yes Historical Provider, MD  folic acid (FOLVITE) [id number] MCG tablet Take 800 mcg by mouth daily.    Yes Historical Provider, MD  furosemide (LASIX) 40 MG tablet Take 1 tablet (40 mg total) by mouth daily. 11/05/15  Yes Jerline Pain, MD  levothyroxine (SYNTHROID) 175 MCG tablet Take one tablet by mouth once daily 30 minutes before breakfast for thyroid. Patient taking  differently: Take 175 mcg by mouth daily before breakfast. BRAND NAME ONLY Take one tablet by mouth once daily 30 minutes before breakfast for thyroid. 01/12/16  Yes Tiffany L Reed, DO  magic mouthwash w/lidocaine SOLN Take 5 mLs by mouth 4 (four) times daily as needed for mouth pain. 03/18/16  Yes Tiffany L Reed, DO  metoprolol (LOPRESSOR) 50 MG tablet Take 1.5 tablets (75 mg total) by mouth 2 (two) times daily. 03/23/16  Yes Jerline Pain, MD  Multiple Vitamin (MULTIVITAMIN WITH MINERALS) TABS tablet Take 1 tablet by mouth daily.   Yes Historical Provider, MD  Omega-3 Fatty Acids (FISH OIL) 1200 MG CAPS Take 1 capsule by mouth every evening.    Yes Historical Provider, MD  POLY-IRON 150 150 MG capsule TAKE ONE CAPSULE BY MOUTH DAILY 02/23/16  Yes Tiffany L Reed, DO  Polyethyl Glycol-Propyl Glycol (SYSTANE OP) Apply 1 drop to eye 2 (two) times daily as needed (dryness).   Yes Historical Provider, MD  rasagiline (AZILECT) 1 MG TABS tablet TAKE ONE TABLET BY MOUTH EVERY DAY 03/03/16  Yes Rebecca S Tat, DO  saccharomyces boulardii (FLORASTOR) 250 MG capsule Take  250 mg by mouth 2 (two) times daily.   Yes Historical Provider, MD  spironolactone (ALDACTONE) 25 MG tablet TAKE ONE TABLET BY MOUTH DAILY 01/27/16  Yes Jerline Pain, MD    Family History Family History  Problem Relation Age of Onset  . Heart disease Mother   . Heart failure Father   . Parkinsonism Father   . Diabetes Father   . COPD Sister   . Cancer Brother   . Osteoporosis Daughter   . Stroke Sister     Social History Social History  Substance Use Topics  . Smoking status: Former Smoker    Packs/day: 0.50    Years: 12.00    Types: Cigarettes    Quit date: 03/08/1964  . Smokeless tobacco: Never Used  . Alcohol use No     Allergies   Aciphex [rabeprazole sodium]; Amantadines; Avapro [irbesartan]; Cardura [doxazosin mesylate]; Ciprofloxacin; Clonidine derivatives; Cozaar; Diltiazem; Doxycycline; Famotidine; Hctz  [hydrochlorothiazide]; Indapamide; Lansoprazole; Lisinopril; Loratadine; Maxidex [dexamethasone]; Mirapex [pramipexole dihydrochloride]; Norvasc [amlodipine besylate]; Pantoprazole sodium; Prilosec [omeprazole]; Procardia [nifedipine]; Proton pump inhibitors; Sulfa drugs cross reactors; Trimethoprim; Zyrtec [cetirizine hcl]; Metronidazole; and Penicillins   Review of Systems Review of Systems  Ten systems reviewed and are negative for acute change, except as noted in the HPI.   Physical Exam Updated Vital Signs BP (!) 111/52 (BP Location: Right Arm)   Pulse 79   Temp 97.5 F (36.4 C) (Axillary)   Resp (!) 22   Ht 5' (1.524 m)   Wt 48.1 kg   LMP  (LMP Unknown)   SpO2 95%   BMI 20.70 kg/m   Physical Exam  Constitutional: She is oriented to person, place, and time. She appears well-developed and well-nourished. No distress.  Elderly female in no acute distress  HENT:  Head: Normocephalic and atraumatic.  Eyes: Conjunctivae and EOM are normal. Pupils are equal, round, and reactive to light. No scleral icterus.  Neck: Normal range of motion.  Cardiovascular: Normal rate, regular rhythm and normal heart sounds.  Exam reveals no gallop and no friction rub.   No murmur heard. Pulmonary/Chest: No respiratory distress. She has wheezes.  Poor air movement.  Abdominal: Soft. Bowel sounds are normal. She exhibits no distension. There is no tenderness.  Abdomen firm, up to the umbilicus.  Neurological: She is alert and oriented to person, place, and time.  Skin: Skin is warm and dry. She is not diaphoretic.  Psychiatric:  Very cold hands and feet. History of Raynaud's  Nursing note and vitals reviewed.    ED Treatments / Results  Labs (all labs ordered are listed, but only abnormal results are displayed) Labs Reviewed  COMPREHENSIVE METABOLIC PANEL - Abnormal; Notable for the following:       Result Value   Chloride 98 (*)    BUN 27 (*)    Creatinine, Ser 1.21 (*)    Albumin 3.4  (*)    AST 54 (*)    ALT 9 (*)    Alkaline Phosphatase 165 (*)    GFR calc non Af Amer 39 (*)    GFR calc Af Amer 45 (*)    All other components within normal limits  CBC WITH DIFFERENTIAL/PLATELET - Abnormal; Notable for the following:    RBC 3.20 (*)    Hemoglobin 10.7 (*)    HCT 34.1 (*)    MCV 106.6 (*)    RDW 17.7 (*)    Platelets 143 (*)    All other components within normal limits  BRAIN NATRIURETIC PEPTIDE - Abnormal; Notable for the following:    B Natriuretic Peptide 1,464.0 (*)    All other components within normal limits  URINALYSIS, ROUTINE W REFLEX MICROSCOPIC - Abnormal; Notable for the following:    APPearance HAZY (*)    Protein, ur 30 (*)    Leukocytes, UA SMALL (*)    Bacteria, UA RARE (*)    Squamous Epithelial / LPF 0-5 (*)    Non Squamous Epithelial 0-5 (*)    All other components within normal limits  TROPONIN I - Abnormal; Notable for the following:    Troponin I 0.04 (*)    All other components within normal limits  TROPONIN I - Abnormal; Notable for the following:    Troponin I 0.04 (*)    All other components within normal limits  CBC - Abnormal; Notable for the following:    RBC 3.23 (*)    Hemoglobin 10.9 (*)    HCT 34.2 (*)    MCV 105.9 (*)    RDW 17.8 (*)    Platelets 139 (*)    All other components within normal limits  CREATININE, SERUM - Abnormal; Notable for the following:    Creatinine, Ser 1.19 (*)    GFR calc non Af Amer 40 (*)    GFR calc Af Amer 46 (*)    All other components within normal limits  TROPONIN I - Abnormal; Notable for the following:    Troponin I 0.05 (*)    All other components within normal limits  BASIC METABOLIC PANEL - Abnormal; Notable for the following:    Chloride 94 (*)    Glucose, Bld 106 (*)    BUN 30 (*)    Creatinine, Ser 1.28 (*)    GFR calc non Af Amer 36 (*)    GFR calc Af Amer 42 (*)    All other components within normal limits  CBC - Abnormal; Notable for the following:    RBC 3.30 (*)     Hemoglobin 11.4 (*)    HCT 34.7 (*)    MCV 105.2 (*)    MCH 34.5 (*)    RDW 17.7 (*)    All other components within normal limits  GLUCOSE, CAPILLARY - Abnormal; Notable for the following:    Glucose-Capillary 174 (*)    All other components within normal limits  GLUCOSE, CAPILLARY - Abnormal; Notable for the following:    Glucose-Capillary 106 (*)    All other components within normal limits  GLUCOSE, CAPILLARY - Abnormal; Notable for the following:    Glucose-Capillary 174 (*)    All other components within normal limits  GLUCOSE, CAPILLARY - Abnormal; Notable for the following:    Glucose-Capillary 205 (*)    All other components within normal limits  I-STAT CHEM 8, ED - Abnormal; Notable for the following:    Chloride 96 (*)    BUN 30 (*)    Creatinine, Ser 1.20 (*)    Hemoglobin 11.6 (*)    HCT 34.0 (*)    All other components within normal limits  URINE CULTURE  INFLUENZA PANEL BY PCR (TYPE A & B)  MAGNESIUM  PHOSPHORUS  BASIC METABOLIC PANEL  CBC  I-STAT TROPOININ, ED    EKG  EKG Interpretation  Date/Time:  Thursday March 25 2016 11:34:11 EST Ventricular Rate:  77 PR Interval:    QRS Duration: 80 QT Interval:  433 QTC Calculation: 491 R Axis:   12 Text Interpretation:  Sinus rhythm LVH  with secondary repolarization abnormality Minimal ST elevation, inferior leads Borderline prolonged QT interval Confirmed by Winfred Leeds  MD, SAM [phone]) on 03/25/2016 11:38:29 AM       Radiology Dg Chest Port 1 View  Result Date: 03/26/2016 CLINICAL DATA:  Pulmonary edema. History of lumpectomy for breast cancer. EXAM: PORTABLE CHEST 1 VIEW COMPARISON:  03/25/2016 FINDINGS: Stable mild elevation of the left hemidiaphragm. Indistinct pulmonary vasculature with interstitial accentuation and mild enlargement of the cardiopericardial silhouette. Atherosclerotic calcification of the aortic arch. Thoracic spondylosis. IMPRESSION: 1. Mild enlargement of the cardiopericardial  silhouette with pulmonary venous hypertension and likely mild interstitial edema. However, this is less striking than on yesterday 's exam. 2. Chronic mild elevation of the left hemidiaphragm. Electronically Signed   By: Van Clines M.D.   On: 03/26/2016 08:24   Dg Chest Port 1 View  Result Date: 03/25/2016 CLINICAL DATA:  Wheezing. EXAM: PORTABLE CHEST 1 VIEW COMPARISON:  02/12/2015, 12/21/2013 CT 11/30/2013. FINDINGS: Patient is rotated to the right. Cardiomegaly. Diffuse bilateral pulmonary interstitial prominence noted. Right chronic interstitial lung disease most likely present. Active interstitial process such as interstitial edema and/or pneumonitis cannot be excluded. Stable elevation left hemidiaphragm. No pneumothorax . IMPRESSION: 1. Cardiomegaly. 2. Chronic interstitial changes with possible active superimposed interstitial process such as interstitial edema or pneumonitis. Electronically Signed   By: Marcello Moores  Register   On: 03/25/2016 12:12   Dg Abd Portable 1v  Result Date: 03/25/2016 CLINICAL DATA:  Abdomen pain EXAM: PORTABLE ABDOMEN - 1 VIEW COMPARISON:  01/25/2015, FINDINGS: Mild air distention O two-view stomach appears bowel-gas pattern nonobstructed with moderate stool. Calcified phleboliths in the pelvis. Surgical clips in the pelvis. Scoliosis of the spine. IMPRESSION: Nonobstructed bowel-gas pattern Electronically Signed   By: Donavan Foil M.D.   On: 03/25/2016 20:33    Procedures Procedures (including critical care time)  Medications Ordered in ED Medications  saccharomyces boulardii (FLORASTOR) capsule 250 mg (250 mg Oral Given 03/26/16 0805)  metoprolol tartrate (LOPRESSOR) tablet 75 mg (75 mg Oral Given 03/26/16 0805)  Carbidopa-Levodopa ER (SINEMET CR) 25-100 MG tablet controlled release 1 tablet (1 tablet Oral Given 03/25/16 2115)  carbidopa-levodopa (SINEMET IR) 25-100 MG per tablet immediate release 1 tablet (1 tablet Oral Given 03/26/16 1235)  rasagiline  (AZILECT) tablet 1 mg (1 mg Oral Given 03/26/16 0806)  spironolactone (ALDACTONE) tablet 25 mg (25 mg Oral Given 03/26/16 0807)  levothyroxine (SYNTHROID, LEVOTHROID) tablet 175 mcg (175 mcg Oral Given 03/26/16 0807)  furosemide (LASIX) tablet 40 mg (40 mg Oral Given 03/26/16 0806)  docusate sodium (COLACE) capsule 100 mg (100 mg Oral Given 03/26/16 0805)  aspirin EC tablet 81 mg (81 mg Oral Given 03/25/16 1751)  hydrALAZINE (APRESOLINE) injection 10 mg (10 mg Intravenous Given by Other 03/26/16 0158)  enoxaparin (LOVENOX) injection 30 mg (30 mg Subcutaneous Given 03/26/16 0807)  sodium chloride flush (NS) 0.9 % injection 3 mL (3 mLs Intravenous Given 03/26/16 1000)  acetaminophen (TYLENOL) tablet 650 mg (not administered)    Or  acetaminophen (TYLENOL) suppository 650 mg (not administered)  ondansetron (ZOFRAN) tablet 4 mg (not administered)    Or  ondansetron (ZOFRAN) injection 4 mg (not administered)  insulin aspart (novoLOG) injection 0-9 Units (2 Units Subcutaneous Given 03/26/16 1235)  cefTRIAXone (ROCEPHIN) 1 g in dextrose 5 % 50 mL IVPB (1 g Intravenous Given 03/25/16 2046)  isosorbide mononitrate (IMDUR) 24 hr tablet 30 mg (30 mg Oral Given 03/26/16 1119)  hydrALAZINE (APRESOLINE) tablet 50 mg (50 mg Oral Given 03/26/16 1119)  furosemide (LASIX) injection 40 mg (40 mg Intravenous Given 03/25/16 1426)  nitroGLYCERIN (NITROGLYN) 2 % ointment 0.5 inch (0.5 inches Topical Given 03/25/16 1426)  furosemide (LASIX) injection 40 mg (40 mg Intravenous Given 03/25/16 2046)     Initial Impression / Assessment and Plan / ED Course  I have reviewed the triage vital signs and the nursing notes.  Pertinent labs & imaging results that were available during my care of the patient were reviewed by me and considered in my medical decision making (see chart for details).  Clinical Course as of Mar 27 1707  Thu Mar 25, 2016  1232 Patient with a postvoid residual urinary retention of 330 ML. She is on 2  medications Sinemet and Azalect which may cause some retention. She does not have any complaints of abdominal pain or urinary symptoms.  [AH]  1254 EKG 12-Lead [AH]  1254 DG Chest Port 1 View [AH]  1334 B Natriuretic Peptide: (!) 1,464.0 [AH]    Clinical Course User Index [AH] Margarita Mail, PA-C    Patient with apparent pulmonary edema. She's also having urinary retention. Patient given 40 of IV Lasix. Her urinary retention may be related to the medication she is taking. I discussed the case with admitting physicians. She is stable through her visit.Patient seen in shared visit with attending physician. Who agrees with assessment, work up , treatment, and plan for admission    Final Clinical Impressions(s) / ED Diagnoses   Final diagnoses:  Acute pulmonary edema [facility])  Urinary retention  Pulmonary edema  Abdominal pain    New Prescriptions Current Discharge Medication List       Margarita Mail, PA-C 03/26/16 [place], MD 03/29/16 1814

## 2016-03-25 NOTE — Progress Notes (Signed)
Pt had 6 bt run of vtach at shift change VSS and pt asymptomatic. Strip saved to epic and MD notified.  No orders received and night shift RN notified.

## 2016-03-25 NOTE — ED Notes (Signed)
Attempted report x1. 

## 2016-03-25 NOTE — H&P (Addendum)
[facility] History and Physical  AALANI MCNELLIS [id number] DOB: [birth year] DOA: 03/25/2016  Referring physician:  PCP: Hollace Kinnier, DO   Chief Complaint: "I have trouble breathing."  HPI: Victoria Lindsey is a 83 y.o. female  past medical history significant for hypertension, low thyroid, chronic kidney disease, Parkinson's, prediabetes presents emergency room with shortness of breath. History gathered from daughter &b patient. Recently multiple providers have been titrating the patient's blood pressure medication. Daughter noted her systolic blood pressure was in the 200s for a few consecutive days. Patient then began to develop shortness of breath. The original reason for removing patient's blood pressure medications from her regimen was she was complaining of symptoms of orthostatic hypotension on standing. Patient had not sustained any falls due to these symptoms. Patient's daughter had her come to the emergency room for evaluation due to her worsening shortness of breath.  Denies fevers, chills, nausea, vomiting, diarrhea, rash, headache, blurry vision, trouble hearing, sore throat.  Review of Systems:  As per HPI otherwise 10 point review of systems negative.    Past Medical History:  Diagnosis Date  . Anemia   . Breast cancer ([place])    LEFT BREAST  . Dizziness   . Hyperglycemia   . Hyperlipidemia   . Hypertension   . Hypophonia   . Hypothyroidism   . Kidney failure    stage 3  . Left ventricular diastolic dysfunction   . Orthostatic hypotension   . Parkinson's disease   . Prediabetes   . TIA (transient ischemic attack)   . Tremor    Past Surgical History:  Procedure Laterality Date  . BREAST LUMPECTOMY  2010  . CARDIOVASCULAR STRESS TEST  06/2007   NORMAL  . CATARACT EXTRACTION  2005,2006   Dr.Devonzo  . CHOLECYSTECTOMY  1994  . NASAL SINUS SURGERY     submucous resection late 1950s  . TONSILLECTOMY     Social History:  reports that she quit smoking  about 52 years ago. Her smoking use included Cigarettes. She has a 6.00 pack-year smoking history. She has never used smokeless tobacco. She reports that she does not drink alcohol or use drugs.  Allergies  Allergen Reactions  . Aciphex [Rabeprazole Sodium]     unknown  . Amantadines     unknown  . Avapro [Irbesartan]     unknown  . Cardura [Doxazosin Mesylate]     unknown  . Ciprofloxacin     unknown  . Clonidine Derivatives     unknown  . Cozaar     nightmares  . Diltiazem     unknown  . Doxycycline Hives  . Famotidine     unknown  . Hctz [Hydrochlorothiazide]     unknown  . Indapamide     unknown  . Lansoprazole     unknown  . Lisinopril     Mouth problems  . Loratadine     unknown  . Maxidex [Dexamethasone]     unknown  . Mirapex [Pramipexole Dihydrochloride]     unknown  . Norvasc [Amlodipine Besylate]     unknown  . Pantoprazole Sodium     unknown  . Prilosec [Omeprazole]     unknown  . Procardia [Nifedipine]     unknown  . Proton Pump Inhibitors     unknown  . Sulfa Drugs Cross Reactors     unknown  . Trimethoprim     unknown  . Zyrtec [Cetirizine Hcl]     unknown  . Metronidazole Rash  .  Penicillins Rash    Has patient had a PCN reaction causing immediate rash, facial/tongue/throat swelling, SOB or lightheadedness with hypotension: just a rash all over Has patient had a PCN reaction causing severe rash involving mucus membranes or skin necrosis: no Has patient had a PCN reaction that required hospitalization-no Has patient had a PCN reaction occurring within the last 10 years: no, more than 50 yrs ago If all of the above answers are "NO", then may proceed with Cephalosporin use.     Family History  Problem Relation Age of Onset  . Heart disease Mother   . Heart failure Father   . Parkinsonism Father   . Diabetes Father   . COPD Sister   . Cancer Brother   . Osteoporosis Daughter   . Stroke Sister      Prior to Admission medications     Medication Sig Start Date End Date Taking? Authorizing Provider  aspirin 81 MG tablet Take 81 mg by mouth every evening.    Yes Historical Provider, MD  BIOTIN PO Take 1,000 mcg by mouth daily.    Yes Historical Provider, MD  carbidopa-levodopa (SINEMET IR) 25-100 MG tablet TAKE ONE TABLET BY MOUTH THREE TIMES DAILY 03/22/16  Yes Rebecca S Tat, DO  Carbidopa-Levodopa ER (SINEMET CR) 25-100 MG tablet controlled release Take 1 tablet by mouth at bedtime. 03/16/16  Yes Rebecca S Tat, DO  Cholecalciferol (VITAMIN D3) 2000 UNITS TABS Take 2,000 Units by mouth daily.    Yes Historical Provider, MD  Cranberry 500 MG CAPS Take 500 mg by mouth daily.    Yes Historical Provider, MD  docusate sodium (COLACE) 100 MG capsule Take 100 mg by mouth 2 (two) times daily.    Yes Historical Provider, MD  folic acid (FOLVITE) [id number] MCG tablet Take 800 mcg by mouth daily.    Yes Historical Provider, MD  furosemide (LASIX) 40 MG tablet Take 1 tablet (40 mg total) by mouth daily. 11/05/15  Yes Jerline Pain, MD  levothyroxine (SYNTHROID) 175 MCG tablet Take one tablet by mouth once daily 30 minutes before breakfast for thyroid. Patient taking differently: Take 175 mcg by mouth daily before breakfast. BRAND NAME ONLY Take one tablet by mouth once daily 30 minutes before breakfast for thyroid. 01/12/16  Yes Tiffany L Reed, DO  magic mouthwash w/lidocaine SOLN Take 5 mLs by mouth 4 (four) times daily as needed for mouth pain. 03/18/16  Yes Tiffany L Reed, DO  metoprolol (LOPRESSOR) 50 MG tablet Take 1.5 tablets (75 mg total) by mouth 2 (two) times daily. 03/23/16  Yes Jerline Pain, MD  Multiple Vitamin (MULTIVITAMIN WITH MINERALS) TABS tablet Take 1 tablet by mouth daily.   Yes Historical Provider, MD  Omega-3 Fatty Acids (FISH OIL) 1200 MG CAPS Take 1 capsule by mouth every evening.    Yes Historical Provider, MD  POLY-IRON 150 150 MG capsule TAKE ONE CAPSULE BY MOUTH DAILY 02/23/16  Yes Tiffany L Reed, DO  Polyethyl  Glycol-Propyl Glycol (SYSTANE OP) Apply 1 drop to eye 2 (two) times daily as needed (dryness).   Yes Historical Provider, MD  rasagiline (AZILECT) 1 MG TABS tablet TAKE ONE TABLET BY MOUTH EVERY DAY 03/03/16  Yes Rebecca S Tat, DO  saccharomyces boulardii (FLORASTOR) 250 MG capsule Take 250 mg by mouth 2 (two) times daily.   Yes Historical Provider, MD  spironolactone (ALDACTONE) 25 MG tablet TAKE ONE TABLET BY MOUTH DAILY 01/27/16  Yes Jerline Pain, MD   Physical Exam: Vitals:  03/25/16 1600 03/25/16 1630 03/25/16 1700 03/25/16 1733  BP: 152/64 136/56 (!) 145/44 (!) 147/64  Pulse: 77 74 70 76  Resp: 18 18 16 17   Temp:    98 F (36.7 C)  TempSrc:    Oral  SpO2: 98% 98% 97%   Weight:    50.4 kg (111 lb 1.6 oz)  Height:    5' (1.524 m)    Wt Readings from Last 3 Encounters:  03/25/16 50.4 kg (111 lb 1.6 oz)  03/15/16 51.3 kg (113 lb)  03/15/16 51.4 kg (113 lb 6.4 oz)    General:  Appears calm and comfortable. A&Ox2 (not time) Eyes:  PERRL, EOMI, normal lids, iris ENT:  grossly normal hearing, lips & tongue Neck:  no LAD, masses or thyromegaly Cardiovascular:  RRR, no m/r/g. No LE edema.  Respiratory:  Crackles lower lung fields, nl wob Abdomen:  soft, nd, TTP LLQ/RLQ Skin:  no rash or induration seen on limited exam Musculoskeletal:  grossly normal tone BUE/BLE Psychiatric:  grossly normal mood and affect, speech fluent and appropriate Neurologic:  CN 2-12 grossly intact, moves all extremities in coordinated fashion.          Labs on Admission:  Basic Metabolic Panel:  Recent Labs Lab 03/25/16 1155 03/25/16 1239 03/25/16 1551  NA 135 135  --   K 4.4 4.3  --   CL 98* 96*  --   CO2 28  --   --   GLUCOSE 96 93  --   BUN 27* 30*  --   CREATININE 1.21* 1.20* 1.19*  CALCIUM 9.9  --   --   MG  --   --  2.0  PHOS  --   --  4.1   Liver Function Tests:  Recent Labs Lab 03/25/16 1155  AST 54*  ALT 9*  ALKPHOS 165*  BILITOT 0.5  PROT 7.2  ALBUMIN 3.4*   No  results for input(s): LIPASE, AMYLASE in the last 168 hours. No results for input(s): AMMONIA in the last 168 hours. CBC:  Recent Labs Lab 03/25/16 1155 03/25/16 1239 03/25/16 1551  WBC 9.0  --  9.4  NEUTROABS 5.5  --   --   HGB 10.7* 11.6* 10.9*  HCT 34.1* 34.0* 34.2*  MCV 106.6*  --  105.9*  PLT 143*  --  139*   Cardiac Enzymes:  Recent Labs Lab 03/25/16 1551  TROPONINI 0.04*    BNP (last 3 results)  Recent Labs  03/25/16 1155  BNP 1,464.0*    ProBNP (last 3 results) No results for input(s): PROBNP in the last 8760 hours.   Serum creatinine: 1.19 mg/dL High 03/25/16 1551 Estimated creatinine clearance: 23.5 mL/min  CBG: No results for input(s): GLUCAP in the last 168 hours.  Radiological Exams on Admission: Dg Chest Port 1 View  Result Date: 03/25/2016 CLINICAL DATA:  Wheezing. EXAM: PORTABLE CHEST 1 VIEW COMPARISON:  02/12/2015, 12/21/2013 CT 11/30/2013. FINDINGS: Patient is rotated to the right. Cardiomegaly. Diffuse bilateral pulmonary interstitial prominence noted. Right chronic interstitial lung disease most likely present. Active interstitial process such as interstitial edema and/or pneumonitis cannot be excluded. Stable elevation left hemidiaphragm. No pneumothorax . IMPRESSION: 1. Cardiomegaly. 2. Chronic interstitial changes with possible active superimposed interstitial process such as interstitial edema or pneumonitis. Electronically Signed   By: Marcello Moores  Register   On: 03/25/2016 12:12    EKG: Independently reviewed. No STEMI.  Assessment/Plan Principal Problem:   Hypertensive emergency Active Problems:   Parkinson's disease ([place])  Hypothyroidism   Chronic anemia   TIA (transient ischemic attack)   Chronic kidney disease (CKD), stage III (moderate)   Diabetes (HCC)  Hypertensive emergency Responded very well to Nitropaste and Lasix in the ED Continue Lasix for continued diuresis Resume patient's home blood pressure medications I's and  O's  Parkinson's Continue home signed  Hypothyroidism Cont OP synthroid 175 mcg qd No signs of hyper or hypothyroidism  Chronic anemia Stable, will monitor  TIA hx Cont asa  CKD At baseline Will monitor  DM SSI ACHS  UTI OP tx failure Culture sent On Rocephin  Code Status: FULL  DVT Prophylaxis: lovenox Family Communication: at bedside Disposition Plan: Pending Improvement  Status: obs tele  Elwin Mocha, MD Family Medicine [facility] [email or web address] Password [email or web address]

## 2016-03-25 NOTE — ED Provider Notes (Signed)
Complains of shortness of breath intermittent since last night. Her daughter reports that her blood pressure was [id number] systolic last night. Treated by EMS with supplemental oxygen prior to coming here. States breathing is presently normal. She's also had slight cough and sneezing over the past 2 days. No fever. No other associated symptoms.   Orlie Dakin, MD 03/25/16 1527

## 2016-03-25 NOTE — Telephone Encounter (Signed)
Thanks for update °Yennifer Segovia, MD ° °

## 2016-03-25 NOTE — ED Notes (Signed)
EDP at bedside  

## 2016-03-25 NOTE — ED Notes (Signed)
EKG done, UA collected.

## 2016-03-25 NOTE — Telephone Encounter (Signed)
Pt's daughter called answering service. Recent phone note for labored breathing and increased BP. Pt with h/o CHF, Parkinson's, HTN, orthostasis and rise in Cr per 10/2015 labs. Dtr noted overnight patient's BP frequently [id number] systolic. This AM patient has labored breathing despite meds, scared look in her eyes. I feel she needs to be evaluated in ED. Dtr asked if she should call 911 - I think this is prudent given her description of mom's status and potential delays in traffic given the weather. Dtr agreed, was grateful for call. Kaziyah Parkison PA-C

## 2016-03-26 ENCOUNTER — Observation Stay (HOSPITAL_COMMUNITY): Payer: Medicare Other

## 2016-03-26 DIAGNOSIS — J811 Chronic pulmonary edema: Secondary | ICD-10-CM | POA: Diagnosis not present

## 2016-03-26 DIAGNOSIS — N179 Acute kidney failure, unspecified: Secondary | ICD-10-CM | POA: Diagnosis not present

## 2016-03-26 DIAGNOSIS — I161 Hypertensive emergency: Secondary | ICD-10-CM

## 2016-03-26 LAB — BASIC METABOLIC PANEL
Anion gap: 11 (ref 5–15)
BUN: 30 mg/dL — AB (ref 6–20)
CHLORIDE: 94 mmol/L — AB (ref 101–111)
CO2: 32 mmol/L (ref 22–32)
Calcium: 9.6 mg/dL (ref 8.9–10.3)
Creatinine, Ser: 1.28 mg/dL — ABNORMAL HIGH (ref 0.44–1.00)
GFR calc Af Amer: 42 mL/min — ABNORMAL LOW (ref >60)
GFR calc non Af Amer: 36 mL/min — ABNORMAL LOW (ref >60)
GLUCOSE: 106 mg/dL — AB (ref 65–99)
POTASSIUM: 4.3 mmol/L (ref 3.5–5.1)
Sodium: 137 mmol/L (ref 135–145)

## 2016-03-26 LAB — GLUCOSE, CAPILLARY
GLUCOSE-CAPILLARY: 174 mg/dL — AB (ref 65–99)
GLUCOSE-CAPILLARY: 205 mg/dL — AB (ref 65–99)
Glucose-Capillary: 106 mg/dL — ABNORMAL HIGH (ref 65–99)
Glucose-Capillary: 89 mg/dL (ref 65–99)

## 2016-03-26 LAB — CBC
HEMATOCRIT: 34.7 % — AB (ref 36.0–46.0)
Hemoglobin: 11.4 g/dL — ABNORMAL LOW (ref 12.0–15.0)
MCH: 34.5 pg — AB (ref 26.0–34.0)
MCHC: 32.9 g/dL (ref 30.0–36.0)
MCV: 105.2 fL — AB (ref 78.0–100.0)
Platelets: 150 10*3/uL (ref 150–400)
RBC: 3.3 MIL/uL — ABNORMAL LOW (ref 3.87–5.11)
RDW: 17.7 % — AB (ref 11.5–15.5)
WBC: 8.5 10*3/uL (ref 4.0–10.5)

## 2016-03-26 LAB — TROPONIN I: Troponin I: 0.05 ng/mL (ref <0.03)

## 2016-03-26 MED ORDER — HYDRALAZINE HCL 50 MG PO TABS
50.0000 mg | ORAL_TABLET | Freq: Three times a day (TID) | ORAL | Status: DC
  Administered 2016-03-26 – 2016-03-29 (×10): 50 mg via ORAL
  Filled 2016-03-26 (×10): qty 1

## 2016-03-26 MED ORDER — ISOSORBIDE MONONITRATE ER 30 MG PO TB24
30.0000 mg | ORAL_TABLET | Freq: Every day | ORAL | Status: DC
  Administered 2016-03-26 – 2016-03-29 (×4): 30 mg via ORAL
  Filled 2016-03-26 (×4): qty 1

## 2016-03-26 NOTE — Progress Notes (Signed)
Pt's daughter stated that she has felt like her mother has not been voiding enough and not completely emptying her bladder. Pt voided and a bladder scan was obtained that showed 370 cc of urine. Pt denied any abdominal pain. MD aware. Will cont to monitor pt.

## 2016-03-26 NOTE — Progress Notes (Signed)
Pt has voided multiple times this shifts and has been sitting in the chair most of the afternoon. Pt has denied any abdominal pain. A bladder scan was obtained to see if pt was retaining any urine. The bladder scan showed 70 cc of urine. Will report to oncoming RN, who will cont to monitor pt.

## 2016-03-26 NOTE — Care Management Obs Status (Signed)
[place] NOTIFICATION   Patient Details  Name: Victoria Lindsey MRN: [id number] Date of Birth: 1925/09/26   Medicare Observation Status Notification Given:  Yes    Zenon Mayo, RN 03/26/2016, 4:45 PM

## 2016-03-26 NOTE — Progress Notes (Signed)
Patient Demographics:    Victoria Lindsey, is a 83 y.o. female, DOB - 06-07-1925, [id number]  Admit date - 03/25/2016   Admitting Physician Elwin Mocha, MD  Outpatient Primary MD for the patient is REED, TIFFANY, DO  LOS - 0   Chief Complaint  Patient presents with  . Shortness of Breath        Subjective:    Victoria Lindsey today has no fevers, no emesis,  No chest pain,  Daughter at bedside, questions answered   Assessment  & Plan :    Principal Problem:   Hypertensive emergency Active Problems:   Parkinson's disease ([place])   Hypothyroidism   Chronic anemia   TIA (transient ischemic attack)   Chronic kidney disease (CKD), stage III (moderate)   Diabetes (HCC)  1)HTN-poor control, add imdur and hydralazine, continue home regimen  2)Parkinson's- continue Sinemet  3)Possible UTI- concerns about urinary retention and recurrent UTI- iv Rocephin pending   4)Hypothyroidism- Cont  synthroid 175 mcg qd  5)Chronic anemia- Stable, will monitor  6)TIA hx- Cont asa  7)CKD- At baseline  8)DM-  Use Novolog/Humalog Sliding scale insulin with Accu-Cheks/Fingersticks as ordered  9)Urinary Retention- consider in and out catheterization, patient would need outpatient urology follow-up due to recurrent UTIs and concerns about urinary retention  Code Status: FULL  DVT Prophylaxis: lovenox Family Communication: at bedside Disposition Plan: Pending Improvement    Lab Results  Component Value Date   PLT 150 03/26/2016    Inpatient Medications  Scheduled Meds: . aspirin EC  81 mg Oral QPM  . carbidopa-levodopa  1 tablet Oral TID  . Carbidopa-Levodopa ER  1 tablet Oral QHS  . cefTRIAXone (ROCEPHIN)  IV  1 g Intravenous Q24H  . docusate sodium  100 mg Oral BID  . enoxaparin (LOVENOX) injection  30 mg Subcutaneous Q24H  . furosemide  40 mg Oral Daily  . hydrALAZINE  50 mg Oral TID  .  insulin aspart  0-9 Units Subcutaneous TID WC  . isosorbide mononitrate  30 mg Oral Daily  . levothyroxine  175 mcg Oral QAC breakfast  . metoprolol  75 mg Oral BID  . rasagiline  1 mg Oral Daily  . saccharomyces boulardii  250 mg Oral BID  . sodium chloride flush  3 mL Intravenous Q12H  . spironolactone  25 mg Oral Daily   Continuous Infusions: PRN Meds:.acetaminophen **OR** acetaminophen, hydrALAZINE, ondansetron **OR** ondansetron (ZOFRAN) IV    Anti-infectives    Start     Dose/Rate Route Frequency Ordered Stop   03/25/16 2100  cefTRIAXone (ROCEPHIN) 1 g in dextrose 5 % 50 mL IVPB     1 g 100 mL/hr over 30 Minutes Intravenous Every 24 hours 03/25/16 1951          Objective:   Vitals:   03/26/16 0155 03/26/16 0206 03/26/16 0500 03/26/16 0804  BP: (!) 210/71 (!) 172/57 (!) 139/49 (!) 181/70  Pulse: 80 81 82 86  Resp: (!) 24 (!) 21 (!) 23 (!) 23  Temp:   97.6 F (36.4 C)   TempSrc:      SpO2: 97% 99% 98% 94%  Weight:   48.1 kg (106 lb)   Height:        Wt Readings from  Last 3 Encounters:  03/26/16 48.1 kg (106 lb)  03/15/16 51.3 kg (113 lb)  03/15/16 51.4 kg (113 lb 6.4 oz)     Intake/Output Summary (Last 24 hours) at 03/26/16 0913 Last data filed at 03/26/16 0847  Gross per 24 hour  Intake              410 ml  Output              725 ml  Net             -315 ml     Physical Exam  Gen:- Awake Alert,  In no apparent distress  HEENT:- [place].AT, No sclera icterus Neck-Supple Neck,No JVD,.  Lungs-  CTAB  CV- S1, S2 normal Abd-  +ve B.Sounds, Abd Soft, No tenderness,   no CVA area tenderness Extremity/Skin:- No  edema,       Data Review:   Micro Results No results found for this or any previous visit (from the past 240 hour(s)).  Radiology Reports Dg Chest Port 1 View  Result Date: 03/26/2016 CLINICAL DATA:  Pulmonary edema. History of lumpectomy for breast cancer. EXAM: PORTABLE CHEST 1 VIEW COMPARISON:  03/25/2016 FINDINGS: Stable mild elevation of  the left hemidiaphragm. Indistinct pulmonary vasculature with interstitial accentuation and mild enlargement of the cardiopericardial silhouette. Atherosclerotic calcification of the aortic arch. Thoracic spondylosis. IMPRESSION: 1. Mild enlargement of the cardiopericardial silhouette with pulmonary venous hypertension and likely mild interstitial edema. However, this is less striking than on yesterday 's exam. 2. Chronic mild elevation of the left hemidiaphragm. Electronically Signed   By: Van Clines M.D.   On: 03/26/2016 08:24   Dg Chest Port 1 View  Result Date: 03/25/2016 CLINICAL DATA:  Wheezing. EXAM: PORTABLE CHEST 1 VIEW COMPARISON:  02/12/2015, 12/21/2013 CT 11/30/2013. FINDINGS: Patient is rotated to the right. Cardiomegaly. Diffuse bilateral pulmonary interstitial prominence noted. Right chronic interstitial lung disease most likely present. Active interstitial process such as interstitial edema and/or pneumonitis cannot be excluded. Stable elevation left hemidiaphragm. No pneumothorax . IMPRESSION: 1. Cardiomegaly. 2. Chronic interstitial changes with possible active superimposed interstitial process such as interstitial edema or pneumonitis. Electronically Signed   By: Marcello Moores  Register   On: 03/25/2016 12:12   Dg Abd Portable 1v  Result Date: 03/25/2016 CLINICAL DATA:  Abdomen pain EXAM: PORTABLE ABDOMEN - 1 VIEW COMPARISON:  01/25/2015, FINDINGS: Mild air distention O two-view stomach appears bowel-gas pattern nonobstructed with moderate stool. Calcified phleboliths in the pelvis. Surgical clips in the pelvis. Scoliosis of the spine. IMPRESSION: Nonobstructed bowel-gas pattern Electronically Signed   By: Donavan Foil M.D.   On: 03/25/2016 20:33     CBC  Recent Labs Lab 03/25/16 1155 03/25/16 1239 03/25/16 1551 03/26/16 0333  WBC 9.0  --  9.4 8.5  HGB 10.7* 11.6* 10.9* 11.4*  HCT 34.1* 34.0* 34.2* 34.7*  PLT 143*  --  139* 150  MCV 106.6*  --  105.9* 105.2*  MCH 33.4  --   33.7 34.5*  MCHC 31.4  --  31.9 32.9  RDW 17.7*  --  17.8* 17.7*  LYMPHSABS 2.2  --   --   --   MONOABS 1.0  --   --   --   EOSABS 0.2  --   --   --   BASOSABS 0.1  --   --   --     Chemistries   Recent Labs Lab 03/25/16 1155 03/25/16 1239 03/25/16 1551 03/26/16 0333  NA 135 135  --  137  K 4.4 4.3  --  4.3  CL 98* 96*  --  94*  CO2 28  --   --  32  GLUCOSE 96 93  --  106*  BUN 27* 30*  --  30*  CREATININE 1.21* 1.20* 1.19* 1.28*  CALCIUM 9.9  --   --  9.6  MG  --   --  2.0  --   AST 54*  --   --   --   ALT 9*  --   --   --   ALKPHOS 165*  --   --   --   BILITOT 0.5  --   --   --    ------------------------------------------------------------------------------------------------------------------ No results for input(s): CHOL, HDL, LDLCALC, TRIG, CHOLHDL, LDLDIRECT in the last 72 hours.  Lab Results  Component Value Date   HGBA1C 6.2 (H) 10/21/2015   ------------------------------------------------------------------------------------------------------------------ No results for input(s): TSH, T4TOTAL, T3FREE, THYROIDAB in the last 72 hours.  Invalid input(s): FREET3 ------------------------------------------------------------------------------------------------------------------ No results for input(s): VITAMINB12, FOLATE, FERRITIN, TIBC, IRON, RETICCTPCT in the last 72 hours.  Coagulation profile No results for input(s): INR, PROTIME in the last 168 hours.  No results for input(s): DDIMER in the last 72 hours.  Cardiac Enzymes  Recent Labs Lab 03/25/16 1551 03/25/16 2219 03/26/16 0333  TROPONINI 0.04* 0.04* 0.05*   ------------------------------------------------------------------------------------------------------------------    Component Value Date/Time   BNP 1,464.0 (H) 03/25/2016 1155   Victoria Lindsey M.D on 03/26/2016 at 9:13 AM  Between 7am to 7pm - Pager - [phone]  After 7pm go to [email or web address] - password [email or web address]  [facility] -   Office  [phone]  Dragon dictation system was used to create this note, attempts have been made to correct errors, however presence of uncorrected errors is not a reflection quality of care provided

## 2016-03-27 LAB — BASIC METABOLIC PANEL
ANION GAP: 10 (ref 5–15)
BUN: 41 mg/dL — ABNORMAL HIGH (ref 6–20)
CO2: 28 mmol/L (ref 22–32)
Calcium: 9.4 mg/dL (ref 8.9–10.3)
Chloride: 97 mmol/L — ABNORMAL LOW (ref 101–111)
Creatinine, Ser: 1.5 mg/dL — ABNORMAL HIGH (ref 0.44–1.00)
GFR calc Af Amer: 35 mL/min — ABNORMAL LOW (ref >60)
GFR, EST NON AFRICAN AMERICAN: 30 mL/min — AB (ref >60)
Glucose, Bld: 147 mg/dL — ABNORMAL HIGH (ref 65–99)
POTASSIUM: 3.9 mmol/L (ref 3.5–5.1)
SODIUM: 135 mmol/L (ref 135–145)

## 2016-03-27 LAB — CBC
HEMATOCRIT: 32.8 % — AB (ref 36.0–46.0)
HEMOGLOBIN: 10.4 g/dL — AB (ref 12.0–15.0)
MCH: 33.3 pg (ref 26.0–34.0)
MCHC: 31.7 g/dL (ref 30.0–36.0)
MCV: 105.1 fL — ABNORMAL HIGH (ref 78.0–100.0)
Platelets: 138 10*3/uL — ABNORMAL LOW (ref 150–400)
RBC: 3.12 MIL/uL — ABNORMAL LOW (ref 3.87–5.11)
RDW: 18 % — AB (ref 11.5–15.5)
WBC: 7.2 10*3/uL (ref 4.0–10.5)

## 2016-03-27 LAB — URINE CULTURE

## 2016-03-27 LAB — GLUCOSE, CAPILLARY
GLUCOSE-CAPILLARY: 180 mg/dL — AB (ref 65–99)
GLUCOSE-CAPILLARY: 141 mg/dL — AB (ref 65–99)
Glucose-Capillary: 103 mg/dL — ABNORMAL HIGH (ref 65–99)
Glucose-Capillary: 162 mg/dL — ABNORMAL HIGH (ref 65–99)

## 2016-03-27 MED ORDER — SPIRONOLACTONE 25 MG PO TABS: 12.5000 mg | ORAL_TABLET | Freq: Every day | ORAL | Status: DC

## 2016-03-27 MED ORDER — FUROSEMIDE 20 MG PO TABS: 20.0000 mg | ORAL_TABLET | Freq: Every day | ORAL | Status: DC

## 2016-03-27 NOTE — [place] FL2 (Signed)
[place] MEDICAID FL2 LEVEL OF CARE SCREENING TOOL     IDENTIFICATION  Patient Name: Victoria Lindsey Birthdate: April 23, 1925 Sex: female Admission Date (Current Location): 03/25/2016  [facility] and [place] Number:  Herbalist and Address:  [facility], [place] [address], [place], [place] [address]      Provider Number: [id number]  Attending Physician Name and Address:  Roxan Hockey, MD  Relative Name and Phone Number:  [place]    Current Level of Care: Hospital Recommended Level of Care: [place] Prior Approval Number:    Date Approved/Denied:   PASRR Number: [id number]  Discharge Plan: SNF    Current Diagnoses: Patient Active Problem List   Diagnosis Date Noted  . Hypertensive emergency 03/25/2016  . Orthostatic hypotension 12/29/2015  . Compression fracture of lumbar vertebra ([place]) 08/04/2015  . Avitaminosis D 08/04/2015  . Venous insufficiency 07/11/2015  . Pressure sore on ankle 07/11/2015  . Falls frequently 07/11/2015  . Pain in lower limb 04/17/2015  . Chronic constipation 03/24/2015  . Hypothyroidism due to acquired atrophy of thyroid 03/24/2015  . Herpes labialis 02/23/2015  . Dysphagia 02/13/2015  . Diarrhea 02/12/2015  . Onychomycosis 01/17/2015  . Folliculitis [id number]  . Encephalopathy, hypertensive 05/29/2014  . Dizziness of unknown cause 02/21/2014  . Left ventricular diastolic dysfunction with preserved systolic function ([place]) [id number]  . Hypertensive heart disease with CHF (congestive heart failure) ([place]) 12/30/2013  . Transaminitis 12/24/2013  . Palpitations 12/23/2013  . Respiratory insufficiency 12/22/2013  . Normocytic anemia 12/21/2013  . Thrombocytopenia ([place]) 12/21/2013  . Abnormal ECG 12/15/2013  . Abnormal involuntary movement 12/15/2013  . Colorectal polyps 12/15/2013  . Chest pain 12/15/2013  . Arteriosclerosis of coronary artery 12/15/2013  . Chronic kidney  disease (CKD), stage III (moderate) 12/15/2013  . Corn 12/15/2013  . Diabetes ([place]) 12/15/2013  . Breathing difficult 12/15/2013  . Acid reflux 12/15/2013  . HLD (hyperlipidemia) 12/15/2013  . BP (high blood pressure) 12/15/2013  . FOM (frequency of micturition) 12/15/2013  . Breast cancer, female ([place]) 12/15/2013  . Fungal infection of nail 12/15/2013  . Osteopenia 12/15/2013  . Raynaud's syndrome 12/15/2013  . Restless leg 12/15/2013  . Gougerout-Sjoegren syndrome ([place]) 12/15/2013  . Change in blood platelet count 12/15/2013  . Infection of urinary tract 12/15/2013  . Paroxysmal digital cyanosis 12/15/2013  . Lower urinary tract infection 10/12/2013  . Idiopathic Parkinson's disease ([place]) 09/06/2013  . Bladder spasm 09/05/2013  . Urgency of micturation 09/05/2013  . Acute on chronic diastolic (congestive) heart failure 06/15/2012  . Hyperglycemia 12/27/2011  . Sjogren's syndrome ([place]) 12/17/2010  . TIA (transient ischemic attack) 12/17/2010  . Benign hypertensive heart disease without heart failure 09/01/2010  . Parkinson's disease ([place]) 09/01/2010  . Hypothyroidism 09/01/2010  . Hypercholesterolemia 09/01/2010  . Chronic anemia 09/01/2010    Orientation RESPIRATION BLADDER Height & Weight     Self, Time, Situation, Place  Normal Continent Weight: 110 lb 1.6 oz (49.9 kg) Height:  5' (152.4 cm)  BEHAVIORAL SYMPTOMS/MOOD NEUROLOGICAL BOWEL NUTRITION STATUS      Continent Diet (See DC Summary)  AMBULATORY STATUS COMMUNICATION OF NEEDS Skin   Limited Assist Verbally Normal                       Personal Care Assistance Level of Assistance  Bathing, Dressing Bathing Assistance: Limited assistance   Dressing Assistance: Limited assistance     Functional Limitations Info  Sight,  Hearing, Speech Sight Info: Adequate Hearing Info: Adequate Speech Info: Adequate    SPECIAL CARE FACTORS FREQUENCY  PT (By licensed PT), OT (By licensed OT)     PT Frequency: 5x  wk OT Frequency: 5x wk            Contractures Contractures Info: Not present    Additional Factors Info  Code Status, Allergies Code Status Info: Full Allergies Info: Aciphex Rabeprazole Sodium, Amantadines, Avapro Irbesartan, Cardura Doxazosin Mesylate, Ciprofloxacin, Clonidine Derivatives, Cozaar, Diltiazem, Doxycycline, Famotidine, Hctz Hydrochlorothiazide, Indapamide, Lansoprazole, Lisinopril, Loratadine, Maxidex Dexamethasone, Mirapex Pramipexole Dihydrochloride, Norvasc Amlodipine Besylate, Pantoprazole Sodium, Prilosec Omeprazole, Procardia Nifedipine, Proton Pump Inhibitors, Sulfa Drugs Cross Reactors, Trimethoprim, Zyrtec Cetirizine Hcl, Metronidazole, Penicillins           Current Medications (03/27/2016):  This is the current hospital active medication list Current Facility-Administered Medications  Medication Dose Route Frequency Provider Last Rate Last Dose  . acetaminophen (TYLENOL) tablet 650 mg  650 mg Oral Q6H PRN Elwin Mocha, MD       Or  . acetaminophen (TYLENOL) suppository 650 mg  650 mg Rectal Q6H PRN Elwin Mocha, MD      . aspirin EC tablet 81 mg  81 mg Oral QPM Elwin Mocha, MD   81 mg at 03/26/16 1736  . carbidopa-levodopa (SINEMET IR) 25-100 MG per tablet immediate release 1 tablet  1 tablet Oral TID Elwin Mocha, MD   1 tablet at 03/27/16 1550  . Carbidopa-Levodopa ER (SINEMET CR) 25-100 MG tablet controlled release 1 tablet  1 tablet Oral QHS Elwin Mocha, MD   1 tablet at 03/26/16 2134  . cefTRIAXone (ROCEPHIN) 1 g in dextrose 5 % 50 mL IVPB  1 g Intravenous Q24H Elwin Mocha, MD   1 g at 03/26/16 2130  . docusate sodium (COLACE) capsule 100 mg  100 mg Oral BID Elwin Mocha, MD   100 mg at 03/27/16 0941  . enoxaparin (LOVENOX) injection 30 mg  30 mg Subcutaneous Q24H Elwin Mocha, MD   30 mg at 03/27/16 [id number]  . furosemide (LASIX) tablet 40 mg  40 mg Oral Daily Elwin Mocha, MD   40 mg at 03/27/16 0941  . hydrALAZINE  (APRESOLINE) injection 10 mg  10 mg Intravenous Q8H PRN Elwin Mocha, MD   10 mg at 03/26/16 0158  . hydrALAZINE (APRESOLINE) tablet 50 mg  50 mg Oral TID Roxan Hockey, MD   50 mg at 03/27/16 0941  . insulin aspart (novoLOG) injection 0-9 Units  0-9 Units Subcutaneous TID WC Elwin Mocha, MD   2 Units at 03/27/16 1352  . isosorbide mononitrate (IMDUR) 24 hr tablet 30 mg  30 mg Oral Daily Roxan Hockey, MD   30 mg at 03/27/16 0941  . levothyroxine (SYNTHROID, LEVOTHROID) tablet 175 mcg  175 mcg Oral QAC breakfast Elwin Mocha, MD   175 mcg at 03/27/16 [phone]  . metoprolol tartrate (LOPRESSOR) tablet 75 mg  75 mg Oral BID Elwin Mocha, MD   75 mg at 03/27/16 0941  . ondansetron (ZOFRAN) tablet 4 mg  4 mg Oral Q6H PRN Elwin Mocha, MD       Or  . ondansetron [facility]) injection 4 mg  4 mg Intravenous Q6H PRN Elwin Mocha, MD      . rasagiline (AZILECT) tablet 1 mg  1 mg Oral Daily Elwin Mocha, MD   1 mg at 03/27/16 0941  . saccharomyces boulardii (FLORASTOR)  capsule 250 mg  250 mg Oral BID Elwin Mocha, MD   250 mg at 03/27/16 0941  . sodium chloride flush (NS) 0.9 % injection 3 mL  3 mL Intravenous Q12H Elwin Mocha, MD   3 mL at 03/27/16 1000  . spironolactone (ALDACTONE) tablet 25 mg  25 mg Oral Daily Elwin Mocha, MD   25 mg at 03/27/16 [id number]     Discharge Medications: Please see discharge summary for a list of discharge medications.  Relevant Imaging Results:  Relevant Lab Results:   Additional Information [id number]  [place], Shickley B, LCSWA

## 2016-03-27 NOTE — Care Management Note (Signed)
Case Management Note  Patient Details  Name: Victoria Lindsey MRN: [id number] Date of Birth: Mar 22, 1925  Subjective/Objective:83 y.o. F who was living at [facility]. She has only stayed there 2 days per her adult daughter at the bedside since her spouse, her caregiver fell and suffered a fractured hip and is now in SNF. CM attempted to deliver ABN at the direction of Medical director only to be met with resistance and frustration about the status her mother was admitted in on 03/25/2016 which prevents her Mother from going to SNF for Rehab. . CM offered to set up [facility] services to avoid further financial burden of uncovered hospital fees. Daughter more interested in the "why's" of OBS status, etc....                  Action/Plan: Will consult with CSW to assist with placement.    Expected Discharge Date:                  Expected Discharge Plan:  Skilled Nursing Facility  In-House Referral:  Clinical Social Work  Discharge planning Services  CM Consult  Post Acute Care Choice:  Durable Medical Equipment, Home Health Choice offered to:  Patient, Adult Children  DME Arranged:    DME Agency:     HH Arranged:    [place] Agency:     Status of Service:  In process, will continue to follow  If discussed at Long Length of Stay Meetings, dates discussed:    Additional Comments:  Delrae Sawyers, RN 03/27/2016, 2:52 PM

## 2016-03-27 NOTE — Progress Notes (Signed)
Patient Demographics:    Victoria Lindsey, is a 83 y.o. female, DOB - June 22, 1925, [id number]  Admit date - 03/25/2016   Admitting Physician Elwin Mocha, MD  Outpatient Primary MD for the patient is REED, TIFFANY, DO  LOS - 0   Chief Complaint  Patient presents with  . Shortness of Breath        Subjective:    Victoria Lindsey today has no fevers, no emesis,  No chest pain,  Voiding well, son and daughter-in-law with questions, plan of care discussed   Assessment  & Plan :    Principal Problem:   Hypertensive emergency Active Problems:   Parkinson's disease ([place])   Hypothyroidism   Chronic anemia   TIA (transient ischemic attack)   Chronic kidney disease (CKD), stage III (moderate)   Diabetes ([place])  1)HTN- improved BP control, c/n  imdur 30 mg daily and hydralazine 50 mg 3 times a day, metoprolol 75 mg twice a day  2)Parkinson's- continue Sinemet  3)Possible UTI- concerns about urinary retention and recurrent UTI-  urine culture with no definite bacteria growth, okay to stop iv Rocephin after 03/27/2016 dose  4)Hypothyroidism- Cont  synthroid 141mcg qd  5)Chronic anemia- Stable, will monitor  6)TIA hx- Cont asa  7)CKD- creatinine trending up, hold Aldactone and Lasix for now, repeat BMP in a.m.  8)DM-  Use Novolog/Humalog Sliding scale insulin with Accu-Cheks/Fingersticks as ordered  9)Urinary Retention- consider in and out catheterization, patient would need outpatient urology follow-up due to recurrent UTIs and concerns about urinary retention  10)Dispo/Social- patient's daughter and son-in-law have questions about observation versus inpatient status, physical therapy evaluation noted, social worker consult for possible skilled nursing facility placement for rehabilitation versus discharge home with home health. Patient's daughter would prefer skin nursing facility for  rehabilitation. Social work consult and input appreciated. Patient has gait and mobility problems due to underlying Parkinson's disease compounded by CKD and debilitation from being in the hospital  Code Status:FULL DVT Prophylaxis: lovenox Family Communication:at bedside Disposition Plan:Pending Improvement  Lab Results  Component Value Date   PLT 138 (L) 03/27/2016    Inpatient Medications  Scheduled Meds: . aspirin EC  81 mg Oral QPM  . carbidopa-levodopa  1 tablet Oral TID  . Carbidopa-Levodopa ER  1 tablet Oral QHS  . cefTRIAXone (ROCEPHIN)  IV  1 g Intravenous Q24H  . docusate sodium  100 mg Oral BID  . enoxaparin (LOVENOX) injection  30 mg Subcutaneous Q24H  . hydrALAZINE  50 mg Oral TID  . insulin aspart  0-9 Units Subcutaneous TID WC  . isosorbide mononitrate  30 mg Oral Daily  . levothyroxine  175 mcg Oral QAC breakfast  . metoprolol  75 mg Oral BID  . rasagiline  1 mg Oral Daily  . saccharomyces boulardii  250 mg Oral BID  . sodium chloride flush  3 mL Intravenous Q12H   Continuous Infusions: PRN Meds:.acetaminophen **OR** acetaminophen, hydrALAZINE, ondansetron **OR** ondansetron (ZOFRAN) IV    Anti-infectives    Start     Dose/Rate Route Frequency Ordered Stop   03/25/16 2100  cefTRIAXone (ROCEPHIN) 1 g in dextrose 5 % 50 mL IVPB     1 g 100 mL/hr over 30 Minutes Intravenous Every 24 hours 03/25/16  1951          Objective:   Vitals:   03/27/16 0900 03/27/16 0932 03/27/16 0941 03/27/16 1338  BP: (!) 150/59 (!) 160/58 (!) 160/58 (!) 111/44  Pulse:   85 74  Resp:    16  Temp:    97.7 F (36.5 C)  TempSrc:    Oral  SpO2: 94% 96%  98%  Weight:      Height:        Wt Readings from Last 3 Encounters:  03/27/16 49.9 kg (110 lb 1.6 oz)  03/15/16 51.3 kg (113 lb)  03/15/16 51.4 kg (113 lb 6.4 oz)     Intake/Output Summary (Last 24 hours) at 03/27/16 1909 Last data filed at 03/27/16 1550  Gross per 24 hour  Intake              487 ml  Output               700 ml  Net             -213 ml     Physical Exam  Gen:- Awake Alert,  In no apparent distress  HEENT:- [place].AT, No sclera icterus Neck-Supple Neck,No JVD,.  Lungs-  CTAB  CV- S1, S2 normal Abd-  +ve B.Sounds, Abd Soft, No tenderness, no CVA area tenderness   Extremity/Skin:- No  edema,       Data Review:   Micro Results Recent Results (from the past 240 hour(s))  Culture, Urine     Status: Abnormal   Collection Time: 03/25/16 11:39 AM  Result Value Ref Range Status   Specimen Description URINE, RANDOM  Final   Special Requests NONE  Final   Culture MULTIPLE SPECIES PRESENT, SUGGEST RECOLLECTION (A)  Final   Report Status 03/27/2016 FINAL  Final    Radiology Reports Dg Chest Port 1 View  Result Date: 03/26/2016 CLINICAL DATA:  Pulmonary edema. History of lumpectomy for breast cancer. EXAM: PORTABLE CHEST 1 VIEW COMPARISON:  03/25/2016 FINDINGS: Stable mild elevation of the left hemidiaphragm. Indistinct pulmonary vasculature with interstitial accentuation and mild enlargement of the cardiopericardial silhouette. Atherosclerotic calcification of the aortic arch. Thoracic spondylosis. IMPRESSION: 1. Mild enlargement of the cardiopericardial silhouette with pulmonary venous hypertension and likely mild interstitial edema. However, this is less striking than on yesterday 's exam. 2. Chronic mild elevation of the left hemidiaphragm. Electronically Signed   By: Van Clines M.D.   On: 03/26/2016 08:24   Dg Chest Port 1 View  Result Date: 03/25/2016 CLINICAL DATA:  Wheezing. EXAM: PORTABLE CHEST 1 VIEW COMPARISON:  02/12/2015, 12/21/2013 CT 11/30/2013. FINDINGS: Patient is rotated to the right. Cardiomegaly. Diffuse bilateral pulmonary interstitial prominence noted. Right chronic interstitial lung disease most likely present. Active interstitial process such as interstitial edema and/or pneumonitis cannot be excluded. Stable elevation left hemidiaphragm. No pneumothorax  . IMPRESSION: 1. Cardiomegaly. 2. Chronic interstitial changes with possible active superimposed interstitial process such as interstitial edema or pneumonitis. Electronically Signed   By: Marcello Moores  Register   On: 03/25/2016 12:12   Dg Abd Portable 1v  Result Date: 03/25/2016 CLINICAL DATA:  Abdomen pain EXAM: PORTABLE ABDOMEN - 1 VIEW COMPARISON:  01/25/2015, FINDINGS: Mild air distention O two-view stomach appears bowel-gas pattern nonobstructed with moderate stool. Calcified phleboliths in the pelvis. Surgical clips in the pelvis. Scoliosis of the spine. IMPRESSION: Nonobstructed bowel-gas pattern Electronically Signed   By: Donavan Foil M.D.   On: 03/25/2016 20:33     CBC  Recent Labs Lab  03/25/16 1155 03/25/16 1239 03/25/16 1551 03/26/16 0333 03/27/16 0320  WBC 9.0  --  9.4 8.5 7.2  HGB 10.7* 11.6* 10.9* 11.4* 10.4*  HCT 34.1* 34.0* 34.2* 34.7* 32.8*  PLT 143*  --  139* 150 138*  MCV 106.6*  --  105.9* 105.2* 105.1*  MCH 33.4  --  33.7 34.5* 33.3  MCHC 31.4  --  31.9 32.9 31.7  RDW 17.7*  --  17.8* 17.7* 18.0*  LYMPHSABS 2.2  --   --   --   --   MONOABS 1.0  --   --   --   --   EOSABS 0.2  --   --   --   --   BASOSABS 0.1  --   --   --   --     Chemistries   Recent Labs Lab 03/25/16 1155 03/25/16 1239 03/25/16 1551 03/26/16 0333 03/27/16 0320  NA 135 135  --  137 135  K 4.4 4.3  --  4.3 3.9  CL 98* 96*  --  94* 97*  CO2 28  --   --  32 28  GLUCOSE 96 93  --  106* 147*  BUN 27* 30*  --  30* 41*  CREATININE 1.21* 1.20* 1.19* 1.28* 1.50*  CALCIUM 9.9  --   --  9.6 9.4  MG  --   --  2.0  --   --   AST 54*  --   --   --   --   ALT 9*  --   --   --   --   ALKPHOS 165*  --   --   --   --   BILITOT 0.5  --   --   --   --    ------------------------------------------------------------------------------------------------------------------ No results for input(s): CHOL, HDL, LDLCALC, TRIG, CHOLHDL, LDLDIRECT in the last 72 hours.  Lab Results  Component Value Date    HGBA1C 6.2 (H) 10/21/2015   ------------------------------------------------------------------------------------------------------------------ No results for input(s): TSH, T4TOTAL, T3FREE, THYROIDAB in the last 72 hours.  Invalid input(s): FREET3 ------------------------------------------------------------------------------------------------------------------ No results for input(s): VITAMINB12, FOLATE, FERRITIN, TIBC, IRON, RETICCTPCT in the last 72 hours.  Coagulation profile No results for input(s): INR, PROTIME in the last 168 hours.  No results for input(s): DDIMER in the last 72 hours.  Cardiac Enzymes  Recent Labs Lab 03/25/16 1551 03/25/16 2219 03/26/16 0333  TROPONINI 0.04* 0.04* 0.05*   ------------------------------------------------------------------------------------------------------------------    Component Value Date/Time   BNP 1,464.0 (H) 03/25/2016 1155     Romyn Boswell M.D on 03/27/2016 at 7:09 PM  Between 7am to 7pm - Pager - [phone]  After 7pm go to [email or web address] - password [email or web address]  [facility] -  Office  [phone]  Dragon dictation system was used to create this note, attempts have been made to correct errors, however presence of uncorrected errors is not a reflection quality of care provided

## 2016-03-27 NOTE — Clinical Social Work Peds Assess (Addendum)
CSW received referral to speak to pt's dtr. CSW met with pt and family @ bedside. Pt pleasant, alert and oriented X3. The family reported they are not in agreement with pt going home w/HHC.   CSW went over PT recommendations and provided pt/family with SNF Choice. Pt has commercial insurance and can get placed even though she's under obs. Dtr would like pt to go to [facility] because pt's spouse is there now recovering from a broken hip.  CSW called [facility] spoke to Denise,the facility has no open beds at this time. Families second choice is [facility], CSW spoke to Regina @ [facility], private room available, the family would like to accept bed. CSW notified facility. CSW notified too late to get pt to facility today,pharmacy closed at this time to get pt's meds, however pt to be transferred in the AM tomorrow.  FL2 completed and sent to facility via hub. CSW will finish coordinating pt's transfer tomorrow 1/21.  CSW will continue to follow.   B. ,MSW, LCSWA Clinical Social Work Dept Weekend Social Worker [phone] 5:30 PM   

## 2016-03-27 NOTE — Evaluation (Signed)
Physical Therapy Evaluation Patient Details Name: Victoria Lindsey MRN: [id number] DOB: 12-May-1925 Today's Date: 03/27/2016   History of Present Illness  Victoria Lindsey is a 83 y.o. female  past medical history significant for hypertension, low thyroid, chronic kidney disease, Parkinson's, prediabetes, admitted for Hypertensive emergency.  Clinical Impression  Pt admitted with above diagnosis. Pt currently with functional limitations due to the deficits listed below (see PT Problem List). Demonstrates instability with gait, frequent loss of balance to posterior, and generalized deconditioning/weakness. Daughter is beginning to have difficulty safely caring for patient and would benefit from ST-SNF to improve functional independence prior to returning home with supervision. Pt will benefit from skilled PT to increase their independence and safety with mobility to allow discharge to the venue listed below.       Follow Up Recommendations SNF;Supervision/Assistance - 24 hour (If she does not qualify for SNF - highly recommend HHPT)    Equipment Recommendations  Other (comment)    Recommendations for Other Services OT consult     Precautions / Restrictions Precautions Precautions: Fall Restrictions Weight Bearing Restrictions: No      Mobility  Bed Mobility Overal bed mobility: Needs Assistance Bed Mobility: Supine to Sit     Supine to sit: Min guard     General bed mobility comments: Close guard for safety, cues for technique. Heavy use of rail to rise from bed and required significant time and effort to accomplish this task.  Transfers Overall transfer level: Needs assistance Equipment used: Rolling walker (2 wheeled) Transfers: Sit to/from Stand Sit to Stand: Min assist         General transfer comment: Min assist for boost to stand and for balance. LOB posteriorly, cues for technique throughout.  Ambulation/Gait Ambulation/Gait assistance: Mod assist Ambulation  Distance (Feet): 20 Feet Assistive device: Rolling walker (2 wheeled) Gait Pattern/deviations: Step-through pattern;Decreased step length - right;Decreased step length - left;Decreased stride length;Shuffle;Leaning posteriorly;Trunk flexed;Narrow base of support Gait velocity: slow Gait velocity interpretation: <1.8 ft/sec, indicative of risk for recurrent falls General Gait Details: Shuffled gait with frequent posterior loss of balance requiring up to mod assist at times to steady patient. Cues for upright posture and anterior weight shift. Assist required for walker control during turns.  Stairs            Wheelchair Mobility    Modified Rankin (Stroke Patients Only)       Balance Overall balance assessment: Needs assistance Sitting-balance support: No upper extremity supported;Feet supported Sitting balance-Leahy Scale: Fair     Standing balance support: Single extremity supported Standing balance-Leahy Scale: Poor                               Pertinent Vitals/Pain Pain Assessment: 0-10 Pain Score: 5  Pain Location: abdomen Pain Descriptors / Indicators: Aching Pain Intervention(s): Monitored during session;Repositioned    Home Living Family/patient expects to be discharged to:: Unsure Living Arrangements: Children (daughter) Available Help at Discharge: Family;Available 24 hours/day Type of Home: House Home Access: Stairs to enter Entrance Stairs-Rails: Psychiatric nurse of Steps: flight Home Layout: Two level;Bed/bath upstairs Home Equipment: Bedside commode;Shower seat;Walker - 4 wheels;Wheelchair - manual      Prior Function Level of Independence: Needs assistance   Gait / Transfers Assistance Needed: Reports she walks with a rollator and someone standing with her  ADL's / Homemaking Assistance Needed: Daughter assists with bathing and dressing at home  Hand Dominance   Dominant Hand: Right    Extremity/Trunk  Assessment   Upper Extremity Assessment Upper Extremity Assessment: Defer to OT evaluation    Lower Extremity Assessment Lower Extremity Assessment: Generalized weakness       Communication   Communication: No difficulties  Cognition Arousal/Alertness: Awake/alert Behavior During Therapy: WFL for tasks assessed/performed Overall Cognitive Status: Impaired/Different from baseline Area of Impairment: Orientation;Problem solving Orientation Level: Disoriented to;Time;Situation           Problem Solving: Slow processing;Requires verbal cues      General Comments General comments (skin integrity, edema, etc.): Disoriented to date and situation. Spoke with daughter who reports she is beginning to have difficulty safely supervising and assisting patient at home as she has had continuous decline lately.    Exercises General Exercises - Lower Extremity Ankle Circles/Pumps: AROM;Both;20 reps;Supine Long Arc Quad: Strengthening;Both;10 reps;Seated   Assessment/Plan    PT Assessment Patient needs continued PT services  PT Problem List Decreased strength;Decreased range of motion;Decreased activity tolerance;Decreased balance;Decreased mobility;Decreased coordination;Decreased cognition;Decreased knowledge of use of DME;Decreased safety awareness;Decreased knowledge of precautions;Pain          PT Treatment Interventions DME instruction;Gait training;Stair training;Functional mobility training;Therapeutic activities;Therapeutic exercise;Balance training;Neuromuscular re-education;Patient/family education;Cognitive remediation    PT Goals (Current goals can be found in the Care Plan section)  Acute Rehab PT Goals Patient Stated Goal: Get stronger PT Goal Formulation: With patient/family Time For Goal Achievement: 04/10/16 Potential to Achieve Goals: Fair    Frequency Min 5X/week   Barriers to discharge        Co-evaluation               End of Session Equipment  Utilized During Treatment: Gait belt Activity Tolerance: Patient limited by fatigue Patient left: in chair;with call bell/phone within reach;with chair alarm set;with nursing/sitter in room Nurse Communication: Mobility status;Precautions    Functional Assessment Tool Used: Clinical observation Functional Limitation: Mobility: Walking and moving around Mobility: Walking and Moving Around Current Status [phone]): At least 40 percent but less than 60 percent impaired, limited or restricted Mobility: Walking and Moving Around Goal Status [phone]): At least 20 percent but less than 40 percent impaired, limited or restricted    Time: [id number] PT Time Calculation (min) (ACUTE ONLY): 47 min   Charges:   PT Evaluation $PT Eval Moderate Complexity: 1 Procedure PT Treatments $Gait Training: 8-22 mins $Therapeutic Activity: 8-22 mins   PT G Codes:   PT G-Codes **NOT FOR INPATIENT CLASS** Functional Assessment Tool Used: Clinical observation Functional Limitation: Mobility: Walking and moving around Mobility: Walking and Moving Around Current Status [id number]): At least 40 percent but less than 60 percent impaired, limited or restricted Mobility: Walking and Moving Around Goal Status [phone]): At least 20 percent but less than 40 percent impaired, limited or restricted    Ellouise Newer 03/27/2016, 10:37 AM  Elayne Snare, [place]

## 2016-03-28 DIAGNOSIS — Z7982 Long term (current) use of aspirin: Secondary | ICD-10-CM | POA: Diagnosis not present

## 2016-03-28 DIAGNOSIS — J810 Acute pulmonary edema: Secondary | ICD-10-CM | POA: Diagnosis not present

## 2016-03-28 DIAGNOSIS — R339 Retention of urine, unspecified: Secondary | ICD-10-CM | POA: Diagnosis not present

## 2016-03-28 DIAGNOSIS — Z880 Allergy status to penicillin: Secondary | ICD-10-CM | POA: Diagnosis not present

## 2016-03-28 DIAGNOSIS — N189 Chronic kidney disease, unspecified: Secondary | ICD-10-CM | POA: Diagnosis not present

## 2016-03-28 DIAGNOSIS — D631 Anemia in chronic kidney disease: Secondary | ICD-10-CM | POA: Diagnosis present

## 2016-03-28 DIAGNOSIS — Z8673 Personal history of transient ischemic attack (TIA), and cerebral infarction without residual deficits: Secondary | ICD-10-CM | POA: Diagnosis not present

## 2016-03-28 DIAGNOSIS — E039 Hypothyroidism, unspecified: Secondary | ICD-10-CM | POA: Diagnosis present

## 2016-03-28 DIAGNOSIS — R2681 Unsteadiness on feet: Secondary | ICD-10-CM | POA: Diagnosis not present

## 2016-03-28 DIAGNOSIS — N179 Acute kidney failure, unspecified: Principal | ICD-10-CM

## 2016-03-28 DIAGNOSIS — Z8249 Family history of ischemic heart disease and other diseases of the circulatory system: Secondary | ICD-10-CM | POA: Diagnosis not present

## 2016-03-28 DIAGNOSIS — Z853 Personal history of malignant neoplasm of breast: Secondary | ICD-10-CM | POA: Diagnosis not present

## 2016-03-28 DIAGNOSIS — R488 Other symbolic dysfunctions: Secondary | ICD-10-CM | POA: Diagnosis not present

## 2016-03-28 DIAGNOSIS — R262 Difficulty in walking, not elsewhere classified: Secondary | ICD-10-CM | POA: Diagnosis not present

## 2016-03-28 DIAGNOSIS — N390 Urinary tract infection, site not specified: Secondary | ICD-10-CM | POA: Diagnosis present

## 2016-03-28 DIAGNOSIS — N183 Chronic kidney disease, stage 3 (moderate): Secondary | ICD-10-CM | POA: Diagnosis present

## 2016-03-28 DIAGNOSIS — I130 Hypertensive heart and chronic kidney disease with heart failure and stage 1 through stage 4 chronic kidney disease, or unspecified chronic kidney disease: Secondary | ICD-10-CM | POA: Diagnosis present

## 2016-03-28 DIAGNOSIS — I161 Hypertensive emergency: Secondary | ICD-10-CM | POA: Diagnosis present

## 2016-03-28 DIAGNOSIS — I5033 Acute on chronic diastolic (congestive) heart failure: Secondary | ICD-10-CM | POA: Diagnosis not present

## 2016-03-28 DIAGNOSIS — E785 Hyperlipidemia, unspecified: Secondary | ICD-10-CM | POA: Diagnosis present

## 2016-03-28 DIAGNOSIS — Z79899 Other long term (current) drug therapy: Secondary | ICD-10-CM | POA: Diagnosis not present

## 2016-03-28 DIAGNOSIS — R1312 Dysphagia, oropharyngeal phase: Secondary | ICD-10-CM | POA: Diagnosis not present

## 2016-03-28 DIAGNOSIS — E1122 Type 2 diabetes mellitus with diabetic chronic kidney disease: Secondary | ICD-10-CM | POA: Diagnosis present

## 2016-03-28 DIAGNOSIS — Z9181 History of falling: Secondary | ICD-10-CM | POA: Diagnosis not present

## 2016-03-28 DIAGNOSIS — I129 Hypertensive chronic kidney disease with stage 1 through stage 4 chronic kidney disease, or unspecified chronic kidney disease: Secondary | ICD-10-CM | POA: Diagnosis not present

## 2016-03-28 DIAGNOSIS — G20 Parkinson's disease: Secondary | ICD-10-CM | POA: Diagnosis present

## 2016-03-28 DIAGNOSIS — I5032 Chronic diastolic (congestive) heart failure: Secondary | ICD-10-CM | POA: Diagnosis present

## 2016-03-28 DIAGNOSIS — Z87891 Personal history of nicotine dependence: Secondary | ICD-10-CM | POA: Diagnosis not present

## 2016-03-28 DIAGNOSIS — Z882 Allergy status to sulfonamides status: Secondary | ICD-10-CM | POA: Diagnosis not present

## 2016-03-28 DIAGNOSIS — Z888 Allergy status to other drugs, medicaments and biological substances status: Secondary | ICD-10-CM | POA: Diagnosis not present

## 2016-03-28 DIAGNOSIS — M6281 Muscle weakness (generalized): Secondary | ICD-10-CM | POA: Diagnosis not present

## 2016-03-28 LAB — CBC
HEMATOCRIT: 32.4 % — AB (ref 36.0–46.0)
HEMOGLOBIN: 10.2 g/dL — AB (ref 12.0–15.0)
MCH: 33.2 pg (ref 26.0–34.0)
MCHC: 31.5 g/dL (ref 30.0–36.0)
MCV: 105.5 fL — AB (ref 78.0–100.0)
Platelets: 138 10*3/uL — ABNORMAL LOW (ref 150–400)
RBC: 3.07 MIL/uL — ABNORMAL LOW (ref 3.87–5.11)
RDW: 17.8 % — AB (ref 11.5–15.5)
WBC: 6.8 10*3/uL (ref 4.0–10.5)

## 2016-03-28 LAB — GLUCOSE, CAPILLARY
GLUCOSE-CAPILLARY: 145 mg/dL — AB (ref 65–99)
Glucose-Capillary: 116 mg/dL — ABNORMAL HIGH (ref 65–99)
Glucose-Capillary: 125 mg/dL — ABNORMAL HIGH (ref 65–99)
Glucose-Capillary: 97 mg/dL (ref 65–99)

## 2016-03-28 LAB — BASIC METABOLIC PANEL
ANION GAP: 10 (ref 5–15)
BUN: 47 mg/dL — AB (ref 6–20)
CHLORIDE: 95 mmol/L — AB (ref 101–111)
CO2: 29 mmol/L (ref 22–32)
Calcium: 9.1 mg/dL (ref 8.9–10.3)
Creatinine, Ser: 1.76 mg/dL — ABNORMAL HIGH (ref 0.44–1.00)
GFR calc Af Amer: 29 mL/min — ABNORMAL LOW (ref >60)
GFR calc non Af Amer: 25 mL/min — ABNORMAL LOW (ref >60)
GLUCOSE: 102 mg/dL — AB (ref 65–99)
POTASSIUM: 4.5 mmol/L (ref 3.5–5.1)
Sodium: 134 mmol/L — ABNORMAL LOW (ref 135–145)

## 2016-03-28 MED ORDER — SODIUM CHLORIDE 0.9 % IV SOLN
INTRAVENOUS | Status: DC
  Administered 2016-03-28 – 2016-03-29 (×2): via INTRAVENOUS

## 2016-03-28 NOTE — Clinical Social Work Note (Signed)
CSW spoke to pt's dtr to advise bed available @ [facility], dtr reported MD called this AM and advised her that pt would have to stay another day and he would make pt inpt instead of obs. CSW will inform facility and reschedule pt's transfer [facility]).  Victoria Lindsey Clinical Social Work Dept Weekend Social Worker [phone] 9:49 AM

## 2016-03-28 NOTE — Progress Notes (Signed)
Report received via [facility] in patient's room using SBAR format, reviewed VS, labs, Tests and patient's general condition, assumed care of patient.

## 2016-03-28 NOTE — Plan of Care (Signed)
Problem: Safety: Goal: Ability to remain free from injury will improve Outcome: Progressing Patient shown and instructed on how to use her call light and it remains in her bed on her abdomen, patient also show white board but says she can't see the numbers at night so I also showed her the yellow RN button on both siderails at the head of her bed but she forgets and needs reminded, found her calling out for help and with her recent hixtory of falling, her bed alarms is on high level, will continue to monitor and instruct.

## 2016-03-28 NOTE — Progress Notes (Signed)
Patient Demographics:    Victoria Lindsey, is a 83 y.o. female, DOB - [birth year], [id number]  Admit date - 03/25/2016   Admitting Physician Elwin Mocha, MD  Outpatient Primary MD for the patient is REED, TIFFANY, DO  LOS - 0   Chief Complaint  Patient presents with  . Shortness of Breath        Subjective:    Victoria Lindsey today has no fevers, no emesis,  No chest pain,  Decreased urine output, oral intake is poor   Assessment  & Plan :    Principal Problem:   Hypertensive emergency Active Problems:   Parkinson's disease ([place])   Hypothyroidism   Chronic anemia   TIA (transient ischemic attack)   Chronic kidney disease (CKD), stage III (moderate)   Diabetes (H[facility])  1)AKI on CKD-  creatinine is  Up to 1.7, suspect due to Decrease oral intake, persistently and severely elevated blood pressures on admission probably contributed to acute kidney injury , compounded by diuresis,  hold Aldactone and Lasix for now, repeat BMP in a.m.. Start gentle IV fluid hydration, I doubt patient can drink enough to help improve her renal function at this time. Her oral intake has been poor and  this is contributing to acute kidney injury, will discontinue diuretics and start gentle IV hydration for now  HTN- improved BP control, c/n  imdur 30 mg daily and hydralazine 50 mg 3 times a day, metoprolol 75 mg twice a day  2)Parkinson's- continue Sinemet  3)Possible UTI- concerns about urinary retention and recurrent UTI-  urine culture with no definite bacteria growth,  stopped iv Rocephin after 03/27/2016 dose  4)Hypothyroidism- Cont synthroid 117mcg qd  5)Chronic anemia- Stable, will monitor  6)TIA hx- Cont asa, no new focal deficits  7)DM- Use Novolog/Humalog Sliding scale insulin with Accu-Cheks/Fingersticks as ordered  9)Urinary Retention- consider in and out catheterization, patient would need  outpatient urology follow-up due to recurrent UTIs and concerns about urinary retention  10)Dispo/Social-  physical therapy evaluation noted, social worker consult for possible skilled nursing facility placement for rehabilitation .Patient's daughter would prefer skin nursing facility for rehabilitation. Social work consult and input appreciated. Patient has gait and mobility problems due to underlying Parkinson's disease compounded by worsening renal function with underlying CKD and debilitation from being in the hospital  Code Status:FULL DVT Prophylaxis: lovenox Family Communication:at bedside Disposition Plan:Pending Improvement, hold possible discharge to skilled nursing facility pending improvement in renal function   Lab Results  Component Value Date   PLT 138 (L) 03/28/2016    Inpatient Medications  Scheduled Meds: . aspirin EC  81 mg Oral QPM  . carbidopa-levodopa  1 tablet Oral TID  . Carbidopa-Levodopa ER  1 tablet Oral QHS  . cefTRIAXone (ROCEPHIN)  IV  1 g Intravenous Q24H  . docusate sodium  100 mg Oral BID  . enoxaparin (LOVENOX) injection  30 mg Subcutaneous Q24H  . hydrALAZINE  50 mg Oral TID  . insulin aspart  0-9 Units Subcutaneous TID WC  . isosorbide mononitrate  30 mg Oral Daily  . levothyroxine  175 mcg Oral QAC breakfast  . metoprolol  75 mg Oral BID  . rasagiline  1 mg Oral Daily  . saccharomyces boulardii  250 mg  Oral BID  . sodium chloride flush  3 mL Intravenous Q12H   Continuous Infusions: PRN Meds:.acetaminophen **OR** acetaminophen, hydrALAZINE, ondansetron **OR** ondansetron (ZOFRAN) IV    Anti-infectives    Start     Dose/Rate Route Frequency Ordered Stop   03/25/16 2100  cefTRIAXone (ROCEPHIN) 1 g in dextrose 5 % 50 mL IVPB     1 g 100 mL/hr over 30 Minutes Intravenous Every 24 hours 03/25/16 1951          Objective:   Vitals:   03/27/16 0941 03/27/16 1338 03/27/16 2100 03/27/16 2153  BP: (!) 160/58 (!) 111/44 (!) 131/49 (!)  131/49  Pulse: 85 74 84 88  Resp:  16 18   Temp:  97.7 F (36.5 C) 97.6 F (36.4 C)   TempSrc:  Oral    SpO2:  98% 96%   Weight:      Height:        Wt Readings from Last 3 Encounters:  03/27/16 49.9 kg (110 lb 1.6 oz)  03/15/16 51.3 kg (113 lb)  03/15/16 51.4 kg (113 lb 6.4 oz)     Intake/Output Summary (Last 24 hours) at 03/28/16 0855 Last data filed at 03/28/16 0500  Gross per 24 hour  Intake              370 ml  Output              650 ml  Net             -280 ml     Physical Exam  Gen:- Awake Alert,  In no apparent distress  HEENT:- [place].AT, No sclera icterus Neck-Supple Neck,No JVD,.  Lungs-  CTAB  CV- S1, S2 normal Abd-  +ve B.Sounds, Abd Soft, No tenderness,    Extremity/Skin:- No  edema,       Data Review:   Micro Results Recent Results (from the past 240 hour(s))  Culture, Urine     Status: Abnormal   Collection Time: 03/25/16 11:39 AM  Result Value Ref Range Status   Specimen Description URINE, RANDOM  Final   Special Requests NONE  Final   Culture MULTIPLE SPECIES PRESENT, SUGGEST RECOLLECTION (A)  Final   Report Status 03/27/2016 FINAL  Final    Radiology Reports Dg Chest Port 1 View  Result Date: 03/26/2016 CLINICAL DATA:  Pulmonary edema. History of lumpectomy for breast cancer. EXAM: PORTABLE CHEST 1 VIEW COMPARISON:  03/25/2016 FINDINGS: Stable mild elevation of the left hemidiaphragm. Indistinct pulmonary vasculature with interstitial accentuation and mild enlargement of the cardiopericardial silhouette. Atherosclerotic calcification of the aortic arch. Thoracic spondylosis. IMPRESSION: 1. Mild enlargement of the cardiopericardial silhouette with pulmonary venous hypertension and likely mild interstitial edema. However, this is less striking than on yesterday 's exam. 2. Chronic mild elevation of the left hemidiaphragm. Electronically Signed   By: Van Clines M.D.   On: 03/26/2016 08:24   Dg Chest Port 1 View  Result Date:  03/25/2016 CLINICAL DATA:  Wheezing. EXAM: PORTABLE CHEST 1 VIEW COMPARISON:  02/12/2015, 12/21/2013 CT 11/30/2013. FINDINGS: Patient is rotated to the right. Cardiomegaly. Diffuse bilateral pulmonary interstitial prominence noted. Right chronic interstitial lung disease most likely present. Active interstitial process such as interstitial edema and/or pneumonitis cannot be excluded. Stable elevation left hemidiaphragm. No pneumothorax . IMPRESSION: 1. Cardiomegaly. 2. Chronic interstitial changes with possible active superimposed interstitial process such as interstitial edema or pneumonitis. Electronically Signed   By: Marcello Moores  Register   On: 03/25/2016 12:12   Dg Abd  Portable 1v  Result Date: 03/25/2016 CLINICAL DATA:  Abdomen pain EXAM: PORTABLE ABDOMEN - 1 VIEW COMPARISON:  01/25/2015, FINDINGS: Mild air distention O two-view stomach appears bowel-gas pattern nonobstructed with moderate stool. Calcified phleboliths in the pelvis. Surgical clips in the pelvis. Scoliosis of the spine. IMPRESSION: Nonobstructed bowel-gas pattern Electronically Signed   By: Donavan Foil M.D.   On: 03/25/2016 20:33     CBC  Recent Labs Lab 03/25/16 1155 03/25/16 1239 03/25/16 1551 03/26/16 0333 03/27/16 0320 03/28/16 0351  WBC 9.0  --  9.4 8.5 7.2 6.8  HGB 10.7* 11.6* 10.9* 11.4* 10.4* 10.2*  HCT 34.1* 34.0* 34.2* 34.7* 32.8* 32.4*  PLT 143*  --  139* 150 138* 138*  MCV 106.6*  --  105.9* 105.2* 105.1* 105.5*  MCH 33.4  --  33.7 34.5* 33.3 33.2  MCHC 31.4  --  31.9 32.9 31.7 31.5  RDW 17.7*  --  17.8* 17.7* 18.0* 17.8*  LYMPHSABS 2.2  --   --   --   --   --   MONOABS 1.0  --   --   --   --   --   EOSABS 0.2  --   --   --   --   --   BASOSABS 0.1  --   --   --   --   --     Chemistries   Recent Labs Lab 03/25/16 1155 03/25/16 1239 03/25/16 1551 03/26/16 0333 03/27/16 0320 03/28/16 0351  NA 135 135  --  137 135 134*  K 4.4 4.3  --  4.3 3.9 4.5  CL 98* 96*  --  94* 97* 95*  CO2 28  --   --   32 28 29  GLUCOSE 96 93  --  106* 147* 102*  BUN 27* 30*  --  30* 41* 47*  CREATININE 1.21* 1.20* 1.19* 1.28* 1.50* 1.76*  CALCIUM 9.9  --   --  9.6 9.4 9.1  MG  --   --  2.0  --   --   --   AST 54*  --   --   --   --   --   ALT 9*  --   --   --   --   --   ALKPHOS 165*  --   --   --   --   --   BILITOT 0.5  --   --   --   --   --    ------------------------------------------------------------------------------------------------------------------ No results for input(s): CHOL, HDL, LDLCALC, TRIG, CHOLHDL, LDLDIRECT in the last 72 hours.  Lab Results  Component Value Date   HGBA1C 6.2 (H) 10/21/2015   ------------------------------------------------------------------------------------------------------------------ No results for input(s): TSH, T4TOTAL, T3FREE, THYROIDAB in the last 72 hours.  Invalid input(s): FREET3 ------------------------------------------------------------------------------------------------------------------ No results for input(s): VITAMINB12, FOLATE, FERRITIN, TIBC, IRON, RETICCTPCT in the last 72 hours.  Coagulation profile No results for input(s): INR, PROTIME in the last 168 hours.  No results for input(s): DDIMER in the last 72 hours.  Cardiac Enzymes  Recent Labs Lab 03/25/16 1551 03/25/16 2219 03/26/16 0333  TROPONINI 0.04* 0.04* 0.05*   ------------------------------------------------------------------------------------------------------------------    Component Value Date/Time   BNP 1,464.0 (H) 03/25/2016 1155     Satoria Dunlop M.D on 03/28/2016 at 8:55 AM  Between 7am to 7pm - Pager - [phone]  After 7pm go to [email or web address] - password [facility]  [facility] -  Office  [phone]  Dragon dictation system was used to  create this note, attempts have been made to correct errors, however presence of uncorrected errors is not a reflection quality of care provided

## 2016-03-28 NOTE — Clinical Social Work Note (Signed)
Clinical Social Work Assessment  Patient Details  Name: Victoria Lindsey MRN: [id number] Date of Birth: [birth year]  Date of referral:  03/28/16               Reason for consult:  Facility Placement, Discharge Planning                Permission sought to share information with:  Family Supports (Dtr/Son-In-Law) Permission granted to share information::  Yes, Verbal Permission Granted  Name::        Agency::   ([facility])  Relationship::     Contact Information:     Housing/Transportation Living arrangements for the past 2 months:  Apartment Source of Information:  Patient, Adult Children Patient Interpreter Needed:  None Criminal Activity/Legal Involvement Pertinent to Current Situation/Hospitalization:  No - Comment as needed Significant Relationships:  Adult Children, Spouse Lives with:  Spouse Do you feel safe going back to the place where you live?  No Need for family participation in patient care:  Yes (Comment)  Care giving concerns: Pt's dtr has been pt's primary caregiver and is available if need be.  Pt was living with spouse in a ALF, spouse had a recent fall and broke his hip. Spouse currently in SNF ([facility]).Pt understands that she needs to gain strength so that she can return home independent.   Social Worker assessment / plan:  Holiday representative met with pt @ bedside to offer support and discuss patients needs at discharge. Pt reports that she has been living with her spouse @ a ALF. The couple own a home but are unable to ambulate throughout the home at this time. CSW discussed SNF, pt and dtr on board. Pt would like to join her spouse @ [facility] but understands that there are no beds available at the facility .CSW to complete necessary paperwork and initiate SNF placement on pt's behalf. CSW will FU with pt once bed offers are available and coordinate safe DC to SNF.  Employment status:  Retired Nurse, adult PT  Recommendations:  [place], [place] / Referral to community resources:     Patient/Family's Response to care:  Pt very appreciative of CSW's involvement with placement and pt thankful of the great care she is receiving on her floor.  Patient/Family's Understanding of and Emotional Response to Diagnosis, Current Treatment, and Prognosis:  Pt understands that rehab will help her gain her independence and pt is hopeful to get back to her baseline.  Emotional Assessment Appearance:  Appears stated age Attitude/Demeanor/Rapport:  Other (Pleasant) Affect (typically observed):  Accepting, Appropriate, Pleasant Orientation:  Oriented to Self, Oriented to Situation, Oriented to Place, Oriented to  Time Alcohol / Substance use:  Never Used Psych involvement (Current and /or in the community):  No (Comment)  Discharge Needs  Concerns to be addressed:  Home Safety Concerns Readmission within the last 30 days:  No Current discharge risk:  Dependent with Mobility Barriers to Discharge:  Continued Medical Work up   [place], [place], [place] 03/28/2016, 9:33 AM

## 2016-03-29 DIAGNOSIS — E034 Atrophy of thyroid (acquired): Secondary | ICD-10-CM | POA: Diagnosis not present

## 2016-03-29 DIAGNOSIS — R402312 Coma scale, best motor response, none, at arrival to emergency department: Secondary | ICD-10-CM | POA: Diagnosis not present

## 2016-03-29 DIAGNOSIS — Z4682 Encounter for fitting and adjustment of non-vascular catheter: Secondary | ICD-10-CM | POA: Diagnosis not present

## 2016-03-29 DIAGNOSIS — R739 Hyperglycemia, unspecified: Secondary | ICD-10-CM | POA: Diagnosis not present

## 2016-03-29 DIAGNOSIS — R488 Other symbolic dysfunctions: Secondary | ICD-10-CM | POA: Diagnosis not present

## 2016-03-29 DIAGNOSIS — S40021A Contusion of right upper arm, initial encounter: Secondary | ICD-10-CM | POA: Diagnosis not present

## 2016-03-29 DIAGNOSIS — E875 Hyperkalemia: Secondary | ICD-10-CM | POA: Diagnosis not present

## 2016-03-29 DIAGNOSIS — E872 Acidosis: Secondary | ICD-10-CM | POA: Diagnosis not present

## 2016-03-29 DIAGNOSIS — Y95 Nosocomial condition: Secondary | ICD-10-CM | POA: Diagnosis present

## 2016-03-29 DIAGNOSIS — R531 Weakness: Secondary | ICD-10-CM | POA: Diagnosis not present

## 2016-03-29 DIAGNOSIS — J9601 Acute respiratory failure with hypoxia: Secondary | ICD-10-CM | POA: Diagnosis not present

## 2016-03-29 DIAGNOSIS — Z9181 History of falling: Secondary | ICD-10-CM | POA: Diagnosis not present

## 2016-03-29 DIAGNOSIS — S3023XA Contusion of vagina and vulva, initial encounter: Secondary | ICD-10-CM | POA: Diagnosis present

## 2016-03-29 DIAGNOSIS — R34 Anuria and oliguria: Secondary | ICD-10-CM | POA: Diagnosis not present

## 2016-03-29 DIAGNOSIS — R402212 Coma scale, best verbal response, none, at arrival to emergency department: Secondary | ICD-10-CM | POA: Diagnosis not present

## 2016-03-29 DIAGNOSIS — J189 Pneumonia, unspecified organism: Secondary | ICD-10-CM | POA: Diagnosis not present

## 2016-03-29 DIAGNOSIS — R262 Difficulty in walking, not elsewhere classified: Secondary | ICD-10-CM | POA: Diagnosis not present

## 2016-03-29 DIAGNOSIS — N189 Chronic kidney disease, unspecified: Secondary | ICD-10-CM | POA: Diagnosis not present

## 2016-03-29 DIAGNOSIS — I469 Cardiac arrest, cause unspecified: Secondary | ICD-10-CM | POA: Diagnosis not present

## 2016-03-29 DIAGNOSIS — G9340 Encephalopathy, unspecified: Secondary | ICD-10-CM | POA: Diagnosis not present

## 2016-03-29 DIAGNOSIS — R1312 Dysphagia, oropharyngeal phase: Secondary | ICD-10-CM | POA: Diagnosis not present

## 2016-03-29 DIAGNOSIS — R6521 Severe sepsis with septic shock: Secondary | ICD-10-CM | POA: Diagnosis not present

## 2016-03-29 DIAGNOSIS — R402112 Coma scale, eyes open, never, at arrival to emergency department: Secondary | ICD-10-CM | POA: Diagnosis not present

## 2016-03-29 DIAGNOSIS — I161 Hypertensive emergency: Secondary | ICD-10-CM | POA: Diagnosis not present

## 2016-03-29 DIAGNOSIS — M6281 Muscle weakness (generalized): Secondary | ICD-10-CM | POA: Diagnosis not present

## 2016-03-29 DIAGNOSIS — I129 Hypertensive chronic kidney disease with stage 1 through stage 4 chronic kidney disease, or unspecified chronic kidney disease: Secondary | ICD-10-CM | POA: Diagnosis not present

## 2016-03-29 DIAGNOSIS — S1093XA Contusion of unspecified part of neck, initial encounter: Secondary | ICD-10-CM | POA: Diagnosis not present

## 2016-03-29 DIAGNOSIS — G931 Anoxic brain damage, not elsewhere classified: Secondary | ICD-10-CM | POA: Diagnosis not present

## 2016-03-29 DIAGNOSIS — R4182 Altered mental status, unspecified: Secondary | ICD-10-CM | POA: Diagnosis present

## 2016-03-29 DIAGNOSIS — R0609 Other forms of dyspnea: Secondary | ICD-10-CM | POA: Diagnosis not present

## 2016-03-29 DIAGNOSIS — N390 Urinary tract infection, site not specified: Secondary | ICD-10-CM | POA: Diagnosis not present

## 2016-03-29 DIAGNOSIS — Z452 Encounter for adjustment and management of vascular access device: Secondary | ICD-10-CM | POA: Diagnosis not present

## 2016-03-29 DIAGNOSIS — R2681 Unsteadiness on feet: Secondary | ICD-10-CM | POA: Diagnosis not present

## 2016-03-29 DIAGNOSIS — S40022A Contusion of left upper arm, initial encounter: Secondary | ICD-10-CM | POA: Diagnosis not present

## 2016-03-29 DIAGNOSIS — J211 Acute bronchiolitis due to human metapneumovirus: Secondary | ICD-10-CM | POA: Diagnosis not present

## 2016-03-29 DIAGNOSIS — Z0441 Encounter for examination and observation following alleged adult rape: Secondary | ICD-10-CM | POA: Diagnosis present

## 2016-03-29 DIAGNOSIS — N183 Chronic kidney disease, stage 3 (moderate): Secondary | ICD-10-CM | POA: Diagnosis not present

## 2016-03-29 DIAGNOSIS — G20 Parkinson's disease: Secondary | ICD-10-CM | POA: Diagnosis not present

## 2016-03-29 DIAGNOSIS — R339 Retention of urine, unspecified: Secondary | ICD-10-CM | POA: Diagnosis not present

## 2016-03-29 DIAGNOSIS — R05 Cough: Secondary | ICD-10-CM | POA: Diagnosis not present

## 2016-03-29 DIAGNOSIS — A419 Sepsis, unspecified organism: Secondary | ICD-10-CM | POA: Diagnosis not present

## 2016-03-29 DIAGNOSIS — E039 Hypothyroidism, unspecified: Secondary | ICD-10-CM | POA: Diagnosis not present

## 2016-03-29 DIAGNOSIS — D696 Thrombocytopenia, unspecified: Secondary | ICD-10-CM | POA: Diagnosis not present

## 2016-03-29 DIAGNOSIS — Z66 Do not resuscitate: Secondary | ICD-10-CM | POA: Diagnosis not present

## 2016-03-29 DIAGNOSIS — I5033 Acute on chronic diastolic (congestive) heart failure: Secondary | ICD-10-CM | POA: Diagnosis not present

## 2016-03-29 DIAGNOSIS — X58XXXA Exposure to other specified factors, initial encounter: Secondary | ICD-10-CM | POA: Diagnosis present

## 2016-03-29 DIAGNOSIS — J810 Acute pulmonary edema: Secondary | ICD-10-CM | POA: Diagnosis not present

## 2016-03-29 DIAGNOSIS — N179 Acute kidney failure, unspecified: Secondary | ICD-10-CM | POA: Diagnosis not present

## 2016-03-29 LAB — BASIC METABOLIC PANEL
ANION GAP: 9 (ref 5–15)
Anion gap: 13 (ref 5–15)
BUN: 44 mg/dL — AB (ref 6–20)
BUN: 48 mg/dL — AB (ref 4–21)
BUN: 48 mg/dL — ABNORMAL HIGH (ref 6–20)
CALCIUM: 8.9 mg/dL (ref 8.9–10.3)
CHLORIDE: 97 mmol/L — AB (ref 101–111)
CO2: 25 mmol/L (ref 22–32)
CO2: 25 mmol/L (ref 22–32)
CREATININE: 1.56 mg/dL — AB (ref 0.44–1.00)
Calcium: 8.6 mg/dL — ABNORMAL LOW (ref 8.9–10.3)
Chloride: 98 mmol/L — ABNORMAL LOW (ref 101–111)
Creatinine, Ser: 1.56 mg/dL — ABNORMAL HIGH (ref 0.44–1.00)
Creatinine: 1.6 mg/dL — AB (ref 0.5–1.1)
GFR calc Af Amer: 33 mL/min — ABNORMAL LOW (ref >60)
GFR, EST AFRICAN AMERICAN: 33 mL/min — AB (ref >60)
GFR, EST NON AFRICAN AMERICAN: 29 mL/min — AB (ref >60)
GFR, EST NON AFRICAN AMERICAN: 29 mL/min — AB (ref >60)
Glucose, Bld: 98 mg/dL (ref 65–99)
Glucose, Bld: 176 mg/dL — ABNORMAL HIGH (ref 65–99)
POTASSIUM: 4.2 mmol/L (ref 3.5–5.1)
Potassium: 4.1 mmol/L (ref 3.5–5.1)
SODIUM: 132 mmol/L — AB (ref 135–145)
SODIUM: 135 mmol/L (ref 135–145)
Sodium: 135 mmol/L — AB (ref 137–147)

## 2016-03-29 LAB — CBC
HEMATOCRIT: 32.1 % — AB (ref 36.0–46.0)
Hemoglobin: 10.1 g/dL — ABNORMAL LOW (ref 12.0–15.0)
MCH: 33 pg (ref 26.0–34.0)
MCHC: 31.5 g/dL (ref 30.0–36.0)
MCV: 104.9 fL — ABNORMAL HIGH (ref 78.0–100.0)
PLATELETS: 145 10*3/uL — AB (ref 150–400)
RBC: 3.06 MIL/uL — ABNORMAL LOW (ref 3.87–5.11)
RDW: 17.8 % — AB (ref 11.5–15.5)
WBC: 6.6 10*3/uL (ref 4.0–10.5)

## 2016-03-29 LAB — GLUCOSE, CAPILLARY
GLUCOSE-CAPILLARY: 115 mg/dL — AB (ref 65–99)
GLUCOSE-CAPILLARY: 119 mg/dL — AB (ref 65–99)

## 2016-03-29 LAB — CBC AND DIFFERENTIAL: WBC: 6.6 10*3/mL

## 2016-03-29 MED ORDER — METOPROLOL TARTRATE 75 MG PO TABS: 75.0000 mg | ORAL_TABLET | Freq: Two times a day (BID) | ORAL | 0 refills | Status: AC

## 2016-03-29 MED ORDER — HYDRALAZINE HCL 100 MG PO TABS: 100.0000 mg | ORAL_TABLET | Freq: Three times a day (TID) | ORAL | 0 refills | Status: AC

## 2016-03-29 MED ORDER — ISOSORBIDE MONONITRATE ER 30 MG PO TB24: 30.0000 mg | ORAL_TABLET | Freq: Every day | ORAL | 0 refills | Status: AC

## 2016-03-29 NOTE — Progress Notes (Signed)
Updated report received in patient's room via [facility], reviewed events of the day, assumed care of patient.

## 2016-03-29 NOTE — Discharge Summary (Signed)
Victoria Lindsey, is a 83 y.o. female  DOB Dec 28, 1925  MRN [id number].  Admission date:  03/25/2016  Admitting Physician  Elwin Mocha, MD  Discharge Date:  03/29/2016   Primary MD  Hollace Kinnier, DO  Recommendations for primary care physician for things to follow:   1)Please Recheck BMP on Thursday, January 25 2)Please follow up with urologist as outpatient due to urinary retention and recurrent UTIs (you may Need renal/Kidney ultrasound) 3)Please refer to nephrologist if renal function worsens (creatinine is 1.5 on 03/29/2016, Please repeat BMP on 04/01/2016) 4) Aldactone is on hold due to kidney concerns 5) Lasix may be restarted at 20 mg daily on 04/01/2016 if renal function is stable 6)Avoid nonsteroidal anti-inflammatory agents and other nephrotoxic agents   Admission Diagnosis  Urinary retention [R33.9] Acute pulmonary edema ([place]) [J81.0] Pulmonary edema [J81.1]   Discharge Diagnosis  Urinary retention [R33.9] Acute pulmonary edema ([place]) [J81.0] Pulmonary edema [J81.1]    Principal Problem:   AKI (acute kidney injury)/ on CKD Active Problems:   Parkinson's disease ([place])   Hypothyroidism   Chronic anemia   TIA (transient ischemic attack)   Left ventricular diastolic dysfunction with preserved systolic function (HCC)   Chronic kidney disease (CKD), stage III (moderate)   Diabetes ([place])   Hypertensive emergency      Past Medical History:  Diagnosis Date  . Anemia   . Breast cancer ([place])    LEFT BREAST  . Dizziness   . Hyperglycemia   . Hyperlipidemia   . Hypertension   . Hypophonia   . Hypothyroidism   . Kidney failure    stage 3  . Left ventricular diastolic dysfunction   . Orthostatic hypotension   . Parkinson's disease   . Prediabetes   . TIA (transient ischemic attack)   . Tremor     Past Surgical History:  Procedure Laterality Date  . BREAST LUMPECTOMY  2010  .  CARDIOVASCULAR STRESS TEST  06/2007   NORMAL  . CATARACT EXTRACTION  2005,2006   Dr.Devonzo  . CHOLECYSTECTOMY  1994  . NASAL SINUS SURGERY     submucous resection late 1950s  . TONSILLECTOMY         HPI  from the history and physical done on the day of admission:     Chief Complaint: "I have trouble breathing."  HPI: Victoria Lindsey is a 83 y.o. female  past medical history significant for hypertension, low thyroid, chronic kidney disease, Parkinson's, prediabetes presents emergency room with shortness of breath. History gathered from daughter &b patient. Recently multiple providers have been titrating the patient's blood pressure medication. Daughter noted her systolic blood pressure was in the 200s for a few consecutive days. Patient then began to develop shortness of breath. The original reason for removing patient's blood pressure medications from her regimen was she was complaining of symptoms of orthostatic hypotension on standing. Patient had not sustained any falls due to these symptoms. Patient's daughter had her come to the emergency room for evaluation due to her  worsening shortness of breath.  Denies fevers, chills, nausea, vomiting, diarrhea, rash, headache, blurry vision, trouble hearing, sore throat.  Review of Systems:  As per HPI otherwise 10 point review of systems negative.     Hospital Course:   Interval summary:- Patient was admitted on 03/25/2016 with shortness of breath systolic blood pressure over 200 mmhg and clinical and radiological evidence of pulmonary venous hypertension/congestion. Patient subsequently developed AKI most likely due to hypertensive emergency with persistently elevated blood pressures compounded poor oral intake and also by diuresis. Diuretics were discontinued, patient was given gentle hydration. Creatinine initially peaked at 1.76, repeat creatinine today is 1.56. No further clinical evidence of volume overload status at this time     Plan:- 1)AKI on CKD-   improved with discontinuation of diuretics and with gentle IV hydration,  creatinine is  down to 1.56, suspect due to Decrease oral intake, persistently and severely elevated blood pressures on admission probably contributed to acute kidney injury , compounded by diuresis,   continue to hold Aldactone, repeat BMP on 04/01/2016, may restart low dose Lasix at 20 mm daily if renal function stable at that time.   I hope patient can drink enough to help improve her renal function further, IV fluids will be discontinued upon discharge to skilled nursing facility. Her oral intake was poor and  this is contributing to acute kidney injury. Please see interval summary above  2)HTN- improved BP control, on admission systolic BP was in the [id number] mmhg range, BP is still not at goal, c/n imdur 30 mg daily, but increase Hydralazineto 100  mg 3 times a day,c/n metoprolol 75 mg twice a day. Patient apparently had some intolerance to amlodipine in the past, if blood pressure remains uncontrolled despite above medication changes consider a retrial of amlodipine  3)Possible UTI- concerns about urinary retention and recurrent UTI- urine culture with no definite bacteria growth,  stopped iv Rocephin after 03/27/2016 dose, outpatient follow-up with urologist due to urinary retention advised, patient may need renal ultrasound  4)Hypothyroidism- Cont synthroid 142mcg qd  5)Chronic anemia- Stable, will monitor  6)TIA hx- Cont asa, no new focal deficits  7)Parkinson's- continue Sinemet  8) diastolic CHF- EF based on echocardiogram from December 2016 is over 65%, echo also showed diastolic dysfunction, clinically at this time patient does not appear wet, if anything she might be a bit on the dry side  9)Urinary Retention- consider in and out catheterization, patient would need outpatient urology follow-up due to recurrent UTIs and concerns about urinary retention  10)Dispo/Social-   physical therapy evaluation noted, social worker consult for possible skilled nursing facility placement for rehabilitation appreciated . Patient has gait and mobility problems due to underlying Parkinson's disease compounded by worsening renal function with underlying CKDand debilitation from being in the hospital  Code Status:FULL Family Communication: daughter notified Disposition Plan: SNF for rehabilitation   Discharge Condition: improved  Follow UP 1)Please Recheck BMP on Thursday, January 25 2)Please follow up with urologist as outpatient due to urinary retention and recurrent UTIs (you may Need renal/Kidney ultrasound 3)Please refer to nephrologist if renal function worsens (creatinine is 1.5 on 03/29/2016, Please repeat BMP on 04/01/2016) 4) Aldactone is on hold due to kidney concerns 5) Lasix may be restarted at 20 mg daily on 04/01/2016 if renal function is stable 6)Avoid nonsteroidal anti-inflammatory agents and other nephrotoxic agents    Consults obtained - physical therapy and social work  Diet and Activity recommendation:  As advised  Discharge Instructions  Discharge Instructions    Call MD for:  difficulty breathing, headache or visual disturbances    Complete by:  As directed    Call MD for:  persistant dizziness or light-headedness    Complete by:  As directed    Call MD for:  persistant nausea and vomiting    Complete by:  As directed    Call MD for:  severe uncontrolled pain    Complete by:  As directed    Call MD for:  temperature >100.4    Complete by:  As directed    Diet - low sodium heart healthy    Complete by:  As directed    Discharge instructions    Complete by:  As directed    1)Please Recheck BMP on Thursday, January 25 2)Please follow up with urologist as outpatient due to urinary retention and recurrent UTIs (you may Need renal/Kidney ultrasound 3)Please refer to nephrologist if renal function worsens (creatinine is 1.5 on  03/29/2016, Please repeat BMP on 04/01/2016) 4) Aldactone is on hold due to kidney concerns 5) Lasix may be restarted at 20 mg daily on 04/01/2016 if renal function is stable 6)Avoid nonsteroidal anti-inflammatory agents and other nephrotoxic agents   Increase activity slowly    Complete by:  As directed    Other Restrictions    Complete by:  As directed    1)Please Recheck BMP on Thursday, January 25 2)Please follow up with urologist as outpatient due to urinary retention and recurrent UTIs (you may Need renal/Kidney ultrasound 3)Please refer to nephrologist if renal function worsens (creatinine is 1.5 on 03/29/2016, Please repeat BMP on 04/01/2016) 4) Aldactone is on hold due to kidney concerns 5) Lasix may be restarted at 20 mg daily on 04/01/2016 if renal function is stable 6)Avoid nonsteroidal anti-inflammatory agents and other nephrotoxic agents       Discharge Medications     Allergies as of 03/29/2016      Reactions   Aciphex [rabeprazole Sodium]    unknown   Amantadines    unknown   Avapro [irbesartan]    unknown   Cardura [doxazosin Mesylate]    unknown   Ciprofloxacin    unknown   Clonidine Derivatives    unknown   Cozaar    nightmares   Diltiazem    unknown   Doxycycline Hives   Famotidine    unknown   Hctz [hydrochlorothiazide]    unknown   Indapamide    unknown   Lansoprazole    unknown   Lisinopril    Mouth problems   Loratadine    unknown   Maxidex [dexamethasone]    unknown   Mirapex [pramipexole Dihydrochloride]    unknown   Norvasc [amlodipine Besylate]    unknown   Pantoprazole Sodium    unknown   Prilosec [omeprazole]    unknown   Procardia [nifedipine]    unknown   Proton Pump Inhibitors    unknown   Sulfa Drugs Cross Reactors    unknown   Trimethoprim    unknown   Zyrtec [cetirizine Hcl]    unknown   Metronidazole Rash   Penicillins Rash   Has patient had a PCN reaction causing immediate rash, facial/tongue/throat  swelling, SOB or lightheadedness with hypotension: just a rash all over Has patient had a PCN reaction causing severe rash involving mucus membranes or skin necrosis: no Has patient had a PCN reaction that required hospitalization-no Has patient had a PCN reaction occurring within the last 10 years: no,  more than 50 yrs ago If all of the above answers are "NO", then may proceed with Cephalosporin use.      Medication List    STOP taking these medications   furosemide 40 MG tablet Commonly known as:  LASIX   spironolactone 25 MG tablet Commonly known as:  ALDACTONE     TAKE these medications   aspirin 81 MG tablet Take 81 mg by mouth every evening.   BIOTIN PO Take 1,000 mcg by mouth daily.   Carbidopa-Levodopa ER 25-100 MG tablet controlled release Commonly known as:  SINEMET CR Take 1 tablet by mouth at bedtime.   carbidopa-levodopa 25-100 MG tablet Commonly known as:  SINEMET IR TAKE ONE TABLET BY MOUTH THREE TIMES DAILY   Cranberry 500 MG Caps Take 500 mg by mouth daily.   docusate sodium 100 MG capsule Commonly known as:  COLACE Take 100 mg by mouth 2 (two) times daily.   Fish Oil 1200 MG Caps Take 1 capsule by mouth every evening.   folic acid Q000111Q MCG tablet Commonly known as:  FOLVITE Take 800 mcg by mouth daily.   hydrALAZINE 100 MG tablet Commonly known as:  APRESOLINE Take 1 tablet (100 mg total) by mouth 3 (three) times daily.   isosorbide mononitrate 30 MG 24 hr tablet Commonly known as:  IMDUR Take 1 tablet (30 mg total) by mouth daily. Start taking on:  03/30/2016   levothyroxine 175 MCG tablet Commonly known as:  SYNTHROID Take one tablet by mouth once daily 30 minutes before breakfast for thyroid. What changed:  how much to take  how to take this  when to take this  additional instructions   magic mouthwash w/lidocaine Soln Take 5 mLs by mouth 4 (four) times daily as needed for mouth pain.   Metoprolol Tartrate 75 MG Tabs Take 75  mg by mouth 2 (two) times daily. What changed:  medication strength   multivitamin with minerals Tabs tablet Take 1 tablet by mouth daily.   POLY-IRON 150 150 MG capsule Generic drug:  iron polysaccharides TAKE ONE CAPSULE BY MOUTH DAILY   rasagiline 1 MG Tabs tablet Commonly known as:  AZILECT TAKE ONE TABLET BY MOUTH EVERY DAY   saccharomyces boulardii 250 MG capsule Commonly known as:  FLORASTOR Take 250 mg by mouth 2 (two) times daily.   SYSTANE OP Apply 1 drop to eye 2 (two) times daily as needed (dryness).   Vitamin D3 2000 units Tabs Take 2,000 Units by mouth daily.      Major procedures and Radiology Reports - PLEASE review detailed and final reports for all details, in brief -   Dg Chest Port 1 View  Result Date: 03/26/2016 CLINICAL DATA:  Pulmonary edema. History of lumpectomy for breast cancer. EXAM: PORTABLE CHEST 1 VIEW COMPARISON:  03/25/2016 FINDINGS: Stable mild elevation of the left hemidiaphragm. Indistinct pulmonary vasculature with interstitial accentuation and mild enlargement of the cardiopericardial silhouette. Atherosclerotic calcification of the aortic arch. Thoracic spondylosis. IMPRESSION: 1. Mild enlargement of the cardiopericardial silhouette with pulmonary venous hypertension and likely mild interstitial edema. However, this is less striking than on yesterday 's exam. 2. Chronic mild elevation of the left hemidiaphragm. Electronically Signed   By: Van Clines M.D.   On: 03/26/2016 08:24   Dg Chest Port 1 View  Result Date: 03/25/2016 CLINICAL DATA:  Wheezing. EXAM: PORTABLE CHEST 1 VIEW COMPARISON:  02/12/2015, 12/21/2013 CT 11/30/2013. FINDINGS: Patient is rotated to the right. Cardiomegaly. Diffuse bilateral pulmonary interstitial prominence noted.  Right chronic interstitial lung disease most likely present. Active interstitial process such as interstitial edema and/or pneumonitis cannot be excluded. Stable elevation left hemidiaphragm. No  pneumothorax . IMPRESSION: 1. Cardiomegaly. 2. Chronic interstitial changes with possible active superimposed interstitial process such as interstitial edema or pneumonitis. Electronically Signed   By: Marcello Moores  Register   On: 03/25/2016 12:12   Dg Abd Portable 1v  Result Date: 03/25/2016 CLINICAL DATA:  Abdomen pain EXAM: PORTABLE ABDOMEN - 1 VIEW COMPARISON:  01/25/2015, FINDINGS: Mild air distention O two-view stomach appears bowel-gas pattern nonobstructed with moderate stool. Calcified phleboliths in the pelvis. Surgical clips in the pelvis. Scoliosis of the spine. IMPRESSION: Nonobstructed bowel-gas pattern Electronically Signed   By: Donavan Foil M.D.   On: 03/25/2016 20:33    Micro Results   Recent Results (from the past 240 hour(s))  Culture, Urine     Status: Abnormal   Collection Time: 03/25/16 11:39 AM  Result Value Ref Range Status   Specimen Description URINE, RANDOM  Final   Special Requests NONE  Final   Culture MULTIPLE SPECIES PRESENT, SUGGEST RECOLLECTION (A)  Final   Report Status 03/27/2016 FINAL  Final    Today   Subjective    Victoria Lindsey today has no new c/o, some fatigue persists, no nausea no vomiting no diarrhea . Oral intake needs to be better       Patient has been seen and examined prior to discharge   Objective   Blood pressure (!) 164/64, pulse 89, temperature 97.4 F (36.3 C), resp. rate 20, height 5' (1.524 m), weight 51 kg (112 lb 6.4 oz), SpO2 94 %.   Intake/Output Summary (Last 24 hours) at 03/29/16 1306 Last data filed at 03/29/16 0900  Gross per 24 hour  Intake             1260 ml  Output              250 ml  Net             1010 ml    Exam Gen:- Awake  In no apparent distress  HEENT:- [place].AT,   Neck-Supple Neck,No JVD,  Lungs- mostly clear  CV- S1, S2 normal Abd-  +ve B.Sounds, Abd Soft, No tenderness,    Extremity/Skin:- Intact peripheral pulses   NeuroPsych- no new focal deficits, underlying Parkinson's disorder (baseline)     Data Review   CBC w Diff: Lab Results  Component Value Date   WBC 6.6 03/29/2016   HGB 10.1 (L) 03/29/2016   HGB 12.2 06/26/2008   HCT 32.1 (L) 03/29/2016   HCT 35.4 07/11/2015   HCT 35.7 06/26/2008   PLT 145 (L) 03/29/2016   PLT 132 (L) 07/11/2015   LYMPHOPCT 24 03/25/2016   LYMPHOPCT 35.6 06/26/2008   MONOPCT 11 03/25/2016   MONOPCT 13.8 06/26/2008   EOSPCT 2 03/25/2016   EOSPCT 2.3 06/26/2008   BASOPCT 1 03/25/2016   BASOPCT 0.3 06/26/2008    CMP: Lab Results  Component Value Date   NA 135 03/29/2016   NA 139 04/21/2015   K 4.1 03/29/2016   CL 97 (L) 03/29/2016   CO2 25 03/29/2016   BUN 48 (H) 03/29/2016   BUN 36 (H) 04/21/2015   CREATININE 1.56 (H) 03/29/2016   CREATININE 1.33 (H) 10/24/2015   PROT 7.2 03/25/2016   PROT 7.1 04/21/2015   ALBUMIN 3.4 (L) 03/25/2016   ALBUMIN 3.9 04/21/2015   BILITOT 0.5 03/25/2016   BILITOT 0.4 04/21/2015   ALKPHOS  165 (H) 03/25/2016   AST 54 (H) 03/25/2016   ALT 9 (L) 03/25/2016  .   Total Discharge time is about 33 minutes  Mazin Emma M.D on 03/29/2016 at 1:06 PM  [facility]   Office  [phone]  Dragon dictation system was used to create this note, attempts have been made to correct errors, however presence of uncorrected errors is not a reflection quality of care provided

## 2016-03-29 NOTE — Progress Notes (Signed)
Report given to the admitting nurse named Lenward Chancellor at [facility] facility. All questions answered. Transport already called by Education officer, museum.  Ferdinand Lango, RN

## 2016-03-29 NOTE — Progress Notes (Signed)
Physical Therapy Treatment Patient Details Name: Victoria Lindsey MRN: [id number] DOB: 05/09/1925 Today's Date: 03/29/2016    History of Present Illness Victoria Lindsey is a 83 y.o. female  past medical history significant for hypertension, low thyroid, chronic kidney disease, Parkinson's, prediabetes, admitted for Hypertensive emergency.    PT Comments    Patient progressing slowly towards PT goals. Tolerated short distance ambulation with Mod A for balance/safety. Limited by dyspnea on exertion and fatigue. Tolerated exercises. Pt with decreased cardiovascular endurance and activity tolerance at this time. Pt high fall risk. Continue to recommend SNF. Will follow.   Follow Up Recommendations  SNF;Supervision for mobility/OOB     Equipment Recommendations  None recommended by PT    Recommendations for Other Services       Precautions / Restrictions Precautions Precautions: Fall Precaution Comments: dyspnea Restrictions Weight Bearing Restrictions: No    Mobility  Bed Mobility Overal bed mobility: Needs Assistance Bed Mobility: Supine to Sit     Supine to sit: Min guard     General bed mobility comments: Close guard for safety, cues for technique. Heavy use of rail to elevate trunk and significant time needed to perform.  Transfers Overall transfer level: Needs assistance Equipment used: Rolling walker (2 wheeled) Transfers: Sit to/from Stand Sit to Stand: Min assist         General transfer comment: Min assist for boost to stand and for balance. LOB posteriorly, cues for technique throughout.  Ambulation/Gait Ambulation/Gait assistance: Mod assist Ambulation Distance (Feet): 5 Feet Assistive device: Rolling walker (2 wheeled) Gait Pattern/deviations: Step-to pattern;Step-through pattern;Decreased step length - right;Decreased step length - left;Trunk flexed;Shuffle;Leaning posteriorly;Narrow base of support Gait velocity: decreased   General Gait Details:  Shuffled gait with posterior LOB requiring Mod A to steady x2. 2/4 DOE. Cues for upright and RW proximity. Fatigues quickly.   Stairs            Wheelchair Mobility    Modified Rankin (Stroke Patients Only)       Balance Overall balance assessment: Needs assistance Sitting-balance support: Feet supported;No upper extremity supported Sitting balance-Leahy Scale: Fair     Standing balance support: During functional activity;Bilateral upper extremity supported Standing balance-Leahy Scale: Poor Standing balance comment: Relient on BUEs for support in standing.                    Cognition Arousal/Alertness: Awake/alert Behavior During Therapy: WFL for tasks assessed/performed Overall Cognitive Status: Impaired/Different from baseline Area of Impairment: Problem solving             Problem Solving: Slow processing;Requires verbal cues      Exercises General Exercises - Lower Extremity Ankle Circles/Pumps: AROM;Both;20 reps;Supine Long Arc Quad: Strengthening;Both;10 reps;Seated    General Comments        Pertinent Vitals/Pain Pain Assessment: No/denies pain    Home Living                      Prior Function            PT Goals (current goals can now be found in the care plan section) Progress towards PT goals: Progressing toward goals (slowly)    Frequency    Min 5X/week      PT Plan Current plan remains appropriate    Co-evaluation             End of Session Equipment Utilized During Treatment: Gait belt Activity Tolerance: Patient limited by fatigue Patient left: in  chair;with call bell/phone within reach;with chair alarm set     Time: [phone] PT Time Calculation (min) (ACUTE ONLY): 25 min  Charges:  $Gait Training: 8-22 mins $Therapeutic Exercise: 8-22 mins                    G Codes:      [name] [name] 03/29/2016, 1:18 PM [name], [name], DPT [phone]

## 2016-03-29 NOTE — Clinical Social Work Placement (Signed)
   CLINICAL SOCIAL WORK PLACEMENT  NOTE  Date:  03/29/2016  Patient Details  Name: Victoria Lindsey MRN: [id number] Date of Birth: 03/02/1926  Clinical Social Work is seeking post-discharge placement for this patient at the [place] level of care (*CSW will initial, date and re-position this form in  chart as items are completed):  Yes   Patient/family provided with [facility] Work Department's list of facilities offering this level of care within the geographic area requested by the patient (or if unable, by the patient's family).  Yes   Patient/family informed of their freedom to choose among providers that offer the needed level of care, that participate in Medicare, Medicaid or managed care program needed by the patient, have an available bed and are willing to accept the patient.  Yes   Patient/family informed of [facility]'s ownership interest in [facility] and [facility], as well as of the fact that they are under no obligation to receive care at these facilities.  PASRR submitted to EDS on       PASRR number received on       Existing PASRR number confirmed on 03/28/16     FL2 transmitted to all facilities in geographic area requested by pt/family on 03/28/16     FL2 transmitted to all facilities within larger geographic area on       Patient informed that his/her managed care company has contracts with or will negotiate with certain facilities, including the following:        Yes   Patient/family informed of bed offers received.  Patient chooses bed at [facility]     Physician recommends and patient chooses bed at      Patient to be transferred to [facility] on 03/29/16.  Patient to be transferred to facility by Ambulance     Patient family notified on 03/29/16 of transfer.  Name of family member notified:  [name]     PHYSICIAN Please prepare priority discharge summary, including  medications, Please prepare prescriptions, Please sign FL2     Additional Comment:  Per MD patient ready for DC to [facility]. RN, patient, patient's family, and facility notified of DC. RN given number for report. DC packet on chart. Ambulance transport requested for patient. CSW signing off.   _______________________________________________ Rigoberto Noel, LCSW 03/29/2016, 1:37 PM

## 2016-03-30 ENCOUNTER — Non-Acute Institutional Stay (SKILLED_NURSING_FACILITY): Payer: Medicare Other | Admitting: Internal Medicine

## 2016-03-30 ENCOUNTER — Encounter: Payer: Self-pay | Admitting: Internal Medicine

## 2016-03-30 DIAGNOSIS — N179 Acute kidney failure, unspecified: Secondary | ICD-10-CM

## 2016-03-30 DIAGNOSIS — G20 Parkinson's disease: Secondary | ICD-10-CM

## 2016-03-30 DIAGNOSIS — N390 Urinary tract infection, site not specified: Secondary | ICD-10-CM | POA: Diagnosis not present

## 2016-03-30 DIAGNOSIS — R339 Retention of urine, unspecified: Secondary | ICD-10-CM | POA: Diagnosis not present

## 2016-03-30 DIAGNOSIS — I161 Hypertensive emergency: Secondary | ICD-10-CM

## 2016-03-30 DIAGNOSIS — E034 Atrophy of thyroid (acquired): Secondary | ICD-10-CM

## 2016-03-30 DIAGNOSIS — G20A1 Parkinson's disease without dyskinesia, without mention of fluctuations: Secondary | ICD-10-CM

## 2016-03-30 DIAGNOSIS — E559 Vitamin D deficiency, unspecified: Secondary | ICD-10-CM

## 2016-03-30 DIAGNOSIS — D649 Anemia, unspecified: Secondary | ICD-10-CM

## 2016-03-30 DIAGNOSIS — G459 Transient cerebral ischemic attack, unspecified: Secondary | ICD-10-CM | POA: Diagnosis not present

## 2016-03-30 DIAGNOSIS — F419 Anxiety disorder, unspecified: Secondary | ICD-10-CM

## 2016-03-30 NOTE — Progress Notes (Signed)
: Provider:  Noah Lindsey. Victoria Coil, MD Location:  [facility] Room Number: [id number] Place of Service:  SNF (31)  PCP: Victoria Kinnier, DO Patient Care Team: Victoria Curry, DO as PCP - General (Geriatric Medicine) Victoria Lindsey. Victoria Cancer, MD as Referring Physician (Hematology and Oncology) Victoria Anis, MD as Referring Physician (Neurology) Victoria Merino, MD as Consulting Physician (Rheumatology) Victoria Coco, MD as Consulting Physician (Cardiology) Victoria Natal, MD as Referring Physician (Ophthalmology) Victoria Hawking, MD as Consulting Physician (Neurology) Victoria Sartorius, MD as Referring Physician (Urology) Victoria Delton, MD as Consulting Physician (Pulmonary Disease) Victoria Parish, MD as Consulting Physician (Nephrology)  Extended Emergency Contact Information Primary Emergency Contact: Victoria Lindsey Address: [address]          [place] [address] [name] of [place] Phone: [phone] Mobile Phone: [phone] Relation: Spouse Secondary Emergency Contact: Victoria Lindsey Address: [address]          [place], [place] [address] [name] of [place] Phone: [phone] Mobile Phone: [phone] Relation: Daughter     Allergies: Aciphex [rabeprazole sodium]; Amantadines; Amlodipine; Avapro [irbesartan]; Cardura [doxazosin mesylate]; Ciprofloxacin; Clonidine derivatives; Cozaar; Diltiazem; Doxycycline; Famotidine; Hctz [hydrochlorothiazide]; Indapamide; Lansoprazole; Lisinopril; Loratadine; Maxidex [dexamethasone]; Mirapex [pramipexole dihydrochloride]; Norvasc [amlodipine besylate]; Pantoprazole sodium; Prilosec [omeprazole]; Procardia [nifedipine]; Proton pump inhibitors; Sulfa drugs cross reactors; Trimethoprim; Zyrtec [cetirizine hcl]; Metronidazole; and Penicillins  Chief Complaint  Patient presents with  . New Admit To SNF    Admit to Facility    HPI: Patient is 83 y.o. female with hypertension, low thyroid,  chronic kidney disease, Parkinson's, prediabetes presents emergency room with shortness of breath. History gathered from daughter &b patient. Recently multiple providers have been titrating the patient's blood pressure medication. Daughter noted her systolic blood pressure was in the 200s for a few consecutive days. Patient then began to develop shortness of breath. The original reason for removing patient's blood pressure medications from her regimen was she was complaining of symptoms of orthostatic hypotension on standing. Patient had not sustained any falls due to these symptoms. Pt was admitted to [facility] from 1/18-22 where se was found to have systolic blood pressure over 200 mmhg and clinical and radiological evidence of pulmonary venous hypertension/congestion. Patient subsequently developed AKI most likely due to hypertensive emergency with persistently elevated blood pressures compounded poor oral intake and also by diuresis. Diuretics were discontinued, patient was given gentle hydration. Creatinine initially peaked at 1.76. D/c Cr was 1.56. Pt is admitted to SNF with generalized weakness. While at SNF pt will be followed for hypothyroidism, tx with synthroid, Parkisnson's dx, tx with sinemet and azilct and Vit D deficiency ,tx with replacement.  Past Medical History:  Diagnosis Date  . Anemia   . Breast Lindsey ([place])    LEFT BREAST  . Dizziness   . Hyperglycemia   . Hyperlipidemia   . Hypertension   . Hypophonia   . Hypothyroidism   . Kidney failure    stage 3  . Left ventricular diastolic dysfunction   . Orthostatic hypotension   . Parkinson's disease   . Prediabetes   . TIA (transient ischemic attack)   . Tremor     Past Surgical History:  Procedure Laterality Date  . BREAST LUMPECTOMY  2010  . CARDIOVASCULAR STRESS TEST  06/2007   NORMAL  . CATARACT EXTRACTION  2005,2006   Dr.Devonzo  . CHOLECYSTECTOMY  1994  . NASAL SINUS SURGERY     submucous resection late 1950s  .  TONSILLECTOMY      Allergies as of 03/30/2016      Reactions   Aciphex [rabeprazole Sodium]    unknown   Amantadines    unknown   Amlodipine Other (See Comments)   unknown No reaction listed   Avapro [irbesartan] Other (See Comments)   nightmares unknown unknown   Cardura [doxazosin Mesylate]    unknown   Ciprofloxacin    unknown   Clonidine Derivatives    unknown   Cozaar    nightmares   Diltiazem    unknown   Doxycycline Hives   Famotidine    unknown   Hctz [hydrochlorothiazide]    unknown   Indapamide    unknown   Lansoprazole    unknown   Lisinopril    Mouth problems   Loratadine    unknown   Maxidex [dexamethasone]    unknown   Mirapex [pramipexole Dihydrochloride]    unknown   Norvasc [amlodipine Besylate]    unknown   Pantoprazole Sodium    unknown   Prilosec [omeprazole]    unknown   Procardia [nifedipine] Other (See Comments)   unknown Mouth sores unknown   Proton Pump Inhibitors    unknown   Sulfa Drugs Cross Reactors    unknown   Trimethoprim    unknown   Zyrtec [cetirizine Hcl]    unknown   Metronidazole Rash   Penicillins Rash   Has patient had a PCN reaction causing immediate rash, facial/tongue/throat swelling, SOB or lightheadedness with hypotension: just a rash all over Has patient had a PCN reaction causing severe rash involving mucus membranes or skin necrosis: no Has patient had a PCN reaction that required hospitalization-no Has patient had a PCN reaction occurring within the last 10 years: no, more than 50 yrs ago If all of the above answers are "NO", then may proceed with Cephalosporin use.      Medication List       Accurate as of 03/30/16  9:12 AM. Always use your most recent med list.          aspirin 81 MG tablet Take 81 mg by mouth every evening.   BIOTIN PO Take 1,000 mcg by mouth daily.   Carbidopa-Levodopa ER 25-100 MG tablet controlled release Commonly known as:  SINEMET CR Take 1 tablet by mouth at  bedtime.   carbidopa-levodopa 25-100 MG tablet Commonly known as:  SINEMET IR TAKE ONE TABLET BY MOUTH THREE TIMES DAILY   Cranberry 500 MG Caps Take 500 mg by mouth daily.   docusate sodium 100 MG capsule Commonly known as:  COLACE Take 100 mg by mouth 2 (two) times daily.   Fish Oil 1200 MG Caps Take 1 capsule by mouth every evening.   folic acid [id number] MCG tablet Commonly known as:  FOLVITE Take 800 mcg by mouth daily.   hydrALAZINE 100 MG tablet Commonly known as:  APRESOLINE Take 1 tablet (100 mg total) by mouth 3 (three) times daily.   isosorbide mononitrate 30 MG 24 hr tablet Commonly known as:  IMDUR Take 1 tablet (30 mg total) by mouth daily.   levothyroxine 175 MCG tablet Commonly known as:  SYNTHROID Take one tablet by mouth once daily 30 minutes before breakfast for thyroid.   magic mouthwash w/lidocaine Soln Take 5 mLs by mouth 4 (four) times daily as needed for mouth pain.   Metoprolol Tartrate 75 MG Tabs Take 75 mg by mouth 2 (two) times daily.   multivitamin with minerals Tabs tablet Take 1  tablet by mouth daily.   POLY-IRON 150 150 MG capsule Generic drug:  iron polysaccharides TAKE ONE CAPSULE BY MOUTH DAILY   rasagiline 1 MG Tabs tablet Commonly known as:  AZILECT TAKE ONE TABLET BY MOUTH EVERY DAY   saccharomyces boulardii 250 MG capsule Commonly known as:  FLORASTOR Take 250 mg by mouth 2 (two) times daily.   SYSTANE OP Apply 1 drop to eye 2 (two) times daily as needed (dryness).   Vitamin D3 2000 units Tabs Take 2,000 Units by mouth daily.       No orders of the defined types were placed in this encounter.   Immunization History  Administered Date(s) Administered  . Hepatitis B 07/20/2011  . Influenza Split 11/08/2011  . Influenza,inj,Quad PF,36+ Mos 11/25/2014, 10/24/2015  . Influenza-Unspecified 12/12/2013  . PPD Test 03/29/2016  . Pneumococcal-Unspecified 05/23/2013  . Tdap 02/09/2012  . Zoster 02/09/2012    Social  History  Substance Use Topics  . Smoking status: Former Smoker    Packs/day: 0.50    Years: 12.00    Types: Cigarettes    Quit date: 03/08/1964  . Smokeless tobacco: Never Used  . Alcohol use No    Family history is   Family History  Problem Relation Age of Onset  . Heart disease Mother   . Heart failure Father   . Parkinsonism Father   . Diabetes Father   . COPD Sister   . Lindsey Brother   . Osteoporosis Daughter   . Stroke Sister       Review of Systems  DATA OBTAINED: from patient, daughter GENERAL:  no fevers, fatigue, appetite changes SKIN: No itching, or rash EYES: No eye pain, redness, discharge EARS: No earache, tinnitus, change in hearing NOSE: No congestion, drainage or bleeding  MOUTH/THROAT: No mouth or tooth pain, No sore throat RESPIRATORY: No cough, wheezing, SOB CARDIAC: No chest pain, palpitations, lower extremity edema  GI: No abdominal pain, No N/V/D or constipation, No heartburn or reflux  GU: No dysuria, frequency or urgency, or incontinence  MUSCULOSKELETAL: No unrelieved bone/joint pain NEUROLOGIC: No headache, dizziness or focal weakness PSYCHIATRIC: No c/o anxiety or sadness   Vitals:   03/30/16 0900  BP: 121/61  Pulse: 65  Resp: 18  Temp: 97.1 F (36.2 C)    SpO2 Readings from Last 1 Encounters:  03/30/16 92%   Body mass index is 21.48 kg/m.     Physical Exam  GENERAL APPEARANCE: Alert, min conversant,  No acute distress.  SKIN: No diaphoresis rash HEAD: Normocephalic, atraumatic  EYES: Conjunctiva/lids clear. Pupils round, reactive. EOMs intact.  EARS: External exam WNL, canals clear. Hearing grossly normal.  NOSE: No deformity or discharge.  MOUTH/THROAT: Lips w/o lesions  RESPIRATORY: Breathing is even, unlabored. Lung sounds are clear   CARDIOVASCULAR: Heart RRR no murmurs, rubs or gallops. No peripheral edema.   GASTROINTESTINAL: Abdomen is soft, non-tender, not distended w/ normal bowel sounds. GENITOURINARY:  Bladder non tender, not distended  MUSCULOSKELETAL: No abnormal joints or musculature NEUROLOGIC:  Cranial nerves 2-12 grossly intact. Moves all extremities  PSYCHIATRIC: Mood and affect appropriate to situation, no behavioral issues  Patient Active Problem List   Diagnosis Date Noted  . AKI (acute kidney injury)/ on CKD 03/28/2016  . Hypertensive emergency 03/25/2016  . Orthostatic hypotension 12/29/2015  . Compression fracture of lumbar vertebra ([place]) 08/04/2015  . Avitaminosis D 08/04/2015  . Venous insufficiency 07/11/2015  . Pressure sore on ankle 07/11/2015  . Falls frequently 07/11/2015  . Pain in  lower limb 04/17/2015  . Chronic constipation 03/24/2015  . Hypothyroidism due to acquired atrophy of thyroid 03/24/2015  . Herpes labialis 02/23/2015  . Dysphagia 02/13/2015  . Diarrhea 02/12/2015  . Onychomycosis 01/17/2015  . Folliculitis [id number]  . Encephalopathy, hypertensive 05/29/2014  . Dizziness of unknown cause 02/21/2014  . Left ventricular diastolic dysfunction with preserved systolic function ([place]) [id number]  . Hypertensive heart disease with CHF (congestive heart failure) ([place]) 12/30/2013  . Transaminitis 12/24/2013  . Palpitations 12/23/2013  . Respiratory insufficiency 12/22/2013  . Normocytic anemia 12/21/2013  . Thrombocytopenia ([place]) 12/21/2013  . Abnormal ECG 12/15/2013  . Abnormal involuntary movement 12/15/2013  . Colorectal polyps 12/15/2013  . Chest pain 12/15/2013  . Arteriosclerosis of coronary artery 12/15/2013  . Chronic kidney disease (CKD), stage III (moderate) 12/15/2013  . Corn 12/15/2013  . Diabetes ([place]) 12/15/2013  . Breathing difficult 12/15/2013  . Acid reflux 12/15/2013  . HLD (hyperlipidemia) 12/15/2013  . BP (high blood pressure) 12/15/2013  . FOM (frequency of micturition) 12/15/2013  . Breast Lindsey, female ([place]) 12/15/2013  . Fungal infection of nail 12/15/2013  . Osteopenia 12/15/2013  . Raynaud's syndrome 12/15/2013  .  Restless leg 12/15/2013  . Gougerout-Sjoegren syndrome ([place]) 12/15/2013  . Change in blood platelet count 12/15/2013  . Infection of urinary tract 12/15/2013  . Paroxysmal digital cyanosis 12/15/2013  . Lower urinary tract infection 10/12/2013  . Idiopathic Parkinson's disease ([place]) 09/06/2013  . Bladder spasm 09/05/2013  . Urgency of micturation 09/05/2013  . Acute on chronic diastolic (congestive) heart failure 06/15/2012  . Hyperglycemia 12/27/2011  . Sjogren's syndrome ([place]) 12/17/2010  . TIA (transient ischemic attack) 12/17/2010  . Benign hypertensive heart disease without heart failure 09/01/2010  . Parkinson's disease ([place]) 09/01/2010  . Hypothyroidism 09/01/2010  . Hypercholesterolemia 09/01/2010  . Chronic anemia 09/01/2010      Labs reviewed: Basic Metabolic Panel:    Component Value Date/Time   NA 135 03/29/2016 0954   NA 135 (A) 03/29/2016   K 4.1 03/29/2016 0954   CL 97 (L) 03/29/2016 0954   CO2 25 03/29/2016 0954   GLUCOSE 176 (H) 03/29/2016 0954   BUN 48 (H) 03/29/2016 0954   BUN 48 (A) 03/29/2016   CREATININE 1.56 (H) 03/29/2016 0954   CREATININE 1.33 (H) 10/24/2015 1215   CALCIUM 8.9 03/29/2016 0954   PROT 7.2 03/25/2016 1155   PROT 7.1 04/21/2015 0929   ALBUMIN 3.4 (L) 03/25/2016 1155   ALBUMIN 3.9 04/21/2015 0929   AST 54 (H) 03/25/2016 1155   ALT 9 (L) 03/25/2016 1155   ALKPHOS 165 (H) 03/25/2016 1155   BILITOT 0.5 03/25/2016 1155   BILITOT 0.4 04/21/2015 0929   GFRNONAA 29 (L) 03/29/2016 0954   GFRNONAA 35 (L) 10/21/2015 0909   GFRAA 33 (L) 03/29/2016 0954   GFRAA 40 (L) 10/21/2015 0909     Recent Labs  03/25/16 1551  03/28/16 0351 03/29/16 03/29/16 0351 03/29/16 0954  NA  --   < > 134* 135* 132* 135  K  --   < > 4.5  --  4.2 4.1  CL  --   < > 95*  --  98* 97*  CO2  --   < > 29  --  25 25  GLUCOSE  --   < > 102*  --  98 176*  BUN  --   < > 47* 48* 44* 48*  CREATININE 1.19*  1.2*  < > 1.76* 1.6* 1.56* 1.56*  CALCIUM  --   < >  9.1   --  8.6* 8.9  MG 2.0  --   --   --   --   --   PHOS 4.1  --   --   --   --   --   < > = values in this interval not displayed. Liver Function Tests:  Recent Labs  04/21/15 0929 10/21/15 0909 03/25/16 1155  AST 44* 50* 54*  ALT 36* 30* 9*  ALKPHOS 166* 150* 165*  BILITOT 0.4 0.6 0.5  PROT 7.1 6.9 7.2  ALBUMIN 3.9 3.8 3.4*   No results for input(s): LIPASE, AMYLASE in the last 8760 hours. No results for input(s): AMMONIA in the last 8760 hours. CBC:  Recent Labs  10/21/15 0909 03/25/16 1155  03/27/16 0320 03/28/16 0351 03/29/16 03/29/16 0351  WBC 7.4 9.0  9.0  < > 7.2 6.8 6.6 6.6  NEUTROABS 4,292 6  5.5  --   --   --   --   --   HGB 12.2 10.7*  10.7*  < > 10.4* 10.2*  --  10.1*  HCT 37.8 34.1*  34*  < > 32.8* 32.4*  --  32.1*  MCV 99.5 106.6*  < > 105.1* 105.5*  --  104.9*  PLT 153 143*  143*  < > 138* 138*  --  145*  < > = values in this interval not displayed. Lipid  Recent Labs  10/21/15 0909  CHOL 200  HDL 40*  LDLCALC 125  TRIG 175*    Cardiac Enzymes:  Recent Labs  03/25/16 1551 03/25/16 2219 03/26/16 0333  TROPONINI 0.04* 0.04* 0.05*   BNP:  Recent Labs  03/25/16 1155  BNP 1,464.0*   No results found for: [facility] Lab Results  Component Value Date   HGBA1C 6.2 (H) 10/21/2015   Lab Results  Component Value Date   TSH 2.78 10/21/2015   Lab Results  Component Value Date   [id number] 12/21/2013   Lab Results  Component Value Date   FOLATE >20.0 12/21/2013   Lab Results  Component Value Date   IRON 68 12/21/2013   TIBC 219 (L) 12/21/2013   FERRITIN 145 12/21/2013    Imaging and Procedures obtained prior to SNF admission: Dg Chest Port 1 View  Result Date: 03/26/2016 CLINICAL DATA:  Pulmonary edema. History of lumpectomy for breast Lindsey. EXAM: PORTABLE CHEST 1 VIEW COMPARISON:  03/25/2016 FINDINGS: Stable mild elevation of the left hemidiaphragm. Indistinct pulmonary vasculature with interstitial accentuation and  mild enlargement of the cardiopericardial silhouette. Atherosclerotic calcification of the aortic arch. Thoracic spondylosis. IMPRESSION: 1. Mild enlargement of the cardiopericardial silhouette with pulmonary venous hypertension and likely mild interstitial edema. However, this is less striking than on yesterday 's exam. 2. Chronic mild elevation of the left hemidiaphragm. Electronically Signed   By: Van Clines M.D.   On: 03/26/2016 08:24   Dg Chest Port 1 View  Result Date: 03/25/2016 CLINICAL DATA:  Wheezing. EXAM: PORTABLE CHEST 1 VIEW COMPARISON:  02/12/2015, 12/21/2013 CT 11/30/2013. FINDINGS: Patient is rotated to the right. Cardiomegaly. Diffuse bilateral pulmonary interstitial prominence noted. Right chronic interstitial lung disease most likely present. Active interstitial process such as interstitial edema and/or pneumonitis cannot be excluded. Stable elevation left hemidiaphragm. No pneumothorax . IMPRESSION: 1. Cardiomegaly. 2. Chronic interstitial changes with possible active superimposed interstitial process such as interstitial edema or pneumonitis. Electronically Signed   By: Marcello Moores  Register   On: 03/25/2016 12:12   Dg Abd Portable 1v  Result Date: 03/25/2016 CLINICAL DATA:  Abdomen pain EXAM: PORTABLE ABDOMEN - 1 VIEW COMPARISON:  01/25/2015, FINDINGS: Mild air distention O two-view stomach appears bowel-gas pattern nonobstructed with moderate stool. Calcified phleboliths in the pelvis. Surgical clips in the pelvis. Scoliosis of the spine. IMPRESSION: Nonobstructed bowel-gas pattern Electronically Signed   By: Donavan Foil M.D.   On: 03/25/2016 20:33     Not all labs, radiology exams or other studies done during hospitalization come through on my EPIC note; however they are reviewed by me.    Assessment and Plan  HYPERTENSIVE EMERGENCY - presented with SBP in the 200's with flash pulmonary edema; improved with diuretics and inc BP meds;oddly pt's meds ha been decreased  2/2 orthostatic hypotension; Imdur and metoprolol unchanged but hydralazine inc to 100 mg TID; pt had intolerance ro norvasc in past SNF - will cont Imdur 30 mg daily, metoprolol 75 mg BID and Hydralazine 100 mg TID, no lasix for the moment  ACUTE ON CKD - Cr PEAKED AT 1.76; diuretics were stopped and pt hydrated; drinking fluids was encouraged; d/c Cr 1.56 SNF - plan to repeat BMP 1/25 if Cr improved then will start lasix 20 mg daily  URINARY RETENTION /FREQUENT UTI'S- was tx wit IV abx for 2 days but urine returned with no growth so they were stopped SNF - out pt urology 2/2 retenrion and recurrent UTIs; cont daily cranberry  HYPOTHYROIDISM SNF - stable; cont synthroid 175 mcg daily  [facility]IN[facility]S DX SNF- stable; cont sinemet 25-100 QID and azilect 1 mg daily  CHRONIC ANEMIA - d/c Hb 10.1 SNF - will f/u CBC  HX TIA'S SNF - cont ASA 81 mg daily  VIT D DEF SNF- cont replacement 2000u daily  ANXIETY SNF - nursing has asked for pt o have something, her anxiety interfering with PT/OT;plan xanax 0.25 mg BID   Time spent > 45 min;> 50% of time with patient was spent reviewing records, labs, tests and studies, counseling and developing plan of care  Webb Silversmith D. Victoria Coil, MD

## 2016-03-31 ENCOUNTER — Telehealth: Payer: Self-pay

## 2016-03-31 ENCOUNTER — Encounter: Payer: Self-pay | Admitting: Internal Medicine

## 2016-03-31 DIAGNOSIS — N390 Urinary tract infection, site not specified: Secondary | ICD-10-CM | POA: Insufficient documentation

## 2016-03-31 DIAGNOSIS — F419 Anxiety disorder, unspecified: Secondary | ICD-10-CM | POA: Insufficient documentation

## 2016-03-31 DIAGNOSIS — R339 Retention of urine, unspecified: Secondary | ICD-10-CM | POA: Insufficient documentation

## 2016-03-31 NOTE — Telephone Encounter (Signed)
This is a patient of [facility], who was admitted to [facility] after hospitalization. [facility] F/U is needed. Hospital discharge from [facility] on 03/29/16.

## 2016-04-02 ENCOUNTER — Inpatient Hospital Stay (HOSPITAL_COMMUNITY): Payer: Medicare Other

## 2016-04-02 ENCOUNTER — Encounter (HOSPITAL_COMMUNITY): Payer: Self-pay | Admitting: Emergency Medicine

## 2016-04-02 ENCOUNTER — Emergency Department (HOSPITAL_COMMUNITY): Payer: Medicare Other

## 2016-04-02 ENCOUNTER — Inpatient Hospital Stay (HOSPITAL_COMMUNITY)
Admission: EM | Admit: 2016-04-02 | Discharge: 2016-04-08 | DRG: 871 | Disposition: E | Payer: Medicare Other | Attending: Pulmonary Disease | Admitting: Pulmonary Disease

## 2016-04-02 ENCOUNTER — Ambulatory Visit (HOSPITAL_COMMUNITY)
Admission: EM | Admit: 2016-04-02 | Discharge: 2016-04-02 | Disposition: A | Discharge status: Home / Self Care | Payer: No Typology Code available for payment source | Source: Ambulatory Visit | Attending: Emergency Medicine | Admitting: Emergency Medicine

## 2016-04-02 DIAGNOSIS — S40021A Contusion of right upper arm, initial encounter: Secondary | ICD-10-CM | POA: Diagnosis present

## 2016-04-02 DIAGNOSIS — Z0441 Encounter for examination and observation following alleged adult rape: Secondary | ICD-10-CM | POA: Diagnosis present

## 2016-04-02 DIAGNOSIS — X58XXXA Exposure to other specified factors, initial encounter: Secondary | ICD-10-CM | POA: Diagnosis present

## 2016-04-02 DIAGNOSIS — E875 Hyperkalemia: Secondary | ICD-10-CM | POA: Diagnosis present

## 2016-04-02 DIAGNOSIS — R739 Hyperglycemia, unspecified: Secondary | ICD-10-CM | POA: Diagnosis present

## 2016-04-02 DIAGNOSIS — Z823 Family history of stroke: Secondary | ICD-10-CM

## 2016-04-02 DIAGNOSIS — I129 Hypertensive chronic kidney disease with stage 1 through stage 4 chronic kidney disease, or unspecified chronic kidney disease: Secondary | ICD-10-CM | POA: Diagnosis present

## 2016-04-02 DIAGNOSIS — N183 Chronic kidney disease, stage 3 (moderate): Secondary | ICD-10-CM | POA: Diagnosis present

## 2016-04-02 DIAGNOSIS — R6521 Severe sepsis with septic shock: Secondary | ICD-10-CM | POA: Diagnosis present

## 2016-04-02 DIAGNOSIS — Z4682 Encounter for fitting and adjustment of non-vascular catheter: Secondary | ICD-10-CM | POA: Diagnosis not present

## 2016-04-02 DIAGNOSIS — J9601 Acute respiratory failure with hypoxia: Secondary | ICD-10-CM | POA: Diagnosis not present

## 2016-04-02 DIAGNOSIS — Z8249 Family history of ischemic heart disease and other diseases of the circulatory system: Secondary | ICD-10-CM

## 2016-04-02 DIAGNOSIS — S3023XA Contusion of vagina and vulva, initial encounter: Secondary | ICD-10-CM | POA: Diagnosis present

## 2016-04-02 DIAGNOSIS — R402431 Glasgow coma scale score 3-8, in the field [EMT or ambulance]: Secondary | ICD-10-CM

## 2016-04-02 DIAGNOSIS — Y95 Nosocomial condition: Secondary | ICD-10-CM | POA: Diagnosis present

## 2016-04-02 DIAGNOSIS — R402212 Coma scale, best verbal response, none, at arrival to emergency department: Secondary | ICD-10-CM | POA: Diagnosis not present

## 2016-04-02 DIAGNOSIS — Z87891 Personal history of nicotine dependence: Secondary | ICD-10-CM

## 2016-04-02 DIAGNOSIS — I469 Cardiac arrest, cause unspecified: Secondary | ICD-10-CM | POA: Diagnosis not present

## 2016-04-02 DIAGNOSIS — Z888 Allergy status to other drugs, medicaments and biological substances status: Secondary | ICD-10-CM

## 2016-04-02 DIAGNOSIS — F419 Anxiety disorder, unspecified: Secondary | ICD-10-CM | POA: Diagnosis present

## 2016-04-02 DIAGNOSIS — E872 Acidosis, unspecified: Secondary | ICD-10-CM

## 2016-04-02 DIAGNOSIS — N179 Acute kidney failure, unspecified: Secondary | ICD-10-CM | POA: Diagnosis present

## 2016-04-02 DIAGNOSIS — D696 Thrombocytopenia, unspecified: Secondary | ICD-10-CM | POA: Diagnosis present

## 2016-04-02 DIAGNOSIS — Z881 Allergy status to other antibiotic agents status: Secondary | ICD-10-CM

## 2016-04-02 DIAGNOSIS — Z66 Do not resuscitate: Secondary | ICD-10-CM | POA: Diagnosis not present

## 2016-04-02 DIAGNOSIS — A419 Sepsis, unspecified organism: Secondary | ICD-10-CM | POA: Diagnosis not present

## 2016-04-02 DIAGNOSIS — J211 Acute bronchiolitis due to human metapneumovirus: Secondary | ICD-10-CM | POA: Diagnosis not present

## 2016-04-02 DIAGNOSIS — R0609 Other forms of dyspnea: Secondary | ICD-10-CM | POA: Diagnosis not present

## 2016-04-02 DIAGNOSIS — Z833 Family history of diabetes mellitus: Secondary | ICD-10-CM

## 2016-04-02 DIAGNOSIS — Z880 Allergy status to penicillin: Secondary | ICD-10-CM

## 2016-04-02 DIAGNOSIS — S1093XA Contusion of unspecified part of neck, initial encounter: Secondary | ICD-10-CM | POA: Diagnosis present

## 2016-04-02 DIAGNOSIS — Z452 Encounter for adjustment and management of vascular access device: Secondary | ICD-10-CM

## 2016-04-02 DIAGNOSIS — Z820 Family history of epilepsy and other diseases of the nervous system: Secondary | ICD-10-CM

## 2016-04-02 DIAGNOSIS — R34 Anuria and oliguria: Secondary | ICD-10-CM | POA: Diagnosis not present

## 2016-04-02 DIAGNOSIS — G20 Parkinson's disease: Secondary | ICD-10-CM | POA: Diagnosis present

## 2016-04-02 DIAGNOSIS — R7303 Prediabetes: Secondary | ICD-10-CM | POA: Diagnosis present

## 2016-04-02 DIAGNOSIS — R402312 Coma scale, best motor response, none, at arrival to emergency department: Secondary | ICD-10-CM | POA: Diagnosis present

## 2016-04-02 DIAGNOSIS — S40022A Contusion of left upper arm, initial encounter: Secondary | ICD-10-CM | POA: Diagnosis present

## 2016-04-02 DIAGNOSIS — Z9049 Acquired absence of other specified parts of digestive tract: Secondary | ICD-10-CM

## 2016-04-02 DIAGNOSIS — R531 Weakness: Secondary | ICD-10-CM | POA: Diagnosis not present

## 2016-04-02 DIAGNOSIS — Z853 Personal history of malignant neoplasm of breast: Secondary | ICD-10-CM

## 2016-04-02 DIAGNOSIS — R29810 Facial weakness: Secondary | ICD-10-CM | POA: Diagnosis present

## 2016-04-02 DIAGNOSIS — R402112 Coma scale, eyes open, never, at arrival to emergency department: Secondary | ICD-10-CM | POA: Diagnosis not present

## 2016-04-02 DIAGNOSIS — G931 Anoxic brain damage, not elsewhere classified: Secondary | ICD-10-CM | POA: Diagnosis not present

## 2016-04-02 DIAGNOSIS — J189 Pneumonia, unspecified organism: Secondary | ICD-10-CM | POA: Diagnosis present

## 2016-04-02 DIAGNOSIS — R4182 Altered mental status, unspecified: Secondary | ICD-10-CM | POA: Diagnosis present

## 2016-04-02 DIAGNOSIS — E039 Hypothyroidism, unspecified: Secondary | ICD-10-CM | POA: Diagnosis present

## 2016-04-02 DIAGNOSIS — Z79899 Other long term (current) drug therapy: Secondary | ICD-10-CM

## 2016-04-02 DIAGNOSIS — G9340 Encephalopathy, unspecified: Secondary | ICD-10-CM | POA: Diagnosis not present

## 2016-04-02 DIAGNOSIS — E785 Hyperlipidemia, unspecified: Secondary | ICD-10-CM | POA: Diagnosis present

## 2016-04-02 DIAGNOSIS — Z8673 Personal history of transient ischemic attack (TIA), and cerebral infarction without residual deficits: Secondary | ICD-10-CM

## 2016-04-02 DIAGNOSIS — Z7982 Long term (current) use of aspirin: Secondary | ICD-10-CM

## 2016-04-02 LAB — URINALYSIS, ROUTINE W REFLEX MICROSCOPIC
BILIRUBIN URINE: NEGATIVE
Glucose, UA: 50 mg/dL — AB
KETONES UR: NEGATIVE mg/dL
Leukocytes, UA: NEGATIVE
Nitrite: NEGATIVE
Protein, ur: 100 mg/dL — AB
Specific Gravity, Urine: 1.015 (ref 1.005–1.030)
pH: 5 (ref 5.0–8.0)

## 2016-04-02 LAB — I-STAT ARTERIAL BLOOD GAS, ED
ACID-BASE DEFICIT: 11 mmol/L — AB (ref 0.0–2.0)
Bicarbonate: 18 mmol/L — ABNORMAL LOW (ref 20.0–28.0)
O2 SAT: 100 %
PH ART: 7.187 — AB (ref 7.350–7.450)
Patient temperature: 90.01
TCO2: 20 mmol/L (ref 0–100)
pCO2 arterial: 44.3 mmHg (ref 32.0–48.0)
pO2, Arterial: 453 mmHg — ABNORMAL HIGH (ref 83.0–108.0)

## 2016-04-02 LAB — PROCALCITONIN: Procalcitonin: 0.26 ng/mL

## 2016-04-02 LAB — APTT
APTT: 82 s — AB (ref 24–36)
APTT: 53 s — AB (ref 24–36)

## 2016-04-02 LAB — POCT I-STAT 3, ART BLOOD GAS (G3+)
ACID-BASE DEFICIT: 11 mmol/L — AB (ref 0.0–2.0)
BICARBONATE: 16.3 mmol/L — AB (ref 20.0–28.0)
O2 SAT: 100 %
PH ART: 7.216 — AB (ref 7.350–7.450)
TCO2: 17 mmol/L (ref 0–100)
pCO2 arterial: 40.2 mmHg (ref 32.0–48.0)
pO2, Arterial: 534 mmHg — ABNORMAL HIGH (ref 83.0–108.0)

## 2016-04-02 LAB — POCT I-STAT, CHEM 8
BUN: 68 mg/dL — ABNORMAL HIGH (ref 6–20)
BUN: 72 mg/dL — ABNORMAL HIGH (ref 6–20)
CALCIUM ION: 1.1 mmol/L — AB (ref 1.15–1.40)
CHLORIDE: 104 mmol/L (ref 101–111)
CHLORIDE: 104 mmol/L (ref 101–111)
CREATININE: 2.2 mg/dL — AB (ref 0.44–1.00)
CREATININE: 1.9 mg/dL — AB (ref 0.44–1.00)
Calcium, Ion: 1.09 mmol/L — ABNORMAL LOW (ref 1.15–1.40)
GLUCOSE: 343 mg/dL — AB (ref 65–99)
Glucose, Bld: 307 mg/dL — ABNORMAL HIGH (ref 65–99)
HCT: 26 % — ABNORMAL LOW (ref 36.0–46.0)
HEMATOCRIT: 28 % — AB (ref 36.0–46.0)
Hemoglobin: 8.8 g/dL — ABNORMAL LOW (ref 12.0–15.0)
Hemoglobin: 9.5 g/dL — ABNORMAL LOW (ref 12.0–15.0)
POTASSIUM: 5.5 mmol/L — AB (ref 3.5–5.1)
POTASSIUM: 5.2 mmol/L — AB (ref 3.5–5.1)
SODIUM: 131 mmol/L — AB (ref 135–145)
Sodium: 132 mmol/L — ABNORMAL LOW (ref 135–145)
TCO2: 15 mmol/L (ref 0–100)
TCO2: 14 mmol/L (ref 0–100)

## 2016-04-02 LAB — CBC WITH DIFFERENTIAL/PLATELET
BASOS ABS: 0 10*3/uL (ref 0.0–0.1)
BASOS PCT: 0 %
EOS PCT: 0 %
Eosinophils Absolute: 0 10*3/uL (ref 0.0–0.7)
HEMATOCRIT: 32.7 % — AB (ref 36.0–46.0)
HEMOGLOBIN: 9.8 g/dL — AB (ref 12.0–15.0)
LYMPHS PCT: 24 %
Lymphs Abs: 2.4 10*3/uL (ref 0.7–4.0)
MCH: 32.9 pg (ref 26.0–34.0)
MCHC: 30 g/dL (ref 30.0–36.0)
MCV: 109.7 fL — ABNORMAL HIGH (ref 78.0–100.0)
MONOS PCT: 6 %
Monocytes Absolute: 0.6 10*3/uL (ref 0.1–1.0)
NEUTROS ABS: 6.9 10*3/uL (ref 1.7–7.7)
Neutrophils Relative %: 70 %
Platelets: 144 10*3/uL — ABNORMAL LOW (ref 150–400)
RBC: 2.98 MIL/uL — ABNORMAL LOW (ref 3.87–5.11)
RDW: 17 % — ABNORMAL HIGH (ref 11.5–15.5)
WBC: 9.9 10*3/uL (ref 4.0–10.5)

## 2016-04-02 LAB — RESPIRATORY PANEL BY PCR
Adenovirus: NOT DETECTED
BORDETELLA PERTUSSIS-RVPCR: NOT DETECTED
CHLAMYDOPHILA PNEUMONIAE-RVPPCR: NOT DETECTED
Coronavirus 229E: NOT DETECTED
Coronavirus HKU1: NOT DETECTED
Coronavirus NL63: NOT DETECTED
Coronavirus OC43: NOT DETECTED
INFLUENZA A-RVPPCR: NOT DETECTED
Influenza B: NOT DETECTED
Metapneumovirus: DETECTED — AB
Mycoplasma pneumoniae: NOT DETECTED
PARAINFLUENZA VIRUS 2-RVPPCR: NOT DETECTED
PARAINFLUENZA VIRUS 3-RVPPCR: NOT DETECTED
PARAINFLUENZA VIRUS 4-RVPPCR: NOT DETECTED
Parainfluenza Virus 1: NOT DETECTED
RESPIRATORY SYNCYTIAL VIRUS-RVPPCR: NOT DETECTED
RHINOVIRUS / ENTEROVIRUS - RVPPCR: NOT DETECTED

## 2016-04-02 LAB — I-STAT TROPONIN, ED: Troponin i, poc: 0.17 ng/mL (ref 0.00–0.08)

## 2016-04-02 LAB — BASIC METABOLIC PANEL
Anion gap: 15 (ref 5–15)
Anion gap: 14 (ref 5–15)
BUN: 62 mg/dL — AB (ref 6–20)
BUN: 65 mg/dL — AB (ref 6–20)
CHLORIDE: 101 mmol/L (ref 101–111)
CO2: 13 mmol/L — AB (ref 22–32)
CO2: 14 mmol/L — AB (ref 22–32)
Calcium: 7.4 mg/dL — ABNORMAL LOW (ref 8.9–10.3)
Calcium: 7.4 mg/dL — ABNORMAL LOW (ref 8.9–10.3)
Chloride: 104 mmol/L (ref 101–111)
Creatinine, Ser: 1.84 mg/dL — ABNORMAL HIGH (ref 0.44–1.00)
Creatinine, Ser: 2.03 mg/dL — ABNORMAL HIGH (ref 0.44–1.00)
GFR calc Af Amer: 27 mL/min — ABNORMAL LOW (ref >60)
GFR calc Af Amer: 24 mL/min — ABNORMAL LOW (ref >60)
GFR, EST NON AFRICAN AMERICAN: 23 mL/min — AB (ref >60)
GFR, EST NON AFRICAN AMERICAN: 21 mL/min — AB (ref >60)
GLUCOSE: 127 mg/dL — AB (ref 65–99)
GLUCOSE: 443 mg/dL — AB (ref 65–99)
POTASSIUM: 6.4 mmol/L — AB (ref 3.5–5.1)
POTASSIUM: 6.1 mmol/L — AB (ref 3.5–5.1)
Sodium: 132 mmol/L — ABNORMAL LOW (ref 135–145)
Sodium: 129 mmol/L — ABNORMAL LOW (ref 135–145)

## 2016-04-02 LAB — I-STAT CHEM 8, ED
BUN: 90 mg/dL — AB (ref 6–20)
CALCIUM ION: 1.06 mmol/L — AB (ref 1.15–1.40)
Chloride: 101 mmol/L (ref 101–111)
Creatinine, Ser: 2.1 mg/dL — ABNORMAL HIGH (ref 0.44–1.00)
Glucose, Bld: 55 mg/dL — ABNORMAL LOW (ref 65–99)
HEMATOCRIT: 32 % — AB (ref 36.0–46.0)
Hemoglobin: 10.9 g/dL — ABNORMAL LOW (ref 12.0–15.0)
Potassium: 6.9 mmol/L (ref 3.5–5.1)
Sodium: 132 mmol/L — ABNORMAL LOW (ref 135–145)
TCO2: 22 mmol/L (ref 0–100)

## 2016-04-02 LAB — SURGICAL PCR SCREEN
MRSA, PCR: NEGATIVE
Staphylococcus aureus: NEGATIVE

## 2016-04-02 LAB — I-STAT CG4 LACTIC ACID, ED: LACTIC ACID, VENOUS: 6.67 mmol/L — AB (ref 0.5–1.9)

## 2016-04-02 LAB — LACTIC ACID, PLASMA
LACTIC ACID, VENOUS: 7.4 mmol/L — AB (ref 0.5–1.9)
Lactic Acid, Venous: 6.4 mmol/L (ref 0.5–1.9)

## 2016-04-02 LAB — GLUCOSE, CAPILLARY
GLUCOSE-CAPILLARY: 331 mg/dL — AB (ref 65–99)
GLUCOSE-CAPILLARY: 312 mg/dL — AB (ref 65–99)
Glucose-Capillary: 404 mg/dL — ABNORMAL HIGH (ref 65–99)
Glucose-Capillary: 304 mg/dL — ABNORMAL HIGH (ref 65–99)
Glucose-Capillary: 281 mg/dL — ABNORMAL HIGH (ref 65–99)

## 2016-04-02 LAB — TSH: TSH: 1.878 u[IU]/mL (ref 0.350–4.500)

## 2016-04-02 LAB — PROTIME-INR
INR: 2.09
INR: 2.04
PROTHROMBIN TIME: 23.4 s — AB (ref 11.4–15.2)
Prothrombin Time: 23.8 seconds — ABNORMAL HIGH (ref 11.4–15.2)

## 2016-04-02 LAB — TROPONIN I
TROPONIN I: 0.27 ng/mL — AB (ref <0.03)
TROPONIN I: 0.5 ng/mL — AB (ref <0.03)

## 2016-04-02 LAB — CBG MONITORING, ED: GLUCOSE-CAPILLARY: 58 mg/dL — AB (ref 65–99)

## 2016-04-02 LAB — BRAIN NATRIURETIC PEPTIDE: B Natriuretic Peptide: 2176.3 pg/mL — ABNORMAL HIGH (ref 0.0–100.0)

## 2016-04-02 MED ORDER — FENTANYL CITRATE (PF) 100 MCG/2ML IJ SOLN
50.0000 ug | Freq: Once | INTRAMUSCULAR | Status: AC
  Administered 2016-04-02: 50 ug via INTRAVENOUS

## 2016-04-02 MED ORDER — SODIUM POLYSTYRENE SULFONATE 15 GM/60ML PO SUSP: 15.0000 g | Freq: Once | ORAL | Status: DC

## 2016-04-02 MED ORDER — FENTANYL BOLUS VIA INFUSION
25.0000 ug | INTRAVENOUS | Status: DC | PRN
  Filled 2016-04-02: qty 25

## 2016-04-02 MED ORDER — ORAL CARE MOUTH RINSE
15.0000 mL | OROMUCOSAL | Status: DC
  Administered 2016-04-02 – 2016-04-03 (×8): 15 mL via OROMUCOSAL

## 2016-04-02 MED ORDER — ETOMIDATE 2 MG/ML IV SOLN
INTRAVENOUS | Status: DC | PRN
  Administered 2016-04-02: 20 mg via INTRAVENOUS

## 2016-04-02 MED ORDER — ARTIFICIAL TEARS OP OINT
1.0000 "application " | TOPICAL_OINTMENT | Freq: Three times a day (TID) | OPHTHALMIC | Status: DC
  Administered 2016-04-02 – 2016-04-03 (×3): 1 via OPHTHALMIC
  Filled 2016-04-02: qty 3.5

## 2016-04-02 MED ORDER — SODIUM CHLORIDE 0.9 % IV SOLN
INTRAVENOUS | Status: DC
  Administered 2016-04-02 – 2016-04-03 (×2): via INTRAVENOUS

## 2016-04-02 MED ORDER — IPRATROPIUM-ALBUTEROL 0.5-2.5 (3) MG/3ML IN SOLN
3.0000 mL | Freq: Four times a day (QID) | RESPIRATORY_TRACT | Status: DC
Start: 2016-04-02 — End: 2016-04-03
  Administered 2016-04-02 – 2016-04-03 (×3): 3 mL via RESPIRATORY_TRACT
  Filled 2016-04-02 (×3): qty 3

## 2016-04-02 MED ORDER — DEXTROSE 5 % IV SOLN
1.0000 g | INTRAVENOUS | Status: DC
  Administered 2016-04-03: 1 g via INTRAVENOUS
  Filled 2016-04-02: qty 1

## 2016-04-02 MED ORDER — SUCRALFATE 1 GM/10ML PO SUSP
1.0000 g | Freq: Two times a day (BID) | ORAL | Status: DC
  Administered 2016-04-02 – 2016-04-03 (×2): 1 g
  Filled 2016-04-02 (×3): qty 10

## 2016-04-02 MED ORDER — ROCURONIUM BROMIDE 50 MG/5ML IV SOLN
INTRAVENOUS | Status: DC | PRN
  Administered 2016-04-02: 80 mg via INTRAVENOUS

## 2016-04-02 MED ORDER — INSULIN ASPART 100 UNIT/ML ~~LOC~~ SOLN
10.0000 [IU] | Freq: Once | SUBCUTANEOUS | Status: AC
  Administered 2016-04-02: 10 [IU] via INTRAVENOUS

## 2016-04-02 MED ORDER — SODIUM CHLORIDE 0.9 % IV SOLN: 1.0000 g | Freq: Once | INTRAVENOUS | Status: DC

## 2016-04-02 MED ORDER — INSULIN ASPART 100 UNIT/ML ~~LOC~~ SOLN: 10.0000 [IU] | Freq: Once | SUBCUTANEOUS | Status: DC

## 2016-04-02 MED ORDER — PROPOFOL 1000 MG/100ML IV EMUL
5.0000 ug/kg/min | Freq: Once | INTRAVENOUS | Status: AC
  Administered 2016-04-02: 5 ug/kg/min via INTRAVENOUS
  Filled 2016-04-02: qty 100

## 2016-04-02 MED ORDER — VANCOMYCIN HCL 500 MG IV SOLR: 500.0000 mg | INTRAVENOUS | Status: DC

## 2016-04-02 MED ORDER — SODIUM CHLORIDE 0.9 % IV SOLN
100.0000 ug/h | INTRAVENOUS | Status: DC
  Administered 2016-04-02 – 2016-04-03 (×2): 100 ug/h via INTRAVENOUS
  Filled 2016-04-02 (×2): qty 50

## 2016-04-02 MED ORDER — INSULIN ASPART 100 UNIT/ML ~~LOC~~ SOLN
0.0000 [IU] | SUBCUTANEOUS | Status: DC
  Administered 2016-04-03 (×2): 3 [IU] via SUBCUTANEOUS
  Administered 2016-04-03: 2 [IU] via SUBCUTANEOUS

## 2016-04-02 MED ORDER — CISATRACURIUM BOLUS VIA INFUSION
0.0500 mg/kg | INTRAVENOUS | Status: DC | PRN
  Filled 2016-04-02: qty 3

## 2016-04-02 MED ORDER — DEXTROSE 50 % IV SOLN
1.0000 | Freq: Once | INTRAVENOUS | Status: AC
  Administered 2016-04-02: 50 mL via INTRAVENOUS
  Filled 2016-04-02: qty 50

## 2016-04-02 MED ORDER — SODIUM CHLORIDE 0.9 % IV SOLN
1.0000 g | Freq: Once | INTRAVENOUS | Status: AC
  Administered 2016-04-02: 1 g via INTRAVENOUS
  Filled 2016-04-02: qty 10

## 2016-04-02 MED ORDER — CISATRACURIUM BOLUS VIA INFUSION
0.1000 mg/kg | Freq: Once | INTRAVENOUS | Status: AC
  Administered 2016-04-02: 5 mg via INTRAVENOUS
  Filled 2016-04-02: qty 5

## 2016-04-02 MED ORDER — SODIUM POLYSTYRENE SULFONATE 15 GM/60ML PO SUSP
15.0000 g | Freq: Once | ORAL | Status: AC
  Administered 2016-04-02: 15 g via ORAL
  Filled 2016-04-02: qty 60

## 2016-04-02 MED ORDER — CHLORHEXIDINE GLUCONATE 0.12% ORAL RINSE (MEDLINE KIT)
15.0000 mL | Freq: Two times a day (BID) | OROMUCOSAL | Status: DC
  Administered 2016-04-02 – 2016-04-03 (×2): 15 mL via OROMUCOSAL

## 2016-04-02 MED ORDER — DEXTROSE 50 % IV SOLN
1.0000 | Freq: Once | INTRAVENOUS | Status: AC
Start: 2016-04-02 — End: 2016-04-02
  Administered 2016-04-02: 50 mL via INTRAVENOUS
  Filled 2016-04-02: qty 50

## 2016-04-02 MED ORDER — ALBUTEROL (5 MG/ML) CONTINUOUS INHALATION SOLN: 20.0000 mg | INHALATION_SOLUTION | Freq: Once | RESPIRATORY_TRACT | Status: DC

## 2016-04-02 MED ORDER — SODIUM CHLORIDE 0.9 % IV BOLUS (SEPSIS)
1000.0000 mL | Freq: Once | INTRAVENOUS | Status: AC
  Administered 2016-04-02: 1000 mL via INTRAVENOUS

## 2016-04-02 MED ORDER — PROPOFOL 1000 MG/100ML IV EMUL
25.0000 ug/kg/min | INTRAVENOUS | Status: DC
  Administered 2016-04-03: 25 ug/kg/min via INTRAVENOUS
  Filled 2016-04-02: qty 100

## 2016-04-02 MED ORDER — DEXTROSE 5 % IV SOLN: INTRAVENOUS | Status: DC

## 2016-04-02 MED ORDER — CEFEPIME HCL 1 G IJ SOLR
1.0000 g | Freq: Once | INTRAMUSCULAR | Status: AC
  Administered 2016-04-02: 1 g via INTRAVENOUS
  Filled 2016-04-02: qty 1

## 2016-04-02 MED ORDER — ALBUTEROL SULFATE (2.5 MG/3ML) 0.083% IN NEBU
2.5000 mg | INHALATION_SOLUTION | RESPIRATORY_TRACT | Status: DC | PRN
  Administered 2016-04-02: 2.5 mg via RESPIRATORY_TRACT
  Filled 2016-04-02: qty 3

## 2016-04-02 MED ORDER — NOREPINEPHRINE BITARTRATE 1 MG/ML IV SOLN
0.0000 ug/min | INTRAVENOUS | Status: DC
  Administered 2016-04-02: 5 ug/min via INTRAVENOUS
  Administered 2016-04-02: 12 ug/min via INTRAVENOUS
  Administered 2016-04-03: 18 ug/min via INTRAVENOUS
  Filled 2016-04-02 (×3): qty 4

## 2016-04-02 MED ORDER — INSULIN ASPART 100 UNIT/ML ~~LOC~~ SOLN
8.0000 [IU] | Freq: Once | SUBCUTANEOUS | Status: AC
  Administered 2016-04-02: 8 [IU] via SUBCUTANEOUS

## 2016-04-02 MED ORDER — VANCOMYCIN HCL IN DEXTROSE 750-5 MG/150ML-% IV SOLN
750.0000 mg | Freq: Once | INTRAVENOUS | Status: AC
  Administered 2016-04-02: 750 mg via INTRAVENOUS
  Filled 2016-04-02: qty 150

## 2016-04-02 MED ORDER — SODIUM CHLORIDE 0.9 % IV SOLN
1.0000 ug/kg/min | INTRAVENOUS | Status: DC
  Administered 2016-04-02: 1 ug/kg/min via INTRAVENOUS
  Filled 2016-04-02: qty 20

## 2016-04-02 NOTE — ED Notes (Signed)
Ice applied to bilateral groins and axilla.

## 2016-04-02 NOTE — Progress Notes (Signed)
Progress Note   Called to patient bedside via RN to access skin. Upon arrival to ICU patient was noted to have bruising on labia. No trauma noted. No finger/hand prints seen. Patient also has noted bruising also on arm, foot, neck, and legs.   Of note patient has been in a Rehab facility since Monday and daughter reported that patient has had foul smelling and dark colored urine.   Spoke with charge nurse/bedside nurse about this, patient is thrombocytopenic and has a history of easily bruising however, given that patient has been increasingly lethargic at rehab facility will have SANE nurse to see patient and will call rehab facility to seek out additional information.   Hayden Pedro, AG-ACNP [facility]  Pgr: [phone]  PCCM Pgr: [phone]

## 2016-04-02 NOTE — ED Triage Notes (Signed)
EMS called out for altered mental status- pt had been SOB recently and more lethargic. Pt initially was alert and oriented when EMS arrived per her baseline. Once EMS placed pt in truck- pt had witnessed arrest. Pt was in PEA, CPR for 5 mins. 1 round of epi. Pulses returned. HR 120's. Pt unconscious. No airway established by EMS, bag masking pt when arrived. EMS gave 549mL fluid

## 2016-04-02 NOTE — ED Notes (Signed)
CRITICAL VALUE ALERT  Critical value received: Lactic Acid 7.4  Date of notification: 04/05/2016  Time of notification:  [id number]  Critical value read back: yes   Nurse who received alert:  Earleen Newport RN   NP Louretta Parma with CCM

## 2016-04-02 NOTE — Sedation Documentation (Signed)
Pt on zoll pads 

## 2016-04-02 NOTE — Progress Notes (Signed)
eLink Physician-Brief Progress Note Patient Name: Victoria Lindsey DOB: 1925/11/13 MRN: [id number]   Date of Service  04/05/2016  HPI/Events of Note  Hyperglycemia  eICU Interventions  Placed on Q4 hour SSI sensitive scale     Intervention Category Intermediate Interventions: Hyperglycemia - evaluation and treatment  DETERDING,ELIZABETH 03/22/2016, 11:02 PM

## 2016-04-02 NOTE — Procedures (Signed)
Arterial Catheter Insertion Procedure Note Victoria Lindsey [id number] 03/25/1925  Procedure: Insertion of Arterial Catheter  Indications: Blood pressure monitoring and Frequent blood sampling  Procedure Details Consent: Unable to obtain consent because of emergent medical necessity. Time Out: Verified patient identification, verified procedure, site/side was marked, verified correct patient position, special equipment/implants available, medications/allergies/relevent history reviewed, required imaging and test results available.  Performed  Maximum sterile technique was used including antiseptics, cap, gloves, gown, hand hygiene, mask and sheet. Skin prep: Chlorhexidine; local anesthetic administered 20 gauge catheter was inserted into right radial artery using the Seldinger technique.  Evaluation Blood flow good; BP tracing good. Complications: No apparent complications. RT placed Arrow catheter on first attempt.  Dimple Nanas 04/01/2016

## 2016-04-02 NOTE — Progress Notes (Signed)
New arterial pressure line set-up due to line coming apart.

## 2016-04-02 NOTE — Progress Notes (Signed)
Pharmacy Antibiotic Note  Victoria Lindsey is a 83 y.o. female admitted on 03/15/2016 with respiratory arrest. Pharmacy has been consulted for vancomycin and cefepime for possible HCAP. She is in acute renal failure with sCr 2.1 and CrCl ~ 15 ml/min.  Vancomycin trough goal 15-20  Plan: 1) Vancomycin 750mg  IV x 1 then 500mg  IV q48 2) Cefepime 1g IV q24 3) Follow renal function, cultures, LOT, level if needed  Height: 5\' 2"  (157.5 cm) IBW/kg (Calculated) : 50.1  No data recorded.   Recent Labs Lab 03/27/16 0320 03/28/16 0351 03/29/16 03/29/16 0351 03/29/16 0954 03/27/2016 1451 03/14/2016 1452  WBC 7.2 6.8 6.6 6.6  --   --   --   CREATININE 1.50* 1.76* 1.6* 1.56* 1.56* 2.10*  --   LATICACIDVEN  --   --   --   --   --   --  6.67*    Estimated Creatinine Clearance: 14.6 mL/min (by C-G formula based on SCr of 2.1 mg/dL (H)).    Allergies  Allergen Reactions  . Aciphex [Rabeprazole Sodium]     unknown  . Amantadines     unknown  . Amlodipine Other (See Comments)    unknown No reaction listed  . Avapro [Irbesartan] Other (See Comments)    nightmares unknown unknown  . Cardura [Doxazosin Mesylate]     unknown  . Ciprofloxacin     unknown  . Clonidine Derivatives     unknown  . Cozaar     nightmares  . Diltiazem     unknown  . Doxycycline Hives  . Famotidine     unknown  . Hctz [Hydrochlorothiazide]     unknown  . Indapamide     unknown  . Lansoprazole     unknown  . Lisinopril     Mouth problems  . Loratadine     unknown  . Maxidex [Dexamethasone]     unknown  . Mirapex [Pramipexole Dihydrochloride]     unknown  . Norvasc [Amlodipine Besylate]     unknown  . Pantoprazole Sodium     unknown  . Prilosec [Omeprazole]     unknown  . Procardia [Nifedipine] Other (See Comments)    unknown Mouth sores unknown  . Proton Pump Inhibitors     unknown  . Sulfa Drugs Cross Reactors     unknown  . Trimethoprim     unknown  . Zyrtec [Cetirizine Hcl]    unknown  . Metronidazole Rash  . Penicillins Rash    Has patient had a PCN reaction causing immediate rash, facial/tongue/throat swelling, SOB or lightheadedness with hypotension: Yes Has patient had a PCN reaction causing severe rash involving mucus membranes or skin necrosis: No Has patient had a PCN reaction that required hospitalization: No Has patient had a PCN reaction occurring within the last 10 years: No If all of the above answers are "NO", then may proceed with Cephalosporin use.     Antimicrobials this admission: 1/26 Vancomycin >> 126 Cefepime >>  Dose adjustments this admission: n/a  Microbiology results: 1/26 blood >> 1/26 urine >> 1/26 sputum >>  Thank you for allowing pharmacy to be a part of this patient's care.  Deboraha Sprang 03/23/2016 3:07 PM

## 2016-04-02 NOTE — Sedation Documentation (Signed)
MD at bedside to intubate pt.  

## 2016-04-02 NOTE — ED Provider Notes (Addendum)
[facility] DEPT Provider Note   CSN: [id number] Arrival date & time:        History   Chief Complaint Chief Complaint  Patient presents with  . Cardiac Arrest    post cpr    HPI Victoria Lindsey is a 83 y.o. female.  HPI   83 year old female with a history of hypertension, hypothyroidism, chronic kidney disease, Parkinson's, prediabetes, recent admission for concern of acute pulmonary edema, possible UTI, hypertension who was discharged on January 22 back to the rehabilitation facility, presents with concern for cardiac arrest.  EMS was initially called out to the rehabilitation facility for concern of 2 days of altered mental status, worsening today. On EMS arrival, patient was awake, however sleepy and confused. In transport, she became progressively bradycardic and went into PEA.  CPR was initiated immediately, and patient was given 1 mg of epinephrine with return of spontaneous circulations after approximately 5 minutes of CPR. They attempted EJ placement on the right with development of a right-sided neck hematoma. They also attempted intubation, however patient was closing her mouth and they're unable to do so.  Family reports that she has been "out of it" for the last several days. Reports she had episodes of anxiety where her face would be flushed. They started her on Xanax at the facility, however failure. They thought this is making her sedated. They stopped the Xanax, and instead give her Lexapro today. Family noted that even without the Xanax, she seemed to be even more sleepy today. And had noted a moment where she did not respond to them of both of her arms were up in the air and stiff appearing, and she turned her eyes to the side. They deny known fevers, falls. She had cough yesterday but clear CXR.  They have been changing her blood pressure medications around, notably her metoprolol given afib vs orthostatic hypotension, and was restarted again at 75mg  BID.  Patient  arrives with a GCS of 3, being bagged by EMS with BVM.  Her vital signs are otherwise within normal limits.     Past Medical History:  Diagnosis Date  . Anemia   . Breast cancer ([place])    LEFT BREAST  . Dizziness   . Hyperglycemia   . Hyperlipidemia   . Hypertension   . Hypophonia   . Hypothyroidism   . Kidney failure    stage 3  . Left ventricular diastolic dysfunction   . Orthostatic hypotension   . Parkinson's disease   . Prediabetes   . TIA (transient ischemic attack)   . Tremor     Patient Active Problem List   Diagnosis Date Noted  . Cardiac arrest ([place]) 03/25/2016  . Urinary retention 03/31/2016  . Frequent UTI 03/31/2016  . Anxiety 03/31/2016  . AKI (acute kidney injury)/ on CKD 03/28/2016  . Hypertensive emergency 03/25/2016  . Orthostatic hypotension 12/29/2015  . Compression fracture of lumbar vertebra ([place]) 08/04/2015  . Avitaminosis D 08/04/2015  . Venous insufficiency 07/11/2015  . Pressure sore on ankle 07/11/2015  . Falls frequently 07/11/2015  . Pain in lower limb 04/17/2015  . Chronic constipation 03/24/2015  . Hypothyroidism due to acquired atrophy of thyroid 03/24/2015  . Herpes labialis 02/23/2015  . Dysphagia 02/13/2015  . Diarrhea 02/12/2015  . Onychomycosis 01/17/2015  . Folliculitis [id number]  . Encephalopathy, hypertensive 05/29/2014  . Dizziness of unknown cause 02/21/2014  . Left ventricular diastolic dysfunction with preserved systolic function ([place]) [id number]  . Hypertensive heart disease with CHF (congestive  heart failure) ([place]) 12/30/2013  . Transaminitis 12/24/2013  . Palpitations 12/23/2013  . Respiratory insufficiency 12/22/2013  . Normocytic anemia 12/21/2013  . Thrombocytopenia ([place]) 12/21/2013  . Abnormal ECG 12/15/2013  . Abnormal involuntary movement 12/15/2013  . Colorectal polyps 12/15/2013  . Chest pain 12/15/2013  . Arteriosclerosis of coronary artery 12/15/2013  . Chronic kidney disease (CKD), stage III  (moderate) 12/15/2013  . Corn 12/15/2013  . Diabetes ([place]) 12/15/2013  . Breathing difficult 12/15/2013  . Acid reflux 12/15/2013  . HLD (hyperlipidemia) 12/15/2013  . BP (high blood pressure) 12/15/2013  . FOM (frequency of micturition) 12/15/2013  . Breast cancer, female ([place]) 12/15/2013  . Fungal infection of nail 12/15/2013  . Osteopenia 12/15/2013  . Raynaud's syndrome 12/15/2013  . Restless leg 12/15/2013  . Gougerout-Sjoegren syndrome ([place]) 12/15/2013  . Change in blood platelet count 12/15/2013  . Infection of urinary tract 12/15/2013  . Paroxysmal digital cyanosis 12/15/2013  . Lower urinary tract infection 10/12/2013  . Idiopathic Parkinson's disease ([place]) 09/06/2013  . Bladder spasm 09/05/2013  . Urgency of micturation 09/05/2013  . Acute on chronic diastolic (congestive) heart failure 06/15/2012  . Hyperglycemia 12/27/2011  . Sjogren's syndrome ([place]) 12/17/2010  . TIA (transient ischemic attack) 12/17/2010  . Benign hypertensive heart disease without heart failure 09/01/2010  . Parkinson's disease ([place]) 09/01/2010  . Hypothyroidism 09/01/2010  . Hypercholesterolemia 09/01/2010  . Chronic anemia 09/01/2010    Past Surgical History:  Procedure Laterality Date  . BREAST LUMPECTOMY  2010  . CARDIOVASCULAR STRESS TEST  06/2007   NORMAL  . CATARACT EXTRACTION  2005,2006   Dr.Devonzo  . CHOLECYSTECTOMY  1994  . NASAL SINUS SURGERY     submucous resection late 1950s  . TONSILLECTOMY      OB History    No data available       Home Medications    Prior to Admission medications   Medication Sig Start Date End Date Taking? Authorizing Provider  aspirin 81 MG tablet Take 81 mg by mouth every evening.    Yes Historical Provider, MD  aspirin EC 325 MG tablet Take 650 mg by mouth every 6 (six) hours as needed (for pain).   Yes Historical Provider, MD  BIOTIN PO Take 1,000 mcg by mouth daily.    Yes Historical Provider, MD  Carbidopa-Levodopa ER (SINEMET CR) 25-100  MG tablet controlled release Take 1 tablet by mouth at bedtime. Patient taking differently: Take 1 tablet by mouth 4 (four) times daily.  03/16/16  Yes Rebecca S Tat, DO  Cholecalciferol (VITAMIN D3) 2000 UNITS TABS Take 2,000 Units by mouth daily.    Yes Historical Provider, MD  Cranberry 500 MG CAPS Take 500 mg by mouth daily.    Yes Historical Provider, MD  docusate sodium (COLACE) 100 MG capsule Take 100 mg by mouth 2 (two) times daily.    Yes Historical Provider, MD  escitalopram (LEXAPRO) 10 MG tablet Take 10 mg by mouth daily.   Yes Historical Provider, MD  folic acid (FOLVITE) [id number] MCG tablet Take 800 mcg by mouth daily.    Yes Historical Provider, MD  hydrALAZINE (APRESOLINE) 100 MG tablet Take 1 tablet (100 mg total) by mouth 3 (three) times daily. 03/29/16  Yes Courage Emokpae, MD  isosorbide mononitrate (IMDUR) 30 MG 24 hr tablet Take 1 tablet (30 mg total) by mouth daily. 03/30/16  Yes Courage Denton Brick, MD  levothyroxine (SYNTHROID) 175 MCG tablet Take one tablet by mouth once daily 30 minutes before breakfast for thyroid. 01/12/16  Yes Tiffany L Reed, DO  magic mouthwash w/lidocaine SOLN Take 5 mLs by mouth 4 (four) times daily as needed for mouth pain. 03/18/16  Yes Tiffany L Reed, DO  Metoprolol Tartrate 75 MG TABS Take 75 mg by mouth 2 (two) times daily.   Yes Historical Provider, MD  Multiple Vitamin (MULTIVITAMIN WITH MINERALS) TABS tablet Take 1 tablet by mouth daily.   Yes Historical Provider, MD  Nutritional Supplements (ENSURE ENLIVE PO) Take 237 mLs by mouth daily.   Yes Historical Provider, MD  Omega-3 Fatty Acids (FISH OIL) 1200 MG CAPS Take 1 capsule by mouth every evening.    Yes Historical Provider, MD  POLY-IRON 150 150 MG capsule TAKE ONE CAPSULE BY MOUTH DAILY 02/23/16  Yes Tiffany L Reed, DO  Polyethyl Glycol-Propyl Glycol (SYSTANE OP) Apply 1 drop to eye 2 (two) times daily as needed (dryness).   Yes Historical Provider, MD  rasagiline (AZILECT) 1 MG TABS tablet TAKE ONE  TABLET BY MOUTH EVERY DAY 03/03/16  Yes Rebecca S Tat, DO  saccharomyces boulardii (FLORASTOR) 250 MG capsule Take 250 mg by mouth 2 (two) times daily.   Yes Historical Provider, MD  ALPRAZolam (XANAX) 0.25 MG tablet Take 0.25 mg by mouth 2 (two) times daily.    Historical Provider, MD  carbidopa-levodopa (SINEMET IR) 25-100 MG tablet TAKE ONE TABLET BY MOUTH THREE TIMES DAILY Patient not taking: Reported on 03/10/2016 03/22/16   Eustace Quail Tat, DO  metoprolol 75 MG TABS Take 75 mg by mouth 2 (two) times daily. Patient not taking: Reported on 04/02/2016 03/29/16   Roxan Hockey, MD    Family History Family History  Problem Relation Age of Onset  . Heart disease Mother   . Heart failure Father   . Parkinsonism Father   . Diabetes Father   . COPD Sister   . Cancer Brother   . Osteoporosis Daughter   . Stroke Sister     Social History Social History  Substance Use Topics  . Smoking status: Former Smoker    Packs/day: 0.50    Years: 12.00    Types: Cigarettes    Quit date: 03/08/1964  . Smokeless tobacco: Never Used  . Alcohol use No     Allergies   Aciphex [rabeprazole sodium]; Amantadines; Amlodipine; Avapro [irbesartan]; Cardura [doxazosin mesylate]; Ciprofloxacin; Clonidine derivatives; Cozaar; Diltiazem; Doxycycline; Famotidine; Hctz [hydrochlorothiazide]; Indapamide; Lansoprazole; Lisinopril; Loratadine; Maxidex [dexamethasone]; Mirapex [pramipexole dihydrochloride]; Norvasc [amlodipine besylate]; Pantoprazole sodium; Prilosec [omeprazole]; Procardia [nifedipine]; Proton pump inhibitors; Sulfa drugs cross reactors; Trimethoprim; Zyrtec [cetirizine hcl]; Metronidazole; and Penicillins   Review of Systems Review of Systems  Unable to perform ROS: Mental status change     Physical Exam Updated Vital Signs BP (!) 183/73   Pulse 65   Temp (!) (P) 88 F (31.1 C) (Core (Comment))   Resp (!) 24   Ht 5\' 2"  (1.575 m)   LMP  (LMP Unknown)   SpO2 100%   Physical Exam    Constitutional: She appears cachectic. She has a sickly appearance.  BVM  HENT:  Head: Normocephalic and atraumatic.  Eyes: Conjunctivae are normal. Pupils are equal, round, and reactive to light.  Neck: Normal range of motion.  Right sided neck hematoma 8cm  Cardiovascular: Normal rate, regular rhythm, normal heart sounds and intact distal pulses.  Exam reveals no gallop and no friction rub.   No murmur heard. Pulmonary/Chest: She has no wheezes. She has rhonchi (diffuse). She has no rales.  Patient with agonal breaths, assisted with BVM  on EMS arrival  Abdominal: Soft. She exhibits no distension. There is no guarding.  Musculoskeletal: She exhibits no edema or tenderness.  Neurological: She is unresponsive. GCS eye subscore is 1. GCS verbal subscore is 1. GCS motor subscore is 1.  Skin: Skin is warm and dry. No rash noted. She is not diaphoretic. No erythema.  Nursing note and vitals reviewed.    ED Treatments / Results  Labs (all labs ordered are listed, but only abnormal results are displayed) Labs Reviewed  CBC WITH DIFFERENTIAL/PLATELET - Abnormal; Notable for the following:       Result Value   RBC 2.98 (*)    Hemoglobin 9.8 (*)    HCT 32.7 (*)    MCV 109.7 (*)    RDW 17.0 (*)    Platelets 144 (*)    All other components within normal limits  BRAIN NATRIURETIC PEPTIDE - Abnormal; Notable for the following:    B Natriuretic Peptide 2,176.3 (*)    All other components within normal limits  LACTIC ACID, PLASMA - Abnormal; Notable for the following:    Lactic Acid, Venous 7.4 (*)    All other components within normal limits  TROPONIN I - Abnormal; Notable for the following:    Troponin I 0.27 (*)    All other components within normal limits  BASIC METABOLIC PANEL - Abnormal; Notable for the following:    Sodium 132 (*)    Potassium 6.4 (*)    CO2 13 (*)    Glucose, Bld 127 (*)    BUN 62 (*)    Creatinine, Ser 1.84 (*)    Calcium 7.4 (*)    GFR calc non Af Amer 23  (*)    GFR calc Af Amer 27 (*)    All other components within normal limits  PROTIME-INR - Abnormal; Notable for the following:    Prothrombin Time 23.8 (*)    All other components within normal limits  APTT - Abnormal; Notable for the following:    aPTT 82 (*)    All other components within normal limits  I-STAT TROPOININ, ED - Abnormal; Notable for the following:    Troponin i, poc 0.17 (*)    All other components within normal limits  I-STAT CG4 LACTIC ACID, ED - Abnormal; Notable for the following:    Lactic Acid, Venous 6.67 (*)    All other components within normal limits  I-STAT CHEM 8, ED - Abnormal; Notable for the following:    Sodium 132 (*)    Potassium 6.9 (*)    BUN 90 (*)    Creatinine, Ser 2.10 (*)    Glucose, Bld 55 (*)    Calcium, Ion 1.06 (*)    Hemoglobin 10.9 (*)    HCT 32.0 (*)    All other components within normal limits  I-STAT ARTERIAL BLOOD GAS, ED - Abnormal; Notable for the following:    pH, Arterial 7.187 (*)    pO2, Arterial 453.0 (*)    Bicarbonate 18.0 (*)    Acid-base deficit 11.0 (*)    All other components within normal limits  CBG MONITORING, ED - Abnormal; Notable for the following:    Glucose-Capillary 58 (*)    All other components within normal limits  POCT I-STAT 3, ART BLOOD GAS (G3+) - Abnormal; Notable for the following:    pH, Arterial 7.216 (*)    pO2, Arterial 534.0 (*)    Bicarbonate 16.3 (*)    Acid-base deficit 11.0 (*)  All other components within normal limits  CULTURE, BLOOD (ROUTINE X 2)  CULTURE, BLOOD (ROUTINE X 2)  URINE CULTURE  CULTURE, RESPIRATORY (NON-EXPECTORATED)  RESPIRATORY PANEL BY PCR  SURGICAL PCR SCREEN  TSH  PROCALCITONIN  BLOOD GAS, ARTERIAL  URINALYSIS, ROUTINE W REFLEX MICROSCOPIC  LACTIC ACID, PLASMA  TROPONIN I  TROPONIN I  BASIC METABOLIC PANEL  BASIC METABOLIC PANEL  BASIC METABOLIC PANEL  BASIC METABOLIC PANEL  BASIC METABOLIC PANEL  PROTIME-INR  APTT  BLOOD GAS, ARTERIAL    BLOOD GAS, ARTERIAL  BASIC METABOLIC PANEL  PROCALCITONIN  CBC  MAGNESIUM  PHOSPHORUS  BASIC METABOLIC PANEL    EKG  EKG Interpretation  Date/Time:  Friday April 02 2016 13:35:59 EST Ventricular Rate:  76 PR Interval:    QRS Duration: 108 QT Interval:  391 QTC Calculation: 440 R Axis:   70 Text Interpretation:  Sinus rhythm Ventricular premature complex Probable left ventricular hypertrophy ST elevation, consider anterior injury Since prior ECG, TW changes in I and aVL improved, patient with TW greater amplitude V4-V5, similar TW apperance in V2/V3 Confirmed by [facility] MD, Junie Panning ([id number]) on 03/20/2016 6:08:06 PM       Radiology Ct Head Wo Contrast  Result Date: 03/27/2016 CLINICAL DATA:  Altered mental status, lethargy, witnessed cardiac arrest EXAM: CT HEAD WITHOUT CONTRAST TECHNIQUE: Contiguous axial images were obtained from the base of the skull through the vertex without intravenous contrast. COMPARISON:  06/20/2015 FINDINGS: Brain: No evidence of acute infarction, hemorrhage, hydrocephalus, extra-axial collection or mass lesion/mass effect. Mild global cortical atrophy. Subcortical white matter and periventricular small vessel ischemic changes. Vascular: Intracranial atherosclerosis. Skull: Normal. Negative for fracture or focal lesion. Sinuses/Orbits: The visualized paranasal sinuses are essentially clear. The mastoid air cells are unopacified. Other: None. IMPRESSION: No evidence of acute intracranial abnormality. Atrophy with small vessel ischemic changes. Electronically Signed   By: Julian Hy M.D.   On: 03/26/2016 17:01   Ct Soft Tissue Neck Wo Contrast  Result Date: 03/24/2016 CLINICAL DATA:  83 y/o F; bruising on the right-sided neck from central line placement attempt. EXAM: CT NECK WITHOUT CONTRAST TECHNIQUE: Multidetector CT imaging of the neck was performed following the standard protocol without intravenous contrast. COMPARISON:  06/20/2015 cervical spine  CT. FINDINGS: Pharynx and larynx: Endotracheal tube 2.3 cm from the carina. Enteric tube tip below the field of view. Debris in the nasal and oropharynx is likely due to intubation. Stable focal thickening of the right thyroid cartilage. Salivary glands: No inflammation, mass, or stone. Thyroid: Normal. Lymph nodes: None enlarged or abnormal density. Vascular: Aortic atherosclerosis with calcification. Left-sided central venous catheter with tip in the upper SVC. Limited intracranial: Negative. Visualized orbits: Bilateral intra-ocular lens replacement. Mastoids and visualized paranasal sinuses: Right mastoid effusion, probably due to intubation. Opacification of left sphenoid sinus. Skeleton: Cervical spondylosis greatest at the C5 through C7 levels. No high-grade bony canal stenosis or foraminal narrowing. Upper chest: Septal thickening and ground-glass opacities probably represent pulmonary edema. Superimposed are numerous pulmonary nodules and patchy consolidations with peribronchial thickening which may represent a component of bronchopneumonia. Right greater than left small pleural effusions. Other: Fat stranding throughout the subcutaneous and deep cervical fat of the right anterior and lateral neck from the level of hyoid bone to the manubrium with thickening of the platysma muscle. No discrete mass is identified. IMPRESSION: 1. Fat stranding throughout the subcutaneous and deep cervical fat of the right anterior and lateral neck from the level of hyoid bone to the manubrium with thickening  of the platysma muscle. This probably represents edema related to the attempted central line placement. Anasarca and cellulitis can have a similar appearance. No discrete hematoma is identified. 2. Interstitial pulmonary edema and small bilateral pleural effusions. 3. Findings suspicious for bronchopneumonia in lung apices. Electronically Signed   By: Kristine Garbe M.D.   On: 04/01/2016 17:12   Dg Chest Port  1 View  Result Date: 04/04/2016 CLINICAL DATA:  Status post central line placement EXAM: PORTABLE CHEST 1 VIEW COMPARISON:  03/11/2016 FINDINGS: Endotracheal tube and nasogastric catheter are again seen in satisfactory position. Left jugular central line is noted extending to the junction of the innominate veins. No pneumothorax is seen. Diffuse bilateral infiltrative changes are noted consistent with edema. No bony abnormality is seen. IMPRESSION: No pneumothorax following central line placement as described. The remainder of the exam is stable. Electronically Signed   By: Inez Catalina M.D.   On: 03/29/2016 16:41   Dg Chest Portable 1 View  Result Date: 03/23/2016 CLINICAL DATA:  New endotracheal tube, orogastric tube in attempted right RJ. Respiratory arrest. EXAM: PORTABLE CHEST 1 VIEW COMPARISON:  03/26/2016 FINDINGS: Enteric tube courses into the stomach and off the inferior portion of the film, although appears to be coiled over the stomach with tip and side-port within the stomach just below the diaphragm. Endotracheal tube is present along the right lateral wall of the distal trachea 1.7 cm above the carina. Lungs are adequately inflated with hazy bilateral perihilar interstitial prominence suggesting interstitial edema and less likely infection. Possible small amount right pleural fluid. Cardiomediastinal silhouette is unremarkable. There is mild calcified plaque over the thoracic aorta. Mild degenerate change of the spine. IMPRESSION: Bilateral all perihilar interstitial prominence likely interstitial edema and less likely infection. Possible small amount right pleural fluid. Tubes and lines as described. Note that the endotracheal tube has tip along the right lateral wall of the distal trachea 1.7 cm above the carina. Electronically Signed   By: Marin Olp M.D.   On: 03/24/2016 14:25    Procedures Procedure Name: Intubation Date/Time: 03/16/2016 6:21 PM Performed by: Gareth Morgan Oxygen  Delivery Method: Ambu bag Preoxygenation: Pre-oxygenation with 100% oxygen Intubation Type: IV induction and Rapid sequence Ventilation: Mask ventilation without difficulty Laryngoscope Size: 3 Grade View: Grade I Tube size: 7.5 mm Number of attempts: 1 Placement Confirmation: ETT inserted through vocal cords under direct vision Secured at: 24 cm Comments: Withdrew ETT 2cm after CXR       (including critical care time)  Medications Ordered in ED Medications  etomidate (AMIDATE) injection (20 mg Intravenous Given 03/29/2016 1340)  norepinephrine (LEVOPHED) 4 mg in dextrose 5 % 250 mL (0.016 mg/mL) infusion (5 mcg/min Intravenous New Bag/Given 03/23/2016 1532)  cisatracurium (NIMBEX) bolus via infusion 5 mg (not administered)    And  cisatracurium (NIMBEX) 200 mg in sodium chloride 0.9 % 200 mL (1 mg/mL) infusion (not administered)    And  cisatracurium (NIMBEX) bolus via infusion 2.5 mg (not administered)  artificial tears (LACRILUBE) ophthalmic ointment 1 application (not administered)  0.9 %  sodium chloride infusion ( Intravenous New Bag/Given 04/02/16 1534)  fentaNYL (SUBLIMAZE) injection 50 mcg (not administered)  fentaNYL (SUBLIMAZE) 2,500 mcg in sodium chloride 0.9 % 250 mL (10 mcg/mL) infusion (not administered)  fentaNYL (SUBLIMAZE) bolus via infusion 25 mcg (not administered)  propofol (DIPRIVAN) 1000 MG/100ML infusion (not administered)  ceFEPIme (MAXIPIME) 1 g in dextrose 5 % 50 mL IVPB (not administered)  vancomycin (VANCOCIN) 500 mg in sodium chloride  0.9 % 100 mL IVPB (not administered)  dextrose 5 % solution (not administered)  sodium polystyrene (KAYEXALATE) 15 GM/60ML suspension 15 g (not administered)  calcium gluconate 1 g in sodium chloride 0.9 % 100 mL IVPB (1 g Intravenous Given 03/19/2016 1810)  sucralfate (CARAFATE) 1 GM/10ML suspension 1 g (not administered)  albuterol (PROVENTIL) (2.5 MG/3ML) 0.083% nebulizer solution 2.5 mg (not administered)  sodium chloride  0.9 % bolus 1,000 mL (0 mLs Intravenous Stopped 03/22/2016 1459)  propofol (DIPRIVAN) 1000 MG/100ML infusion (5 mcg/kg/min  49.9 kg Intravenous New Bag/Given 03/29/2016 1402)  ceFEPIme (MAXIPIME) 1 g in dextrose 5 % 50 mL IVPB (1 g Intravenous New Bag/Given 03/31/2016 1459)  vancomycin (VANCOCIN) IVPB 750 mg/150 ml premix (750 mg Intravenous Given 03/24/2016 1532)  dextrose 50 % solution 50 mL (50 mLs Intravenous Given 03/09/2016 1614)  insulin aspart (novoLOG) injection 10 Units (10 Units Intravenous Given 04/07/2016 1809)  dextrose 50 % solution 50 mL (50 mLs Intravenous Given 03/10/2016 1806)     Initial Impression / Assessment and Plan / ED Course  I have reviewed the triage vital signs and the nursing notes.  Pertinent labs & imaging results that were available during my care of the patient were reviewed by me and considered in my medical decision making (see chart for details).    83 year old female with a history of hypertension, hypothyroidism, chronic kidney disease, Parkinson's, prediabetes, recent admission for concern of acute pulmonary edema, possible UTI, hypertension who was discharged on January 22 back to the rehabilitation facility, presents with concern for cardiac arrest.  EMS was initially called out to the rehabilitation facility for concern of 2 days of altered mental status, worsening today. On EMS arrival, patient was awake, however sleepy and confused. In transport, she became progressively bradycardic and went into PEA.  CPR was initiated immediately, and patient was given 1 mg of epinephrine with return of spontaneous circulations after approximately 5 minutes of CPR.   She was intubated for airway protection and respiratory failure. There was report of cough yesterday, and patient was given empiric vancomycin and cefepime for possible sepsis as etiology of altered mental status and rest. Obtained head CT to evaluate evaluate for other intracranial etiology of arrest. Will also check CT  neck to evaluate hematoma. I-STAT chem 8 showed a potassium of 6.9, glucose 55, and ordered insulin and dextrose as well as albuterol, however lab called reporting this was hemolyzed, and ICU team would like to await final potassium results prior to treatment.  CXR with signs of fluid overload, ETT pulled back 2cm.   Unclear etiology of arrest at this time.  Possible PEA from sepsis/pneumonia, medications, CVA, drug/beta blocker effect, CHF. Patient hemodynamically stable at time of my evaluation, however after ICU took over has become hypotensive. She was admitted to ICU for further care.  Consulted ICU regarding hypothermia protocol however they are deciding and will initiate if appropriate.     Final Clinical Impressions(s) / ED Diagnoses   Final diagnoses:  Cardiac arrest ([place])  Metabolic acidosis  Glasgow coma scale total score 3-8, in the field (EMT or ambulance) [facility])  PEA (Pulseless electrical activity) [facility])    New Prescriptions Current Discharge Medication List       Gareth Morgan, MD 04/06/2016 Pearl, MD 03/11/2016 [id number]

## 2016-04-02 NOTE — Progress Notes (Signed)
eLink Physician-Brief Progress Note Patient Name: Victoria Lindsey DOB: Mar 01, 1926 MRN: [id number]   Date of Service  03/18/2016  HPI/Events of Note  SUP  eICU Interventions  Sucralfate Allergic to pepcid/ PPI     Intervention Category Intermediate Interventions: Best-practice therapies (e.g. DVT, beta blocker, etc.)  ALVA,RAKESH V. 03/26/2016, 5:52 PM

## 2016-04-02 NOTE — H&P (Signed)
PULMONARY / CRITICAL CARE MEDICINE   Name: Victoria Lindsey MRN: [id number] DOB: 09/30/1925    ADMISSION DATE:  03/27/2016 CONSULTATION DATE:  04/02/2016  REFERRING MD:  Dr.   CHIEF COMPLAINT:  Cardiac Arrest   HISTORY OF PRESENT ILLNESS:   83 year old female with PMH of Parkinson's disease, hypothyroidism, anemia, and chronic kidney disease. Presents to ED on 1/26 from Rehab facility, family reports that patient was lethargic and seemed to have facial droop and weakness, during transport to ED patient cardiac arrested for 5 minutes with 1 round of epi. Upon arrival to ED patient was intubated and PCCM was consulted.   Of note patient had recent admission from 1/18-1/22 with HTN crisis. Patient was discharged to rehab facility. Family reports that patient has been orthostatic and required adjustment in BP medications. Since arrival to facility Monday patient has been depressed and anxious, was started on Xanax BID and Lexapro.   PAST MEDICAL HISTORY :  She  has a past medical history of Anemia; Breast cancer ([place]); Dizziness; Hyperglycemia; Hyperlipidemia; Hypertension; Hypophonia; Hypothyroidism; Kidney failure; Left ventricular diastolic dysfunction; Orthostatic hypotension; Parkinson's disease; Prediabetes; TIA (transient ischemic attack); and Tremor.  PAST SURGICAL HISTORY: She  has a past surgical history that includes Cardiovascular stress test (06/2007); Breast lumpectomy (2010); Cholecystectomy (1994); Tonsillectomy; Nasal sinus surgery; and Cataract extraction [id number]).  Allergies  Allergen Reactions  . Aciphex [Rabeprazole Sodium]     unknown  . Amantadines     unknown  . Amlodipine Other (See Comments)    unknown  . Avapro [Irbesartan] Other (See Comments)    Nightmares   . Cardura [Doxazosin Mesylate]     unknown  . Ciprofloxacin     unknown  . Clonidine Derivatives     unknown  . Cozaar     Nightmares   . Diltiazem     unknown  . Doxycycline Hives  .  Famotidine     unknown  . Hctz [Hydrochlorothiazide]     unknown  . Indapamide     unknown  . Lansoprazole     unknown  . Lisinopril     Mouth problems  . Loratadine     unknown  . Maxidex [Dexamethasone]     unknown  . Mirapex [Pramipexole Dihydrochloride]     unknown  . Norvasc [Amlodipine Besylate]     unknown  . Pantoprazole Sodium     unknown  . Prilosec [Omeprazole]     unknown  . Procardia [Nifedipine] Other (See Comments)    Mouth sores   . Proton Pump Inhibitors     unknown  . Sulfa Drugs Cross Reactors     unknown  . Trimethoprim     unknown  . Zyrtec [Cetirizine Hcl]     unknown  . Metronidazole Rash  . Penicillins Rash    Has patient had a PCN reaction causing immediate rash, facial/tongue/throat swelling, SOB or lightheadedness with hypotension: Yes Has patient had a PCN reaction causing severe rash involving mucus membranes or skin necrosis: No Has patient had a PCN reaction that required hospitalization: No Has patient had a PCN reaction occurring within the last 10 years: No If all of the above answers are "NO", then may proceed with Cephalosporin use.     No current facility-administered medications on file prior to encounter.    Current Outpatient Prescriptions on File Prior to Encounter  Medication Sig  . aspirin 81 MG tablet Take 81 mg by mouth every evening.   Marland Kitchen BIOTIN PO  Take 1,000 mcg by mouth daily.   . Carbidopa-Levodopa ER (SINEMET CR) 25-100 MG tablet controlled release Take 1 tablet by mouth at bedtime. (Patient taking differently: Take 1 tablet by mouth 4 (four) times daily. )  . Cholecalciferol (VITAMIN D3) 2000 UNITS TABS Take 2,000 Units by mouth daily.   . Cranberry 500 MG CAPS Take 500 mg by mouth daily.   Marland Kitchen docusate sodium (COLACE) 100 MG capsule Take 100 mg by mouth 2 (two) times daily.   . folic acid (FOLVITE) [id number] MCG tablet Take 800 mcg by mouth daily.   . hydrALAZINE (APRESOLINE) 100 MG tablet Take 1 tablet (100 mg total) by  mouth 3 (three) times daily.  . isosorbide mononitrate (IMDUR) 30 MG 24 hr tablet Take 1 tablet (30 mg total) by mouth daily.  Marland Kitchen levothyroxine (SYNTHROID) 175 MCG tablet Take one tablet by mouth once daily 30 minutes before breakfast for thyroid.  . magic mouthwash w/lidocaine SOLN Take 5 mLs by mouth 4 (four) times daily as needed for mouth pain.  . Multiple Vitamin (MULTIVITAMIN WITH MINERALS) TABS tablet Take 1 tablet by mouth daily.  . Omega-3 Fatty Acids (FISH OIL) 1200 MG CAPS Take 1 capsule by mouth every evening.   Marland Kitchen POLY-IRON 150 150 MG capsule TAKE ONE CAPSULE BY MOUTH DAILY  . Polyethyl Glycol-Propyl Glycol (SYSTANE OP) Apply 1 drop to eye 2 (two) times daily as needed (dryness).  . rasagiline (AZILECT) 1 MG TABS tablet TAKE ONE TABLET BY MOUTH EVERY DAY  . saccharomyces boulardii (FLORASTOR) 250 MG capsule Take 250 mg by mouth 2 (two) times daily.  Marland Kitchen ALPRAZolam (XANAX) 0.25 MG tablet Take 0.25 mg by mouth 2 (two) times daily.  . carbidopa-levodopa (SINEMET IR) 25-100 MG tablet TAKE ONE TABLET BY MOUTH THREE TIMES DAILY (Patient not taking: Reported on 03/31/2016)  . metoprolol 75 MG TABS Take 75 mg by mouth 2 (two) times daily. (Patient not taking: Reported on 03/21/2016)    FAMILY HISTORY:  Her indicated that her mother is deceased. She indicated that her father is deceased. She indicated that one of her two sisters is deceased. She indicated that all of her three brothers are deceased. She indicated that her daughter is alive.    SOCIAL HISTORY: She  reports that she quit smoking about 52 years ago. Her smoking use included Cigarettes. She has a 6.00 pack-year smoking history. She has never used smokeless tobacco. She reports that she does not drink alcohol or use drugs.  REVIEW OF SYSTEMS:   Patient unresponsive, unable to answer questions   SUBJECTIVE:  Sedated on propofol gtt.   VITAL SIGNS: BP (!) 121/47   Pulse 65   Temp (!) 93.6 F (34.2 C) (Rectal)   Resp 24   Ht  5\' 2"  (1.575 m)   LMP  (LMP Unknown)   SpO2 100%   HEMODYNAMICS:    VENTILATOR SETTINGS: Vent Mode: PRVC FiO2 (%):  [50 %-100 %] 50 % Set Rate:  [18 bmp-24 bmp] 24 bmp Vt Set:  [400 mL] 400 mL PEEP:  [5 cmH20] 5 cmH20 Plateau Pressure:  [27 cmH20-29 cmH20] 29 cmH20  INTAKE / OUTPUT: No intake/output data recorded.  PHYSICAL EXAMINATION: General: Elderly female, no distress   Neuro: Sedated, pupils intact, no response upon sternal rub, +gag, +corneal   HEENT: ETT in place    Cardiovascular: RRR, no MRG, NI S1/S2 Lungs: Diminished to bases, non-labored Abdomen: non-distended, active bowel sounds  Musculoskeletal: no deformities  Skin: bruising noted to right  neck, intact, cool   LABS:  BMET  Recent Labs Lab 03/28/16 0351  03/29/16 0351 03/29/16 0954 03/24/2016 1451  NA 134*  < > 132* 135 132*  K 4.5  --  4.2 4.1 6.9*  CL 95*  --  98* 97* 101  CO2 29  --  25 25  --   BUN 47*  < > 44* 48* 90*  CREATININE 1.76*  < > 1.56* 1.56* 2.10*  GLUCOSE 102*  --  98 176* 55*  < > = values in this interval not displayed.  Electrolytes  Recent Labs Lab 03/28/16 0351 03/29/16 0351 03/29/16 0954  CALCIUM 9.1 8.6* 8.9    CBC  Recent Labs Lab 03/28/16 0351 03/29/16 03/29/16 0351 03/31/2016 1441 03/11/2016 1451  WBC 6.8 6.6 6.6 9.9  --   HGB 10.2*  --  10.1* 9.8* 10.9*  HCT 32.4*  --  32.1* 32.7* 32.0*  PLT 138*  --  145* 144*  --     Coag's No results for input(s): APTT, INR in the last 168 hours.  Sepsis Markers  Recent Labs Lab 03/21/2016 1452 03/09/2016 1501  LATICACIDVEN 6.67* 7.4*    ABG  Recent Labs Lab 03/13/2016 1426  PHART 7.187*  PCO2ART 44.3  PO2ART 453.0*    Liver Enzymes No results for input(s): AST, ALT, ALKPHOS, BILITOT, ALBUMIN in the last 168 hours.  Cardiac Enzymes No results for input(s): TROPONINI, PROBNP in the last 168 hours.  Glucose  Recent Labs Lab 03/28/16 0757 03/28/16 1116 03/28/16 1710 03/28/16 2059 03/29/16 0726  03/29/16 1124  GLUCAP 116* 125* 97 145* 115* 119*    Imaging Dg Chest Portable 1 View  Result Date: 03/10/2016 CLINICAL DATA:  New endotracheal tube, orogastric tube in attempted right RJ. Respiratory arrest. EXAM: PORTABLE CHEST 1 VIEW COMPARISON:  03/26/2016 FINDINGS: Enteric tube courses into the stomach and off the inferior portion of the film, although appears to be coiled over the stomach with tip and side-port within the stomach just below the diaphragm. Endotracheal tube is present along the right lateral wall of the distal trachea 1.7 cm above the carina. Lungs are adequately inflated with hazy bilateral perihilar interstitial prominence suggesting interstitial edema and less likely infection. Possible small amount right pleural fluid. Cardiomediastinal silhouette is unremarkable. There is mild calcified plaque over the thoracic aorta. Mild degenerate change of the spine. IMPRESSION: Bilateral all perihilar interstitial prominence likely interstitial edema and less likely infection. Possible small amount right pleural fluid. Tubes and lines as described. Note that the endotracheal tube has tip along the right lateral wall of the distal trachea 1.7 cm above the carina. Electronically Signed   By: Marin Olp M.D.   On: 03/21/2016 14:25     STUDIES:  CT Head 1/26 >> CTA Neck 1/26 >>   CULTURES: Blood 1/26 >> Sputum 1/26>> Urine 1/26>>  ANTIBIOTICS: Cefepime 1/26 >> Vancomycin 1/26 >>  SIGNIFICANT EVENTS: 1/26 > Presents to ED post cardiac arrest   LINES/TUBES: ETT 1/26 >> LIJ 1/26 >>  DISCUSSION: 83 year old female currently resides in rehab facility. This morning seemed lethargic and had facial droop per family. On the way to ED cardiac arrested. 5 minute down time.   ASSESSMENT / PLAN:  PULMONARY A: Acute Respiratory Failure Secondary to Cardiac Arrest  P:   Vent Support  Wean as tolerated  F/U ABG F/U CXR   CARDIOVASCULAR A:  Post Cardiac Arrest with  unknown etiology  Hypotension  P:  Code Cool goal 33  ECHO Wean Levophed to Maintain MAP >80 Cardiac Monitoring   RENAL A:   Acute on Chronic Renal Failure  Hyperkalemia  P:   Trend BMP Repeat K Replace Electrolytes as needed  Avoid Nephrotoxic Drugs   GASTROINTESTINAL A:   No issues P:   NPO   HEMATOLOGIC A:   No issues  P:  Trend CBC INR - per code cool  Hold Heparin until CT Head comes back   INFECTIOUS A:   Cardiac Arrest with unknown etiology  Lactic Acidosis  P:   Trend Lactic Acid and Procal  PAN culture  Follow Culture Data  Cefepime and Vancomycin   ENDOCRINE A:   Hypoglycemia   P:   Q1H glucose checks per cooling protocol  D5W @ 50 ml/hr   NEUROLOGIC A:   Acute Encephalopathy  P:   Monitor Code Cool EEG Head CT RASS goal: -4/-5    FAMILY  - Updates: Family updated in ED. Daughter states that patient wants everything done in the short term to determine if process would be reversible.   - Inter-disciplinary family meet or Palliative Care meeting due by: 04/09/16    Hayden Pedro, AG-ACNP [facility]  Pgr: [phone]  PCCM Pgr: [phone]

## 2016-04-02 NOTE — Progress Notes (Signed)
eLink Physician-Brief Progress Note Patient Name: RAEONNA GOODLOW DOB: January 12, 1926 MRN: [id number]   Date of Service  03/19/2016  HPI/Events of Note  K 6.1 CBg high  eICU Interventions  Insulin 8 u IV x 1 Received kayexalate     Intervention Category Intermediate Interventions: Electrolyte abnormality - evaluation and management  Malena Timpone V. 03/27/2016, 8:08 PM

## 2016-04-02 NOTE — ED Notes (Signed)
Successful intubation 

## 2016-04-02 NOTE — Procedures (Signed)
Central Venous Catheter Insertion Procedure Note ROSINA DEMARINIS [id number] 12-18-1925  Procedure: Insertion of Central Venous Catheter Indications: Assessment of intravascular volume, Drug and/or fluid administration and Frequent blood sampling  Procedure Details Consent: Risks of procedure as well as the alternatives and risks of each were explained to the (patient/caregiver).  Consent for procedure obtained. Time Out: Verified patient identification, verified procedure, site/side was marked, verified correct patient position, special equipment/implants available, medications/allergies/relevent history reviewed, required imaging and test results available.  Performed  Maximum sterile technique was used including antiseptics, cap, gloves, gown, hand hygiene, mask and sheet. Skin prep: Chlorhexidine; local anesthetic administered A antimicrobial bonded/coated triple lumen catheter was placed in the left internal jugular vein using the Seldinger technique.  Evaluation Blood flow good Complications: No apparent complications Patient did tolerate procedure well. Chest X-ray ordered to verify placement.  CXR: pending.  Hayden Pedro, AG-ACNP [facility]  Pgr: [phone]  PCCM Pgr: [phone]

## 2016-04-03 ENCOUNTER — Inpatient Hospital Stay (HOSPITAL_COMMUNITY): Payer: Medicare Other

## 2016-04-03 DIAGNOSIS — J9601 Acute respiratory failure with hypoxia: Secondary | ICD-10-CM

## 2016-04-03 DIAGNOSIS — G931 Anoxic brain damage, not elsewhere classified: Secondary | ICD-10-CM

## 2016-04-03 DIAGNOSIS — J211 Acute bronchiolitis due to human metapneumovirus: Secondary | ICD-10-CM

## 2016-04-03 DIAGNOSIS — G9340 Encephalopathy, unspecified: Secondary | ICD-10-CM

## 2016-04-03 LAB — BASIC METABOLIC PANEL
Anion gap: 17 — ABNORMAL HIGH (ref 5–15)
Anion gap: 18 — ABNORMAL HIGH (ref 5–15)
Anion gap: 19 — ABNORMAL HIGH (ref 5–15)
BUN: 66 mg/dL — AB (ref 6–20)
BUN: 68 mg/dL — AB (ref 6–20)
BUN: 69 mg/dL — AB (ref 6–20)
CHLORIDE: 102 mmol/L (ref 101–111)
CHLORIDE: 106 mmol/L (ref 101–111)
CO2: 11 mmol/L — ABNORMAL LOW (ref 22–32)
CO2: 8 mmol/L — ABNORMAL LOW (ref 22–32)
CO2: <7 mmol/L — ABNORMAL LOW (ref 22–32)
CREATININE: 2.17 mg/dL — AB (ref 0.44–1.00)
Calcium: 7.5 mg/dL — ABNORMAL LOW (ref 8.9–10.3)
Calcium: 7.6 mg/dL — ABNORMAL LOW (ref 8.9–10.3)
Calcium: 7.6 mg/dL — ABNORMAL LOW (ref 8.9–10.3)
Chloride: 103 mmol/L (ref 101–111)
Creatinine, Ser: 2 mg/dL — ABNORMAL HIGH (ref 0.44–1.00)
Creatinine, Ser: 2.18 mg/dL — ABNORMAL HIGH (ref 0.44–1.00)
GFR calc Af Amer: 22 mL/min — ABNORMAL LOW (ref >60)
GFR calc non Af Amer: 19 mL/min — ABNORMAL LOW (ref >60)
GFR, EST AFRICAN AMERICAN: 22 mL/min — AB (ref >60)
GFR, EST AFRICAN AMERICAN: 24 mL/min — AB (ref >60)
GFR, EST NON AFRICAN AMERICAN: 19 mL/min — AB (ref >60)
GFR, EST NON AFRICAN AMERICAN: 21 mL/min — AB (ref >60)
GLUCOSE: 194 mg/dL — AB (ref 65–99)
Glucose, Bld: 254 mg/dL — ABNORMAL HIGH (ref 65–99)
Glucose, Bld: 299 mg/dL — ABNORMAL HIGH (ref 65–99)
POTASSIUM: 5 mmol/L (ref 3.5–5.1)
POTASSIUM: 5.1 mmol/L (ref 3.5–5.1)
Potassium: 6.6 mmol/L (ref 3.5–5.1)
SODIUM: 129 mmol/L — AB (ref 135–145)
SODIUM: 130 mmol/L — AB (ref 135–145)
SODIUM: 130 mmol/L — AB (ref 135–145)

## 2016-04-03 LAB — GLUCOSE, CAPILLARY
GLUCOSE-CAPILLARY: 165 mg/dL — AB (ref 65–99)
GLUCOSE-CAPILLARY: 131 mg/dL — AB (ref 65–99)
GLUCOSE-CAPILLARY: 186 mg/dL — AB (ref 65–99)
GLUCOSE-CAPILLARY: 184 mg/dL — AB (ref 65–99)
Glucose-Capillary: 197 mg/dL — ABNORMAL HIGH (ref 65–99)
Glucose-Capillary: 205 mg/dL — ABNORMAL HIGH (ref 65–99)
Glucose-Capillary: 231 mg/dL — ABNORMAL HIGH (ref 65–99)

## 2016-04-03 LAB — CBC
HCT: 31.4 % — ABNORMAL LOW (ref 36.0–46.0)
HEMOGLOBIN: 9.9 g/dL — AB (ref 12.0–15.0)
MCH: 33.3 pg (ref 26.0–34.0)
MCHC: 31.5 g/dL (ref 30.0–36.0)
MCV: 105.7 fL — ABNORMAL HIGH (ref 78.0–100.0)
PLATELETS: 66 10*3/uL — AB (ref 150–400)
RBC: 2.97 MIL/uL — ABNORMAL LOW (ref 3.87–5.11)
RDW: 17.2 % — AB (ref 11.5–15.5)
WBC: 6.5 10*3/uL (ref 4.0–10.5)

## 2016-04-03 LAB — PHOSPHORUS: PHOSPHORUS: 6.2 mg/dL — AB (ref 2.5–4.6)

## 2016-04-03 LAB — URINE CULTURE: CULTURE: NO GROWTH

## 2016-04-03 LAB — PROCALCITONIN: PROCALCITONIN: 3.03 ng/mL

## 2016-04-03 LAB — TROPONIN I
TROPONIN I: 0.86 ng/mL — AB (ref <0.03)
Troponin I: 2.35 ng/mL (ref <0.03)

## 2016-04-03 LAB — MAGNESIUM: MAGNESIUM: 2.4 mg/dL (ref 1.7–2.4)

## 2016-04-03 MED ORDER — IPRATROPIUM-ALBUTEROL 0.5-2.5 (3) MG/3ML IN SOLN: 3.0000 mL | Freq: Two times a day (BID) | RESPIRATORY_TRACT | Status: DC

## 2016-04-03 MED ORDER — SODIUM POLYSTYRENE SULFONATE 15 GM/60ML PO SUSP
30.0000 g | Freq: Once | ORAL | Status: AC
Start: 2016-04-03 — End: 2016-04-03
  Administered 2016-04-03: 30 g via ORAL
  Filled 2016-04-03: qty 120

## 2016-04-03 MED ORDER — NOREPINEPHRINE BITARTRATE 1 MG/ML IV SOLN
0.0000 ug/min | INTRAVENOUS | Status: DC
  Administered 2016-04-03: 18 ug/min via INTRAVENOUS
  Administered 2016-04-03: 50 ug/min via INTRAVENOUS
  Filled 2016-04-03 (×2): qty 16

## 2016-04-03 MED ORDER — SODIUM CHLORIDE 0.9 % IV SOLN
1.0000 mg/h | INTRAVENOUS | Status: DC
  Administered 2016-04-03: 1 mg/h via INTRAVENOUS
  Filled 2016-04-03: qty 10

## 2016-04-05 LAB — CULTURE, RESPIRATORY

## 2016-04-05 LAB — CULTURE, RESPIRATORY W GRAM STAIN

## 2016-04-07 LAB — CULTURE, BLOOD (ROUTINE X 2)
CULTURE: NO GROWTH
CULTURE: NO GROWTH

## 2016-04-08 NOTE — Progress Notes (Signed)
Patient time of death occurred at 51.  Pronounced by Carleene Cooper RN and Zenovia Jordan RN.  No heart beat auscultated or palpated for one minute.  No respiratory effort after ventilator turned off.

## 2016-04-08 NOTE — Progress Notes (Signed)
Bedside EEG completed, results pending. 
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
## 2016-04-08 NOTE — Progress Notes (Signed)
Dr. Elsworth Soho made aware of critical lactic acid of 6.4, result is trending down. No new orders at this time.
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
## 2016-04-08 NOTE — Progress Notes (Signed)
Dr. Lake Bells notified that levophed requirements have increased.  Will continue to monitor pt closely.

## 2016-04-08 NOTE — Procedures (Signed)
Electroencephalogram (EEG) Report  Date of study: 04/07/2016  Requesting clinician: Hayden Pedro, NP  Reason for study: Evaluate for seizure  Brief clinical history: 45-yo woman admitted following cardiac arrest. She has remained comatose and is now in the ICU on the hypothermia protocol. EEG is being performed for further evaluation.   Medications:  Current Facility-Administered Medications:  .  albuterol (PROVENTIL) (2.5 MG/3ML) 0.083% nebulizer solution 2.5 mg, 2.5 mg, Nebulization, Q4H PRN, Omar Person, NP, 2.5 mg at 03/25/2016 1839 .  artificial tears (LACRILUBE) ophthalmic ointment 1 application, 1 application, Both Eyes, Q8H, Omar Person, NP, 1 application at 81/85/63 [phone] .  ceFEPIme (MAXIPIME) 1 g in dextrose 5 % 50 mL IVPB, 1 g, Intravenous, Q24H, Otilio Miu, RPH .  chlorhexidine gluconate (MEDLINE KIT) (PERIDEX) 0.12 % solution 15 mL, 15 mL, Mouth Rinse, BID, Praveen Mannam, MD, 15 mL at April 07, 2016 0800 .  [COMPLETED] cisatracurium (NIMBEX) bolus via infusion 5 mg, 0.1 mg/kg, Intravenous, Once, 5 mg at 03/08/2016 1835 **AND** cisatracurium (NIMBEX) 200 mg in sodium chloride 0.9 % 200 mL (1 mg/mL) infusion, 1-1.5 mcg/kg/min, Intravenous, Continuous, Last Rate: 3 mL/hr at 07-Apr-2016 0800, 1 mcg/kg/min at April 07, 2016 0800 **AND** cisatracurium (NIMBEX) bolus via infusion 2.5 mg, 0.05 mg/kg, Intravenous, PRN, Omar Person, NP .  etomidate (AMIDATE) injection, , Intravenous, PRN, Gareth Morgan, MD, 20 mg at 04/01/2016 1340 .  fentaNYL (SUBLIMAZE) 2,500 mcg in sodium chloride 0.9 % 250 mL (10 mcg/mL) infusion, 100-300 mcg/hr, Intravenous, Continuous, Omar Person, NP, Last Rate: 10 mL/hr at 04-07-2016 0800, 100 mcg/hr at 2016/04/07 0800 .  fentaNYL (SUBLIMAZE) bolus via infusion 25 mcg, 25 mcg, Intravenous, Q30 min PRN, Omar Person, NP .  insulin aspart (novoLOG) injection 0-9 Units, 0-9 Units, Subcutaneous, Q4H, Colbert Coyer, MD, 3 Units at 04/07/16  0830 .  ipratropium-albuterol (DUONEB) 0.5-2.5 (3) MG/3ML nebulizer solution 3 mL, 3 mL, Nebulization, Q6H, Omar Person, NP, 3 mL at 04/07/16 0827 .  MEDLINE mouth rinse, 15 mL, Mouth Rinse, 10 times per day, Praveen Mannam, MD, 15 mL at 2016-04-07 1000 .  midazolam (VERSED) 50 mg in sodium chloride 0.9 % 50 mL (1 mg/mL) infusion, 1-4 mg/hr, Intravenous, Continuous, Juanito Doom, MD, Last Rate: 1 mL/hr at 04-07-2016 1106, 1 mg/hr at 2016/04/07 1106 .  norepinephrine (LEVOPHED) 16 mg in dextrose 5 % 250 mL (0.064 mg/mL) infusion, 0-50 mcg/min, Intravenous, Titrated, Praveen Mannam, MD, Last Rate: 28.1 mL/hr at 07-Apr-2016 0915, 30 mcg/min at 04-07-2016 0915 .  sucralfate (CARAFATE) 1 GM/10ML suspension 1 g, 1 g, Per Tube, BID, Kara Mead V, MD, 1 g at 04-07-16 1029 .  [START ON 04/04/2016] vancomycin (VANCOCIN) 500 mg in sodium chloride 0.9 % 100 mL IVPB, 500 mg, Intravenous, Q48H, Otilio Miu, [facility]  Description: This is a routine EEG performed using standard international 10-20 electrode placement. A total of 18 channels are recorded, including one for the EKG. She is comatose in the ICU. She is intubated on propofol, fentanyl, and nimbex. Temperature at the time of the recording is 32.8C.   Activating Maneuvers: None  Findings:  The EKG channel demonstrates a regular rhythm with a rate of 100 beats per minute.   This recording demonstrates burst suppression. The bursts consist primarily of delta activity averaging 1-3 Hz. There is a lesser degree of intermixed theta. She has about one burst every 10 seconds on average. There is no spontaneous reactivity and shows no reactivity to stimulation.   There are no focal  asymmetries. No epileptiform discharges are present. No seizures are recorded.    Impression:  This is a markedly abnormal EEG due to the presence of burst suppression. No epileptiform activity or seizures are present.   Clinical correlation: This EEG appearance indicates  severe global cerebral dysfunction. This can be seen with severe anoxic brain injury. However, in this case, it must be interpreted with caution given that the patient is heavily sedated and hypothermic as these can produce similar findings. This study is of little prognostic value. If warranted, consider repeating the study once the patient is normothermic and off of sedation.    Melba Coon, MD [facility]
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
## 2016-04-08 DEATH — deceased

## 2016-04-14 ENCOUNTER — Ambulatory Visit: Payer: Medicare Other | Admitting: Podiatry

## 2016-04-26 ENCOUNTER — Ambulatory Visit: Payer: Medicare Other | Admitting: Internal Medicine

## 2016-05-06 NOTE — Discharge Summary (Signed)
[facility] Death Ntes:  Cause of death: HCAP  Date of death 04/11/2016  Brief HPI and hospital course.  This is an 83 y/o female who came in from a nursing home with cardiac arrest.  She had been experiencing several weeks of cough and congestion prior and had recently been relocated to a nursing home for rehabilitation. She was admitted to the ICU on full mechanical ventilatory support receiving IV vasopressors for blood pressure support.  The therapeutic hypothermia protocol was initiated.  After admission she had progressive shock which was felt to be cardiogenic or septic in the setting of healthcare associated pneumonia.  On April 11, 2016 she developed severe shock and conversations with her family were held regarding her poor prognosis given her advanced age and multi-organ failure. The patient's family elected to not escalate care further and her blood pressure continued to fall despite vasopressor support.  She passed peacefully with her family at the bedside.  Roselie Awkward, MD [facility] Pager: [phone] Cell: [phone] After 3pm or if no response, call [phone]

## 2016-05-27 ENCOUNTER — Ambulatory Visit: Payer: Medicare Other | Admitting: Neurology
